# Patient Record
Sex: Male | Born: 1935 | Race: White | Hispanic: No | Marital: Married | Smoking: Never smoker
Health system: Southern US, Community
[De-identification: ages and names within clinical notes are randomized; demographics above are authoritative.]

## PROBLEM LIST (undated history)

## (undated) DIAGNOSIS — H409 Unspecified glaucoma: Secondary | ICD-10-CM

## (undated) DIAGNOSIS — I10 Essential (primary) hypertension: Secondary | ICD-10-CM

## (undated) DIAGNOSIS — K449 Diaphragmatic hernia without obstruction or gangrene: Secondary | ICD-10-CM

## (undated) DIAGNOSIS — C4441 Basal cell carcinoma of skin of scalp and neck: Secondary | ICD-10-CM

## (undated) DIAGNOSIS — I2510 Atherosclerotic heart disease of native coronary artery without angina pectoris: Secondary | ICD-10-CM

## (undated) DIAGNOSIS — C9110 Chronic lymphocytic leukemia of B-cell type not having achieved remission: Secondary | ICD-10-CM

## (undated) DIAGNOSIS — Z800 Family history of malignant neoplasm of digestive organs: Secondary | ICD-10-CM

## (undated) DIAGNOSIS — Z862 Personal history of diseases of the blood and blood-forming organs and certain disorders involving the immune mechanism: Secondary | ICD-10-CM

## (undated) DIAGNOSIS — K635 Polyp of colon: Secondary | ICD-10-CM

## (undated) DIAGNOSIS — F4310 Post-traumatic stress disorder, unspecified: Secondary | ICD-10-CM

## (undated) DIAGNOSIS — M1990 Unspecified osteoarthritis, unspecified site: Secondary | ICD-10-CM

## (undated) DIAGNOSIS — M21371 Foot drop, right foot: Secondary | ICD-10-CM

## (undated) DIAGNOSIS — Z8042 Family history of malignant neoplasm of prostate: Secondary | ICD-10-CM

## (undated) DIAGNOSIS — I219 Acute myocardial infarction, unspecified: Secondary | ICD-10-CM

## (undated) DIAGNOSIS — K828 Other specified diseases of gallbladder: Secondary | ICD-10-CM

## (undated) DIAGNOSIS — Z77098 Contact with and (suspected) exposure to other hazardous, chiefly nonmedicinal, chemicals: Secondary | ICD-10-CM

## (undated) DIAGNOSIS — E611 Iron deficiency: Secondary | ICD-10-CM

## (undated) DIAGNOSIS — C189 Malignant neoplasm of colon, unspecified: Secondary | ICD-10-CM

## (undated) DIAGNOSIS — K219 Gastro-esophageal reflux disease without esophagitis: Secondary | ICD-10-CM

## (undated) DIAGNOSIS — D649 Anemia, unspecified: Secondary | ICD-10-CM

## (undated) DIAGNOSIS — IMO0002 Reserved for concepts with insufficient information to code with codable children: Secondary | ICD-10-CM

## (undated) DIAGNOSIS — L719 Rosacea, unspecified: Secondary | ICD-10-CM

## (undated) DIAGNOSIS — K627 Radiation proctitis: Secondary | ICD-10-CM

## (undated) DIAGNOSIS — C61 Malignant neoplasm of prostate: Secondary | ICD-10-CM

## (undated) DIAGNOSIS — E538 Deficiency of other specified B group vitamins: Secondary | ICD-10-CM

## (undated) DIAGNOSIS — K3184 Gastroparesis: Secondary | ICD-10-CM

## (undated) DIAGNOSIS — R002 Palpitations: Secondary | ICD-10-CM

## (undated) DIAGNOSIS — R269 Unspecified abnormalities of gait and mobility: Secondary | ICD-10-CM

## (undated) DIAGNOSIS — Z7739 Contact with and (suspected) exposure to other war theater: Secondary | ICD-10-CM

## (undated) DIAGNOSIS — G629 Polyneuropathy, unspecified: Secondary | ICD-10-CM

## (undated) DIAGNOSIS — Z87442 Personal history of urinary calculi: Secondary | ICD-10-CM

## (undated) HISTORY — PX: TRANSURETHRAL RESECTION OF PROSTATE: SHX73

## (undated) HISTORY — DX: Personal history of diseases of the blood and blood-forming organs and certain disorders involving the immune mechanism: Z86.2

## (undated) HISTORY — DX: Contact with and (suspected) exposure to other hazardous, chiefly nonmedicinal, chemicals: Z77.098

## (undated) HISTORY — PX: PROSTATECTOMY: SHX69

## (undated) HISTORY — DX: Essential (primary) hypertension: I10

## (undated) HISTORY — DX: Post-traumatic stress disorder, unspecified: F43.10

## (undated) HISTORY — DX: Chronic lymphocytic leukemia of B-cell type not having achieved remission: C91.10

## (undated) HISTORY — PX: KNEE SURGERY: SHX244

## (undated) HISTORY — DX: Family history of malignant neoplasm of prostate: Z80.42

## (undated) HISTORY — DX: Malignant neoplasm of prostate: C61

## (undated) HISTORY — DX: Gastroparesis: K31.84

## (undated) HISTORY — DX: Gastro-esophageal reflux disease without esophagitis: K21.9

## (undated) HISTORY — DX: Other specified diseases of gallbladder: K82.8

## (undated) HISTORY — DX: Diaphragmatic hernia without obstruction or gangrene: K44.9

## (undated) HISTORY — DX: Family history of malignant neoplasm of digestive organs: Z80.0

## (undated) HISTORY — DX: Polyp of colon: K63.5

## (undated) HISTORY — DX: Basal cell carcinoma of skin of scalp and neck: C44.41

## (undated) HISTORY — DX: Deficiency of other specified B group vitamins: E53.8

## (undated) HISTORY — DX: Contact with and (suspected) exposure to other war theater: Z77.39

## (undated) HISTORY — DX: Reserved for concepts with insufficient information to code with codable children: IMO0002

## (undated) HISTORY — DX: Radiation proctitis: K62.7

## (undated) HISTORY — DX: Iron deficiency: E61.1

## (undated) HISTORY — DX: Rosacea, unspecified: L71.9

## (undated) HISTORY — PX: CHOLECYSTECTOMY: SHX55

## (undated) HISTORY — DX: Atherosclerotic heart disease of native coronary artery without angina pectoris: I25.10

## (undated) HISTORY — PX: TONSILLECTOMY: SUR1361

## (undated) HISTORY — PX: CATARACT EXTRACTION: SUR2

## (undated) HISTORY — DX: Palpitations: R00.2

## (undated) HISTORY — DX: Malignant neoplasm of colon, unspecified: C18.9

## (undated) HISTORY — DX: Unspecified osteoarthritis, unspecified site: M19.90

## (undated) HISTORY — DX: Unspecified abnormalities of gait and mobility: R26.9

## (undated) HISTORY — PX: VASECTOMY: SHX75

---
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
## 2003-01-31 ENCOUNTER — Other Ambulatory Visit: Admission: RE | Admit: 2003-01-31 | Discharge: 2003-01-31 | Payer: Self-pay | Admitting: Dermatology

## 2003-11-25 ENCOUNTER — Encounter: Admission: RE | Admit: 2003-11-25 | Discharge: 2003-11-25 | Payer: Self-pay | Admitting: Neurology

## 2004-02-12 ENCOUNTER — Ambulatory Visit (HOSPITAL_COMMUNITY): Admission: RE | Admit: 2004-02-12 | Discharge: 2004-02-12 | Payer: Self-pay | Admitting: Internal Medicine

## 2004-02-12 HISTORY — PX: COLONOSCOPY: SHX174

## 2004-02-12 HISTORY — PX: ESOPHAGOGASTRODUODENOSCOPY: SHX1529

## 2005-04-26 ENCOUNTER — Ambulatory Visit: Payer: Self-pay | Admitting: Internal Medicine

## 2005-04-27 ENCOUNTER — Ambulatory Visit (HOSPITAL_COMMUNITY): Admission: RE | Admit: 2005-04-27 | Discharge: 2005-04-27 | Payer: Self-pay | Admitting: Internal Medicine

## 2005-05-10 ENCOUNTER — Ambulatory Visit (HOSPITAL_COMMUNITY): Admission: RE | Admit: 2005-05-10 | Discharge: 2005-05-10 | Payer: Self-pay | Admitting: Internal Medicine

## 2005-05-25 ENCOUNTER — Ambulatory Visit: Payer: Self-pay | Admitting: Internal Medicine

## 2005-06-24 ENCOUNTER — Ambulatory Visit: Payer: Self-pay | Admitting: Internal Medicine

## 2005-12-20 ENCOUNTER — Ambulatory Visit: Payer: Self-pay | Admitting: Internal Medicine

## 2006-12-30 ENCOUNTER — Ambulatory Visit: Payer: Self-pay | Admitting: Internal Medicine

## 2008-01-17 ENCOUNTER — Ambulatory Visit: Payer: Self-pay | Admitting: Internal Medicine

## 2008-08-30 DIAGNOSIS — K449 Diaphragmatic hernia without obstruction or gangrene: Secondary | ICD-10-CM

## 2008-08-30 DIAGNOSIS — C61 Malignant neoplasm of prostate: Secondary | ICD-10-CM

## 2008-08-30 HISTORY — DX: Malignant neoplasm of prostate: C61

## 2008-08-30 HISTORY — DX: Diaphragmatic hernia without obstruction or gangrene: K44.9

## 2008-10-08 ENCOUNTER — Ambulatory Visit: Payer: Self-pay | Admitting: Internal Medicine

## 2008-10-10 ENCOUNTER — Ambulatory Visit (HOSPITAL_COMMUNITY): Admission: RE | Admit: 2008-10-10 | Discharge: 2008-10-10 | Payer: Self-pay | Admitting: Internal Medicine

## 2008-10-10 ENCOUNTER — Ambulatory Visit: Payer: Self-pay | Admitting: Internal Medicine

## 2008-10-10 HISTORY — PX: ESOPHAGOGASTRODUODENOSCOPY: SHX1529

## 2008-10-17 ENCOUNTER — Telehealth (INDEPENDENT_AMBULATORY_CARE_PROVIDER_SITE_OTHER): Payer: Self-pay | Admitting: *Deleted

## 2008-10-17 ENCOUNTER — Telehealth (INDEPENDENT_AMBULATORY_CARE_PROVIDER_SITE_OTHER): Payer: Self-pay

## 2008-10-22 ENCOUNTER — Ambulatory Visit: Payer: Self-pay | Admitting: Cardiology

## 2008-11-18 ENCOUNTER — Telehealth: Payer: Self-pay | Admitting: Internal Medicine

## 2008-12-18 ENCOUNTER — Ambulatory Visit: Payer: Self-pay | Admitting: Cardiology

## 2008-12-25 ENCOUNTER — Encounter: Payer: Self-pay | Admitting: Internal Medicine

## 2009-04-30 DIAGNOSIS — I4949 Other premature depolarization: Secondary | ICD-10-CM | POA: Insufficient documentation

## 2009-07-03 ENCOUNTER — Ambulatory Visit: Admission: RE | Admit: 2009-07-03 | Discharge: 2009-08-29 | Payer: Self-pay | Admitting: Radiation Oncology

## 2009-08-31 ENCOUNTER — Ambulatory Visit: Admission: RE | Admit: 2009-08-31 | Discharge: 2009-10-03 | Payer: Self-pay | Admitting: Radiation Oncology

## 2010-01-01 ENCOUNTER — Encounter (INDEPENDENT_AMBULATORY_CARE_PROVIDER_SITE_OTHER): Payer: Self-pay | Admitting: *Deleted

## 2010-03-09 DIAGNOSIS — K219 Gastro-esophageal reflux disease without esophagitis: Secondary | ICD-10-CM

## 2010-03-12 ENCOUNTER — Ambulatory Visit: Payer: Self-pay | Admitting: Internal Medicine

## 2010-03-20 ENCOUNTER — Telehealth (INDEPENDENT_AMBULATORY_CARE_PROVIDER_SITE_OTHER): Payer: Self-pay | Admitting: *Deleted

## 2010-06-25 ENCOUNTER — Ambulatory Visit (HOSPITAL_COMMUNITY): Admission: RE | Admit: 2010-06-25 | Discharge: 2010-06-25 | Payer: Self-pay | Admitting: Ophthalmology

## 2010-08-30 DIAGNOSIS — I219 Acute myocardial infarction, unspecified: Secondary | ICD-10-CM

## 2010-08-30 DIAGNOSIS — I2510 Atherosclerotic heart disease of native coronary artery without angina pectoris: Secondary | ICD-10-CM

## 2010-08-30 HISTORY — DX: Atherosclerotic heart disease of native coronary artery without angina pectoris: I25.10

## 2010-08-30 HISTORY — DX: Acute myocardial infarction, unspecified: I21.9

## 2010-09-29 NOTE — Letter (Signed)
Summary: Recall Office Visit  [facility]  [address]   [place], [place] [address]   Phone: [phone]  Fax: [phone]      Jan 01, 2010   David Pugh [address] [place], [place]  [address] 03/02/1935   Dear [name],   According to our records, it is time for you to schedule a follow-up office visit with [place].   At your convenience, please call [phone] to schedule an office visit. If you have any questions, concerns, or feel that this letter is in error, we would appreciate your call.   Sincerely,    Diana Eves  [facility] Ph: [phone]   Fax: [phone]

## 2010-09-29 NOTE — Progress Notes (Signed)
  Phone Note Call from Patient   Reason for Call: Talk to Nurse Summary of Call: Pt called and spoke with Erskine Squibb. He told her that the samples were working well for him and should he continue and have a Rx called in? Erskine Squibb told pt that we would get with RMR and for him to advise to continue or not. Pt said he uses [facility] in [place]. Initial call taken by: Diana Eves,  March 20, 2010 10:47 AM     Appended Document:  pt is taking dexilant samples  Appended Document:  ok; rx Dexilant 60 mg disp 30 or 90  ; onc capsule daily  - refill x 1 year  Appended Document:  rx called to [facility]  Appended Document:  LMOM for pt

## 2010-09-29 NOTE — Assessment & Plan Note (Signed)
Summary: 2 YR FU/GERD/SS   Visit Type:  Follow-up Visit Primary Care Provider:  tapper  Chief Complaint:  2 year follow up- has nausea prior to eating.  History of Present Illness: Patient doing well. Typical reflux symptoms well-controlled on Nexium. He has developed intermittent symptoms of hunger pangs and knawing epigastric pain relieved by eating lunch or supper. Hasn't had any nausea or vomiting. No dysphagia. No melena or rectal bleeding. EGD February 2010 small hiatal hernia. Gallbladder is out-biliary dyskinesia. Colonoscopy 2005 inflammatory polyps. Due for routine screening 2015. He has gained 16 pounds since February 2010. History of prostate cancer GERD status post TURP. Radiation therapy almost completed down at [facility].  Patient does take Meloxicam daily for osteoarthritis.  Current Medications (verified): 1)  Nexium 40 Mg Cpdr (Esomeprazole Magnesium) .... Take 1 Tablet By Mouth Once A Day 2)  Losartan Potassium-Hctz 100-25 Mg Tabs (Losartan Potassium-Hctz) .... Take 1 Tablet By Mouth Once A Day 3)  Daily Multiple Vitamins  Tabs (Multiple Vitamin) .... Take 1 Tablet By Mouth Once A Day 4)  Meloxicam 15 Mg Tabs (Meloxicam) .... Take 1 Tablet By Mouth Once A Day 5)  Xanax 0.5 Mg Tabs (Alprazolam) .... As Needed 6)  Metoprolol Tartrate 50 Mg Tabs (Metoprolol Tartrate) .... Two Times A Day 7)  Citalopram Hydrobromide 10 Mg Tabs (Citalopram Hydrobromide) .... Once Daily 8)  Xalatan 0.005 % Soln (Latanoprost) .... Once Daily 9)  B Complex With Folic Acid and Vit C .... Once Daily 10)  Aspirin 81 Mg Tbec (Aspirin) .... Once Daily  Allergies (verified): 1)  ! Sulfa  Past History:  Past Surgical History: Last updated: 04/30/2009 Back Surgery Cholecystectomy Prostatectomy Tonsillectomy  Family History: Last updated: 2010/03/21 Father: deceased- throat cancer, hx of MI Mother: deceased- old age Siblings: brother- cancer-prostate No FH of Colon Cancer:  Social  History: Last updated: 04/30/2009 Retired  Married  Tobacco Use - No.  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 Conjunctiva are pink Lungs:  clear to auscultation Heart:  regular rate rhythm without murmur gallop rub Abdomen:  flat positive bowel sounds soft nontender without appreciable mass or organomegaly.  Impression & Recommendations: Impression: 76 y/o gentleman with well-controlled GERD on Nexium now with intermittent hunger pains some vague dyspeptic symptoms relieved with eating. He does take nonsteroidals daily. I do not detect any alarm symptoms at this time.  Recommendations: Stop Nexium; course of Dexalant 60 mg orally daily for the next 2 weeks one before breakfast daily. He is to let us know how is working form in 2 weeks and we will go from there.  May need a drug holiday off meloxicam.  Appended Document: Orders Update  Clinical Lists Changes  Orders: Added new Service order of Est. Patient Level III (30160) - Signed

## 2010-11-11 LAB — HEMOGLOBIN AND HEMATOCRIT, BLOOD: HCT: 40 % (ref 39.0–52.0)

## 2010-11-11 LAB — BASIC METABOLIC PANEL
CO2: 27 mEq/L (ref 19–32)
Calcium: 9 mg/dL (ref 8.4–10.5)
Creatinine, Ser: 1.23 mg/dL (ref 0.4–1.5)
GFR calc Af Amer: >60 mL/min (ref >60)

## 2011-01-12 NOTE — Assessment & Plan Note (Signed)
NAME:  RIYAAN, HEROUX              CHART#:  [id number]   DATE:  01/17/2008                       DOB:  09/11/1934   REASON FOR VISIT:  Annual follow-up of gastroesophageal reflux disease,  history of biliary dyskinesia, last seen Dec 30, 2006.   HISTORY OF PRESENT ILLNESS:  Mr. Disano has done very well since he  was last seen.  Reflux symptoms are now well controlled on Nexium 40  grams orally daily.  He is not having any dysphagia, no melena,  hematochezia.  He is trying to watch his diet.  He has gained 10 pounds,  however, in the past one year.  He has a history of some vague post  prandial upper abdominal pain for which he was worked up with ultrasound  at [facility].  He has a gallbladder EF of 31%, but overall clinically has  done well for the better part of a year and a half, now without any  recurrent symptoms.  Colonoscopy back in 2005 demonstrated colonic  polyps which were removed.  They turned out all to be inflammatory.  He  is slated to have routine screening colonoscopy in 2015.   CURRENT MEDICATIONS:  See updated list.   ALLERGIES:  Sulfa .   PHYSICAL EXAMINATION:  GENERAL:  Mr. Kuennen appears well.  VITAL SIGNS:  Weight 230, height 5 feet 11-1/2 inches,  temperature 95,  blood pressure 120/70, pulse 64.  SKIN:  Warm and dry.  CHEST:  Lungs are clear to auscultation.  HEART:  Regular rate and rhythm without murmur, gallop, rub.  ABDOMEN:  Flat, positive bowel sounds, entirely soft, nontender without  appreciable mass or organomegaly.   ASSESSMENT:  1. Current symptoms well controlled on Nexium.  Overall, he is doing      well.  He has gained 10 pounds.  I reviewed antireflux      lifestyle/diet, and he ought to plan on just continuing Nexium      daily indefinitely as the benefits far outweigh theoretical risks.  2. Biliary dyskinesia, asymptomatic.  No further intervention needed      as long as he is asymptomatic.  3. Colorectal cancer screening.  He will be  due for a screening      examination in 2015.  Unless something comes up, plan to see this      nice gentleman back in 2 years.       Jonathon Bellows, M.D.  Electronically Signed     RMR/MEDQ  D:  01/17/2008  T:  01/17/2008  Job:  [id number]

## 2011-01-12 NOTE — Op Note (Signed)
NAME:  David, Pugh             ACCOUNT NO.:  [id number]   MEDICAL RECORD NO.:  [id number]          PATIENT TYPE:  AMB   LOCATION:  DAY                           FACILITY:  [facility]   PHYSICIAN:  R. Roetta Sessions, M.D. DATE OF BIRTH:  Oct 30, 1934   DATE OF PROCEDURE:  DATE OF DISCHARGE:                               OPERATIVE REPORT   INDICATIONS FOR PROCEDURE:  A 76 year old gentleman with a history of  biliary dyskinesia (gallbladder remains in situ) who is now having  recurrent symptoms of vague postprandial upper abdominal discomfort and  anorexia recently.  He had similar symptoms in the past, which resolved  with conservative approach and gallbladder ultrasound was negative.  However, HIDA with the gallbladder EF demonstrated diminished  gallbladder ejection fraction of 31%.  Reflux symptoms are now fairly  well controlled on Nexium 40 mg orally daily.  He had some transient  constipation recently, which he states this week has much improved  without any specific therapy.  EGD is now being done to further evaluate  his upper GI tract symptoms.  Risks, benefits, alternatives, and  limitations have been reviewed.  Questions answered.  He is agreeable.  Please see the documentation in the medical record.   PROCEDURE NOTE:  O2 saturation, blood pressure, pulse, and respirations  were monitored throughout the entire procedure.   CONSCIOUS SEDATION:  Versed 4 mg IV and Demerol 100 mg IV in divided  doses.   INSTRUMENT:  Pentax video chip system.   FINDINGS:  Examination of tubular esophagus revealed no mucosal  abnormalities.  EG junction easily traversed.  Stomach:  Gastric cavity  was empty and insufflated well with air.  Thorough examination of the  gastric mucosa including retroflexion of the proximal stomach,  esophagogastric junction demonstrated only a tiny hiatal hernia.  Pylorus was patent and easily traversed.  Examination of the bulb and  second portion revealed no  abnormalities.   THERAPEUTIC/DIAGNOSTIC MANEUVERS PERFORMED:  None.   The patient tolerated the procedure well and was reactive to Endoscopy.   IMPRESSION:  Normal esophagus, tiny hiatal hernia, otherwise normal  stomach, D1 and D2.   I suspect the patient's upper gastrointestinal tract symptoms are most  likely related to his gallbladder.  At this point, I feel no further  diagnostic studies are warranted.  Again, I have recommended Mr.  Egnor go ahead and seek out surgical consult.  Additionally, I told  him that he may will get further studies, since it has been a good 3  years since his HIDA was last done, but we will defer it to surgical  consultant.   [name] tells me he wants to be referred over to [facility] surgery  and we will take the liberty of getting the ball moved in that  direction.      Jonathon Bellows, M.D.  Electronically Signed     RMR/MEDQ  D:  10/10/2008  T:  10/11/2008  Job:  [id number]   cc:   Wyvonnia Lora  Fax: [phone]   [facility]  [place], [place]

## 2011-01-12 NOTE — H&P (Signed)
NAME:  David Pugh, David Pugh             ACCOUNT NO.:  [id number]   MEDICAL RECORD NO.:  [id number]          PATIENT TYPE:  AMB   LOCATION:  DAY                           FACILITY:  [facility]   PHYSICIAN:  R. Roetta Sessions, M.D. DATE OF BIRTH:  [birth year]   DATE OF ADMISSION:  DATE OF DISCHARGE:  LH                              HISTORY & PHYSICAL   CHIEF COMPLAINT:  Early satiety, upper abdominal pain, weight loss.   HISTORY OF PRESENT ILLNESS:  David Pugh is a pleasant 76-year-  old Caucasian male with a history of known biliary dyskinesia and  gastroesophageal reflux disease, symptoms of have been well controlled.  Over the last couple of years, he is taking Nexium with very good  management of his reflux symptoms.  He tells me over the past 3 months,  he started having some vague symptoms of anorexia getting full early,  upper abdominal discomfort after he eats particularly salads including  lettuce.  He has a notable difficult time after he eats lettuce.  He has  not had fever or chills.  He has had some nocturnal symptoms that he  describes as reflux/regurgitation.  He has also been more constipated  recently.   These symptoms have been associated with a 13-pound weight loss in the  past 8 weeks.  He tells me he leaves off his evening meal.  He does not  feel very well and also he has developed relative constipation during  this period of time.  His stool frequency has fallen off and sometimes  he has to strain, although, he denies melena or hematochezia.  Of note,  he has also been taking meloxicam for arthritis over the last few years.   He has no odynophagia or dysphagia.  He has had some vague upper  abdominal discomfort in years past which has been evaluated with  abdominal ultrasound which reveals normal gall gallbladder biliary tree,  some increased echogenicity of the liver consistent with fatty  infiltration.  HIDA with CCK challenge demonstrated diminished  gallbladder, EF of 31.2% back in 2006, but this was not associated with  any symptoms, whatsoever.   Past medical history is significant for hypertension, seasonal  allergies, fluid retention, gastroesophageal reflux disease, chronic low  back pain.   PAST SURGERIES:  Prostate surgery, BPH, back surgery, tonsillectomy.  He  underwent a screening colonoscopy back in June 2005.  He had multiple  polyps removed as well as diverticulosis all the polyps were  inflammatory and he is due for followup colonoscopy in 2015.   CURRENT MEDICATIONS:  1. Meloxicam 15 mg daily.  2. Atenolol 25 mg daily.  3. Hyzaar 100/25 daily.  4. Nexium 40 mg daily.  5. Centrum Silver daily.  6. Trazodone 50 mg daily.  7. Liquid red balm applied to his maxillary area daily to prevent      sinusitis and colds.   ALLERGIES:  SULFA.   FAMILY HISTORY:  Negative chronic GI or liver illness.  Father succumbed  to throat cancer, he was a heavy smoker.   SOCIAL HISTORY:  The patient had been married for  45 years, has 3 sons.  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  No tobacco or alcohol.   REVIEW OF SYSTEMS:  No recent chest pain, dyspnea on exertion, fever,  chills.  He has lost weight as outlined above, otherwise as in history  of present illness.   PHYSICAL EXAMINATION:  GENERAL:  A pleasant 76 year old gentleman who  appears to be in no acute distress at this time.  VITAL SIGNS:  Weight is 219 he was 232 when he last weighed in Jan 17, 2008, height 5 feet 11-1/2 inches, temp 98.6, BP 110/70, pulse 60.  SKIN:  Warm and dry.  No jaundice.  No cutaneous stigmata of chronic  liver disease.  HEENT:  No scleral icterus.  Conjunctivae are pink.  CHEST:  Lungs are clear to auscultation.  CARDIAC:  Regular rate and rhythm without murmur, gallop, or rub.  ABDOMEN:  Nondistended.  Positive bowel sounds, soft, nontender, and  nontender to deep palpation.  No appreciable mass  or hepatosplenomegaly.  EXTREMITIES:  No edema.   IMPRESSION:  David Pugh is a very pleasant 76 year old  gentleman with longstanding well-controlled gastroesophageal reflux  disease symptoms.  He has now had some vague postprandial upper  abdominal discomfort with episodes of regurgitation in some early  satiety/anorexia associated with 13-pound weight loss.   His symptoms now are not too dissimilar from symptoms back in 2005 and  2006, which I suspect are ultimately going to be found to be related to  his gallbladder.  However, given his significant early satiety with  longstanding nonsteroidal agent use and weight loss, I feel I firstly  need to reevaluate his upper GI tract prior to considering gallbladder  disease further.   His change in bowel habits are most likely related to his significantly  diminished oral intake over the last several weeks.   RECOMMENDATIONS:  We will proceed with the diagnostic EGD as soon as Pugh  be arranged unless significant pathology is found.  I think there is no  information clinically to make a surgical referral for cholecystectomy  for bowel function, and I have recommended try some MiraLax 1 capful in  8 ounces of water at bedtime p.r.n. constipation with further  recommendations in the very near future.      Jonathon Bellows, M.D.  Electronically Signed    RMR/MEDQ  D:  10/08/2008  T:  10/09/2008  Job:  [id number]   cc:   Wyvonnia Lora  Fax: [phone]

## 2011-01-12 NOTE — Assessment & Plan Note (Signed)
[facility]                          [facility] OFFICE NOTE   NAME:David Pugh, David Pugh                    MRN:          [id number]  DATE:12/18/2008                            DOB:          1934-11-10    PRIMARY CARE PHYSICIAN:  Dr. Wyvonnia Lora   REASON FOR VISIT:  Post hospital followup.   HISTORY OF PRESENT ILLNESS:  David Pugh was seen as an inpatient  consult back in February of this year.  At that time, he had undergone  an elective laparoscopic cholecystectomy by Dr. Sherin Quarry and was noted  to have premature ventricular complexes in the perioperative setting.  These were not symptom provoking and by telemetry monitoring over night,  he did not exhibit any sustained dysrhythmias.  Echocardiography was  reassuring showing a normal left ventricular ejection fraction of 55-60%  with diastolic dysfunction, mild left atrial enlargement, aortic valve  sclerosis without stenosis, trace mitral regurgitation and normal right  ventricular systolic pressure.  No pericardial effusion was noted.  [name] did not describe any clear exertional symptoms of dizziness or  syncope at that point and no further investigation was undertaken from a  cardiac perspective.  My understanding is that since that time he has  undergone a TURP in March, and was subsequently diagnosed with prostate  cancer.  He has been suffering from a significant amount of anxiety  related to this and is being considered for the possibility of surgery  versus radiation therapy over the next several months.  From a cardiac  perspective, he denies any sense of palpitations, dizziness, or syncope.  Prior to January, he was exercising regularly without any major  limitations.  He has not been regular since then, but for a brief period  of time in March had gotten back to doing his elliptical machine and sit-  ups without angina or dyspnea on exertion.   ALLERGIES:  SULFA DRUGS.   PRESENT MEDICATIONS:  1. Nexium 40 mg p.o. daily.  2. Potassium 10 mEq p.o. daily.  3. Losartan/hydrochlorothiazide 100/25 mg p.o. daily.  4. Multivitamin one p.o. daily.  5. Atenolol 25 mg p.o. daily.  6. Meloxicam 50 mg p.o. daily.  7. Xanax 3.5 mg p.o. p.r.n.   REVIEW OF SYSTEMS:  As outlined above.  Negative.   PHYSICAL EXAMINATION:  VITAL SIGNS:  Blood pressure is 150/77, heart  rate is 67, weight is 207 pounds.  GENERAL:  This is an overweight male in no acute distress.  HEENT:  Conjunctiva is normal.  Oropharynx clear.  NECK:  Supple.  No elevated jugular venous pressure, no bruits.  LUNGS:  Clear, breathing at rest.  CARDIAC:  Regular rate and rhythm.  No S3 gallop or pericardial rub.  ABDOMEN:  Soft, nontender.  Normoactive bowel sounds.  EXTREMITIES:  Exhibit no significant pitting edema.  Distal pulses are  2+.  SKIN:  Warm and dry.  MUSCULOSKELETAL:  No kyphosis noted.  NEUROPSYCHIATRIC:  The patient is alert and oriented x3.  Affect is  appropriate   IMPRESSION AND RECOMMENDATIONS:  Previous documentation of asymptomatic  premature ventricular complexes in the  setting of normal left  ventricular systolic function and no exertional chest pain or limiting  breathlessness.  David Pugh is now on a low-dose potassium supplement  while on losartan/hydrochlorothiazide.  He is not reporting any  palpitations or functional limitation at this point and mainly seems to  have had difficulty with anxiety recently in the wake of his prostate  cancer diagnosis.  At this point, I have recommended that he reinitiate  a basic aerobic exercise regiment, perhaps through walking or use of his  elliptical machine.  He will continue on low-dose beta-blocker therapy  as well.  No further ischemic testing to be pursued at this point unless  he manifests any progressive symptoms.  We will plan to see him back for  observation over the next 6 months.     Jonelle Sidle, MD   Electronically Signed    SGM/MedQ  DD: 12/18/2008  DT: 12/19/2008  Job #: [id number]   cc:   Wyvonnia Lora

## 2011-01-15 NOTE — Op Note (Signed)
NAME:  David Pugh, David Pugh                       ACCOUNT NO.:  [id number]   MEDICAL RECORD NO.:  [id number]                   PATIENT TYPE:  AMB   LOCATION:  DAY                                  FACILITY:  [facility]   PHYSICIAN:  R. Roetta Sessions, M.D.              DATE OF BIRTH:  [birth year]   DATE OF PROCEDURE:  02/12/2004  DATE OF DISCHARGE:                                 OPERATIVE REPORT   PROCEDURE:  Esophagogastroduodenoscopy and colonoscopy and snare  polypectomy.   INDICATIONS FOR PROCEDURE:  The patient is a 76 year old gentleman who has  never had a colonoscopy who has had unbreakable reflux symptoms after being  on Aciphex 20 mg a day for 10 years.  He has had a sensation of some  fullness below his Adam's apple but really has not had any true esophageal  dysphagia or odynophagia.  EGD and colonoscopy are done.  This approach has  been discussed with the patient at length.  Potential risks, benefits, and  alternatives have been reviewed, questions answered, he is agreeable.  Please see my H&P.   PROCEDURE NOTE:  O2 saturation, blood pressure, pulse and respirations were  monitored throughout the entirety of both procedures.   CONSCIOUS SEDATION:  Versed 5 mg IV, Demerol 125 mg IV.  Cetacaine spray for  topical pharyngeal anesthesia.   ESOPHAGOGASTRODUODENOSCOPY FINDINGS:  Examination of the tubular esophagus  revealed no mucosal abnormalities.  The EG junction easily traversed and the  stomach and gastric cavity was empty and insufflated well with air.  Thorough examination of the gastric mucosa including retroflexed view of the  proximal stomach, esophagogastric junction demonstrated no abnormalities.  The pylorus was patent and easily traversed.  Examination of the first and  second portion revealed no abnormalities.  No therapeutic/diagnostic  maneuvers:  None.  The patient tolerated the procedure well and was prepared  for colonoscopy.   Digital rectal exam revealed no  abnormalities.   COLONOSCOPIC FINDINGS:  The prep was good.  Rectum:  Examination of the  rectal mucosa including a retroflexed view of the anal verge revealed no  abnormalities.  Colon:  The colonic mucosa was surveyed from the  rectosigmoid junction through the left transverse and right colon to the  area of the appendiceal orifice and ileocecal valve and cecum.  These  structures were well seen and photographed for the record.  From this level,  the scope was slowly withdrawn.  All previously mentioned mucosal surfaces  were again seen.  The patient was then noted to have the following  abnormalities.  1. Left-sided diverticular.  2. An 8 mm donut on its side shaped polyp adjacent to the cecum.  This     lesion was raised away from the colonic wall nicely with saline.  The     first 0.5 cc injection turned the adjacent mucosa somewhat white.  I     learned that I was given  half-normal saline instead of normal saline.  We     switched out to normal saline for an additional 3 cc of submucosal     injection.  This was done without difficulty or apparent complication.     Subsequently, using snare cautery, this lesion was resected with one     pass.  There was a slight amount of oozing which was treated with 2 cc of     dilute epinephrine injected submucosally.  3. A single 7 mm polyp on a stalk just distal to the ileocecal valve removed     with hot snare.  There were three 5 mm polyps in the mid right colon,     either cold or hot snared.  Fragments were recovered.  The remainder of     the colonic mucosa appeared normal.  The patient tolerated both     procedures, was reactive at endoscopy.   IMPRESSION:  1. Normal esophagogastroduodenoscopy.  2. Normal rectum.  The colonoscopy findings:  Normal rectum, left-sided     diverticula.  A sessile polyp in the cecum removed as described above.  A     second polyp just distal to the ileocecal valve removed.  Multiple     smaller polyps in  the mid and right descending colon removed.  Otherwise     normal colonoscopy.   RECOMMENDATIONS:  1. Stop Aciphex, begin Prevacid 30 mg orally daily, antireflux literature     provided to Mr. Tsang.  2. He is admonished not to take any Advil or aspirin-type products for the     next 10 days.  3. Diverticulosis literature provided to Mr. Ruben.  4. Follow up on pathology.  5. Further recommendations to follow.      ___________________________________________                                            Jonathon Bellows, M.D.   RMR/MEDQ  D:  02/12/2004  T:  02/12/2004  Job:  [id number]   cc:   Wyvonnia Lora  [address]  [place]  [place] [address]  Fax: [phone]   R. Roetta Sessions, M.D.  [address]  [place]  [place] [address]  Fax: [phone]

## 2011-01-29 HISTORY — PX: CORONARY STENT PLACEMENT: SHX1402

## 2011-02-12 ENCOUNTER — Inpatient Hospital Stay (HOSPITAL_COMMUNITY)
Admission: AD | Admit: 2011-02-12 | Discharge: 2011-02-16 | DRG: 247 | Disposition: A | Discharge status: Home / Self Care | Payer: Medicare Other | Source: Other Acute Inpatient Hospital | Attending: Cardiovascular Disease | Admitting: Cardiovascular Disease

## 2011-02-12 DIAGNOSIS — K219 Gastro-esophageal reflux disease without esophagitis: Secondary | ICD-10-CM | POA: Diagnosis present

## 2011-02-12 DIAGNOSIS — Z882 Allergy status to sulfonamides status: Secondary | ICD-10-CM

## 2011-02-12 DIAGNOSIS — I200 Unstable angina: Principal | ICD-10-CM | POA: Diagnosis present

## 2011-02-12 DIAGNOSIS — E785 Hyperlipidemia, unspecified: Secondary | ICD-10-CM | POA: Diagnosis present

## 2011-02-12 DIAGNOSIS — D72829 Elevated white blood cell count, unspecified: Secondary | ICD-10-CM | POA: Diagnosis present

## 2011-02-12 DIAGNOSIS — Z8546 Personal history of malignant neoplasm of prostate: Secondary | ICD-10-CM

## 2011-02-12 DIAGNOSIS — I10 Essential (primary) hypertension: Secondary | ICD-10-CM | POA: Diagnosis present

## 2011-02-12 LAB — COMPREHENSIVE METABOLIC PANEL
Alkaline Phosphatase: 60 U/L (ref 39–117)
BUN: 17 mg/dL (ref 6–23)
CO2: 28 mEq/L (ref 19–32)
Chloride: 105 mEq/L (ref 96–112)
GFR calc Af Amer: >60 mL/min (ref >60)
GFR calc non Af Amer: 59 mL/min — ABNORMAL LOW (ref >60)
Glucose, Bld: 109 mg/dL — ABNORMAL HIGH (ref 70–99)
Potassium: 4.3 mEq/L (ref 3.5–5.1)
Total Bilirubin: 0.2 mg/dL — ABNORMAL LOW (ref 0.3–1.2)
Total Protein: 5.8 g/dL — ABNORMAL LOW (ref 6.0–8.3)

## 2011-02-12 LAB — CBC
HCT: 38.2 % — ABNORMAL LOW (ref 39.0–52.0)
Hemoglobin: 13.1 g/dL (ref 13.0–17.0)
MCH: 30.8 pg (ref 26.0–34.0)
MCHC: 34.3 g/dL (ref 30.0–36.0)
RBC: 4.25 MIL/uL (ref 4.22–5.81)

## 2011-02-12 LAB — CARDIAC PANEL(CRET KIN+CKTOT+MB+TROPI): Total CK: 83 U/L (ref 7–232)

## 2011-02-13 HISTORY — PX: CARDIAC CATHETERIZATION: SHX172

## 2011-02-13 LAB — URINE MICROSCOPIC-ADD ON

## 2011-02-13 LAB — HEPARIN LEVEL (UNFRACTIONATED)
Heparin Unfractionated: 0.28 IU/mL — ABNORMAL LOW (ref 0.30–0.70)
Heparin Unfractionated: 0.45 IU/mL (ref 0.30–0.70)

## 2011-02-13 LAB — HEMOGLOBIN A1C: Mean Plasma Glucose: 123 mg/dL — ABNORMAL HIGH (ref <117)

## 2011-02-13 LAB — DIFFERENTIAL
Basophils Relative: 1 % (ref 0–1)
Eosinophils Relative: 7 % — ABNORMAL HIGH (ref 0–5)
Lymphs Abs: 3.7 10*3/uL (ref 0.7–4.0)
Monocytes Relative: 9 % (ref 3–12)
Neutro Abs: 6.5 10*3/uL (ref 1.7–7.7)

## 2011-02-13 LAB — LIPID PANEL
HDL: 52 mg/dL (ref >39)
LDL Cholesterol: 85 mg/dL (ref 0–99)
Triglycerides: 100 mg/dL (ref <150)
VLDL: 20 mg/dL (ref 0–40)

## 2011-02-13 LAB — BASIC METABOLIC PANEL
BUN: 16 mg/dL (ref 6–23)
CO2: 28 mEq/L (ref 19–32)
GFR calc non Af Amer: >60 mL/min (ref >60)
Glucose, Bld: 101 mg/dL — ABNORMAL HIGH (ref 70–99)
Potassium: 3.7 mEq/L (ref 3.5–5.1)
Sodium: 140 mEq/L (ref 135–145)

## 2011-02-13 LAB — URINALYSIS, ROUTINE W REFLEX MICROSCOPIC
Bilirubin Urine: NEGATIVE
Hgb urine dipstick: NEGATIVE
Nitrite: NEGATIVE
Specific Gravity, Urine: 1.025 (ref 1.005–1.030)
pH: 5.5 (ref 5.0–8.0)

## 2011-02-13 LAB — CARDIAC PANEL(CRET KIN+CKTOT+MB+TROPI)
CK, MB: 2.2 ng/mL (ref 0.3–4.0)
Total CK: 71 U/L (ref 7–232)
Troponin I: <0.3 ng/mL (ref <0.30)

## 2011-02-13 LAB — TSH: TSH: 2.985 u[IU]/mL (ref 0.350–4.500)

## 2011-02-14 LAB — HEPARIN LEVEL (UNFRACTIONATED): Heparin Unfractionated: 0.39 IU/mL (ref 0.30–0.70)

## 2011-02-14 LAB — CBC
HCT: 37 % — ABNORMAL LOW (ref 39.0–52.0)
Hemoglobin: 12.4 g/dL — ABNORMAL LOW (ref 13.0–17.0)
MCH: 30.3 pg (ref 26.0–34.0)
RBC: 4.09 MIL/uL — ABNORMAL LOW (ref 4.22–5.81)

## 2011-02-15 LAB — CBC
HCT: 37.5 % — ABNORMAL LOW (ref 39.0–52.0)
Hemoglobin: 12.7 g/dL — ABNORMAL LOW (ref 13.0–17.0)
MCV: 90.1 fL (ref 78.0–100.0)
WBC: 12.8 10*3/uL — ABNORMAL HIGH (ref 4.0–10.5)

## 2011-02-15 LAB — PROTIME-INR
INR: 1.11 (ref 0.00–1.49)
Prothrombin Time: 14.5 seconds (ref 11.6–15.2)

## 2011-02-15 LAB — POCT ACTIVATED CLOTTING TIME
Activated Clotting Time: 155 seconds
Activated Clotting Time: 155 seconds
Activated Clotting Time: 237 seconds

## 2011-02-15 LAB — BASIC METABOLIC PANEL
CO2: 29 mEq/L (ref 19–32)
Chloride: 103 mEq/L (ref 96–112)
GFR calc Af Amer: >60 mL/min (ref >60)
Potassium: 3.5 mEq/L (ref 3.5–5.1)
Sodium: 138 mEq/L (ref 135–145)

## 2011-02-16 LAB — CBC
MCV: 88.7 fL (ref 78.0–100.0)
Platelets: 278 10*3/uL (ref 150–400)
RBC: 4.15 MIL/uL — ABNORMAL LOW (ref 4.22–5.81)
RDW: 12.5 % (ref 11.5–15.5)
WBC: 17.1 10*3/uL — ABNORMAL HIGH (ref 4.0–10.5)

## 2011-02-16 LAB — BASIC METABOLIC PANEL
Chloride: 105 mEq/L (ref 96–112)
Creatinine, Ser: 1.16 mg/dL (ref 0.50–1.35)
GFR calc Af Amer: >60 mL/min (ref >60)
Potassium: 3.7 mEq/L (ref 3.5–5.1)
Sodium: 138 mEq/L (ref 135–145)

## 2011-02-16 NOTE — Cardiovascular Report (Signed)
NAME:  DAMONTE, FRIESON NO.:  [id number]  MEDICAL RECORD NO.:  [id number]  LOCATION:  2926                         FACILITY:  [facility]  PHYSICIAN:  Italy Hilty, MD         DATE OF BIRTH:  [birth year]  DATE OF PROCEDURE:  02/15/2011 DATE OF DISCHARGE:                           CARDIAC CATHETERIZATION   OPERATOR:  Italy Hilty, MD  INDICATIONS:  Chest pain, possible unstable angina.  HISTORY OF PRESENT ILLNESS:  Mr. Brucato is a 76 year old gentleman with a history of hypertension, prostate cancer, reflux disease, and peripheral neuropathy; however, no diabetes.  He presented with chest pain which improved somewhat with nitroglycerin and is referred for cardiac catheterization for evaluation of his chest pain.  His EKG was also concerning for some lateral T-wave abnormalities.  PROCEDURE:  The patient was brought to cardiac catheterization lab after informed consent was obtained.  After a procedural time-out and a radiation safety time-out, the patient was sterilely prepped and draped in the usual fashion.  The area around the right radial artery was identified and anesthetized locally with 5 mL of 1% lidocaine. Subsequent to that, the radial artery was accessed with a straight needle and wire, and a 5-French radial access catheter was placed.  The patient was given up to 10 mL a radial cocktail to prevent spasm. Subsequently, the patient underwent heart catheterization with a 5- [place] TIG 4.0 catheter and a pigtail catheter.  The left ventriculogram was performed.  There were no acute complications.  Estimated blood loss was less than 5 mL.  FINDINGS: 1. Left main - calcified, mild disease less than 30%. 2. LAD - diffuse tapering disease to a small vessel distally.  There     was a 95% mid stenosis approximately 2.5 mm caliber.  There was a     90% distal stenosis, however, the vessel is probably 1.5 mm at this     segment.  The LAD was noted to wrap around the  apex. 3. Ramus intermedius - there is a 50-60% tubular proximal stenosis of     the ramus intermedius, approximately 30-40 mm in length. 4. Left circumflex - there is a moderate disease up to 50% and the     vessel tapers distally.  There is a OM1 branch that has a 95%     proximal discrete lesion.  This is a good-size vessel. 5. RCA - there is a 50% midvessel discrete stenosis and a 30% distal     RCA stenosis.  There is additionally a 90% distal PLB stenosis in a     very small branch. 6. LVEDP = 12 mmHg. 7. Left ventriculogram - LVEF >60%, no mitral regurgitation, no wall     motion abnormalities.  IMPRESSION: 1. Severe mid to distal left anterior descending stenosis (the distal     left anterior descending is likely too small for intervention).  2. Severe proximal obtuse marginal 1 disease. 3. 60-70% tubular proximal ramus disease. 4. 90% distal posterolateral branch stenosis again of a small vessel.  PLAN:  I have discussed the findings with Dr. Allyson Sabal and he believes that possible causes of Mr. Montanari chest pain could include the significant mid  LAD stenosis or his OM1 branch.  The tubular proximal disease of the ramus is felt to be a moderate to severe, however, is a long segment and may not be responsible for his symptoms.  Finally, there is distal PLB disease, however, this is terminal of a very small branch and it is unlikely to be responsible for the patient's symptoms. Dr. Allyson Sabal will plan intervention readily the mid LAD and OM1 branch.  We may wish to consider an outpatient stress test if his symptoms recur or do not improve to assess the significance of his ramus disease.     Italy Hilty, MD     CH/MEDQ  D:  02/15/2011  T:  02/16/2011  Job:  [id number]  cc:   Thurmon Fair, MD Wyvonnia Lora, MD  Electronically Signed by Kirtland Bouchard. HILTY M.D. on 02/16/2011 08:14:01 AM

## 2011-02-23 NOTE — Cardiovascular Report (Signed)
  NAME:  CLESTER, CHLEBOWSKI NO.:  [id number]  MEDICAL RECORD NO.:  [id number]  LOCATION:  2926                         FACILITY:  [facility]  PHYSICIAN:  Nanetta Batty, M.D.   DATE OF BIRTH:  1935-01-19  DATE OF PROCEDURE:  02/15/2011 DATE OF DISCHARGE:                           CARDIAC CATHETERIZATION   David Pugh is a 76 year old gentleman with history of hypertension and unstable angina.  He was admitted on February 12, 2011, ruled out for myocardial function.  He was cathed by Dr. Rennis Golden revealing three-vessel disease with distal RCA/PLA disease, circ obtuse marginal branch disease, ramus branch disease, and LAD disease with normal LV function. After reviewing the films, it was decided to proceed with percutaneous intervention of his LAD and ramus intravenous branch with medical treatment of the other lesions.  DESCRIPTION OF PROCEDURE:  The existing 5-French sheath in the right radial artery was exchanged over wire for a 6-French sheath.  The patient received Angiomax bolus with an ACT greater than 200 as well as aspirin and Plavix 600 mg p.o.  A total of 125 mL of contrast was used during the case.  5 mL of radial cocktail was administered to the side- arm sheath.  Visipaque dye was used for the entirety of the case.  200 mcg of intracoronary nitroglycerin was administered during the case.  Using a 6-French XB LAD 3.5 guide catheter along with an 0.14 x 190 Asahi soft wire and 2.0 x 20 Emerge angioplasty balloon, PTCA was performed of the ramus branch using overlapping inflations after the lesion was wired.  Stenting was then performed with a 2.5 x 33 Abbotts Xience V drug-eluting stent at 14-16 atmospheres resulting in reduction of 95% stenosis to 0% residual.  The final diameter was 2.65 mm.  Following this, the wire was withdrawn and placed down the LAD.  The LAD was the lesion, which was in the midportion, was dilated with 2.5 x 12 inversion stented with a 2.5  x 16 PROMUS Element at 14 atmospheres resulting in reduction of 95% stenosis to 0% residual.  The patient tolerated the procedure well.  The guidewire and catheter removed.  The sheath was removed and TR band was placed on the wrist to achieve hemostasis.  The patient left the lab in stable condition.  He will be gently hydrated overnight, treated with aspirin and Plavix and discharged stable in the morning.  He left the lab in a stable condition.     Nanetta Batty, M.D.     JB/MEDQ  D:  02/15/2011  T:  02/16/2011  Job:  [id number]  cc:   Second [facility] Thurmon Fair, MD Wyvonnia Lora, MD Italy Hilty, MD  Electronically Signed by Nanetta Batty M.D. on 02/23/2011 09:23:45 AM

## 2011-02-25 NOTE — H&P (Signed)
NAME:  David Pugh, David Pugh NO.:  [id number]  MEDICAL RECORD NO.:  [id number]  LOCATION:  2926                         FACILITY:  [facility]  PHYSICIAN:  Thurmon Fair, MD     DATE OF BIRTH:  10/18/1934  DATE OF ADMISSION:  02/12/2011 DATE OF DISCHARGE:                             HISTORY & PHYSICAL   CHIEF COMPLAINTS:  Chest pain.  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  Today, he developed midsternal chest pain "like a rock."  He took some Tums at home without relief and in fact his symptoms worsened.  His wife urged him to go to the emergency room.  He presented to the [facility] for further evaluation.  His enzymes are negative.  His EKG showed no acute changes.  He was treated with heparin and nitroglycerin and he was transferred here for further evaluation.  Currently on arrival, he still has a small amount of chest pain and we are titrating his nitroglycerin for this.  He denies any associated nausea, vomiting, or diaphoresis. He denies any shortness of breath.  His pain was not pleuritic.  He denies any radiation to his arm, back, or jaws.  PAST MEDICAL HISTORY:  As noted is remarkable for treated hypertension. He has a history of prostate cancer and had radiation therapy in March 2011.  He has gastroesophageal reflux.  He has a history of peripheral neuropathy, the patient attributes this to a possible agent orange exposure while in [place], but the [place] has not proved this diagnosis for him.  He has had no major operations.  HOME MEDICATIONS: 1. Doxycycline 100 mg b.i.d. 2. Dexilant 60 mg a day. 3. Metoprolol 50 mg b.i.d. 4. Mobic 15 mg a day. 5. Losartan/HCTZ 100/12.5 daily. 6. Citalopram 10 mg a day. 7. Aspirin 81 mg a day. 8. Xalatan ophthalmic drops 0.005%, both eyes at bedtime.  He is allergic to SULFA.  SOCIAL HISTORY:  He has been married for 47 years.  He  never smoked.  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 He has 3 sons.  FAMILY HISTORY:  Remarkable that his father had an MI at 13, although he lived to be 41.  REVIEW OF SYSTEMS:  The patient says that earlier this week, he was bit by a tick and then 48 hours developed a migrating rash.  He was seen by his primary care doctor and put on doxycycline, the rash is now gone. He does have a history of kidney stones but has not been bothered recently with this.  REVIEW OF SYSTEMS:  Otherwise, unremarkable except for noted above.  PHYSICAL EXAMINATION:  VITAL SIGNS:  Blood pressure is 110/78, pulse 65, respirations 12. GENERAL:  He is a well-developed, well-nourished male, in no acute distress. HEENT:  Normocephalic.  Extraocular movement intact.  Sclerae are anicteric.  Lids and conjunctivae are within normal limits. NECK:  Without JVD or bruit. CHEST:  Clear to auscultation and percussion. CARDIAC:  Regular rate and rhythm without murmur, rub, or gallop. Normal S1 and S2. ABDOMEN:  Nontender, nondistended. EXTREMITIES:  Without edema.  There  are no femoral bruits.  Distal pulses are intact. NEUROLOGIC:  Grossly intact.  He is awake, alert, oriented, and cooperative.  Moves all extremities without obvious deficit. SKIN:  Cool and dry.  There is no obvious rash noted.  EKG shows sinus rhythm with T-wave inversion in V2.  No other acute changes.  Labs from [facility] show sodium 140, potassium 4.2, BUN 17, creatinine 1.33.  CK of 107, MB of 2, troponin 0.01, INR 1.0. White count 11.1, hemoglobin 14.4, hematocrit 42.6, platelets 329.  D- dimer was elevated at 0.77, he did not get a chest CT there.  Chest x- ray was also done at [facility] and these results are pending.  IMPRESSION: 1. Unstable angina. 2. Hypertension which is treated. 3. History of prostate cancer, treated with radiation therapy in March     2011. 4. History of  gastroesophageal reflux. 5. History of peripheral neuropathy. 6. Recent tick bite followed by rash, the patient is currently on     doxycycline, this will be continued.  PLAN:  The patient will be put on heparin, nitroglycerin, aspirin, beta- blocker, and a statin will be added.  We will continue his losartan/HCTZ and his doxycycline as well as his PPI.  I should note that the patient has also had problems with bleeding in his left eye, apparently some type of retinal bleeding, he is followed by Dr. Luciana Axe for this and get shots once a month.  At this point, nothing in his history suggests pulmonary embolism, so we will defer a CT scan at this time.     Abelino Derrick, P.A.   ______________________________ Thurmon Fair, MD    LKK/MEDQ  D:  02/12/2011  T:  02/12/2011  Job:  [id number]  cc:   Wyvonnia Lora, MD  Electronically Signed by Corine Shelter P.A. on 02/18/2011 02:14:05 PM Electronically Signed by Thurmon Fair M.D. on 02/25/2011 04:26:03 PM

## 2011-02-25 NOTE — Discharge Summary (Signed)
  NAME:  WESTYN, DRIGGERS NO.:  [id number]  MEDICAL RECORD NO.:  [id number]  LOCATION:  2926                         FACILITY:  [facility]  PHYSICIAN:  Thurmon Fair, MD     DATE OF BIRTH:  01/11/1935  DATE OF ADMISSION:  02/12/2011 DATE OF DISCHARGE:  02/16/2011                              DISCHARGE SUMMARY   DISCHARGE DIAGNOSES: 1. Unstable angina, elective ramus intermedius and left anterior     descending drug-eluting stent placement this admission. 2. Good left ventricular function. 3. Treated hypertension. 4. Treated dyslipidemia. 5. History of prostate cancer, treated with radiation March 2011. 6. Gastroesophageal reflux. 7. History of peripheral neuropathy. 8. History of recent tick bite followed by a rash, the patient was put     on doxycycline by primary care doctor. 9. Leukocytosis with a white count of 17,000 at discharge with     unremarkable differential.  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  He developed a midsternal chest pain and was transferred from [place].  His EKG showed no acute changes.  He had some recurrent discomfort on heparin and nitro over the weekend but eventually this settled down and he was taken to the cath lab on February 15, 2011.  His enzymes were negative.  Catheterization revealed 95% mid LAD, 95% OM-1 and a 50-60% ramus.  Films were reviewed by Dr. Allyson Sabal.  He underwent intervention with Xience stents and a PROMUS stent one to the ramus intermedius and one to the LAD with good final results.  Plan is for outpatient followup and continued medical therapy.  LABORATORY DATA:  At discharge, his white count is 17.1, hemoglobin 12.8, hematocrit 36.8, platelets 278.  Sodium 138, potassium 3.7, BUN 18, creatinine 1.16.  Urinalysis was unremarkable.  TSH 2.98.  CK-MB and troponins are negative.  Cholesterol is 157, HDL 52, LDL 85, hemoglobin A1c was 5.9.  Liver  functions were normal.  EKG showed sinus rhythm without acute changes.  DISPOSITION:  The patient is discharged in stable condition and will follow up with Dr. Royann Shivers as an outpatient.  We have also asked him to follow up with his primary care doctor in [place], I believe he sees Dr. Wyvonnia Lora.  He will need a followup of his white count and follow up with the antibiotic therapy for recent tick bite.     Abelino Derrick, P.A.   ______________________________ Thurmon Fair, MD    LKK/MEDQ  D:  02/16/2011  T:  02/17/2011  Job:  [id number]  cc:   Wyvonnia Lora, MD  Electronically Signed by Corine Shelter P.A. on 02/18/2011 02:14:09 PM Electronically Signed by Thurmon Fair M.D. on 02/25/2011 04:26:07 PM

## 2011-03-04 ENCOUNTER — Encounter: Payer: Self-pay | Admitting: Cardiology

## 2011-04-06 ENCOUNTER — Other Ambulatory Visit: Payer: Self-pay

## 2011-04-06 MED ORDER — DEXLANSOPRAZOLE 60 MG PO CPDR: 60.0000 mg | DELAYED_RELEASE_CAPSULE | Freq: Every day | ORAL | Status: DC

## 2011-04-07 NOTE — Telephone Encounter (Signed)
[facility] pharmacy informed

## 2011-08-31 DIAGNOSIS — E611 Iron deficiency: Secondary | ICD-10-CM

## 2011-08-31 DIAGNOSIS — C189 Malignant neoplasm of colon, unspecified: Secondary | ICD-10-CM

## 2011-08-31 HISTORY — DX: Iron deficiency: E61.1

## 2011-08-31 HISTORY — DX: Malignant neoplasm of colon, unspecified: C18.9

## 2012-01-05 ENCOUNTER — Ambulatory Visit (INDEPENDENT_AMBULATORY_CARE_PROVIDER_SITE_OTHER): Payer: Medicare Other | Admitting: Internal Medicine

## 2012-01-05 VITALS — BP 129/74 | HR 71 | Temp 97.4°F | Ht 72.0 in | Wt 220.4 lb

## 2012-01-05 DIAGNOSIS — R195 Other fecal abnormalities: Secondary | ICD-10-CM | POA: Insufficient documentation

## 2012-01-05 MED ORDER — PEG-KCL-NACL-NASULF-NA ASC-C 100 G PO SOLR: 1.0000 | Freq: Once | ORAL | Status: DC

## 2012-01-05 NOTE — Progress Notes (Signed)
Primary Care Physician:  Dr. Margo Common Primary Gastroenterologist:  Dr. Jena Gauss  Pre-Procedure History & Physical: HPI:  David Pugh is a 77 y.o. male here for further evaluation of Hemoccult-positive stool. Patient is essentially devoid of any GI symptoms aside from well-controlled GERD-: Dexilant and occasional constipation. He's never had any rectal bleeding or melena. No early satiety or recent weight loss. No nausea vomiting or abdominal pain. No family history of colon polyps or colon cancer. His last colonoscopy was done by me in 2005 he was found to have some left-sided diverticulosis in a couple of inflammatory polyps. EGD for weight loss and early Satiety back in 2010 demonstrated only a small hiatal hernia. His weight has been stable. His current medications include Advil, aspirin and Plavix. He recently had coronary stents placed. He is not known to be anemic as far as he knows. He has had some diminished exercise tolerance recently. He was briefly admitted to while [facility] back in March of this year with chest pain not felt to be cardiac in etiology.  Past Medical History  Diagnosis Date  . Premature ventricular contraction   . Prostate cancer   . 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 Radiation Feb 2011    treatment  . Biliary dyskinesia   . GERD (gastroesophageal reflux disease)   . Hiatal hernia 2010    tiny  . HTN (hypertension)   . Rosacea   . PTSD (post-traumatic stress disorder)   . Angina pectoris, unstable   . Diverticula of colon 2010  . Colon polyps     infammatory    Past Surgical History  Procedure Date  . Back surgery   . Cholecystectomy   . Prostatectomy   . Tonsillectomy   . Transurethral resection of prostate   . Esophagogastroduodenoscopy 10/10/2008    Dr. Maceo Pro hiatal hernia, normal esophagus, normal stomach  . Colonoscopy 02/12/2004    Dr. Jena Gauss- L side diverticula, inflammatory  colon polyps  . Esophagogastroduodenoscopy 02/12/2004      Dr. Geni Bers- normal     Prior to Admission medications   Medication Sig Start Date End Date Taking? Authorizing Provider  ALPRAZolam Prudy Feeler) 0.5 MG tablet Take 0.5 mg by mouth at bedtime as needed.      Historical Provider, MD  aspirin 81 MG tablet Take 81 mg by mouth daily.      Historical Provider, MD  B Complex-Folic Acid CAPS Take 1 capsule by mouth daily.      Historical Provider, MD  citalopram (CELEXA) 10 MG tablet Take 10 mg by mouth daily.      Historical Provider, MD  esomeprazole (NEXIUM) 40 MG capsule Take 40 mg by mouth daily before breakfast.      Historical Provider, MD  latanoprost (XALATAN) 0.005 % ophthalmic solution 1 drop at bedtime.      Historical Provider, MD  losartan-hydrochlorothiazide (HYZAAR) 100-25 MG per tablet Take 1 tablet by mouth daily.      Historical Provider, MD  meloxicam (MOBIC) 15 MG tablet Take 15 mg by mouth daily.      Historical Provider, MD  metoprolol (LOPRESSOR) 50 MG tablet Take 50 mg by mouth 2 (two) times daily.      Historical Provider, MD  Multiple Vitamin (MULTIVITAMIN) tablet Take 1 tablet by mouth daily.      Historical Provider, MD    Allergies as of 01/05/2012 - reviewed 04/30/2009  Allergen Reaction Noted  . Sulfonamide derivatives  04/30/2009  Family History  Problem Relation Age of Onset  . Throat cancer Father   . Heart attack    . Prostate cancer Brother   . Colon cancer Neg Hx     History   Social History  . Marital Status: Married    Spouse Name: N/A    Number of Children: N/A  . Years of Education: N/A   Occupational History  . Retired    Social History Main Topics  . Smoking status: Never Smoker   . Smokeless tobacco: Not on file  . Alcohol Use: No  . Drug Use: No  . Sexually Active: Not on file   Other Topics Concern  . Not on file   Social History Narrative  . No narrative on file    Review of Systems: See HPI, otherwise negative ROS  Physical Exam: There were no vitals taken for this  visit. General:   Alert,  Well-developed, well-nourished, pleasant and cooperative in NAD. Patient is accompanied by his wife Skin:  Intact without significant lesions or rashes. Eyes:  Sclera clear, no icterus.   Conjunctiva pink. Ears:  Normal auditory acuity. Nose:  No deformity, discharge,  or lesions. Mouth:  No deformity or lesions. Neck:  Supple; no masses or thyromegaly. No significant cervical adenopathy. Lungs:  Clear throughout to auscultation.   No wheezes, crackles, or rhonchi. No acute distress. Heart:  Regular rate and rhythm; no murmurs, clicks, rubs,  or gallops. Abdomen: Non-distended, normal bowel sounds.  Soft and nontender without appreciable mass or hepatosplenomegaly.  Pulses:  Normal pulses noted. Extremities:  Without clubbing or edema.

## 2012-01-05 NOTE — Patient Instructions (Signed)
We will schedule a colonoscopy and possible EGD to evaluate your hemocult positive stool.

## 2012-01-05 NOTE — Assessment & Plan Note (Signed)
Very pleasant 77 year old gentleman with long-standing GERD well-controlled acid suppression therapy now found to have evidence of occult blood in a stool sample. He does take dual antiplatelet therapy as well as other nonsteroidals. These agents would increase the  chances of GI bleeding from a multitude of origins and could produce bleeding lesions specifically from there use alone. It's been a good 8 years since he last had his colon Imaged.  I agree with Dr. Margo Common, he needs to have another colonoscopy at this time.  Recommendations: I offered David Pugh a diagnostic colonoscopy in the near future. I explained to him that if colonoscopy is unrevealing as to the cause of the Hemoccult-positive stool, I  would then perform a diagnostic EGD in the same setting.  The risks, benefits, limitations, imponderables and alternatives regarding both EGD and colonoscopy have been reviewed with the patient. Questions have been answered. All parties agreeable.   I'd Like to thank Dr. Margo Common for allowing me to see this nice gentleman once again.

## 2012-01-06 ENCOUNTER — Other Ambulatory Visit: Payer: Self-pay | Admitting: Gastroenterology

## 2012-01-06 DIAGNOSIS — K921 Melena: Secondary | ICD-10-CM

## 2012-01-13 ENCOUNTER — Encounter (HOSPITAL_COMMUNITY): Payer: Self-pay | Admitting: Pharmacy Technician

## 2012-01-18 MED ORDER — SODIUM CHLORIDE 0.45 % IV SOLN
Freq: Once | INTRAVENOUS | Status: AC
  Administered 2012-01-19: 20 mL/h via INTRAVENOUS

## 2012-01-19 ENCOUNTER — Ambulatory Visit (HOSPITAL_COMMUNITY)
Admission: RE | Admit: 2012-01-19 | Discharge: 2012-01-19 | Disposition: A | Discharge status: Home / Self Care | Payer: Medicare Other | Source: Ambulatory Visit | Attending: Internal Medicine | Admitting: Internal Medicine

## 2012-01-19 ENCOUNTER — Encounter (HOSPITAL_COMMUNITY)
Admission: RE | Disposition: A | Discharge status: Home / Self Care | Payer: Self-pay | Source: Ambulatory Visit | Attending: Internal Medicine

## 2012-01-19 ENCOUNTER — Encounter (HOSPITAL_COMMUNITY): Payer: Self-pay | Admitting: *Deleted

## 2012-01-19 ENCOUNTER — Telehealth: Payer: Self-pay | Admitting: Gastroenterology

## 2012-01-19 DIAGNOSIS — I10 Essential (primary) hypertension: Secondary | ICD-10-CM | POA: Insufficient documentation

## 2012-01-19 DIAGNOSIS — Z8546 Personal history of malignant neoplasm of prostate: Secondary | ICD-10-CM | POA: Insufficient documentation

## 2012-01-19 DIAGNOSIS — K5730 Diverticulosis of large intestine without perforation or abscess without bleeding: Secondary | ICD-10-CM | POA: Insufficient documentation

## 2012-01-19 DIAGNOSIS — K921 Melena: Secondary | ICD-10-CM

## 2012-01-19 DIAGNOSIS — Z79899 Other long term (current) drug therapy: Secondary | ICD-10-CM | POA: Insufficient documentation

## 2012-01-19 DIAGNOSIS — D126 Benign neoplasm of colon, unspecified: Secondary | ICD-10-CM | POA: Insufficient documentation

## 2012-01-19 DIAGNOSIS — R195 Other fecal abnormalities: Secondary | ICD-10-CM

## 2012-01-19 DIAGNOSIS — C180 Malignant neoplasm of cecum: Secondary | ICD-10-CM

## 2012-01-19 HISTORY — PX: COLONOSCOPY: SHX5424

## 2012-01-19 LAB — COMPREHENSIVE METABOLIC PANEL
CO2: 24 mEq/L (ref 19–32)
Calcium: 9 mg/dL (ref 8.4–10.5)
Creatinine, Ser: 1.2 mg/dL (ref 0.50–1.35)
GFR calc Af Amer: 66 mL/min — ABNORMAL LOW (ref >90)
GFR calc non Af Amer: 57 mL/min — ABNORMAL LOW (ref >90)
Glucose, Bld: 129 mg/dL — ABNORMAL HIGH (ref 70–99)
Sodium: 138 mEq/L (ref 135–145)
Total Protein: 6.3 g/dL (ref 6.0–8.3)

## 2012-01-19 LAB — CBC
Hemoglobin: 10.6 g/dL — ABNORMAL LOW (ref 13.0–17.0)
MCH: 25.9 pg — ABNORMAL LOW (ref 26.0–34.0)
MCHC: 32 g/dL (ref 30.0–36.0)
MCV: 80.9 fL (ref 78.0–100.0)
RBC: 4.09 MIL/uL — ABNORMAL LOW (ref 4.22–5.81)

## 2012-01-19 SURGERY — COLONOSCOPY
Anesthesia: Moderate Sedation

## 2012-01-19 MED ORDER — BUTAMBEN-TETRACAINE-BENZOCAINE 2-2-14 % EX AERO
INHALATION_SPRAY | CUTANEOUS | Status: DC | PRN
  Administered 2012-01-19: 2 via TOPICAL

## 2012-01-19 MED ORDER — MEPERIDINE HCL 100 MG/ML IJ SOLN
INTRAMUSCULAR | Status: AC
  Filled 2012-01-19: qty 2

## 2012-01-19 MED ORDER — MEPERIDINE HCL 100 MG/ML IJ SOLN
INTRAMUSCULAR | Status: DC | PRN
  Administered 2012-01-19: 50 mg via INTRAVENOUS
  Administered 2012-01-19: 25 mg via INTRAVENOUS

## 2012-01-19 MED ORDER — SODIUM CHLORIDE 0.9 % IJ SOLN
INTRAMUSCULAR | Status: DC | PRN
  Administered 2012-01-19: 1.5 mL

## 2012-01-19 MED ORDER — MIDAZOLAM HCL 5 MG/5ML IJ SOLN
INTRAMUSCULAR | Status: AC
  Filled 2012-01-19: qty 10

## 2012-01-19 MED ORDER — STERILE WATER FOR IRRIGATION IR SOLN
Status: DC | PRN
  Administered 2012-01-19: 13:00:00

## 2012-01-19 MED ORDER — MIDAZOLAM HCL 5 MG/5ML IJ SOLN
INTRAMUSCULAR | Status: DC | PRN
  Administered 2012-01-19: 2 mg via INTRAVENOUS
  Administered 2012-01-19: 1 mg via INTRAVENOUS
  Administered 2012-01-19: 2 mg via INTRAVENOUS
  Administered 2012-01-19: 1 mg via INTRAVENOUS

## 2012-01-19 NOTE — Telephone Encounter (Signed)
Pt is scheduled to see Dr Derrell Lolling @ [facility] on 05/23 @ 11:30- spoke with pt's wife regarding this appt

## 2012-01-19 NOTE — Discharge Instructions (Addendum)
Colonoscopy Discharge Instructions  Read the instructions outlined below and refer to this sheet in the next few weeks. These discharge instructions provide you with general information on caring for yourself after you leave the hospital. Your doctor may also give you specific instructions. While your treatment has been planned according to the most current medical practices available, unavoidable complications occasionally occur. If you have any problems or questions after discharge, call Dr. Jena Gauss at [phone]. ACTIVITY  You may resume your regular activity, but move at a slower pace for the next 24 hours.   Take frequent rest periods for the next 24 hours.   Walking will help get rid of the air and reduce the bloated feeling in your belly (abdomen).   No driving for 24 hours (because of the medicine (anesthesia) used during the test).    Do not sign any important legal documents or operate any machinery for 24 hours (because of the anesthesia used during the test).  NUTRITION  Drink plenty of fluids.   You may resume your normal diet as instructed by your doctor.   Begin with a light meal and progress to your normal diet. Heavy or fried foods are harder to digest and may make you feel sick to your stomach (nauseated).   Avoid alcoholic beverages for 24 hours or as instructed.  MEDICATIONS  You may resume your normal medications unless your doctor tells you otherwise.  WHAT YOU CAN EXPECT TODAY  Some feelings of bloating in the abdomen.   Passage of more gas than usual.   Spotting of blood in your stool or on the toilet paper.  IF YOU HAD POLYPS REMOVED DURING THE COLONOSCOPY:  No aspirin products for 7 days or as instructed.   No alcohol for 7 days or as instructed.   Eat a soft diet for the next 24 hours.  FINDING OUT THE RESULTS OF YOUR TEST Not all test results are available during your visit. If your test results are not back during the visit, make an appointment  with your caregiver to find out the results. Do not assume everything is normal if you have not heard from your caregiver or the medical facility. It is important for you to follow up on all of your test results.  SEEK IMMEDIATE MEDICAL ATTENTION IF:  You have more than a spotting of blood in your stool.   Your belly is swollen (abdominal distention).   You are nauseated or vomiting.   You have a temperature over 101.   You have abdominal pain or discomfort that is severe or gets worse throughout the day.     Polyp and diverticulosis information provided.  No Advil or meloxicam x 10 days; may continue aspirin and Plavix  Further recommendations to follow pending review of pathology report. Co-Enzyme Q10 oral dosage forms What is this medicine? CO-ENZYME Q10 (koh EN zahym Q10) is an herbal or dietary supplement. It is promoted to help many disorders including congestive heart failure, certain mitochondrial diseases, migraine, and high blood pressure. The FDA has not approved this supplement for any medical use. This supplement may be used for other purposes; ask your health care provider or pharmacist if you have questions. This medicine may be used for other purposes; ask your health care provider or pharmacist if you have questions. What should I tell my health care provider before I take this medicine? They need to know if you have any of these conditions: -an unusual or allergic reaction to co-enzyme Q10,  other herbs, plants, medicines, foods, dyes, or preservatives -pregnant or trying to get pregnant -breast-feeding How should I use this medicine? Take this medicine by mouth with a glass of water. Follow the directions on the package labeling, or take as directed by your health care professional. You should take this medicine with a high-fat meal. Do not take this medicine more often than directed. Contact your pediatrician regarding the use of this medicine in children. Special  care may be needed. Overdosage: If you think you've taken too much of this medicine contact a poison control center or emergency room at once. Overdosage: If you think you have taken too much of this medicine contact a poison control center or emergency room at once. NOTE: This medicine is only for you. Do not share this medicine with others. What if I miss a dose? If you miss a dose, take it as soon as you can. If it is almost time for your next dose, take only that dose. Do not take double or extra doses. What may interact with this medicine? -medicines for blood pressure -warfarin This list may not describe all possible interactions. Give your health care provider a list of all the medicines, herbs, non-prescription drugs, or dietary supplements you use. Also tell them if you smoke, drink alcohol, or use illegal drugs. Some items may interact with your medicine. What should I watch for while using this medicine? See your doctor if your symptoms do not get better or if they get worse. Herbal or dietary supplements are not regulated like medicines. Rigid quality control standards are not required for dietary supplements. The purity and strength of these products can vary. The safety and effect of this dietary supplement for a certain disease or illness is not well known. This product is not intended to diagnose, treat, cure or prevent any disease. The Food and Drug Administration suggests the following to help consumers protect themselves: -Always read product labels and follow directions. -Natural does not mean a product is safe for humans to take. -Look for products that include USP after the ingredient name. This means that the manufacturer followed the standards of the [place] Pharmacopoeia. -Supplements made or sold by a nationally known food or drug company are more likely to be made under tight controls. You can write to the company for more information about how the product was made. What side  effects may I notice from receiving this medicine? Side effects that you should report to your doctor or health care professional as soon as possible: - allergic reactions like skin rash, itching or hives, swelling of the face, lips, or tongue Side effects that usually do not require medical attention (Report these to your doctor or health care professional if they continue or are bothersome.): -diarrhea -loss of appetite -nausea -trouble sleeping -upset stomach This list may not describe all possible side effects. Call your doctor for medical advice about side effects. You may report side effects to FDA at 1-800-FDA-1088. Where should I keep my medicine? Keep out of the reach of children. Store at room temperature or as directed on the package label. Throw away any unused medicine after the expiration date. NOTE: This sheet is a summary. It may not cover all possible information. If you have questions about this medicine, talk to your doctor, pharmacist, or health care provider.  2012, Elsevier/Gold Standard. (05/21/2008 1:37:16 PM) Colon Polyps A polyp is extra tissue that grows inside your body. Colon polyps grow in the large intestine. The  large intestine, also called the colon, is part of your digestive system. It is a long, hollow tube at the end of your digestive tract where your body makes and stores stool. Most polyps are not dangerous. They are benign. This means they are not cancerous. But over time, some types of polyps can turn into cancer. Polyps that are smaller than a pea are usually not harmful. But larger polyps could someday become or may already be cancerous. To be safe, doctors remove all polyps and test them.  WHO GETS POLYPS? Anyone can get polyps, but certain people are more likely than others. You may have a greater chance of getting polyps if:  You are over 50.   You have had polyps before.   Someone in your family has had polyps.   Someone in your family has had  cancer of the large intestine.   Find out if someone in your family has had polyps. You may also be more likely to get polyps if you:   Eat a lot of fatty foods.   Smoke.   Drink alcohol.   Do not exercise.   Eat too much.  SYMPTOMS  Most small polyps do not cause symptoms. People often do not know they have one until their caregiver finds it during a regular checkup or while testing them for something else. Some people do have symptoms like these:  Bleeding from the anus. You might notice blood on your underwear or on toilet paper after you have had a bowel movement.   Constipation or diarrhea that lasts more than a week.   Blood in the stool. Blood can make stool look black or it can show up as red streaks in the stool.  If you have any of these symptoms, see your caregiver. HOW DOES THE DOCTOR TEST FOR POLYPS? The doctor can use four tests to check for polyps:  Digital rectal exam. The caregiver wears gloves and checks your rectum (the last part of the large intestine) to see if it feels normal. This test would find polyps only in the rectum. Your caregiver may need to do one of the other tests listed below to find polyps higher up in the intestine.   Barium enema. The caregiver puts a liquid called barium into your rectum before taking x-rays of your large intestine. Barium makes your intestine look white in the pictures. Polyps are dark, so they are easy to see.   Sigmoidoscopy. With this test, the caregiver can see inside your large intestine. A thin flexible tube is placed into your rectum. The device is called a sigmoidoscope, which has a light and a tiny video camera in it. The caregiver uses the sigmoidoscope to look at the last third of your large intestine.   Colonoscopy. This test is like sigmoidoscopy, but the caregiver looks at all of the large intestine. It usually requires sedation. This is the most common method for finding and removing polyps.  TREATMENT   The  caregiver will remove the polyp during sigmoidoscopy or colonoscopy. The polyp is then tested for cancer.   If you have had polyps, your caregiver may want you to get tested regularly in the future.  PREVENTION  There is not one sure way to prevent polyps. You might be able to lower your risk of getting them if you:  Eat more fruits and vegetables and less fatty food.   Do not smoke.   Avoid alcohol.   Exercise every day.   Lose weight  if you are overweight.   Eating more calcium and folate can also lower your risk of getting polyps. Some foods that are rich in calcium are milk, cheese, and broccoli. Some foods that are rich in folate are chickpeas, kidney beans, and spinach.   Aspirin might help prevent polyps. Studies are under way.  Document Released: 05/12/2004 Document Revised: 08/05/2011 Document Reviewed: 10/18/2007 [facility] Patient Information 2012 [place], [place]. Diverticulosis Diverticulosis is a common condition that develops when small pouches (diverticula) form in the wall of the colon. The risk of diverticulosis increases with age. It happens more often in people who eat a low-fiber diet. Most individuals with diverticulosis have no symptoms. Those individuals with symptoms usually experience abdominal pain, constipation, or loose stools (diarrhea). HOME CARE INSTRUCTIONS   Increase the amount of fiber in your diet as directed by your caregiver or dietician. This may reduce symptoms of diverticulosis.   Your caregiver may recommend taking a dietary fiber supplement.   Drink at least 6 to 8 glasses of water each day to prevent constipation.   Try not to strain when you have a bowel movement.   Your caregiver may recommend avoiding nuts and seeds to prevent complications, although this is still an uncertain benefit.   Only take over-the-counter or prescription medicines for pain, discomfort, or fever as directed by your caregiver.  FOODS WITH HIGH FIBER CONTENT  INCLUDE:  Fruits. Apple, peach, pear, tangerine, raisins, prunes.   Vegetables. Brussels sprouts, asparagus, broccoli, cabbage, carrot, cauliflower, romaine lettuce, spinach, summer squash, tomato, winter squash, zucchini.   Starchy Vegetables. Baked beans, kidney beans, lima beans, split peas, lentils, potatoes (with skin).   Grains. Whole wheat bread, brown rice, bran flake cereal, plain oatmeal, white rice, shredded wheat, bran muffins.  SEEK IMMEDIATE MEDICAL CARE IF:   You develop increasing pain or severe bloating.   You have an oral temperature above 102 F (38.9 C), not controlled by medicine.   You develop vomiting or bowel movements that are bloody or black.  Document Released: 05/13/2004 Document Revised: 08/05/2011 Document Reviewed: 01/14/2010 [facility] Patient Information 2012 [place], [place].

## 2012-01-19 NOTE — Op Note (Signed)
[facility] [address] [place], [place]  [address]  COLONOSCOPY PROCEDURE REPORT  PATIENT:  David, Pugh  MR#:  [id number] BIRTHDATE:  06-14-1935, 77 yrs. old  GENDER:  male ENDOSCOPIST:  R. Roetta Sessions, MD FACP [facility] REF. BY:  Wyvonnia Lora, M.D. PROCEDURE DATE:  01/19/2012 PROCEDURE:  colonoscopy with biopsy and (saline-assisted) snare polypectomy  INDICATIONS:  Hemoccult-positive stool; no evidence of gross GI bleeding; last colonoscopy 2005  INFORMED CONSENT:  The risks, benefits, alternatives and imponderables including but not limited to bleeding, perforation as well as the possibility of a missed lesion have been reviewed. The potential for biopsy, lesion removal, etc. have also been discussed.  Questions have been answered.  All parties agreeable. Please see the history and physical in the medical record for more information.  MEDICATIONS:  Demerol 75 mg IV and Versed 6 mg IV in divided doses. Cetacaine spray for EGD (not performed)  DESCRIPTION OF PROCEDURE:  After a digital rectal exam was performed, the EC-[id number] ([id number]) colonoscope was advanced from the anus through the rectum and colon to the area of the cecum, ileocecal valve and appendiceal orifice.  The cecum was deeply intubated.  These structures were well-seen and photographed for the record.  From the level of the cecum and ileocecal valve, the scope was slowly and cautiously withdrawn.  The mucosal surfaces were carefully surveyed utilizing scope tip deflection to facilitate fold flattening as needed.  The scope was pulled down into the rectum where a thorough examination  was performed. <<PROCEDUREIMAGES>>  FINDINGS:  Adequate preparation. Prominent friable distal rectal mucosa with impressive neovascular changes consistent with radiation induced injury; Unable to the retroflex because of friability and small rectal vault; rectal mucosal well seen. Scattered sigmoid diverticula ; 9  mm pedunculated polyp in the mid sigmoid colon. 3 descending colon polyps the largest being 8 mm - this appeared to be a flat adenoma. See image 9. 1.5 x 2 cm sessile, ulcerated lesion in the base the cecum between the appendiceal orifice and the ileocecal valve. See image 6.  THERAPEUTIC / DIAGNOSTIC MANEUVERS PERFORMED:   All left colon polyps were either cold or hot snare removed. The flat adenomatous-appearing polyp in the descending segment was lifted away from the colon wall with 1.5 cc normal saline injected submucosally.  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 Multiple colonic polyps                       removed as described above. Sigmoid Diverticulosis 1.5 x 2 cm relatively flat ulcerated lesion in the base of the cecum as described above most likely representing adenocarcinoma of the colon-status post biopsy.  RECOMMENDATIONS:   No treatment of  radiation-induced proctitis indicated at this time since the patient has no symptoms.  Followup on pathology.  Surgery consultation for a right hemicolectomy to be orchestrated in the near future. Labs today CEA, CBC and comprehensive metabolic profile  ______________________________ R. Roetta Sessions, MD Cale[facility] Essex  CC:  Wyvonnia Lora, M.D.  n. eSIGNED:   R. Roetta Sessions at 01/19/2012 02:13 PM  Carlota Raspberry, [phone]

## 2012-01-19 NOTE — H&P (View-Only) (Signed)
Primary Care Physician:  Dr. [name] Primary Gastroenterologist:  Dr. Farid Grigorian  Pre-Procedure History & Physical: HPI:  David Pugh is a 77 y.o. male here for further evaluation of Hemoccult-positive stool. Patient is essentially devoid of any GI symptoms aside from well-controlled GERD-: Dexilant and occasional constipation. He's never had any rectal bleeding or melena. No early satiety or recent weight loss. No nausea vomiting or abdominal pain. No family history of colon polyps or colon cancer. His last colonoscopy was done by me in 2005 he was found to have some left-sided diverticulosis in a couple of inflammatory polyps. EGD for weight loss and early Satiety back in 2010 demonstrated only a small hiatal hernia. His weight has been stable. His current medications include Advil, aspirin and Plavix. He recently had coronary stents placed. He is not known to be anemic as far as he knows. He has had some diminished exercise tolerance recently. He was briefly admitted to while [facility] back in March of this year with chest pain not felt to be cardiac in etiology.  Past Medical History  Diagnosis Date  . Premature ventricular contraction   . Prostate cancer   . 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 Radiation Feb 2011    treatment  . Biliary dyskinesia   . GERD (gastroesophageal reflux disease)   . Hiatal hernia 2010    tiny  . HTN (hypertension)   . Rosacea   . PTSD (post-traumatic stress disorder)   . Angina pectoris, unstable   . Diverticula of colon 2010  . Colon polyps     infammatory    Past Surgical History  Procedure Date  . Back surgery   . Cholecystectomy   . Prostatectomy   . Tonsillectomy   . Transurethral resection of prostate   . Esophagogastroduodenoscopy 10/10/2008    Dr. Maylee Bare-tiny hiatal hernia, normal esophagus, normal stomach  . Colonoscopy 02/12/2004    Dr. Marin Milley- L side diverticula, inflammatory  colon polyps  . Esophagogastroduodenoscopy 02/12/2004      Dr. Rouk- normal     Prior to Admission medications   Medication Sig Start Date End Date Taking? Authorizing Provider  ALPRAZolam (XANAX) 0.5 MG tablet Take 0.5 mg by mouth at bedtime as needed.      Historical Provider, MD  aspirin 81 MG tablet Take 81 mg by mouth daily.      Historical Provider, MD  B Complex-Folic Acid CAPS Take 1 capsule by mouth daily.      Historical Provider, MD  citalopram (CELEXA) 10 MG tablet Take 10 mg by mouth daily.      Historical Provider, MD  esomeprazole (NEXIUM) 40 MG capsule Take 40 mg by mouth daily before breakfast.      Historical Provider, MD  latanoprost (XALATAN) 0.005 % ophthalmic solution 1 drop at bedtime.      Historical Provider, MD  losartan-hydrochlorothiazide (HYZAAR) 100-25 MG per tablet Take 1 tablet by mouth daily.      Historical Provider, MD  meloxicam (MOBIC) 15 MG tablet Take 15 mg by mouth daily.      Historical Provider, MD  metoprolol (LOPRESSOR) 50 MG tablet Take 50 mg by mouth 2 (two) times daily.      Historical Provider, MD  Multiple Vitamin (MULTIVITAMIN) tablet Take 1 tablet by mouth daily.      Historical Provider, MD    Allergies as of 01/05/2012 - reviewed 04/30/2009  Allergen Reaction Noted  . Sulfonamide derivatives  04/30/2009      Family History  Problem Relation Age of Onset  . Throat cancer Father   . Heart attack    . Prostate cancer Brother   . Colon cancer Neg Hx     History   Social History  . Marital Status: Married    Spouse Name: N/A    Number of Children: N/A  . Years of Education: N/A   Occupational History  . Retired    Social History Main Topics  . Smoking status: Never Smoker   . Smokeless tobacco: Not on file  . Alcohol Use: No  . Drug Use: No  . Sexually Active: Not on file   Other Topics Concern  . Not on file   Social History Narrative  . No narrative on file    Review of Systems: See HPI, otherwise negative ROS  Physical Exam: There were no vitals taken for this  visit. General:   Alert,  Well-developed, well-nourished, pleasant and cooperative in NAD. Patient is accompanied by his wife Skin:  Intact without significant lesions or rashes. Eyes:  Sclera clear, no icterus.   Conjunctiva pink. Ears:  Normal auditory acuity. Nose:  No deformity, discharge,  or lesions. Mouth:  No deformity or lesions. Neck:  Supple; no masses or thyromegaly. No significant cervical adenopathy. Lungs:  Clear throughout to auscultation.   No wheezes, crackles, or rhonchi. No acute distress. Heart:  Regular rate and rhythm; no murmurs, clicks, rubs,  or gallops. Abdomen: Non-distended, normal bowel sounds.  Soft and nontender without appreciable mass or hepatosplenomegaly.  Pulses:  Normal pulses noted. Extremities:  Without clubbing or edema.  

## 2012-01-19 NOTE — Interval H&P Note (Signed)
History and Physical Interval Note:  01/19/2012 1:00 PM  David Pugh  has presented today for surgery, with the diagnosis of blood in stool  The various methods of treatment have been discussed with the patient and family. After consideration of risks, benefits and other options for treatment, the patient has consented to  Procedure(s) (LRB): COLONOSCOPY (N/A) as a surgical intervention .  The patients' history has been reviewed, patient examined, no change in status, stable for surgery.  I have reviewed the patients' chart and labs.  Questions were answered to the patient's satisfaction.     Shalisa Mcquade  No change noted after interview with the patient. Per plan if colonoscopy entirely negative, will perform an EGD

## 2012-01-20 ENCOUNTER — Ambulatory Visit (INDEPENDENT_AMBULATORY_CARE_PROVIDER_SITE_OTHER): Payer: Medicare Other | Admitting: General Surgery

## 2012-01-20 VITALS — BP 122/78 | HR 64 | Temp 98.4°F | Resp 14 | Ht 72.0 in | Wt 214.6 lb

## 2012-01-20 DIAGNOSIS — K6389 Other specified diseases of intestine: Secondary | ICD-10-CM

## 2012-01-20 NOTE — Patient Instructions (Signed)
You have a malignant-appearing mass in your cecum, which is part of the right colon. You will need to have an operation to remove that segment of your colon.  There are also polyps on your left colon and we will need to check the pathology report on that before proceeding with your surgery.  You  will be scheduled for a CT scan of the abdomen and pelvis preoperatively.  We will ask your cardiologist to give clearance for [place] to proceed with surgery, and to give [place] clearance to stop your Plavix and aspirin 5 days preop.  You  will be instructed in a colon prep to be taken one day prior to the surgery.    Laparoscopic Colon Resection Laparoscopic colon resection is a relatively new procedure and is not performed in all centers. It may be done to remove a piece of the colon (large intestine) that may be sore and reddened (inflamed). It may be done to remove a portion of bowel that is blocked. The intestine may be blocked because of colon cancer. It is sometimes used to treat diseases of the bowel in which there are multiple small outgrowths from the bowel wall (polyps), which may predispose a person to cancer. LET YOUR CAREGIVER KNOW ABOUT:  Allergies.   Medications taken including herbs, eye drops, over the counter medications, and creams.   Use of steroids (by mouth or creams).   Previous problems with anesthetics or novocaine.   Possibility of pregnancy, if this applies.   History of blood clots (thrombophlebitis).   History of bleeding or blood problems.   Previous surgery.   Other health problems.  RISKS AND COMPLICATIONS Some problems, which occur following this procedure, include:  Infection: A germ starts growing in the wound. This can usually be treated with medicine that kills germs (antibiotics).   Bleeding following surgery may be a complication of almost all surgeries. Your surgeon takes every precaution to keep this from happening.   Damage to other organs may occur.  If damage to other organs or excessive bleeding should occur it may be necessary to convert the laparoscopic procedure into an open abdominal (belly) procedure. This means the surgery is performed by opening the abdomen and performing the surgery under direct vision. Scarring from previous surgeries or disease may also be a cause to change this procedure to an open abdominal operation.   Sometimes a leak can occur in the line where the bowel was sewn together after the portion of bowel was removed.   It is possible for the bowel to become obstructed in the area where it was sewn together. When this happens, it is sometimes necessary to operate again to repair this. This may be accomplished using the laparoscope or opening the abdomen and operating in the usual manner without the laparoscope.  BEFORE THE PROCEDURE You should be present 2 hours prior to your procedure or as instructed.  PROCEDURE  Laparoscopic means a laparoscope (a small pencil sized telescope) is used. You are made to sleep with medicine (anesthetized). Your surgeon inflates your belly (abdomen) with a needle like device (trocar and cannula). The inflation is done with a harmless gas (carbon dioxide). This makes your organs easier to see. The laparoscope is inserted into your abdomen through a small slit (incision) that allows your surgeon to see into the abdomen. Other small instruments, such as probes and operating instruments, are inserted into the abdomen through other small openings (ports). These ports allow the surgeon to perform the operation.  Often surgeons attach a video camera to the laparoscope to enlarge the view. During the procedure the portion of bowel to be removed is taken out through one of the ports. A port may have to be enlarged if the bowel is too large to be removed. In this case a small incision will be made and some times the bowel is reconnected (anastamosis) outside the abdomen. After the procedure, the gas is  released, and your incisions are closed with stitches (sutures). Because these incisions are small (usually less than one-half inch), there is usually minimal discomfort following the procedure. AFTER THE PROCEDURE The recovery time, if there are no problems, is shortened compared to regular surgery. You will rest in a recovery room until you are stable and doing well. Following this, barring other problems you will be allowed to return to your room. Recovery times vary depending on what is found at surgery, the age of the patient, general health, etc. SEEK IMMEDIATE MEDICAL CARE IF:   There is redness, swelling, or increasing pain in the wound area.   Pus is coming from the wound.   An unexplained oral temperature above 102 F (38.9 C) develops or as directed.   You notice a foul smell coming from the wound or dressing.   There is a breaking open of a wound (edges not staying together) after sutures have been removed.   You develop increasing abdominal pain.  Document Released: 11/06/2002 Document Revised: 08/05/2011 Document Reviewed: 09/15/2007 [facility] Patient Information 2012 [place], [place].

## 2012-01-20 NOTE — Progress Notes (Addendum)
Patient ID: David Pugh., male   DOB: [birth year], 77 y.o.   MRN: [id number]  Chief Complaint  Patient presents with  . New Evaluation    New Pt. Eval Cecal Mass    HPI David Pugh. is a 77 y.o. male.  He is referred by Dr. Eula Listen in [place] for management of a malignant mass of the cecum. His primary care physician is Dr. Wyvonnia Lora. His cardiologist is Dr. Rachelle Hora Croitoru.  The patient was recently found to have Hemoccult-positive stools and underwent colonoscopy. Dr. Jena Gauss  performed just yesterday. In the cecum there is a malignant ulcerated mass with rolled edges about 2 cm in diameter. Biopsies are pending but Dr. Jena Gauss states this is obviously a cancer. In the descending and sigmoid colon he removed 3 or 4 benign-appearing polyps. That pathology is pending. 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  ADDENDUM (02/28/2012):  Pathology of a fragment of one of the descending colon polyps shows high-grade dysplasia. I have discussed this with Dr. Roetta Sessions,  and he feels that this area was completely excised. We agreed that the appropriate management plan of the descending colon would be followup colonoscopy in 6 months. We agreed that this did not warrant resection at this time.  The patient is not very symptomatic he doesn't see any blood in his stools. He has chronic diarrhea and irritable bowel syndrome. He doesn't have any abdominal pain.  Labwork reveals CEA at 1.2, liver function tests normal, hemoglobin 10.6.  Significant comorbidities include the prostate cancer, coronary artery disease, status post cardiac cath with 2 drug-eluting stents placed June 2012, on aspirin and Plavix. Hypertension. Cataract surgery. Laparoscopic cholecystectomy 2001.  Family history is negative for GI cancers.  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 HPI  Past Medical History  Diagnosis Date  . Premature ventricular contraction   . Prostate cancer     . 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 Radiation Feb 2011    treatment  . Biliary dyskinesia   . GERD (gastroesophageal reflux disease)   . Hiatal hernia 2010    tiny  . HTN (hypertension)   . Rosacea   . PTSD (post-traumatic stress disorder)   . Angina pectoris, unstable   . Diverticula of colon 2010  . Colon polyps     infammatory  . CAD (coronary artery disease)   . Arthritis   . Exposure to Agent [facility]     Past Surgical History  Procedure Date  . Back surgery   . Cholecystectomy   . Prostatectomy   . Tonsillectomy   . Transurethral resection of prostate   . Esophagogastroduodenoscopy 10/10/2008    Dr. Maceo Pro hiatal hernia, normal esophagus, normal stomach  . Colonoscopy 02/12/2004    Dr. Jena Gauss- L side diverticula, inflammatory  colon polyps  . Esophagogastroduodenoscopy 02/12/2004    Dr. Geni Bers- normal   . Carotid stent 02/14/11  . Cardiac catheterization 02/13/2011    [facility] cardiology  . Coronary stent placement 01/2011    Family History  Problem Relation Age of Onset  . Throat cancer Father   . Cancer Father     Throat  . Heart attack    . Prostate cancer Brother   . Cancer Brother     Prostate and Skin  . Colon cancer Neg Hx     Social History History  Substance Use Topics  . Smoking status: Never Smoker   .  Smokeless tobacco: Not on file  . Alcohol Use: Yes     1 cocktail/week    Allergies  Allergen Reactions  . Shellfish Allergy Anaphylaxis  . Sulfonamide Derivatives Diarrhea    Current Outpatient Prescriptions  Medication Sig Dispense Refill  . aspirin EC 81 MG tablet Take 81 mg by mouth every morning.      . B Complex-Folic Acid CAPS Take 1 capsule by mouth every morning.       . citalopram (CELEXA) 10 MG tablet Take 10 mg by mouth every morning.       . clopidogrel (PLAVIX) 75 MG tablet Take 75 mg by mouth every morning.       Marland Kitchen dexlansoprazole (DEXILANT) 60 MG capsule Take 60 mg by mouth daily before breakfast.       .  ibuprofen (ADVIL,MOTRIN) 200 MG tablet Take 200 mg by mouth 2 (two) times daily.      Marland Kitchen losartan (COZAAR) 50 MG tablet Take 50 mg by mouth every morning.       . metoprolol (LOPRESSOR) 50 MG tablet Take 50 mg by mouth 2 (two) times daily.        . Multiple Vitamins-Minerals (CENTRUM SILVER) tablet Take 1 tablet by mouth every morning.       . nitroGLYCERIN (NITROSTAT) 0.4 MG SL tablet Place 0.4 mg under the tongue every 5 (five) minutes x 3 doses as needed. For chest pain      . olopatadine (PATANOL) 0.1 % ophthalmic solution Place 1 drop into both eyes 2 (two) times daily as needed. For itching      . peg 3350 powder (MOVIPREP) 100 G SOLR Take 1 kit (100 g total) by mouth once. As directed Please purchase 1 Fleets enema to use with the prep  1 kit  0  . pravastatin (PRAVACHOL) 40 MG tablet Take 40 mg by mouth daily.      . rosuvastatin (CRESTOR) 10 MG tablet Take 10 mg by mouth at bedtime.       Marland Kitchen latanoprost (XALATAN) 0.005 % ophthalmic solution Place 1 drop into both eyes at bedtime.        No current facility-administered medications for this visit.   Facility-Administered Medications Ordered in Other Visits  Medication Dose Route Frequency Provider Last Rate Last Dose  . DISCONTD: butamben-tetracaine-benzocaine (CETACAINE) spray    PRN Corbin Ade, MD   2 spray at 01/19/12 1304  . DISCONTD: meperidine (DEMEROL) 100 MG/ML injection           . DISCONTD: meperidine (DEMEROL) injection    PRN Corbin Ade, MD   25 mg at 01/19/12 1307  . DISCONTD: midazolam (VERSED) 5 MG/5ML injection           . DISCONTD: midazolam (VERSED) 5 MG/5ML injection    PRN Corbin Ade, MD   1 mg at 01/19/12 1332  . DISCONTD: simethicone susp in sterile water 1000 mL irrigation    PRN Corbin Ade, MD      . DISCONTD: sodium chloride 0.9 % injection    PRN Corbin Ade, MD   1.5 mL at 01/19/12 1354    Review of Systems Review of Systems  Constitutional: Negative for fever, chills and unexpected  weight change.  HENT: Negative for hearing loss, congestion, sore throat, trouble swallowing and voice change.   Eyes: Negative for visual disturbance.  Respiratory: Negative for cough and wheezing.   Cardiovascular: Negative for chest pain, palpitations and leg swelling.  Gastrointestinal:  Positive for diarrhea. Negative for nausea, vomiting, abdominal pain, constipation, blood in stool, abdominal distention, anal bleeding and rectal pain.       Occas. Reflux symptoms.  Genitourinary: Negative for hematuria and difficulty urinating.  Musculoskeletal: Negative for arthralgias.  Skin: Negative for rash and wound.  Neurological: Negative for seizures, syncope, weakness and headaches.  Hematological: Negative for adenopathy. Does not bruise/bleed easily.  Psychiatric/Behavioral: Negative for confusion.    Blood pressure 122/78, pulse 64, temperature 98.4 F (36.9 C), temperature source Temporal, resp. rate 14, height 6' (1.829 m), weight 214 lb 9.6 oz (97.342 kg).  Physical Exam Physical Exam  Constitutional: He is oriented to person, place, and time. He appears well-developed and well-nourished. No distress.  HENT:  Head: Normocephalic.  Nose: Nose normal.  Mouth/Throat: No oropharyngeal exudate.  Eyes: Conjunctivae and EOM are normal. Pupils are equal, round, and reactive to light. Right eye exhibits no discharge. Left eye exhibits no discharge. No scleral icterus.  Neck: Normal range of motion. Neck supple. No JVD present. No tracheal deviation present. No thyromegaly present.  Cardiovascular: Normal rate, regular rhythm, normal heart sounds and intact distal pulses.   No murmur heard. Pulmonary/Chest: Effort normal and breath sounds normal. No stridor. No respiratory distress. He has no wheezes. He has no rales. He exhibits no tenderness.  Abdominal: Soft. Bowel sounds are normal. He exhibits no distension and no mass. There is no tenderness. There is no rebound and no guarding.        Lap. Chole trocar scars.  Musculoskeletal: Normal range of motion. He exhibits no edema and no tenderness.  Lymphadenopathy:    He has no cervical adenopathy.  Neurological: He is alert and oriented to person, place, and time. He has normal reflexes. Coordination normal.  Skin: Skin is warm and dry. No rash noted. He is not diaphoretic. No erythema. No pallor.  Psychiatric: He has a normal mood and affect. His behavior is normal. Judgment and thought content normal.    Data Reviewed Phone conversation with Dr.Robert Rourk. Colonoscopy report reviewed. Pathology report requested. Cardiac clearance requested. CT scan requested.  Assessment    Malignant-appearing mass of cecum, 2 cm. Regardless of pathology this will be treated as an adenocarcinoma surgically.  3-4 benign-appearing polyps of descending and sigmoid colon, pathology pending  ADDENDUM(02/28/2012):  One of the fragments of one of the polyps in the descending colon showed a focus of high-grade dysplasia. I have discussed this with Dr. Jonathon Bellows, and he feels that this area was completely excised. We have agreed that the most appropriate management of this area would be followup colonoscopy in 6 months.  Coronary artery disease, status post 2 drug-eluting stents placed June 2012, on aspirin and Plavix, stable. Followed by cardiology.  GERD.  Radiation proctitis  History of prostate cancer treated with external beam radiation therapy and hormone injections  Hypertension  Status post laparoscopic cholecystectomy    Plan    Assuming that the polyps of the left colon are benign, we will proceed with planning for a laparoscopic assisted right colectomy, possible open.  CT scan of abdomen and pelvis will be performed preop.  He will be referred back to Dr. Royann Shivers for cardiac clearance, and to ask permission to discontinue aspirin and Plavix 5 days preop.  He will undergo a 24-hour bowel prep preop  I discussed  Risk possibilities of superficial or deep space infection, bleeding, wound healing problems and hernia, injury to adjacent organs, cardiac, pulmonary and thromboembolic problems. He understands these issues. . His questions are answered.  He would like to proceed as soon as possible.       Angelia Mould. Derrell Lolling, M.D., [facility] General and Minimally invasive Surgery Breast and Colorectal Surgery Office:   [phone] Pager:   [phone]  01/20/2012, 1:43 PM

## 2012-01-21 ENCOUNTER — Telehealth (INDEPENDENT_AMBULATORY_CARE_PROVIDER_SITE_OTHER): Payer: Self-pay

## 2012-01-21 ENCOUNTER — Telehealth (INDEPENDENT_AMBULATORY_CARE_PROVIDER_SITE_OTHER): Payer: Self-pay | Admitting: General Surgery

## 2012-01-21 NOTE — Telephone Encounter (Signed)
Pathology report from his colonoscopic biopsies revealed adenocarcinoma of the cecum. Fragments of colon polyps in the descending colon showed tubular adenoma and one fragment shows a focus of high-grade dysplasia. Sigmoid colon polyp showed adenoma but no dysplasia.  I discussed patient's pathology report with him. I think that what needs to be done is a laparoscopic-assisted right colectomy and follow colonoscopy in one year. CT scan is scheduled for May 28.    Angelia Mould. Derrell Lolling, M.D., [facility] General and Minimally invasive Surgery Breast and Colorectal Surgery Office:   [phone] Pager:   [phone]

## 2012-01-21 NOTE — Telephone Encounter (Signed)
error 

## 2012-01-21 NOTE — Telephone Encounter (Signed)
Patient called for pathology results from recent colonoscopy on 01/19/12 @ [facility].  Please call patient @ [phone] or [phone].

## 2012-01-22 ENCOUNTER — Encounter: Payer: Self-pay | Admitting: Internal Medicine

## 2012-01-25 ENCOUNTER — Ambulatory Visit (HOSPITAL_COMMUNITY)
Admission: RE | Admit: 2012-01-25 | Discharge: 2012-01-25 | Disposition: A | Discharge status: Home / Self Care | Payer: Medicare Other | Source: Ambulatory Visit | Attending: General Surgery | Admitting: General Surgery

## 2012-01-25 ENCOUNTER — Encounter (HOSPITAL_COMMUNITY): Payer: Self-pay | Admitting: Internal Medicine

## 2012-01-25 DIAGNOSIS — K6389 Other specified diseases of intestine: Secondary | ICD-10-CM

## 2012-01-25 DIAGNOSIS — R1903 Right lower quadrant abdominal swelling, mass and lump: Secondary | ICD-10-CM | POA: Insufficient documentation

## 2012-01-25 DIAGNOSIS — R933 Abnormal findings on diagnostic imaging of other parts of digestive tract: Secondary | ICD-10-CM | POA: Insufficient documentation

## 2012-01-25 MED ORDER — IOHEXOL 300 MG/ML  SOLN
100.0000 mL | Freq: Once | INTRAMUSCULAR | Status: AC | PRN
  Administered 2012-01-25: 100 mL via INTRAVENOUS

## 2012-01-26 ENCOUNTER — Telehealth (INDEPENDENT_AMBULATORY_CARE_PROVIDER_SITE_OTHER): Payer: Self-pay

## 2012-01-26 NOTE — Telephone Encounter (Signed)
Spoke with David Pugh and gave her CT results.  David Pugh has an appt with his cardiologist, Dr. Royann Shivers on 02/07/12 at 10:00.

## 2012-01-31 ENCOUNTER — Telehealth (INDEPENDENT_AMBULATORY_CARE_PROVIDER_SITE_OTHER): Payer: Self-pay

## 2012-01-31 NOTE — Telephone Encounter (Signed)
Clearance received from Dr Royann Shivers. Orders to surgery scheduling. LMOM to d/c blood thinners 5 days before surgery.

## 2012-02-01 ENCOUNTER — Encounter: Payer: Self-pay | Admitting: Internal Medicine

## 2012-02-16 ENCOUNTER — Encounter (HOSPITAL_COMMUNITY): Payer: Self-pay | Admitting: Pharmacy Technician

## 2012-02-22 ENCOUNTER — Encounter (HOSPITAL_COMMUNITY)
Admission: RE | Admit: 2012-02-22 | Discharge: 2012-02-22 | Disposition: A | Discharge status: Home / Self Care | Payer: Medicare Other | Source: Ambulatory Visit | Attending: General Surgery | Admitting: General Surgery

## 2012-02-22 ENCOUNTER — Encounter (HOSPITAL_COMMUNITY): Payer: Self-pay

## 2012-02-22 HISTORY — DX: Polyneuropathy, unspecified: G62.9

## 2012-02-22 HISTORY — DX: Anemia, unspecified: D64.9

## 2012-02-22 LAB — URINE MICROSCOPIC-ADD ON

## 2012-02-22 LAB — CBC
Hemoglobin: 9.7 g/dL — ABNORMAL LOW (ref 13.0–17.0)
RBC: 4.06 MIL/uL — ABNORMAL LOW (ref 4.22–5.81)
WBC: 10.6 10*3/uL — ABNORMAL HIGH (ref 4.0–10.5)

## 2012-02-22 LAB — COMPREHENSIVE METABOLIC PANEL
BUN: 19 mg/dL (ref 6–23)
Calcium: 9 mg/dL (ref 8.4–10.5)
GFR calc Af Amer: 69 mL/min — ABNORMAL LOW (ref >90)
Glucose, Bld: 98 mg/dL (ref 70–99)
Sodium: 140 mEq/L (ref 135–145)
Total Protein: 6.4 g/dL (ref 6.0–8.3)

## 2012-02-22 LAB — URINALYSIS, ROUTINE W REFLEX MICROSCOPIC
Nitrite: NEGATIVE
Specific Gravity, Urine: 1.027 (ref 1.005–1.030)
pH: 6 (ref 5.0–8.0)

## 2012-02-22 LAB — SURGICAL PCR SCREEN
MRSA, PCR: NEGATIVE
Staphylococcus aureus: NEGATIVE

## 2012-02-22 NOTE — Pre-Procedure Instructions (Signed)
6/13 EKG on chart  CT abd/pelvis on chart done 01/25/12 - on chart  01/31/12 LOV note with Dr Gwenlyn Saran( cardiologist ) on chart  Stress Test 11/24/11 on chart

## 2012-02-22 NOTE — Progress Notes (Signed)
02/22/12 1451  OBSTRUCTIVE SLEEP APNEA  Have you ever been diagnosed with sleep apnea through a sleep study? No  Do you snore loudly (loud enough to be heard through closed doors)?  1  Do you often feel tired, fatigued, or sleepy during the daytime? 0  Has anyone observed you stop breathing during your sleep? 0  Do you have, or are you being treated for high blood pressure? 1  BMI more than 35 kg/m2? 0  Age over 75 years old? 1  Neck circumference greater than 40 cm/18 inches? 0  Gender: 1  Obstructive Sleep Apnea Score 4   Score 4 or greater  Updated health history

## 2012-02-22 NOTE — Patient Instructions (Addendum)
[address] Ken[address] Going.  02/22/2012   Your procedure is scheduled on:  02/29/12 100pm-400pm  Report to [facility] at 1030 AM.  Call this number if you have problems the morning of surgery: [phone]   Remember:   Do not eat food:After Midnight.  May have clear liquidsuntil 0700am then npo   Clear liquids include soda, tea, black coffee, apple or grape juice, broth.  Take these medicines the morning of surgery with A SIP OF WATER:    Do not wear jewelry.  Do not wear lotions, powders, or perfumes..  . Men may shave face and neck.  Do not bring valuables to the hospital.  Contacts, dentures or bridgework may not be worn into surgery.  Leave suitcase in the car. After surgery it may be brought to your room.  For patients admitted to the hospital, checkout time is 11:00 AM the day of discharge.   Marland Kitchen    Special Instructions: CHG Shower Use Special Wash: 1/2 bottle night before surgery and 1/2 bottle morning of surgery. shower chin to toes with CHG. Wash face and private parts with regular soap.     Please read over the following fact sheets that you were given: MRSA Information, coughing and deep breathing exercises, leg exercises

## 2012-02-22 NOTE — Pre-Procedure Instructions (Signed)
Teach Back Method used for teaching on preop appointment.   

## 2012-02-23 MED ORDER — SCOPOLAMINE 1 MG/3DAYS TD PT72
MEDICATED_PATCH | TRANSDERMAL | Status: AC
  Filled 2012-02-23: qty 1

## 2012-02-23 MED ORDER — ONDANSETRON HCL 4 MG/2ML IJ SOLN
INTRAMUSCULAR | Status: AC
  Filled 2012-02-23: qty 2

## 2012-02-25 ENCOUNTER — Telehealth (INDEPENDENT_AMBULATORY_CARE_PROVIDER_SITE_OTHER): Payer: Self-pay

## 2012-02-25 NOTE — Telephone Encounter (Signed)
The pt called to question his bowel prep if he drinks it day before or day of.  I told him day before

## 2012-02-28 NOTE — H&P (Signed)
David Harps.    MRN: [id number]   Description: 77 year old male  Provider: Ernestene Mention, MD  Department: [facility]        Diagnoses     Mass of colon   - Primary    569.89        Vitals      BP Pulse Temp Resp Ht Wt    122/78 64 98.4 F (36.9 C) (Temporal) 14 6' (1.829 m) 214 lb 9.6 oz (97.342 kg)   BMI - 29.11 kg/m2                 History and Physical     Ernestene Mention, MD  Patient ID: David Harps., male   DOB: 12-Sep-1934, 76 y.o.   MRN: [id number]              HPI David Nicklaus. is a 77 y.o. male.  He is referred by Dr. Eula Listen in [place] for management of a malignant mass of the cecum. His primary care physician is Dr. Wyvonnia Lora. His cardiologist is Dr. Rachelle Hora Croitoru.   The patient was recently found to have Hemoccult-positive stools and underwent colonoscopy. Dr. Jena Gauss  performed just yesterday. In the cecum there is a malignant ulcerated mass with rolled edges about 2 cm in diameter. Biopsies are pending but Dr. Jena Gauss states this is obviously a cancer. In the descending and sigmoid colon he removed 3 or 4 benign-appearing polyps. That pathology is pending. 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   The patient is not very symptomatic he doesn't see any blood in his stools. He has chronic diarrhea and irritable bowel syndrome. He doesn't have any abdominal pain.   Labwork reveals CEA at 1.2, liver function tests normal, hemoglobin 10.6.   Significant comorbidities include the prostate cancer, coronary artery disease, status post cardiac cath with 2 drug-eluting stents placed June 2012, on aspirin and Plavix. Hypertension. Cataract surgery. Laparoscopic cholecystectomy 2001.   Family history is negative for GI cancers.   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      Past Medical History   Diagnosis  Date   .  Premature ventricular contraction     .  Prostate cancer     .   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  Radiation  Feb 2011       treatment   .  Biliary dyskinesia     .  GERD (gastroesophageal reflux disease)     .  Hiatal hernia  2010       tiny   .  HTN (hypertension)     .  Rosacea     .  PTSD (post-traumatic stress disorder)     .  Angina pectoris, unstable     .  Diverticula of colon  2010   .  Colon polyps         infammatory   .  CAD (coronary artery disease)     .  Arthritis     .  Exposure to Agent [facility]         Past Surgical History   Procedure  Date   .  Back surgery     .  Cholecystectomy     .  Prostatectomy     .  Tonsillectomy     .  Transurethral resection of prostate     .  Esophagogastroduodenoscopy  10/10/2008       Dr. Maceo Pro hiatal hernia, normal esophagus, normal stomach   .  Colonoscopy  02/12/2004       Dr. Jena Gauss- L side diverticula, inflammatory  colon polyps   .  Esophagogastroduodenoscopy  02/12/2004       Dr. Geni Bers- normal    .  Carotid stent  02/14/11   .  Cardiac catheterization  02/13/2011       [facility] cardiology   .  Coronary stent placement  01/2011       Family History   Problem  Relation  Age of Onset   .  Throat cancer  Father     .  Cancer  Father         Throat   .  Heart attack       .  Prostate cancer  Brother     .  Cancer  Brother         Prostate and Skin   .  Colon cancer  Neg Hx        Social History History   Substance Use Topics   .  Smoking status:  Never Smoker    .  Smokeless tobacco:  Not on file   .  Alcohol Use:  Yes         1 cocktail/week       Allergies   Allergen  Reactions   .  Shellfish Allergy  Anaphylaxis   .  Sulfonamide Derivatives  Diarrhea       Current Outpatient Prescriptions   Medication  Sig  Dispense  Refill   .  aspirin EC 81 MG tablet  Take 81 mg by mouth every morning.         .  B Complex-Folic Acid CAPS  Take 1 capsule by mouth every morning.          .  citalopram (CELEXA) 10 MG tablet  Take 10 mg by mouth every  morning.          .  clopidogrel (PLAVIX) 75 MG tablet  Take 75 mg by mouth every morning.          Marland Kitchen  dexlansoprazole (DEXILANT) 60 MG capsule  Take 60 mg by mouth daily before breakfast.          .  ibuprofen (ADVIL,MOTRIN) 200 MG tablet  Take 200 mg by mouth 2 (two) times daily.         Marland Kitchen  losartan (COZAAR) 50 MG tablet  Take 50 mg by mouth every morning.          .  metoprolol (LOPRESSOR) 50 MG tablet  Take 50 mg by mouth 2 (two) times daily.           .  Multiple Vitamins-Minerals (CENTRUM SILVER) tablet  Take 1 tablet by mouth every morning.          .  nitroGLYCERIN (NITROSTAT) 0.4 MG SL tablet  Place 0.4 mg under the tongue every 5 (five) minutes x 3 doses as needed. For chest pain         .  olopatadine (PATANOL) 0.1 % ophthalmic solution  Place 1 drop into both eyes 2 (two) times daily as needed. For itching         .  peg 3350 powder (MOVIPREP) 100 G SOLR  Take 1 kit (100 g total) by mouth once. As directed  Please purchase 1 Fleets enema to use with the prep  1 kit   0   .  pravastatin (PRAVACHOL) 40 MG tablet  Take 40 mg by mouth daily.         .  rosuvastatin (CRESTOR) 10 MG tablet  Take 10 mg by mouth at bedtime.          Marland Kitchen  latanoprost (XALATAN) 0.005 % ophthalmic solution  Place 1 drop into both eyes at bedtime.              No current facility-administered medications for this visit.       Facility-Administered Medications Ordered in Other Visits   Medication  Dose  Route  Frequency  Provider  Last Rate  Last Dose   .  DISCONTD: butamben-tetracaine-benzocaine (CETACAINE) spray       PRN  Corbin Ade, MD     2 spray at 01/19/12 1304   .  DISCONTD: meperidine (DEMEROL) 100 MG/ML injection                  .  DISCONTD: meperidine (DEMEROL) injection       PRN  Corbin Ade, MD     25 mg at 01/19/12 1307   .  DISCONTD: midazolam (VERSED) 5 MG/5ML injection                  .  DISCONTD: midazolam (VERSED) 5 MG/5ML injection       PRN  Corbin Ade, MD     1 mg at  01/19/12 1332   .  DISCONTD: simethicone susp in sterile water 1000 mL irrigation       PRN  Corbin Ade, MD         .  DISCONTD: sodium chloride 0.9 % injection       PRN  Corbin Ade, MD     1.5 mL at 01/19/12 1354      Review of Systems  Constitutional: Negative for fever, chills and unexpected weight change.  HENT: Negative for hearing loss, congestion, sore throat, trouble swallowing and voice change.   Eyes: Negative for visual disturbance.  Respiratory: Negative for cough and wheezing.   Cardiovascular: Negative for chest pain, palpitations and leg swelling.  Gastrointestinal: Positive for diarrhea. Negative for nausea, vomiting, abdominal pain, constipation, blood in stool, abdominal distention, anal bleeding and rectal pain.        Occas. Reflux symptoms.  Genitourinary: Negative for hematuria and difficulty urinating.  Musculoskeletal: Negative for arthralgias.  Skin: Negative for rash and wound.  Neurological: Negative for seizures, syncope, weakness and headaches.  Hematological: Negative for adenopathy. Does not bruise/bleed easily.  Psychiatric/Behavioral: Negative for confusion.    Blood pressure 122/78, pulse 64, temperature 98.4 F (36.9 C), temperature source Temporal, resp. rate 14, height 6' (1.829 m), weight 214 lb 9.6 oz (97.342 kg).   Physical Exam  Constitutional: He is oriented to person, place, and time. He appears well-developed and well-nourished. No distress.  HENT:   Head: Normocephalic.   Nose: Nose normal.   Mouth/Throat: No oropharyngeal exudate.  Eyes: Conjunctivae and EOM are normal. Pupils are equal, round, and reactive to light. Right eye exhibits no discharge. Left eye exhibits no discharge. No scleral icterus.  Neck: Normal range of motion. Neck supple. No JVD present. No tracheal deviation present. No thyromegaly present.  Cardiovascular: Normal rate, regular rhythm, normal heart sounds and intact distal pulses.    No murmur  heard. Pulmonary/Chest: Effort normal and breath sounds normal. No stridor. No respiratory distress. He  has no wheezes. He has no rales. He exhibits no tenderness.  Abdominal: Soft. Bowel sounds are normal. He exhibits no distension and no mass. There is no tenderness. There is no rebound and no guarding.       Lap. Chole trocar scars.  Musculoskeletal: Normal range of motion. He exhibits no edema and no tenderness.  Lymphadenopathy:    He has no cervical adenopathy.  Neurological: He is alert and oriented to person, place, and time. He has normal reflexes. Coordination normal.  Skin: Skin is warm and dry. No rash noted. He is not diaphoretic. No erythema. No pallor.  Psychiatric: He has a normal mood and affect. His behavior is normal. Judgment and thought content normal.    Data Reviewed Phone conversation with Dr.Robert Rourk. Colonoscopy report reviewed. Pathology report requested. Cardiac clearance requested. CT scan requested.   Assessment Malignant-appearing mass of cecum, 2 cm. Regardless of pathology this will be treated as an adenocarcinoma surgically.   3-4 benign-appearing polyps of descending and sigmoid colon, pathology pending.   Coronary artery disease, status post 2 drug-eluting stents placed June 2012, on aspirin and Plavix, stable. Followed by cardiology.   GERD.   Radiation proctitis   History of prostate cancer treated with external beam radiation therapy and hormone injections   Hypertension   Status post laparoscopic cholecystectomy   Plan Assuming that the polyps of the left colon are benign, we will proceed with planning for a laparoscopic assisted right colectomy, possible open.   CT scan of abdomen and pelvis will be performed preop.   He will be referred back to Dr. Royann Shivers for cardiac clearance, and to ask permission to discontinue aspirin and Plavix 5 days preop.   He will undergo a 24-hour bowel prep preop  I discussed  Risk possibilities of superficial or deep space infection, bleeding, wound healing problems and hernia, injury to adjacent organs, cardiac, pulmonary and thromboembolic problems. He understands these issues. . His questions are answered. He would like to proceed as soon as possible.       Angelia Mould. Derrell Lolling, M.D., [facility] General and Minimally invasive Surgery Breast and Colorectal Surgery Office:   [phone] Pager:   [phone]

## 2012-02-29 ENCOUNTER — Encounter (HOSPITAL_COMMUNITY): Payer: Self-pay | Admitting: *Deleted

## 2012-02-29 ENCOUNTER — Ambulatory Visit (HOSPITAL_COMMUNITY): Payer: Medicare Other | Admitting: *Deleted

## 2012-02-29 ENCOUNTER — Encounter (HOSPITAL_COMMUNITY)
Admission: RE | Disposition: A | Discharge status: Home / Self Care | Payer: Self-pay | Source: Ambulatory Visit | Attending: General Surgery

## 2012-02-29 ENCOUNTER — Inpatient Hospital Stay (HOSPITAL_COMMUNITY)
Admission: RE | Admit: 2012-02-29 | Discharge: 2012-03-06 | DRG: 330 | Disposition: A | Discharge status: Home / Self Care | Payer: Medicare Other | Source: Ambulatory Visit | Attending: General Surgery | Admitting: General Surgery

## 2012-02-29 DIAGNOSIS — K560 Paralytic ileus: Secondary | ICD-10-CM | POA: Diagnosis not present

## 2012-02-29 DIAGNOSIS — I2510 Atherosclerotic heart disease of native coronary artery without angina pectoris: Secondary | ICD-10-CM | POA: Diagnosis present

## 2012-02-29 DIAGNOSIS — R112 Nausea with vomiting, unspecified: Secondary | ICD-10-CM | POA: Diagnosis not present

## 2012-02-29 DIAGNOSIS — K589 Irritable bowel syndrome without diarrhea: Secondary | ICD-10-CM | POA: Diagnosis present

## 2012-02-29 DIAGNOSIS — Z8546 Personal history of malignant neoplasm of prostate: Secondary | ICD-10-CM

## 2012-02-29 DIAGNOSIS — C182 Malignant neoplasm of ascending colon: Secondary | ICD-10-CM

## 2012-02-29 DIAGNOSIS — K219 Gastro-esophageal reflux disease without esophagitis: Secondary | ICD-10-CM | POA: Diagnosis present

## 2012-02-29 DIAGNOSIS — Z01812 Encounter for preprocedural laboratory examination: Secondary | ICD-10-CM

## 2012-02-29 DIAGNOSIS — Y842 Radiological procedure and radiotherapy as the cause of abnormal reaction of the patient, or of later complication, without mention of misadventure at the time of the procedure: Secondary | ICD-10-CM | POA: Diagnosis present

## 2012-02-29 DIAGNOSIS — Z9861 Coronary angioplasty status: Secondary | ICD-10-CM

## 2012-02-29 DIAGNOSIS — K449 Diaphragmatic hernia without obstruction or gangrene: Secondary | ICD-10-CM | POA: Diagnosis present

## 2012-02-29 DIAGNOSIS — C772 Secondary and unspecified malignant neoplasm of intra-abdominal lymph nodes: Secondary | ICD-10-CM | POA: Diagnosis present

## 2012-02-29 DIAGNOSIS — D62 Acute posthemorrhagic anemia: Secondary | ICD-10-CM | POA: Diagnosis not present

## 2012-02-29 DIAGNOSIS — C180 Malignant neoplasm of cecum: Principal | ICD-10-CM | POA: Diagnosis present

## 2012-02-29 DIAGNOSIS — I10 Essential (primary) hypertension: Secondary | ICD-10-CM | POA: Diagnosis present

## 2012-02-29 DIAGNOSIS — D500 Iron deficiency anemia secondary to blood loss (chronic): Secondary | ICD-10-CM | POA: Diagnosis present

## 2012-02-29 DIAGNOSIS — K6289 Other specified diseases of anus and rectum: Secondary | ICD-10-CM | POA: Diagnosis present

## 2012-02-29 DIAGNOSIS — C19 Malignant neoplasm of rectosigmoid junction: Secondary | ICD-10-CM

## 2012-02-29 HISTORY — PX: PARTIAL COLECTOMY: SHX5273

## 2012-02-29 LAB — CBC
HCT: 29.2 % — ABNORMAL LOW (ref 39.0–52.0)
Hemoglobin: 9.2 g/dL — ABNORMAL LOW (ref 13.0–17.0)
MCH: 24.5 pg — ABNORMAL LOW (ref 26.0–34.0)
MCHC: 31.5 g/dL (ref 30.0–36.0)
MCV: 77.7 fL — ABNORMAL LOW (ref 78.0–100.0)
Platelets: 320 K/uL (ref 150–400)
RBC: 3.76 MIL/uL — ABNORMAL LOW (ref 4.22–5.81)
RDW: 14.7 % (ref 11.5–15.5)
WBC: 17.3 K/uL — ABNORMAL HIGH (ref 4.0–10.5)

## 2012-02-29 LAB — CREATININE, SERUM
Creatinine, Ser: 1.01 mg/dL (ref 0.50–1.35)
GFR calc Af Amer: 82 mL/min — ABNORMAL LOW
GFR calc non Af Amer: 71 mL/min — ABNORMAL LOW

## 2012-02-29 LAB — TYPE AND SCREEN: ABO/RH(D): O POS

## 2012-02-29 SURGERY — COLECTOMY, RIGHT, LAPAROSCOPIC
Anesthesia: General | Site: Abdomen | Laterality: Right | Wound class: Clean Contaminated

## 2012-02-29 MED ORDER — ONDANSETRON HCL 4 MG PO TABS
4.0000 mg | ORAL_TABLET | Freq: Four times a day (QID) | ORAL | Status: DC | PRN
  Administered 2012-03-03 – 2012-03-05 (×4): 4 mg via ORAL
  Filled 2012-02-29 (×4): qty 1

## 2012-02-29 MED ORDER — HEPARIN SODIUM (PORCINE) 5000 UNIT/ML IJ SOLN
INTRAMUSCULAR | Status: AC
  Filled 2012-02-29: qty 1

## 2012-02-29 MED ORDER — SUCCINYLCHOLINE CHLORIDE 20 MG/ML IJ SOLN
INTRAMUSCULAR | Status: DC | PRN
  Administered 2012-02-29: 100 mg via INTRAVENOUS

## 2012-02-29 MED ORDER — CEFOXITIN SODIUM-DEXTROSE 1-4 GM-% IV SOLR (PREMIX)
INTRAVENOUS | Status: AC
  Filled 2012-02-29: qty 100

## 2012-02-29 MED ORDER — DEXTROSE 5 % IV SOLN
2.0000 g | INTRAVENOUS | Status: AC
  Administered 2012-02-29: 2 g via INTRAVENOUS
  Filled 2012-02-29: qty 2

## 2012-02-29 MED ORDER — METOPROLOL TARTRATE 50 MG PO TABS
50.0000 mg | ORAL_TABLET | Freq: Two times a day (BID) | ORAL | Status: DC
  Administered 2012-02-29 – 2012-03-05 (×11): 50 mg via ORAL
  Filled 2012-02-29 (×13): qty 1

## 2012-02-29 MED ORDER — CHLORHEXIDINE GLUCONATE 4 % EX LIQD
1.0000 "application " | Freq: Once | CUTANEOUS | Status: DC
  Filled 2012-02-29: qty 15

## 2012-02-29 MED ORDER — PANTOPRAZOLE SODIUM 40 MG PO TBEC
40.0000 mg | DELAYED_RELEASE_TABLET | Freq: Every day | ORAL | Status: DC
  Administered 2012-02-29 – 2012-03-05 (×6): 40 mg via ORAL
  Filled 2012-02-29 (×8): qty 1

## 2012-02-29 MED ORDER — CHLORHEXIDINE GLUCONATE 0.12 % MT SOLN
15.0000 mL | Freq: Two times a day (BID) | OROMUCOSAL | Status: DC
  Administered 2012-02-29 – 2012-03-01 (×2): 15 mL via OROMUCOSAL
  Filled 2012-02-29 (×5): qty 15

## 2012-02-29 MED ORDER — GLYCOPYRROLATE 0.2 MG/ML IJ SOLN
INTRAMUSCULAR | Status: DC | PRN
  Administered 2012-02-29: .8 mg via INTRAVENOUS

## 2012-02-29 MED ORDER — LACTATED RINGERS IR SOLN
Status: DC | PRN
  Administered 2012-02-29: 1000 mL

## 2012-02-29 MED ORDER — BUPIVACAINE-EPINEPHRINE 0.5% -1:200000 IJ SOLN
INTRAMUSCULAR | Status: AC
  Filled 2012-02-29: qty 1

## 2012-02-29 MED ORDER — EPHEDRINE SULFATE 50 MG/ML IJ SOLN
INTRAMUSCULAR | Status: DC | PRN
  Administered 2012-02-29 (×4): 5 mg via INTRAVENOUS

## 2012-02-29 MED ORDER — POTASSIUM CHLORIDE IN NACL 20-0.9 MEQ/L-% IV SOLN
INTRAVENOUS | Status: DC
  Administered 2012-02-29 – 2012-03-02 (×4): via INTRAVENOUS
  Administered 2012-03-02: 100 mL via INTRAVENOUS
  Administered 2012-03-02 – 2012-03-04 (×4): via INTRAVENOUS
  Administered 2012-03-04: 100 mL via INTRAVENOUS
  Administered 2012-03-04 – 2012-03-05 (×2): via INTRAVENOUS
  Filled 2012-02-29 (×15): qty 1000

## 2012-02-29 MED ORDER — ACETAMINOPHEN 10 MG/ML IV SOLN
INTRAVENOUS | Status: AC
  Filled 2012-02-29: qty 100

## 2012-02-29 MED ORDER — DEXAMETHASONE SODIUM PHOSPHATE 10 MG/ML IJ SOLN
INTRAMUSCULAR | Status: DC | PRN
  Administered 2012-02-29: 10 mg via INTRAVENOUS

## 2012-02-29 MED ORDER — MORPHINE SULFATE 2 MG/ML IJ SOLN
2.0000 mg | INTRAMUSCULAR | Status: DC | PRN
  Administered 2012-02-29 – 2012-03-01 (×6): 2 mg via INTRAVENOUS
  Filled 2012-02-29 (×6): qty 1

## 2012-02-29 MED ORDER — LOSARTAN POTASSIUM 50 MG PO TABS
50.0000 mg | ORAL_TABLET | Freq: Every morning | ORAL | Status: DC
  Administered 2012-03-01 – 2012-03-05 (×5): 50 mg via ORAL
  Filled 2012-02-29 (×6): qty 1

## 2012-02-29 MED ORDER — DEXTROSE 5 % IV SOLN
2.0000 g | Freq: Three times a day (TID) | INTRAVENOUS | Status: AC
  Administered 2012-02-29 – 2012-03-01 (×3): 2 g via INTRAVENOUS
  Filled 2012-02-29 (×3): qty 2

## 2012-02-29 MED ORDER — NEOSTIGMINE METHYLSULFATE 1 MG/ML IJ SOLN
INTRAMUSCULAR | Status: DC | PRN
  Administered 2012-02-29: 5 mg via INTRAVENOUS

## 2012-02-29 MED ORDER — HEPARIN SODIUM (PORCINE) 5000 UNIT/ML IJ SOLN
5000.0000 [IU] | Freq: Once | INTRAMUSCULAR | Status: AC
  Administered 2012-02-29: 5000 [IU] via SUBCUTANEOUS

## 2012-02-29 MED ORDER — HETASTARCH-ELECTROLYTES 6 % IV SOLN
INTRAVENOUS | Status: DC | PRN
  Administered 2012-02-29: 15:00:00 via INTRAVENOUS

## 2012-02-29 MED ORDER — ROCURONIUM BROMIDE 100 MG/10ML IV SOLN
INTRAVENOUS | Status: DC | PRN
  Administered 2012-02-29 (×3): 10 mg via INTRAVENOUS
  Administered 2012-02-29: 30 mg via INTRAVENOUS
  Administered 2012-02-29 (×2): 10 mg via INTRAVENOUS

## 2012-02-29 MED ORDER — HYDROMORPHONE HCL PF 1 MG/ML IJ SOLN
INTRAMUSCULAR | Status: DC | PRN
  Administered 2012-02-29 (×2): 1 mg via INTRAVENOUS

## 2012-02-29 MED ORDER — PROMETHAZINE HCL 25 MG/ML IJ SOLN: 6.2500 mg | INTRAMUSCULAR | Status: DC | PRN

## 2012-02-29 MED ORDER — 0.9 % SODIUM CHLORIDE (POUR BTL) OPTIME
TOPICAL | Status: DC | PRN
  Administered 2012-02-29: 2000 mL

## 2012-02-29 MED ORDER — ONDANSETRON HCL 4 MG/2ML IJ SOLN
4.0000 mg | Freq: Four times a day (QID) | INTRAMUSCULAR | Status: DC | PRN
  Administered 2012-03-02 – 2012-03-03 (×3): 4 mg via INTRAVENOUS
  Filled 2012-02-29 (×3): qty 2

## 2012-02-29 MED ORDER — HYDROMORPHONE HCL PF 1 MG/ML IJ SOLN: 0.2500 mg | INTRAMUSCULAR | Status: DC | PRN

## 2012-02-29 MED ORDER — LATANOPROST 0.005 % OP SOLN
1.0000 [drp] | Freq: Every day | OPHTHALMIC | Status: DC
  Administered 2012-02-29 – 2012-03-05 (×6): 1 [drp] via OPHTHALMIC
  Filled 2012-02-29 (×2): qty 2.5

## 2012-02-29 MED ORDER — MIDAZOLAM HCL 5 MG/5ML IJ SOLN
INTRAMUSCULAR | Status: DC | PRN
  Administered 2012-02-29: 2 mg via INTRAVENOUS

## 2012-02-29 MED ORDER — BIOTENE DRY MOUTH MT LIQD
15.0000 mL | Freq: Two times a day (BID) | OROMUCOSAL | Status: DC
  Administered 2012-02-29 – 2012-03-05 (×8): 15 mL via OROMUCOSAL

## 2012-02-29 MED ORDER — NITROGLYCERIN 0.4 MG SL SUBL: 0.4000 mg | SUBLINGUAL_TABLET | SUBLINGUAL | Status: DC | PRN

## 2012-02-29 MED ORDER — CITALOPRAM HYDROBROMIDE 10 MG PO TABS
10.0000 mg | ORAL_TABLET | Freq: Every morning | ORAL | Status: DC
  Administered 2012-03-01 – 2012-03-05 (×5): 10 mg via ORAL
  Filled 2012-02-29 (×6): qty 1

## 2012-02-29 MED ORDER — ACETAMINOPHEN 10 MG/ML IV SOLN
INTRAVENOUS | Status: DC | PRN
  Administered 2012-02-29: 1000 mg via INTRAVENOUS

## 2012-02-29 MED ORDER — FENTANYL CITRATE 0.05 MG/ML IJ SOLN
INTRAMUSCULAR | Status: DC | PRN
  Administered 2012-02-29: 50 ug via INTRAVENOUS
  Administered 2012-02-29: 100 ug via INTRAVENOUS
  Administered 2012-02-29 (×5): 50 ug via INTRAVENOUS

## 2012-02-29 MED ORDER — LACTATED RINGERS IV SOLN
INTRAVENOUS | Status: DC
Start: 2012-02-29 — End: 2012-02-29
  Administered 2012-02-29: 1000 mL via INTRAVENOUS
  Administered 2012-02-29 (×2): via INTRAVENOUS

## 2012-02-29 MED ORDER — LIDOCAINE HCL (CARDIAC) 20 MG/ML IV SOLN
INTRAVENOUS | Status: DC | PRN
  Administered 2012-02-29: 100 mg via INTRAVENOUS

## 2012-02-29 MED ORDER — ONDANSETRON HCL 4 MG/2ML IJ SOLN
INTRAMUSCULAR | Status: DC | PRN
  Administered 2012-02-29: 4 mg via INTRAVENOUS

## 2012-02-29 MED ORDER — BUPIVACAINE-EPINEPHRINE PF 0.5-1:200000 % IJ SOLN
INTRAMUSCULAR | Status: DC | PRN
  Administered 2012-02-29: 20 mL

## 2012-02-29 MED ORDER — HEPARIN SODIUM (PORCINE) 5000 UNIT/ML IJ SOLN
5000.0000 [IU] | Freq: Three times a day (TID) | INTRAMUSCULAR | Status: DC
  Administered 2012-03-01 – 2012-03-03 (×6): 5000 [IU] via SUBCUTANEOUS
  Filled 2012-02-29 (×9): qty 1

## 2012-02-29 MED ORDER — PROPOFOL 10 MG/ML IV EMUL
INTRAVENOUS | Status: DC | PRN
  Administered 2012-02-29: 150 mg via INTRAVENOUS

## 2012-02-29 SURGICAL SUPPLY — 70 items
APPLICATOR COTTON TIP 6IN STRL (MISCELLANEOUS) ×4 IMPLANT
APPLIER CLIP 5 13 M/L LIGAMAX5 (MISCELLANEOUS)
BLADE EXTENDED COATED 6.5IN (ELECTRODE) ×2 IMPLANT
BLADE HEX COATED 2.75 (ELECTRODE) ×2 IMPLANT
BLADE SURG SZ10 CARB STEEL (BLADE) ×2 IMPLANT
CABLE HIGH FREQUENCY MONO STRZ (ELECTRODE) ×2 IMPLANT
CANISTER SUCTION 2500CC (MISCELLANEOUS) ×4 IMPLANT
CELLS DAT CNTRL 66122 CELL SVR (MISCELLANEOUS) ×1 IMPLANT
CLIP APPLIE 5 13 M/L LIGAMAX5 (MISCELLANEOUS) IMPLANT
CLIP TI LARGE 6 (CLIP) IMPLANT
CLOTH BEACON ORANGE TIMEOUT ST (SAFETY) ×2 IMPLANT
COVER MAYO STAND STRL (DRAPES) ×2 IMPLANT
DECANTER SPIKE VIAL GLASS SM (MISCELLANEOUS) ×2 IMPLANT
DRAPE LAPAROSCOPIC ABDOMINAL (DRAPES) ×2 IMPLANT
DRAPE LG THREE QUARTER DISP (DRAPES) IMPLANT
DRAPE UTILITY XL STRL (DRAPES) ×2 IMPLANT
DRAPE WARM FLUID 44X44 (DRAPE) ×2 IMPLANT
DRESSING TELFA 8X3 (GAUZE/BANDAGES/DRESSINGS) ×2 IMPLANT
DRSG PAD ABDOMINAL 8X10 ST (GAUZE/BANDAGES/DRESSINGS) IMPLANT
ELECT REM PT RETURN 9FT ADLT (ELECTROSURGICAL) ×2
ELECTRODE REM PT RTRN 9FT ADLT (ELECTROSURGICAL) ×1 IMPLANT
ENSEAL DEVICE STD TIP 35CM (ENDOMECHANICALS) IMPLANT
GLOVE BIOGEL PI IND STRL 7.0 (GLOVE) ×2 IMPLANT
GLOVE BIOGEL PI INDICATOR 7.0 (GLOVE) ×2
GLOVE ECLIPSE 8.0 STRL XLNG CF (GLOVE) ×4 IMPLANT
GLOVE EUDERMIC 7 POWDERFREE (GLOVE) ×6 IMPLANT
GLOVE INDICATOR 8.0 STRL GRN (GLOVE) ×2 IMPLANT
GLOVE SURG SS PI 6.5 STRL IVOR (GLOVE) ×8 IMPLANT
GLOVE SURG SS PI 7.0 STRL IVOR (GLOVE) ×2 IMPLANT
GOWN STRL NON-REIN LRG LVL3 (GOWN DISPOSABLE) ×6 IMPLANT
GOWN STRL REIN XL XLG (GOWN DISPOSABLE) ×6 IMPLANT
HAND ACTIVATED (MISCELLANEOUS) ×2 IMPLANT
KIT BASIN OR (CUSTOM PROCEDURE TRAY) IMPLANT
LEGGING LITHOTOMY PAIR STRL (DRAPES) IMPLANT
LIGASURE IMPACT 36 18CM CVD LR (INSTRUMENTS) IMPLANT
NS IRRIG 1000ML POUR BTL (IV SOLUTION) ×4 IMPLANT
PACK GENERAL/GYN (CUSTOM PROCEDURE TRAY) IMPLANT
RELOAD PROXIMATE 75MM BLUE (ENDOMECHANICALS) ×2 IMPLANT
RTRCTR WOUND ALEXIS 18CM MED (MISCELLANEOUS) ×2
SCISSORS LAP 5X35 DISP (ENDOMECHANICALS) ×2 IMPLANT
SET IRRIG TUBING LAPAROSCOPIC (IRRIGATION / IRRIGATOR) ×2 IMPLANT
SOLUTION ANTI FOG 6CC (MISCELLANEOUS) ×2 IMPLANT
SPONGE GAUZE 4X4 12PLY (GAUZE/BANDAGES/DRESSINGS) ×2 IMPLANT
SPONGE LAP 18X18 X RAY DECT (DISPOSABLE) ×6 IMPLANT
STAPLER PROXIMATE 75MM BLUE (STAPLE) ×2 IMPLANT
STAPLER VISISTAT 35W (STAPLE) ×2 IMPLANT
SUCTION POOLE TIP (SUCTIONS) ×2 IMPLANT
SUT NOV 1 T60/GS (SUTURE) IMPLANT
SUT NOVA NAB DX-16 0-1 5-0 T12 (SUTURE) IMPLANT
SUT NOVA T20/GS 25 (SUTURE) IMPLANT
SUT PDS AB 1 TP1 96 (SUTURE) ×4 IMPLANT
SUT SILK 2 0 (SUTURE) ×2
SUT SILK 2 0 SH CR/8 (SUTURE) ×4 IMPLANT
SUT SILK 2 0SH CR/8 30 (SUTURE) ×2 IMPLANT
SUT SILK 2-0 18XBRD TIE 12 (SUTURE) ×2 IMPLANT
SUT SILK 2-0 30XBRD TIE 12 (SUTURE) IMPLANT
SUT SILK 3 0 (SUTURE) ×1
SUT SILK 3 0 SH CR/8 (SUTURE) ×2 IMPLANT
SUT SILK 3-0 18XBRD TIE 12 (SUTURE) ×1 IMPLANT
SYR BULB IRRIGATION 50ML (SYRINGE) ×2 IMPLANT
TAPE CLOTH SURG 4X10 WHT LF (GAUZE/BANDAGES/DRESSINGS) ×2 IMPLANT
TOWEL OR 17X26 10 PK STRL BLUE (TOWEL DISPOSABLE) ×4 IMPLANT
TOWEL OR NON WOVEN STRL DISP B (DISPOSABLE) ×2 IMPLANT
TRAY FOLEY CATH 14FRSI W/METER (CATHETERS) ×4 IMPLANT
TROCAR BLADELESS OPT 5 75 (ENDOMECHANICALS) ×4 IMPLANT
TROCAR XCEL 12X100 BLDLESS (ENDOMECHANICALS) ×2 IMPLANT
TROCAR XCEL NON-BLD 5MMX100MML (ENDOMECHANICALS) ×10 IMPLANT
TUBING INSUFFLATION 10FT LAP (TUBING) ×2 IMPLANT
WATER STERILE IRR 1500ML POUR (IV SOLUTION) IMPLANT
YANKAUER SUCT BULB TIP NO VENT (SUCTIONS) ×2 IMPLANT

## 2012-02-29 NOTE — Anesthesia Preprocedure Evaluation (Addendum)
Anesthesia Evaluation  Patient identified by MRN, date of birth, ID band Patient awake    Reviewed: Allergy & Precautions, H&P , NPO status , Patient's Chart, lab work & pertinent test results  Airway Mallampati: II TM Distance: >3 FB Neck ROM: Full    Dental No notable dental hx.    Pulmonary neg pulmonary ROS,  breath sounds clear to auscultation  Pulmonary exam normal       Cardiovascular hypertension, Pt. on home beta blockers and Pt. on medications + angina + CAD and + Cardiac Stents + dysrhythmias Rhythm:Regular Rate:Normal     Neuro/Psych PSYCHIATRIC DISORDERS Agent Orange Exposure. PTSD  Neuromuscular disease    GI/Hepatic Neg liver ROS, hiatal hernia, GERD-  Medicated,  Endo/Other  negative endocrine ROS  Renal/GU negative Renal ROS  negative genitourinary   Musculoskeletal negative musculoskeletal ROS (+)   Abdominal   Peds negative pediatric ROS (+)  Hematology negative hematology ROS (+)   Anesthesia Other Findings   Reproductive/Obstetrics negative OB ROS                          Anesthesia Physical Anesthesia Plan  ASA: III  Anesthesia Plan: General   Post-op Pain Management:    Induction: Intravenous  Airway Management Planned: Oral ETT  Additional Equipment:   Intra-op Plan:   Post-operative Plan: Extubation in OR  Informed Consent: I have reviewed the patients History and Physical, chart, labs and discussed the procedure including the risks, benefits and alternatives for the proposed anesthesia with the patient or authorized representative who has indicated his/her understanding and acceptance.   Dental advisory given  Plan Discussed with: CRNA  Anesthesia Plan Comments:         Anesthesia Quick Evaluation

## 2012-02-29 NOTE — Interval H&P Note (Signed)
History and Physical Interval Note:  02/29/2012 12:57 PM  David Pugh.  has presented today for surgery, with the diagnosis of CECAL MASS  The goals of treatment and the various methods of treatment have been discussed with the patient and family. After consideration of risks, benefits and other options for treatment, the patient has consented to  Procedure(s) (LRB): LAPAROSCOPIC RIGHT COLECTOMY (Right) PARTIAL COLECTOMY (Right) as a surgical intervention .  The patient's history has been reviewed and the patient examined today, no change in status, stable for surgery.  I have reviewed the patients' chart and labs.  Questions were answered to the patient's satisfaction.     Ernestene Mention

## 2012-02-29 NOTE — Transfer of Care (Signed)
Immediate Anesthesia Transfer of Care Note  Patient: David Pugh.  Procedure(s) Performed: Procedure(s) (LRB): LAPAROSCOPIC RIGHT COLECTOMY (Right) PARTIAL COLECTOMY (Right)  Patient Location: PACU  Anesthesia Type: General  Level of Consciousness: awake, alert  and oriented  Airway & Oxygen Therapy: Patient Spontanous Breathing and Patient connected to face mask oxygen  Post-op Assessment: Report given to PACU RN and Post -op Vital signs reviewed and stable  Post vital signs: Reviewed and stable  Complications: No apparent anesthesia complications

## 2012-02-29 NOTE — Op Note (Signed)
Patient Name:           David Pugh.   Date of Surgery:        02/29/2012  Pre op Diagnosis:      Adenocarcinoma of the right colon  Post op Diagnosis:    same  Procedure:                 Laparoscopic-assisted right colectomy and  Surgeon:                     Angelia Mould. Derrell Lolling, M.D., FACS  Assistant:                      Harden Mo, M.D., FACS  Operative Indications:   [name]. is a 77 y.o. male. He was referred by Dr. Roetta Sessions in [place] for management of a malignant mass of the cecum. His primary care physician is Dr. Wyvonnia Lora. His cardiologist is Dr. Rachelle Hora Croitoru.  The patient was recently found to have Hemoccult-positive stools and underwent colonoscopy. In the cecum there is a malignant ulcerated mass with rolled edges about 2 cm in diameter. Biopsies show adenocarcinoma.   In the descending and sigmoid colon he removed 3 or 4 benign-appearing polyps. That pathology shows adenomas but there is one fragment showing high grade dysplasia.  Dr. Jena Gauss and I have discussed this and feel that this was completely resected and followup colonoscopy in 6-12 months is appropriate. . Rectal radiation proctitis was again noted.  The patient is not very symptomatic he doesn't see any blood in his stools. He has chronic diarrhea and irritable bowel syndrome. He doesn't have any abdominal pain.  CT shows no evidence for metastatic disease. Labwork reveals CEA at 1.2, liver function tests normal, hemoglobin 10.6.  Significant comorbidities include the prostate cancer, coronary artery disease, status post cardiac cath with 2 drug-eluting stents placed June 2012, on aspirin and Plavix. Hypertension. Cataract surgery. Laparoscopic cholecystectomy 2001.  Family history is negative for GI cancers.   Operative Findings:       There was a 2 cm ulcerated mass in the cecum adjacent to the ileocecal valve. This was consistent with the colonoscopic findings. There were no other  palpable masses in the cecum, right colon or hepatic flexure. There was no palpable adenopathy. The liver looked normal. There were extensive adhesions of the transverse colon to the undersurface of the liver, presumed secondary to his previous cholecystectomy.  Procedure in Detail:          Following the induction of general endotracheal anesthesia the Foley catheter was placed. An oral gastric tube was placed. The abdomen was prepped and draped in a sterile fashion. Intravenous antibiotics were given. Surgical time out was performed.  An 11 mm Hassan trocar was placed just above the umbilicus in the midline with an open technique. Trocar entry was uneventful. The trocar were secured with a pursestring suture of 0 Vicryl. Pneumoperitoneum was created.  Ultimately I placed four  more 5 mm trocars, suprapubic, right flank, left flank, and midepigastric. After exploring the abdomen we identified the terminal ileum and ileocecal valve, the cecum and the appendix. We divided the lateral peritoneal attachments which does mobilize the terminal ileum and right colon from lateral to medial. The hepatic flexure and transverse colon were densely adhered to the undersurface of   the liver, and  we spent  a long time dissecting this down. Eventually we were able to  mobilize the hepatic flexure and transverse colon down and medially and we identified the duodenum which was not injured. Eventually we felt we had good mobilization and could move the cecum up almost to the splenic flexure. We then released the pneumoperitoneum and made an incision from the supra-umbilical port site about 8 cm superiorly in the midline. The fascia was incised and the abdominal cavity entered. A wound protector was placed. We delivered the terminal ileum, right colon and hepatic flexure and transverse colon into the wound.  We transected the terminal ileum with the GIA stapling device. Ileocolic vessels were large and were clamped, divided, and  doubly suture-ligated with 2-0 silk suture ligatures. Further mesenteric vessels were taken down between clamps and ligated with 2-0 silk ties. We transected the transverse colon just to the right of the middle colic vessels. We took the specimen to a back table and opened it up and identified the cancer in the cecum and felt that this was concordant with the colonoscopic findings. I changed my gown and gloves.  Anastomosis was created between the terminal ileum and mid transverse colon with a GIA stapling device and the common defect was closed with a TA 60 stapling device. We changed our gloves and instruments. We placed a few sutures of 3-0 silk to reinforce the staple line at critical points. The terminal ileum and  Colon were pink with good vascularity the anastomosis was greater than 2 fingerbreadths. The mesentery was closed with interrupted sutures of 2-0 silk. We were satisfied with the anastomosis. We returned the colon and small bowel to the abdominal cavity. We irrigated the subhepatic space, abdominal cavity and pelvis with 2 L of saline. There did not appear to be any bleeding. The midline fascia was closed with a running suture of #1, double-stranded PDS and skin staples. Pneumoperitoneum was recreated. We inserted the camera and suctioned out the irrigation fluid that was left. We checked for bleeding and did not see any. We removed the trocars and released the pneumoperitoneum. The rest of the 5 mm trocar sites were closed with skin staples. Bandages were placed and the patient taken to recovery in stable condition.  Complications none. EBL 100 cc. Counts correct.     Angelia Mould. Derrell Lolling, M.D., FACS General and Minimally Invasive Surgery Breast and Colorectal Surgery  02/29/2012 4:29 PM

## 2012-02-29 NOTE — Preoperative (Signed)
Beta Blockers   Reason not to administer Beta Blockers:pt took lopressor this am 

## 2012-02-29 NOTE — Anesthesia Postprocedure Evaluation (Signed)
  Anesthesia Post-op Note  Patient: David Pugh.  Procedure(s) Performed: Procedure(s) (LRB): LAPAROSCOPIC RIGHT COLECTOMY (Right) PARTIAL COLECTOMY (Right)  Patient Location: PACU  Anesthesia Type: General  Level of Consciousness: awake and alert   Airway and Oxygen Therapy: Patient Spontanous Breathing  Post-op Pain: mild  Post-op Assessment: Post-op Vital signs reviewed, Patient's Cardiovascular Status Stable, Respiratory Function Stable, Patent Airway and No signs of Nausea or vomiting  Post-op Vital Signs: stable  Complications: No apparent anesthesia complications

## 2012-03-01 ENCOUNTER — Encounter (HOSPITAL_COMMUNITY): Payer: Self-pay | Admitting: General Surgery

## 2012-03-01 LAB — CBC
Hemoglobin: 8.9 g/dL — ABNORMAL LOW (ref 13.0–17.0)
Platelets: 365 10*3/uL (ref 150–400)
RBC: 3.7 MIL/uL — ABNORMAL LOW (ref 4.22–5.81)

## 2012-03-01 LAB — BASIC METABOLIC PANEL
GFR calc Af Amer: 75 mL/min — ABNORMAL LOW (ref >90)
GFR calc non Af Amer: 64 mL/min — ABNORMAL LOW (ref >90)
Potassium: 4.9 mEq/L (ref 3.5–5.1)
Sodium: 132 mEq/L — ABNORMAL LOW (ref 135–145)

## 2012-03-01 MED ORDER — CHLORHEXIDINE GLUCONATE 0.12 % MT SOLN
15.0000 mL | Freq: Two times a day (BID) | OROMUCOSAL | Status: DC
  Administered 2012-03-01 – 2012-03-05 (×9): 15 mL via OROMUCOSAL
  Filled 2012-03-01 (×11): qty 15

## 2012-03-01 NOTE — Care Management Note (Unsigned)
    Page 1 of 1   03/01/2012     2:28:19 PM   CARE MANAGEMENT NOTE 03/01/2012  Patient:  David Pugh, David Pugh   Account Number:  [id number]  Date Initiated:  03/01/2012  Documentation initiated by:  David Pugh  Subjective/Objective Assessment:   ADMITTED W/COLON MASS     Action/Plan:   FROM HOME   Anticipated DC Date:  03/06/2012   Anticipated DC Plan:  HOME/SELF CARE      DC Planning Services  CM consult      Choice offered to / List presented to:             Status of service:  In process, will continue to follow Medicare Important Message given?   (If response is "NO", the following Medicare IM given date fields will be blank) Date Medicare IM given:   Date Additional Medicare IM given:    Discharge Disposition:    Per UR Regulation:  Reviewed for med. necessity/level of care/duration of stay  If discussed at Long Length of Stay Meetings, dates discussed:    Comments:  03/01/12 David Lichtman RN,BSN NCM [phone] PD#1 LAP R COLECTOMY

## 2012-03-01 NOTE — Progress Notes (Signed)
1 Day Post-Op  Subjective: Stable and alert. Denies chest pain, shortness of breath, or nausea. Does note some abdominal discomfort. Did not sleep much last light.  Hemoglobin 8.9. Preop hemoglobin 9.7.  I discussed the operative findings with the patient and his wife.  Objective: Vital signs in last 24 hours: Temp:  [97.3 F (36.3 C)-98.6 F (37 C)] 98.4 F (36.9 C) (07/02 2205) Pulse Rate:  [61-92] 85  (07/02 2205) Resp:  [9-20] 14  (07/02 2205) BP: (144-164)/(75-85) 153/75 mmHg (07/02 2205) SpO2:  [93 %-100 %] 94 % (07/02 2205) Weight:  [221 lb 9.6 oz (100.517 kg)] 221 lb 9.6 oz (100.517 kg) (07/02 1735) Last BM Date: 02/29/12  Intake/Output from previous day: 07/02 0701 - 07/03 0700 In: 4085 [I.V.:3585; IV Piggyback:500] Out: 1275 [Urine:1175; Blood:100] Intake/Output this shift: Total I/O In: 585 [I.V.:585] Out: 475 [Urine:475]  General appearance: alert. In no distress. Mental status normal. Looks good. Resp: clear to auscultation bilaterally. Decreased breath sounds at bases. No wheeze or rhonchi. GI: abdomen soft. Dressings clean and dry. Not distended. Occasional bowel sounds.  Lab Results:  Results for orders placed during the hospital encounter of 02/29/12 (from the past 24 hour(s))  TYPE AND SCREEN     Status: Normal   Collection Time   02/29/12 11:30 AM      Component Value Range   ABO/RH(D) O POS     Antibody Screen NEG     Sample Expiration 03/03/2012    ABO/RH     Status: Normal   Collection Time   02/29/12 11:30 AM      Component Value Range   ABO/RH(D) O POS    CBC     Status: Abnormal   Collection Time   02/29/12  6:29 PM      Component Value Range   WBC 17.3 (*) 4.0 - 10.5 K/uL   RBC 3.76 (*) 4.22 - 5.81 MIL/uL   Hemoglobin 9.2 (*) 13.0 - 17.0 g/dL   HCT 40.9 (*) 81.1 - 91.4 %   MCV 77.7 (*) 78.0 - 100.0 fL   MCH 24.5 (*) 26.0 - 34.0 pg   MCHC 31.5  30.0 - 36.0 g/dL   RDW 78.2  95.6 - 21.3 %   Platelets 320  150 - 400 K/uL  CREATININE, SERUM      Status: Abnormal   Collection Time   02/29/12  6:29 PM      Component Value Range   Creatinine, Ser 1.01  0.50 - 1.35 mg/dL   GFR calc non Af Amer 71 (*) >90 mL/min   GFR calc Af Amer 82 (*) >90 mL/min  BASIC METABOLIC PANEL     Status: Abnormal   Collection Time   03/01/12  4:31 AM      Component Value Range   Sodium 132 (*) 135 - 145 mEq/L   Potassium 4.9  3.5 - 5.1 mEq/L   Chloride 105  96 - 112 mEq/L   CO2 22  19 - 32 mEq/L   Glucose, Bld 125 (*) 70 - 99 mg/dL   BUN 13  6 - 23 mg/dL   Creatinine, Ser 0.86  0.50 - 1.35 mg/dL   Calcium 8.3 (*) 8.4 - 10.5 mg/dL   GFR calc non Af Amer 64 (*) >90 mL/min   GFR calc Af Amer 75 (*) >90 mL/min  CBC     Status: Abnormal   Collection Time   03/01/12  4:31 AM      Component Value Range  WBC 16.6 (*) 4.0 - 10.5 K/uL   RBC 3.70 (*) 4.22 - 5.81 MIL/uL   Hemoglobin 8.9 (*) 13.0 - 17.0 g/dL   HCT 86.5 (*) 78.4 - 69.6 %   MCV 77.6 (*) 78.0 - 100.0 fL   MCH 24.1 (*) 26.0 - 34.0 pg   MCHC 31.0  30.0 - 36.0 g/dL   RDW 29.5  28.4 - 13.2 %   Platelets 365  150 - 400 K/uL     Studies/Results: @RISRSLT24 @     . antiseptic oral rinse  15 mL Mouth Rinse q12n4p  . cefOXitin  2 g Intravenous 60 min Pre-Op  . cefOXitin  2 g Intravenous Q8H  . chlorhexidine  15 mL Mouth Rinse BID  . citalopram  10 mg Oral q morning - 10a  . heparin      . heparin  5,000 Units Subcutaneous Once  . heparin  5,000 Units Subcutaneous Q8H  . latanoprost  1 drop Both Eyes QHS  . losartan  50 mg Oral q morning - 10a  . metoprolol  50 mg Oral BID  . pantoprazole  40 mg Oral Q1200  . DISCONTD: chlorhexidine  1 application Topical Once     Assessment/Plan: s/p Procedure(s): LAPAROSCOPIC RIGHT COLECTOMY PARTIAL COLECTOMY  POD #1. Stable. discontinue Foley Allow clear liquids as tolerated Out of bed. Await pathology  Coronary artery disease. Stable. Continue beta blocker.  Hypertension. Stable. Continue Cozaar  Anemia. Combination of acute blood loss  superimposed on chronic. No indication for transfusion. Consider recheck lab work Friday, July 5.    LOS: 1 day    Jedi Catalfamo M. Derrell Lolling, M.D., [facility] General and Minimally invasive Surgery Breast and Colorectal Surgery Office:   [phone] Pager:   [phone]  03/01/2012  . .prob

## 2012-03-02 NOTE — Progress Notes (Signed)
Pt showed me his 5th loose stool.  It was a large amount of dark brown/black in color.  Pt also had emesis that was thin, clear to yellow in color.  Will continue to monitor.

## 2012-03-02 NOTE — Progress Notes (Signed)
PT REPORTS THAT HE HAD 4 WATERY STOOLS LAST NIGHT.

## 2012-03-02 NOTE — Progress Notes (Signed)
Patient ID: David Harps., male   DOB: 26-Aug-1935, 77 y.o.   MRN: [id number] 2 Days Post-Op  Subjective: Was nauseated and vomited last night. This seems better this morning. He is having loose bowel movements. Still does not want to drink much. His pain is better today and minimal  Objective: Vital signs in last 24 hours: Temp:  [98.3 F (36.8 C)-99.2 F (37.3 C)] 98.3 F (36.8 C) (07/04 0609) Pulse Rate:  [70-80] 75  (07/04 0609) Resp:  [16-18] 18  (07/04 0609) BP: (129-135)/(65-79) 132/79 mmHg (07/04 0609) SpO2:  [96 %-99 %] 97 % (07/04 0609) Last BM Date: 03/01/12  Intake/Output from previous day: 07/03 0701 - 07/04 0700 In: 2982.1 [P.O.:480; I.V.:2447.1; IV Piggyback:55] Out: 1350 [Urine:1350] Intake/Output this shift:    General appearance: alert and no distress GI: normal findings: soft, non-tender and nondistended Incision/Wound: dressings intact clean and dry  Lab Results:   Basename 03/01/12 0431 02/29/12 1829  WBC 16.6* 17.3*  HGB 8.9* 9.2*  HCT 28.7* 29.2*  PLT 365 320   BMET  Basename 03/01/12 0431 02/29/12 1829  NA 132* --  K 4.9 --  CL 105 --  CO2 22 --  GLUCOSE 125* --  BUN 13 --  CREATININE 1.09 1.01  CALCIUM 8.3* --     Studies/Results: No results found.  Anti-infectives: Anti-infectives     Start     Dose/Rate Route Frequency Ordered Stop   02/29/12 2200   cefOXitin (MEFOXIN) 2 g in dextrose 5 % 50 mL IVPB        2 g 100 mL/hr over 30 Minutes Intravenous 3 times per day 02/29/12 1745 03/01/12 1439   02/29/12 1027   cefOXitin (MEFOXIN) 2 g in dextrose 5 % 50 mL IVPB        2 g 100 mL/hr over 30 Minutes Intravenous 60 min pre-op 02/29/12 1027 02/29/12 1330          Assessment/Plan: s/p Procedure(s): LAPAROSCOPIC RIGHT COLECTOMY PARTIAL COLECTOMY Stable postop. A lot of nausea yesterday but this is resolving. His abdomen seems benign. We'll continue clear liquids as tolerated today. Recheck lab work tomorrow   LOS: 2  days    Carrisa Keller T 03/02/2012  mn

## 2012-03-03 LAB — BASIC METABOLIC PANEL WITH GFR
BUN: 10 mg/dL (ref 6–23)
CO2: 24 meq/L (ref 19–32)
Calcium: 8.2 mg/dL — ABNORMAL LOW (ref 8.4–10.5)
Chloride: 106 meq/L (ref 96–112)
Creatinine, Ser: 1.06 mg/dL (ref 0.50–1.35)
GFR calc Af Amer: 77 mL/min — ABNORMAL LOW
GFR calc non Af Amer: 67 mL/min — ABNORMAL LOW
Glucose, Bld: 97 mg/dL (ref 70–99)
Potassium: 3.9 meq/L (ref 3.5–5.1)
Sodium: 136 meq/L (ref 135–145)

## 2012-03-03 LAB — CBC
HCT: 24.7 % — ABNORMAL LOW (ref 39.0–52.0)
MCHC: 30.8 g/dL (ref 30.0–36.0)
Platelets: 324 10*3/uL (ref 150–400)
RDW: 15.4 % (ref 11.5–15.5)
WBC: 11.8 10*3/uL — ABNORMAL HIGH (ref 4.0–10.5)

## 2012-03-03 MED ORDER — KETOROLAC TROMETHAMINE 10 MG PO TABS
10.0000 mg | ORAL_TABLET | Freq: Four times a day (QID) | ORAL | Status: DC | PRN
  Filled 2012-03-03: qty 1

## 2012-03-03 MED ORDER — METOCLOPRAMIDE HCL 5 MG/ML IJ SOLN
10.0000 mg | Freq: Four times a day (QID) | INTRAMUSCULAR | Status: DC
  Administered 2012-03-03 – 2012-03-04 (×4): 10 mg via INTRAVENOUS
  Filled 2012-03-03 (×8): qty 2

## 2012-03-03 MED ORDER — ENOXAPARIN SODIUM 40 MG/0.4ML ~~LOC~~ SOLN
40.0000 mg | SUBCUTANEOUS | Status: DC
  Administered 2012-03-03 – 2012-03-05 (×3): 40 mg via SUBCUTANEOUS
  Filled 2012-03-03 (×4): qty 0.4

## 2012-03-03 NOTE — Progress Notes (Addendum)
3 Days Post-Op  Subjective: Stable, alert. Voiding normally. Is ambulating in halls. Denies chest pain or shortness of breath. Having loose bowel movements.  Only problem is continued nausea., vomited small amount yesterday. He says he gets nauseated after the heparin injections. Not really hungry either. Minimal abdominal discomfort.  Objective: Vital signs in last 24 hours: Temp:  [98.5 F (36.9 C)-99.9 F (37.7 C)] 98.5 F (36.9 C) (07/05 0545) Pulse Rate:  [74-87] 87  (07/04 2100) Resp:  [18-20] 20  (07/05 0545) BP: (127-162)/(71-89) 156/82 mmHg (07/05 0545) SpO2:  [96 %-99 %] 96 % (07/05 0545) Last BM Date: 03/01/12  Intake/Output from previous day: 07/04 0701 - 07/05 0700 In: 3146.7 [P.O.:640; I.V.:2506.7] Out: 2425 [Urine:2425] Intake/Output this shift:    General appearance: alert. Mental status normal. In no distress. Skin warm and dry. Resp: clear to auscultation bilaterally GI: abdomen is soft. Not distended. Hypoactive bowel sounds. Minimal tenderness. Benign exam. Wounds looked okay.  Lab Results:  Results for orders placed during the hospital encounter of 02/29/12 (from the past 24 hour(s))  CBC     Status: Abnormal   Collection Time   03/03/12  4:30 AM      Component Value Range   WBC 11.8 (*) 4.0 - 10.5 K/uL   RBC 3.14 (*) 4.22 - 5.81 MIL/uL   Hemoglobin 7.6 (*) 13.0 - 17.0 g/dL   HCT 62.1 (*) 30.8 - 65.7 %   MCV 78.7  78.0 - 100.0 fL   MCH 24.2 (*) 26.0 - 34.0 pg   MCHC 30.8  30.0 - 36.0 g/dL   RDW 84.6  96.2 - 95.2 %   Platelets 324  150 - 400 K/uL  BASIC METABOLIC PANEL     Status: Abnormal   Collection Time   03/03/12  4:30 AM      Component Value Range   Sodium 136  135 - 145 mEq/L   Potassium 3.9  3.5 - 5.1 mEq/L   Chloride 106  96 - 112 mEq/L   CO2 24  19 - 32 mEq/L   Glucose, Bld 97  70 - 99 mg/dL   BUN 10  6 - 23 mg/dL   Creatinine, Ser 8.41  0.50 - 1.35 mg/dL   Calcium 8.2 (*) 8.4 - 10.5 mg/dL   GFR calc non Af Amer 67 (*) >90 mL/min   GFR calc Af Amer 77 (*) >90 mL/min     Studies/Results: @RISRSLT24 @     . antiseptic oral rinse  15 mL Mouth Rinse q12n4p  . chlorhexidine  15 mL Mouth Rinse BID  . citalopram  10 mg Oral q morning - 10a  . heparin  5,000 Units Subcutaneous Q8H  . latanoprost  1 drop Both Eyes QHS  . losartan  50 mg Oral q morning - 10a  . metoprolol  50 mg Oral BID  . pantoprazole  40 mg Oral Q1200     Assessment/Plan: s/p Procedure(s): LAPAROSCOPIC RIGHT COLECTOMY PARTIAL COLECTOMY   POD #3. Stable. Nausea, abdomen seems benign. Suspect mild foregut ileus. Electrolyte balance good. Will try the Reglan, Toradol for pain in hopes of reducing narcotics. No other meds obviously implicated Switch from heparin to Lovenox to reduce number of injections per day. Await pathology. Med- onc consult as outpatient.  Coronary artery disease. Stable. Asymptomatic. Continue beta blocker  Anemia. Combination of chronic and acute blood loss anemia. I do not think that he is actively bleeding. I think this is reequilibrated. No indication for transfusion unless hemoglobin  less than 7.  Hypertension. Stable. Continue Cozaar   Addendum:   (2:45 p.m.)     Pathology report shows adenocarcinoma of the cecum, 2.5 cm diameter, positive lympho-vascular invasion, 2/20 lymph nodes showed metastatic adenocarcinoma, margins negative. I called Mr. Baby and discussed this on the phone with him. He is aware of his pathologic findings.    LOS: 3 days    [facility] M. Derrell Lolling, M.D., [facility] General and Minimally invasive Surgery Breast and Colorectal Surgery Office:   [phone] Pager:   [phone]  03/03/2012  . .prob

## 2012-03-04 NOTE — Progress Notes (Signed)
Patient ID: David Pugh., male   DOB: 1935-02-19, 77 y.o.   MRN: [id number] [facility] Progress Note:   4 Days Post-Op  Subjective: Mental status is clear.  Complaining of diarrhea as he returns from the toilet.   Objective: Vital signs in last 24 hours: Temp:  [98.1 F (36.7 C)-99.5 F (37.5 C)] 98.1 F (36.7 C) (07/06 0603) Pulse Rate:  [70-83] 70  (07/06 0603) Resp:  [20] 20  (07/06 0603) BP: (146-157)/(72-82) 157/82 mmHg (07/06 0603) SpO2:  [95 %-99 %] 95 % (07/06 0603)  Intake/Output from previous day: 07/05 0701 - 07/06 0700 In: 1393.3 [P.O.:240; I.V.:1153.3] Out: 1475 [Urine:1475] Intake/Output this shift:    Physical Exam: Work of breathing is  Normal.  Incisions covered.  Minimal pain.    Lab Results:  Results for orders placed during the hospital encounter of 02/29/12 (from the past 48 hour(s))  CBC     Status: Abnormal   Collection Time   03/03/12  4:30 AM      Component Value Range Comment   WBC 11.8 (*) 4.0 - 10.5 K/uL    RBC 3.14 (*) 4.22 - 5.81 MIL/uL    Hemoglobin 7.6 (*) 13.0 - 17.0 g/dL    HCT 78.2 (*) 95.6 - 52.0 %    MCV 78.7  78.0 - 100.0 fL    MCH 24.2 (*) 26.0 - 34.0 pg    MCHC 30.8  30.0 - 36.0 g/dL    RDW 21.3  08.6 - 57.8 %    Platelets 324  150 - 400 K/uL   BASIC METABOLIC PANEL     Status: Abnormal   Collection Time   03/03/12  4:30 AM      Component Value Range Comment   Sodium 136  135 - 145 mEq/L    Potassium 3.9  3.5 - 5.1 mEq/L    Chloride 106  96 - 112 mEq/L    CO2 24  19 - 32 mEq/L    Glucose, Bld 97  70 - 99 mg/dL    BUN 10  6 - 23 mg/dL    Creatinine, Ser 4.69  0.50 - 1.35 mg/dL    Calcium 8.2 (*) 8.4 - 10.5 mg/dL    GFR calc non Af Amer 67 (*) >90 mL/min    GFR calc Af Amer 77 (*) >90 mL/min     Radiology/Results: No results found.  Anti-infectives: Anti-infectives     Start     Dose/Rate Route Frequency Ordered Stop   02/29/12 2200   cefOXitin (MEFOXIN) 2 g in dextrose 5 % 50 mL IVPB        2 g 100  mL/hr over 30 Minutes Intravenous 3 times per day 02/29/12 1745 03/01/12 1439   02/29/12 1027   cefOXitin (MEFOXIN) 2 g in dextrose 5 % 50 mL IVPB        2 g 100 mL/hr over 30 Minutes Intravenous 60 min pre-op 02/29/12 1027 02/29/12 1330          Assessment/Plan: Problem List: Patient Active Problem List  Diagnosis  . PREMATURE VENTRICULAR CONTRACTIONS  . GERD  . Positive occult stool blood test    No nausea with Zofran.  Will hold Reglan and advance to full liquid diet and assess stool consistency.  Otherwise doing well.  4 Days Post-Op    LOS: 4 days   Matt B. Daphine Deutscher, MD, [facility] [phone] beeper [phone]  03/04/2012 8:09 AM

## 2012-03-05 NOTE — Progress Notes (Signed)
Patient ID: David Harps., male   DOB: [birth year], 77 y.o.   MRN: [id number] [facility] Surgery Progress Note:   5 Days Post-Op  Subjective: Mental status is clear.  Up walking around.  No complaints and is feeling better Objective: Vital signs in last 24 hours: Temp:  [98.1 F (36.7 C)-98.9 F (37.2 C)] 98.1 F (36.7 C) (07/07 0635) Pulse Rate:  [64-83] 64  (07/07 0635) Resp:  [16-20] 18  (07/07 0635) BP: (144-160)/(72-80) 159/76 mmHg (07/07 0635) SpO2:  [97 %-99 %] 98 % (07/07 0635)  Intake/Output from previous day: 07/06 0701 - 07/07 0700 In: 4086.7 [P.O.:600; I.V.:3486.7] Out: 1225 [Urine:1225] Intake/Output this shift:    Physical Exam: Work of breathing is  Normal.  Loose stools are better after stopping reglan.    Lab Results:  No results found for this or any previous visit (from the past 48 hour(s)).  Radiology/Results: No results found.  Anti-infectives: Anti-infectives     Start     Dose/Rate Route Frequency Ordered Stop   02/29/12 2200   cefOXitin (MEFOXIN) 2 g in dextrose 5 % 50 mL IVPB        2 g 100 mL/hr over 30 Minutes Intravenous 3 times per day 02/29/12 1745 03/01/12 1439   02/29/12 1027   cefOXitin (MEFOXIN) 2 g in dextrose 5 % 50 mL IVPB        2 g 100 mL/hr over 30 Minutes Intravenous 60 min pre-op 02/29/12 1027 02/29/12 1330          Assessment/Plan: Problem List: Patient Active Problem List  Diagnosis  . PREMATURE VENTRICULAR CONTRACTIONS  . GERD  . Positive occult stool blood test    Ready to advance to regular diet.  Will do that today in anticipation of discharge tomorrow.  IV rate decreased.  5 Days Post-Op    LOS: 5 days   Matt B. Daphine Deutscher, MD, [facility] [phone] beeper [phone]  03/05/2012 8:22 AM

## 2012-03-06 ENCOUNTER — Other Ambulatory Visit (INDEPENDENT_AMBULATORY_CARE_PROVIDER_SITE_OTHER): Payer: Self-pay | Admitting: General Surgery

## 2012-03-06 DIAGNOSIS — C182 Malignant neoplasm of ascending colon: Secondary | ICD-10-CM

## 2012-03-06 MED ORDER — HYDROCODONE-ACETAMINOPHEN 5-325 MG PO TABS: 1.0000 | ORAL_TABLET | ORAL | Status: DC | PRN

## 2012-03-06 MED ORDER — ONDANSETRON HCL 4 MG PO TABS: 4.0000 mg | ORAL_TABLET | Freq: Three times a day (TID) | ORAL | Status: AC | PRN

## 2012-03-06 NOTE — Progress Notes (Signed)
6 Days Post-Op  Subjective: Doing well. Tolerating regular diet. Having stools which are now very very soft. No pain. No nausea. Wants to go home.  Objective: Vital signs in last 24 hours: Temp:  [98.1 F (36.7 C)-99.2 F (37.3 C)] 98.4 F (36.9 C) (07/07 2124) Pulse Rate:  [64-75] 75  (07/07 2114) Resp:  [18] 18  (07/07 2124) BP: (130-159)/(74-79) 158/79 mmHg (07/07 2114) SpO2:  [97 %-98 %] 97 % (07/07 2124) Last BM Date: 03/05/12  Intake/Output from previous day: 07/07 0701 - 07/08 0700 In: 747 [P.O.:240; I.V.:507] Out: 975 [Urine:975] Intake/Output this shift: Total I/O In: -  Out: 300 [Urine:300]  General appearance: comfortable. All work. Good spirits. No distress. Mental status normal. GI: abdomen soft. Scaphoid. Nontender. Wounds looked fine. Telfa wicks removed.  Lab Results:  No results found for this or any previous visit (from the past 24 hour(s)).   Studies/Results: @RISRSLT24 @     . antiseptic oral rinse  15 mL Mouth Rinse q12n4p  . chlorhexidine  15 mL Mouth Rinse BID  . citalopram  10 mg Oral q morning - 10a  . enoxaparin (LOVENOX) injection  40 mg Subcutaneous Q24H  . latanoprost  1 drop Both Eyes QHS  . losartan  50 mg Oral q morning - 10a  . metoprolol  50 mg Oral BID  . pantoprazole  40 mg Oral Q1200     Assessment/Plan: s/p Procedure(s): LAPAROSCOPIC RIGHT COLECTOMY PARTIAL COLECTOMY  POD #6. Doing very well. Discharge home today. Diet and activities discussed. Prescription for Vicodin and Zofran given the patient's request.  Followup with me in office in one week for staple removal.  We'll make referral to medical oncology as outpatient.  Patient Active Hospital Problem List: No active hospital problems.   LOS: 6 days    Sarae Nicholes M. Derrell Lolling, M.D., [facility] General and Minimally invasive Surgery Breast and Colorectal Surgery Office:   [phone] Pager:   [phone]  03/06/2012  . .prob

## 2012-03-06 NOTE — Discharge Summary (Signed)
Patient ID: David Pugh [id number] 77 y.o. 1935/06/27  02/29/2012  Discharge date and time: March 06, 2012  Admitting Physician: Ernestene Mention  Discharge Physician: Ernestene Mention  Admission Diagnoses: CECAL MASS  Discharge Diagnoses: adenocarcinoma of cecum, stage T3, N1c, 2/20 nodes positive.  Operations: Procedure(s): LAPAROSCOPIC RIGHT COLECTOMY PARTIAL COLECTOMY  Admission Condition: good  Discharged Condition: good  Indication for Admission: David Pugh. is a 77 y.o. male. He is referred by Dr. Jonathon Bellows in [place] for management of a malignant mass of the cecum. His primary care physician is Dr. Wyvonnia Lora. His cardiologist is Dr. Rachelle Hora Croitoru.  The patient was recently found to have Hemoccult-positive stools and underwent colonoscopy. In the cecum there is a malignant ulcerated mass with rolled edges about 2 cm in diameter. Biopsies show adenocarcinoma.  In the descending and sigmoid colon he removed 3 or 4 benign-appearing polyps. That pathology showed one fragment of high grade dysplasia, thought to be completely resected. 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 The patient is not very symptomatic he doesn't see any blood in his stools. He has chronic diarrhea and irritable bowel syndrome. He doesn't have any abdominal pain.  Labwork reveals CEA at 1.2, liver function tests normal, hemoglobin 10.6. CT shows no metastatic disease.  Significant comorbidities include the prostate cancer, coronary artery disease, status post cardiac cath with 2 drug-eluting stents placed June 2012, on aspirin and Plavix. Hypertension. Cataract surgery. Laparoscopic cholecystectomy 2001. He has undergone a bowel prep at home and is brought to the hospital and operating room electively.   Hospital Course: on the day of admission the patient was taken to the operating room and underwent a laparoscopic-assisted right  colectomy. The only gross finding was a 2.5 cm thin flap ulcerated mass in the cecum.  Final pathology report showed invasive adenocarcinoma with lymphovascular invasion, 2/20 lymph nodes showed metastatic adenocarcinoma, final pathologic stage T3, N1c.  Postoperative the patient did relatively well. He had nausea and a little bit of any emesis for a couple of days. Once his ileus resolved he progressed in his diet and activities and with return of bowel movements and bowel function without difficulties.  His pathology report was discussed with him. We will refer him to a medical oncologist as an outpatient. He is aware that he will need a followup colonoscopy in 6 months.  Date of discharge his wound looked good. The staples were left in place with the intent of removing them in the office next week. Diet and activities were discussed. Referral to medical oncology as an outpatient will be arranged. He was given a prescription for Vicodin and, at his request, a prescription for Zofran..  Consults: None  Significant Diagnostic Studies: pathology  Treatments: surgery: laparoscopic-assisted right colectomy.  Disposition: Home  Patient Instructions:   David Pugh, David Pugh.  Home Medication Instructions [id number]   Printed on:03/06/12 0634  Medication Information                    B Complex-Folic Acid CAPS Take 1 capsule by mouth every morning.            citalopram (CELEXA) 10 MG tablet Take 10 mg by mouth every morning.            metoprolol (LOPRESSOR) 50 MG tablet Take 50 mg by mouth 2 (two) times daily.            latanoprost (XALATAN) 0.005 %  ophthalmic solution Place 1 drop into both eyes at bedtime.            dexlansoprazole (DEXILANT) 60 MG capsule Take 60 mg by mouth daily before breakfast.            nitroGLYCERIN (NITROSTAT) 0.4 MG SL tablet Place 0.4 mg under the tongue every 5 (five) minutes x 3 doses as needed. For chest pain           Multiple  Vitamins-Minerals (CENTRUM SILVER) tablet Take 1 tablet by mouth every morning.            losartan (COZAAR) 50 MG tablet Take 50 mg by mouth every morning.            aspirin EC 81 MG tablet Take 81 mg by mouth at bedtime.            pravastatin (PRAVACHOL) 40 MG tablet Take 40 mg by mouth daily. Pt takes at bedtime           naproxen sodium (ANAPROX) 220 MG tablet Take 220 mg by mouth 2 (two) times daily with a meal.             Activity: activity as discussed. No sports or lifting for 4 weeks. He may drive a car within 7 days. Diet: low fat, low cholesterol diet Wound Care: keep wound clean and dry  Follow-up:  With Dr. Derrell Lolling  in 1 week.  Signed: Angelia Mould. Derrell Lolling, M.D., FACS General and minimally invasive surgery Breast and Colorectal Surgery  03/06/2012, 6:34 AM

## 2012-03-06 NOTE — Progress Notes (Signed)
Patient provided with discharge instructions and prescriptions. Patient verbalized understanding. Patient discharged to home. 

## 2012-03-07 ENCOUNTER — Telehealth (INDEPENDENT_AMBULATORY_CARE_PROVIDER_SITE_OTHER): Payer: Self-pay | Admitting: General Surgery

## 2012-03-07 NOTE — Progress Notes (Signed)
Request given to Debra 03/06/12 after clinic. Copy given to [facility] 03/07/12 for pre-cert.

## 2012-03-07 NOTE — Telephone Encounter (Signed)
Called patient to advise of appointment (nurse only) on 03/13/12 at 10:00 to wound check and staple removal. Advised we are working on this referral and he will be contacted by either our office or Dr. Truett Perna with an appointment date to be seen. Patient referred to medical oncology at the request of Dr. Derrell Lolling.

## 2012-03-08 ENCOUNTER — Telehealth: Payer: Self-pay | Admitting: Oncology

## 2012-03-08 NOTE — Telephone Encounter (Signed)
S/w the pt and he is aware of his new pt appt with dr Truett Perna on 03/17/2012

## 2012-03-11 ENCOUNTER — Emergency Department (HOSPITAL_COMMUNITY): Payer: Medicare Other

## 2012-03-11 ENCOUNTER — Emergency Department (HOSPITAL_COMMUNITY)
Admission: EM | Admit: 2012-03-11 | Discharge: 2012-03-11 | Disposition: A | Discharge status: Home / Self Care | Payer: Medicare Other | Attending: Emergency Medicine | Admitting: Emergency Medicine

## 2012-03-11 ENCOUNTER — Encounter (HOSPITAL_COMMUNITY): Payer: Self-pay

## 2012-03-11 DIAGNOSIS — IMO0002 Reserved for concepts with insufficient information to code with codable children: Secondary | ICD-10-CM | POA: Insufficient documentation

## 2012-03-11 DIAGNOSIS — S0990XA Unspecified injury of head, initial encounter: Secondary | ICD-10-CM | POA: Insufficient documentation

## 2012-03-11 DIAGNOSIS — Z8546 Personal history of malignant neoplasm of prostate: Secondary | ICD-10-CM | POA: Insufficient documentation

## 2012-03-11 DIAGNOSIS — R55 Syncope and collapse: Secondary | ICD-10-CM | POA: Insufficient documentation

## 2012-03-11 DIAGNOSIS — Z7982 Long term (current) use of aspirin: Secondary | ICD-10-CM | POA: Insufficient documentation

## 2012-03-11 DIAGNOSIS — R110 Nausea: Secondary | ICD-10-CM | POA: Insufficient documentation

## 2012-03-11 DIAGNOSIS — K219 Gastro-esophageal reflux disease without esophagitis: Secondary | ICD-10-CM | POA: Insufficient documentation

## 2012-03-11 DIAGNOSIS — I10 Essential (primary) hypertension: Secondary | ICD-10-CM | POA: Insufficient documentation

## 2012-03-11 DIAGNOSIS — R42 Dizziness and giddiness: Secondary | ICD-10-CM | POA: Insufficient documentation

## 2012-03-11 DIAGNOSIS — R5381 Other malaise: Secondary | ICD-10-CM | POA: Insufficient documentation

## 2012-03-11 DIAGNOSIS — R296 Repeated falls: Secondary | ICD-10-CM | POA: Insufficient documentation

## 2012-03-11 DIAGNOSIS — Z79899 Other long term (current) drug therapy: Secondary | ICD-10-CM | POA: Insufficient documentation

## 2012-03-11 DIAGNOSIS — M129 Arthropathy, unspecified: Secondary | ICD-10-CM | POA: Insufficient documentation

## 2012-03-11 DIAGNOSIS — T148XXA Other injury of unspecified body region, initial encounter: Secondary | ICD-10-CM

## 2012-03-11 DIAGNOSIS — W19XXXA Unspecified fall, initial encounter: Secondary | ICD-10-CM

## 2012-03-11 LAB — URINALYSIS, ROUTINE W REFLEX MICROSCOPIC
Bilirubin Urine: NEGATIVE
Glucose, UA: NEGATIVE mg/dL
Hgb urine dipstick: NEGATIVE
Protein, ur: NEGATIVE mg/dL
Urobilinogen, UA: 0.2 mg/dL (ref 0.0–1.0)

## 2012-03-11 LAB — CBC WITH DIFFERENTIAL/PLATELET
Basophils Relative: 0 % (ref 0–1)
Eosinophils Absolute: 1 10*3/uL — ABNORMAL HIGH (ref 0.0–0.7)
HCT: 30 % — ABNORMAL LOW (ref 39.0–52.0)
Hemoglobin: 9.2 g/dL — ABNORMAL LOW (ref 13.0–17.0)
Lymphs Abs: 2.1 10*3/uL (ref 0.7–4.0)
MCH: 24.1 pg — ABNORMAL LOW (ref 26.0–34.0)
MCHC: 30.7 g/dL (ref 30.0–36.0)
MCV: 78.7 fL (ref 78.0–100.0)
Monocytes Absolute: 0.8 10*3/uL (ref 0.1–1.0)
Monocytes Relative: 7 % (ref 3–12)

## 2012-03-11 LAB — COMPREHENSIVE METABOLIC PANEL
ALT: 12 U/L (ref 0–53)
AST: 15 U/L (ref 0–37)
Albumin: 3.4 g/dL — ABNORMAL LOW (ref 3.5–5.2)
CO2: 26 mEq/L (ref 19–32)
Calcium: 9.2 mg/dL (ref 8.4–10.5)
Sodium: 138 mEq/L (ref 135–145)
Total Protein: 6.5 g/dL (ref 6.0–8.3)

## 2012-03-11 MED ORDER — IOHEXOL 300 MG/ML  SOLN
100.0000 mL | Freq: Once | INTRAMUSCULAR | Status: AC | PRN
  Administered 2012-03-11: 100 mL via INTRAVENOUS

## 2012-03-11 MED ORDER — ONDANSETRON HCL 4 MG/2ML IJ SOLN
4.0000 mg | Freq: Once | INTRAMUSCULAR | Status: AC
  Administered 2012-03-11: 4 mg via INTRAVENOUS
  Filled 2012-03-11: qty 2

## 2012-03-11 MED ORDER — ONDANSETRON 4 MG PO TBDP: 4.0000 mg | ORAL_TABLET | Freq: Three times a day (TID) | ORAL | Status: AC | PRN

## 2012-03-11 MED ORDER — SODIUM CHLORIDE 0.9 % IV SOLN
INTRAVENOUS | Status: DC
  Administered 2012-03-11: 11:00:00 via INTRAVENOUS

## 2012-03-11 MED ORDER — SODIUM CHLORIDE 0.9 % IV BOLUS (SEPSIS)
250.0000 mL | Freq: Once | INTRAVENOUS | Status: AC
  Administered 2012-03-11: 250 mL via INTRAVENOUS

## 2012-03-11 NOTE — ED Provider Notes (Signed)
History  This chart was scribed for Shelda Jakes, MD by Ladona Ridgel Day. This patient was seen in room APA02/APA02 and the patient's care was started at 1015.  CSN: [id number]  Arrival date & time 03/11/12  1015   First MD Initiated Contact with Patient 03/11/12 1016      Chief Complaint  Patient presents with  . Near Syncope    The history is provided by the patient. No language interpreter was used.  David Pugh. is a 77 y.o. male who presents to the Emergency Department complaining of feeling dizzy/nauseated/weak and fell while stepping out of his shower this AM. He states that he bumped his head whle falling and also complains of several skin tears on his left arm. He was recently discharged from [facility] 6 days ago following intestinal resection surgery for CA. He states that he feels fine now and is not currently dizzy nor nauseated. He denies any LOC, chest pain, HA, visual disturbances, abdominal pain, cough/congestion, emesis, and diarrhea as associated symptoms. His surgery was performed on July 2nd by Dr. Oneida Arenas.    He is followed by Dr. Gala Lewandowsky at [facility] Past Medical History  Diagnosis Date  . Premature ventricular contraction   . Prostate cancer   . 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 Radiation Feb 2011    treatment  . Biliary dyskinesia   . GERD (gastroesophageal reflux disease)   . Hiatal hernia 2010    tiny  . HTN (hypertension)   . Rosacea   . PTSD (post-traumatic stress disorder)   . Angina pectoris, unstable   . Diverticula of colon 2010  . Colon polyps     infammatory  . Arthritis   . Exposure to [employer]   . CAD (coronary artery disease)     hx of 2 stents   . Peripheral neuropathy   . Anemia     hx of anemia as a child     Past Surgical History  Procedure Date  . Cholecystectomy   . Prostatectomy   . Tonsillectomy   . Transurethral resection of prostate   . Esophagogastroduodenoscopy 10/10/2008    Dr. Maceo Pro hiatal hernia,  normal esophagus, normal stomach  . Colonoscopy 02/12/2004    Dr. Jena Gauss- L side diverticula, inflammatory  colon polyps  . Esophagogastroduodenoscopy 02/12/2004    Dr. Geni Bers- normal   . Carotid stent 02/14/11  . Cardiac catheterization 02/13/2011    [facility], [facility] cardiology  . Coronary stent placement 01/2011  . Colonoscopy 01/19/2012    Procedure: COLONOSCOPY;  Surgeon: Corbin Ade, MD;  Location: [facility] ENDO SUITE;  Service: Endoscopy;  Laterality: N/A;  12:00  . Knee surgery     left knee   . Partial colectomy 02/29/2012    Procedure: PARTIAL COLECTOMY;  Surgeon: Ernestene Mention, MD;  Location: [facility] ORS;  Service: General;  Laterality: Right;    Family History  Problem Relation Age of Onset  . Throat cancer Father   . Cancer Father     Throat  . Heart attack    . Prostate cancer Brother   . Cancer Brother     Prostate and Skin  . Colon cancer Neg Hx     History  Substance Use Topics  . Smoking status: Never Smoker   . Smokeless tobacco: Never Used  . Alcohol Use: Yes     1 cocktail/week      Review of Systems 10 Systems reviewed and are  negative for acute change except as noted in the HPI. Allergies  Shellfish allergy and Sulfonamide derivatives  Home Medications   Current Outpatient Rx  Name Route Sig Dispense Refill  . ASPIRIN EC 81 MG PO TBEC Oral Take 81 mg by mouth at bedtime.     . B COMPLEX-FOLIC ACID PO CAPS Oral Take 1 capsule by mouth every morning.     Marland Kitchen CITALOPRAM HYDROBROMIDE 10 MG PO TABS Oral Take 10 mg by mouth every morning.     . DEXLANSOPRAZOLE 60 MG PO CPDR Oral Take 60 mg by mouth daily before breakfast.     . HYDROCODONE-ACETAMINOPHEN 5-325 MG PO TABS Oral Take 1-2 tablets by mouth every 4 (four) hours as needed for pain. 50 tablet 1  . LATANOPROST 0.005 % OP SOLN Both Eyes Place 1 drop into both eyes at bedtime.     Marland Kitchen LOSARTAN POTASSIUM 50 MG PO TABS Oral Take 50 mg by mouth every morning.     Marland Kitchen METOPROLOL TARTRATE 50 MG PO TABS Oral Take 50  mg by mouth 2 (two) times daily.     . CENTRUM SILVER PO TABS Oral Take 1 tablet by mouth every morning.     Marland Kitchen NAPROXEN SODIUM 220 MG PO TABS Oral Take 220 mg by mouth 2 (two) times daily with a meal.    . NITROGLYCERIN 0.4 MG SL SUBL Sublingual Place 0.4 mg under the tongue every 5 (five) minutes x 3 doses as needed. For chest pain    . ONDANSETRON 4 MG PO TBDP Oral Take 1 tablet (4 mg total) by mouth every 8 (eight) hours as needed for nausea. 12 tablet 0  . ONDANSETRON HCL 4 MG PO TABS Oral Take 1 tablet (4 mg total) by mouth every 8 (eight) hours as needed for nausea. 20 tablet 0  . PRAVASTATIN SODIUM 40 MG PO TABS Oral Take 40 mg by mouth daily. Pt takes at bedtime      BP 118/66  Pulse 59  Temp 98.7 F (37.1 C) (Oral)  Resp 15  Ht 5\' 11"  (1.803 m)  Wt 200 lb (90.719 kg)  BMI 27.89 kg/m2  SpO2 100%  Physical Exam  Constitutional: He is oriented to person, place, and time. He appears well-developed.  HENT:  Head: Normocephalic and atraumatic.  Mouth/Throat: Oropharynx is clear and moist.  Eyes: Conjunctivae and EOM are normal.  Neck: Normal range of motion.  Cardiovascular: Normal rate, regular rhythm and normal heart sounds.   No murmur heard. Pulmonary/Chest: Effort normal and breath sounds normal. No respiratory distress. He has no wheezes. He has no rales.  Abdominal: Soft. Bowel sounds are normal. He exhibits no distension.  Neurological: He is alert and oriented to person, place, and time. Coordination normal.  Skin: Skin is warm and dry.       Surgical incisions mid abdomen healing well.  4 Small skin tears on his left arm.  Psychiatric: He has a normal mood and affect.    ED Course  Procedures (including critical care time) DIAGNOSTIC STUDIES: Oxygen Saturation is 98% on room air, normal by my interpretation.    COORDINATION OF CARE: At 1105 AM Discussed treatment plan with patient which includes IV fluids, CXR, head CT, urine analysis, and blood work. Patient  agrees.  Results for orders placed during the hospital encounter of 03/11/12  COMPREHENSIVE METABOLIC PANEL      Component Value Range   Sodium 138  135 - 145 mEq/L   Potassium 4.0  3.5 - 5.1 mEq/L   Chloride 105  96 - 112 mEq/L   CO2 26  19 - 32 mEq/L   Glucose, Bld 133 (*) 70 - 99 mg/dL   BUN 15  6 - 23 mg/dL   Creatinine, Ser 1.61  0.50 - 1.35 mg/dL   Calcium 9.2  8.4 - 09.6 mg/dL   Total Protein 6.5  6.0 - 8.3 g/dL   Albumin 3.4 (*) 3.5 - 5.2 g/dL   AST 15  0 - 37 U/L   ALT 12  0 - 53 U/L   Alkaline Phosphatase 58  39 - 117 U/L   Total Bilirubin 0.3  0.3 - 1.2 mg/dL   GFR calc non Af Amer 53 (*) >90 mL/min   GFR calc Af Amer 61 (*) >90 mL/min  CBC WITH DIFFERENTIAL      Component Value Range   WBC 11.8 (*) 4.0 - 10.5 K/uL   RBC 3.81 (*) 4.22 - 5.81 MIL/uL   Hemoglobin 9.2 (*) 13.0 - 17.0 g/dL   HCT 04.5 (*) 40.9 - 81.1 %   MCV 78.7  78.0 - 100.0 fL   MCH 24.1 (*) 26.0 - 34.0 pg   MCHC 30.7  30.0 - 36.0 g/dL   RDW 91.4 (*) 78.2 - 95.6 %   Platelets 487 (*) 150 - 400 K/uL   Neutrophils Relative 66  43 - 77 %   Neutro Abs 7.7  1.7 - 7.7 K/uL   Lymphocytes Relative 18  12 - 46 %   Lymphs Abs 2.1  0.7 - 4.0 K/uL   Monocytes Relative 7  3 - 12 %   Monocytes Absolute 0.8  0.1 - 1.0 K/uL   Eosinophils Relative 9 (*) 0 - 5 %   Eosinophils Absolute 1.0 (*) 0.0 - 0.7 K/uL   Basophils Relative 0  0 - 1 %   Basophils Absolute 0.1  0.0 - 0.1 K/uL  URINALYSIS, ROUTINE W REFLEX MICROSCOPIC      Component Value Range   Color, Urine YELLOW  YELLOW   APPearance CLEAR  CLEAR   Specific Gravity, Urine 1.010  1.005 - 1.030   pH 7.5  5.0 - 8.0   Glucose, UA NEGATIVE  NEGATIVE mg/dL   Hgb urine dipstick NEGATIVE  NEGATIVE   Bilirubin Urine NEGATIVE  NEGATIVE   Ketones, ur NEGATIVE  NEGATIVE mg/dL   Protein, ur NEGATIVE  NEGATIVE mg/dL   Urobilinogen, UA 0.2  0.0 - 1.0 mg/dL   Nitrite NEGATIVE  NEGATIVE   Leukocytes, UA NEGATIVE  NEGATIVE  LIPASE, BLOOD      Component Value Range     Lipase 203 (*) 11 - 59 U/L   Labs Reviewed  COMPREHENSIVE METABOLIC PANEL - Abnormal; Notable for the following:    Glucose, Bld 133 (*)     Albumin 3.4 (*)     GFR calc non Af Amer 53 (*)     GFR calc Af Amer 61 (*)     All other components within normal limits  CBC WITH DIFFERENTIAL - Abnormal; Notable for the following:    WBC 11.8 (*)     RBC 3.81 (*)     Hemoglobin 9.2 (*)     HCT 30.0 (*)     MCH 24.1 (*)     RDW 15.9 (*)     Platelets 487 (*)     Eosinophils Relative 9 (*)     Eosinophils Absolute 1.0 (*)     All other components  within normal limits  LIPASE, BLOOD - Abnormal; Notable for the following:    Lipase 203 (*)     All other components within normal limits  URINALYSIS, ROUTINE W REFLEX MICROSCOPIC   Dg Chest 2 View  03/11/2012  *RADIOLOGY REPORT*  Clinical Data: Near-syncope.  Recently postop from resection of colon cancer.  CHEST - 2 VIEW  Comparison: 11/20/2011  Findings: Mild bibasilar scarring again noted.  Both lungs otherwise clear.  The no evidence of pleural effusion.  Heart size is within normal limits.  No mass or lymphadenopathy identified.  IMPRESSION: Stable mild bibasilar scarring.  No active disease.  Original Report Authenticated By: Danae Orleans, M.D.   Ct Head Wo Contrast  03/11/2012  *RADIOLOGY REPORT*  Clinical Data: Near syncope.  CT HEAD WITHOUT CONTRAST  Technique:  Contiguous axial images were obtained from the base of the skull through the vertex without contrast.  Comparison: None.  Findings: The patient is positioned obliquely within the CT scanner.  There is no evidence for acute hemorrhage, mass lesion, midline shift, hydrocephalus or large infarct.  The visualized paranasal sinuses are clear. No acute bony abnormality.  IMPRESSION: No acute intracranial abnormality.  Original Report Authenticated By: Richarda Overlie, M.D.   Ct Abdomen Pelvis W Contrast  03/11/2012  *RADIOLOGY REPORT*  Clinical Data: Nausea.  Elevated lipase.  Near-syncope.   Prostate cancer and colon cancer.  CT ABDOMEN AND PELVIS WITH CONTRAST  Technique:  Multidetector CT imaging of the abdomen and pelvis was performed following the standard protocol during bolus administration of intravenous contrast.  Contrast: OMNIPAQUE IOHEXOL 300 MG/ML  SOLN  Comparison: 01/25/2012  Findings: Surgical clips again seen from prior cholecystectomy.  A tiny 1 cm cyst in the posterior right hepatic lobe is stable.  No liver masses are identified.  The pancreas is normal in appearance, with no evidence of pancreatic ductal dilatation or peri pancreatic inflammatory changes.  The spleen and adrenal glands are normal in appearance.  A tiny cyst is again seen in the lower pole left kidney.  There is no evidence of renal masses or hydronephrosis.  A few brachytherapy seeds are seen in the prostate gland which is normal in size.  No soft tissue masses or lymphadenopathy identified within the abdomen or pelvis.  Postoperative changes are seen from previous right hemicolectomy.  Mild diverticulitis is seen involving the sigmoid colon.  There is no evidence of diverticulitis or other inflammatory process.  No abnormal fluid collections are identified.  No suspicious bone lesions identified.  IMPRESSION:  1.  No acute findings.  No evidence of pancreatitis or recurrent or metastatic carcinoma. 2.  Diverticulosis.  No radiographic evidence of diverticulitis.  Original Report Authenticated By: Danae Orleans, M.D.    Date: 03/11/2012  Rate: 61  Rhythm: normal sinus rhythm  QRS Axis: normal  Intervals: normal  ST/T Wave abnormalities: normal  Conduction Disutrbances:none  Narrative Interpretation:   Old EKG Reviewed: none available    1. Fall   2. Abrasion   3. Head injury   4. Near syncope       MDM   Patient with near-syncopal episode getting out of shower and then a fall minor injury to the head a skin tear to the left forearm is proceeded by dizziness and nausea labs were normal  except for an elevated lipase this led to CT abdomen which shows no abnormalities no inflammation of the pancreas. Patient feels better now EKG without acute changes chest x-ray without pneumothorax  or pneumonia no significant anemia urinalysis was normal. Patient discharged home with followup with their  primary care Dr.    I personally performed the services described in this documentation, which was scribed in my presence. The recorded information has been reviewed and considered.         Shelda Jakes, MD 03/11/12 [phone]

## 2012-03-11 NOTE — ED Notes (Signed)
Pt was getting out of shower today and became dizzy  And weak, fell, injured left arm, denies any loc, no sob or cp.  Just released from [place] on Monday following surgery.

## 2012-03-13 ENCOUNTER — Encounter (INDEPENDENT_AMBULATORY_CARE_PROVIDER_SITE_OTHER): Payer: Self-pay | Admitting: General Surgery

## 2012-03-13 ENCOUNTER — Ambulatory Visit (INDEPENDENT_AMBULATORY_CARE_PROVIDER_SITE_OTHER): Payer: Medicare Other | Admitting: General Surgery

## 2012-03-13 VITALS — BP 128/80 | Temp 99.2°F | Ht 71.0 in | Wt 208.8 lb

## 2012-03-13 DIAGNOSIS — Z4802 Encounter for removal of sutures: Secondary | ICD-10-CM

## 2012-03-14 ENCOUNTER — Telehealth (INDEPENDENT_AMBULATORY_CARE_PROVIDER_SITE_OTHER): Payer: Self-pay | Admitting: General Surgery

## 2012-03-14 NOTE — Telephone Encounter (Signed)
Patient called back based on message left and advised of appointment with Derrell Lolling on 03/24/12 at 3:45. Patient stated the incision site does not show any signs of infection, redness of discharge. Advised patient that if any of those things occur, or fever, to contact out office. Patient agreed.

## 2012-03-17 ENCOUNTER — Ambulatory Visit (HOSPITAL_BASED_OUTPATIENT_CLINIC_OR_DEPARTMENT_OTHER): Payer: Medicare Other | Admitting: Oncology

## 2012-03-17 ENCOUNTER — Ambulatory Visit: Payer: Medicare Other

## 2012-03-17 ENCOUNTER — Encounter: Payer: Self-pay | Admitting: Oncology

## 2012-03-17 VITALS — BP 115/66 | HR 67 | Temp 98.2°F | Ht 71.0 in | Wt 208.8 lb

## 2012-03-17 DIAGNOSIS — C180 Malignant neoplasm of cecum: Secondary | ICD-10-CM

## 2012-03-17 DIAGNOSIS — G609 Hereditary and idiopathic neuropathy, unspecified: Secondary | ICD-10-CM

## 2012-03-17 DIAGNOSIS — D509 Iron deficiency anemia, unspecified: Secondary | ICD-10-CM

## 2012-03-17 DIAGNOSIS — C189 Malignant neoplasm of colon, unspecified: Secondary | ICD-10-CM

## 2012-03-17 DIAGNOSIS — D126 Benign neoplasm of colon, unspecified: Secondary | ICD-10-CM

## 2012-03-17 NOTE — Progress Notes (Signed)
[facility] New Patient Consult   Referring MD: Cassius Cullinane. 77 y.o.  March 22, 1935    Reason for Referral: Colon cancer     HPI: He was found to have a Hemoccult positive stool on a routine physical. He was referred to Dr. Carolan Clines and underwent a colonoscopy on 01/19/2012. A friable distal rectal mucosa with neovascular changes consistent with radiation induced injury was noted. A 9 mm polyp was noted in the mid sigmoid colon, 3 descending colon polyps, and a 1.5 x 2 cm sessile ulcerated lesion was noted at the base of the cecum between the appendiceal orifice and the ileocecal valve. The polyps were removed and the cecal lesion was biopsied. The pathology from the cecal biopsy confirmed adenocarcinoma. The descending colon polyps revealed fragments of tubular adenoma with one fragment showing focal high-grade dysplasia. He polyps from the sigmoid colon or a sessile serrated adenoma without high-grade dysplasia or malignancy. CTs of the abdomen and pelvis on 01/25/2012 found the lung bases to be clear. 8 low-density structure in the right hepatic lobe was stable compared to 2007 and felt to represent a cyst. The pancreas and spleen appeared normal. The adrenal glands appeared normal. Seed implants were then 5 within the prostate gland. No upper abdominal or pelvic adenopathy. No inguinal adenopathy. Along the posterior wall of the cecum a mild soft tissue thickening was noted. A single subcentimeter ileocolic lymph node adjacent to the cecum measured 0.8 cm. No obstructing mass. Multiple sigmoid diverticula. No ascites. No peritoneal nodule or mass.  He was referred to Dr. Derrell Lolling and was taken to the operating room on 02/29/2012 and underwent a laparoscopic-assisted right colectomy. A 2 cm ulcerated mass was noted in the cecum adjacent to the ileocecal valve. No palpable adenopathy. The liver appeared normal. The tumor was resected and anastomosis was  created between the terminal ileum and mid transverse colon.  The pathology confirmed an invasive moderately differentiated adenocarcinoma. Tumor invaded through the muscular propria into the pericolonic fat. Angiolymphatic invasion was noted to. 2 of 20 lymph nodes were positive for metastatic carcinoma and a tumor deposit was present. The resection margins were negative. The tumor was located in the cecum immediately adjacent to the appendiceal orifice.  He is referred to consider adjuvant treatment options. He had a "dizzy "spell when getting out of the shower on 03/11/2012 and fell in the bathroom. He was evaluated in the emergency room. CT scans showed no acute changes.  Past Medical History  Diagnosis Date  . Premature ventricular contraction   . Prostate cancer-treated with radiation   2010   . 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 Luciana Axe      Left  . Radiation Feb 2011    treatment  . Biliary dyskinesia   . GERD (gastroesophageal reflux disease)   . Hiatal hernia 2010    tiny  . HTN (hypertension)   . Rosacea   . PTSD (post-traumatic stress disorder)   .  history of unstable angina    . Diverticula of colon 2010  .  irritable bowel syndrome    .   admission with an "FUO "  1969   . Arthritis   . Exposure to [employer]   . CAD (coronary artery disease)  2012     hx of 2 stents   . Peripheral neuropathy   . Anemia     hx of anemia as a child     Past Surgical History  Procedure Date  . Cholecystectomy  2010       . Tonsillectomy  88   . Transurethral resection of prostate   . Esophagogastroduodenoscopy 10/10/2008    Dr. Maceo Pro hiatal hernia, normal esophagus, normal stomach  . Colonoscopy 02/12/2004    Dr. Jena Gauss- L side diverticula, inflammatory  colon polyps  . Esophagogastroduodenoscopy 02/12/2004    Dr. Geni Bers- normal   . Carotid stent 02/14/11  . Cardiac catheterization 02/13/2011     [facility], [facility] cardiology  . Coronary stent placement 01/2011  . Colonoscopy 01/19/2012    Procedure: COLONOSCOPY;  Surgeon: Corbin Ade, MD;  Location: [facility] END[facility] SU[facility];  Service: Endoscopy;  Laterality: N/A;  12:00  . Knee surgery     left knee   . Partial colectomy 02/29/2012    Procedure: PARTIAL COLECTOMY;  Surgeon: Ernestene Mention, MD;  Location: [facility] ORS;  Service: General;  Laterality: Right;    Family History  Problem Relation Age of Onset  . Throat cancer Father   . Cancer Father     Throat  . Heart attack    . Prostate cancer Brother   . Cancer Brother     Prostate and Skin  . Colon cancer Neg Hx   No other family history of cancer  Current outpatient prescriptions:aspirin EC 81 MG tablet, Take 81 mg by mouth at bedtime. , Disp: , Rfl: ;  B Complex-Folic Acid CAPS, Take 1 capsule by mouth every morning. , Disp: , Rfl: ;  citalopram (CELEXA) 10 MG tablet, Take 10 mg by mouth every morning. , Disp: , Rfl: ;  dexlansoprazole (DEXILANT) 60 MG capsule, Take 60 mg by mouth daily before breakfast. , Disp: , Rfl:  latanoprost (XALATAN) 0.005 % ophthalmic solution, Place 1 drop into both eyes at bedtime. , Disp: , Rfl: ;  losartan (COZAAR) 50 MG tablet, Take 50 mg by mouth every morning. , Disp: , Rfl: ;  metoprolol (LOPRESSOR) 50 MG tablet, Take 50 mg by mouth 2 (two) times daily. , Disp: , Rfl: ;  Multiple Vitamins-Minerals (CENTRUM SILVER) tablet, Take 1 tablet by mouth every morning. , Disp: , Rfl:  naproxen sodium (ANAPROX) 220 MG tablet, Take 220 mg by mouth 2 (two) times daily with a meal., Disp: , Rfl: ;  nitroGLYCERIN (NITROSTAT) 0.4 MG SL tablet, Place 0.4 mg under the tongue every 5 (five) minutes x 3 doses as needed. For chest pain, Disp: , Rfl: ;  ondansetron (ZOFRAN ODT) 4 MG disintegrating tablet, Take 1 tablet (4 mg total) by mouth every 8 (eight) hours as needed for nausea., Disp: 12 tablet, Rfl: 0 pravastatin (PRAVACHOL) 40 MG tablet, Take 40 mg by mouth daily. Pt takes at  bedtime, Disp: , Rfl: ;  Probiotic Product (PROBIOTIC DAILY PO), Take 1 capsule by mouth daily. Florajen-3, Disp: , Rfl:   Allergies:  Allergies  Allergen Reactions  . Shellfish Allergy Anaphylaxis  . Sulfonamide Derivatives Diarrhea    Social History: He lives in [place]. He is retired from [employer]. 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 He does not use tobacco . No transfusion history. No risk factor for  HIV or hepatitis  History  Alcohol Use  . Yes    1 cocktail/week    History  Smoking status  . Never Smoker   Smokeless tobacco  . 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  Physical Exam:  Blood pressure 115/66, pulse 67, temperature 98.2 F (36.8 C), temperature source Oral, height 5\' 11"  (1.803 m), weight 208 lb 12.8 oz (94.711 kg).  HEENT: Oropharynx without visible mass, neck without mass  Lungs: Clear bilaterally  Cardiac: Regular rate and rhythm  Abdomen: No hepatosplenomegaly, no mass, healed incisions  GU: Testes without mass, uncircumcised  Vascular: No leg edema  Lymph nodes: No cervical, supraclavicular, axillary, or inguinal nodes  Neurologic: The sensation to light touch and pinprick is intact at the toes and feet. Market decrease in vibratory sense at the toes. Mild decrease in vibratory sense at the fingertips.  Skin: Multiple benign appearing moles over the trunk    LAB:  CBC  Lab Results  Component Value Date   WBC 11.8* 03/11/2012   HGB 9.2* 03/11/2012   HCT 30.0* 03/11/2012   MCV 78.7 03/11/2012   PLT 487* 03/11/2012   Hemoglobin 9.7, MCV 79.1 on 02/22/2012   CMP      Component Value Date/Time   NA 138 03/11/2012 1100   K 4.0 03/11/2012 1100     CL 105 03/11/2012 1100   CO2 26 03/11/2012 1100   GLUCOSE 133* 03/11/2012 1100   BUN 15 03/11/2012 1100   CREATININE 1.28 03/11/2012 1100   CALCIUM 9.2 03/11/2012 1100   PROT 6.5 03/11/2012 1100   ALBUMIN 3.4* 03/11/2012 1100   AST 15 03/11/2012 1100   ALT 12 03/11/2012 1100   ALKPHOS 58 03/11/2012 1100   BILITOT 0.3 03/11/2012 1100   GFRNONAA 53* 03/11/2012 1100   GFRAA 61* 03/11/2012 1100   CEA 1.2 on 01/19/2012  Radiology:Chest x-ray 03/11/2012-mild bibasilar scarring, lungs otherwise clear, no effusion, no mass or lymphadenopathy.    Assessment/Plan:   1.Stage III (T3 N1c), moderately differentiated adenocarcinoma of the cecum, status post a right colectomy on 02/29/2012  2. Multiple colonic polyps  3. History of prostate cancer  4. Microcytic anemia-likely iron deficiency  5. History of coronary artery disease  6. History of transurethral resection of the prostate  7. Peripheral neuropathy   Disposition:   He has been diagnosed with stage III colon cancer. I discussed the diagnosis, prognosis, and adjuvant treatment options with Mr. Borchardt and his wife. We reviewed the details of the surgical pathology report. He understands there is a significant chance of developing a recurrence of colon cancer in the absence of adjuvant therapy. I discussed the benefit associated with adjuvant 5-fluorouracil-based chemotherapy in patients with resected stage III colon cancer. We also discussed the expected benefit with the addition of oxaliplatin.   We discussed single agent capecitabine, FOLFOX, and CAPOX chemotherapy. He is not a candidate for the CALGB study (the randomizing patients to 6 versus 12 cycles of FOLFOX) secondary to his history of prostate cancer. He is also not a candidate for the NSABP P5 prevention trial due to the current use of a lipid-lowering agent.  We reviewed the potential toxicities associated with 5-fluorouracil/capecitabine including a chance for mucositis,  diarrhea, skin  hyperpigmentation, and the hand/foot syndrome. We discussed the various types of neuropathy associated with oxaliplatin.  Mr. Milich is concerned about the potential for neuropathy. He has significant pre-existing neuropathy that interfered with function at present.  He would like to attend chemotherapy teaching class for further discussion. His initial indication is that he would like to be treated with single agent capecitabine.  He lives close to the [facility] clinic and would like to be treated there. We will make your throat to Dr. Mariel Sleet. We will be glad to see him in the future as needed.  He appears to have iron deficiency and should begin a trial of iron replacement therapy.    Tallyn Holroyd 03/17/2012, 8:07 PM

## 2012-03-17 NOTE — Progress Notes (Signed)
Patient came in today as a new patient and he has three insurance,the patient said he should be [place] as far as financial assistance.

## 2012-03-20 ENCOUNTER — Telehealth (INDEPENDENT_AMBULATORY_CARE_PROVIDER_SITE_OTHER): Payer: Self-pay | Admitting: General Surgery

## 2012-03-20 ENCOUNTER — Telehealth: Payer: Self-pay | Admitting: *Deleted

## 2012-03-20 NOTE — Telephone Encounter (Signed)
Called patient to advise of change in appointment due to a surgery having been scheduled at that time. It has been moved to 03/29/12 at 5:00. Patient agreed.

## 2012-03-20 NOTE — Telephone Encounter (Signed)
made patient appointment for dr.[email or web address] on 03-22-2012 at 2:30pm

## 2012-03-20 NOTE — Telephone Encounter (Signed)
made patient appointment with dr, neighjstion on 03-24-2012 at 2:30pm

## 2012-03-23 ENCOUNTER — Other Ambulatory Visit: Payer: Medicare Other

## 2012-03-23 ENCOUNTER — Encounter: Payer: Self-pay | Admitting: *Deleted

## 2012-03-24 ENCOUNTER — Encounter (HOSPITAL_COMMUNITY): Payer: Self-pay | Admitting: Oncology

## 2012-03-24 ENCOUNTER — Encounter (INDEPENDENT_AMBULATORY_CARE_PROVIDER_SITE_OTHER): Payer: Medicare Other | Admitting: General Surgery

## 2012-03-24 ENCOUNTER — Encounter (HOSPITAL_COMMUNITY): Payer: Medicare Other | Attending: Oncology | Admitting: Oncology

## 2012-03-24 VITALS — BP 101/62 | HR 69 | Temp 98.8°F | Ht 70.0 in | Wt 214.0 lb

## 2012-03-24 DIAGNOSIS — G609 Hereditary and idiopathic neuropathy, unspecified: Secondary | ICD-10-CM

## 2012-03-24 DIAGNOSIS — I519 Heart disease, unspecified: Secondary | ICD-10-CM | POA: Insufficient documentation

## 2012-03-24 DIAGNOSIS — Z79899 Other long term (current) drug therapy: Secondary | ICD-10-CM | POA: Insufficient documentation

## 2012-03-24 DIAGNOSIS — C189 Malignant neoplasm of colon, unspecified: Secondary | ICD-10-CM | POA: Insufficient documentation

## 2012-03-24 DIAGNOSIS — Z7982 Long term (current) use of aspirin: Secondary | ICD-10-CM | POA: Insufficient documentation

## 2012-03-24 DIAGNOSIS — K589 Irritable bowel syndrome without diarrhea: Secondary | ICD-10-CM | POA: Insufficient documentation

## 2012-03-24 DIAGNOSIS — I10 Essential (primary) hypertension: Secondary | ICD-10-CM | POA: Insufficient documentation

## 2012-03-24 DIAGNOSIS — F4310 Post-traumatic stress disorder, unspecified: Secondary | ICD-10-CM | POA: Insufficient documentation

## 2012-03-24 DIAGNOSIS — C779 Secondary and unspecified malignant neoplasm of lymph node, unspecified: Secondary | ICD-10-CM

## 2012-03-24 DIAGNOSIS — C180 Malignant neoplasm of cecum: Secondary | ICD-10-CM

## 2012-03-24 DIAGNOSIS — D649 Anemia, unspecified: Secondary | ICD-10-CM

## 2012-03-24 DIAGNOSIS — L719 Rosacea, unspecified: Secondary | ICD-10-CM | POA: Insufficient documentation

## 2012-03-24 DIAGNOSIS — Z8546 Personal history of malignant neoplasm of prostate: Secondary | ICD-10-CM | POA: Insufficient documentation

## 2012-03-24 MED ORDER — ALPRAZOLAM 0.25 MG PO TABS: 0.2500 mg | ORAL_TABLET | Freq: Every evening | ORAL | Status: DC | PRN

## 2012-03-24 MED ORDER — CAPECITABINE 500 MG PO TABS: ORAL_TABLET | ORAL | Status: DC

## 2012-03-24 MED ORDER — ONDANSETRON HCL 8 MG PO TABS: 8.0000 mg | ORAL_TABLET | Freq: Three times a day (TID) | ORAL | Status: AC | PRN

## 2012-03-24 NOTE — Progress Notes (Signed)
David Pugh. presented for labwork. Labs per MD order drawn via Peripheral Line 23 gauge needle inserted in left AC  Good blood return present. Procedure without incident.  Needle removed intact. Patient tolerated procedure well.

## 2012-03-24 NOTE — Progress Notes (Signed)
Problem #1 stage III adenocarcinoma of colon presenting with a grade 22.5 cm cancer with LV I and 2 positive lymph nodes out of 20 examined.  Problem #2 extensive peripheral neuropathy at least grade 2. He has seen Dr. Lesia Sago for this diagnosis for approximately 10 years. He does have difficulty with balance and with walking and with clumsiness of his hands he states. Problem #3 PTSD on Celexa with some improvement after his experiences in [place] many years ago. Problem #4 hypertension problem #5 heart disease for which she is on aspirin 81 mg a day along with Pravachol for hypercholesterolemia. He also has when necessary nitroglycerin at home. He also uses naproxen for his back pain. He also is followed by Dr. 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 He has peripheral vision in his left eye only. Next is is history of rosacea. Next is history of irritable bowel syndrome with 2 bowel movements a day for many years. Finally he has a history of prostate cancer treated with radiation therapy by Dr. Dorothy Puffer in 2010. 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 This very pleasant gentleman is accompanied by his wife of 48 years. He has never smoked uses alcohol infrequently. He was found to have a guaiac positive stool which may well have been from mild radiation proctitis but was found on colonoscopy by Dr. Jena Gauss to have a small colon cancer in the cecal area. He was referred to Dr. Derrell Lolling and definitive surgery took place on 02/29/2012. His story is described by Dr. Truett Perna and Derrell Lolling. It has become clear that he should not be treated with oxaliplatinum due to his severe neuropathy which is at least grade 2 and possibly grade 3.  He we'll therefore start capecitabine 14 days on and 7 days off for 6 cycles in the hopes that we can reduce his risk of recurrent colon cancer. I think his risk of recurrence is in the range of 45-65%  without further therapy or perhaps slightly higher. With capecitabine alone I suspect we can reduce it only by about 10-20%.  His physical exam shows is vital signs are recorded. He is a very pleasant gentlema with mild rosacea of his nose. He has no lymphadenopathy. He has numerous seborrheic keratoses anteriorly and posteriorly on his trunk. He does not have gynecomastia. His heart shows a regular rhythm and rate without murmur rub or gallop. His lungs are clear. His abdomen is soft midline incision is well-healed and bowel sounds are normal. He does not have hepatosplenomegaly that I can appreciate. He has trace pretibial edema bilaterally. Facial symmetry appears intact. His left pupil does not react to light as well as the right. Throat is clear but numerous teeth are missing. Tongue is normal in the midline.  His dose of capecitabine will be 2000 mg twice a day for 14 days followed by 7 day break. I would like to check his blood counts once a week for the first cycle. I think he needs a CEA every 3 months and I think he should have a CAT scan once a year for 5 years.  He and his wife had a number of questions and we spent approximately 45 minutes to an hour discussing all of her questions and the diagnosis and the prognosis. I will see him after his first cycle of therapy. He will start the chemotherapy on August 1

## 2012-03-24 NOTE — Patient Instructions (Addendum)
David Pugh  [id number] 02/08/1935 Dr. Glenford Peers   [facility]  Discharge Instructions  RECOMMENDATIONS MADE BY THE CONSULTANT AND ANY TEST RESULTS WILL BE SENT TO YOUR REFERRING DOCTOR.   EXAM FINDINGS BY MD TODAY AND SIGNS AND SYMPTOMS TO REPORT TO CLINIC OR PRIMARY MD: will start you on Xeloda on 8/1.  Need to check CBC/diff each week during your first cycle.     MEDICATIONS PRESCRIBED: Xeloda 500 mg beginning 8/1 taking 4 pills twice daily for 14 days then off 7 days then repeat if your blood counts are stable. Follow label directions. Refill for Aprazolam   INSTRUCTIONS GIVEN AND DISCUSSED: Other :  Report uncontrolled nausea,vomiting or diarrhea, fevers, pain or peeling of your hands or feet.  SPECIAL INSTRUCTIONS/FOLLOW-UP: Lab work Needed: Weekly while on Xeloda and Return to Clinic to see MD before starting the 2nd cycle.   I acknowledge that I have been informed and understand all the instructions given to me and received a copy. I do not have any more questions at this time, but understand that I may call the Specialty Clinic at [facility] at [phone] during business hours should I have any further questions or need assistance in obtaining follow-up care.    __________________________________________  _____________  __________ Signature of Patient or Authorized Representative            Date                   Time    __________________________________________ Nurse's Signature

## 2012-03-25 LAB — IRON AND TIBC: Iron: <10 ug/dL — ABNORMAL LOW (ref 42–135)

## 2012-03-25 LAB — FERRITIN: Ferritin: 8 ng/mL — ABNORMAL LOW (ref 22–322)

## 2012-03-25 LAB — VITAMIN B12: Vitamin B-12: 305 pg/mL (ref 211–911)

## 2012-03-27 ENCOUNTER — Other Ambulatory Visit (HOSPITAL_COMMUNITY): Payer: Self-pay | Admitting: Oncology

## 2012-03-27 ENCOUNTER — Telehealth: Payer: Self-pay | Admitting: *Deleted

## 2012-03-27 DIAGNOSIS — E611 Iron deficiency: Secondary | ICD-10-CM

## 2012-03-27 DIAGNOSIS — D529 Folate deficiency anemia, unspecified: Secondary | ICD-10-CM

## 2012-03-27 MED ORDER — FOLIC ACID 1 MG PO TABS: ORAL_TABLET | ORAL | Status: DC

## 2012-03-27 MED ORDER — POLYSACCHARIDE IRON COMPLEX 100 MG/5ML PO ELIX: 100.0000 mg | ORAL_SOLUTION | Freq: Two times a day (BID) | ORAL | Status: DC

## 2012-03-27 NOTE — Telephone Encounter (Signed)
David Pugh called today with some questions regarding his folic acid and his current medications. Please call him back.

## 2012-03-27 NOTE — Progress Notes (Signed)
Problem #1 this gentleman who is anemic with a history of stage III colon cancer at laboratory work which is revealed a ferritin of 8 and a folic acid of less then 0.4. I talked to him today and will call in folic acid 1 mg twice a day and Niferex 100 mg elixir 5 cc a day and in 4 weeks we will prove whether or not he is absorbing this. Interestingly he is already on 800 mcg of folic acid per day through his B complex vitamin for his eye and Centrum Silver as a regular vitamin.  He started to state on the phone that he is already having some difficulty with his memory but he has a severe peripheral neuropathy.

## 2012-03-27 NOTE — Telephone Encounter (Signed)
Spoke with pt- his folic acid level is low per Dr. Mariel Sleet. Pt wants to know if Dexilant can cause problems absorbing folic acid. Advised pt that I would research it and call him back.

## 2012-03-28 ENCOUNTER — Telehealth (HOSPITAL_COMMUNITY): Payer: Self-pay

## 2012-03-28 NOTE — Telephone Encounter (Signed)
Call from patient.  Wanted David Pugh to know that he found out that 07/17/2008 he had a folic acid level that was > 24.   Also that his pharmacist did some research for him about Folic Acid and was told that new research showed that low folic acid levels was a marker for Colon Cancer.

## 2012-03-28 NOTE — Telephone Encounter (Signed)
Spoke with AS, KJ and Mardelle Matte the drug rep from [employer], they have not heard of any reports like this and Mardelle Matte stated he would call the company and see if there have been any concerns about it. Called pt and informed him

## 2012-03-29 ENCOUNTER — Encounter (HOSPITAL_COMMUNITY): Payer: Medicare Other | Attending: Oncology

## 2012-03-29 ENCOUNTER — Ambulatory Visit (INDEPENDENT_AMBULATORY_CARE_PROVIDER_SITE_OTHER): Payer: Medicare Other | Admitting: General Surgery

## 2012-03-29 ENCOUNTER — Encounter (INDEPENDENT_AMBULATORY_CARE_PROVIDER_SITE_OTHER): Payer: Self-pay | Admitting: General Surgery

## 2012-03-29 VITALS — BP 140/70 | HR 76 | Temp 98.4°F | Resp 16 | Ht 70.0 in | Wt 211.8 lb

## 2012-03-29 DIAGNOSIS — C189 Malignant neoplasm of colon, unspecified: Secondary | ICD-10-CM | POA: Insufficient documentation

## 2012-03-29 DIAGNOSIS — G609 Hereditary and idiopathic neuropathy, unspecified: Secondary | ICD-10-CM | POA: Insufficient documentation

## 2012-03-29 DIAGNOSIS — I10 Essential (primary) hypertension: Secondary | ICD-10-CM | POA: Insufficient documentation

## 2012-03-29 DIAGNOSIS — Z7982 Long term (current) use of aspirin: Secondary | ICD-10-CM | POA: Insufficient documentation

## 2012-03-29 DIAGNOSIS — K589 Irritable bowel syndrome without diarrhea: Secondary | ICD-10-CM | POA: Insufficient documentation

## 2012-03-29 DIAGNOSIS — C182 Malignant neoplasm of ascending colon: Secondary | ICD-10-CM

## 2012-03-29 DIAGNOSIS — F4310 Post-traumatic stress disorder, unspecified: Secondary | ICD-10-CM | POA: Insufficient documentation

## 2012-03-29 DIAGNOSIS — I519 Heart disease, unspecified: Secondary | ICD-10-CM | POA: Insufficient documentation

## 2012-03-29 DIAGNOSIS — Z79899 Other long term (current) drug therapy: Secondary | ICD-10-CM | POA: Insufficient documentation

## 2012-03-29 DIAGNOSIS — L719 Rosacea, unspecified: Secondary | ICD-10-CM | POA: Insufficient documentation

## 2012-03-29 DIAGNOSIS — Z8546 Personal history of malignant neoplasm of prostate: Secondary | ICD-10-CM | POA: Insufficient documentation

## 2012-03-29 NOTE — Patient Instructions (Signed)
You are healing without any obvious complication following your right colon resection.  I agree with Dr. Thornton Papas recommendations for the Xeloda chemotherapy pill.  Continue to take a 1-2 mile walk daily and you will slowly get your strength back. It may take 2 months for you to get all of you strength back.  Be sure to stay well hydrated and eat a healthy diet.  No sports or lifting more than 20 pounds for 6 weeks from the date of surgery.  Return to see Dr. Derrell Lolling in 6 weeks.

## 2012-03-29 NOTE — Progress Notes (Signed)
Subjective:     Patient ID: David Harps., male   DOB: May 26, 1935, 77 y.o.   MRN: [id number]  HPI This 78 year old gentleman underwent laparoscopic-assisted right colectomy on 02/29/2012 for a cancer in the cecum. He has recovered without any surgical complication.  Final pathology shows a moderately differentiated adenocarcinoma, 2.5 cm, positive lymphovascular invasion, 2/20 lymph nodes positive, all margins negative. Pathologic stage T3, N1c.  His strength and appetite are slowly improving but are not back to baseline yet. He has a chronic neuropathy. He has seen Dr. Mancel Bale who advised adjuvant chemotherapy. He was referred to Dr. Glenford Peers in [place] which will be much more convenient for the patient. He has seen Dr. Mariel Sleet who has advised adjuvant Xeloda. I told the patient that I strongly supported Dr. Thornton Papas advise.  Review of Systems     Objective:   Physical Exam Patient looks well. No distress. Wife is with him.  Abdomen soft. Nontender. Incision is well healed. No infection. No hernia.    Assessment:     Moderately differentiated adenocarcinoma of the cecum, 2.5 cm, 2 of 20 lymph nodes positive, negative margins, pathologic stage T3, N1c.  Recovering uneventfully following recent laparoscopic right colectomy    Plan:     Diet and activities discussed. Daily 1-2 mile walk advised to slowly increase strength and stamina.  Agree with adjuvant Xeloda  Return to see me in 6 weeks.   David Pugh, M.D., [facility] General and Minimally invasive Surgery Breast and Colorectal Surgery Office:   [phone] Pager:   [phone]

## 2012-03-30 ENCOUNTER — Other Ambulatory Visit (HOSPITAL_COMMUNITY): Payer: Medicare Other

## 2012-04-05 ENCOUNTER — Encounter (HOSPITAL_COMMUNITY): Payer: Medicare Other | Attending: Oncology

## 2012-04-05 DIAGNOSIS — I959 Hypotension, unspecified: Secondary | ICD-10-CM | POA: Insufficient documentation

## 2012-04-05 DIAGNOSIS — E860 Dehydration: Secondary | ICD-10-CM | POA: Insufficient documentation

## 2012-04-05 DIAGNOSIS — R197 Diarrhea, unspecified: Secondary | ICD-10-CM | POA: Insufficient documentation

## 2012-04-05 DIAGNOSIS — R110 Nausea: Secondary | ICD-10-CM | POA: Insufficient documentation

## 2012-04-05 DIAGNOSIS — D649 Anemia, unspecified: Secondary | ICD-10-CM

## 2012-04-05 DIAGNOSIS — C182 Malignant neoplasm of ascending colon: Secondary | ICD-10-CM | POA: Insufficient documentation

## 2012-04-05 DIAGNOSIS — E876 Hypokalemia: Secondary | ICD-10-CM | POA: Insufficient documentation

## 2012-04-05 DIAGNOSIS — C189 Malignant neoplasm of colon, unspecified: Secondary | ICD-10-CM

## 2012-04-05 DIAGNOSIS — D529 Folate deficiency anemia, unspecified: Secondary | ICD-10-CM | POA: Insufficient documentation

## 2012-04-05 DIAGNOSIS — D509 Iron deficiency anemia, unspecified: Secondary | ICD-10-CM | POA: Insufficient documentation

## 2012-04-05 DIAGNOSIS — R112 Nausea with vomiting, unspecified: Secondary | ICD-10-CM | POA: Insufficient documentation

## 2012-04-05 LAB — CBC WITH DIFFERENTIAL/PLATELET
Basophils Relative: 0 % (ref 0–1)
Hemoglobin: 9 g/dL — ABNORMAL LOW (ref 13.0–17.0)
Lymphocytes Relative: 22 % (ref 12–46)
Lymphs Abs: 2.2 10*3/uL (ref 0.7–4.0)
MCHC: 31.5 g/dL (ref 30.0–36.0)
Monocytes Relative: 7 % (ref 3–12)
Neutro Abs: 6.4 10*3/uL (ref 1.7–7.7)
Neutrophils Relative %: 65 % (ref 43–77)
RBC: 3.73 MIL/uL — ABNORMAL LOW (ref 4.22–5.81)
WBC: 9.8 10*3/uL (ref 4.0–10.5)

## 2012-04-05 NOTE — Progress Notes (Signed)
Labs drawn today for cbc/diff 

## 2012-04-06 ENCOUNTER — Other Ambulatory Visit (HOSPITAL_COMMUNITY): Payer: Medicare Other

## 2012-04-07 ENCOUNTER — Telehealth (HOSPITAL_COMMUNITY): Payer: Self-pay | Admitting: *Deleted

## 2012-04-07 NOTE — Telephone Encounter (Signed)
Pt called with c/o nausea and constipation. He is taking iron pills and xeloda. I instructed him on use of MOM and stool softners and he will start that. I also instructed him to take zofran 30 min before taking xeloda. I advised him to stop the iron for 3 days and call us on Monday to let us know how he is doing.

## 2012-04-07 NOTE — Telephone Encounter (Signed)
Great plan. Thanks

## 2012-04-10 ENCOUNTER — Telehealth (HOSPITAL_COMMUNITY): Payer: Self-pay | Admitting: *Deleted

## 2012-04-10 NOTE — Telephone Encounter (Signed)
Pt instructed to stay off of iron until 8/21 (per [name]) when he sees you because of the pain in his abdomen and nausea associated with taking it. Patient called in on Friday 8/9 and Tammy told him to stop his iron over the weekend and take Zofran before the Xeloda. This worked for patient. Symptoms improved and patient wanted to stay off of the iron. Pt will probably discuss iv iron with you during his appt 8/21

## 2012-04-12 ENCOUNTER — Encounter (HOSPITAL_BASED_OUTPATIENT_CLINIC_OR_DEPARTMENT_OTHER): Payer: Medicare Other

## 2012-04-12 DIAGNOSIS — C189 Malignant neoplasm of colon, unspecified: Secondary | ICD-10-CM

## 2012-04-12 DIAGNOSIS — D649 Anemia, unspecified: Secondary | ICD-10-CM

## 2012-04-12 LAB — CBC WITH DIFFERENTIAL/PLATELET
Eosinophils Absolute: 0.9 10*3/uL — ABNORMAL HIGH (ref 0.0–0.7)
Hemoglobin: 9 g/dL — ABNORMAL LOW (ref 13.0–17.0)
Lymphs Abs: 1.9 10*3/uL (ref 0.7–4.0)
MCH: 23.6 pg — ABNORMAL LOW (ref 26.0–34.0)
Monocytes Relative: 5 % (ref 3–12)
Neutrophils Relative %: 61 % (ref 43–77)
RBC: 3.82 MIL/uL — ABNORMAL LOW (ref 4.22–5.81)

## 2012-04-12 NOTE — Progress Notes (Signed)
Labs drawn today for cbc/diff 

## 2012-04-13 ENCOUNTER — Other Ambulatory Visit (HOSPITAL_COMMUNITY): Payer: Medicare Other

## 2012-04-17 ENCOUNTER — Other Ambulatory Visit (HOSPITAL_COMMUNITY): Payer: Self-pay

## 2012-04-17 ENCOUNTER — Telehealth (HOSPITAL_COMMUNITY): Payer: Self-pay

## 2012-04-17 ENCOUNTER — Encounter (HOSPITAL_COMMUNITY): Payer: Medicare Other

## 2012-04-17 VITALS — BP 132/80 | HR 82 | Temp 98.3°F | Resp 16 | Wt 201.0 lb

## 2012-04-17 DIAGNOSIS — C182 Malignant neoplasm of ascending colon: Secondary | ICD-10-CM

## 2012-04-17 DIAGNOSIS — R197 Diarrhea, unspecified: Secondary | ICD-10-CM

## 2012-04-17 DIAGNOSIS — R110 Nausea: Secondary | ICD-10-CM

## 2012-04-17 LAB — CBC WITH DIFFERENTIAL/PLATELET
Hemoglobin: 9.1 g/dL — ABNORMAL LOW (ref 13.0–17.0)
Lymphocytes Relative: 26 % (ref 12–46)
Lymphs Abs: 2.7 10*3/uL (ref 0.7–4.0)
MCH: 24.3 pg — ABNORMAL LOW (ref 26.0–34.0)
Monocytes Relative: 8 % (ref 3–12)
Neutro Abs: 6.5 10*3/uL (ref 1.7–7.7)
Neutrophils Relative %: 62 % (ref 43–77)
RBC: 3.75 MIL/uL — ABNORMAL LOW (ref 4.22–5.81)
WBC: 10.5 10*3/uL (ref 4.0–10.5)

## 2012-04-17 LAB — COMPREHENSIVE METABOLIC PANEL
ALT: 6 U/L (ref 0–53)
Alkaline Phosphatase: 65 U/L (ref 39–117)
BUN: 14 mg/dL (ref 6–23)
CO2: 24 mEq/L (ref 19–32)
Chloride: 104 mEq/L (ref 96–112)
GFR calc Af Amer: 63 mL/min — ABNORMAL LOW (ref >90)
Glucose, Bld: 100 mg/dL — ABNORMAL HIGH (ref 70–99)
Potassium: 3.8 mEq/L (ref 3.5–5.1)
Sodium: 138 mEq/L (ref 135–145)
Total Bilirubin: 0.7 mg/dL (ref 0.3–1.2)

## 2012-04-17 MED ORDER — LORAZEPAM 1 MG PO TABS: ORAL_TABLET | ORAL | Status: DC

## 2012-04-17 NOTE — Telephone Encounter (Signed)
Call from patient with complaints of diarrhea since Saturday.  States I take the Imodium and the diarrhea will stop for about 6 hours then it restarts and has had 6 BMs in last 24 hours.  Also complains of nausea (no vomiting) that is unrelieved by the Ondansetron also complains of feeling very weak but denies any fevers. Completed Xeloda last Wednesday.

## 2012-04-17 NOTE — Patient Instructions (Addendum)
David Pugh.  DOB 04-Aug-1935 CSN [id number]  MRN [id number] Dr. Glenford Peers   [facility]  Discharge Instructions  RECOMMENDATIONS MADE BY THE CONSULTANT AND ANY TEST RESULTS WILL BE SENT TO YOUR REFERRING DOCTOR.   EXAM FINDINGS BY MD TODAY AND SIGNS AND SYMPTOMS TO REPORT TO CLINIC OR PRIMARY MD: we will decrease your xeloda to 1500 mg twice daily for 14 days.  Do not restart until you are down to at least 2 bowel movements per 24 hours.  Lab work does not show any evidence of dehydration.  MEDICATIONS PRESCRIBED: Xeloda 1500 mg (3 tablets) twice daily for 14 days once diarrhea has stopped. Immodium - take 2 caplets then during the daytime take 1 caplet every 2 hours, at bedtime take 2 caplets at bedtime and every 4 hours until morning.  In the morning if you are still having diarrhea, resume 1 caplet every 2 hours. Stop the immodium when there are no bowel movements for at least 12 hours.  Ativan 1 mg - take 1 tablet every 3 - 4 hours as needed for nausea , the pill can be swallowed or dissolved under your tongue.  You can also take the zofran as you have been taking it.  INSTRUCTIONS GIVEN AND DISCUSSED: Let us know if the nausea and diarrhea does not improve  SPECIAL INSTRUCTIONS/FOLLOW-UP: Return to Clinic: As scheduled.   I acknowledge that I have been informed and understand all the instructions given to me and received a copy. I do not have any more questions at this time, but understand that I may call the Specialty Clinic at [facility] at [phone] during business hours should I have any further questions or need assistance in obtaining follow-up care.    __________________________________________  _____________  __________ Signature of Patient or Authorized Representative            Date                   Time    __________________________________________ Nurse's Signature

## 2012-04-17 NOTE — Progress Notes (Unsigned)
1:20 pm David Pugh. presented for labwork. Labs per MD order drawn via Peripheral Line 20 gauge needle inserted in right AC  Good blood return present. Procedure without incident.  Needle removed intact. Patient tolerated procedure well.

## 2012-04-19 ENCOUNTER — Encounter (HOSPITAL_BASED_OUTPATIENT_CLINIC_OR_DEPARTMENT_OTHER): Payer: Medicare Other | Admitting: Oncology

## 2012-04-19 ENCOUNTER — Encounter (HOSPITAL_COMMUNITY): Payer: Medicare Other

## 2012-04-19 VITALS — BP 128/73 | HR 73 | Temp 98.2°F | Resp 20 | Ht 70.0 in | Wt 202.0 lb

## 2012-04-19 DIAGNOSIS — I959 Hypotension, unspecified: Secondary | ICD-10-CM

## 2012-04-19 DIAGNOSIS — R5381 Other malaise: Secondary | ICD-10-CM

## 2012-04-19 DIAGNOSIS — D649 Anemia, unspecified: Secondary | ICD-10-CM

## 2012-04-19 DIAGNOSIS — R197 Diarrhea, unspecified: Secondary | ICD-10-CM

## 2012-04-19 DIAGNOSIS — C189 Malignant neoplasm of colon, unspecified: Secondary | ICD-10-CM

## 2012-04-19 DIAGNOSIS — D509 Iron deficiency anemia, unspecified: Secondary | ICD-10-CM

## 2012-04-19 DIAGNOSIS — D539 Nutritional anemia, unspecified: Secondary | ICD-10-CM

## 2012-04-19 DIAGNOSIS — E611 Iron deficiency: Secondary | ICD-10-CM

## 2012-04-19 DIAGNOSIS — D529 Folate deficiency anemia, unspecified: Secondary | ICD-10-CM

## 2012-04-19 LAB — BASIC METABOLIC PANEL
BUN: 12 mg/dL (ref 6–23)
Calcium: 8.7 mg/dL (ref 8.4–10.5)
Creatinine, Ser: 1.17 mg/dL (ref 0.50–1.35)
GFR calc Af Amer: 69 mL/min — ABNORMAL LOW (ref >90)

## 2012-04-19 MED ORDER — HEPARIN SOD (PORK) LOCK FLUSH 100 UNIT/ML IV SOLN
INTRAVENOUS | Status: AC
  Filled 2012-04-19: qty 5

## 2012-04-19 MED ORDER — SODIUM CHLORIDE 0.9 % IV SOLN
INTRAVENOUS | Status: DC
  Administered 2012-04-19: 15:00:00 via INTRAVENOUS

## 2012-04-19 MED ORDER — SODIUM CHLORIDE 0.9 % IJ SOLN
INTRAMUSCULAR | Status: AC
  Filled 2012-04-19: qty 10

## 2012-04-19 MED ORDER — SODIUM CHLORIDE 0.9 % IV SOLN
1020.0000 mg | Freq: Once | INTRAVENOUS | Status: AC
  Administered 2012-04-19: 1020 mg via INTRAVENOUS
  Filled 2012-04-19: qty 34

## 2012-04-19 MED ORDER — HEPARIN SOD (PORK) LOCK FLUSH 100 UNIT/ML IV SOLN
500.0000 [IU] | Freq: Once | INTRAVENOUS | Status: DC
  Administered 2012-04-19: 250 [IU] via INTRAVENOUS

## 2012-04-19 NOTE — Progress Notes (Signed)
Patient is here today for followup as a work in. He has had uncontrollable diarrhea now since the second week of capecitabine. I've given him 2000 mg twice a day for 14 days for cycle 1 but he states that starting with day 8 of the first cycle he started having abdominal cramps and some loose stools and since stopping the therapy they had not really improved. He has lost a few pounds he is not febrile no chills but is hypotensive today is also very weak tired unable to eat no appetite etc. his blood pressure is 96/55 pulse is around 70-80. It appears regular. He looks very weak and tired however. Abdomen shows hyperactivity to his bowel sounds no distention no obvious ascites no obvious organomegaly. He has no leg edema but he looks somewhat pale. He was started on oral iron one he was seen but was not able tolerated quit several weeks ago. Since he still has a microcytic anemia I will give him IV iron today hydrate him with saline today and possibly tomorrow stop his hypertensive medications until his systolic blood pressure is at least 120 and a diastolic blood pressure is at least over 80. He is to completely stop any future use of oral iron. I suspect he does not metabolize capecitabine well. He is having 6-12 stools a day some of which he cannot control. We will try to get stool for C. difficile and routine pathogens. He is to eat only cooked bland foods.. I want to see him before start cycle 2 I suspect that we'll have to reduce his dose from 2000 mg to 1000 mg twice a day.

## 2012-04-19 NOTE — Progress Notes (Signed)
Discharged home via wheelchair.  States " I feel better, don't feel as weak".  Plans to return in the am for additional IV fluids.

## 2012-04-19 NOTE — Patient Instructions (Addendum)
[facility]  Discharge Instructions David Pugh.  DOB 1934/10/27 CSN [id number]  MRN [id number] Dr. Glenford Peers   RECOMMENDATIONS MADE BY THE CONSULTANT AND ANY TEST RESULTS WILL BE SENT TO YOUR REFERRING DOCTOR.  Fluids and IV iron today along with labs for ferritin, ITIBC, and folate.  EXAM FINDINGS BY MD TODAY AND SIGNS AND SYMPTOMS TO REPORT TO CLINIC OR PRIMARY MD: We need to hold the xeloda for now and we will reduce the dose with next cycle.  MEDICATIONS PRESCRIBED:  No xeloda this week Use the ativan for nausea but take only 1/2 tablet every 6 hrs. You don't have to take the zofran. NO BLOOD PRESSURE MEDS....METOPROLOL(LOPRESSOR) OR COZAAR(LOSARTAN) UNTIL TOP NUMBER IS ABOVE 120 AND BOTTOM NUMBER ABOVE 80 AND YOU ARE FEELING MUCH STRONGER.  INSTRUCTIONS GIVEN AND DISCUSSED: Try applesauce, white rice.  No fresh fruits or vegetables  SPECIAL INSTRUCTIONS/FOLLOW-UP: Come tomorrow for fluids if you are not stronger. You have an appt at 1015 am.    I acknowledge that I have been informed and understand all the instructions given to me and received a copy. I do not have any more questions at this time, but understand that I may call the Specialty Clinic at [facility] at [phone] during business hours should I have any further questions or need assistance in obtaining follow-up care.    __________________________________________  _____________  __________ Signature of Patient or Authorized Representative            Date                   Time    __________________________________________ Nurse's Signature

## 2012-04-19 NOTE — Addendum Note (Signed)
Addended by: Evelena Leyden on: 04/19/2012 06:27 PM   Modules accepted: Orders

## 2012-04-20 ENCOUNTER — Encounter (HOSPITAL_BASED_OUTPATIENT_CLINIC_OR_DEPARTMENT_OTHER): Payer: Medicare Other

## 2012-04-20 ENCOUNTER — Other Ambulatory Visit (HOSPITAL_COMMUNITY): Payer: Self-pay | Admitting: Oncology

## 2012-04-20 VITALS — BP 117/73 | HR 77 | Temp 97.7°F | Resp 20

## 2012-04-20 DIAGNOSIS — E876 Hypokalemia: Secondary | ICD-10-CM

## 2012-04-20 DIAGNOSIS — C182 Malignant neoplasm of ascending colon: Secondary | ICD-10-CM

## 2012-04-20 DIAGNOSIS — C180 Malignant neoplasm of cecum: Secondary | ICD-10-CM

## 2012-04-20 DIAGNOSIS — R197 Diarrhea, unspecified: Secondary | ICD-10-CM

## 2012-04-20 LAB — FOLATE: Folate: >20 ng/mL

## 2012-04-20 MED ORDER — POTASSIUM CHLORIDE ER 10 MEQ PO TBCR: 10.0000 meq | EXTENDED_RELEASE_TABLET | Freq: Every day | ORAL | Status: DC

## 2012-04-20 MED ORDER — SODIUM CHLORIDE 0.9 % IV SOLN
Freq: Once | INTRAVENOUS | Status: AC
  Administered 2012-04-20: 11:00:00 via INTRAVENOUS
  Filled 2012-04-20: qty 1000

## 2012-04-20 MED ORDER — SODIUM CHLORIDE 0.9 % IJ SOLN
10.0000 mL | Freq: Once | INTRAMUSCULAR | Status: DC
  Filled 2012-04-20: qty 10

## 2012-04-20 NOTE — Progress Notes (Signed)
David Pugh. tolerated infusion well and without incident; verbalizes understanding for follow-up.  No distress noted at time of discharge and patient was discharged home with his wife.

## 2012-04-21 ENCOUNTER — Telehealth (HOSPITAL_COMMUNITY): Payer: Self-pay

## 2012-04-21 NOTE — Telephone Encounter (Signed)
Spoke with patient and he is feeling better.  Was able to eat some solid food and diarrhea has decreased.

## 2012-04-23 LAB — STOOL CULTURE

## 2012-04-24 ENCOUNTER — Other Ambulatory Visit (HOSPITAL_COMMUNITY): Payer: Self-pay

## 2012-04-24 DIAGNOSIS — R197 Diarrhea, unspecified: Secondary | ICD-10-CM

## 2012-04-25 ENCOUNTER — Encounter (HOSPITAL_BASED_OUTPATIENT_CLINIC_OR_DEPARTMENT_OTHER): Payer: Medicare Other

## 2012-04-25 ENCOUNTER — Encounter (HOSPITAL_COMMUNITY): Payer: Self-pay | Admitting: Oncology

## 2012-04-25 ENCOUNTER — Encounter (HOSPITAL_BASED_OUTPATIENT_CLINIC_OR_DEPARTMENT_OTHER): Payer: Medicare Other | Admitting: Oncology

## 2012-04-25 VITALS — BP 115/75 | HR 80 | Temp 98.6°F | Resp 18 | Wt 195.0 lb

## 2012-04-25 DIAGNOSIS — D649 Anemia, unspecified: Secondary | ICD-10-CM

## 2012-04-25 DIAGNOSIS — E860 Dehydration: Secondary | ICD-10-CM

## 2012-04-25 DIAGNOSIS — R197 Diarrhea, unspecified: Secondary | ICD-10-CM

## 2012-04-25 DIAGNOSIS — R5381 Other malaise: Secondary | ICD-10-CM

## 2012-04-25 DIAGNOSIS — C189 Malignant neoplasm of colon, unspecified: Secondary | ICD-10-CM

## 2012-04-25 DIAGNOSIS — R112 Nausea with vomiting, unspecified: Secondary | ICD-10-CM

## 2012-04-25 DIAGNOSIS — E876 Hypokalemia: Secondary | ICD-10-CM

## 2012-04-25 LAB — CBC WITH DIFFERENTIAL/PLATELET
Basophils Absolute: 0.1 10*3/uL (ref 0.0–0.1)
Eosinophils Absolute: 0.5 10*3/uL (ref 0.0–0.7)
Eosinophils Relative: 6 % — ABNORMAL HIGH (ref 0–5)
Lymphocytes Relative: 28 % (ref 12–46)
MCV: 79.5 fL (ref 78.0–100.0)
Platelets: 382 10*3/uL (ref 150–400)
RDW: 25 % — ABNORMAL HIGH (ref 11.5–15.5)
WBC: 8.2 10*3/uL (ref 4.0–10.5)

## 2012-04-25 LAB — BASIC METABOLIC PANEL
CO2: 23 mEq/L (ref 19–32)
Calcium: 9.1 mg/dL (ref 8.4–10.5)
GFR calc non Af Amer: 64 mL/min — ABNORMAL LOW (ref >90)
Sodium: 141 mEq/L (ref 135–145)

## 2012-04-25 LAB — CEA: CEA: 1.1 ng/mL (ref 0.0–5.0)

## 2012-04-25 MED ORDER — CHOLESTYRAMINE 4 G PO PACK: PACK | ORAL | Status: DC

## 2012-04-25 MED ORDER — SODIUM CHLORIDE 0.9 % IJ SOLN
INTRAMUSCULAR | Status: AC
  Filled 2012-04-25: qty 10

## 2012-04-25 MED ORDER — LORAZEPAM 2 MG/ML IJ SOLN
0.5000 mg | Freq: Once | INTRAMUSCULAR | Status: AC
  Administered 2012-04-25: 0.5 mg via INTRAVENOUS

## 2012-04-25 MED ORDER — LORAZEPAM 2 MG/ML IJ SOLN
INTRAMUSCULAR | Status: AC
  Filled 2012-04-25: qty 1

## 2012-04-25 MED ORDER — SODIUM CHLORIDE 0.9 % IV SOLN
INTRAVENOUS | Status: DC
  Administered 2012-04-25: 1500 mL via INTRAVENOUS

## 2012-04-25 MED ORDER — SODIUM CHLORIDE 0.9 % IV SOLN
16.0000 mg | Freq: Once | INTRAVENOUS | Status: AC
  Administered 2012-04-25: 16 mg via INTRAVENOUS
  Filled 2012-04-25: qty 8

## 2012-04-25 MED ORDER — POTASSIUM CHLORIDE 2 MEQ/ML IV SOLN
Freq: Once | INTRAVENOUS | Status: AC
  Administered 2012-04-25: 12:00:00 via INTRAVENOUS
  Filled 2012-04-25: qty 1000

## 2012-04-25 NOTE — Progress Notes (Signed)
Tolerated fluids and antiemetics well. Stated that he feels much better this afternoon.

## 2012-04-25 NOTE — Progress Notes (Signed)
The patient returns today still having 4-6 stools per day though more closely to the number 4 than 6. His weight is down 7 more pounds in 6 days. He still has no appetite. He drank only 3 cups of liquid 4 ounces each yesterday. He has nausea and has vomited twice in the last 6 days. He of course stop the iron pill due to upset stomach. We gave him IV iron when we discovered his iron studies were low and she could not tolerate the oral iron-we gave him that last week. His color is better but his blood pressure is still low. He is still off his blood pressure medications which he needs to stay off until his blood pressure is 130/80 or higher. He also needs to be taking in fluids and food better. He is also on a cholesterol medication which is on hold as well and that needs to be on hold still. His vital signs show a clear drop in his blood pressure from sitting to standing and a rise in his pulse from 80 to 100 from sitting to standing. His wife is very concerned about him. He does look somewhat better though today and his color is good better. His blood work is pending. I want to give him fluids today since he is dehydrated clinically and I have discussed his case with his gastroenterologist, Dr. Kendell Bane. He recommends giving him Questran one half pack a day to begin what to see if this will help with his loose stools. The patient will continue sublingual lorazepam and we will try to continue with fluids. When he was extremely weak 1 mg of lorazepam made him confused and nearly passed out. So we have reduced his dose to 0.5 mg. His stool cultures last week for C. difficile and routine pathogens was also negative. Dr. Kendell Bane will be in touch with him this evening.  The patient is considering cessation of all therapy. If we cannot get him feeling better I do not think we can blame him for discontinuing therapy. He is concerned that he might be " a quitter" but I have tried to reassure him that this is most likely   toxicity from capecitabine. I will see him in a week.

## 2012-04-25 NOTE — Patient Instructions (Addendum)
David Pugh.  DOB 11/09/1934 CSN [id number]  MRN [id number] Dr. Glenford Peers   [facility]  Discharge Instructions  RECOMMENDATIONS MADE BY THE CONSULTANT AND ANY TEST RESULTS WILL BE SENT TO YOUR REFERRING DOCTOR.   EXAM FINDINGS BY MD TODAY AND SIGNS AND SYMPTOMS TO REPORT TO CLINIC OR PRIMARY MD: need to give you some antiemetics and IV fluids today.  We will also check some blood work today.  It's ok for you to stop chemotherapy.  You have not been able to tolerate it.  Dr. Mariel Sleet will talk with Dr. Jena Gauss.  We will keep a check on your CEA every 12 weeks.  MEDICATIONS PRESCRIBED: none   INSTRUCTIONS GIVEN AND DISCUSSED: Other : Report continued diarrhea, nausea and vomiting.  SPECIAL INSTRUCTIONS/FOLLOW-UP: Return to Clinic in 1 week to see PA.   I acknowledge that I have been informed and understand all the instructions given to me and received a copy. I do not have any more questions at this time, but understand that I may call the Specialty Clinic at [facility] at [phone] during business hours should I have any further questions or need assistance in obtaining follow-up care.    __________________________________________  _____________  __________ Signature of Patient or Authorized Representative            Date                   Time    __________________________________________ Nurse's Signature

## 2012-04-26 ENCOUNTER — Other Ambulatory Visit (HOSPITAL_COMMUNITY): Payer: Self-pay

## 2012-04-26 DIAGNOSIS — E876 Hypokalemia: Secondary | ICD-10-CM

## 2012-05-02 ENCOUNTER — Encounter (HOSPITAL_COMMUNITY): Payer: Medicare Other | Attending: Oncology | Admitting: Oncology

## 2012-05-02 ENCOUNTER — Encounter (HOSPITAL_COMMUNITY): Payer: Self-pay | Admitting: Oncology

## 2012-05-02 VITALS — BP 119/74 | HR 71 | Temp 98.1°F | Resp 18 | Wt 201.9 lb

## 2012-05-02 DIAGNOSIS — C182 Malignant neoplasm of ascending colon: Secondary | ICD-10-CM

## 2012-05-02 DIAGNOSIS — E611 Iron deficiency: Secondary | ICD-10-CM | POA: Insufficient documentation

## 2012-05-02 DIAGNOSIS — C779 Secondary and unspecified malignant neoplasm of lymph node, unspecified: Secondary | ICD-10-CM

## 2012-05-02 DIAGNOSIS — D509 Iron deficiency anemia, unspecified: Secondary | ICD-10-CM

## 2012-05-02 DIAGNOSIS — E876 Hypokalemia: Secondary | ICD-10-CM

## 2012-05-02 DIAGNOSIS — C180 Malignant neoplasm of cecum: Secondary | ICD-10-CM

## 2012-05-02 LAB — BASIC METABOLIC PANEL
BUN: 11 mg/dL (ref 6–23)
CO2: 26 mEq/L (ref 19–32)
Calcium: 8.9 mg/dL (ref 8.4–10.5)
Creatinine, Ser: 1.08 mg/dL (ref 0.50–1.35)
Glucose, Bld: 82 mg/dL (ref 70–99)

## 2012-05-02 NOTE — Progress Notes (Signed)
David Boston, MD [address], [place] [address]  1. Low blood potassium  Basic metabolic panel  2. Cancer of ascending colon      CURRENT THERAPY: Observation  INTERVAL HISTORY: David Pugh. 77 y.o. male returns for  regular  visit for followup of Stage III colon cancer, S/P resection on 02/29/2012 by Dr. Claud Kelp.  AND Iron deficiency anemia, S/P Feraheme 1020 mg on 04/19/2012.  David Pugh is accompanied by his wife.  He is doing much better.  His Bowels are back to baseline.  He denies any blood in his stool, black tarry stool, change in color or caliber, and pain with BMs.   Lab work from today is pending.  I personally reviewed and went over laboratory results with the patient.  We discussed his lab work from last week.    He wondered how long it will take for the chemotherapy to leave his body.  I reported that Capecitabine has a 1/2 life of 45 minutes and therefore by this time all of his chemotherapy is gone.  We are now seeing the residual symptoms of treatment including affects of his taste buds.  This should improve over the next few months.   He also wanted to know when his immune system would recover and I explained that his WBC count was normal last week with a sufficient ANC.  Therefore his immune system is back to baseline.   He is not interested in trying Capecitabine at a reduced dose.  He is not interested in giving it another shot and that is reasonable.   He denies any complaints and ROS questioning is negative.   Past Medical History  Diagnosis Date  . Premature ventricular contraction   . Prostate cancer 2010  . 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 Radiation Feb 2011    treatment  . Biliary dyskinesia   . GERD (gastroesophageal reflux disease)   . Hiatal hernia 2010    tiny  . HTN (hypertension)   . Rosacea   . PTSD (post-traumatic stress disorder)   . Angina pectoris, unstable   . Diverticula of colon 2010  . Colon polyps    infammatory  . Arthritis   . Exposure to [employer]   . CAD (coronary artery disease)     hx of 2 stents   . Peripheral neuropathy   . Anemia     hx of anemia as a child   . Colon cancer     has PREMATURE VENTRICULAR CONTRACTIONS; GERD; Positive occult stool blood test; and Cancer of ascending colon on his problem list.     is allergic to shellfish allergy and sulfonamide derivatives.  David Pugh does not currently have medications on file.  Past Surgical History  Procedure Date  . Cholecystectomy   . Prostatectomy   . Tonsillectomy   . Transurethral resection of prostate   . Esophagogastroduodenoscopy 10/10/2008    Dr. Maceo Pro hiatal hernia, normal esophagus, normal stomach  . Colonoscopy 02/12/2004    Dr. Jena Gauss- L side diverticula, inflammatory  colon polyps  . Esophagogastroduodenoscopy 02/12/2004    Dr. Geni Bers- normal   . Carotid stent 02/14/11  . Cardiac catheterization 02/13/2011    [facility], [facility] cardiology  . Coronary stent placement 01/2011  . Colonoscopy 01/19/2012    Procedure: COLONOSCOPY;  Surgeon: Corbin Ade, MD;  Location: [facility];  Service: Endoscopy;  Laterality: N/A;  12:00  . Knee surgery  left knee   . Partial colectomy 02/29/2012    Procedure: PARTIAL COLECTOMY;  Surgeon: Ernestene Mention, MD;  Location: [facility] ORS;  Service: General;  Laterality: Right;  . Vasectomy     Denies any headaches, dizziness, double vision, fevers, chills, night sweats, nausea, vomiting, diarrhea, constipation, chest pain, heart palpitations, shortness of breath, blood in stool, black tarry stool, urinary pain, urinary burning, urinary frequency, hematuria.   PHYSICAL EXAMINATION  ECOG PERFORMANCE STATUS: 1 - Symptomatic but completely ambulatory  Filed Vitals:   05/02/12 1052  BP: 119/74  Pulse: 71  Temp: 98.1 F (36.7 C)  Resp: 18    GENERAL:alert, no distress, well nourished, well developed, comfortable, cooperative and smiling SKIN: skin color,  texture, turgor are normal, no rashes or significant lesions HEAD: Normocephalic, No masses, lesions, tenderness or abnormalities EYES: normal, Conjunctiva are pink and non-injected EARS: External ears normal OROPHARYNX:lips, buccal mucosa, and tongue normal and mucous membranes are moist  NECK: supple, no adenopathy, thyroid normal size, non-tender, without nodularity, no stridor, non-tender, trachea midline LYMPH:  no palpable lymphadenopathy, no hepatosplenomegaly BREAST:not examined LUNGS: clear to auscultation and percussion HEART: regular rate & rhythm, no murmurs, no gallops, S1 normal and S2 normal ABDOMEN:abdomen soft, non-tender, normal bowel sounds, no masses or organomegaly and no hepatosplenomegaly BACK: Back symmetric, no curvature., No CVA tenderness EXTREMITIES:less then 2 second capillary refill, no joint deformities, effusion, or inflammation, no edema, no skin discoloration, no clubbing, no cyanosis  NEURO: alert & oriented x 3 with fluent speech, no focal motor/sensory deficits, gait normal    LABORATORY DATA: CBC    Component Value Date/Time   WBC 8.2 04/25/2012 0905   RBC 3.95* 04/25/2012 0905   HGB 10.1* 04/25/2012 0905   HCT 31.4* 04/25/2012 0905   PLT 382 04/25/2012 0905   MCV 79.5 04/25/2012 0905   MCH 25.6* 04/25/2012 0905   MCHC 32.2 04/25/2012 0905   RDW 25.0* 04/25/2012 0905   LYMPHSABS 2.3 04/25/2012 0905   MONOABS 1.0 04/25/2012 0905   EOSABS 0.5 04/25/2012 0905   BASOSABS 0.1 04/25/2012 0905      Chemistry      Component Value Date/Time   NA 141 04/25/2012 0905   K 3.0* 04/25/2012 0905   CL 107 04/25/2012 0905   CO2 23 04/25/2012 0905   BUN 8 04/25/2012 0905   CREATININE 1.09 04/25/2012 0905      Component Value Date/Time   CALCIUM 9.1 04/25/2012 0905   ALKPHOS 65 04/17/2012 1330   AST 15 04/17/2012 1330   ALT 6 04/17/2012 1330   BILITOT 0.7 04/17/2012 1330        PENDING LABS: BMET   PATHOLOGY: 02/29/2012  Diagnosis Colon, segmental resection  for tumor, right and omentum - INVASIVE MODERATELY DIFFERENTIATED ADENOCARCINOMA, INVADING THROUGH THE MUSCULARIS PROPRIA INTO PERICOLONIC FATTY TISSUE. - ANGIOLYMPHATIC INVASION PRESENT. - TUMOR DEPOSIT PRESENT. - TWO OF TWENTY LYMPH NODES, POSITIVE FOR METASTATIC CARCINOMA (2/20) - RESECTION MARGINS, NEGATIVE FOR ATYPIA OR MALIGNANCY. - PLEASE SEE ONCOLOGY TEMPLATE FOR DETAIL. - APPENDIX: FIBROUS OBLITERANS, NO EVIDENCE OF NEOPLASM. Microscopic Comment COLON AND RECTUM Specimen: Right colon and omentum. Procedure: Segmental resection. Tumor site: Cecum. Specimen integrity: Intact. Macroscopic intactness of mesorectum: N/A Macroscopic tumor perforation: No. Invasive tumor: Maximum size: 2.5 cm, gross measurement. Histologic type(s): Invasive adenocarcinoma. Histologic grade and differentiation: G2: moderately differentiated/low grade Type of polyp in which invasive carcinoma arose: N/A Microscopic extension of invasive tumor: Invading through the muscularis propria into pericolonic fatty tissue. Lymph-Vascular invasion: Present.  Peri-neural invasion: Not identified. Tumor deposit(s) (discontinuous extramural extension): Present. Resection margins: Proximal margin: 6 cm Distal margin: 17 cm 1 of 3 Duplicate copy FINAL for BIRNEY, BELSHE. [phone]) Microscopic Comment(continued) Mesenteric margin (sigmoid and transverse): 10 cm Treatment effect (neoadjuvant therapy): N/A Number of lymph nodes examined - 20 ; Number positive - 2 Additional polyp(s): N/A Non-neoplastic findings: N/A Pathologic Staging: pT3, pN1c, pMx Ancillary studies: will be performed upon request from cancer center. Abigail Miyamoto MD Pathologist, Electronic Signature (Case signed 03/03/2012)     ASSESSMENT:  1. Stage III B Colon Cancer, S/P right colectomy by Dr. Derrell Lolling on 02/29/2012.  He was tried on oral Capecitabine and this was discontinued due to severe diarrhea and dehydration.  2. Iron  Deficiency, S/P Feraheme 1020 mg IV on 04/19/2012.  PLAN:  1. I personally reviewed and went over laboratory results with the patient. 2. Will await lab work from today.  Will call with directions if needed. 3. Will D/C Capecitabine at the patient's request.  He has declined a dose reduction.  4. Patient education provided regarding his immune system recovery and the excretion of the chemotherapy drug.  5. Lab work in 3 months: CBC diff, Iron/TIBC, Ferritin, CEA.  6. Will perform CEAs every 3 months.  7. Patient will be due for surveillance CT of Abd/pelivis in July 2014. 8. Return in 3 months for follow-up.  If doing well, will see every 6 months for follow-up.  Patient education rpovided regarding NCCN guideline.    All questions were answered. The patient knows to call the clinic with any problems, questions or concerns. We can certainly see the patient much sooner if necessary.  The patient and plan discussed with Glenford Peers, MD and he is in agreement with the aforementioned.  Tashika Goodin

## 2012-05-02 NOTE — Patient Instructions (Addendum)
David Pugh.  DOB 1934/09/28 CSN [id number]  MRN [id number] Dr. Glenford Pugh   [facility]  Discharge Instructions  RECOMMENDATIONS MADE BY THE CONSULTANT AND ANY TEST RESULTS WILL BE SENT TO YOUR REFERRING DOCTOR.   EXAM FINDINGS BY MD TODAY AND SIGNS AND SYMPTOMS TO REPORT TO CLINIC OR PRIMARY MD: Exam and discussion by PA.  You are doing well.  If there are any problems we will call you.  We will check your iron stores in November and see you after your blood work in November.  MEDICATIONS PRESCRIBED: none   INSTRUCTIONS GIVEN AND DISCUSSED: Other :  Report changes in bowel habits, blood in stool, shortness of breath,etc.  SPECIAL INSTRUCTIONS/FOLLOW-UP: Lab work Needed today and in November and Return to Clinic after labs in November.   I acknowledge that I have been informed and understand all the instructions given to me and received a copy. I do not have any more questions at this time, but understand that I may call the Specialty Clinic at [facility] at [phone] during business hours should I have any further questions or need assistance in obtaining follow-up care.    __________________________________________  _____________  __________ Signature of Patient or Authorized Representative            Date                   Time    __________________________________________ Nurse's Signature

## 2012-05-09 ENCOUNTER — Encounter (INDEPENDENT_AMBULATORY_CARE_PROVIDER_SITE_OTHER): Payer: Self-pay | Admitting: General Surgery

## 2012-05-09 ENCOUNTER — Other Ambulatory Visit: Payer: Self-pay

## 2012-05-09 ENCOUNTER — Ambulatory Visit (INDEPENDENT_AMBULATORY_CARE_PROVIDER_SITE_OTHER): Payer: Medicare Other | Admitting: General Surgery

## 2012-05-09 VITALS — BP 140/86 | HR 80 | Temp 97.6°F | Resp 18 | Ht 70.0 in | Wt 205.0 lb

## 2012-05-09 DIAGNOSIS — C182 Malignant neoplasm of ascending colon: Secondary | ICD-10-CM

## 2012-05-09 MED ORDER — DEXLANSOPRAZOLE 60 MG PO CPDR: 60.0000 mg | DELAYED_RELEASE_CAPSULE | Freq: Every day | ORAL | Status: DC

## 2012-05-09 NOTE — Patient Instructions (Addendum)
Your examination today is good. All of your wounds have healed well and you have gained your weight back.  He may resume normal physical activities without restriction.  Dr. Mariel Sleet is going to check lab work every 3 months.  You should have a repeat CT scan in one year.  You should have a repeat colonoscopy in July 2014.  Return to see Dr. Derrell Lolling in August 2014.

## 2012-05-09 NOTE — Progress Notes (Signed)
Patient ID: David Harps., male   DOB: 1935-04-21, 77 y.o.   MRN: [id number]  History: This gentleman underwent laparoscopic-assisted right colectomy on 02/29/2012. He recovered uneventfully. Final pathology report showed adenocarcinoma, pathologic stage T3, N1c,    2.5 cm tumor,    2/20 lymph nodes positive for metastatic disease. He was started on Xeloda and  followed closely by Dr. Mariel Sleet but had to stop the medication because of diarrhea, anorexia, and 20 pound weight loss. Since stopping the medication he has gained back 20 pounds to 205 and he feels much better. His appetite is picked up. He is having 2 stools per day there are reasonably well formed. He hasn't had any pain. Dr. Glenford Peers plans lab work every 3 months and CT scan in one year for surveillance.  Exam: Patient looks well. No distress. His wife is with him. Weight 205 pounds. Blood pressure 140/86. Pulse 80. Temp 97.6. Abdomen soft. Nontender. All wounds well healed. No tenderness. No hernia.  Assessment: Adenocarcinoma of the right colon, pathologic stage T3, N1c.    Relatively uneventful recovery 2 months following laparoscopic right colectomy. Intolerance to Xeloda chemotherapy  Plan: Dr. Mariel Sleet will obtain lab work including CEA every 3 months. Advised to get CT scan abdomen and pelvis with contrast in July, 2014 Advised to get colonoscopy with Dr. Jena Gauss  in July, 2014 Return to see me in August 2014.    Angelia Mould. Derrell Lolling, M.D., [facility] General and Minimally invasive Surgery Breast and Colorectal Surgery Office:   [phone] Pager:   [phone]

## 2012-07-10 ENCOUNTER — Encounter: Payer: Self-pay | Admitting: *Deleted

## 2012-07-18 ENCOUNTER — Encounter (HOSPITAL_COMMUNITY): Payer: Medicare Other | Attending: Oncology

## 2012-07-18 DIAGNOSIS — E876 Hypokalemia: Secondary | ICD-10-CM | POA: Insufficient documentation

## 2012-07-18 DIAGNOSIS — E538 Deficiency of other specified B group vitamins: Secondary | ICD-10-CM | POA: Insufficient documentation

## 2012-07-18 DIAGNOSIS — E611 Iron deficiency: Secondary | ICD-10-CM

## 2012-07-18 DIAGNOSIS — C182 Malignant neoplasm of ascending colon: Secondary | ICD-10-CM

## 2012-07-18 DIAGNOSIS — D649 Anemia, unspecified: Secondary | ICD-10-CM

## 2012-07-18 DIAGNOSIS — C189 Malignant neoplasm of colon, unspecified: Secondary | ICD-10-CM

## 2012-07-18 DIAGNOSIS — D509 Iron deficiency anemia, unspecified: Secondary | ICD-10-CM | POA: Insufficient documentation

## 2012-07-18 LAB — CBC WITH DIFFERENTIAL/PLATELET
Eosinophils Relative: 12 % — ABNORMAL HIGH (ref 0–5)
HCT: 38.3 % — ABNORMAL LOW (ref 39.0–52.0)
Lymphocytes Relative: 26 % (ref 12–46)
Lymphs Abs: 2.8 10*3/uL (ref 0.7–4.0)
MCV: 87.4 fL (ref 78.0–100.0)
Monocytes Absolute: 1 10*3/uL (ref 0.1–1.0)
RBC: 4.38 MIL/uL (ref 4.22–5.81)
WBC: 10.9 10*3/uL — ABNORMAL HIGH (ref 4.0–10.5)

## 2012-07-18 LAB — FERRITIN: Ferritin: 8 ng/mL — ABNORMAL LOW (ref 22–322)

## 2012-07-18 LAB — IRON AND TIBC
Saturation Ratios: 12 % — ABNORMAL LOW (ref 20–55)
UIBC: 330 ug/dL (ref 125–400)

## 2012-07-18 NOTE — Progress Notes (Signed)
Labs drawn today for cea,cbc/diff,Iron and IBC,ferr

## 2012-07-19 ENCOUNTER — Encounter (HOSPITAL_BASED_OUTPATIENT_CLINIC_OR_DEPARTMENT_OTHER): Payer: Medicare Other | Admitting: Oncology

## 2012-07-19 ENCOUNTER — Encounter: Payer: Self-pay | Admitting: Internal Medicine

## 2012-07-19 ENCOUNTER — Encounter (HOSPITAL_COMMUNITY): Payer: Self-pay | Admitting: Oncology

## 2012-07-19 VITALS — BP 146/71 | HR 66 | Temp 98.0°F | Resp 18 | Wt 223.6 lb

## 2012-07-19 DIAGNOSIS — C182 Malignant neoplasm of ascending colon: Secondary | ICD-10-CM

## 2012-07-19 DIAGNOSIS — E876 Hypokalemia: Secondary | ICD-10-CM

## 2012-07-19 DIAGNOSIS — E611 Iron deficiency: Secondary | ICD-10-CM

## 2012-07-19 DIAGNOSIS — D509 Iron deficiency anemia, unspecified: Secondary | ICD-10-CM

## 2012-07-19 DIAGNOSIS — E538 Deficiency of other specified B group vitamins: Secondary | ICD-10-CM

## 2012-07-19 NOTE — Progress Notes (Signed)
Problem #1 stage III cancer the colon status post surgical resection with him tolerance to capecitabine which had to be discontinued. Problem #2 extensive peripheral neuropathy, grade 2, at least. Problem #3 iron deficiency anemia at presentation already status post 1 feraheme infusion and his ferritin is still low at this time. I suspect he consumed all of his iron utilizing it for his red cell production which is excellent presently. Problem #4 severe folic acid deficiency at presentation here with normal levels documented after oral folic acid replacement. He will change that is 1 pill or 2 pills a week and we will check his levels in may. He eats some greens but not and extensive amount. Problem #5 hypokalemia He looks great today. He is unfortunately in 20 pounds since September. He states his appetite is excellent. His wife is concerned because his red meat daily so we discussed a lifestyle change for him. It may not change his risk of recurrence for this present cancer but it may reduce his risk of new cancers going forward in the years ahead. His cancer marker the other day was excellent but his iron as mentioned above is still very low and we will replenish and once again tomorrow. He cannot tolerate oral iron due to severe upset stomach with nausea.  Bowels are working 2-3 times a day basically after each meal. He has no abdominal pain. He has no nausea. No vomiting. Fevers chills night sweats etc. Vital signs are stable other than his weight which is rising. Lymph node exam is negative throughout. His lungs are clear. His heart shows a regular rhythm and rate without murmur rub or gallop. Abdomen is soft nontender without hepatosplenomegaly. He has no leg edema. Skin exam is unremarkable except for benign changes. He is on an antibiotic for an oral tooth issue which hopefully will resolve that. We will continue CEAs every 3 months, I will check his potassium in 3 months since it was low as well in  the past. In May I will check his iron stores and folic acid and at that time set him up for CT scans of his abdomen and pelvis as part of his surveillance since he is at higher risk than other people for recurrent disease. He would like to do the scans as well. He will let me know in the interim if there are any other issues.

## 2012-07-19 NOTE — Patient Instructions (Addendum)
[facility]  Discharge Instructions  RECOMMENDATIONS MADE BY THE CONSULTANT AND ANY TEST RESULTS WILL BE SENT TO YOUR REFERRING DOCTOR.   EXAM FINDINGS BY MD TODAY AND SIGNS AND SYMPTOMS TO REPORT TO CLINIC OR PRIMARY MD: exam and discussion by MD.  Bonita Quin are doing well.  Take your folic acid once per week.  You can stop your potassium for now and we will check your potassium level in February.  Try to limit your red meat intake.  MEDICATIONS PRESCRIBED: none   INSTRUCTIONS GIVEN AND DISCUSSED: Other :  Report changes in bowel habits or patterns, blood in you stool, increased shortness of breath, increased fatigue, unexplained weight loss, etc.  SPECIAL INSTRUCTIONS/FOLLOW-UP: Lab work Needed in February and in May and Return to Clinic in May after lab work. Results for ROWYN, AMSTUTZ (MRN [id number]) as of 07/19/2012 10:17  Ref. Range 07/18/2012 10:08  Iron Latest Range: 42-135 ug/dL 46  UIBC Latest Range: 125-400 ug/dL 272  TIBC Latest Range: 215-435 ug/dL 536  Saturation Ratios Latest Range: 20-55 % 12 (L)  Ferritin Latest Range: 22-322 ng/mL 8 (L)  WBC Latest Range: 4.0-10.5 K/uL 10.9 (H)  RBC Latest Range: 4.22-5.81 MIL/uL 4.38  Hemoglobin Latest Range: 13.0-17.0 g/dL 64.4 (L)  HCT Latest Range: 39.0-52.0 % 38.3 (L)  MCV Latest Range: 78.0-100.0 fL 87.4  MCH Latest Range: 26.0-34.0 pg 28.5  MCHC Latest Range: 30.0-36.0 g/dL 03.4  RDW Latest Range: 11.5-15.5 % 14.3  Platelets Latest Range: 150-400 K/uL 340  Neutrophils Relative Latest Range: 43-77 % 53  Lymphocytes Relative Latest Range: 12-46 % 26  Monocytes Relative Latest Range: 3-12 % 9  Eosinophils Relative Latest Range: 0-5 % 12 (H)  Basophils Relative Latest Range: 0-1 % 0  NEUT# Latest Range: 1.7-7.7 K/uL 5.8  Lymphocytes Absolute Latest Range: 0.7-4.0 K/uL 2.8  Monocytes Absolute Latest Range: 0.1-1.0 K/uL 1.0  Eosinophils Absolute Latest Range: 0.0-0.7 K/uL 1.3 (H)  Basophils  Absolute Latest Range: 0.0-0.1 K/uL 0.0  WBC Morphology No range found ATYPICAL LYMPHOCYTES  CEA Latest Range: 0.0-5.0 ng/mL 1.0     I acknowledge that I have been informed and understand all the instructions given to me and received a copy. I do not have any more questions at this time, but understand that I may call the Specialty Clinic at [facility] at [phone] during business hours should I have any further questions or need assistance in obtaining follow-up care.    __________________________________________  _____________  __________ Signature of Patient or Authorized Representative            Date                   Time    __________________________________________ Nurse's Signature

## 2012-07-20 ENCOUNTER — Encounter (HOSPITAL_BASED_OUTPATIENT_CLINIC_OR_DEPARTMENT_OTHER): Payer: Medicare Other

## 2012-07-20 VITALS — BP 132/78 | HR 64 | Temp 98.2°F | Resp 18

## 2012-07-20 DIAGNOSIS — E611 Iron deficiency: Secondary | ICD-10-CM

## 2012-07-20 DIAGNOSIS — D509 Iron deficiency anemia, unspecified: Secondary | ICD-10-CM

## 2012-07-20 MED ORDER — SODIUM CHLORIDE 0.9 % IJ SOLN
10.0000 mL | INTRAMUSCULAR | Status: DC | PRN
  Administered 2012-07-20: 10 mL
  Filled 2012-07-20: qty 10

## 2012-07-20 MED ORDER — SODIUM CHLORIDE 0.9 % IJ SOLN
INTRAMUSCULAR | Status: AC
  Filled 2012-07-20: qty 10

## 2012-07-20 MED ORDER — SODIUM CHLORIDE 0.9 % IV SOLN
1020.0000 mg | Freq: Once | INTRAVENOUS | Status: AC
  Administered 2012-07-20: 1020 mg via INTRAVENOUS
  Filled 2012-07-20: qty 34

## 2012-07-20 MED ORDER — SODIUM CHLORIDE 0.9 % IV SOLN
Freq: Once | INTRAVENOUS | Status: AC
  Administered 2012-07-20: 14:00:00 via INTRAVENOUS

## 2012-07-20 NOTE — Progress Notes (Signed)
Tolerated well

## 2012-07-25 ENCOUNTER — Encounter: Payer: Self-pay | Admitting: Internal Medicine

## 2012-07-26 ENCOUNTER — Telehealth: Payer: Self-pay | Admitting: General Practice

## 2012-07-26 ENCOUNTER — Ambulatory Visit (INDEPENDENT_AMBULATORY_CARE_PROVIDER_SITE_OTHER): Payer: Medicare Other | Admitting: Gastroenterology

## 2012-07-26 ENCOUNTER — Encounter: Payer: Self-pay | Admitting: Gastroenterology

## 2012-07-26 VITALS — BP 137/81 | HR 69 | Temp 97.4°F | Ht 70.0 in | Wt 223.8 lb

## 2012-07-26 DIAGNOSIS — Z86010 Personal history of colonic polyps: Secondary | ICD-10-CM

## 2012-07-26 DIAGNOSIS — Z860101 Personal history of adenomatous and serrated colon polyps: Secondary | ICD-10-CM | POA: Insufficient documentation

## 2012-07-26 DIAGNOSIS — D509 Iron deficiency anemia, unspecified: Secondary | ICD-10-CM

## 2012-07-26 DIAGNOSIS — E538 Deficiency of other specified B group vitamins: Secondary | ICD-10-CM

## 2012-07-26 DIAGNOSIS — E611 Iron deficiency: Secondary | ICD-10-CM

## 2012-07-26 DIAGNOSIS — C182 Malignant neoplasm of ascending colon: Secondary | ICD-10-CM

## 2012-07-26 NOTE — Telephone Encounter (Signed)
Message copied by Jennings Books on Wed Jul 26, 2012  2:56 PM ------      Message from: Tiffany Kocher      Created: Wed Jul 26, 2012  2:00 PM       FYI. RMR approved to triage this one as it will be out of date from OV.            ----- Message -----         From: Corbin Ade, MD         Sent: 07/26/2012   1:55 PM           To: Tiffany Kocher, PA            Update triage will be satisfactory.      ----- Message -----         From: Tiffany Kocher, PA         Sent: 07/26/2012   1:27 PM           To: Corbin Ade, MD, Tiffany Kocher, PA            Patient scheduled his six month f/u TCS today. Scheduled for 08/28/12 but will be past 30 day ov by 3 days. He requested this due to family in town around holidays, etc.            Will you please go along with this and not make it come back in for OV? I got the impression that he is personal friend of yours.

## 2012-07-26 NOTE — Assessment & Plan Note (Signed)
Patient is s/p right colectomy for Stage III adenocarcinoma of the colon. Did not tolerate chemo. He had descending colon polyp with focal high grade dysplasia removed as well. He is due for his six month f/u colonoscopy in 07/2012.  I have discussed the risks, alternatives, benefits with regards to but not limited to the risk of reaction to medication, bleeding, infection, perforation and the patient is agreeable to proceed. Written consent to be obtained.

## 2012-07-26 NOTE — Progress Notes (Signed)
Primary Care Physician:  Louie Boston, MD  Primary Gastroenterologist:  Roetta Sessions, MD   Chief Complaint  Patient presents with  . Colonoscopy    HPI:  David Pugh. is a 77 y.o. male here to schedule his six month f/u surveillance colonoscopy. He was diagnosed with colon cancer at time of colonoscopy in 12/2011. Lesion was in base of cecum. He also had fragment of tubular adenoma with focal high grade dysplasia in the descending colon. Prep was adequate. He also had changes of radiation-induced proctitis but given he was asymptomatic no treatment was offered.   Patient underwent laparoscopic assisted partial colectomy (right colectomy) 02/29/12 by Dr. Claud Kelp. He was found to be Stage III. He was unable to tolerate capecitabine and this was discontinued. He reports he is feeling well. Gain 20 pounds since surgery. Has been receiving iron infusions. Folic acid was <0.4 but has corrected with oral supplements. Patient gives h/o chronic IDA since childhood. Chronic diarrhea, was told he had IBS when in the service and has just learned to live with it.  Denies melena, brbpr, vomiting, dysphagia. GERD controlled. Worried he cannot take the Moviprep again. Terrible gag reflex with liquids.    Current Outpatient Prescriptions  Medication Sig Dispense Refill  . ALPRAZolam (XANAX) 0.25 MG tablet Take 1 tablet (0.25 mg total) by mouth at bedtime as needed.  30 tablet  5  . amoxicillin (AMOXIL) 500 MG capsule Take 500 mg by mouth 2 (two) times daily.       Marland Kitchen aspirin EC 81 MG tablet Take 81 mg by mouth at bedtime.       . B Complex-Folic Acid CAPS Take 1 capsule by mouth every morning.       . citalopram (CELEXA) 10 MG tablet Take 10 mg by mouth every morning.       Marland Kitchen dexlansoprazole (DEXILANT) 60 MG capsule Take 1 capsule (60 mg total) by mouth daily before breakfast.  30 capsule  11  . folic acid (FOLVITE) 1 MG tablet Take 1 Bid for 30 days then 1 per day  100 tablet  prn  .  latanoprost (XALATAN) 0.005 % ophthalmic solution Place 1 drop into both eyes at bedtime.       Marland Kitchen losartan (COZAAR) 50 MG tablet Take 50 mg by mouth every morning.       . metoprolol (LOPRESSOR) 50 MG tablet Take 50 mg by mouth 2 (two) times daily.       . nitroGLYCERIN (NITROSTAT) 0.4 MG SL tablet Place 0.4 mg under the tongue every 5 (five) minutes x 3 doses as needed. For chest pain      . pravastatin (PRAVACHOL) 40 MG tablet Take 40 mg by mouth daily. Pt takes at bedtime        Allergies as of 07/26/2012 - Review Complete 07/26/2012  Allergen Reaction Noted  . Shellfish allergy Anaphylaxis 01/13/2012  . Sulfonamide derivatives Diarrhea 04/30/2009    Past Medical History  Diagnosis Date  . Premature ventricular contraction   . Prostate cancer 2010  . 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 Radiation Feb 2011    treatment  . Biliary dyskinesia   . GERD (gastroesophageal reflux disease)   . Hiatal hernia 2010    tiny  . HTN (hypertension)   . Rosacea   . PTSD (post-traumatic stress disorder)   . Angina pectoris, unstable   . Diverticula of colon 2010  . Colon polyps  tubular adenomas  . Arthritis   . Exposure to [employer]   . CAD (coronary artery disease)     hx of 2 stents   . Peripheral neuropathy   . Anemia     hx of anemia as a child   . Colon cancer 12/2011    s/p right hemicolectomy, did not tolerate chemo  . Iron deficiency 05/02/2012    followed by Dr. Mariel Sleet, iron infusions.   . Folic acid deficiency     Past Surgical History  Procedure Date  . Cholecystectomy   . Prostatectomy   . Tonsillectomy   . Transurethral resection of prostate   . Esophagogastroduodenoscopy 10/10/2008    Dr. Maceo Pro hiatal hernia, normal esophagus, normal stomach  . Colonoscopy 02/12/2004    Dr. Jena Gauss- L side diverticula, inflammatory  colon polyps  . Esophagogastroduodenoscopy 02/12/2004    Dr. Geni Bers- normal   . Carotid stent 02/14/11  . Cardiac catheterization 02/13/2011     [facility], [facility] cardiology  . Coronary stent placement 01/2011  . Colonoscopy 01/19/2012    RMR: Prominent changes involving the rectal mucosa consistent with radiation-induced proctitis. Multiple colonic  polyps removed as described above. Sigmoid Diverticulosis/ 1.5 x 2 cm relatively flat ulcerated lesion in cecum/path showed adenocarcinoma of the colon. descending colon polyp with tubular adenoma and one fragment of focal high grade dysplasia.   . Knee surgery     left knee   . Partial colectomy 02/29/2012    Procedure: PARTIAL COLECTOMY;  Surgeon: Ernestene Mention, MD;  Location: [facility];  Service: General;  Laterality: Right;  . Vasectomy     Family History  Problem Relation Age of Onset  . Throat cancer Father   . Cancer Father     Throat  . Heart attack    . Prostate cancer Brother   . Cancer Brother     Prostate and Skin  . Colon cancer Neg Hx     History   Social History  . Marital Status: Married    Spouse Name: N/A    Number of Children: N/A  . Years of Education: N/A   Occupational History  . Retired    Social History Main Topics  . Smoking status: Never Smoker   . Smokeless tobacco: Never Used  . Alcohol Use: Yes     Comment: 1 cocktail/week  . Drug Use: No  . Sexually Active: Yes    Birth Control/ Protection: None   Other Topics Concern  . Not on file   Social History Narrative  . No narrative on file      ROS:  General: Negative for anorexia, weight loss, fever, chills, fatigue, weakness. Eyes: Negative for vision changes.  ENT: Negative for hoarseness, difficulty swallowing , nasal congestion. CV: Negative for chest pain, angina, palpitations, dyspnea on exertion, peripheral edema.  Respiratory: Negative for dyspnea at rest, dyspnea on exertion, cough, sputum, wheezing.  GI: See history of present illness. GU:  Negative for dysuria, hematuria, urinary incontinence, urinary frequency, nocturnal urination.  MS: Negative for joint pain, low  back pain. Chronic peripheral neuropathy. Derm: Negative for rash or itching.  Neuro: Negative for weakness, abnormal sensation, seizure, frequent headaches, memory loss, confusion.  Psych: Negative for anxiety, depression, suicidal ideation, hallucinations.  Endo: Negative for unusual weight change.  Heme: Negative for bruising or bleeding. Allergy: Negative for rash or hives.    Physical Examination:  BP 137/81  Pulse 69  Temp 97.4 F (36.3 C) (Temporal)  Ht 5\' 10"  (  1.778 m)  Wt 223 lb 12.8 oz (101.515 kg)  BMI 32.11 kg/m2   General: Well-nourished, well-developed in no acute distress. Accompanied by wife.  Head: Normocephalic, atraumatic.   Eyes: Conjunctiva pink, no icterus. Mouth: Oropharyngeal mucosa moist and pink , no lesions erythema or exudate. Neck: Supple without thyromegaly, masses, or lymphadenopathy.  Lungs: Clear to auscultation bilaterally.  Heart: Regular rate and rhythm, no murmurs rubs or gallops.  Abdomen: Bowel sounds are normal, nontender, nondistended, no hepatosplenomegaly or masses, no abdominal bruits or    hernia , no rebound or guarding.  Well-healed midline incision. Rectal: not performed Extremities: No lower extremity edema. No clubbing or deformities.  Neuro: Alert and oriented x 4 , grossly normal neurologically.  Skin: Warm and dry, no rash or jaundice.   Psych: Alert and cooperative, normal mood and affect.  Labs: Lab Results  Component Value Date   CEA 1.0 07/18/2012   Lab Results  Component Value Date   CREATININE 1.08 05/02/2012   BUN 11 05/02/2012   NA 139 05/02/2012   K 4.6 05/02/2012   CL 108 05/02/2012   CO2 26 05/02/2012   Lab Results  Component Value Date   ALT 6 04/17/2012   AST 15 04/17/2012   ALKPHOS 65 04/17/2012   BILITOT 0.7 04/17/2012   Lab Results  Component Value Date   WBC 10.9* 07/18/2012   HGB 12.5* 07/18/2012   HCT 38.3* 07/18/2012   MCV 87.4 07/18/2012   PLT 340 07/18/2012   Lab Results  Component Value Date    IRON 46 07/18/2012   TIBC 376 07/18/2012   FERRITIN 8* 07/18/2012    Imaging Studies: No results found.

## 2012-07-26 NOTE — Assessment & Plan Note (Signed)
Chronic anemia, chronic diarrhea with IDA and folic acid deficiency. Requiring iron infusions. For completeness, will screen for celiac disease with TTG, IgA level.

## 2012-07-26 NOTE — Progress Notes (Signed)
Faxed to PCP

## 2012-07-26 NOTE — Patient Instructions (Addendum)
Please have your blood work done at your convenience but at least several days prior to your colonoscopy. We have scheduled you for a colonoscopy with Dr. Jena Gauss. Please see separate instructions.

## 2012-08-03 NOTE — Progress Notes (Signed)
Quick Note:  Celiac labs negative. Colonoscopy as planned. ______

## 2012-08-07 ENCOUNTER — Encounter (HOSPITAL_COMMUNITY): Payer: Self-pay | Admitting: Pharmacy Technician

## 2012-08-07 ENCOUNTER — Telehealth: Payer: Self-pay

## 2012-08-07 NOTE — Telephone Encounter (Signed)
David Pugh called back, returning your call.

## 2012-08-07 NOTE — Telephone Encounter (Signed)
Gastroenterology Pre-Procedure Form   RMR approved for triage per Tana Coast, PA  Request Date: 08/28/2012      Requesting Physician: Jena Gauss      PATIENT INFORMATION:  David Pugh. is a 76 y.o., male (DOB=08/07/1935).  PROCEDURE: Procedure(s) requested: colonoscopy Procedure Reason: previous colon cancer  PATIENT REVIEW QUESTIONS: The patient reports the following:   1. Diabetes Melitis: no 2. Joint replacements in the past 12 months: no 3. Major health problems in the past 3 months: no 4. Has an artificial valve or MVP:no 5. Has been advised in past to take antibiotics in advance of a procedure like teeth cleaning: no}    MEDICATIONS & ALLERGIES:    Patient reports the following regarding taking any blood thinners:   Plavix? no Aspirin?yes  Coumadin?  no  Patient confirms/reports the following medications:  Current Outpatient Prescriptions  Medication Sig Dispense Refill  . acetaminophen (TYLENOL) 650 MG CR tablet Take 1,300 mg by mouth 2 (two) times daily.      Marland Kitchen ALPRAZolam (XANAX) 0.25 MG tablet Take 0.25 mg by mouth at bedtime as needed. Anxiety.      Marland Kitchen aspirin EC 81 MG tablet Take 81 mg by mouth at bedtime.       . B Complex-Folic Acid CAPS Take 1 capsule by mouth every morning.       . citalopram (CELEXA) 10 MG tablet Take 10 mg by mouth every morning.       . folic acid (FOLVITE) 1 MG tablet Take 1 mg by mouth once a week.      . latanoprost (XALATAN) 0.005 % ophthalmic solution Place 1 drop into both eyes at bedtime.       Marland Kitchen losartan (COZAAR) 50 MG tablet Take 50 mg by mouth every morning.       . metoprolol (LOPRESSOR) 50 MG tablet Take 50 mg by mouth 2 (two) times daily.       . Multiple Vitamin (MULTIVITAMIN WITH MINERALS) TABS Take 1 tablet by mouth daily.      . nitroGLYCERIN (NITROSTAT) 0.4 MG SL tablet Place 0.4 mg under the tongue every 5 (five) minutes x 3 doses as needed. For chest pain      . omeprazole (PRILOSEC) 20 MG capsule Take 20 mg by  mouth daily.      . pravastatin (PRAVACHOL) 40 MG tablet Take 40 mg by mouth at bedtime. Pt takes at bedtime        Patient confirms/reports the following allergies:  Allergies  Allergen Reactions  . Shellfish Allergy Anaphylaxis  . Sulfonamide Derivatives Diarrhea    Patient is appropriate to schedule for requested procedure(s): yes  AUTHORIZATION INFORMATION Primary Insurance:  ID #:  Group #:  Pre-Cert / Auth required:  Pre-Cert / Auth #:   Secondary Insurance:   ID #:   Group #:  Pre-Cert / Auth required:   No orders of the defined types were placed in this encounter.    SCHEDULE INFORMATION: Procedure has been scheduled as follows:  Date: 08/28/2012    Time: 10:30 AM  Location: [facility] Short Stay  This Gastroenterology Pre-Precedure Form is being routed to the following provider(s) for review: Tana Coast, PA   Pt has his prescription and instructions

## 2012-08-07 NOTE — Telephone Encounter (Signed)
LMOM to call.

## 2012-08-08 NOTE — Telephone Encounter (Signed)
OK to schedule as is.

## 2012-08-28 ENCOUNTER — Encounter (HOSPITAL_COMMUNITY)
Admission: RE | Disposition: A | Discharge status: Home / Self Care | Payer: Self-pay | Source: Ambulatory Visit | Attending: Internal Medicine

## 2012-08-28 ENCOUNTER — Encounter (HOSPITAL_COMMUNITY): Payer: Self-pay | Admitting: *Deleted

## 2012-08-28 ENCOUNTER — Ambulatory Visit (HOSPITAL_COMMUNITY)
Admission: RE | Admit: 2012-08-28 | Discharge: 2012-08-28 | Disposition: A | Discharge status: Home / Self Care | Payer: Medicare Other | Source: Ambulatory Visit | Attending: Internal Medicine | Admitting: Internal Medicine

## 2012-08-28 DIAGNOSIS — C182 Malignant neoplasm of ascending colon: Secondary | ICD-10-CM

## 2012-08-28 DIAGNOSIS — D126 Benign neoplasm of colon, unspecified: Secondary | ICD-10-CM | POA: Insufficient documentation

## 2012-08-28 DIAGNOSIS — K633 Ulcer of intestine: Secondary | ICD-10-CM

## 2012-08-28 DIAGNOSIS — K5730 Diverticulosis of large intestine without perforation or abscess without bleeding: Secondary | ICD-10-CM | POA: Insufficient documentation

## 2012-08-28 DIAGNOSIS — Z860100 Personal history of colon polyps, unspecified: Secondary | ICD-10-CM | POA: Insufficient documentation

## 2012-08-28 DIAGNOSIS — D509 Iron deficiency anemia, unspecified: Secondary | ICD-10-CM | POA: Insufficient documentation

## 2012-08-28 DIAGNOSIS — E611 Iron deficiency: Secondary | ICD-10-CM

## 2012-08-28 DIAGNOSIS — Z85038 Personal history of other malignant neoplasm of large intestine: Secondary | ICD-10-CM | POA: Insufficient documentation

## 2012-08-28 DIAGNOSIS — I10 Essential (primary) hypertension: Secondary | ICD-10-CM | POA: Insufficient documentation

## 2012-08-28 DIAGNOSIS — Z9049 Acquired absence of other specified parts of digestive tract: Secondary | ICD-10-CM | POA: Insufficient documentation

## 2012-08-28 DIAGNOSIS — Z09 Encounter for follow-up examination after completed treatment for conditions other than malignant neoplasm: Secondary | ICD-10-CM | POA: Insufficient documentation

## 2012-08-28 DIAGNOSIS — E538 Deficiency of other specified B group vitamins: Secondary | ICD-10-CM

## 2012-08-28 DIAGNOSIS — K6289 Other specified diseases of anus and rectum: Secondary | ICD-10-CM

## 2012-08-28 HISTORY — PX: COLONOSCOPY: SHX5424

## 2012-08-28 SURGERY — COLONOSCOPY
Anesthesia: Moderate Sedation

## 2012-08-28 MED ORDER — MIDAZOLAM HCL 5 MG/5ML IJ SOLN
INTRAMUSCULAR | Status: DC | PRN
  Administered 2012-08-28 (×2): 2 mg via INTRAVENOUS

## 2012-08-28 MED ORDER — MIDAZOLAM HCL 5 MG/5ML IJ SOLN
INTRAMUSCULAR | Status: AC
  Filled 2012-08-28: qty 10

## 2012-08-28 MED ORDER — MEPERIDINE HCL 100 MG/ML IJ SOLN
INTRAMUSCULAR | Status: AC
  Filled 2012-08-28: qty 1

## 2012-08-28 MED ORDER — STERILE WATER FOR IRRIGATION IR SOLN
Status: DC | PRN
  Administered 2012-08-28: 10:00:00

## 2012-08-28 MED ORDER — SODIUM CHLORIDE 0.45 % IV SOLN
INTRAVENOUS | Status: DC
  Administered 2012-08-28: 10:00:00 via INTRAVENOUS

## 2012-08-28 MED ORDER — MEPERIDINE HCL 100 MG/ML IJ SOLN
INTRAMUSCULAR | Status: DC | PRN
  Administered 2012-08-28: 50 mg via INTRAVENOUS
  Administered 2012-08-28: 25 mg via INTRAVENOUS

## 2012-08-28 NOTE — H&P (Signed)
HPI: David Pugh. is a 77 y.o. male here to schedule his six month f/u surveillance colonoscopy. He was diagnosed with colon cancer at time of colonoscopy in 12/2011. Lesion was in base of cecum. He also had fragment of tubular adenoma with focal high grade dysplasia in the descending colon. Prep was adequate. He also had changes of radiation-induced proctitis but given he was asymptomatic no treatment was offered.  Patient underwent laparoscopic assisted partial colectomy (right colectomy) 02/29/12 by Dr. Claud Kelp. He was found to be Stage III. He was unable to tolerate capecitabine and this was discontinued. He reports he is feeling well. Gain 20 pounds since surgery. Has been receiving iron infusions. Folic acid was <0.4 but has corrected with oral supplements. Patient gives h/o chronic IDA since childhood. Chronic diarrhea, was told he had IBS when in the service and has just learned to live with it. Denies melena, brbpr, vomiting, dysphagia. GERD controlled. Worried he cannot take the Moviprep again. Terrible gag reflex with liquids.    Current Outpatient Prescriptions   Medication  Sig  Dispense  Refill   .  ALPRAZolam (XANAX) 0.25 MG tablet  Take 1 tablet (0.25 mg total) by mouth at bedtime as needed.  30 tablet  5   .  amoxicillin (AMOXIL) 500 MG capsule  Take 500 mg by mouth 2 (two) times daily.     Marland Kitchen  aspirin EC 81 MG tablet  Take 81 mg by mouth at bedtime.     .  B Complex-Folic Acid CAPS  Take 1 capsule by mouth every morning.     .  citalopram (CELEXA) 10 MG tablet  Take 10 mg by mouth every morning.     Marland Kitchen  dexlansoprazole (DEXILANT) 60 MG capsule  Take 1 capsule (60 mg total) by mouth daily before breakfast.  30 capsule  11   .  folic acid (FOLVITE) 1 MG tablet  Take 1 Bid for 30 days then 1 per day  100 tablet  prn   .  latanoprost (XALATAN) 0.005 % ophthalmic solution  Place 1 drop into both eyes at bedtime.     Marland Kitchen  losartan (COZAAR) 50 MG tablet  Take 50 mg by mouth every  morning.     .  metoprolol (LOPRESSOR) 50 MG tablet  Take 50 mg by mouth 2 (two) times daily.     .  nitroGLYCERIN (NITROSTAT) 0.4 MG SL tablet  Place 0.4 mg under the tongue every 5 (five) minutes x 3 doses as needed. For chest pain     .  pravastatin (PRAVACHOL) 40 MG tablet  Take 40 mg by mouth daily. Pt takes at bedtime      Allergies as of 07/26/2012 - Review Complete 07/26/2012   Allergen  Reaction  Noted   .  Shellfish allergy  Anaphylaxis  01/13/2012   .  Sulfonamide derivatives  Diarrhea  04/30/2009    Past Medical History   Diagnosis  Date   .  Premature ventricular contraction    .  Prostate cancer  2010   .  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  Radiation  Feb 2011     treatment   .  Biliary dyskinesia    .  GERD (gastroesophageal reflux disease)    .  Hiatal hernia  2010     tiny   .  HTN (hypertension)    .  Rosacea    .  PTSD (  post-traumatic stress disorder)    .  Angina pectoris, unstable    .  Diverticula of colon  2010   .  Colon polyps      tubular adenomas   .  Arthritis    .  Exposure to [employer]    .  CAD (coronary artery disease)      hx of 2 stents   .  Peripheral neuropathy    .  Anemia      hx of anemia as a child   .  Colon cancer  12/2011     s/p right hemicolectomy, did not tolerate chemo   .  Iron deficiency  05/02/2012     followed by Dr. Mariel Sleet, iron infusions.   .  Folic acid deficiency     Past Surgical History   Procedure  Date   .  Cholecystectomy    .  Prostatectomy    .  Tonsillectomy    .  Transurethral resection of prostate    .  Esophagogastroduodenoscopy  10/10/2008     Dr. Maceo Pro hiatal hernia, normal esophagus, normal stomach   .  Colonoscopy  02/12/2004     Dr. Jena Gauss- L side diverticula, inflammatory colon polyps   .  Esophagogastroduodenoscopy  02/12/2004     Dr. Geni Bers- normal   .  Carotid stent  02/14/11   .  Cardiac catheterization  02/13/2011     [facility] cardiology   .  Coronary stent placement   01/2011   .  Colonoscopy  01/19/2012     RMR: Prominent changes involving the rectal mucosa consistent with radiation-induced proctitis. Multiple colonic polyps removed as described above. Sigmoid Diverticulosis/ 1.5 x 2 cm relatively flat ulcerated lesion in cecum/path showed adenocarcinoma of the colon. descending colon polyp with tubular adenoma and one fragment of focal high grade dysplasia.   .  Knee surgery      left knee   .  Partial colectomy  02/29/2012     Procedure: PARTIAL COLECTOMY; Surgeon: Ernestene Mention, MD; Location: [facility]; Service: General; Laterality: Right;   .  Vasectomy     Family History   Problem  Relation  Age of Onset   .  Throat cancer  Father    .  Cancer  Father       Throat    .  Heart attack     .  Prostate cancer  Brother    .  Cancer  Brother       Prostate and Skin    .  Colon cancer  Neg Hx     History    Social History   .  Marital Status:  Married     Spouse Name:  N/A     Number of Children:  N/A   .  Years of Education:  N/A    Occupational History   .  Retired     Social History Main Topics   .  Smoking status:  Never Smoker   .  Smokeless tobacco:  Never Used   .  Alcohol Use:  Yes      Comment: 1 cocktail/week   .  Drug Use:  No   .  Sexually Active:  Yes     Birth Control/ Protection:  None    Other Topics  Concern   .  Not on file    Social History Narrative   .  No narrative on file    ROS:  General: Negative  for anorexia, weight loss, fever, chills, fatigue, weakness.  Eyes: Negative for vision changes.  ENT: Negative for hoarseness, difficulty swallowing , nasal congestion.  CV: Negative for chest pain, angina, palpitations, dyspnea on exertion, peripheral edema.  Respiratory: Negative for dyspnea at rest, dyspnea on exertion, cough, sputum, wheezing.  GI: See history of present illness.  GU: Negative for dysuria, hematuria, urinary incontinence, urinary frequency, nocturnal urination.  MS: Negative for joint  pain, low back pain. Chronic peripheral neuropathy.  Derm: Negative for rash or itching.  Neuro: Negative for weakness, abnormal sensation, seizure, frequent headaches, memory loss, confusion.  Psych: Negative for anxiety, depression, suicidal ideation, hallucinations.  Endo: Negative for unusual weight change.  Heme: Negative for bruising or bleeding.  Allergy: Negative for rash or hives.  Physical Examination:   General: Well-nourished, well-developed in no acute distress. Accompanied by wife.  Head: Normocephalic, atraumatic.  Eyes: Conjunctiva pink, no icterus.  Mouth: Oropharyngeal mucosa moist and pink , no lesions erythema or exudate.  Neck: Supple without thyromegaly, masses, or lymphadenopathy.  Lungs: Clear to auscultation bilaterally.  Heart: Regular rate and rhythm, no murmurs rubs or gallops.  Abdomen: Bowel sounds are normal, nontender, nondistended, no hepatosplenomegaly or masses, no abdominal bruits or hernia , no rebound or guarding. Well-healed midline incision.  Rectal: not performed  Extremities: No lower extremity edema. No clubbing or deformities.  Neuro: Alert and oriented x 4 , grossly normal neurologically.  Skin: Warm and dry, no rash or jaundice.  Psych: Alert and cooperative, normal mood and affect.  Labs:  Lab Results   Component  Value  Date    CEA  1.0  07/18/2012    Lab Results   Component  Value  Date    CREATININE  1.08  05/02/2012    BUN  11  05/02/2012    NA  139  05/02/2012    K  4.6  05/02/2012    CL  108  05/02/2012    CO2  26  05/02/2012    Lab Results   Component  Value  Date    ALT  6  04/17/2012    AST  15  04/17/2012    ALKPHOS  65  04/17/2012    BILITOT  0.7  04/17/2012    Lab Results   Component  Value  Date    WBC  10.9*  07/18/2012    HGB  12.5*  07/18/2012    HCT  38.3*  07/18/2012    MCV  87.4  07/18/2012    PLT  340  07/18/2012    Lab Results   Component  Value  Date    IRON  46  07/18/2012    TIBC  376  07/18/2012    FERRITIN   8*  07/18/2012   ______  Impression/Recommendations:   Patient is s/p right colectomy for Stage III adenocarcinoma of the colon. Did not tolerate chemo. He had descending colon polyp with focal high grade dysplasia removed as well. He is due for his six month f/u colonoscopy now. I have discussed the risks, alternatives, benefits with regards to but not limited to the risk of reaction to medication, bleeding, infection, perforation and the patient is agreeable to proceed. Written consent to be obtained.   Celiac screen negative

## 2012-08-28 NOTE — Op Note (Signed)
[facility] [address] [place], [address]   COLONOSCOPY PROCEDURE REPORT  PATIENT: David Pugh, David Pugh  MR#:         [id number] BIRTHDATE: 03/24/1935 , 77  yrs. old GENDER: Male ENDOSCOPIST: R.  Roetta Sessions, MD FACP FACG REFERRED BY:  Wyvonnia Lora, M.D.  Claud Kelp, M.D.  Glenford Peers, M.D. PROCEDURE DATE:  08/28/2012 PROCEDURE:     ileo-colonoscopy with biopsy  INDICATIONS: History of right hemicolectomy for colorectal cancer; high grade dysplasia in a polyp removed from his descending colon previously  INFORMED CONSENT:  The risks, benefits, alternatives and imponderables including but not limited to bleeding, perforation as well as the possibility of a missed lesion have been reviewed.  The potential for biopsy, lesion removal, etc. have also been discussed.  Questions have been answered.  All parties agreeable. Please see the history and physical in the medical record for more information.  MEDICATIONS: Versed 5 mg IV and Demerol 75 mg IV in divided basis.  DESCRIPTION OF PROCEDURE:  After a digital rectal exam was performed, the EC-[id number] ([id number])  colonoscope was advanced from the anus through the rectum and colon to the area of the cecum, ileocecal valve and appendiceal orifice.  The cecum was deeply intubated.  These structures were well-seen and photographed for the record.  From the level of the cecum and ileocecal valve, the scope was slowly and cautiously withdrawn.  The mucosal surfaces were carefully surveyed utilizing scope tip deflection to facilitate fold flattening as needed.  The scope was pulled down into the rectum where a thorough examination including retroflexion was performed.    FINDINGS:  Adequate preparation. Neovascular changes in the distal rectum consistent with prior history of radiation therapy to the prostate. Otherwise, the remainder of the rectal mucosa appeared normal.   Left-sided diverticula; Small area  of mucosal irregularity in the mid descending segment adjacent to the mouth of a diverticulum (it is not known whether or not this area corresponds to the area where the polyp containing high-grade dysplasia was resected previously or not).  At the surgical anastomosis there were multiple areas of linear ulceration on the rim of the anastomosis.  I photographed the largest ulcer (see image 11 above). This ulcer was good 1.5 cm in length and about 3 mm wide. It was a definite crater. Otherwise, the distal 5 cm of neoterminal ileum appeared normal  THERAPEUTIC / DIAGNOSTIC MANEUVERS PERFORMED:  The largest area of anastomotic ulceration was biopsied for histologic study. Subsequently, the  subtle area of abnormality in the descending segment (see image 12 above) was also biopsied. The rectum was not biopsied.  COMPLICATIONS: None  CECAL WITHDRAWAL TIME:  not applicable  IMPRESSION:  Radiation proctitis. Colonic diverticulosis. Ulcerations at the surgical anastomosis  - status post biopsy. Subtle abnormality in the descending segment - status post biopsy. I did not see any evidence of overt recurrent neoplasia. Collectively, the above-mentioned lesions could be a major contributing cause of iron deficiency anemia in a setting of daily aspirin use. The patient denies taking any other form of nonsteroidal agent.  RECOMMENDATIONS:   Followup on pathology. Further recommendations to follow.   _______________________________ eSigned:  R. Roetta Sessions, MD FACP [facility] 08/28/2012 11:15 AM   CC:    PATIENT NAME:  David Pugh, David Pugh MR#: [id number]

## 2012-08-30 DIAGNOSIS — K3184 Gastroparesis: Secondary | ICD-10-CM

## 2012-08-30 DIAGNOSIS — R002 Palpitations: Secondary | ICD-10-CM

## 2012-08-30 HISTORY — DX: Gastroparesis: K31.84

## 2012-08-30 HISTORY — DX: Palpitations: R00.2

## 2012-09-01 ENCOUNTER — Encounter (HOSPITAL_COMMUNITY): Payer: Self-pay | Admitting: Internal Medicine

## 2012-09-01 ENCOUNTER — Encounter: Payer: Self-pay | Admitting: Internal Medicine

## 2012-09-04 ENCOUNTER — Encounter: Payer: Self-pay | Admitting: *Deleted

## 2012-09-13 ENCOUNTER — Encounter: Payer: Self-pay | Admitting: Cardiovascular Disease

## 2012-10-10 ENCOUNTER — Encounter (HOSPITAL_COMMUNITY): Payer: Medicare Other | Attending: Oncology

## 2012-10-10 DIAGNOSIS — D649 Anemia, unspecified: Secondary | ICD-10-CM | POA: Insufficient documentation

## 2012-10-10 DIAGNOSIS — C182 Malignant neoplasm of ascending colon: Secondary | ICD-10-CM | POA: Insufficient documentation

## 2012-10-10 DIAGNOSIS — E876 Hypokalemia: Secondary | ICD-10-CM

## 2012-10-10 DIAGNOSIS — C189 Malignant neoplasm of colon, unspecified: Secondary | ICD-10-CM

## 2012-10-10 LAB — COMPREHENSIVE METABOLIC PANEL
ALT: 19 U/L (ref 0–53)
Alkaline Phosphatase: 62 U/L (ref 39–117)
BUN: 18 mg/dL (ref 6–23)
CO2: 28 mEq/L (ref 19–32)
Calcium: 9.7 mg/dL (ref 8.4–10.5)
GFR calc Af Amer: 63 mL/min — ABNORMAL LOW (ref >90)
GFR calc non Af Amer: 54 mL/min — ABNORMAL LOW (ref >90)
Glucose, Bld: 98 mg/dL (ref 70–99)
Sodium: 143 mEq/L (ref 135–145)

## 2012-10-10 NOTE — Progress Notes (Signed)
Labs drawn today for cea,cmp 

## 2012-10-14 ENCOUNTER — Encounter (HOSPITAL_COMMUNITY): Payer: Self-pay | Admitting: Emergency Medicine

## 2012-10-14 ENCOUNTER — Emergency Department (HOSPITAL_COMMUNITY): Payer: Medicare Other

## 2012-10-14 ENCOUNTER — Observation Stay (HOSPITAL_COMMUNITY)
Admission: EM | Admit: 2012-10-14 | Discharge: 2012-10-16 | Disposition: A | Discharge status: Home / Self Care | Payer: Medicare Other | Attending: Cardiovascular Disease | Admitting: Cardiovascular Disease

## 2012-10-14 DIAGNOSIS — C182 Malignant neoplasm of ascending colon: Secondary | ICD-10-CM | POA: Diagnosis present

## 2012-10-14 DIAGNOSIS — Z9861 Coronary angioplasty status: Secondary | ICD-10-CM | POA: Insufficient documentation

## 2012-10-14 DIAGNOSIS — I10 Essential (primary) hypertension: Secondary | ICD-10-CM

## 2012-10-14 DIAGNOSIS — E611 Iron deficiency: Secondary | ICD-10-CM | POA: Diagnosis present

## 2012-10-14 DIAGNOSIS — Z85038 Personal history of other malignant neoplasm of large intestine: Secondary | ICD-10-CM | POA: Insufficient documentation

## 2012-10-14 DIAGNOSIS — R079 Chest pain, unspecified: Principal | ICD-10-CM | POA: Insufficient documentation

## 2012-10-14 DIAGNOSIS — Z86010 Personal history of colonic polyps: Secondary | ICD-10-CM

## 2012-10-14 DIAGNOSIS — I4949 Other premature depolarization: Secondary | ICD-10-CM | POA: Diagnosis present

## 2012-10-14 DIAGNOSIS — R195 Other fecal abnormalities: Secondary | ICD-10-CM | POA: Diagnosis present

## 2012-10-14 DIAGNOSIS — E538 Deficiency of other specified B group vitamins: Secondary | ICD-10-CM | POA: Diagnosis present

## 2012-10-14 DIAGNOSIS — K219 Gastro-esophageal reflux disease without esophagitis: Secondary | ICD-10-CM | POA: Diagnosis present

## 2012-10-14 DIAGNOSIS — I2510 Atherosclerotic heart disease of native coronary artery without angina pectoris: Secondary | ICD-10-CM | POA: Diagnosis present

## 2012-10-14 DIAGNOSIS — R0602 Shortness of breath: Secondary | ICD-10-CM | POA: Insufficient documentation

## 2012-10-14 DIAGNOSIS — D509 Iron deficiency anemia, unspecified: Secondary | ICD-10-CM | POA: Insufficient documentation

## 2012-10-14 DIAGNOSIS — F411 Generalized anxiety disorder: Secondary | ICD-10-CM | POA: Insufficient documentation

## 2012-10-14 DIAGNOSIS — Z860101 Personal history of adenomatous and serrated colon polyps: Secondary | ICD-10-CM

## 2012-10-14 DIAGNOSIS — G5790 Unspecified mononeuropathy of unspecified lower limb: Secondary | ICD-10-CM | POA: Insufficient documentation

## 2012-10-14 DIAGNOSIS — G629 Polyneuropathy, unspecified: Secondary | ICD-10-CM | POA: Diagnosis present

## 2012-10-14 LAB — CBC
Hemoglobin: 14.1 g/dL (ref 13.0–17.0)
MCH: 30.7 pg (ref 26.0–34.0)
MCHC: 33.8 g/dL (ref 30.0–36.0)
Platelets: 287 10*3/uL (ref 150–400)
RDW: 14.4 % (ref 11.5–15.5)

## 2012-10-14 LAB — CREATININE, SERUM
Creatinine, Ser: 1.12 mg/dL (ref 0.50–1.35)
GFR calc non Af Amer: 62 mL/min — ABNORMAL LOW (ref >90)

## 2012-10-14 LAB — CBC WITH DIFFERENTIAL/PLATELET
Basophils Absolute: 0 10*3/uL (ref 0.0–0.1)
Basophils Relative: 0 % (ref 0–1)
Hemoglobin: 14.6 g/dL (ref 13.0–17.0)
MCHC: 33.9 g/dL (ref 30.0–36.0)
Neutro Abs: 4.5 10*3/uL (ref 1.7–7.7)
Neutrophils Relative %: 46 % (ref 43–77)
RDW: 14.3 % (ref 11.5–15.5)
WBC: 9.7 10*3/uL (ref 4.0–10.5)

## 2012-10-14 LAB — PROTIME-INR
INR: 0.95 (ref 0.00–1.49)
Prothrombin Time: 12.6 seconds (ref 11.6–15.2)

## 2012-10-14 LAB — TROPONIN I: Troponin I: <0.3 ng/mL (ref <0.30)

## 2012-10-14 LAB — BASIC METABOLIC PANEL
Chloride: 107 mEq/L (ref 96–112)
GFR calc Af Amer: 65 mL/min — ABNORMAL LOW (ref >90)
Potassium: 3.8 mEq/L (ref 3.5–5.1)

## 2012-10-14 LAB — APTT: aPTT: 30 seconds (ref 24–37)

## 2012-10-14 MED ORDER — METOPROLOL TARTRATE 50 MG PO TABS
50.0000 mg | ORAL_TABLET | Freq: Two times a day (BID) | ORAL | Status: DC
  Administered 2012-10-14 – 2012-10-16 (×4): 50 mg via ORAL
  Filled 2012-10-14 (×5): qty 1

## 2012-10-14 MED ORDER — ALPRAZOLAM 0.5 MG PO TABS
0.2500 mg | ORAL_TABLET | Freq: Once | ORAL | Status: AC
  Administered 2012-10-14: 0.25 mg via ORAL
  Filled 2012-10-14: qty 1

## 2012-10-14 MED ORDER — FOLIC ACID 1 MG PO TABS
1.0000 mg | ORAL_TABLET | ORAL | Status: DC
  Administered 2012-10-15: 1 mg via ORAL
  Filled 2012-10-14 (×2): qty 1

## 2012-10-14 MED ORDER — PANTOPRAZOLE SODIUM 40 MG PO TBEC
40.0000 mg | DELAYED_RELEASE_TABLET | Freq: Every day | ORAL | Status: DC
  Administered 2012-10-15 – 2012-10-16 (×2): 40 mg via ORAL
  Filled 2012-10-14 (×2): qty 1

## 2012-10-14 MED ORDER — ALPRAZOLAM 0.25 MG PO TABS: 0.2500 mg | ORAL_TABLET | Freq: Three times a day (TID) | ORAL | Status: DC | PRN

## 2012-10-14 MED ORDER — LATANOPROST 0.005 % OP SOLN
1.0000 [drp] | Freq: Every day | OPHTHALMIC | Status: DC
  Administered 2012-10-14 – 2012-10-15 (×2): 1 [drp] via OPHTHALMIC
  Filled 2012-10-14: qty 2.5

## 2012-10-14 MED ORDER — ACETAMINOPHEN 325 MG PO TABS: 650.0000 mg | ORAL_TABLET | ORAL | Status: DC | PRN

## 2012-10-14 MED ORDER — ENOXAPARIN SODIUM 100 MG/ML ~~LOC~~ SOLN
1.0000 mg/kg | Freq: Two times a day (BID) | SUBCUTANEOUS | Status: DC
  Administered 2012-10-15 – 2012-10-16 (×2): 95 mg via SUBCUTANEOUS
  Filled 2012-10-14 (×5): qty 1

## 2012-10-14 MED ORDER — ASPIRIN 81 MG PO CHEW
162.0000 mg | CHEWABLE_TABLET | Freq: Once | ORAL | Status: AC
  Administered 2012-10-14: 162 mg via ORAL
  Filled 2012-10-14: qty 2

## 2012-10-14 MED ORDER — ONDANSETRON HCL 4 MG/2ML IJ SOLN: 4.0000 mg | Freq: Four times a day (QID) | INTRAMUSCULAR | Status: DC | PRN

## 2012-10-14 MED ORDER — CITALOPRAM HYDROBROMIDE 10 MG PO TABS
10.0000 mg | ORAL_TABLET | Freq: Every morning | ORAL | Status: DC
  Administered 2012-10-15 – 2012-10-16 (×2): 10 mg via ORAL
  Filled 2012-10-14 (×2): qty 1

## 2012-10-14 MED ORDER — ADULT MULTIVITAMIN W/MINERALS CH
1.0000 | ORAL_TABLET | Freq: Every day | ORAL | Status: DC
  Administered 2012-10-15 – 2012-10-16 (×2): 1 via ORAL
  Filled 2012-10-14 (×2): qty 1

## 2012-10-14 MED ORDER — LOSARTAN POTASSIUM 50 MG PO TABS
50.0000 mg | ORAL_TABLET | Freq: Every morning | ORAL | Status: DC
  Administered 2012-10-15 – 2012-10-16 (×2): 50 mg via ORAL
  Filled 2012-10-14 (×2): qty 1

## 2012-10-14 MED ORDER — ASPIRIN EC 81 MG PO TBEC
81.0000 mg | DELAYED_RELEASE_TABLET | Freq: Every day | ORAL | Status: DC
  Administered 2012-10-15: 81 mg via ORAL
  Filled 2012-10-14: qty 1

## 2012-10-14 MED ORDER — ENOXAPARIN SODIUM 100 MG/ML ~~LOC~~ SOLN
1.0000 mg/kg | Freq: Once | SUBCUTANEOUS | Status: AC
  Administered 2012-10-14: 95 mg via SUBCUTANEOUS
  Filled 2012-10-14: qty 1

## 2012-10-14 MED ORDER — SIMVASTATIN 20 MG PO TABS
20.0000 mg | ORAL_TABLET | Freq: Every day | ORAL | Status: DC
  Administered 2012-10-15 – 2012-10-16 (×2): 20 mg via ORAL
  Filled 2012-10-14 (×2): qty 1

## 2012-10-14 MED ORDER — NITROGLYCERIN 0.4 MG SL SUBL
0.4000 mg | SUBLINGUAL_TABLET | Freq: Once | SUBLINGUAL | Status: AC
  Administered 2012-10-14: 0.4 mg via SUBLINGUAL
  Filled 2012-10-14: qty 25

## 2012-10-14 MED ORDER — NITROGLYCERIN 0.4 MG SL SUBL: 0.4000 mg | SUBLINGUAL_TABLET | SUBLINGUAL | Status: DC | PRN

## 2012-10-14 NOTE — H&P (Signed)
[facility] History and Physical  [name]. AVW:[id number] DOB: 23-Nov-1934 DOA: 10/14/2012  Referring physician: Etheleen Mayhew PCP: Louie Boston, MD  Specialists:  Cardiologist: Dr Royann Shivers  Oncologist: Dr Laurie Panda   Chief Complaint: Chest Pain  HPI: David Pugh. 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 The chest pain started around 3PM today after he shoveled snow and then carried some heavy suitcases up a flight of steps. He described the pain as "pressure" and "throbbing" central chest substernal without radiation. It was associated with nausea but no SOB or diaphoresis, no near syncope or dizziness. He was frightened and panicked  by the event because it reminded him of his previous unstable angina so he took 2 baby ASA and an NTG from home X1 and immediately sat down and rested. This seemed to help but did not completely remove the pressure sensation in his chest so he came to the ED for evaluation.  On EKG he has TWI in V4 and V5, flattening in V2, V3- unchanged from priors. His troponin was negative. He received an addituional dose of sublingual NTG which completely resolved his chest pain in the ED. EDP discussed with Dr. Allyson Sabal who recommended transfer to [facility] to hospitalists for cardiology consultation.  Review of Systems: The patient denies anorexia, fever, weight loss,vision loss, decreased hearing, hoarseness,  syncope, dyspnea on exertion, peripheral edema, hemoptysis, abdominal pain, melena, hematochezia, severe indigestion/heartburn, hematuria, incontinence, genital sores, muscle weakness, suspicious skin lesions, transient blindness, depression, unusual weight change, abnormal bleeding, enlarged lymph nodes, angioedema, and breast masses.   Past Medical History  Diagnosis Date   . Premature ventricular contraction   . Prostate cancer 2010  . 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 Radiation Feb 2011    treatment  . Biliary dyskinesia   . GERD (gastroesophageal reflux disease)   . Hiatal hernia 2010    tiny  . HTN (hypertension)   . Rosacea   . PTSD (post-traumatic stress disorder)   . Angina pectoris, unstable   . Diverticula of colon 2010  . Colon polyps     tubular adenomas  . Arthritis   . Exposure to [employer]   . CAD (coronary artery disease)     hx of 2 stents   . Peripheral neuropathy   . Anemia     hx of anemia as a child   . Colon cancer 12/2011    s/p right hemicolectomy, did not tolerate chemo  . Iron deficiency 05/02/2012    followed by Dr. Mariel Sleet, iron infusions.   . Folic acid deficiency    Past Surgical History  Procedure Laterality Date  . Cholecystectomy    . Prostatectomy    . Tonsillectomy    . Transurethral resection of prostate    . Esophagogastroduodenoscopy  10/10/2008    Dr. Maceo Pro hiatal hernia, normal esophagus, normal stomach  . Colonoscopy  02/12/2004    Dr. Jena Gauss- L side diverticula, inflammatory  colon polyps  . Esophagogastroduodenoscopy  02/12/2004    Dr. Geni Bers- normal   . Carotid stent  02/14/11  . Coronary stent placement  01/2011  . 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 Multiple colonic  polyps removed as described above. Sigmoid Diverticulosis/  1.5 x 2 cm relatively flat ulcerated lesion in cecum/path showed adenocarcinoma of the colon. descending colon polyp with tubular adenoma and one fragment of focal high grade dysplasia.   . Knee surgery      left knee   . Partial colectomy  02/29/2012    Procedure: PARTIAL COLECTOMY;  Surgeon: Ernestene Mention, MD;  Location: [facility] ORS;  Service: General;  Laterality: Right;  . Vasectomy    . Cardiac catheterization  02/13/2011    [facility], [facility] cardiology  . Colonoscopy   08/28/2012    Procedure: COLONOSCOPY;  Surgeon: Corbin Ade, MD;  Location: [facility] ENDO SUITE;  Service: Endoscopy;  Laterality: N/A;  10:30   Social History:  reports that he has never smoked. He has never used smokeless tobacco. He reports that  drinks alcohol. He reports that he does not use illicit drugs. 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 Lives with his wife, has young grandchildren.  Allergies  Allergen Reactions  . Shellfish Allergy Anaphylaxis  . Sulfonamide Derivatives Diarrhea    Family History  Problem Relation Age of Onset  . Throat cancer Father   . Cancer Father     Throat  . Heart attack    . Prostate cancer Brother   . Cancer Brother     Prostate and Skin  . Colon cancer Neg Hx     Prior to Admission medications   Medication Sig Start Date End Date Taking? Authorizing Provider  acetaminophen (TYLENOL) 650 MG CR tablet Take 1,300 mg by mouth 2 (two) times daily.   Yes Historical Provider, MD  ALPRAZolam Prudy Feeler) 0.25 MG tablet Take 0.25 mg by mouth at bedtime as needed. Anxiety. 03/24/12  Yes Randall An, MD  aspirin EC 81 MG tablet Take 81 mg by mouth at bedtime.    Yes Historical Provider, MD  B Complex-Folic Acid CAPS Take 1 capsule by mouth every morning.    Yes Historical Provider, MD  citalopram (CELEXA) 10 MG tablet Take 10 mg by mouth every morning.    Yes Historical Provider, MD  dexlansoprazole (DEXILANT) 60 MG capsule Take 60 mg by mouth daily.   Yes Historical Provider, MD  folic acid (FOLVITE) 1 MG tablet Take 1 mg by mouth once a week. 03/27/12  Yes Randall An, MD  latanoprost (XALATAN) 0.005 % ophthalmic solution Place 1 drop into both eyes at bedtime.    Yes Historical Provider, MD  losartan (COZAAR) 50 MG tablet Take 50 mg by mouth every morning.    Yes Historical Provider, MD  metoprolol (LOPRESSOR) 50 MG tablet Take 50 mg by mouth 2 (two) times daily.    Yes Historical Provider, MD  Multiple Vitamin  (MULTIVITAMIN WITH MINERALS) TABS Take 1 tablet by mouth daily.   Yes Historical Provider, MD  nitroGLYCERIN (NITROSTAT) 0.4 MG SL tablet Place 0.4 mg under the tongue every 5 (five) minutes x 3 doses as needed. For chest pain   Yes Historical Provider, MD  pravastatin (PRAVACHOL) 40 MG tablet Take 40 mg by mouth at bedtime. Pt takes at bedtime   Yes Historical Provider, MD   Physical Exam: Filed Vitals:   10/14/12 1707 10/14/12 1800 10/14/12 1915 10/14/12 1956  BP:  146/75  137/65  Pulse: 65 66 62   Temp: 98.5 F (36.9 C)     Resp: 20 14 17 19   Height:      Weight:      SpO2: 97% 95% 98% 97%  General:  AOX3, pleasant cooperative, well nourished  Eyes: normal  ENT: normal  Neck: no JVD, supple, no adenopathy  Cardiovascular: RRR no MRG - no chest wall tenderness  Respiratory: CTAB  Abdomen: soft, NT, BS active  Skin: no rashes or lesions  Musculoskeletal: normal muscle bulk and tone  Psychiatric: appropriate  Neurologic: non-focal  Labs on Admission:  Basic Metabolic Panel:  Recent Labs Lab 10/10/12 1002 10/14/12 1742  NA 143 141  K 4.6 3.8  CL 106 107  CO2 28 24  GLUCOSE 98 110*  BUN 18 15  CREATININE 1.26 1.22  CALCIUM 9.7 9.2   Liver Function Tests:  Recent Labs Lab 10/10/12 1002  AST 19  ALT 19  ALKPHOS 62  BILITOT 0.5  PROT 7.1  ALBUMIN 3.9  CBC:  Recent Labs Lab 10/14/12 1742  WBC 9.7  NEUTROABS 4.5  HGB 14.6  HCT 43.1  MCV 92.9  PLT 319   Cardiac Enzymes:  Recent Labs Lab 10/14/12 1742  TROPONINI <0.30    BNP (last 3 results) No results found for this basename: PROBNP,  in the last 8760 hours CBG: No results found for this basename: GLUCAP,  in the last 168 hours  Radiological Exams on Admission: Dg Chest Portable 1 View  10/14/2012  *RADIOLOGY REPORT*  Clinical Data: Chest pain  PORTABLE CHEST - 1 VIEW  Comparison: 03/11/2012 and prior chest radiographs  Findings: Upper limits normal heart size again noted. There  is no evidence of focal airspace disease, pulmonary edema, suspicious pulmonary nodule/mass, pleural effusion, or pneumothorax. No acute bony abnormalities are identified.  IMPRESSION: No evidence of acute cardiopulmonary disease.   Original Report Authenticated By: Harmon Pier, M.D.     EKG: Independently reviewed. TWI V3,V4 and flat T in V2-3 unchaged from prior. Occasional PVC.  Assessment/Plan Principal Problem:   Chest pain Active Problems:   PREMATURE VENTRICULAR CONTRACTIONS   GERD   Cancer of ascending colon   Iron deficiency   Hx of adenomatous colonic polyps   Folic acid deficiency   Peripheral neuropathy   ASCVD (arteriosclerotic cardiovascular disease)   HTN (hypertension)   1. Chest Pain, exertional in setting of known ASCVD, HTN and prior stent placement. History concerning for unstable angina along with his risk factors. EKG with PVCs and minor abnormlities in T waves in lateral leads. Troponin negative. His chest wall is not tender and I cannot reproduce pain with UE movement to suggest MSK cause.   Transfer to [facility] for [facility] evaluation and consultation  Telemetry and cycle cardiac enzymes  ASA, NTG, Metoprolol, O2 given along with a full ACS treatment dose of Lovenox per Dr. Allyson Sabal recommendations.  2. GERD, no overt reflux symptoms.  On Dexilant, will continue PPI  3. HTN, continued home ARB and Beta Blocker  4. Colon Cancer, he is s/p resection, 2 nodes positive, normal CEA.  5. Anemia, resolved, related to B12/Folate deficiency and GI blood loss prior to colon resection.  6. Peripheral neuropathy, loss of sensation in feet mild gait problems. Prn pain medication.  7. Anxiety, prn alprazolam low, dose -per wife he is sensitive to benzos and they are quite sedation for him.     Code Status: Full Code Family Communication: Discussed plan of care including transport to [facility]. Disposition Plan: Admitted as OBS for CP/ACS evaluation. Time spent: 70  minutes  [facility] Pager [phone]  If 7PM-7AM, please contact night-coverage [email or web address] Password [facility] 10/14/2012, 8:38 PM

## 2012-10-14 NOTE — ED Notes (Signed)
Pt and family informed that pt will be [facility] and [facility]

## 2012-10-14 NOTE — ED Notes (Signed)
Gave report to sean with [facility]

## 2012-10-14 NOTE — ED Notes (Signed)
[facility] here now for pt

## 2012-10-14 NOTE — ED Notes (Addendum)
Pt c/o central cp today after shoveling snow and walking up stairs. Pt took 162 mg asa and one 0.4mg  sl nitro pta. Pt also reports acid reflux x 3 weeks.

## 2012-10-14 NOTE — ED Provider Notes (Signed)
History    This chart was scribed for David Booze, MD by Melba Coon, ED Scribe. The patient was seen in room APA14/[facility]14 and the patient's care was started at 5:29PM.    CSN: [id number]  Arrival date & time 10/14/12  1655   First MD Initiated Contact with Patient 10/14/12 1715      Chief Complaint  Patient presents with  . Chest Pain    (Consider location/radiation/quality/duration/timing/severity/associated sxs/prior treatment) The history is provided by the patient. No language interpreter was used.   Mann Skaggs. is a 78 y.o. male who presents to the Emergency Department complaining of constant, moderate chest pain/"heaviness" with associated SOB, generalized weakness and nausea with an onset 3:00PM today. He reports a history of heart stents in 2012. He reports he was walking up some steep stairs carrying suitcases after shoveling some snow when the chest heaviness started. He reports that the severity of the pain was 8/10 initially but is now 2-3/10. He repots at 4:00 PM he took low dose ASA x2 and at 4:30PM he took NTG x1, all of which alleviated the pain but did not make the pain go completely away. Sitting down and resting alleviates the pain. He thinks he may have had a panic attack; he reports he is mildly paranoid at baseline. He works out on his elliptical for 35 min with 35 sit ups daily; he did this exercise routine today as well.  He reports GERD symptoms 3 weeks ago in his chest that was worsened when he woke up in the morning; he takes medications for it and stopped taking Tylenol which alleviates the symptoms but has never work him up in the middle of the night.   Denies HA, fever, sweats, neck pain, sore throat, rash, back pain, abdominal pain, emesis, diarrhea, dysuria, or extremity pain, edema, numbness, or tingling. History of colon cancer, prostate cancer, and arthritis. On 10/10/12, he saw his oncologist; lab results showed that his CEA has decreased from  1.0 to 0.7 in 2 months. No other pertinent medical symptoms.  PCP; Dr. Wyvonnia Lora Cardiologist: Dr Royann Shivers Oncologist: Dr Laurie Panda  Past Medical History  Diagnosis Date  . Premature ventricular contraction   . Prostate cancer 2010  . 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 Radiation Feb 2011    treatment  . Biliary dyskinesia   . GERD (gastroesophageal reflux disease)   . Hiatal hernia 2010    tiny  . HTN (hypertension)   . Rosacea   . PTSD (post-traumatic stress disorder)   . Angina pectoris, unstable   . Diverticula of colon 2010  . Colon polyps     tubular adenomas  . Arthritis   . Exposure to [employer]   . CAD (coronary artery disease)     hx of 2 stents   . Peripheral neuropathy   . Anemia     hx of anemia as a child   . Colon cancer 12/2011    s/p right hemicolectomy, did not tolerate chemo  . Iron deficiency 05/02/2012    followed by Dr. Mariel Sleet, iron infusions.   . Folic acid deficiency     Past Surgical History  Procedure Laterality Date  . Cholecystectomy    . Prostatectomy    . Tonsillectomy    . Transurethral resection of prostate    . Esophagogastroduodenoscopy  10/10/2008    Dr. Maceo Pro hiatal hernia, normal esophagus, normal stomach  . Colonoscopy  02/12/2004  Dr. Jena Gauss- L side diverticula, inflammatory  colon polyps  . Esophagogastroduodenoscopy  02/12/2004    Dr. Geni Bers- normal   . Carotid stent  02/14/11  . Coronary stent placement  01/2011  . 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 Multiple colonic  polyps removed as described above. Sigmoid Diverticulosis/ 1.5 x 2 cm relatively flat ulcerated lesion in cecum/path showed adenocarcinoma of the colon. descending colon polyp with tubular adenoma and one fragment of focal high grade dysplasia.   . Knee surgery      left knee   . Partial colectomy  02/29/2012    Procedure: PARTIAL COLECTOMY;  Surgeon: Ernestene Mention, MD;  Location: [facility] ORS;  Service: General;  Laterality: Right;  . Vasectomy    . Cardiac catheterization  02/13/2011    [facility], [facility] cardiology  . Colonoscopy  08/28/2012    Procedure: COLONOSCOPY;  Surgeon: Corbin Ade, MD;  Location: [facility] ENDO SUITE;  Service: Endoscopy;  Laterality: N/A;  10:30    Family History  Problem Relation Age of Onset  . Throat cancer Father   . Cancer Father     Throat  . Heart attack    . Prostate cancer Brother   . Cancer Brother     Prostate and Skin  . Colon cancer Neg Hx     History  Substance Use Topics  . Smoking status: Never Smoker   . Smokeless tobacco: Never Used  . Alcohol Use: Yes     Comment: 1 cocktail/week      Review of Systems  Constitutional: Negative for chills.  Respiratory: Positive for shortness of breath.   Cardiovascular: Positive for chest pain.  Gastrointestinal: Positive for nausea. Negative for vomiting.  Neurological: Positive for weakness.  All other systems reviewed and are negative.    Allergies  Shellfish allergy and Sulfonamide derivatives  Home Medications   Current Outpatient Rx  Name  Route  Sig  Dispense  Refill  . acetaminophen (TYLENOL) 650 MG CR tablet   Oral   Take 1,300 mg by mouth 2 (two) times daily.         Marland Kitchen ALPRAZolam (XANAX) 0.25 MG tablet   Oral   Take 0.25 mg by mouth at bedtime as needed. Anxiety.         Marland Kitchen aspirin EC 81 MG tablet   Oral   Take 81 mg by mouth at bedtime.          . B Complex-Folic Acid CAPS   Oral   Take 1 capsule by mouth every morning.          . citalopram (CELEXA) 10 MG tablet   Oral   Take 10 mg by mouth every morning.          Marland Kitchen dexlansoprazole (DEXILANT) 60 MG capsule   Oral   Take 60 mg by mouth daily.         . folic acid (FOLVITE) 1 MG tablet   Oral   Take 1 mg by mouth once a week.         . latanoprost (XALATAN) 0.005 % ophthalmic solution   Both Eyes   Place 1 drop into both eyes at bedtime.           Marland Kitchen losartan (COZAAR) 50 MG tablet   Oral   Take 50 mg by mouth every morning.          . metoprolol (LOPRESSOR) 50 MG  tablet   Oral   Take 50 mg by mouth 2 (two) times daily.          . Multiple Vitamin (MULTIVITAMIN WITH MINERALS) TABS   Oral   Take 1 tablet by mouth daily.         . nitroGLYCERIN (NITROSTAT) 0.4 MG SL tablet   Sublingual   Place 0.4 mg under the tongue every 5 (five) minutes x 3 doses as needed. For chest pain         . pravastatin (PRAVACHOL) 40 MG tablet   Oral   Take 40 mg by mouth at bedtime. Pt takes at bedtime           Pulse 65  Temp(Src) 98.5 F (36.9 C)  Resp 20  Ht 5\' 10"  (1.778 m)  Wt 210 lb (95.255 kg)  BMI 30.13 kg/m2  SpO2 97%  Physical Exam  Nursing note and vitals reviewed. Constitutional: He is oriented to person, place, and time. He appears well-developed and well-nourished.  Non-toxic appearance. He does not appear ill. No distress.  HENT:  Head: Normocephalic and atraumatic.  Right Ear: External ear normal.  Left Ear: External ear normal.  Nose: Nose normal. No mucosal edema or rhinorrhea.  Mouth/Throat: Oropharynx is clear and moist and mucous membranes are normal. No dental abscesses or edematous.  Eyes: Conjunctivae and EOM are normal. Pupils are equal, round, and reactive to light.  Neck: Normal range of motion and full passive range of motion without pain. Neck supple.  Cardiovascular: Normal rate, regular rhythm and normal heart sounds.  Exam reveals no gallop and no friction rub.   No murmur heard. Pulmonary/Chest: Effort normal and breath sounds normal. No respiratory distress. He has no wheezes. He has no rhonchi. He has no rales. He exhibits no tenderness and no crepitus.  Abdominal: Soft. Normal appearance and bowel sounds are normal. He exhibits no distension. There is no tenderness. There is no rebound and no guarding.  Musculoskeletal: Normal range of motion. He exhibits no edema and no tenderness.  Moves  all extremities well.   Neurological: He is alert and oriented to person, place, and time. He has normal strength. No cranial nerve deficit.  Skin: Skin is warm, dry and intact. No rash noted. No erythema. No pallor.  Psychiatric: He has a normal mood and affect. His speech is normal and behavior is normal. His mood appears not anxious.    ED Course  Procedures (including critical care time)  DIAGNOSTIC STUDIES: Oxygen Saturation is 97% on room air, normal by my interpretation.    COORDINATION OF CARE:  5:45PM - EKG reviewed and is unremarkable. NTG, ASA, CXR, APTT, BMP, INR, troponin, and CBC with differential will be ordered for [name].  6:05PM - imaging results reviewed and is unremarkable   Labs Reviewed  BASIC METABOLIC PANEL - Abnormal; Notable for the following:    Glucose, Bld 110 (*)    GFR calc non Af Amer 56 (*)    GFR calc Af Amer 65 (*)    All other components within normal limits  CBC WITH DIFFERENTIAL  PROTIME-INR  TROPONIN I  APTT   Dg Chest Portable 1 View  10/14/2012  *RADIOLOGY REPORT*  Clinical Data: Chest pain  PORTABLE CHEST - 1 VIEW  Comparison: 03/11/2012 and prior chest radiographs  Findings: Upper limits normal heart size again noted. There is no evidence of focal airspace disease, pulmonary edema, suspicious pulmonary nodule/mass, pleural effusion, or pneumothorax. No acute bony  abnormalities are identified.  IMPRESSION: No evidence of acute cardiopulmonary disease.   Original Report Authenticated By: Harmon Pier, M.D.     Date: 10/14/2012  Rate: 64  Rhythm: normal sinus rhythm and premature ventricular contractions (PVC)  QRS Axis: normal  Intervals: normal  ST/T Wave abnormalities: nonspecific T wave changes  Conduction Disutrbances:none  Narrative Interpretation: Occasional PVC, nonspecific T wave changes. When compared with ECG of 03/11/2012, no significant changes are seen.  Old EKG Reviewed: unchanged    1. Chest pain        MDM  Chest pain worrisome for angina and/or acute coronary syndrome. He got partial relief from nitroglycerin at home but only received one nitroglycerin. He will be given additional nitroglycerin. He only took 162 mg of aspirin will be given an additional 162 mg. Old records are reviewed and he had stents placed in 2 vessels but there was a third blockage which was 95% and a small vessel that was not amenable to PCI. So he does have myocardium at risk.  6:34 PM He got complete relief with an additional nitroglycerin. However, because of the length of time he was having chest discomfort, until he could be prudent to admit him for serial cardiac markers and consideration for provocative testing and consideration for repeat catheterization.  Case is discussed with Dr. Allyson Sabal of [facility] heart and vascular who requests the patient be admitted to the hospitalist service. Case is discussed with Dr. Phillips Odor who will come and see the patient and arranged to transfer him to [facility] for serial cardiac markers. He was given a dose of enoxaparin in the ED.  I personally performed the services described in this documentation, which was scribed in my presence. The recorded information has been reviewed and is accurate.          David Booze, MD 10/14/12 [phone]

## 2012-10-14 NOTE — ED Notes (Signed)
At this time pt denies chest pain, states 0/10. Resting comfortably, NAD, NSR on monitor. Informed that we are waiting on a bed and to see if he will be transferred to [facility] or kept at [facility].

## 2012-10-15 LAB — CBC
Hemoglobin: 13.9 g/dL (ref 13.0–17.0)
MCH: 30.7 pg (ref 26.0–34.0)
MCHC: 33.6 g/dL (ref 30.0–36.0)
Platelets: 260 10*3/uL (ref 150–400)
RDW: 14.2 % (ref 11.5–15.5)

## 2012-10-15 LAB — BASIC METABOLIC PANEL
BUN: 14 mg/dL (ref 6–23)
Calcium: 8.7 mg/dL (ref 8.4–10.5)
Creatinine, Ser: 1.11 mg/dL (ref 0.50–1.35)
GFR calc Af Amer: 73 mL/min — ABNORMAL LOW (ref >90)
GFR calc non Af Amer: 63 mL/min — ABNORMAL LOW (ref >90)
Glucose, Bld: 93 mg/dL (ref 70–99)
Potassium: 3.7 mEq/L (ref 3.5–5.1)

## 2012-10-15 LAB — LIPID PANEL
Cholesterol: 120 mg/dL (ref 0–200)
LDL Cholesterol: 48 mg/dL (ref 0–99)
Total CHOL/HDL Ratio: 2.4 RATIO
Triglycerides: 104 mg/dL (ref <150)
VLDL: 21 mg/dL (ref 0–40)

## 2012-10-15 LAB — TROPONIN I: Troponin I: <0.3 ng/mL (ref <0.30)

## 2012-10-15 MED ORDER — ZOLPIDEM TARTRATE 5 MG PO TABS: 5.0000 mg | ORAL_TABLET | Freq: Every evening | ORAL | Status: DC | PRN

## 2012-10-15 MED ORDER — ASPIRIN 325 MG PO TABS
325.0000 mg | ORAL_TABLET | Freq: Every day | ORAL | Status: DC
  Administered 2012-10-16: 325 mg via ORAL
  Filled 2012-10-15 (×2): qty 1

## 2012-10-15 NOTE — Progress Notes (Cosign Needed)
[facility] progress note. Chief complaint. Transfer note. History of present illness. This 78 year old male presented to [facility] with complaints of chest pain. Patient has a known history of coronary artery disease status post stent placement. His EKG appeared unchanged from priors and initial troponin was negative. The case was discussed with cardiology and they requested transfer to [facility]. The patient has now arrived and I am seeing him at bedside to ensure he remained stable and that his orders transferred appropriately. Patient currently has no complaints of chest pain. Vital signs. Temperature 98.6, pulse 1:15, respiration 18, blood pressure 144/84. O2 sats 95%. General appearance. Well-developed elderly male who is alert, cooperative, and in no distress. Cardiac. Rate and rhythm regular. No jugular venous distention or edema. Lungs. Breath sounds clear and equal. Abdomen. Soft with positive bowel sounds. No pain. Impression/plan. Problem #1. Chest pain. Patient appears stable post transfer and is chest pain-free. Patient is on telemetry with aspirin, nitroglycerin, Metroprolol, and O2. ACE treatment dose of Lovenox per Dr. Hazle Coca recommendation. Cardiology will be consultation on the patient per [facility] heart and vascular later this a.m. All orders appear to of transferred appropriately.

## 2012-10-15 NOTE — Consult Note (Signed)
Reason for Consult: Chest Pain  Referring Physician: Edsel Petrin, DO Primary Cardiologist: Dr Ernest Pine   HPI: The patient is a 78 y/o male with known CAD. He has been followed by Dr. Ernest Pine. In 2012, Dr. Rennis Golden performed a diagnostic cath, for unstable angina, which revealed three-vessel disease with distal RCA/PLA disease, and LAD disease with normal LV function. It was decided to proceed with percutaneous intervention of his LAD and ramus branch and to treat the other lesions medically. The intervention was performed by Dr. Allyson Sabal. Both the LAD and ramus branch were treated with DES. The patient last saw Dr. Royann Shivers, in clinic, in Dec. 2012. At that time, he had no additional cardiac problems postoperatively. His other history is significant for HTN, prostate cancer, colon cancer and persistent iron deficiency anemia. He is S/P right hemicolectomy (02/2011).  He initially presented to [facility] yesterday evening with a complaint of substernal chest pressure/heaviness. He reports that he his fairly active and has not had any chest pain since yesterday. Yesterday morning he did 35 minutes on the elliptical, as well as interval training and felt no discomfort. His CP started yesterday afternoon. He reports shoveling snow around 1:30. He had no discomfort at that time, but later felt substernal chest pressure, roughly 1 hour later, after carrying two heavy suitcases up the stairs. At that time, he also felt very weak and fatigue. He denies associated SOB, diaphoresis, palpitations, lightheadedness/dizziness, presyncope/syncope. He had nausea, but no vomiting. He denies radiation to the arms, back, neck or jaw. The discomfort was 7/10. The pain did not go way with rest. After ~ 1 hr he decided to take two baby aspirin and SL NTG x 1. His discomfort improved to ~4/10.  He then decided to go to the ED. There, he was given a second dose of SL NTG and the discomfort resolved completely. An EKG at  [facility] showed TWI in V4  and V5. He was transferred here to [facility] for further evaluation. He denies any further pain.   He reports compliance with all his heart medications including a BB, ARB and statin. He states that his ASA was stopped ~ 1 month ago due to bleeding issues. He reports that his oncologist contacted Dr. Royann Shivers prior for approval.   Past Medical History  Diagnosis Date  . Premature ventricular contraction   . Prostate cancer 2010  . 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 Radiation Feb 2011    treatment  . Biliary dyskinesia   . GERD (gastroesophageal reflux disease)   . Hiatal hernia 2010    tiny  . HTN (hypertension)   . Rosacea   . PTSD (post-traumatic stress disorder)   . Angina pectoris, unstable   . Diverticula of colon 2010  . Colon polyps     tubular adenomas  . Arthritis   . Exposure to [employer]   . CAD (coronary artery disease)     hx of 2 stents   . Peripheral neuropathy   . Anemia     hx of anemia as a child   . Colon cancer 12/2011    s/p right hemicolectomy, did not tolerate chemo  . Iron deficiency 05/02/2012    followed by Dr. Mariel Sleet, iron infusions.   . Folic acid deficiency     Past Surgical History  Procedure Laterality Date  . Cholecystectomy    . Prostatectomy    . Tonsillectomy    . Transurethral resection of prostate    .  Esophagogastroduodenoscopy  10/10/2008    Dr. Maceo Pro hiatal hernia, normal esophagus, normal stomach  . Colonoscopy  02/12/2004    Dr. Jena Gauss- L side diverticula, inflammatory  colon polyps  . Esophagogastroduodenoscopy  02/12/2004    Dr. Geni Bers- normal   . Carotid stent  02/14/11  . Coronary stent placement  01/2011  . 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 Multiple colonic  polyps removed as described above. Sigmoid Diverticulosis/ 1.5 x 2 cm relatively flat ulcerated lesion in cecum/path showed adenocarcinoma of the colon.  descending colon polyp with tubular adenoma and one fragment of focal high grade dysplasia.   . Knee surgery      left knee   . Partial colectomy  02/29/2012    Procedure: PARTIAL COLECTOMY;  Surgeon: Ernestene Mention, MD;  Location: [facility] ORS;  Service: General;  Laterality: Right;  . Vasectomy    . Cardiac catheterization  02/13/2011    [facility] cardiology  . Colonoscopy  08/28/2012    Procedure: COLONOSCOPY;  Surgeon: Corbin Ade, MD;  Location: [facility] ENDO SUITE;  Service: Endoscopy;  Laterality: N/A;  10:30    Family History  Problem Relation Age of Onset  . Throat cancer Father   . Cancer Father     Throat  . Heart attack    . Prostate cancer Brother   . Cancer Brother     Prostate and Skin  . Colon cancer Neg Hx     Social History:  reports that he has never smoked. He has never used smokeless tobacco. He reports that  drinks alcohol. He reports that he does not use illicit drugs.  Allergies:  Allergies  Allergen Reactions  . Shellfish Allergy Anaphylaxis  . Sulfonamide Derivatives Diarrhea    Medications:  Prior to Admission:  Prescriptions prior to admission  Medication Sig Dispense Refill  . acetaminophen (TYLENOL) 650 MG CR tablet Take 1,300 mg by mouth 2 (two) times daily.      Marland Kitchen ALPRAZolam (XANAX) 0.25 MG tablet Take 0.25 mg by mouth at bedtime as needed. Anxiety.      Marland Kitchen aspirin EC 81 MG tablet Take 81 mg by mouth at bedtime.       . B Complex-Folic Acid CAPS Take 1 capsule by mouth every morning.       . citalopram (CELEXA) 10 MG tablet Take 10 mg by mouth every morning.       Marland Kitchen dexlansoprazole (DEXILANT) 60 MG capsule Take 60 mg by mouth daily.      . folic acid (FOLVITE) 1 MG tablet Take 1 mg by mouth once a week.      . latanoprost (XALATAN) 0.005 % ophthalmic solution Place 1 drop into both eyes at bedtime.       Marland Kitchen losartan (COZAAR) 50 MG tablet Take 50 mg by mouth every morning.       . metoprolol (LOPRESSOR) 50 MG tablet Take 50 mg by mouth 2 (two)  times daily.       . Multiple Vitamin (MULTIVITAMIN WITH MINERALS) TABS Take 1 tablet by mouth daily.      . nitroGLYCERIN (NITROSTAT) 0.4 MG SL tablet Place 0.4 mg under the tongue every 5 (five) minutes x 3 doses as needed. For chest pain      . pravastatin (PRAVACHOL) 40 MG tablet Take 40 mg by mouth at bedtime. Pt takes at bedtime        Results for orders placed  during the hospital encounter of 10/14/12 (from the past 48 hour(s))  CBC WITH DIFFERENTIAL     Status: None   Collection Time    10/14/12  5:42 PM      Result Value Range   WBC 9.7  4.0 - 10.5 K/uL   RBC 4.64  4.22 - 5.81 MIL/uL   Hemoglobin 14.6  13.0 - 17.0 g/dL   HCT 16.1  09.6 - 04.5 %   MCV 92.9  78.0 - 100.0 fL   MCH 31.5  26.0 - 34.0 pg   MCHC 33.9  30.0 - 36.0 g/dL   RDW 40.9  81.1 - 91.4 %   Platelets 319  150 - 400 K/uL   Neutrophils Relative 46  43 - 77 %   Neutro Abs 4.5  1.7 - 7.7 K/uL   Lymphocytes Relative 39  12 - 46 %   Lymphs Abs 3.7  0.7 - 4.0 K/uL   Monocytes Relative 10  3 - 12 %   Monocytes Absolute 1.0  0.1 - 1.0 K/uL   Eosinophils Relative 5  0 - 5 %   Eosinophils Absolute 0.4  0.0 - 0.7 K/uL   Basophils Relative 0  0 - 1 %   Basophils Absolute 0.0  0.0 - 0.1 K/uL  BASIC METABOLIC PANEL     Status: Abnormal   Collection Time    10/14/12  5:42 PM      Result Value Range   Sodium 141  135 - 145 mEq/L   Potassium 3.8  3.5 - 5.1 mEq/L   Chloride 107  96 - 112 mEq/L   CO2 24  19 - 32 mEq/L   Glucose, Bld 110 (*) 70 - 99 mg/dL   BUN 15  6 - 23 mg/dL   Creatinine, Ser 7.82  0.50 - 1.35 mg/dL   Calcium 9.2  8.4 - 95.6 mg/dL   GFR calc non Af Amer 56 (*) >90 mL/min   GFR calc Af Amer 65 (*) >90 mL/min   Comment:            The eGFR has been calculated     using the CKD EPI equation.     This calculation has not been     validated in all clinical     situations.     eGFR's persistently     <90 mL/min signify     possible Chronic Kidney Disease.  PROTIME-INR     Status: None    Collection Time    10/14/12  5:42 PM      Result Value Range   Prothrombin Time 12.6  11.6 - 15.2 seconds   INR 0.95  0.00 - 1.49  TROPONIN I     Status: None   Collection Time    10/14/12  5:42 PM      Result Value Range   Troponin I <0.30  <0.30 ng/mL   Comment:            Due to the release kinetics of cTnI,     a negative result within the first hours     of the onset of symptoms does not rule out     myocardial infarction with certainty.     If myocardial infarction is still suspected,     repeat the test at appropriate intervals.  APTT     Status: None   Collection Time    10/14/12  5:42 PM      Result Value Range  aPTT 30  24 - 37 seconds  TROPONIN I     Status: None   Collection Time    10/14/12 10:10 PM      Result Value Range   Troponin I <0.30  <0.30 ng/mL   Comment:            Due to the release kinetics of cTnI,     a negative result within the first hours     of the onset of symptoms does not rule out     myocardial infarction with certainty.     If myocardial infarction is still suspected,     repeat the test at appropriate intervals.  CBC     Status: None   Collection Time    10/14/12 10:10 PM      Result Value Range   WBC 10.1  4.0 - 10.5 K/uL   RBC 4.59  4.22 - 5.81 MIL/uL   Hemoglobin 14.1  13.0 - 17.0 g/dL   HCT 09.8  11.9 - 14.7 %   MCV 90.8  78.0 - 100.0 fL   MCH 30.7  26.0 - 34.0 pg   MCHC 33.8  30.0 - 36.0 g/dL   RDW 82.9  56.2 - 13.0 %   Platelets 287  150 - 400 K/uL  CREATININE, SERUM     Status: Abnormal   Collection Time    10/14/12 10:10 PM      Result Value Range   Creatinine, Ser 1.12  0.50 - 1.35 mg/dL   GFR calc non Af Amer 62 (*) >90 mL/min   GFR calc Af Amer 72 (*) >90 mL/min   Comment:            The eGFR has been calculated     using the CKD EPI equation.     This calculation has not been     validated in all clinical     situations.     eGFR's persistently     <90 mL/min signify     possible Chronic Kidney Disease.   TROPONIN I     Status: None   Collection Time    10/15/12  6:30 AM      Result Value Range   Troponin I <0.30  <0.30 ng/mL   Comment:            Due to the release kinetics of cTnI,     a negative result within the first hours     of the onset of symptoms does not rule out     myocardial infarction with certainty.     If myocardial infarction is still suspected,     repeat the test at appropriate intervals.  LIPID PANEL     Status: None   Collection Time    10/15/12  6:30 AM      Result Value Range   Cholesterol 120  0 - 200 mg/dL   Triglycerides 865  <784 mg/dL   HDL 51  >69 mg/dL   Total CHOL/HDL Ratio 2.4     VLDL 21  0 - 40 mg/dL   LDL Cholesterol 48  0 - 99 mg/dL   Comment:            Total Cholesterol/HDL:CHD Risk     Coronary Heart Disease Risk Table                         Men   Women      1/2 Average  Risk   3.4   3.3      Average Risk       5.0   4.4      2 X Average Risk   9.6   7.1      3 X Average Risk  23.4   11.0                Use the calculated Patient Ratio     above and the CHD Risk Table     to determine the patient's CHD Risk.                ATP III CLASSIFICATION (LDL):      <100     mg/dL   Optimal      161-096  mg/dL   Near or Above                        Optimal      130-159  mg/dL   Borderline      045-409  mg/dL   High      >811     mg/dL   Very High  BASIC METABOLIC PANEL     Status: Abnormal   Collection Time    10/15/12  6:30 AM      Result Value Range   Sodium 141  135 - 145 mEq/L   Potassium 3.7  3.5 - 5.1 mEq/L   Chloride 109  96 - 112 mEq/L   CO2 22  19 - 32 mEq/L   Glucose, Bld 93  70 - 99 mg/dL   BUN 14  6 - 23 mg/dL   Creatinine, Ser 9.14  0.50 - 1.35 mg/dL   Calcium 8.7  8.4 - 78.2 mg/dL   GFR calc non Af Amer 63 (*) >90 mL/min   GFR calc Af Amer 73 (*) >90 mL/min   Comment:            The eGFR has been calculated     using the CKD EPI equation.     This calculation has not been     validated in all clinical      situations.     eGFR's persistently     <90 mL/min signify     possible Chronic Kidney Disease.  CBC     Status: None   Collection Time    10/15/12  6:30 AM      Result Value Range   WBC 9.8  4.0 - 10.5 K/uL   RBC 4.53  4.22 - 5.81 MIL/uL   Hemoglobin 13.9  13.0 - 17.0 g/dL   HCT 95.6  21.3 - 08.6 %   MCV 91.4  78.0 - 100.0 fL   MCH 30.7  26.0 - 34.0 pg   MCHC 33.6  30.0 - 36.0 g/dL   RDW 57.8  46.9 - 62.9 %   Platelets 260  150 - 400 K/uL    Dg Chest Portable 1 View  10/14/2012  *RADIOLOGY REPORT*  Clinical Data: Chest pain  PORTABLE CHEST - 1 VIEW  Comparison: 03/11/2012 and prior chest radiographs  Findings: Upper limits normal heart size again noted. There is no evidence of focal airspace disease, pulmonary edema, suspicious pulmonary nodule/mass, pleural effusion, or pneumothorax. No acute bony abnormalities are identified.  IMPRESSION: No evidence of acute cardiopulmonary disease.   Original Report Authenticated By: Harmon Pier, M.D.     Review of Systems  Constitutional: Positive for malaise/fatigue. Negative for diaphoresis.  HENT: Negative for neck pain.   Respiratory: Negative for shortness of breath and wheezing.   Cardiovascular: Positive for chest pain. Negative for palpitations, orthopnea, claudication, leg swelling and PND.  Gastrointestinal: Positive for nausea. Negative for vomiting, abdominal pain, blood in stool and melena.  Genitourinary: Negative for flank pain.  Musculoskeletal: Negative for back pain.  Neurological: Positive for weakness. Negative for dizziness and loss of consciousness.   Blood pressure 116/70, pulse 57, temperature 97.9 F (36.6 C), resp. rate 16, height 5\' 10"  (1.778 m), weight 218 lb (98.884 kg), SpO2 96.00%. Physical Exam  Constitutional: He is oriented to person, place, and time. He appears well-developed and well-nourished. No distress.  HENT:  Head: Normocephalic and atraumatic.  Eyes: Conjunctivae and EOM are normal. Pupils are  equal, round, and reactive to light.  Neck: Normal range of motion. Neck supple. No JVD present. Carotid bruit is not present. No thyromegaly present.  Cardiovascular: Normal rate and regular rhythm.  Exam reveals no gallop and no friction rub.   No murmur heard. Pulses:      Radial pulses are 2+ on the right side, and 2+ on the left side.       Dorsalis pedis pulses are 2+ on the right side, and 2+ on the left side.  Respiratory: No respiratory distress. He has no wheezes. He has no rales.  GI: Soft. Bowel sounds are normal. He exhibits no distension and no mass. There is no tenderness.  Musculoskeletal: He exhibits no edema.  Lymphadenopathy:    He has no cervical adenopathy.  Neurological: He is alert and oriented to person, place, and time.  Skin: Skin is warm and dry. He is not diaphoretic.  Psychiatric: He has a normal mood and affect. His behavior is normal.    Assessment/Plan: Patient Active Problem List  Diagnosis  . PREMATURE VENTRICULAR CONTRACTIONS  . GERD  . Cancer of ascending colon  . Iron deficiency  . Hx of adenomatous colonic polyps  . Folic acid deficiency  . Peripheral neuropathy  . Chest pain  . ASCVD (arteriosclerotic cardiovascular disease)  . HTN (hypertension)   Plan: Currently CP free. Initial EKG showed TWI in V4 and V5. Troponin negative x 3. CXR normal. BP and HR both stable. Considering  pt's history of CAD, will likely need further work-up. NST vs diagnostic cath. MD to see and provide further recommendation.   Robbie Lis 10/15/2012, 9:40 AM     Agree with note written by Boyce Medici  PAC  Pt well known to me with CAD s/p LAD and RI PCI and Stent using DES 2 years ago. He is very active and exercises daily w/o symptoms. After shoveling snow yesterday he developed SSCP ultimately relieved with SL NTG X 2. No recurrent CP since 6PM yesterday. He went to [facility] yesterday and was transferred here and seen by [facility] (Appreciate the admission).  Exam benign. EKG with new ant TWI. Enz neg. Of note, he has been off of ASA for a month for ? GIB. Will take on our service and I have discussed with Dr. Lavera Guise. Marland Kitchen Restart ASA. Stress myoview tomorrow am. Hold BB.  Runell Gess 10/15/2012 10:50 AM

## 2012-10-15 NOTE — Progress Notes (Signed)
Utilization review completed.  

## 2012-10-16 ENCOUNTER — Observation Stay (HOSPITAL_COMMUNITY): Payer: Medicare Other

## 2012-10-16 MED ORDER — TECHNETIUM TC 99M SESTAMIBI GENERIC - CARDIOLITE
10.0000 | Freq: Once | INTRAVENOUS | Status: AC | PRN
  Administered 2012-10-16: 10 via INTRAVENOUS

## 2012-10-16 MED ORDER — ASPIRIN 325 MG PO TABS: 325.0000 mg | ORAL_TABLET | Freq: Every day | ORAL | Status: DC

## 2012-10-16 MED ORDER — TECHNETIUM TC 99M SESTAMIBI GENERIC - CARDIOLITE
30.0000 | Freq: Once | INTRAVENOUS | Status: AC | PRN
  Administered 2012-10-16: 30 via INTRAVENOUS

## 2012-10-16 NOTE — Progress Notes (Signed)
Pt. Seen and examined. Agree with the NP/PA-C note as written.  Chest pain free. Await final read on the NST - will review images. If negative, can be discharged home. Follow-up with midlevel in our office in 7-10 days.  Chrystie Nose, MD, [facility] Attending Cardiologist [facility]

## 2012-10-16 NOTE — Progress Notes (Signed)
[facility]  Subjective: CP resolved.  Objective: Vital signs in last 24 hours: Temp:  [98.4 F (36.9 C)-98.5 F (36.9 C)] 98.4 F (36.9 C) (02/17 0500) Pulse Rate:  [59-68] 68 (02/17 0852) Resp:  [16-20] 16 (02/17 0500) BP: (124-147)/(73-86) 129/86 mmHg (02/17 0852) SpO2:  [94 %-97 %] 94 % (02/17 0500) Weight:  [98.748 kg (217 lb 11.2 oz)] 98.748 kg (217 lb 11.2 oz) (02/17 0500) Last BM Date: 10/15/12  Intake/Output from previous day: 02/16 0701 - 02/17 0700 In: 702 [P.O.:702] Out: -  Intake/Output this shift:    Medications Current Facility-Administered Medications  Medication Dose Route Frequency Provider Last Rate Last Dose  . acetaminophen (TYLENOL) tablet 650 mg  650 mg Oral Q4H PRN Edsel Petrin, DO      . ALPRAZolam Prudy Feeler) tablet 0.25 mg  0.25 mg Oral TID PRN Edsel Petrin, DO      . aspirin tablet 325 mg  325 mg Oral Daily Brittainy Simmons, PA-C      . citalopram (CELEXA) tablet 10 mg  10 mg Oral q morning - 10a Edsel Petrin, DO   10 mg at 10/15/12 0915  . enoxaparin (LOVENOX) injection 95 mg  1 mg/kg Subcutaneous Q12H Edsel Petrin, DO   95 mg at 10/15/12 2038  . folic acid (FOLVITE) tablet 1 mg  1 mg Oral Weekly Edsel Petrin, DO   1 mg at 10/15/12 0915  . latanoprost (XALATAN) 0.005 % ophthalmic solution 1 drop  1 drop Both Eyes QHS Edsel Petrin, DO   1 drop at 10/15/12 2108  . losartan (COZAAR) tablet 50 mg  50 mg Oral q morning - 10a Edsel Petrin, DO   50 mg at 10/15/12 0915  . metoprolol (LOPRESSOR) tablet 50 mg  50 mg Oral BID Edsel Petrin, DO   50 mg at 10/15/12 2108  . multivitamin with minerals tablet 1 tablet  1 tablet Oral Daily Edsel Petrin, DO   1 tablet at 10/15/12 0915  . nitroGLYCERIN (NITROSTAT) SL tablet 0.4 mg  0.4 mg Sublingual Q5 Min x 3 PRN Edsel Petrin, DO      . ondansetron [facility]) injection 4 mg  4 mg Intravenous Q6H PRN Edsel Petrin, DO       . pantoprazole (PROTONIX) EC tablet 40 mg  40 mg Oral Daily Edsel Petrin, DO   40 mg at 10/15/12 0915  . simvastatin (ZOCOR) tablet 20 mg  20 mg Oral q1800 Edsel Petrin, DO   20 mg at 10/15/12 2108  . zolpidem (AMBIEN) tablet 5 mg  5 mg Oral QHS PRN Robbie Lis, PA-C        PE: General appearance: alert, cooperative and no distress Lungs: clear to auscultation bilaterally Heart: regular rate and rhythm, S1, S2 normal, no murmur, click, rub or gallop Extremities: Trace LEE Pulses: 2+ and symmetric Skin: Warm and dry Neurologic: Grossly normal  Lab Results:   Recent Labs  10/14/12 1742 10/14/12 2210 10/15/12 0630  WBC 9.7 10.1 9.8  HGB 14.6 14.1 13.9  HCT 43.1 41.7 41.4  PLT 319 287 260   BMET  Recent Labs  10/14/12 1742 10/14/12 2210 10/15/12 0630  NA 141  --  141  K 3.8  --  3.7  CL 107  --  109  CO2 24  --  22  GLUCOSE 110*  --  93  BUN 15  --  14  CREATININE 1.22  1.12 1.11  CALCIUM 9.2  --  8.7   PT/INR  Recent Labs  10/14/12 1742  LABPROT 12.6  INR 0.95   Cholesterol  Recent Labs  10/15/12 0630  CHOL 120   Cardiac Enzymes No components found with this basename: TROPONIN,  [facility],   Studies/Results: @RISRSLT2 @   Assessment/Plan    Principal Problem:   Chest pain Active Problems:   PREMATURE VENTRICULAR CONTRACTIONS   GERD   Cancer of ascending colon   Iron deficiency   Hx of adenomatous colonic polyps   Folic acid deficiency   Peripheral neuropathy   ASCVD (arteriosclerotic cardiovascular disease)   HTN (hypertension)  Plan:  Ruled out for MI.  Labs and BP stable.  The patient tolerated the exercise myoview well with no CP.  He did fatigue during stage three and the test was stopped at approx 50sec after injection.   LOS: 2 days    Mukesh Kornegay 10/16/2012 9:07 AM

## 2012-10-16 NOTE — Progress Notes (Signed)
Reviewed discharge instructions with patient and he states his understanding.  Patient stated PA instructed him to followup with Cardiology office in [facility] in the next 1-2 weeks.  Patient discharged via wheelchair home with family.  David Pugh

## 2012-10-18 NOTE — Discharge Summary (Signed)
Physician Discharge Summary  Patient ID: David Pugh. MRN: [id number] DOB/AGE: [birth year] 78 y.o.  Admit date: 10/14/2012 Discharge date: 10/16/2012  Admission Diagnoses: Chest Pain  Discharge Diagnoses:  Principal Problem:   Chest pain - ruled out for MI; negative Nuclear Stress Test 10/17/12 Active Problems:   ASCVD - S/P PCI & stenting to LAD and ramus branch w/ DES in 2012   PREMATURE VENTRICULAR CONTRACTIONS   GERD   Cancer of ascending colon   Iron deficiency   Hx of adenomatous colonic polyps   Folic acid deficiency   Peripheral neuropathy   HTN (hypertension)   Discharged Condition: stable  Hospital Course: The patient is a 78 y/o male with know CAD, S/P PCI and stenting to LAD and ramus branch with DES, by Dr. Allyson Sabal in 2012. He has been followed at [facility] by Dr. Royann Shivers.  His other medical history is significant for HTN, prostate cancer, colon cancer and persistent iron deficiency anemia. He had a right hemicolectomy in July of 2012.  He initially presented to [facility] on 2/15 with a complaint of exertional chest pain that was nitrate responsive. An EKG showed TWIs in V4 and V5, which were new from prior EKGs. He was then transferred to [facility] for further cardiac evaluation/ work-up. He ultimately ruled out for MI, with negative troponins x 3. However, due to the patient's history and new changes noted on EKG, it was decided to have him undergo nuclear stress testing. He was also placed back on ASA (325 mg), which had been temporarily discontinued by his oncologist, roughly one month ago for bleeding. The stress test was an exercise test, using the Bruce Protocol. He tolerated the test ok and had no reproducible chest pain. The final radiology report determined the test to be negative.  LVEF was estimated to be 52%. Mild septal hypokinesis was also noted, but was nonspecific. Both Dr. Allyson Sabal and Dr. Rennis Golden were notified of the test results. The patient had denied  further chest pain. He was last seen and examined by Dr. Rennis Golden, who felt that he was stable for discharge home. He was instructed to continue with high dose ASA until seen in follow-up. The patient lives in [place] and asked to have follow-up there. He will follow-up with Dr. Royann Shivers.   Consults: None  Significant Diagnostic Studies:   Nuclear Stress Test w/ LV Wall Motion Analysis and LV EF Calculation 10/17/12 Technique: Standard single day myocardial SPECT imaging was  performed after resting intravenous injection of Tc-4m sestamibi.  After performance of protocol treadmill stress under supervision of  cardiology staff, sestamibi was injected intravenously and standard  myocardial SPECT imaging was performed. Quantitative gated imaging  was also performed to evaluate left ventricular wall motion and  estimate left ventricular ejection fraction.   Radiopharmaceutical: 10+30 mCi Tc3m sestamibiIV.   Comparison: None available   Findings: The patient did achieve target heart rate. The stress  SPECT images demonstrate physiologic distribution of  radiopharmaceutical. Rest images demonstrate no perfusion defects.  The gated stress SPECT images demonstrate normal left ventricular  myocardial thickening with mild septal hypokinesis. Calculated  left ventricular end-diastolic volume , end-systolic volume  49ml, ejection fraction of 52%.   IMPRESSION:  1. Negative for exercise-stress induced ischemia.  2. Left ventricular ejection fraction 52%.  3. Mild septal hypokinesis, nonspecific.   Original Report Authenticated By: D. Andria Rhein, MD   Treatments: See Hospital Course  Discharge Exam: Blood pressure 130/80, pulse 63, temperature 98.1 F (  36.7 C), temperature source Oral, resp. rate 16, height 5\' 10"  (1.778 m), weight 217 lb 11.2 oz (98.748 kg), SpO2 96.00%.   Disposition: 01-Home or Self Care      Discharge Orders   Future Appointments Provider Department Dept  Phone   01/02/2013 10:00 AM [facility] [phone]   01/05/2013 10:00 AM Randall An, MD [facility] [phone]   Future Orders Complete By Expires     Diet - low sodium heart healthy  As directed     Increase activity slowly  As directed         Medication List    STOP taking these medications       aspirin EC 81 MG tablet      TAKE these medications       acetaminophen 650 MG CR tablet  Commonly known as:  TYLENOL  Take 1,300 mg by mouth 2 (two) times daily.     ALPRAZolam 0.25 MG tablet  Commonly known as:  XANAX  Take 0.25 mg by mouth at bedtime as needed. Anxiety.     aspirin 325 MG tablet  Take 1 tablet (325 mg total) by mouth daily.     B Complex-Folic Acid Caps  Take 1 capsule by mouth every morning.     citalopram 10 MG tablet  Commonly known as:  CELEXA  Take 10 mg by mouth every morning.     dexlansoprazole 60 MG capsule  Commonly known as:  DEXILANT  Take 60 mg by mouth daily.     folic acid 1 MG tablet  Commonly known as:  FOLVITE  Take 1 mg by mouth once a week.     losartan 50 MG tablet  Commonly known as:  COZAAR  Take 50 mg by mouth every morning.     metoprolol 50 MG tablet  Commonly known as:  LOPRESSOR  Take 50 mg by mouth 2 (two) times daily.     multivitamin with minerals Tabs  Take 1 tablet by mouth daily.     nitroGLYCERIN 0.4 MG SL tablet  Commonly known as:  NITROSTAT  Place 0.4 mg under the tongue every 5 (five) minutes x 3 doses as needed. For chest pain     pravastatin 40 MG tablet  Commonly known as:  PRAVACHOL  Take 40 mg by mouth at bedtime. Pt takes at bedtime     XALATAN 0.005 % ophthalmic solution  Generic drug:  latanoprost  Place 1 drop into both eyes at bedtime.       TIME SPENT ON DISCHARGE: 35 MINUTES  Signed: Allayne Butcher, PA-C  10/18/2012, 10:26 AM

## 2012-10-24 ENCOUNTER — Telehealth: Payer: Self-pay | Admitting: *Deleted

## 2012-10-24 NOTE — Telephone Encounter (Signed)
David Pugh called today. He is experiencing a lot of acid reflux. He made an appt with an extender, however he would like to see if Dr Jena Gauss can help him before this appt. Please advise. Thank you.

## 2012-10-24 NOTE — Telephone Encounter (Signed)
We can try a course of generic Zegerid 40 mg orally daily. Stop Dexilant. Dispense 30  Zegerid 40 mg capsules. 1 daily. No refills. Send him literature on GERD, please. Followup appointment with [place] as scheduled.

## 2012-10-24 NOTE — Telephone Encounter (Signed)
[facility]  Per [facility] chart he is currently on dexilant daily. He has taken, nexium, prilosec and protonix in the past.

## 2012-10-25 MED ORDER — OMEPRAZOLE-SODIUM BICARBONATE 40-1100 MG PO CAPS: 1.0000 | ORAL_CAPSULE | Freq: Every day | ORAL | Status: DC

## 2012-10-25 NOTE — Telephone Encounter (Signed)
Pt aware. rx sent to pharmacy. Pt is already scheduled for a follow up appt.

## 2012-10-25 NOTE — Addendum Note (Signed)
Addended by: Myra Rude on: 10/25/2012 08:51 AM   Modules accepted: Orders

## 2012-11-13 ENCOUNTER — Encounter: Payer: Self-pay | Admitting: Internal Medicine

## 2012-11-14 ENCOUNTER — Ambulatory Visit: Payer: Medicare Other | Admitting: Urgent Care

## 2012-11-14 ENCOUNTER — Encounter: Payer: Self-pay | Admitting: Gastroenterology

## 2012-11-14 ENCOUNTER — Ambulatory Visit (INDEPENDENT_AMBULATORY_CARE_PROVIDER_SITE_OTHER): Payer: Medicare Other | Admitting: Gastroenterology

## 2012-11-14 VITALS — BP 135/80 | HR 62 | Temp 97.9°F | Ht 71.0 in | Wt 225.6 lb

## 2012-11-14 DIAGNOSIS — K219 Gastro-esophageal reflux disease without esophagitis: Secondary | ICD-10-CM

## 2012-11-14 DIAGNOSIS — C182 Malignant neoplasm of ascending colon: Secondary | ICD-10-CM

## 2012-11-14 MED ORDER — OMEPRAZOLE-SODIUM BICARBONATE 40-1100 MG PO CAPS: 1.0000 | ORAL_CAPSULE | Freq: Every day | ORAL | Status: DC

## 2012-11-14 NOTE — Progress Notes (Signed)
Referring Provider: Louie Boston., MD Primary Care Physician:  Louie Boston, MD Primary Gastroenterologist: Dr. Jena Gauss   Chief Complaint  Patient presents with  . Gastrophageal Reflux    HPI:   David Pugh is a pleasant 78 year old male with a history of colon cancer diagnosed at time of colonoscopy in May 2013. Lesion was in base of cecum. He also had fragment of tubular adenoma with focal high grade dysplasia in the descending colon. Prep was adequate. He also had changes of radiation-induced proctitis but given he was asymptomatic no treatment was offered. Patient underwent laparoscopic assisted partial colectomy (right colectomy) 02/29/12 by Dr. Claud Kelp. He was found to be Stage III.  Surveillance colonoscopy completed by Dr. Jena Gauss Dec 2013. This showed radiation proctitis, colonic diverticulosis, ulcerations at surgical anastomosis, s/p biopsy. Path with ulcerated colonic mucosa with prolapse changes, tubular adenoma fragment. He is due for surveillance colonoscopy in December 2014.  He also has a history of GERD, and he has tried and failed Nexium, Prilosec, and Protonix in the past. He was recently switched from Dexilant to Zegerid 40 mg daily. Up about 10 lbs from Dec 2013.  States he felt like acid was just stuck mid-esophagus when waking up in the morning. Now he notes improvement. Eats about 6-7 and goes to bed at 10. No dysphagia. No abdominal pain. No N/V. Noticed improvement with [employer]. No overt GI bleeding.   Past Medical History  Diagnosis Date  . Premature ventricular contraction   . Prostate cancer 2010  . 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 Radiation Feb 2011    treatment  . Biliary dyskinesia   . GERD (gastroesophageal reflux disease)   . Hiatal hernia 2010    tiny  . HTN (hypertension)   . Rosacea   . PTSD (post-traumatic stress disorder)   . Angina pectoris, unstable   . Diverticula of colon 2010  . Colon polyps     tubular adenomas   . Arthritis   . Exposure to [employer]   . CAD (coronary artery disease)     hx of 2 stents   . Peripheral neuropathy   . Anemia     hx of anemia as a child   . Colon cancer 12/2011    s/p right hemicolectomy, did not tolerate chemo  . Iron deficiency 05/02/2012    followed by Dr. Mariel Sleet, iron infusions.   . Folic acid deficiency     Past Surgical History  Procedure Laterality Date  . Cholecystectomy    . Prostatectomy    . Tonsillectomy    . Transurethral resection of prostate    . Esophagogastroduodenoscopy  10/10/2008    Dr. Maceo Pro hiatal hernia, normal esophagus, normal stomach  . Colonoscopy  02/12/2004    Dr. Jena Gauss- L side diverticula, inflammatory  colon polyps  . Esophagogastroduodenoscopy  02/12/2004    Dr. Geni Bers- normal   . Carotid stent  02/14/11  . Coronary stent placement  01/2011  . 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 Multiple colonic  polyps removed as described above. Sigmoid Diverticulosis/ 1.5 x 2 cm relatively flat ulcerated lesion in cecum/path showed adenocarcinoma of the colon. descending colon polyp with tubular adenoma and one fragment of focal high grade dysplasia.   . Knee surgery      left knee   . Partial colectomy  02/29/2012    Procedure: PARTIAL COLECTOMY;  Surgeon: Mikey Bussing  Grayce Sessions, MD;  Location: [facility] ORS;  Service: General;  Laterality: Right;  . Vasectomy    . Cardiac catheterization  02/13/2011    [facility] cardiology  . Colonoscopy  08/28/2012    [id number] proctitis. Colonic diverticulosis/Ulcerations at the surgical anastomosis  path: ulcerated colonic mucosa with prolapse changes, tubular adenoma.     Current Outpatient Prescriptions  Medication Sig Dispense Refill  . acetaminophen (TYLENOL) 650 MG CR tablet Take 1,300 mg by mouth 2 (two) times daily.      Marland Kitchen ALPRAZolam (XANAX) 0.25 MG tablet Take 0.25 mg by mouth at bedtime as needed.  Anxiety.      Marland Kitchen aspirin 325 MG tablet Take 1 tablet (325 mg total) by mouth daily.      . citalopram (CELEXA) 10 MG tablet Take 10 mg by mouth every morning.       . folic acid (FOLVITE) 1 MG tablet Take 1 mg by mouth once a week.      . latanoprost (XALATAN) 0.005 % ophthalmic solution Place 1 drop into both eyes at bedtime.       Marland Kitchen losartan (COZAAR) 50 MG tablet Take 50 mg by mouth every morning.       . metoprolol (LOPRESSOR) 50 MG tablet Take 50 mg by mouth 2 (two) times daily.       . Multiple Vitamin (MULTIVITAMIN WITH MINERALS) TABS Take 1 tablet by mouth daily.      . nitroGLYCERIN (NITROSTAT) 0.4 MG SL tablet Place 0.4 mg under the tongue every 5 (five) minutes x 3 doses as needed. For chest pain      . omeprazole-sodium bicarbonate (ZEGERID) 40-1100 MG per capsule Take 1 capsule by mouth daily before breakfast.  30 capsule  0  . pravastatin (PRAVACHOL) 40 MG tablet Take 40 mg by mouth at bedtime. Pt takes at bedtime       No current facility-administered medications for this visit.    Allergies as of 11/14/2012 - Review Complete 11/14/2012  Allergen Reaction Noted  . Shellfish allergy Anaphylaxis 01/13/2012  . Sulfonamide derivatives Diarrhea 04/30/2009    Family History  Problem Relation Age of Onset  . Throat cancer Father   . Cancer Father     Throat  . Heart attack    . Prostate cancer Brother   . Cancer Brother     Prostate and Skin  . Colon cancer Neg Hx     History   Social History  . Marital Status: Married    Spouse Name: N/A    Number of Children: N/A  . Years of Education: N/A   Occupational History  . Retired    Social History Main Topics  . Smoking status: Never Smoker   . Smokeless tobacco: Never Used  . Alcohol Use: Yes     Comment: 1 cocktail/week  . Drug Use: No  . Sexually Active: Yes    Birth Control/ Protection: None   Other Topics Concern  . None   Social History Narrative  . None    Review of Systems: Negative unless  mentioned in HPI  Physical Exam: BP 135/80  Pulse 62  Temp(Src) 97.9 F (36.6 C) (Oral)  Ht 5\' 11"  (1.803 m)  Wt 225 lb 9.6 oz (102.331 kg)  BMI 31.48 kg/m2 General:   Alert and oriented. No distress noted. Pleasant and cooperative.  Head:  Normocephalic and atraumatic. Eyes:  Conjuctiva clear without scleral icterus. Mouth:  Oral mucosa pink and moist. Good dentition. No lesions. Heart:  S1, S2 present without murmurs, rubs, or gallops. Regular rate and rhythm. Abdomen:  +BS, soft, non-tender and non-distended. No rebound or guarding. No HSM or masses noted. Msk:  Symmetrical without gross deformities. Normal posture. Extremities:  Without edema. Neurologic:  Alert and  oriented x4;  grossly normal neurologically. Skin:  Intact without significant lesions or rashes. Psych:  Alert and cooperative. Normal mood and affect.

## 2012-11-14 NOTE — Assessment & Plan Note (Signed)
Tried and failed Protonix, Prilosec, and Nexium. Doing well with Zegerid now. Refills provided. Informed patient he may take Zantac or Pepcid as needed in the evenings; however, he seems to be doing well with Zegerid once a day. GERD literature provided. Return in nov 2014.

## 2012-11-14 NOTE — Assessment & Plan Note (Signed)
Diagnosed in May 2013, s/p right colectomy in July 2013. Stage 3. Followed by Dr. Mariel Sleet. Currently doing well. Due for surveillance colonoscopy in December 2014. Return in Nov 2014 to schedule. Discussed signs and symptoms that would warrant further evaluation. Patient well-versed in this. Continue probiotic daily.

## 2012-11-14 NOTE — Patient Instructions (Addendum)
Continue taking Zegerid each morning.  We will see you back in Nov 2014 to set up your colonoscopy. Please call us if you have any changes in your bowel habits, abdominal pain, rectal bleeding, unintentional weight loss, or anything abnormal.  Diet for Gastroesophageal Reflux Disease, Adult Reflux (acid reflux) is when acid from your stomach flows up into the esophagus. When acid comes in contact with the esophagus, the acid causes irritation and soreness (inflammation) in the esophagus. When reflux happens often or so severely that it causes damage to the esophagus, it is called gastroesophageal reflux disease (GERD). Nutrition therapy can help ease the discomfort of GERD. FOODS OR DRINKS TO AVOID OR LIMIT  Smoking or chewing tobacco. Nicotine is one of the most potent stimulants to acid production in the gastrointestinal tract.  Caffeinated and decaffeinated coffee and black tea.  Regular or low-calorie carbonated beverages or energy drinks (caffeine-free carbonated beverages are allowed).   Strong spices, such as black pepper, white pepper, red pepper, cayenne, curry powder, and chili powder.  Peppermint or spearmint.  Chocolate.  High-fat foods, including meats and fried foods. Extra added fats including oils, butter, salad dressings, and nuts. Limit these to less than 8 tsp per day.  Fruits and vegetables if they are not tolerated, such as citrus fruits or tomatoes.  Alcohol.  Any food that seems to aggravate your condition. If you have questions regarding your diet, call your caregiver or a registered dietitian. OTHER THINGS THAT MAY HELP GERD INCLUDE:   Eating your meals slowly, in a relaxed setting.  Eating 5 to 6 small meals per day instead of 3 large meals.  Eliminating food for a period of time if it causes distress.  Not lying down until 3 hours after eating a meal.  Keeping the head of your bed raised 6 to 9 inches (15 to 23 cm) by using a foam wedge or blocks  under the legs of the bed. Lying flat may make symptoms worse.  Being physically active. Weight loss may be helpful in reducing reflux in overweight or obese adults.  Wear loose fitting clothing EXAMPLE MEAL PLAN This meal plan is approximately 2,000 calories based on https://www.bernard.org/ meal planning guidelines. Breakfast   cup cooked oatmeal.  1 cup strawberries.  1 cup low-fat milk.  1 oz almonds. Snack  1 cup cucumber slices.  6 oz yogurt (made from low-fat or fat-free milk). Lunch  2 slice whole-wheat bread.  2 oz sliced [place].  2 tsp mayonnaise.  1 cup blueberries.  1 cup snap peas. Snack  6 whole-wheat crackers.  1 oz string cheese. Dinner   cup brown rice.  1 cup mixed veggies.  1 tsp olive oil.  3 oz grilled fish. Document Released: 08/16/2005 Document Revised: 11/08/2011 Document Reviewed: 07/02/2011 [facility] Patient Information 2013 [place], [place].

## 2012-11-15 NOTE — Progress Notes (Signed)
Faxed to PCP

## 2012-11-22 ENCOUNTER — Telehealth (HOSPITAL_COMMUNITY): Payer: Self-pay

## 2012-11-22 ENCOUNTER — Other Ambulatory Visit (HOSPITAL_COMMUNITY): Payer: Self-pay | Admitting: *Deleted

## 2012-11-22 DIAGNOSIS — E611 Iron deficiency: Secondary | ICD-10-CM

## 2012-11-22 NOTE — Telephone Encounter (Signed)
Call from David Pugh with complaints of increasing fatigue.  Had colonoscopy in December and was taken off Aspirin due to some changes in bowel due to radiation.  In February had episode of angina and had to be placed back on aspirin.  Is concerned that he is losing blood through his bowels and wonders if his iron level is getting low again?  Not scheduled for blood work and follow-up here until 01/05/13.

## 2012-11-23 ENCOUNTER — Other Ambulatory Visit: Payer: Self-pay | Admitting: Internal Medicine

## 2012-11-27 ENCOUNTER — Encounter (HOSPITAL_COMMUNITY): Payer: Medicare Other | Attending: Oncology

## 2012-11-27 DIAGNOSIS — D509 Iron deficiency anemia, unspecified: Secondary | ICD-10-CM | POA: Insufficient documentation

## 2012-11-27 DIAGNOSIS — E611 Iron deficiency: Secondary | ICD-10-CM

## 2012-11-27 LAB — CBC
MCH: 31.4 pg (ref 26.0–34.0)
Platelets: 275 10*3/uL (ref 150–400)
RBC: 4.87 MIL/uL (ref 4.22–5.81)

## 2012-11-27 LAB — FERRITIN: Ferritin: 53 ng/mL (ref 22–322)

## 2012-11-27 NOTE — Progress Notes (Signed)
Labs drawn today for cbc,ferr 

## 2013-01-02 ENCOUNTER — Encounter (HOSPITAL_COMMUNITY): Payer: Medicare Other | Attending: Oncology

## 2013-01-02 DIAGNOSIS — E611 Iron deficiency: Secondary | ICD-10-CM

## 2013-01-02 DIAGNOSIS — D649 Anemia, unspecified: Secondary | ICD-10-CM

## 2013-01-02 DIAGNOSIS — C182 Malignant neoplasm of ascending colon: Secondary | ICD-10-CM

## 2013-01-02 DIAGNOSIS — C189 Malignant neoplasm of colon, unspecified: Secondary | ICD-10-CM

## 2013-01-02 DIAGNOSIS — E538 Deficiency of other specified B group vitamins: Secondary | ICD-10-CM

## 2013-01-02 DIAGNOSIS — D509 Iron deficiency anemia, unspecified: Secondary | ICD-10-CM

## 2013-01-02 LAB — CBC WITH DIFFERENTIAL/PLATELET
Basophils Absolute: 0.1 10*3/uL (ref 0.0–0.1)
Basophils Relative: 1 % (ref 0–1)
Eosinophils Absolute: 0.9 10*3/uL — ABNORMAL HIGH (ref 0.0–0.7)
Lymphs Abs: 3.6 10*3/uL (ref 0.7–4.0)
MCH: 31.3 pg (ref 26.0–34.0)
MCHC: 33.9 g/dL (ref 30.0–36.0)
Neutrophils Relative %: 51 % (ref 43–77)
Platelets: 321 10*3/uL (ref 150–400)
RBC: 4.69 MIL/uL (ref 4.22–5.81)

## 2013-01-02 NOTE — Progress Notes (Signed)
Labs drawn today for cea,cbc/diff,Iron and IBC,ferr,folate

## 2013-01-03 ENCOUNTER — Encounter: Payer: Self-pay | Admitting: Cardiovascular Disease

## 2013-01-03 LAB — IRON AND TIBC: Iron: 101 ug/dL (ref 42–135)

## 2013-01-05 ENCOUNTER — Encounter (HOSPITAL_COMMUNITY): Payer: Self-pay | Admitting: Oncology

## 2013-01-05 ENCOUNTER — Encounter (HOSPITAL_BASED_OUTPATIENT_CLINIC_OR_DEPARTMENT_OTHER): Payer: Medicare Other | Admitting: Oncology

## 2013-01-05 VITALS — BP 143/76 | HR 63 | Temp 98.1°F | Resp 18 | Wt 222.8 lb

## 2013-01-05 DIAGNOSIS — D509 Iron deficiency anemia, unspecified: Secondary | ICD-10-CM

## 2013-01-05 DIAGNOSIS — C182 Malignant neoplasm of ascending colon: Secondary | ICD-10-CM

## 2013-01-05 DIAGNOSIS — G609 Hereditary and idiopathic neuropathy, unspecified: Secondary | ICD-10-CM

## 2013-01-05 DIAGNOSIS — E611 Iron deficiency: Secondary | ICD-10-CM

## 2013-01-05 NOTE — Patient Instructions (Addendum)
[facility] Discharge Instructions  RECOMMENDATIONS MADE BY THE CONSULTANT AND ANY TEST RESULTS WILL BE SENT TO YOUR REFERRING PHYSICIAN.  EXAM FINDINGS BY THE PHYSICIAN TODAY AND SIGNS OR SYMPTOMS TO REPORT TO CLINIC OR PRIMARY PHYSICIAN: Exam and discussion by PA.  You are doing well.  Blood work is stable.  MEDICATIONS PRESCRIBED:  none  INSTRUCTIONS GIVEN AND DISCUSSED: Report changes in bowel habits, blood in your bowel movement or other problems.  SPECIAL INSTRUCTIONS/FOLLOW-UP: Blood work and CT scans of abdomen and pelvis in July.  Blood work every 3 months and to see Dr. Mariel Sleet after blood work in October.  Thank you for choosing [facility] to provide your oncology and hematology care.  To afford each patient quality time with our providers, please arrive at least 15 minutes before your scheduled appointment time.  With your help, our goal is to use those 15 minutes to complete the necessary work-up to ensure our physicians have the information they need to help with your evaluation and healthcare recommendations.    Effective January 1st, 2014, we ask that you re-schedule your appointment with our physicians should you arrive 10 or more minutes late for your appointment.  We strive to give you quality time with our providers, and arriving late affects you and other patients whose appointments are after yours.    Again, thank you for choosing [facility].  Our hope is that these requests will decrease the amount of time that you wait before being seen by our physicians.       _____________________________________________________________  Should you have questions after your visit to [facility], please contact our office at [phone] between the hours of 8:30 a.m. and 5:00 p.m.  Voicemails left after 4:30 p.m. will not be returned until the following business day.  For prescription refill requests, have your  pharmacy contact our office with your prescription refill request.

## 2013-01-05 NOTE — Progress Notes (Signed)
David Boston, MD [address] David Pugh [place] [address]  Cancer of ascending colon - Plan: CBC with Differential, Basic metabolic panel, CBC with Differential, Comprehensive metabolic panel, Iron and TIBC, Ferritin, Folate, Ferritin, CT Abdomen Pelvis W Contrast  Iron deficiency - Plan: CBC with Differential, Basic metabolic panel, CBC with Differential, Comprehensive metabolic panel, Iron and TIBC, Ferritin, Folate, Ferritin  CURRENT THERAPY: Surveillance per NCCN guidelines with CEA every 3 months and annual CT abd/pelvis with contrast.  INTERVAL HISTORY: David Pugh 78 y.o. male returns for  regular  visit for followup of Stage III cancer of the colon S/P surgical resection with him tolerance to capecitabine which had to be discontinued AND iron deficiency anemia.   David Pugh is doing well.  He denies any complaints.  Oncologically, he denies any complaints and ROS questioning is negative.   I personally reviewed and went over laboratory results with the patient.  We spent time discussing his lab work.  His iron studies are solid and WNL.  His CBC is unremarkable.  His Folate is > 20.0.  He does have a mild leukocytosis with predominance of eosinophils.   We spent time discussing future surveillance per NCCN guidelines.  We will perform CEA every 3 months with annual CT abd/pelvis.    Past Medical History  Diagnosis Date  . Premature ventricular contraction   . Prostate cancer 2010  . 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 Radiation Feb 2011    treatment  . Biliary dyskinesia   . GERD (gastroesophageal reflux disease)   . Hiatal hernia 2010    tiny  . HTN (hypertension)   . Rosacea   . PTSD (post-traumatic stress disorder)   . Angina pectoris, unstable   . Diverticula of colon 2010  . Colon polyps     tubular adenomas  . Arthritis   . Exposure to [employer]   . CAD (coronary artery disease)     hx of 2 stents   . Peripheral neuropathy   . Anemia     hx of  anemia as a child   . Colon cancer 12/2011    s/p right hemicolectomy, did not tolerate chemo  . Iron deficiency 05/02/2012    followed by Dr. Mariel Sleet, iron infusions.   . Folic acid deficiency   . Heart palpitations 10/2012    has PREMATURE VENTRICULAR CONTRACTIONS; GERD; Cancer of ascending colon; Iron deficiency; Hx of adenomatous colonic polyps; Folic acid deficiency; Peripheral neuropathy; Chest pain - ruled out for MI; negative Nuclear Stress Test 10/17/12; ASCVD - S/P PCI & stenting to LAD and ramus branch w/ DES in 2012; and HTN (hypertension) on his problem list.     is allergic to shellfish allergy; sulfa antibiotics; and sulfonamide derivatives.  David Pugh does not currently have medications on file.  Past Surgical History  Procedure Laterality Date  . Cholecystectomy    . Prostatectomy    . Tonsillectomy    . Transurethral resection of prostate    . Esophagogastroduodenoscopy  10/10/2008    Dr. Maceo Pro hiatal hernia, normal esophagus, normal stomach  . Colonoscopy  02/12/2004    Dr. Jena Gauss- L side diverticula, inflammatory  colon polyps  . Esophagogastroduodenoscopy  02/12/2004    Dr. Geni Bers- normal   . Carotid stent  02/14/11  . Coronary stent placement  01/2011    2.5x90mm Xience drug-eluting stent in ramus intermedius/OMI vessel, 2.5x98mm Promus Element stent in mid LAD artery  . 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 Multiple colonic  polyps removed as described above. Sigmoid Diverticulosis/ 1.5 x 2 cm relatively flat ulcerated lesion in cecum/path showed adenocarcinoma of the colon. descending colon polyp with tubular adenoma and one fragment of focal high grade dysplasia.   . Knee surgery      left knee   . Partial colectomy  02/29/2012    Procedure: PARTIAL COLECTOMY;  Surgeon: Ernestene Mention, MD;  Location: [facility];  Service: General;  Laterality: Right;  . Vasectomy    . Cardiac  catheterization  02/13/2011    [facility], [facility] cardiology  . Colonoscopy  08/28/2012    WUJ:[id number] proctitis. Colonic diverticulosis/Ulcerations at the surgical anastomosis  path: ulcerated colonic mucosa with prolapse changes, tubular adenoma.     Denies any headaches, dizziness, double vision, fevers, chills, night sweats, nausea, vomiting, diarrhea, constipation, chest pain, heart palpitations, shortness of breath, blood in stool, black tarry stool, urinary pain, urinary burning, urinary frequency, hematuria.   PHYSICAL EXAMINATION  ECOG PERFORMANCE STATUS: 0 - Asymptomatic  Filed Vitals:   01/05/13 1000  BP: 143/76  Pulse: 63  Temp: 98.1 F (36.7 C)  Resp: 18    GENERAL:alert, no distress, well nourished, well developed, comfortable, cooperative and smiling SKIN: skin color, texture, turgor are normal, no rashes or significant lesions HEAD: Normocephalic, No masses, lesions, tenderness or abnormalities EYES: normal, Conjunctiva are pink and non-injected EARS: External ears normal OROPHARYNX:mucous membranes are moist  NECK: supple, no adenopathy, thyroid normal size, non-tender, without nodularity, no stridor, non-tender, trachea midline LYMPH:  no palpable lymphadenopathy, no hepatosplenomegaly BREAST:breasts appear normal, no suspicious masses, no skin or nipple changes or axillary nodes LUNGS: clear to auscultation and percussion HEART: regular rate & rhythm, no murmurs, no gallops, S1 normal and S2 normal ABDOMEN:abdomen soft, non-tender, normal bowel sounds, no masses or organomegaly and no hepatosplenomegaly. Midline abdominal incision scar noted. BACK: Back symmetric, no curvature., No CVA tenderness EXTREMITIES:less then 2 second capillary refill, no joint deformities, effusion, or inflammation, no edema, no skin discoloration, no clubbing, no cyanosis  NEURO: alert & oriented x 3 with fluent speech, no focal motor/sensory deficits, gait normal   LABORATORY  DATA: CBC    Component Value Date/Time   WBC 10.9* 01/02/2013 0948   RBC 4.69 01/02/2013 0948   HGB 14.7 01/02/2013 0948   HCT 43.3 01/02/2013 0948   PLT 321 01/02/2013 0948   MCV 92.3 01/02/2013 0948   MCH 31.3 01/02/2013 0948   MCHC 33.9 01/02/2013 0948   RDW 13.1 01/02/2013 0948   LYMPHSABS 3.6 01/02/2013 0948   MONOABS 0.9 01/02/2013 0948   EOSABS 0.9* 01/02/2013 0948   BASOSABS 0.1 01/02/2013 0948    Lab Results  Component Value Date   IRON 101 01/02/2013   TIBC 273 01/02/2013   FERRITIN 60 01/02/2013   Lab Results  Component Value Date   FOLATE >20.0 01/02/2013     ASSESSMENT:  1. Stage III cancer the colon status post surgical resection with intolerance to capecitabine which had to be discontinued.  2. Extensive peripheral neuropathy, grade 2, at least.  3. Iron deficiency anemia at presentation. S/P feraheme infusion on 04/19/2012 and 07/20/2012 . I suspect he consumed all of his iron utilizing it for his red cell production which is excellent presently.  4. Severe folic acid deficiency at presentation now with normal levels.  Patient Active Problem List   Diagnosis Date Noted  . Peripheral neuropathy  10/14/2012  . Chest pain - ruled out for MI; negative Nuclear Stress Test 10/17/12 10/14/2012  . ASCVD - S/P PCI & stenting to LAD and ramus branch w/ DES in 2012 10/14/2012  . HTN (hypertension) 10/14/2012  . Hx of adenomatous colonic polyps 07/26/2012  . Folic acid deficiency 07/26/2012  . Iron deficiency 05/02/2012  . Cancer of ascending colon 03/29/2012  . GERD 03/09/2010  . PREMATURE VENTRICULAR CONTRACTIONS 04/30/2009      PLAN:  1. I personally reviewed and went over laboratory results with the patient. 2. Discussion regarding future surveillance per NCCN guidelines.  3. CEA every 3 months 4. CT abd/pelvis with contrast in July and then annually.  5. Surveillance colonoscopy due in December 2014 6. Labs in July prior to CT scan: CBC diff, BMET, Ferritin, CEA 7. Labs in 5 months:  CBC diff, CMET, Iron/TIBC, Ferritin, CEA 8. Return in 5 months for follow-up.  All questions were answered. The patient knows to call the clinic with any problems, questions or concerns. We can certainly see the patient much sooner if necessary.  Patient and plan will be discussed with Dr. Mariel Sleet within the next 24 hours.    KEFALAS,THOMAS

## 2013-02-07 ENCOUNTER — Ambulatory Visit (INDEPENDENT_AMBULATORY_CARE_PROVIDER_SITE_OTHER): Payer: Medicare Other | Admitting: Cardiovascular Disease

## 2013-02-07 ENCOUNTER — Encounter: Payer: Self-pay | Admitting: Cardiovascular Disease

## 2013-02-07 ENCOUNTER — Ambulatory Visit: Payer: Medicare Other | Admitting: Cardiovascular Disease

## 2013-02-07 VITALS — BP 136/82 | HR 60 | Resp 14 | Ht 71.0 in | Wt 227.8 lb

## 2013-02-07 DIAGNOSIS — I499 Cardiac arrhythmia, unspecified: Secondary | ICD-10-CM

## 2013-02-07 DIAGNOSIS — E785 Hyperlipidemia, unspecified: Secondary | ICD-10-CM

## 2013-02-07 DIAGNOSIS — I10 Essential (primary) hypertension: Secondary | ICD-10-CM

## 2013-02-07 DIAGNOSIS — I7090 Unspecified atherosclerosis: Secondary | ICD-10-CM

## 2013-02-07 DIAGNOSIS — I2510 Atherosclerotic heart disease of native coronary artery without angina pectoris: Secondary | ICD-10-CM

## 2013-02-07 NOTE — Patient Instructions (Signed)
Your physician has recommended you make the following change in your medication: take losartan 25 mg (half a tablet) twice a day Reduce intake of sweets, try to lose at least 12 lb by next year's visit.

## 2013-02-07 NOTE — Progress Notes (Signed)
Patient ID: David Pugh, male   DOB: 08-02-1935, 78 y.o.   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 He did remarkably well in cardiac rehabilitation but not long afterwards was diagnosed with colon cancer. He underwent curative resection. Was found to have 2 positive lymph nodes. He could not tolerate chemotherapy. He still considered to be in remission.  Now feels quite well. He had lost weight during his lites with a colon cancer but has more than recovered. His waistline has extended back up to 38 inches. His wife states that he has weakness for desserts and baked goods. He has gained this weight despite the fact that he exercises everyday for 1 hour. Denies any problems with chest pain or shortness of breath with activity.  He has noticed that his blood pressure is high at per morning when he wakes up but shortly after he takes his medications he comes back to the desirable range. Blood pressure is consistently normal after he exercises    Allergies  Allergen Reactions  . Shellfish Allergy Anaphylaxis  . Sulfa Antibiotics   . Sulfonamide Derivatives Diarrhea    Current Outpatient Prescriptions  Medication Sig Dispense Refill  . acetaminophen (TYLENOL) 650 MG CR tablet Take 1,300 mg by mouth 2 (two) times daily as needed.       Marland Kitchen aspirin 325 MG tablet Take 1 tablet (325 mg total) by mouth daily.      . citalopram (CELEXA) 10 MG tablet Take 10 mg by mouth every morning.       . folic acid (FOLVITE) 1 MG tablet Take 1 mg by mouth once a week.      . latanoprost (XALATAN) 0.005 % ophthalmic solution Place 1 drop into both eyes at bedtime.       Marland Kitchen losartan (COZAAR) 50 MG tablet Take 50 mg by mouth every morning.       . metoprolol (LOPRESSOR) 50 MG tablet Take 50 mg by  mouth 2 (two) times daily.       . Multiple Vitamin (MULTIVITAMIN WITH MINERALS) TABS Take 1 tablet by mouth daily.      . nitroGLYCERIN (NITROSTAT) 0.4 MG SL tablet Place 0.4 mg under the tongue every 5 (five) minutes x 3 doses as needed. For chest pain      . omeprazole-sodium bicarbonate (ZEGERID) 40-1100 MG per capsule Take 1 capsule by mouth daily before breakfast.  30 capsule  11  . pravastatin (PRAVACHOL) 40 MG tablet Take 40 mg by mouth at bedtime. Pt takes at bedtime      . ALPRAZolam (XANAX) 0.25 MG tablet Take 0.25 mg by mouth at bedtime as needed. Anxiety.       No current facility-administered medications for this visit.    Past Medical History  Diagnosis Date  . Premature ventricular contraction   . Prostate cancer 2010  . 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 Radiation Feb 2011    treatment  . Biliary dyskinesia   . GERD (gastroesophageal reflux disease)   . Hiatal hernia 2010    tiny  . HTN (hypertension)   . Rosacea   . PTSD (post-traumatic stress disorder)   . Angina pectoris, unstable   . Diverticula of colon 2010  . Colon polyps  tubular adenomas  . Arthritis   . Exposure to [employer]   . CAD (coronary artery disease)     hx of 2 stents   . Peripheral neuropathy   . Anemia     hx of anemia as a child   . Colon cancer 12/2011    s/p right hemicolectomy, did not tolerate chemo  . Iron deficiency 05/02/2012    followed by Dr. Mariel Sleet, iron infusions.   . Folic acid deficiency   . Heart palpitations 10/2012    Past Surgical History  Procedure Laterality Date  . Cholecystectomy    . Prostatectomy    . Tonsillectomy    . Transurethral resection of prostate    . Esophagogastroduodenoscopy  10/10/2008    Dr. Maceo Pro hiatal hernia, normal esophagus, normal stomach  . Colonoscopy  02/12/2004    Dr. Jena Gauss- L side diverticula, inflammatory  colon polyps  . Esophagogastroduodenoscopy  02/12/2004    Dr. Geni Bers- normal   . Carotid stent  02/14/11  .  Coronary stent placement  01/2011    2.5x30mm Xience drug-eluting stent in ramus intermedius/OMI vessel, 2.5x46mm Promus Element stent in mid LAD artery  . 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 Multiple colonic  polyps removed as described above. Sigmoid Diverticulosis/ 1.5 x 2 cm relatively flat ulcerated lesion in cecum/path showed adenocarcinoma of the colon. descending colon polyp with tubular adenoma and one fragment of focal high grade dysplasia.   . Knee surgery      left knee   . Partial colectomy  02/29/2012    Procedure: PARTIAL COLECTOMY;  Surgeon: Ernestene Mention, MD;  Location: [facility];  Service: General;  Laterality: Right;  . Vasectomy    . Cardiac catheterization  02/13/2011    [facility], [facility] cardiology  . Colonoscopy  08/28/2012    [id number] proctitis. Colonic diverticulosis/Ulcerations at the surgical anastomosis  path: ulcerated colonic mucosa with prolapse changes, tubular adenoma.     Family History  Problem Relation Age of Onset  . Throat cancer Father   . Cancer Father     Throat  . Prostate cancer Brother   . Cancer Brother     Prostate and Skin  . Colon cancer Neg Hx     History   Social History  . Marital Status: Married    Spouse Name: N/A    Number of Children: 3  . Years of Education: N/A   Occupational History  . Retired   .     Social History Main Topics  . Smoking status: Never Smoker   . Smokeless tobacco: Never Used  . Alcohol Use: Yes     Comment: 1 cocktail/month  . Drug Use: No  . Sexually Active: Yes    Birth Control/ Protection: None   Other Topics Concern  . Not on file   Social History Narrative  . No narrative on file    Review of systems: The patient specifically denies any chest pain at rest or with exertion, dyspnea at rest or with exertion, orthopnea, paroxysmal nocturnal dyspnea, syncope, palpitations, focal neurological  deficits, intermittent claudication, lower extremity edema, unexplained weight gain, cough, hemoptysis or wheezing.  The patient also denies abdominal pain, nausea, vomiting, dysphagia, diarrhea, constipation, polyuria, polydipsia, dysuria, hematuria, frequency, urgency, abnormal bleeding or bruising, fever, chills, unexpected weight changes, mood swings, change in skin or hair texture, change in voice quality, auditory or visual problems, allergic reactions or rashes, new musculoskeletal  complaints other than usual "aches and pains".   PHYSICAL EXAM BP 136/82  Pulse 60  Resp 14  Ht 5\' 11"  (1.803 m)  Wt 227 lb 12.8 oz (103.329 kg)  BMI 31.79 kg/m2  General: Alert, oriented x3, no distress Head: no evidence of trauma, PERRL, EOMI, no exophtalmos or lid lag, no myxedema, no xanthelasma; normal ears, nose and oropharynx Neck: normal jugular venous pulsations and no hepatojugular reflux; brisk carotid pulses without delay and no carotid bruits Chest: clear to auscultation, no signs of consolidation by percussion or palpation, normal fremitus, symmetrical and full respiratory excursions Cardiovascular: normal position and quality of the apical impulse, regular rhythm, normal first and second heart sounds, no murmurs, rubs or gallops Abdomen: no tenderness or distention, no masses by palpation, no abnormal pulsatility or arterial bruits, normal bowel sounds, no hepatosplenomegaly Extremities: no clubbing, cyanosis or edema; 2+ radial, ulnar and brachial pulses bilaterally; 2+ right femoral, posterior tibial and dorsalis pedis pulses; 2+ left femoral, posterior tibial and dorsalis pedis pulses; no subclavian or femoral bruits Neurological: grossly nonfocal   EKG: Sinus rhythm nonspecific T wave inversion in leads V2 and V3  Lipid Panel     Component Value Date/Time   CHOL 120 10/15/2012 0630   TRIG 104 10/15/2012 0630   HDL 51 10/15/2012 0630   CHOLHDL 2.4 10/15/2012 0630   VLDL 21 10/15/2012  0630   LDLCALC 48 10/15/2012 0630    BMET    Component Value Date/Time   NA 141 10/15/2012 0630   K 3.7 10/15/2012 0630   CL 109 10/15/2012 0630   CO2 22 10/15/2012 0630   GLUCOSE 93 10/15/2012 0630   BUN 14 10/15/2012 0630   CREATININE 1.11 10/15/2012 0630   CALCIUM 8.7 10/15/2012 0630   GFRNONAA 63* 10/15/2012 0630   GFRAA 73* 10/15/2012 0630     ASSESSMENT AND PLAN CAD (coronary artery disease) June 2012,  Unstable angina, catheterization revealed 95% mid LAD, 95% OM-1 and a 50-60% ramus intermedius artery stenoses. He had elective ramus intermedius and left anterior descending drug-eluting stent placement.   Asymptomatic despite active lifestyle - daily exercise; nuclear stress test in February 2014 without reversible ischemia or convincing evidence of scar . Ejection fraction 58% with normal wall motion.  The patient was encouraged to maintain his active lifestyle. This may help him lose the weight that he has gained.   HTN (hypertension) Satisfactory control on angiotensin receptor blocker and beta blocker therapy  Dyslipidemia Satisfactory lipid parameters on treatment with pravastatin  Orders Placed This Encounter  Procedures  . EKG 12-Lead   No orders of the defined types were placed in this encounter.    Junious Silk, MD, [facility] [phone] office [phone] pager

## 2013-02-12 ENCOUNTER — Encounter: Payer: Self-pay | Admitting: Cardiovascular Disease

## 2013-02-12 DIAGNOSIS — E785 Hyperlipidemia, unspecified: Secondary | ICD-10-CM | POA: Insufficient documentation

## 2013-02-12 NOTE — Assessment & Plan Note (Signed)
Satisfactory lipid parameters on treatment with pravastatin

## 2013-02-12 NOTE — Assessment & Plan Note (Addendum)
June 2012,  Unstable angina, catheterization revealed 95% mid LAD, 95% OM-1 and a 50-60% ramus intermedius artery stenoses. He had elective ramus intermedius and left anterior descending drug-eluting stent placement.   Asymptomatic despite active lifestyle - daily exercise; nuclear stress test in February 2014 without reversible ischemia or convincing evidence of scar . Ejection fraction 58% with normal wall motion.  The patient was encouraged to maintain his active lifestyle. This may help him lose the weight that he has gained.

## 2013-02-12 NOTE — Assessment & Plan Note (Addendum)
Satisfactory control on angiotensin receptor blocker and beta blocker therapy, but it sounds like the losartan effect is wearing off in less than 24 hours. Would change it to half tablet twice daily.

## 2013-03-07 ENCOUNTER — Encounter (HOSPITAL_COMMUNITY): Payer: Medicare Other | Attending: Oncology

## 2013-03-07 DIAGNOSIS — D649 Anemia, unspecified: Secondary | ICD-10-CM | POA: Insufficient documentation

## 2013-03-07 DIAGNOSIS — C189 Malignant neoplasm of colon, unspecified: Secondary | ICD-10-CM | POA: Insufficient documentation

## 2013-03-07 DIAGNOSIS — E611 Iron deficiency: Secondary | ICD-10-CM

## 2013-03-07 DIAGNOSIS — C182 Malignant neoplasm of ascending colon: Secondary | ICD-10-CM

## 2013-03-07 DIAGNOSIS — D509 Iron deficiency anemia, unspecified: Secondary | ICD-10-CM | POA: Insufficient documentation

## 2013-03-07 LAB — BASIC METABOLIC PANEL
CO2: 28 mEq/L (ref 19–32)
Calcium: 9.2 mg/dL (ref 8.4–10.5)
Creatinine, Ser: 1.23 mg/dL (ref 0.50–1.35)
Glucose, Bld: 158 mg/dL — ABNORMAL HIGH (ref 70–99)

## 2013-03-07 LAB — CBC WITH DIFFERENTIAL/PLATELET
Eosinophils Relative: 6 % — ABNORMAL HIGH (ref 0–5)
HCT: 46.1 % (ref 39.0–52.0)
Hemoglobin: 15.7 g/dL (ref 13.0–17.0)
Lymphocytes Relative: 28 % (ref 12–46)
Lymphs Abs: 3.2 10*3/uL (ref 0.7–4.0)
MCV: 92.4 fL (ref 78.0–100.0)
Monocytes Absolute: 0.8 10*3/uL (ref 0.1–1.0)
RBC: 4.99 MIL/uL (ref 4.22–5.81)
WBC: 11.5 10*3/uL — ABNORMAL HIGH (ref 4.0–10.5)

## 2013-03-07 NOTE — Progress Notes (Signed)
Labs drawn today for cea,cbc/diff,bmp

## 2013-03-12 ENCOUNTER — Ambulatory Visit (HOSPITAL_COMMUNITY)
Admission: RE | Admit: 2013-03-12 | Discharge: 2013-03-12 | Disposition: A | Discharge status: Home / Self Care | Payer: Medicare Other | Source: Ambulatory Visit | Attending: Oncology | Admitting: Oncology

## 2013-03-12 DIAGNOSIS — C189 Malignant neoplasm of colon, unspecified: Secondary | ICD-10-CM | POA: Insufficient documentation

## 2013-03-12 DIAGNOSIS — C182 Malignant neoplasm of ascending colon: Secondary | ICD-10-CM

## 2013-03-12 MED ORDER — IOHEXOL 300 MG/ML  SOLN
100.0000 mL | Freq: Once | INTRAMUSCULAR | Status: AC | PRN
  Administered 2013-03-12: 100 mL via INTRAVENOUS

## 2013-03-28 ENCOUNTER — Telehealth: Payer: Self-pay | Admitting: Internal Medicine

## 2013-03-28 NOTE — Telephone Encounter (Signed)
Noted agree with plan

## 2013-03-28 NOTE — Telephone Encounter (Signed)
Pt called to speak with RMR about staying nauseated a lot. He had several feet of his intestines removed and is taking Zofran. I made him OV to see RMR on 8/12 at 10 and he would like to see what RMR would advise. [phone]

## 2013-03-28 NOTE — Telephone Encounter (Signed)
Spoke with David Pugh- he is having nausea 4 hours after he eats, he will eat again and the nausea will go away but will come back in 4 hours. He stated this cycle goes on all day. It started about 2-3 weeks ago. No vomiting, no diarrhea. He wakes up with nausea in the morning, he likes to exercise before breakfast so he started taking zofran 4 days ago and it helps some, he is able to still exercise. He feels like it is getting worse. He is still taking a probiotic and zegerid. He had a CT done on 7/15 and stated they told him there was no cancer seen. He has an appt with RMR on 04/10/13 but would like to know what RMR recommends until his ov.

## 2013-03-29 NOTE — Telephone Encounter (Signed)
Pt wants to know if there is anything else he can do until his ov on 04/10/13 to help with his symptoms?

## 2013-03-29 NOTE — Telephone Encounter (Signed)
I called him at home last night. Having nausea for about 3 weeks worse in the morning;  does not necessarily occur postprandially. May occur 4 hours after eating a meal. Intermittent in nature. Is not keeping him from daily activities.  May occur as late as 4 hours after eating a meal. He does not vomit. He states he perceives quite a bit of anxiety recently in anticipation of recent CT a followup on his colon cancer. Fortunately, I have reviewed the CT and his recent labs.   CT looks good -  no evidence of metastatic disease. CEA was within normal limits. Reflux symptoms well controlled on Zegerid. No new medications. Absolutely denies any abdominal pain.  I suggested he utilize Zofran 4 mg the morning and then a second dose around midday as needed to combat nausea. Plan to see him in the office next month. He does have Zofran on hand left over from prescriptions related to prior chemotherapy.

## 2013-04-10 ENCOUNTER — Encounter: Payer: Self-pay | Admitting: Internal Medicine

## 2013-04-10 ENCOUNTER — Ambulatory Visit (INDEPENDENT_AMBULATORY_CARE_PROVIDER_SITE_OTHER): Payer: Medicare Other | Admitting: Internal Medicine

## 2013-04-10 ENCOUNTER — Telehealth: Payer: Self-pay | Admitting: *Deleted

## 2013-04-10 ENCOUNTER — Other Ambulatory Visit: Payer: Self-pay | Admitting: Internal Medicine

## 2013-04-10 VITALS — BP 114/69 | HR 69 | Temp 97.9°F | Ht 71.0 in | Wt 219.4 lb

## 2013-04-10 DIAGNOSIS — K219 Gastro-esophageal reflux disease without esophagitis: Secondary | ICD-10-CM

## 2013-04-10 DIAGNOSIS — R110 Nausea: Secondary | ICD-10-CM

## 2013-04-10 NOTE — Progress Notes (Signed)
Primary Care Physician:  Louie Boston, MD Primary Gastroenterologist:  Dr. Jena Gauss  Pre-Procedure History & Physical: HPI:  David Pugh is a 78 y.o. male here for   nausea for several weeks. GERD well-controlled on omeprazole 40 mg daily. Taking sodium bicarbonate tablets separately. He has nausea 3-4 hours after he eats a meal routinely. He does not have any vomiting. No abdominal pain. These his bowels 1-3 times daily. Denies melena, hematochezia .  No new meds. Gallbladder out. CT earlier this year indicates no evidence of recurrent colon cancer. Will be due for surveillance colonoscopy December of this year. He is realized recently to taking one 4 mg Zofran tablet each morning pretty much well she is his nausea and he is doing very well with that regimen. History of peripheral neuropathy. Small hiatal hernia on 2010 EGD otherwise normal.   Past Medical History  Diagnosis Date  . Premature ventricular contraction   . Prostate cancer 2010  . 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 Radiation Feb 2011    treatment  . Biliary dyskinesia   . GERD (gastroesophageal reflux disease)   . Hiatal hernia 2010    tiny  . HTN (hypertension)   . Rosacea   . PTSD (post-traumatic stress disorder)   . Angina pectoris, unstable   . Diverticula of colon 2010  . Colon polyps     tubular adenomas  . Arthritis   . Exposure to [employer]   . CAD (coronary artery disease)     hx of 2 stents   . Peripheral neuropathy   . Anemia     hx of anemia as a child   . Colon cancer 12/2011    s/p right hemicolectomy, did not tolerate chemo  . Iron deficiency 05/02/2012    followed by Dr. Mariel Sleet, iron infusions.   . Folic acid deficiency   . Heart palpitations 10/2012    Past Surgical History  Procedure Laterality Date  . Cholecystectomy    . Prostatectomy    . Tonsillectomy    . Transurethral resection of prostate    . Esophagogastroduodenoscopy  10/10/2008    Dr. Maceo Pro hiatal hernia, normal  esophagus, normal stomach  . Colonoscopy  02/12/2004    Dr. Jena Gauss- L side diverticula, inflammatory  colon polyps  . Esophagogastroduodenoscopy  02/12/2004    Dr. Geni Bers- normal   . Carotid stent  02/14/11  . Coronary stent placement  01/2011    2.5x41mm Xience drug-eluting stent in ramus intermedius/OMI vessel, 2.5x28mm Promus Element stent in mid LAD artery  . 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 Multiple colonic  polyps removed as described above. Sigmoid Diverticulosis/ 1.5 x 2 cm relatively flat ulcerated lesion in cecum/path showed adenocarcinoma of the colon. descending colon polyp with tubular adenoma and one fragment of focal high grade dysplasia.   . Knee surgery      left knee   . Partial colectomy  02/29/2012    Procedure: PARTIAL COLECTOMY;  Surgeon: Ernestene Mention, MD;  Location: [facility];  Service: General;  Laterality: Right;  . Vasectomy    . Cardiac catheterization  02/13/2011    [facility], [facility] cardiology  . Colonoscopy  08/28/2012    DGL:OVFIEPPIR proctitis. Colonic diverticulosis/Ulcerations at the surgical anastomosis  path: ulcerated colonic mucosa with prolapse changes, tubular adenoma.     Prior to Admission medications   Medication Sig Start Date End Date Taking?  Authorizing Provider  acetaminophen (TYLENOL) 650 MG CR tablet Take 1,300 mg by mouth 2 (two) times daily as needed.    Yes Historical Provider, MD  ALPRAZolam Prudy Feeler) 0.25 MG tablet Take 0.25 mg by mouth at bedtime as needed. Anxiety. 03/24/12  Yes Randall An, MD  aspirin 81 MG tablet Take 81 mg by mouth daily.   Yes Historical Provider, MD  citalopram (CELEXA) 10 MG tablet Take 10 mg by mouth every morning.    Yes Historical Provider, MD  folic acid (FOLVITE) 1 MG tablet Take 1 mg by mouth once a week. 03/27/12  Yes Randall An, MD  latanoprost (XALATAN) 0.005 % ophthalmic solution Place 1 drop into both eyes at  bedtime.    Yes Historical Provider, MD  losartan (COZAAR) 50 MG tablet Take 50 mg by mouth every morning.    Yes Historical Provider, MD  metoprolol (LOPRESSOR) 50 MG tablet Take 50 mg by mouth 2 (two) times daily.    Yes Historical Provider, MD  Multiple Vitamin (MULTIVITAMIN WITH MINERALS) TABS Take 1 tablet by mouth daily.   Yes Historical Provider, MD  nitroGLYCERIN (NITROSTAT) 0.4 MG SL tablet Place 0.4 mg under the tongue every 5 (five) minutes x 3 doses as needed. For chest pain   Yes Historical Provider, MD  omeprazole-sodium bicarbonate (ZEGERID) 40-1100 MG per capsule Take 1 capsule by mouth daily before breakfast. 11/14/12  Yes Nira Retort, NP  ondansetron (ZOFRAN) 4 MG tablet Take 4 mg by mouth every 8 (eight) hours as needed for nausea.   Yes Historical Provider, MD  pravastatin (PRAVACHOL) 40 MG tablet Take 40 mg by mouth at bedtime. Pt takes at bedtime   Yes Historical Provider, MD  Probiotic Product (PROBIOTIC DAILY PO) Take by mouth daily.   Yes Historical Provider, MD    Allergies as of 04/10/2013 - Review Complete 04/10/2013  Allergen Reaction Noted  . Shellfish allergy Anaphylaxis 01/13/2012  . Sulfa antibiotics  12/14/2012  . Sulfonamide derivatives Diarrhea 04/30/2009    Family History  Problem Relation Age of Onset  . Throat cancer Father   . Cancer Father     Throat  . Prostate cancer Brother   . Cancer Brother     Prostate and Skin  . Colon cancer Neg Hx     History   Social History  . Marital Status: Married    Spouse Name: N/A    Number of Children: 3  . Years of Education: N/A   Occupational History  . Retired   .     Social History Main Topics  . Smoking status: Never Smoker   . Smokeless tobacco: Never Used  . Alcohol Use: Yes     Comment: 1 cocktail/month  . Drug Use: No  . Sexually Active: Yes    Birth Control/ Protection: None   Other Topics Concern  . Not on file   Social History Narrative  . No narrative on file    Review of  Systems: See HPI, otherwise negative ROS  Physical Exam: BP 114/69  Pulse 69  Temp(Src) 97.9 F (36.6 C) (Oral)  Ht 5\' 11"  (1.803 m)  Wt 219 lb 6.4 oz (99.519 kg)  BMI 30.61 kg/m2 General:   Alert,  Well-developed, well-nourished, pleasant and cooperative in NAD Skin:  Intact without significant lesions or rashes. Eyes:  Sclera clear, no icterus.   Conjunctiva pink. Ears:  Normal auditory acuity. Nose:  No deformity, discharge,  or lesions. Mouth:  No deformity or  lesions. Neck:  Supple; no masses or thyromegaly. No significant cervical adenopathy. Lungs:  Clear throughout to auscultation.   No wheezes, crackles, or rhonchi. No acute distress. Heart:  Regular rate and rhythm; no murmurs, clicks, rubs,  or gallops. Abdomen: Non-distended, normal bowel sounds.  Soft and nontender without appreciable mass or hepatosplenomegaly. No succussion splash. Pulses:  Normal pulses noted. Extremities:  Without clubbing or edema.  Impression/Plan:  78 year old gentleman with nausea almost daily without other associated symptoms. GERD symptoms well controlled. Sodium bicarbonate tablets not likely adding much to his regimen.  Gallbladder out. No new medications to implicate as a cause of nausea. 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 At any rate, no alarm symptoms. Doing very well with 1 Zofran tablets as well as one 40 mg omeprazole each morning.  Recommendations:   Continue omeprazole 40 mg daily  Continue Zofran 4 mg daily as needed  Solid phase GES to evaluate nausea  Further recommendations to follow

## 2013-04-10 NOTE — Telephone Encounter (Signed)
Routing to [employer]- pt has questions about his procedure instructions.

## 2013-04-10 NOTE — Telephone Encounter (Signed)
Pt called because he has a question about his medication, on his paper work it told him nothing to eat or drink after midnight before his procedure. Please advise [phone]

## 2013-04-10 NOTE — Patient Instructions (Addendum)
Continue omeprazole 40 mg daily  Continue Zofran 4 mg daily as needed  Solid phase GES to evaluate nausea  Further recommendations to follow

## 2013-04-10 NOTE — Telephone Encounter (Signed)
I spoke to David Pugh and explained

## 2013-04-12 ENCOUNTER — Encounter (HOSPITAL_COMMUNITY)
Admission: RE | Admit: 2013-04-12 | Discharge: 2013-04-12 | Disposition: A | Discharge status: Home / Self Care | Payer: Medicare Other | Source: Ambulatory Visit | Attending: Internal Medicine | Admitting: Internal Medicine

## 2013-04-12 ENCOUNTER — Encounter (HOSPITAL_COMMUNITY): Payer: Self-pay

## 2013-04-12 DIAGNOSIS — R110 Nausea: Secondary | ICD-10-CM | POA: Insufficient documentation

## 2013-04-12 MED ORDER — TECHNETIUM TC 99M SULFUR COLLOID
2.0000 | Freq: Once | INTRAVENOUS | Status: AC | PRN
  Administered 2013-04-12: 2 via INTRAVENOUS

## 2013-04-17 NOTE — Progress Notes (Signed)
Quick Note:  Reminder appt made ______ 

## 2013-04-17 NOTE — Progress Notes (Signed)
Reminder appt made 

## 2013-05-30 ENCOUNTER — Encounter (HOSPITAL_COMMUNITY): Payer: Medicare Other | Attending: Oncology

## 2013-05-30 DIAGNOSIS — D509 Iron deficiency anemia, unspecified: Secondary | ICD-10-CM | POA: Insufficient documentation

## 2013-05-30 DIAGNOSIS — E611 Iron deficiency: Secondary | ICD-10-CM

## 2013-05-30 DIAGNOSIS — C182 Malignant neoplasm of ascending colon: Secondary | ICD-10-CM | POA: Insufficient documentation

## 2013-05-30 LAB — CBC WITH DIFFERENTIAL/PLATELET
Basophils Absolute: 0 10*3/uL (ref 0.0–0.1)
Eosinophils Absolute: 0.5 10*3/uL (ref 0.0–0.7)
Eosinophils Relative: 5 % (ref 0–5)
MCH: 31.3 pg (ref 26.0–34.0)
MCV: 93.3 fL (ref 78.0–100.0)
Neutrophils Relative %: 57 % (ref 43–77)
Platelets: 245 10*3/uL (ref 150–400)
RDW: 12.7 % (ref 11.5–15.5)
WBC: 10 10*3/uL (ref 4.0–10.5)

## 2013-05-30 LAB — IRON AND TIBC
Saturation Ratios: 74 % — ABNORMAL HIGH (ref 20–55)
UIBC: 71 ug/dL — ABNORMAL LOW (ref 125–400)

## 2013-05-30 LAB — COMPREHENSIVE METABOLIC PANEL
ALT: 16 U/L (ref 0–53)
AST: 16 U/L (ref 0–37)
Albumin: 3.3 g/dL — ABNORMAL LOW (ref 3.5–5.2)
Calcium: 9.1 mg/dL (ref 8.4–10.5)
Potassium: 4 mEq/L (ref 3.5–5.1)
Sodium: 140 mEq/L (ref 135–145)
Total Protein: 6.4 g/dL (ref 6.0–8.3)

## 2013-05-30 LAB — FERRITIN: Ferritin: 81 ng/mL (ref 22–322)

## 2013-05-30 LAB — FOLATE: Folate: >20 ng/mL

## 2013-05-30 NOTE — Progress Notes (Signed)
Labs drawn today for cbc/diff,cmp,Iron and IBC,ferr,folate,cea

## 2013-05-31 LAB — CEA: CEA: 0.9 ng/mL (ref 0.0–5.0)

## 2013-06-01 ENCOUNTER — Encounter (HOSPITAL_COMMUNITY): Payer: Self-pay

## 2013-06-01 ENCOUNTER — Encounter (HOSPITAL_BASED_OUTPATIENT_CLINIC_OR_DEPARTMENT_OTHER): Payer: Medicare Other

## 2013-06-01 VITALS — BP 112/65 | HR 60 | Temp 98.1°F | Resp 18 | Wt 225.4 lb

## 2013-06-01 DIAGNOSIS — K219 Gastro-esophageal reflux disease without esophagitis: Secondary | ICD-10-CM

## 2013-06-01 DIAGNOSIS — E538 Deficiency of other specified B group vitamins: Secondary | ICD-10-CM

## 2013-06-01 DIAGNOSIS — C182 Malignant neoplasm of ascending colon: Secondary | ICD-10-CM

## 2013-06-01 DIAGNOSIS — E611 Iron deficiency: Secondary | ICD-10-CM

## 2013-06-01 DIAGNOSIS — G629 Polyneuropathy, unspecified: Secondary | ICD-10-CM

## 2013-06-01 DIAGNOSIS — G609 Hereditary and idiopathic neuropathy, unspecified: Secondary | ICD-10-CM

## 2013-06-01 NOTE — Progress Notes (Signed)
[facility] OFFICE PROGRESS NOTE  David Boston, MD [address] [place] [address]  DIAGNOSIS: Cancer of ascending colon - Plan: CEA, CANCELED: CBC with Differential, CANCELED: Comprehensive metabolic panel, CANCELED: CEA  Iron deficiency - Plan: CANCELED: CBC with Differential, CANCELED: Ferritin  Folic acid deficiency - Plan: CANCELED: CBC with Differential  Peripheral neuropathy  GERD  Chief Complaint  Patient presents with  . Colon Cancer    CURRENT THERAPY: Watchful expectation.  INTERVAL HISTORY: DIARRA Pugh 78 y.o. male returns for followup of stage III colon cancer, status post colectomy with inability to tolerate Xeloda adjuvant therapy. He continues to do well excellent appetite. He denies any nausea, vomiting, diarrhea, constipation, melena, hematochezia, hematuria, fever, night sweats, lower extremity swelling or redness, urinary hesitancy, incontinence, dysuria, skin rash, joint pain, cough, shortness of breath, wheezing, headache, or seizures.   MEDICAL HISTORY: Past Medical History  Diagnosis Date  . Premature ventricular contraction   . Prostate cancer 2010  . 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 Radiation Feb 2011    treatment  . Biliary dyskinesia   . GERD (gastroesophageal reflux disease)   . Hiatal hernia 2010    tiny  . HTN (hypertension)   . Rosacea   . PTSD (post-traumatic stress disorder)   . Angina pectoris, unstable   . Diverticula of colon 2010  . Colon polyps     tubular adenomas  . Arthritis   . Exposure to [employer]   . CAD (coronary artery disease)     hx of 2 stents   . Peripheral neuropathy   . Anemia     hx of anemia as a child   . Colon cancer 12/2011    s/p right hemicolectomy, did not tolerate chemo  . Iron deficiency 05/02/2012    followed by Dr. Mariel Sleet, iron infusions.   . Folic acid deficiency   . Heart palpitations 10/2012    INTERIM HISTORY: has PREMATURE VENTRICULAR CONTRACTIONS;  GERD; Cancer of ascending colon; Iron deficiency; Hx of adenomatous colonic polyps; Folic acid deficiency; Peripheral neuropathy; Chest pain - ruled out for MI; negative Nuclear Stress Test 10/17/12; CAD (coronary artery disease); HTN (hypertension); and Dyslipidemia on his problem list.  78 y.o. male returns for regular visit for followup of Stage III cancer of the colon S/P surgical resection with him tolerance to capecitabine which had to be discontinued AND iron deficiency anemia.   ALLERGIES:  is allergic to shellfish allergy; sulfa antibiotics; and sulfonamide derivatives.  MEDICATIONS: has a current medication list which includes the following prescription(s): acetaminophen, aspirin, b complex vitamins, citalopram, folic acid, latanoprost, losartan, metoprolol, multivitamin with minerals, nitroglycerin, omeprazole-sodium bicarbonate, pravastatin, probiotic product, and alprazolam.  SURGICAL HISTORY:  Past Surgical History  Procedure Laterality Date  . Cholecystectomy    . Prostatectomy    . Tonsillectomy    . Transurethral resection of prostate    . Esophagogastroduodenoscopy  10/10/2008    Dr. Maceo Pro hiatal hernia, normal esophagus, normal stomach  . Colonoscopy  02/12/2004    Dr. Jena Gauss- L side diverticula, inflammatory  colon polyps  . Esophagogastroduodenoscopy  02/12/2004    Dr. Geni Bers- normal   . Carotid stent  02/14/11  . Coronary stent placement  01/2011    2.5x67mm Xience drug-eluting stent in ramus intermedius/OMI vessel, 2.5x59mm Promus Element stent in mid LAD artery  . 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  Multiple colonic  polyps removed as described above. Sigmoid Diverticulosis/ 1.5 x 2 cm relatively flat ulcerated lesion in cecum/path showed adenocarcinoma of the colon. descending colon polyp with tubular adenoma and one fragment of focal high grade dysplasia.   . Knee surgery      left  knee   . Partial colectomy  02/29/2012    Procedure: PARTIAL COLECTOMY;  Surgeon: Ernestene Mention, MD;  Location: [facility];  Service: General;  Laterality: Right;  . Vasectomy    . Cardiac catheterization  02/13/2011    [facility], [facility] cardiology  . Colonoscopy  08/28/2012    ZOX:[id number] proctitis. Colonic diverticulosis/Ulcerations at the surgical anastomosis  path: ulcerated colonic mucosa with prolapse changes, tubular adenoma.     FAMILY HISTORY: family history includes Cancer in his brother and father; Prostate cancer in his brother; Throat cancer in his father. There is no history of Colon cancer.  SOCIAL HISTORY:  reports that he has never smoked. He has never used smokeless tobacco. He reports that  drinks alcohol. He reports that he does not use illicit drugs.  REVIEW OF SYSTEMS:  Other than that discussed above is noncontributory.  PHYSICAL EXAMINATION: ECOG PERFORMANCE STATUS: 0 - Asymptomatic  Blood pressure 112/65, pulse 60, temperature 98.1 F (36.7 C), temperature source Oral, resp. rate 18, weight 225 lb 6.4 oz (102.241 kg).  GENERAL:alert, no distress and comfortable SKIN: skin color, texture, turgor are normal, no rashes or significant lesions EYES: PERLA; Conjunctiva are pink and non-injected, sclera clear OROPHARYNX:no exudate, no erythema on lips, buccal mucosa, or tongue. NECK: supple, thyroid normal size, non-tender, without nodularity. No masses CHEST: Normal AP diameter with no gynecomastia. LYMPH:  no palpable lymphadenopathy in the cervical, axillary or inguinal LUNGS: clear to auscultation and percussion with normal breathing effort HEART: regular rate & rhythm and no murmurs and no lower extremity edema ABDOMEN:abdomen soft, non-tender and normal bowel sounds. Her to the well healed. MUSCULOSKELETAL:no cyanosis of digits and no clubbing. Range of motion normal.  NEURO: alert & oriented x 3 with fluent speech, no focal motor/sensory  deficits   LABORATORY DATA: Infusion on 05/30/2013  Component Date Value Range Status  . WBC 05/30/2013 10.0  4.0 - 10.5 K/uL Final  . RBC 05/30/2013 4.79  4.22 - 5.81 MIL/uL Final  . Hemoglobin 05/30/2013 15.0  13.0 - 17.0 g/dL Final  . HCT 81/19/1478 44.7  39.0 - 52.0 % Final  . MCV 05/30/2013 93.3  78.0 - 100.0 fL Final  . MCH 05/30/2013 31.3  26.0 - 34.0 pg Final  . MCHC 05/30/2013 33.6  30.0 - 36.0 g/dL Final  . RDW 29/56/2130 12.7  11.5 - 15.5 % Final  . Platelets 05/30/2013 245  150 - 400 K/uL Final  . Neutrophils Relative % 05/30/2013 57  43 - 77 % Final  . Neutro Abs 05/30/2013 5.7  1.7 - 7.7 K/uL Final  . Lymphocytes Relative 05/30/2013 30  12 - 46 % Final  . Lymphs Abs 05/30/2013 3.0  0.7 - 4.0 K/uL Final  . Monocytes Relative 05/30/2013 9  3 - 12 % Final  . Monocytes Absolute 05/30/2013 0.9  0.1 - 1.0 K/uL Final  . Eosinophils Relative 05/30/2013 5  0 - 5 % Final  . Eosinophils Absolute 05/30/2013 0.5  0.0 - 0.7 K/uL Final  . Basophils Relative 05/30/2013 0  0 - 1 % Final  . Basophils Absolute 05/30/2013 0.0  0.0 - 0.1 K/uL Final  . Sodium 05/30/2013 140  135 - 145  mEq/L Final  . Potassium 05/30/2013 4.0  3.5 - 5.1 mEq/L Final  . Chloride 05/30/2013 106  96 - 112 mEq/L Final  . CO2 05/30/2013 28  19 - 32 mEq/L Final  . Glucose, Bld 05/30/2013 122* 70 - 99 mg/dL Final  . BUN 16/05/9603 14  6 - 23 mg/dL Final  . Creatinine, Ser 05/30/2013 1.26  0.50 - 1.35 mg/dL Final  . Calcium 54/04/8118 9.1  8.4 - 10.5 mg/dL Final  . Total Protein 05/30/2013 6.4  6.0 - 8.3 g/dL Final  . Albumin 14/78/2956 3.3* 3.5 - 5.2 g/dL Final  . AST 21/30/8657 16  0 - 37 U/L Final  . ALT 05/30/2013 16  0 - 53 U/L Final  . Alkaline Phosphatase 05/30/2013 59  39 - 117 U/L Final  . Total Bilirubin 05/30/2013 0.8  0.3 - 1.2 mg/dL Final  . GFR calc non Af Amer 05/30/2013 54* >90 mL/min Final  . GFR calc Af Amer 05/30/2013 62* >90 mL/min Final   Comment: (NOTE)                          The eGFR  has been calculated using the CKD EPI equation.                          This calculation has not been validated in all clinical situations.                          eGFR's persistently <90 mL/min signify possible Chronic Kidney                          Disease.  . Iron 05/30/2013 202* 42 - 135 ug/dL Final  . TIBC 84/69/6295 273  215 - 435 ug/dL Final  . Saturation Ratios 05/30/2013 74* 20 - 55 % Final  . UIBC 05/30/2013 71* 125 - 400 ug/dL Final   Performed at [facility]  . Ferritin 05/30/2013 81  22 - 322 ng/mL Final   Performed at [facility]  . Folate 05/30/2013 >20.0   Final   Comment: (NOTE)                          Reference Ranges                                 Deficient:       0.4 - 3.3 ng/mL                                 Indeterminate:   3.4 - 5.4 ng/mL                                 Normal:              > 5.4 ng/mL                          Performed at [facility]  . CEA 05/30/2013 0.9  0.0 - 5.0 ng/mL Final   Performed at [facility]    PATHOLOGY:  Urinalysis    Component  Value Date/Time   COLORURINE YELLOW 03/11/2012 1200   APPEARANCEUR CLEAR 03/11/2012 1200   LABSPEC 1.010 03/11/2012 1200   PHURINE 7.5 03/11/2012 1200   GLUCOSEU NEGATIVE 03/11/2012 1200   HGBUR NEGATIVE 03/11/2012 1200   BILIRUBINUR NEGATIVE 03/11/2012 1200   KETONESUR NEGATIVE 03/11/2012 1200   PROTEINUR NEGATIVE 03/11/2012 1200   UROBILINOGEN 0.2 03/11/2012 1200   NITRITE NEGATIVE 03/11/2012 1200   LEUKOCYTESUR NEGATIVE 03/11/2012 1200    RADIOGRAPHIC STUDIES: No results found.  ASSESSMENT: #1. Stage III carcinoma:, No evidence of disease, no postop therapy due to intolerance of Xeloda. #2. Neuropathy, stable. #3. Iron deficiency anemia, status post perineum in August and November of 2013, stable. #4. Folic acid deficiency, now normal.   PLAN: #1. Patient has received influenza virus vaccine prior to today's visit. #2. CEA in 3 months. #3. CT scan,  office visit, and additional laboratory evaluation in 6 months. #4. Proceed with colonoscopy in December of 2014.   All questions were answered. The patient knows to call the clinic with any problems, questions or concerns. We can certainly see the patient much sooner if necessary.   I spent 30 minutes counseling the patient face to face. The total time spent in the appointment was 25 minutes.    Maurilio Lovely, MD 06/01/2013 10:11 AM

## 2013-06-01 NOTE — Patient Instructions (Addendum)
[facility] Discharge Instructions  RECOMMENDATIONS MADE BY THE CONSULTANT AND ANY TEST RESULTS WILL BE SENT TO YOUR REFERRING PHYSICIAN.  EXAM FINDINGS BY THE PHYSICIAN TODAY AND SIGNS OR SYMPTOMS TO REPORT TO CLINIC OR PRIMARY PHYSICIAN: Exam and findings as discussed by Dr.Formanek.  Will monitor you closely.  CEA in 3 months and labs and scans in 6 months and follow-up visit in 6 months.  Report changes in bowel habits, blood in your bowel movement, unexplained weight loss or other problems.  MEDICATIONS PRESCRIBED:  none  INSTRUCTIONS/FOLLOW-UP: Blood work in 3 and 6 months, scans in 6 months and follow-up after scans in 6 months.  Thank you for choosing [facility] to provide your oncology and hematology care.  To afford each patient quality time with our providers, please arrive at least 15 minutes before your scheduled appointment time.  With your help, our goal is to use those 15 minutes to complete the necessary work-up to ensure our physicians have the information they need to help with your evaluation and healthcare recommendations.    Effective January 1st, 2014, we ask that you re-schedule your appointment with our physicians should you arrive 10 or more minutes late for your appointment.  We strive to give you quality time with our providers, and arriving late affects you and other patients whose appointments are after yours.    Again, thank you for choosing [facility].  Our hope is that these requests will decrease the amount of time that you wait before being seen by our physicians.       _____________________________________________________________  Should you have questions after your visit to [facility], please contact our office at [phone] between the hours of 8:30 a.m. and 5:00 p.m.  Voicemails left after 4:30 p.m. will not be returned until the following business day.  For prescription refill requests,  have your pharmacy contact our office with your prescription refill request.

## 2013-06-25 ENCOUNTER — Other Ambulatory Visit: Payer: Self-pay | Admitting: Cardiovascular Disease

## 2013-06-25 NOTE — Telephone Encounter (Signed)
Rx was sent to pharmacy electronically. 

## 2013-07-09 ENCOUNTER — Other Ambulatory Visit: Payer: Self-pay | Admitting: Gastroenterology

## 2013-07-10 ENCOUNTER — Encounter (INDEPENDENT_AMBULATORY_CARE_PROVIDER_SITE_OTHER): Payer: Self-pay

## 2013-07-10 ENCOUNTER — Ambulatory Visit (INDEPENDENT_AMBULATORY_CARE_PROVIDER_SITE_OTHER): Payer: 59 | Admitting: Internal Medicine

## 2013-07-10 ENCOUNTER — Encounter: Payer: Self-pay | Admitting: Internal Medicine

## 2013-07-10 VITALS — BP 123/72 | HR 62 | Temp 97.5°F | Wt 228.2 lb

## 2013-07-10 DIAGNOSIS — D126 Benign neoplasm of colon, unspecified: Secondary | ICD-10-CM

## 2013-07-10 NOTE — Patient Instructions (Signed)
Schedule surveillance colonoscopy (preference for early December)

## 2013-07-10 NOTE — Progress Notes (Signed)
Primary Care Physician:  [name],[name], MD Primary Gastroenterologist:  Dr. Raj Landress  Pre-Procedure History & Physical: HPI:  David Pugh is a 78 y.o. male here for a schedule a followup surveillance colonoscopy. 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 Followup colonoscopy in December of last year demonstrated ulcerated anastomosis (biopsies benign) and descending colon adenoma. History of polyp with high-grade dysplasia in the same area where the descending adenoma was removed previously. He's done well. Not having any bowel symptoms. History of radiation proctitis. GERD symptoms well controlled;  borderline delayed gastric emptying treated with diet only. Intermittent nausea likely associated with borderline point gastric emptying also not a problem currently. He is really doing very well overall from a GI standpoint.  Past Medical History  Diagnosis Date  . Premature ventricular contraction   . Prostate cancer 2010  . 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 Radiation Feb 2011    treatment  . Biliary dyskinesia   . GERD (gastroesophageal reflux disease)   . Hiatal hernia 2010    tiny  . HTN (hypertension)   . Rosacea   . PTSD (post-traumatic stress disorder)   . Angina pectoris, unstable   . Diverticula of colon 2010  . Colon polyps     tubular adenomas  . Arthritis   . Exposure to Agent Orange   . CAD (coronary artery disease)     hx of 2 stents   . Peripheral neuropathy   . Anemia     hx of anemia as a child   . Colon cancer 12/2011    s/p right hemicolectomy, did not tolerate chemo  . Iron deficiency 05/02/2012    followed by Dr. Neijstrom, iron infusions.   . Folic acid deficiency   . Heart palpitations 10/2012  . Gastroparesis 04/12/2013    borderline    Past Surgical History  Procedure Laterality Date  . Cholecystectomy    . Prostatectomy    . Tonsillectomy    . Transurethral resection of prostate    .  Esophagogastroduodenoscopy  10/10/2008    Dr. Kincaid Tiger-tiny hiatal hernia, normal esophagus, normal stomach  . Colonoscopy  02/12/2004    Dr. Ashon Rosenberg- L side diverticula, inflammatory  colon polyps  . Esophagogastroduodenoscopy  02/12/2004    Dr. Rouk- normal   . Carotid stent  02/14/11  . Coronary stent placement  01/2011    2.5x33mm Xience drug-eluting stent in ramus intermedius/OMI vessel, 2.5x60mm Promus Element stent in mid LAD artery  . 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 Multiple colonic  polyps removed as described above. Sigmoid Diverticulosis/ 1.5 x 2 cm relatively flat ulcerated lesion in cecum/path showed adenocarcinoma of the colon. descending colon polyp with tubular adenoma and one fragment of focal high grade dysplasia.   . Knee surgery      left knee   . Partial colectomy  02/29/2012    Procedure: PARTIAL COLECTOMY;  Surgeon: Haywood M Ingram, MD;  Location: [facility];  Service: General;  Laterality: Right;  . Vasectomy    . Cardiac catheterization  02/13/2011    [facility]  . Colonoscopy  08/28/2012    RMR:Radiation proctitis. Colonic diverticulosis/Ulcerations at the surgical anastomosis  path: ulcerated colonic mucosa with prolapse changes, tubular adenoma.     Prior to Admission medications   Medication Sig Start Date End Date Taking? Authorizing Provider  acetaminophen (TYLENOL)   650 MG CR tablet Take 1,300 mg by mouth 2 (two) times daily as needed.    Yes Historical Provider, MD  ALPRAZolam (XANAX) 0.25 MG tablet Take 0.25 mg by mouth at bedtime as needed. Anxiety. 03/24/12  Yes Eric S Neijstrom, MD  aspirin 81 MG tablet Take 81 mg by mouth daily.   Yes Historical Provider, MD  B Complex Vitamins (VITAMIN B COMPLEX PO) Take 1 capsule by mouth daily.   Yes Historical Provider, MD  cetirizine (ZYRTEC) 5 MG tablet Take 10 mg by mouth daily.   Yes Historical Provider, MD  citalopram  (CELEXA) 10 MG tablet Take 10 mg by mouth every morning.    Yes Historical Provider, MD  folic acid (FOLVITE) 1 MG tablet Take 1 mg by mouth once a week. 03/27/12  Yes Eric S Neijstrom, MD  latanoprost (XALATAN) 0.005 % ophthalmic solution Place 1 drop into both eyes at bedtime.    Yes Historical Provider, MD  losartan (COZAAR) 50 MG tablet Take 50 mg by mouth every morning.    Yes Historical Provider, MD  metoprolol (LOPRESSOR) 50 MG tablet Take 50 mg by mouth 2 (two) times daily.    Yes Historical Provider, MD  Multiple Vitamin (MULTIVITAMIN WITH MINERALS) TABS Take 1 tablet by mouth daily.   Yes Historical Provider, MD  nitroGLYCERIN (NITROSTAT) 0.4 MG SL tablet Place 0.4 mg under the tongue every 5 (five) minutes x 3 doses as needed. For chest pain   Yes Historical Provider, MD  omeprazole (PRILOSEC) 20 MG capsule Take 2 capsules (40 mg total) by mouth daily. Before breakfast. 07/09/13  Yes Anna W Sams, NP  Omeprazole-Sodium Bicarbonate (ZEGERID PO) Take 1 capsule by mouth daily. Does not contain the sodium bicarbonate.   Yes Historical Provider, MD  pravastatin (PRAVACHOL) 40 MG tablet TAKE ONE TABLET BY MOUTH AT BEDTIME. 06/25/13  Yes Mihai Croitoru, MD  Probiotic Product (PROBIOTIC DAILY PO) Take by mouth daily.   Yes Historical Provider, MD    Allergies as of 07/10/2013 - Review Complete 07/10/2013  Allergen Reaction Noted  . Shellfish allergy Anaphylaxis 01/13/2012  . Sulfa antibiotics  12/14/2012  . Sulfonamide derivatives Diarrhea 04/30/2009    Family History  Problem Relation Age of Onset  . Throat cancer Father   . Cancer Father     Throat  . Prostate cancer Brother   . Cancer Brother     Prostate and Skin  . Colon cancer Neg Hx     History   Social History  . Marital Status: Married    Spouse Name: N/A    Number of Children: 3  . Years of Education: N/A   Occupational History  . Retired   .     Social History Main Topics  . Smoking status: Never Smoker   .  Smokeless tobacco: Never Used  . Alcohol Use: Yes     Comment: 1 cocktail/month  . Drug Use: No  . Sexual Activity: Yes    Birth Control/ Protection: None   Other Topics Concern  . Not on file   Social History Narrative  . No narrative on file    Review of Systems: See HPI, otherwise negative ROS  Physical Exam: BP 123/72  Pulse 62  Temp(Src) 97.5 F (36.4 C) (Oral)  Wt 228 lb 3.2 oz (103.511 kg) General:   Alert,  Well-developed, well-nourished, pleasant and cooperative in NAD. Accompanied by spouse. Skin:  Intact without significant lesions or rashes. Eyes:  Sclera clear, no icterus.     Conjunctiva pink. Ears:  Normal auditory acuity. Nose:  No deformity, discharge,  or lesions. Mouth:  No deformity or lesions. Neck:  Supple; no masses or thyromegaly. No significant cervical adenopathy. Lungs:  Clear throughout to auscultation.   No wheezes, crackles, or rhonchi. No acute distress. Heart:  Regular rate and rhythm; no murmurs, clicks, rubs,  or gallops. Abdomen: Non-distended, normal bowel sounds.  Soft and nontender without appreciable mass or hepatosplenomegaly.  Pulses:  Normal pulses noted. Extremities:  Without clubbing or edema.  Impression: 78-year-old gentleman with a history of cecal colon carcinoma and recurrent adenomatous polyps. History of anastomotic ulcers of uncertain significance query related to aspirin. He is doing well now;  iron deficiency state  seems to have resolved. I feel he needs somewhat of an early followup interval colonoscopy given the recurrent nature of descending colon polyps.  To this end, I have offered the patient a surveillance colonoscopy at this time.  The risks, benefits, limitations, alternatives and imponderables have been reviewed with the patient. Questions have been answered. All parties are agreeable.  GERD symptoms well controlled on omeprazole. Borderline delayed gastric emptying-asymptomatic; symptoms controlled with diet. 

## 2013-07-11 ENCOUNTER — Encounter: Payer: Self-pay | Admitting: Internal Medicine

## 2013-07-20 ENCOUNTER — Encounter (HOSPITAL_COMMUNITY): Payer: Self-pay | Admitting: Pharmacy Technician

## 2013-07-24 ENCOUNTER — Encounter (HOSPITAL_COMMUNITY): Payer: Self-pay | Admitting: Pharmacy Technician

## 2013-07-25 NOTE — Patient Instructions (Signed)
MANFORD SPRONG  07/25/2013   Your procedure is scheduled on:  08/06/13  Report to [facility] at 1100 AM.  Call this number if you have problems the morning of surgery: [phone]   Remember:   Do not eat food or drink liquids after midnight.   Take these medicines the morning of surgery with A SIP OF WATER: xanax, zyrtec, celexa, cozaar, lopressor, prilosec   Do not wear jewelry, make-up or nail polish.  Do not wear lotions, powders, or perfumes. You may wear deodorant.  Do not shave 48 hours prior to surgery. Men may shave face and neck.  Do not bring valuables to the hospital.  [facility] is not responsible                  for any belongings or valuables.               Contacts, dentures or bridgework may not be worn into surgery.  Leave suitcase in the car. After surgery it may be brought to your room.  For patients admitted to the hospital, discharge time is determined by your                treatment team.               Patients discharged the day of surgery will not be allowed to drive  home.  Name and phone number of your driver: family  Special Instructions: N/A   Please read over the following fact sheets that you were given: Anesthesia Post-op Instructions and Care and Recovery After Surgery   PATIENT INSTRUCTIONS POST-ANESTHESIA  IMMEDIATELY FOLLOWING SURGERY:  Do not drive or operate machinery for the first twenty four hours after surgery.  Do not make any important decisions for twenty four hours after surgery or while taking narcotic pain medications or sedatives.  If you develop intractable nausea and vomiting or a severe headache please notify your doctor immediately.  FOLLOW-UP:  Please make an appointment with your surgeon as instructed. You do not need to follow up with anesthesia unless specifically instructed to do so.  WOUND CARE INSTRUCTIONS (if applicable):  Keep a dry clean dressing on the anesthesia/puncture wound site if there is drainage.  Once the  wound has quit draining you may leave it open to air.  Generally you should leave the bandage intact for twenty four hours unless there is drainage.  If the epidural site drains for more than 36-48 hours please call the anesthesia department.  QUESTIONS?:  Please feel free to call your physician or the hospital operator if you have any questions, and they will be happy to assist you.      Cataract Surgery  A cataract is a clouding of the lens of the eye. When a lens becomes cloudy, vision is reduced based on the degree and nature of the clouding. Surgery may be needed to improve vision. Surgery removes the cloudy lens and usually replaces it with a substitute lens (intraocular lens, IOL). LET YOUR EYE DOCTOR KNOW ABOUT:  Allergies to food or medicine.  Medicines taken including herbs, eyedrops, over-the-counter medicines, and creams.  Use of steroids (by mouth or creams).  Previous problems with anesthetics or numbing medicine.  History of bleeding problems or blood clots.  Previous surgery.  Other health problems, including diabetes and kidney problems.  Possibility of pregnancy, if this applies. RISKS AND COMPLICATIONS  Infection.  Inflammation of the eyeball (endophthalmitis) that can spread to both eyes (sympathetic  ophthalmia).  Poor wound healing.  If an IOL is inserted, it can later fall out of proper position. This is very uncommon.  Clouding of the part of your eye that holds an IOL in place. This is called an "after-cataract." These are uncommon, but easily treated. BEFORE THE PROCEDURE  Do not eat or drink anything except small amounts of water for 8 to 12 before your surgery, or as directed by your caregiver.  Unless you are told otherwise, continue any eyedrops you have been prescribed.  Talk to your primary caregiver about all other medicines that you take (both prescription and non-prescription). In some cases, you may need to stop or change medicines near the  time of your surgery. This is most important if you are taking blood-thinning medicine.Do not stop medicines unless you are told to do so.  Arrange for someone to drive you to and from the procedure.  Do not put contact lenses in either eye on the day of your surgery. PROCEDURE There is more than one method for safely removing a cataract. Your doctor can explain the differences and help determine which is best for you. Phacoemulsification surgery is the most common form of cataract surgery.  An injection is given behind the eye or eyedrops are given to make this a painless procedure.  A small cut (incision) is made on the edge of the clear, dome-shaped surface that covers the front of the eye (cornea).  A tiny probe is painlessly inserted into the eye. This device gives off ultrasound waves that soften and break up the cloudy center of the lens. This makes it easier for the cloudy lens to be removed by suction.  An IOL may be implanted.  The normal lens of the eye is covered by a clear capsule. Part of that capsule is intentionally left in the eye to support the IOL.  Your surgeon may or may not use stitches to close the incision. There are other forms of cataract surgery that require a larger incision and stiches to close the eye. This approach is taken in cases where the doctor feels that the cataract cannot be easily removed using phacoemulsification. AFTER THE PROCEDURE  When an IOL is implanted, it does not need care. It becomes a permanent part of your eye and cannot be seen or felt.  Your doctor will schedule follow-up exams to check on your progress.  Review your other medicines with your doctor to see which can be resumed after surgery.  Use eyedrops or take medicine as prescribed by your doctor. Document Released: 08/05/2011 Document Revised: 11/08/2011 Document Reviewed: 08/05/2011 [facility] Patient Information 2014 [place], [place].

## 2013-07-30 ENCOUNTER — Encounter (HOSPITAL_COMMUNITY): Payer: Self-pay | Admitting: Pharmacy Technician

## 2013-07-30 ENCOUNTER — Encounter (HOSPITAL_COMMUNITY): Payer: Self-pay

## 2013-07-30 ENCOUNTER — Encounter (HOSPITAL_COMMUNITY)
Admission: RE | Admit: 2013-07-30 | Discharge: 2013-07-30 | Disposition: A | Discharge status: Home / Self Care | Payer: Medicare Other | Source: Ambulatory Visit | Attending: Ophthalmology | Admitting: Ophthalmology

## 2013-07-30 HISTORY — DX: Acute myocardial infarction, unspecified: I21.9

## 2013-07-30 HISTORY — DX: Unspecified glaucoma: H40.9

## 2013-07-30 LAB — HEMOGLOBIN AND HEMATOCRIT, BLOOD
HCT: 43.2 % (ref 39.0–52.0)
Hemoglobin: 14.6 g/dL (ref 13.0–17.0)

## 2013-07-30 LAB — BASIC METABOLIC PANEL
CO2: 23 mEq/L (ref 19–32)
Calcium: 9.4 mg/dL (ref 8.4–10.5)
Chloride: 107 mEq/L (ref 96–112)
Creatinine, Ser: 1.37 mg/dL — ABNORMAL HIGH (ref 0.50–1.35)
GFR calc Af Amer: 56 mL/min — ABNORMAL LOW (ref >90)
Sodium: 142 mEq/L (ref 135–145)

## 2013-07-30 NOTE — Progress Notes (Signed)
07/30/13 1443  OBSTRUCTIVE SLEEP APNEA  Have you ever been diagnosed with sleep apnea through a sleep study? No  Do you snore loudly (loud enough to be heard through closed doors)?  1  Do you often feel tired, fatigued, or sleepy during the daytime? 0  Has anyone observed you stop breathing during your sleep? 0  Do you have, or are you being treated for high blood pressure? 1  BMI more than 35 kg/m2? 0  Age over 76 years old? 1  Neck circumference greater than 40 cm/18 inches? 0  Gender: 1  Obstructive Sleep Apnea Score 4  Score 4 or greater  Results sent to PCP

## 2013-08-02 ENCOUNTER — Other Ambulatory Visit (HOSPITAL_COMMUNITY): Payer: Self-pay | Admitting: Oncology

## 2013-08-02 ENCOUNTER — Encounter (HOSPITAL_COMMUNITY)
Admission: RE | Disposition: A | Discharge status: Home / Self Care | Payer: Self-pay | Source: Ambulatory Visit | Attending: Internal Medicine

## 2013-08-02 ENCOUNTER — Encounter (HOSPITAL_COMMUNITY): Payer: Self-pay | Admitting: *Deleted

## 2013-08-02 ENCOUNTER — Ambulatory Visit (HOSPITAL_COMMUNITY)
Admission: RE | Admit: 2013-08-02 | Discharge: 2013-08-02 | Disposition: A | Discharge status: Home / Self Care | Payer: Medicare Other | Source: Ambulatory Visit | Attending: Internal Medicine | Admitting: Internal Medicine

## 2013-08-02 DIAGNOSIS — D126 Benign neoplasm of colon, unspecified: Secondary | ICD-10-CM

## 2013-08-02 DIAGNOSIS — Z85038 Personal history of other malignant neoplasm of large intestine: Secondary | ICD-10-CM

## 2013-08-02 DIAGNOSIS — K6289 Other specified diseases of anus and rectum: Secondary | ICD-10-CM

## 2013-08-02 DIAGNOSIS — Z1211 Encounter for screening for malignant neoplasm of colon: Secondary | ICD-10-CM

## 2013-08-02 DIAGNOSIS — K5730 Diverticulosis of large intestine without perforation or abscess without bleeding: Secondary | ICD-10-CM

## 2013-08-02 DIAGNOSIS — I10 Essential (primary) hypertension: Secondary | ICD-10-CM | POA: Insufficient documentation

## 2013-08-02 DIAGNOSIS — Z01812 Encounter for preprocedural laboratory examination: Secondary | ICD-10-CM | POA: Insufficient documentation

## 2013-08-02 DIAGNOSIS — D509 Iron deficiency anemia, unspecified: Secondary | ICD-10-CM | POA: Insufficient documentation

## 2013-08-02 DIAGNOSIS — Z01810 Encounter for preprocedural cardiovascular examination: Secondary | ICD-10-CM | POA: Insufficient documentation

## 2013-08-02 HISTORY — PX: COLONOSCOPY: SHX5424

## 2013-08-02 SURGERY — COLONOSCOPY
Anesthesia: Moderate Sedation

## 2013-08-02 MED ORDER — MEPERIDINE HCL 100 MG/ML IJ SOLN
INTRAMUSCULAR | Status: DC | PRN
  Administered 2013-08-02 (×2): 50 mg via INTRAVENOUS

## 2013-08-02 MED ORDER — MIDAZOLAM HCL 5 MG/5ML IJ SOLN
INTRAMUSCULAR | Status: AC
  Filled 2013-08-02: qty 10

## 2013-08-02 MED ORDER — STERILE WATER FOR IRRIGATION IR SOLN
Status: DC | PRN
  Administered 2013-08-02: 13:00:00

## 2013-08-02 MED ORDER — ONDANSETRON HCL 4 MG/2ML IJ SOLN
INTRAMUSCULAR | Status: AC
  Filled 2013-08-02: qty 2

## 2013-08-02 MED ORDER — ONDANSETRON HCL 4 MG/2ML IJ SOLN
INTRAMUSCULAR | Status: DC | PRN
  Administered 2013-08-02: 4 mg via INTRAVENOUS

## 2013-08-02 MED ORDER — SODIUM CHLORIDE 0.9 % IV SOLN
INTRAVENOUS | Status: DC
  Administered 2013-08-02: 13:00:00 via INTRAVENOUS

## 2013-08-02 MED ORDER — MEPERIDINE HCL 100 MG/ML IJ SOLN
INTRAMUSCULAR | Status: AC
  Filled 2013-08-02: qty 2

## 2013-08-02 MED ORDER — MIDAZOLAM HCL 5 MG/5ML IJ SOLN
INTRAMUSCULAR | Status: DC | PRN
  Administered 2013-08-02 (×2): 2 mg via INTRAVENOUS

## 2013-08-02 NOTE — H&P (View-Only) (Signed)
Primary Care Physician:  Louie Boston, MD Primary Gastroenterologist:  Dr. Jena Gauss  Pre-Procedure History & Physical: HPI:  David Pugh is a 78 y.o. male here for a schedule a followup surveillance colonoscopy. 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 Followup colonoscopy in December of last year demonstrated ulcerated anastomosis (biopsies benign) and descending colon adenoma. History of polyp with high-grade dysplasia in the same area where the descending adenoma was removed previously. He's done well. Not having any bowel symptoms. History of radiation proctitis. GERD symptoms well controlled;  borderline delayed gastric emptying treated with diet only. Intermittent nausea likely associated with borderline point gastric emptying also not a problem currently. He is really doing very well overall from a GI standpoint.  Past Medical History  Diagnosis Date  . Premature ventricular contraction   . Prostate cancer 2010  . 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 Radiation Feb 2011    treatment  . Biliary dyskinesia   . GERD (gastroesophageal reflux disease)   . Hiatal hernia 2010    tiny  . HTN (hypertension)   . Rosacea   . PTSD (post-traumatic stress disorder)   . Angina pectoris, unstable   . Diverticula of colon 2010  . Colon polyps     tubular adenomas  . Arthritis   . Exposure to [employer]   . CAD (coronary artery disease)     hx of 2 stents   . Peripheral neuropathy   . Anemia     hx of anemia as a child   . Colon cancer 12/2011    s/p right hemicolectomy, did not tolerate chemo  . Iron deficiency 05/02/2012    followed by Dr. Mariel Sleet, iron infusions.   . Folic acid deficiency   . Heart palpitations 10/2012  . Gastroparesis 04/12/2013    borderline    Past Surgical History  Procedure Laterality Date  . Cholecystectomy    . Prostatectomy    . Tonsillectomy    . Transurethral resection of prostate    .  Esophagogastroduodenoscopy  10/10/2008    Dr. Maceo Pro hiatal hernia, normal esophagus, normal stomach  . Colonoscopy  02/12/2004    Dr. Jena Gauss- L side diverticula, inflammatory  colon polyps  . Esophagogastroduodenoscopy  02/12/2004    Dr. Geni Bers- normal   . Carotid stent  02/14/11  . Coronary stent placement  01/2011    2.5x35mm Xience drug-eluting stent in ramus intermedius/OMI vessel, 2.5x34mm Promus Element stent in mid LAD artery  . 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 Multiple colonic  polyps removed as described above. Sigmoid Diverticulosis/ 1.5 x 2 cm relatively flat ulcerated lesion in cecum/path showed adenocarcinoma of the colon. descending colon polyp with tubular adenoma and one fragment of focal high grade dysplasia.   . Knee surgery      left knee   . Partial colectomy  02/29/2012    Procedure: PARTIAL COLECTOMY;  Surgeon: Ernestene Mention, MD;  Location: [facility];  Service: General;  Laterality: Right;  . Vasectomy    . Cardiac catheterization  02/13/2011    [facility]  . Colonoscopy  08/28/2012    [id number] proctitis. Colonic diverticulosis/Ulcerations at the surgical anastomosis  path: ulcerated colonic mucosa with prolapse changes, tubular adenoma.     Prior to Admission medications   Medication Sig Start Date End Date Taking? Authorizing Provider  acetaminophen (TYLENOL)  650 MG CR tablet Take 1,300 mg by mouth 2 (two) times daily as needed.    Yes Historical Provider, MD  ALPRAZolam Prudy Feeler) 0.25 MG tablet Take 0.25 mg by mouth at bedtime as needed. Anxiety. 03/24/12  Yes Randall An, MD  aspirin 81 MG tablet Take 81 mg by mouth daily.   Yes Historical Provider, MD  B Complex Vitamins (VITAMIN B COMPLEX PO) Take 1 capsule by mouth daily.   Yes Historical Provider, MD  cetirizine (ZYRTEC) 5 MG tablet Take 10 mg by mouth daily.   Yes Historical Provider, MD  citalopram  (CELEXA) 10 MG tablet Take 10 mg by mouth every morning.    Yes Historical Provider, MD  folic acid (FOLVITE) 1 MG tablet Take 1 mg by mouth once a week. 03/27/12  Yes Randall An, MD  latanoprost (XALATAN) 0.005 % ophthalmic solution Place 1 drop into both eyes at bedtime.    Yes Historical Provider, MD  losartan (COZAAR) 50 MG tablet Take 50 mg by mouth every morning.    Yes Historical Provider, MD  metoprolol (LOPRESSOR) 50 MG tablet Take 50 mg by mouth 2 (two) times daily.    Yes Historical Provider, MD  Multiple Vitamin (MULTIVITAMIN WITH MINERALS) TABS Take 1 tablet by mouth daily.   Yes Historical Provider, MD  nitroGLYCERIN (NITROSTAT) 0.4 MG SL tablet Place 0.4 mg under the tongue every 5 (five) minutes x 3 doses as needed. For chest pain   Yes Historical Provider, MD  omeprazole (PRILOSEC) 20 MG capsule Take 2 capsules (40 mg total) by mouth daily. Before breakfast. 07/09/13  Yes Nira Retort, NP  Omeprazole-Sodium Bicarbonate (ZEGERID PO) Take 1 capsule by mouth daily. Does not contain the sodium bicarbonate.   Yes Historical Provider, MD  pravastatin (PRAVACHOL) 40 MG tablet TAKE ONE TABLET BY MOUTH AT BEDTIME. 06/25/13  Yes Mihai Croitoru, MD  Probiotic Product (PROBIOTIC DAILY PO) Take by mouth daily.   Yes Historical Provider, MD    Allergies as of 07/10/2013 - Review Complete 07/10/2013  Allergen Reaction Noted  . Shellfish allergy Anaphylaxis 01/13/2012  . Sulfa antibiotics  12/14/2012  . Sulfonamide derivatives Diarrhea 04/30/2009    Family History  Problem Relation Age of Onset  . Throat cancer Father   . Cancer Father     Throat  . Prostate cancer Brother   . Cancer Brother     Prostate and Skin  . Colon cancer Neg Hx     History   Social History  . Marital Status: Married    Spouse Name: N/A    Number of Children: 3  . Years of Education: N/A   Occupational History  . Retired   .     Social History Main Topics  . Smoking status: Never Smoker   .  Smokeless tobacco: Never Used  . Alcohol Use: Yes     Comment: 1 cocktail/month  . Drug Use: No  . Sexual Activity: Yes    Birth Control/ Protection: None   Other Topics Concern  . Not on file   Social History Narrative  . No narrative on file    Review of Systems: See HPI, otherwise negative ROS  Physical Exam: BP 123/72  Pulse 62  Temp(Src) 97.5 F (36.4 C) (Oral)  Wt 228 lb 3.2 oz (103.511 kg) General:   Alert,  Well-developed, well-nourished, pleasant and cooperative in NAD. Accompanied by spouse. Skin:  Intact without significant lesions or rashes. Eyes:  Sclera clear, no icterus.  Conjunctiva pink. Ears:  Normal auditory acuity. Nose:  No deformity, discharge,  or lesions. Mouth:  No deformity or lesions. Neck:  Supple; no masses or thyromegaly. No significant cervical adenopathy. Lungs:  Clear throughout to auscultation.   No wheezes, crackles, or rhonchi. No acute distress. Heart:  Regular rate and rhythm; no murmurs, clicks, rubs,  or gallops. Abdomen: Non-distended, normal bowel sounds.  Soft and nontender without appreciable mass or hepatosplenomegaly.  Pulses:  Normal pulses noted. Extremities:  Without clubbing or edema.  Impression: 78 year old gentleman with a history of cecal colon carcinoma and recurrent adenomatous polyps. History of anastomotic ulcers of uncertain significance query related to aspirin. He is doing well now;  iron deficiency state  seems to have resolved. I feel he needs somewhat of an early followup interval colonoscopy given the recurrent nature of descending colon polyps.  To this end, I have offered the patient a surveillance colonoscopy at this time.  The risks, benefits, limitations, alternatives and imponderables have been reviewed with the patient. Questions have been answered. All parties are agreeable.  GERD symptoms well controlled on omeprazole. Borderline delayed gastric emptying-asymptomatic; symptoms controlled with diet.

## 2013-08-02 NOTE — Op Note (Signed)
[facility] [address] [place], [address]    COLONOSCOPY PROCEDURE REPORT PATIENT: David Pugh, David Pugh  MR#:         [id number] BIRTHDATE: 1935-08-16 , 78  yrs. old GENDER: Male ENDOSCOPIST: R.  Roetta Sessions, MD FACP FACG REFERRED BY:  Wyvonnia Lora, M.D. PROCEDURE DATE:  08/02/2013 PROCEDURE:     Ileocolonoscopy with snare polypectomy  INDICATIONS: History of carcinoma; surveillance examination  INFORMED CONSENT:  The risks, benefits, alternatives and imponderables including but not limited to bleeding, perforation as well as the possibility of a missed lesion have been reviewed.  The potential for biopsy, lesion removal, etc. have also been discussed.  Questions have been answered.  All parties agreeable. Please see the history and physical in the medical record for more information.  MEDICATIONS: Versed 4 mg IV and Demerol 100 mg IV in divided doses. Cetacaine spray.  Zofran 4 mg IV  DESCRIPTION OF PROCEDURE:  After a digital rectal exam was performed, the EC-[id number] ([id number])  colonoscope was advanced from the anus through the rectum and colon to the area of the cecum, ileocecal valve and appendiceal orifice.  The cecum was deeply intubated.  These structures were well-seen and photographed for the record.  From the level of the cecum and ileocecal valve, the scope was slowly and cautiously withdrawn.  The mucosal surfaces were carefully surveyed utilizing scope tip deflection to facilitate fold flattening as needed.  The scope was pulled down into the rectum where a thorough examination including retroflexion was performed.    FINDINGS:  Adequate preparation. Neovascular changes of the distal rectal mucosa consistent with a prior diagnosis of radiation proctitis; otherwise the remaining rectal mucosal abnormalities. The patient had scattered left-sided transverse diverticula. There were (2) 3 mm polyps-one in the mid sigmoid segment and one in  the mid descending segment; otherwise the residual colonic mucosa appeared normal. The surgical anastomosis with small bowel appeared normal. The distal 10-15 cm of neoterminal ileum also appeared normal.  THERAPEUTIC / DIAGNOSTIC MANEUVERS PERFORMED:  The above-mentioned polyps were hot snare removed  COMPLICATIONS: None  CECAL WITHDRAWAL TIME:  not applicable  IMPRESSION:  Colonic polyps-removed as described above. Colonic diverticulosis. Status post right hemicolectomy. Radiation proctitis-asymptomatic  RECOMMENDATIONS: Followup on pathology.   _______________________________ eSigned:  R. Roetta Sessions, MD FACP [facility] 08/02/2013 2:01 PM   CC:    PATIENT NAME:  David Pugh, David Pugh MR#: [id number]

## 2013-08-02 NOTE — Interval H&P Note (Signed)
History and Physical Interval Note:  08/02/2013 1:05 PM  David Pugh  has presented today for surgery, with the diagnosis of HISTORY OF CECAL CARCINOMA  AND HX OF COLON ADENOMAS  The various methods of treatment have been discussed with the patient and family. After consideration of risks, benefits and other options for treatment, the patient has consented to  Procedure(s) with comments: COLONOSCOPY (N/A) - 1200 as a surgical intervention .  The patient's history has been reviewed, patient examined, no change in status, stable for surgery.  I have reviewed the patient's chart and labs.  Questions were answered to the patient's satisfaction.     No change. Colonoscopy per plan.The risks, benefits, limitations, alternatives and imponderables have been reviewed with the patient. Questions have been answered. All parties are agreeable.   Eula Listen

## 2013-08-05 MED ORDER — LIDOCAINE HCL 3.5 % OP GEL
OPHTHALMIC | Status: AC
  Filled 2013-08-05: qty 1

## 2013-08-05 MED ORDER — CYCLOPENTOLATE-PHENYLEPHRINE OP SOLN OPTIME - NO CHARGE
OPHTHALMIC | Status: AC
  Filled 2013-08-05: qty 2

## 2013-08-05 MED ORDER — LIDOCAINE HCL (PF) 1 % IJ SOLN
INTRAMUSCULAR | Status: AC
  Filled 2013-08-05: qty 2

## 2013-08-05 MED ORDER — NEOMYCIN-POLYMYXIN-DEXAMETH 3.5-10000-0.1 OP SUSP
OPHTHALMIC | Status: AC
  Filled 2013-08-05: qty 5

## 2013-08-05 MED ORDER — PHENYLEPHRINE HCL 2.5 % OP SOLN
OPHTHALMIC | Status: AC
  Filled 2013-08-05: qty 15

## 2013-08-05 MED ORDER — TETRACAINE HCL 0.5 % OP SOLN
OPHTHALMIC | Status: AC
  Filled 2013-08-05: qty 2

## 2013-08-06 ENCOUNTER — Encounter (HOSPITAL_COMMUNITY)
Admission: RE | Disposition: A | Discharge status: Home / Self Care | Payer: Self-pay | Source: Ambulatory Visit | Attending: Ophthalmology

## 2013-08-06 ENCOUNTER — Encounter: Payer: Self-pay | Admitting: Internal Medicine

## 2013-08-06 ENCOUNTER — Encounter (HOSPITAL_COMMUNITY): Payer: Self-pay | Admitting: *Deleted

## 2013-08-06 ENCOUNTER — Ambulatory Visit (HOSPITAL_COMMUNITY): Payer: Medicare Other | Admitting: Anesthesiology

## 2013-08-06 ENCOUNTER — Encounter (HOSPITAL_COMMUNITY): Payer: Medicare Other | Admitting: Anesthesiology

## 2013-08-06 ENCOUNTER — Ambulatory Visit (HOSPITAL_COMMUNITY)
Admission: RE | Admit: 2013-08-06 | Discharge: 2013-08-06 | Disposition: A | Discharge status: Home / Self Care | Payer: Medicare Other | Source: Ambulatory Visit | Attending: Ophthalmology | Admitting: Ophthalmology

## 2013-08-06 DIAGNOSIS — H2510 Age-related nuclear cataract, unspecified eye: Secondary | ICD-10-CM | POA: Insufficient documentation

## 2013-08-06 DIAGNOSIS — I10 Essential (primary) hypertension: Secondary | ICD-10-CM | POA: Insufficient documentation

## 2013-08-06 HISTORY — PX: CATARACT EXTRACTION W/PHACO: SHX586

## 2013-08-06 SURGERY — PHACOEMULSIFICATION, CATARACT, WITH IOL INSERTION
Anesthesia: Monitor Anesthesia Care | Site: Eye | Laterality: Right

## 2013-08-06 MED ORDER — MIDAZOLAM HCL 2 MG/2ML IJ SOLN
INTRAMUSCULAR | Status: AC
  Filled 2013-08-06: qty 2

## 2013-08-06 MED ORDER — NEOMYCIN-POLYMYXIN-DEXAMETH 3.5-10000-0.1 OP SUSP
OPHTHALMIC | Status: DC | PRN
  Administered 2013-08-06: 2 [drp] via OPHTHALMIC

## 2013-08-06 MED ORDER — LACTATED RINGERS IV SOLN
INTRAVENOUS | Status: DC
  Administered 2013-08-06: 1000 mL via INTRAVENOUS

## 2013-08-06 MED ORDER — TETRACAINE HCL 0.5 % OP SOLN
1.0000 [drp] | OPHTHALMIC | Status: AC
  Administered 2013-08-06 (×3): 1 [drp] via OPHTHALMIC

## 2013-08-06 MED ORDER — LIDOCAINE HCL 3.5 % OP GEL
1.0000 "application " | Freq: Once | OPHTHALMIC | Status: AC
  Administered 2013-08-06: 1 via OPHTHALMIC

## 2013-08-06 MED ORDER — BSS IO SOLN
INTRAOCULAR | Status: DC | PRN
  Administered 2013-08-06: 15 mL via INTRAOCULAR

## 2013-08-06 MED ORDER — EPINEPHRINE HCL 1 MG/ML IJ SOLN
INTRAMUSCULAR | Status: AC
  Filled 2013-08-06: qty 1

## 2013-08-06 MED ORDER — CYCLOPENTOLATE-PHENYLEPHRINE 0.2-1 % OP SOLN
1.0000 [drp] | OPHTHALMIC | Status: AC
  Administered 2013-08-06 (×3): 1 [drp] via OPHTHALMIC

## 2013-08-06 MED ORDER — LIDOCAINE HCL (PF) 1 % IJ SOLN
INTRAMUSCULAR | Status: DC | PRN
  Administered 2013-08-06: .5 mL

## 2013-08-06 MED ORDER — PHENYLEPHRINE HCL 2.5 % OP SOLN
1.0000 [drp] | OPHTHALMIC | Status: AC
  Administered 2013-08-06 (×3): 1 [drp] via OPHTHALMIC

## 2013-08-06 MED ORDER — EPINEPHRINE HCL 1 MG/ML IJ SOLN
INTRAOCULAR | Status: DC | PRN
  Administered 2013-08-06: 12:00:00

## 2013-08-06 MED ORDER — FENTANYL CITRATE 0.05 MG/ML IJ SOLN
25.0000 ug | INTRAMUSCULAR | Status: AC
  Administered 2013-08-06 (×2): 25 ug via INTRAVENOUS

## 2013-08-06 MED ORDER — MIDAZOLAM HCL 2 MG/2ML IJ SOLN
1.0000 mg | INTRAMUSCULAR | Status: DC | PRN
  Administered 2013-08-06 (×2): 2 mg via INTRAVENOUS

## 2013-08-06 MED ORDER — FENTANYL CITRATE 0.05 MG/ML IJ SOLN
INTRAMUSCULAR | Status: AC
  Filled 2013-08-06: qty 2

## 2013-08-06 MED ORDER — PROVISC 10 MG/ML IO SOLN
INTRAOCULAR | Status: DC | PRN
  Administered 2013-08-06: 0.85 mL via INTRAOCULAR

## 2013-08-06 SURGICAL SUPPLY — 32 items
CAPSULAR TENSION RING-AMO (OPHTHALMIC RELATED) IMPLANT
CLOTH BEACON ORANGE TIMEOUT ST (SAFETY) ×2 IMPLANT
EYE SHIELD UNIVERSAL CLEAR (GAUZE/BANDAGES/DRESSINGS) ×2 IMPLANT
GLOVE BIO SURGEON STRL SZ 6.5 (GLOVE) IMPLANT
GLOVE BIOGEL PI IND STRL 6.5 (GLOVE) IMPLANT
GLOVE BIOGEL PI IND STRL 7.0 (GLOVE) ×1 IMPLANT
GLOVE BIOGEL PI IND STRL 7.5 (GLOVE) IMPLANT
GLOVE BIOGEL PI INDICATOR 6.5 (GLOVE)
GLOVE BIOGEL PI INDICATOR 7.0 (GLOVE) ×1
GLOVE BIOGEL PI INDICATOR 7.5 (GLOVE)
GLOVE ECLIPSE 6.5 STRL STRAW (GLOVE) IMPLANT
GLOVE ECLIPSE 7.0 STRL STRAW (GLOVE) IMPLANT
GLOVE ECLIPSE 7.5 STRL STRAW (GLOVE) IMPLANT
GLOVE EXAM NITRILE LRG STRL (GLOVE) IMPLANT
GLOVE EXAM NITRILE MD LF STRL (GLOVE) ×2 IMPLANT
GLOVE SKINSENSE NS SZ6.5 (GLOVE)
GLOVE SKINSENSE NS SZ7.0 (GLOVE)
GLOVE SKINSENSE STRL SZ6.5 (GLOVE) IMPLANT
GLOVE SKINSENSE STRL SZ7.0 (GLOVE) IMPLANT
KIT VITRECTOMY (OPHTHALMIC RELATED) IMPLANT
PAD ARMBOARD 7.5X6 YLW CONV (MISCELLANEOUS) ×2 IMPLANT
PROC W NO LENS (INTRAOCULAR LENS)
PROC W SPEC LENS (INTRAOCULAR LENS)
PROCESS W NO LENS (INTRAOCULAR LENS) IMPLANT
PROCESS W SPEC LENS (INTRAOCULAR LENS) IMPLANT
RING MALYGIN (MISCELLANEOUS) IMPLANT
SIGHTPATH CAT PROC W REG LENS (Ophthalmic Related) ×2 IMPLANT
SYR TB 1ML LL NO SAFETY (SYRINGE) ×2 IMPLANT
TAPE SURG TRANSPORE 1 IN (GAUZE/BANDAGES/DRESSINGS) ×1 IMPLANT
TAPE SURGICAL TRANSPORE 1 IN (GAUZE/BANDAGES/DRESSINGS) ×1
VISCOELASTIC ADDITIONAL (OPHTHALMIC RELATED) IMPLANT
WATER STERILE IRR 250ML POUR (IV SOLUTION) ×2 IMPLANT

## 2013-08-06 NOTE — Anesthesia Procedure Notes (Signed)
Procedure Name: MAC Date/Time: 08/06/2013 12:13 PM Performed by: Franco Nones Pre-anesthesia Checklist: Patient identified, Emergency Drugs available, Suction available, Timeout performed and Patient being monitored Patient Re-evaluated:Patient Re-evaluated prior to inductionOxygen Delivery Method: Nasal Cannula

## 2013-08-06 NOTE — Anesthesia Postprocedure Evaluation (Signed)
  Anesthesia Post-op Note  Patient: David Pugh  Procedure(s) Performed: Procedure(s) (LRB): CATARACT EXTRACTION PHACO AND INTRAOCULAR LENS PLACEMENT (IOC) (Right)  Patient Location:  Short Stay  Anesthesia Type: MAC  Level of Consciousness: awake  Airway and Oxygen Therapy: Patient Spontanous Breathing  Post-op Pain: none  Post-op Assessment: Post-op Vital signs reviewed, Patient's Cardiovascular Status Stable, Respiratory Function Stable, Patent Airway, No signs of Nausea or vomiting and Pain level controlled  Post-op Vital Signs: Reviewed and stable  Complications: No apparent anesthesia complications

## 2013-08-06 NOTE — H&P (Signed)
I have reviewed the H&P, the patient was re-examined, and I have identified no interval changes in medical condition and plan of care since the history and physical of record  

## 2013-08-06 NOTE — Anesthesia Preprocedure Evaluation (Signed)
Anesthesia Evaluation  Patient identified by MRN, date of birth, ID band Patient awake    Reviewed: Allergy & Precautions, H&P , NPO status , Patient's Chart, lab work & pertinent test results  Airway Mallampati: II TM Distance: >3 FB Neck ROM: Full    Dental no notable dental hx.    Pulmonary neg pulmonary ROS,  breath sounds clear to auscultation  Pulmonary exam normal       Cardiovascular hypertension, Pt. on home beta blockers and Pt. on medications + angina + CAD and + Cardiac Stents + dysrhythmias Rhythm:Regular Rate:Normal     Neuro/Psych PSYCHIATRIC DISORDERS Agent Orange Exposure. PTSD  Neuromuscular disease    GI/Hepatic Neg liver ROS, hiatal hernia, GERD-  Medicated,  Endo/Other  negative endocrine ROS  Renal/GU negative Renal ROS  negative genitourinary   Musculoskeletal negative musculoskeletal ROS (+)   Abdominal   Peds negative pediatric ROS (+)  Hematology negative hematology ROS (+)   Anesthesia Other Findings   Reproductive/Obstetrics negative OB ROS                           Anesthesia Physical Anesthesia Plan  ASA: III  Anesthesia Plan: MAC   Post-op Pain Management:    Induction: Intravenous  Airway Management Planned: Nasal Cannula  Additional Equipment:   Intra-op Plan:   Post-operative Plan:   Informed Consent: I have reviewed the patients History and Physical, chart, labs and discussed the procedure including the risks, benefits and alternatives for the proposed anesthesia with the patient or authorized representative who has indicated his/her understanding and acceptance.     Plan Discussed with:   Anesthesia Plan Comments:         Anesthesia Quick Evaluation

## 2013-08-06 NOTE — Transfer of Care (Signed)
Immediate Anesthesia Transfer of Care Note  Patient: David Pugh  Procedure(s) Performed: Procedure(s) (LRB): CATARACT EXTRACTION PHACO AND INTRAOCULAR LENS PLACEMENT (IOC) (Right)  Patient Location: Shortstay  Anesthesia Type: MAC  Level of Consciousness: awake  Airway & Oxygen Therapy: Patient Spontanous Breathing   Post-op Assessment: Report given to PACU RN, Post -op Vital signs reviewed and stable and Patient moving all extremities  Post vital signs: Reviewed and stable  Complications: No apparent anesthesia complications

## 2013-08-06 NOTE — Op Note (Signed)
Date of Admission: 08/06/2013  Date of Surgery: 08/06/2013  Pre-Op Dx: Cataract  Right  Eye  Post-Op Dx: Cataract  Right  Eye,  Dx Code 366.16  Surgeon: Gemma Payor, M.D.  Assistants: None  Anesthesia: Topical with MAC  Indications: Painless, progressive loss of vision with compromise of daily activities.  Surgery: Cataract Extraction with Intraocular lens Implant Right Eye  Discription: The patient had dilating drops and viscous lidocaine placed into the right eye in the pre-op holding area. After transfer to the operating room, a time out was performed. The patient was then prepped and draped. Beginning with a 75 degree blade a paracentesis port was made at the surgeon's 2 o'clock position. The anterior chamber was then filled with 1% non-preserved lidocaine. This was followed by filling the anterior chamber with Provisc.  A 2.77mm keratome blade was used to make a clear corneal incision at the temporal limbus.  A bent cystatome needle was used to create a continuous tear capsulotomy. Hydrodissection was performed with balanced salt solution on a Fine canula. The lens nucleus was then removed using the phacoemulsification handpiece. Residual cortex was removed with the I&A handpiece. The anterior chamber and capsular bag were refilled with Provisc. A posterior chamber intraocular lens was placed into the capsular bag with it's injector. The implant was positioned with the Kuglan hook. The Provisc was then removed from the anterior chamber and capsular bag with the I&A handpiece. Stromal hydration of the main incision and paracentesis port was performed with BSS on a Fine canula. The wounds were tested for leak which was negative. The patient tolerated the procedure well. There were no operative complications. The patient was then transferred to the recovery room in stable condition.  Complications: None  Specimen: None  EBL: None  Prosthetic device: B&L enVista, MX60, power 24.5D, SN  [id number].

## 2013-08-07 ENCOUNTER — Encounter (HOSPITAL_COMMUNITY): Payer: Self-pay | Admitting: Internal Medicine

## 2013-08-08 ENCOUNTER — Encounter (HOSPITAL_COMMUNITY): Payer: Self-pay | Admitting: Ophthalmology

## 2013-08-28 ENCOUNTER — Other Ambulatory Visit (HOSPITAL_COMMUNITY): Payer: Self-pay

## 2013-08-31 ENCOUNTER — Encounter (HOSPITAL_COMMUNITY): Payer: Medicare Other | Attending: Oncology

## 2013-08-31 DIAGNOSIS — C182 Malignant neoplasm of ascending colon: Secondary | ICD-10-CM | POA: Insufficient documentation

## 2013-08-31 LAB — CEA: CEA: 1.2 ng/mL (ref 0.0–5.0)

## 2013-08-31 NOTE — Progress Notes (Signed)
Labs drawn today for cea

## 2013-10-26 ENCOUNTER — Encounter: Payer: Self-pay | Admitting: Neurology

## 2013-10-26 ENCOUNTER — Encounter (HOSPITAL_COMMUNITY): Payer: Self-pay | Admitting: Emergency Medicine

## 2013-10-26 ENCOUNTER — Emergency Department (HOSPITAL_COMMUNITY)
Admission: EM | Admit: 2013-10-26 | Discharge: 2013-10-26 | Disposition: A | Discharge status: Home / Self Care | Payer: Medicare Other | Attending: Emergency Medicine | Admitting: Emergency Medicine

## 2013-10-26 DIAGNOSIS — Z860100 Personal history of colon polyps, unspecified: Secondary | ICD-10-CM | POA: Insufficient documentation

## 2013-10-26 DIAGNOSIS — R05 Cough: Secondary | ICD-10-CM | POA: Insufficient documentation

## 2013-10-26 DIAGNOSIS — Z79899 Other long term (current) drug therapy: Secondary | ICD-10-CM | POA: Insufficient documentation

## 2013-10-26 DIAGNOSIS — Z872 Personal history of diseases of the skin and subcutaneous tissue: Secondary | ICD-10-CM | POA: Insufficient documentation

## 2013-10-26 DIAGNOSIS — Z923 Personal history of irradiation: Secondary | ICD-10-CM | POA: Insufficient documentation

## 2013-10-26 DIAGNOSIS — R112 Nausea with vomiting, unspecified: Secondary | ICD-10-CM | POA: Insufficient documentation

## 2013-10-26 DIAGNOSIS — D509 Iron deficiency anemia, unspecified: Secondary | ICD-10-CM | POA: Insufficient documentation

## 2013-10-26 DIAGNOSIS — Z8546 Personal history of malignant neoplasm of prostate: Secondary | ICD-10-CM | POA: Insufficient documentation

## 2013-10-26 DIAGNOSIS — R059 Cough, unspecified: Secondary | ICD-10-CM | POA: Insufficient documentation

## 2013-10-26 DIAGNOSIS — F4310 Post-traumatic stress disorder, unspecified: Secondary | ICD-10-CM | POA: Insufficient documentation

## 2013-10-26 DIAGNOSIS — R197 Diarrhea, unspecified: Secondary | ICD-10-CM | POA: Insufficient documentation

## 2013-10-26 DIAGNOSIS — I2510 Atherosclerotic heart disease of native coronary artery without angina pectoris: Secondary | ICD-10-CM | POA: Insufficient documentation

## 2013-10-26 DIAGNOSIS — I252 Old myocardial infarction: Secondary | ICD-10-CM | POA: Insufficient documentation

## 2013-10-26 DIAGNOSIS — I200 Unstable angina: Secondary | ICD-10-CM | POA: Insufficient documentation

## 2013-10-26 DIAGNOSIS — Z7982 Long term (current) use of aspirin: Secondary | ICD-10-CM | POA: Insufficient documentation

## 2013-10-26 DIAGNOSIS — I10 Essential (primary) hypertension: Secondary | ICD-10-CM | POA: Insufficient documentation

## 2013-10-26 DIAGNOSIS — Z85038 Personal history of other malignant neoplasm of large intestine: Secondary | ICD-10-CM | POA: Insufficient documentation

## 2013-10-26 DIAGNOSIS — Z8669 Personal history of other diseases of the nervous system and sense organs: Secondary | ICD-10-CM | POA: Insufficient documentation

## 2013-10-26 DIAGNOSIS — K219 Gastro-esophageal reflux disease without esophagitis: Secondary | ICD-10-CM | POA: Insufficient documentation

## 2013-10-26 DIAGNOSIS — Z9889 Other specified postprocedural states: Secondary | ICD-10-CM | POA: Insufficient documentation

## 2013-10-26 DIAGNOSIS — Z9861 Coronary angioplasty status: Secondary | ICD-10-CM | POA: Insufficient documentation

## 2013-10-26 DIAGNOSIS — J3489 Other specified disorders of nose and nasal sinuses: Secondary | ICD-10-CM | POA: Insufficient documentation

## 2013-10-26 DIAGNOSIS — M129 Arthropathy, unspecified: Secondary | ICD-10-CM | POA: Insufficient documentation

## 2013-10-26 DIAGNOSIS — Z9089 Acquired absence of other organs: Secondary | ICD-10-CM | POA: Insufficient documentation

## 2013-10-26 LAB — BASIC METABOLIC PANEL
BUN: 14 mg/dL (ref 6–23)
CALCIUM: 8.7 mg/dL (ref 8.4–10.5)
CHLORIDE: 101 meq/L (ref 96–112)
CO2: 27 meq/L (ref 19–32)
Creatinine, Ser: 1.32 mg/dL (ref 0.50–1.35)
GFR calc Af Amer: 59 mL/min — ABNORMAL LOW (ref >90)
GFR calc non Af Amer: 51 mL/min — ABNORMAL LOW (ref >90)
GLUCOSE: 98 mg/dL (ref 70–99)
Potassium: 4 mEq/L (ref 3.7–5.3)
Sodium: 139 mEq/L (ref 137–147)

## 2013-10-26 LAB — CBC WITH DIFFERENTIAL/PLATELET
Basophils Absolute: 0 10*3/uL (ref 0.0–0.1)
Basophils Relative: 0 % (ref 0–1)
EOS ABS: 0.1 10*3/uL (ref 0.0–0.7)
EOS PCT: 1 % (ref 0–5)
HCT: 45.8 % (ref 39.0–52.0)
HEMOGLOBIN: 15.8 g/dL (ref 13.0–17.0)
LYMPHS ABS: 1.8 10*3/uL (ref 0.7–4.0)
LYMPHS PCT: 24 % (ref 12–46)
MCH: 31.7 pg (ref 26.0–34.0)
MCHC: 34.5 g/dL (ref 30.0–36.0)
MCV: 92 fL (ref 78.0–100.0)
MONOS PCT: 17 % — AB (ref 3–12)
Monocytes Absolute: 1.2 10*3/uL — ABNORMAL HIGH (ref 0.1–1.0)
Neutro Abs: 4.2 10*3/uL (ref 1.7–7.7)
Neutrophils Relative %: 58 % (ref 43–77)
PLATELETS: 254 10*3/uL (ref 150–400)
RBC: 4.98 MIL/uL (ref 4.22–5.81)
RDW: 12.5 % (ref 11.5–15.5)
WBC: 7.2 10*3/uL (ref 4.0–10.5)

## 2013-10-26 MED ORDER — SODIUM CHLORIDE 0.9 % IV SOLN
1000.0000 mL | Freq: Once | INTRAVENOUS | Status: AC
  Administered 2013-10-26: 1000 mL via INTRAVENOUS

## 2013-10-26 MED ORDER — METOCLOPRAMIDE HCL 5 MG/ML IJ SOLN
10.0000 mg | Freq: Once | INTRAMUSCULAR | Status: AC
Start: 2013-10-26 — End: 2013-10-26
  Administered 2013-10-26: 10 mg via INTRAVENOUS
  Filled 2013-10-26: qty 2

## 2013-10-26 MED ORDER — METOCLOPRAMIDE HCL 10 MG PO TABS: 10.0000 mg | ORAL_TABLET | Freq: Four times a day (QID) | ORAL | Status: DC | PRN

## 2013-10-26 MED ORDER — SODIUM CHLORIDE 0.9 % IV SOLN
1000.0000 mL | INTRAVENOUS | Status: DC
  Administered 2013-10-26: 1000 mL via INTRAVENOUS

## 2013-10-26 MED ORDER — ONDANSETRON HCL 4 MG/2ML IJ SOLN
4.0000 mg | Freq: Once | INTRAMUSCULAR | Status: DC
  Filled 2013-10-26: qty 2

## 2013-10-26 NOTE — Discharge Instructions (Signed)

## 2013-10-26 NOTE — ED Notes (Signed)
Complain of n/v/d

## 2013-10-26 NOTE — ED Provider Notes (Signed)
CSN: [id number]     Arrival date & time 10/26/13  1022 History  This chart was scribed for David Morn, MD by [name], ED Scribe. This patient was seen in room APA08/APA08 and the patient's care was started 11:29 AM.     Chief Complaint  Patient presents with  . Emesis      The history is provided by the patient. No language interpreter was used.    HPI Comments: David Pugh is a 79 y.o. male with past medical history of GERD, Gastroparesis, who presents to the Emergency Department complaining of 4 days of unchanged episodes of nausea, vomiting, diarrhea. He has an associated fever, TMAX 101 F. He reports having several episodes of vomiting which is yellow in color. Pt spoke with his PCP 2 days ago and was prescribed Zofran without relief. Pt contacted his PCP this morning who advised him to come to the ED. Pt reports decreased fluid intake for the past several days. He denies any episodes of vomiting today due to no food or fluid intake. He reports sick contacts at home with similar symptoms. Pt also reports a productive cough for the past 3 days. He reports associated nasal drainage.no SOB. Hx of chronic bronchitis. Pt denies hematochezia, hematemesis, melena, chest pain, SOB, and abdominal pain.    Past Medical History  Diagnosis Date  . Premature ventricular contraction   . Prostate cancer 2010  . 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 Radiation Feb 2011    treatment  . Biliary dyskinesia   . GERD (gastroesophageal reflux disease)   . Hiatal hernia 2010    tiny  . HTN (hypertension)   . Rosacea   . PTSD (post-traumatic stress disorder)   . Angina pectoris, unstable   . Diverticula of colon 2010  . Colon polyps     tubular adenomas  . Arthritis   . Exposure to [employer]   . CAD (coronary artery disease)     hx of 2 stents   . Peripheral neuropathy   . Anemia     hx of anemia as a child   . Colon cancer 12/2011    s/p right hemicolectomy, did not tolerate  chemo  . Iron deficiency 05/02/2012    followed by Dr. Tressie [name], iron infusions.   . Folic acid deficiency   . Heart palpitations 10/2012  . Gastroparesis 04/12/2013    borderline  . Myocardial infarction 3 yrs ago  . Glaucoma    Past Surgical History  Procedure Laterality Date  . Cholecystectomy    . Prostatectomy    . Tonsillectomy    . Transurethral resection of prostate    . Esophagogastroduodenoscopy  10/10/2008    Dr. Tilda Burrow hiatal hernia, normal esophagus, normal stomach  . Colonoscopy  02/12/2004    Dr. Gala Romney- L side diverticula, inflammatory  colon polyps  . Esophagogastroduodenoscopy  02/12/2004    Dr. Dudley Major- normal   . Coronary stent placement  01/2011    2.5x46mm Xience drug-eluting stent in ramus intermedius/OMI vessel, 2.5x29mm Promus Element stent in mid LAD artery  . 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 Multiple colonic  polyps removed as described above. Sigmoid Diverticulosis/ 1.5 x 2 cm relatively flat ulcerated lesion in cecum/path showed adenocarcinoma of the colon. descending colon polyp with tubular adenoma and one fragment of focal high grade dysplasia.   . Knee surgery  left knee   . Partial colectomy  02/29/2012    Procedure: PARTIAL COLECTOMY;  Surgeon: Adin Hector, MD;  Location: [facility] ORS;  Service: General;  Laterality: Right;  . Vasectomy    . Colonoscopy  08/28/2012    [id number] proctitis. Colonic diverticulosis/Ulcerations at the surgical anastomosis  path: ulcerated colonic mucosa with prolapse changes, tubular adenoma.   . Cardiac catheterization  02/13/2011    [facility], [place] cardiology  . Cataract extraction      2011  . Colonoscopy N/A 08/02/2013    Procedure: COLONOSCOPY;  Surgeon: Daneil Dolin, MD;  Location: [facility] ENDO SUITE;  Service: Endoscopy;  Laterality: N/A;  1200  . Cataract extraction w/phaco Right 08/06/2013    Procedure: CATARACT EXTRACTION  PHACO AND INTRAOCULAR LENS PLACEMENT (IOC);  Surgeon: Tonny Branch, MD;  Location: [facility] ORS;  Service: Ophthalmology;  Laterality: Right;  CDE:20.96   Family History  Problem Relation Age of Onset  . Throat cancer Father   . Cancer Father     Throat  . Prostate cancer Brother   . Cancer Brother     Prostate and Skin  . Colon cancer Neg Hx    History  Substance Use Topics  . Smoking status: Never Smoker   . Smokeless tobacco: Never Used  . Alcohol Use: Yes     Comment: 1 cocktail/month    Review of Systems  A complete 10 system review of systems was obtained and all systems are negative except as noted in the HPI and PMH.     Allergies  Shellfish allergy; Sulfa antibiotics; and Sulfonamide derivatives  Home Medications   Current Outpatient Rx  Name  Route  Sig  Dispense  Refill  . acetaminophen (TYLENOL) 650 MG CR tablet   Oral   Take 650 mg by mouth 2 (two) times daily as needed for pain.          Marland Kitchen ALPRAZolam (XANAX) 0.25 MG tablet   Oral   Take 0.25 mg by mouth at bedtime as needed. Anxiety.         Marland Kitchen aspirin 81 MG tablet   Oral   Take 81 mg by mouth daily.         . B Complex Vitamins (VITAMIN B COMPLEX PO)   Oral   Take 1 tablet by mouth daily.          . cetirizine (ZYRTEC) 10 MG tablet   Oral   Take 10 mg by mouth daily.         . citalopram (CELEXA) 10 MG tablet   Oral   Take 10 mg by mouth every morning.          . folic acid (FOLVITE) 1 MG tablet   Oral   Take 1 mg by mouth once a week. Sunday.         . latanoprost (XALATAN) 0.005 % ophthalmic solution   Both Eyes   Place 1 drop into both eyes at bedtime.          Marland Kitchen losartan (COZAAR) 50 MG tablet   Oral   Take 25 mg by mouth 2 (two) times daily.          . metoprolol (LOPRESSOR) 50 MG tablet   Oral   Take 50 mg by mouth 2 (two) times daily.          . Multiple Vitamin (MULTIVITAMIN WITH MINERALS) TABS tablet   Oral   Take 1 tablet by mouth daily.         Marland Kitchen  nitroGLYCERIN (NITROSTAT) 0.4 MG SL tablet   Sublingual   Place 0.4 mg under the tongue every 5 (five) minutes x 3 doses as needed. For chest pain         . omeprazole (PRILOSEC) 20 MG capsule   Oral   Take 2 capsules (40 mg total) by mouth daily. Before breakfast.   60 capsule   11   . ondansetron (ZOFRAN) 8 MG tablet   Oral   Take 8 mg by mouth every 8 (eight) hours as needed for nausea or vomiting.         . pravastatin (PRAVACHOL) 40 MG tablet   Oral   Take 40 mg by mouth at bedtime.          . Probiotic Product (PROBIOTIC DAILY PO)   Oral   Take 1 tablet by mouth daily.           BP 120/84  Pulse 88  Temp(Src) 98.4 F (36.9 C) (Oral)  Resp 20  Ht 5\' 11"  (1.803 m)  Wt 222 lb (100.699 kg)  BMI 30.98 kg/m2  SpO2 96% Physical Exam  Nursing note and vitals reviewed. Constitutional: He is oriented to person, place, and time. He appears well-developed and well-nourished.  HENT:  Head: Normocephalic and atraumatic.  Eyes: EOM are normal.  Neck: Normal range of motion.  Cardiovascular: Normal rate, regular rhythm, normal heart sounds and intact distal pulses.   Pulmonary/Chest: Effort normal. No respiratory distress. He has no wheezes. He has rhonchi. He has no rales. He exhibits no tenderness.  Abdominal: Soft. He exhibits no distension. There is no tenderness.  Musculoskeletal: Normal range of motion.  Neurological: He is alert and oriented to person, place, and time.  Skin: Skin is warm and dry.  Psychiatric: He has a normal mood and affect. Judgment normal.    ED Course  Procedures (including critical care time)  DIAGNOSTIC STUDIES: Oxygen Saturation is 96% on RA, Adequate by my interpretation.    COORDINATION OF CARE: 11:22 AM Discussed treatment plan with pt at bedside and pt agreed to plan.   Labs Review Labs Reviewed  CBC WITH DIFFERENTIAL - Abnormal; Notable for the following:    Monocytes Relative 17 (*)    Monocytes Absolute 1.2 (*)    All  other components within normal limits  BASIC METABOLIC PANEL - Abnormal; Notable for the following:    GFR calc non Af Amer 51 (*)    GFR calc Af Amer 59 (*)    All other components within normal limits   Imaging Review No results found.  MDM   Final diagnoses:  None   2:06 PM Pt feels much better at this time. Dc home. Home with reglan. abd benign  I personally performed the services described in this documentation, which was scribed in my presence. The recorded information has been reviewed and is accurate.          David Morn, MD 10/26/13 [phone]

## 2013-10-26 NOTE — ED Notes (Signed)
Pt states has had V/D since Tues, last emesis 4am, approx 3 stools per day, loose.

## 2013-10-29 ENCOUNTER — Telehealth: Payer: Self-pay | Admitting: Neurology

## 2013-10-29 ENCOUNTER — Ambulatory Visit: Payer: TRICARE For Life (TFL) | Admitting: Neurology

## 2013-10-29 NOTE — Telephone Encounter (Signed)
This patient did not show for a new patient appointment today. 

## 2013-11-24 ENCOUNTER — Encounter (HOSPITAL_COMMUNITY): Payer: Self-pay | Admitting: Emergency Medicine

## 2013-11-24 ENCOUNTER — Observation Stay (HOSPITAL_COMMUNITY)
Admission: EM | Admit: 2013-11-24 | Discharge: 2013-11-26 | DRG: 392 | Disposition: A | Discharge status: Home / Self Care | Payer: Medicare Other | Attending: Internal Medicine | Admitting: Internal Medicine

## 2013-11-24 DIAGNOSIS — Z9861 Coronary angioplasty status: Secondary | ICD-10-CM

## 2013-11-24 DIAGNOSIS — Z923 Personal history of irradiation: Secondary | ICD-10-CM

## 2013-11-24 DIAGNOSIS — L719 Rosacea, unspecified: Secondary | ICD-10-CM | POA: Diagnosis present

## 2013-11-24 DIAGNOSIS — Z9049 Acquired absence of other specified parts of digestive tract: Secondary | ICD-10-CM

## 2013-11-24 DIAGNOSIS — I2510 Atherosclerotic heart disease of native coronary artery without angina pectoris: Secondary | ICD-10-CM | POA: Diagnosis present

## 2013-11-24 DIAGNOSIS — H409 Unspecified glaucoma: Secondary | ICD-10-CM | POA: Diagnosis present

## 2013-11-24 DIAGNOSIS — D72829 Elevated white blood cell count, unspecified: Secondary | ICD-10-CM | POA: Diagnosis present

## 2013-11-24 DIAGNOSIS — E860 Dehydration: Secondary | ICD-10-CM | POA: Diagnosis present

## 2013-11-24 DIAGNOSIS — A088 Other specified intestinal infections: Principal | ICD-10-CM | POA: Diagnosis present

## 2013-11-24 DIAGNOSIS — M129 Arthropathy, unspecified: Secondary | ICD-10-CM | POA: Diagnosis present

## 2013-11-24 DIAGNOSIS — Z860100 Personal history of colon polyps, unspecified: Secondary | ICD-10-CM

## 2013-11-24 DIAGNOSIS — R197 Diarrhea, unspecified: Secondary | ICD-10-CM

## 2013-11-24 DIAGNOSIS — I10 Essential (primary) hypertension: Secondary | ICD-10-CM | POA: Diagnosis present

## 2013-11-24 DIAGNOSIS — Z9189 Other specified personal risk factors, not elsewhere classified: Secondary | ICD-10-CM

## 2013-11-24 DIAGNOSIS — I252 Old myocardial infarction: Secondary | ICD-10-CM

## 2013-11-24 DIAGNOSIS — K219 Gastro-esophageal reflux disease without esophagitis: Secondary | ICD-10-CM | POA: Diagnosis present

## 2013-11-24 DIAGNOSIS — C182 Malignant neoplasm of ascending colon: Secondary | ICD-10-CM | POA: Diagnosis present

## 2013-11-24 DIAGNOSIS — K3184 Gastroparesis: Secondary | ICD-10-CM | POA: Diagnosis present

## 2013-11-24 DIAGNOSIS — E538 Deficiency of other specified B group vitamins: Secondary | ICD-10-CM

## 2013-11-24 DIAGNOSIS — E611 Iron deficiency: Secondary | ICD-10-CM

## 2013-11-24 DIAGNOSIS — I4949 Other premature depolarization: Secondary | ICD-10-CM

## 2013-11-24 DIAGNOSIS — R112 Nausea with vomiting, unspecified: Secondary | ICD-10-CM

## 2013-11-24 DIAGNOSIS — G609 Hereditary and idiopathic neuropathy, unspecified: Secondary | ICD-10-CM | POA: Diagnosis present

## 2013-11-24 DIAGNOSIS — A084 Viral intestinal infection, unspecified: Secondary | ICD-10-CM | POA: Diagnosis present

## 2013-11-24 DIAGNOSIS — G629 Polyneuropathy, unspecified: Secondary | ICD-10-CM | POA: Diagnosis present

## 2013-11-24 DIAGNOSIS — F4310 Post-traumatic stress disorder, unspecified: Secondary | ICD-10-CM | POA: Diagnosis present

## 2013-11-24 DIAGNOSIS — R079 Chest pain, unspecified: Secondary | ICD-10-CM

## 2013-11-24 DIAGNOSIS — Z8546 Personal history of malignant neoplasm of prostate: Secondary | ICD-10-CM

## 2013-11-24 DIAGNOSIS — E785 Hyperlipidemia, unspecified: Secondary | ICD-10-CM

## 2013-11-24 DIAGNOSIS — R7309 Other abnormal glucose: Secondary | ICD-10-CM | POA: Diagnosis present

## 2013-11-24 DIAGNOSIS — Z8042 Family history of malignant neoplasm of prostate: Secondary | ICD-10-CM

## 2013-11-24 LAB — URINALYSIS, ROUTINE W REFLEX MICROSCOPIC
Bilirubin Urine: NEGATIVE
Glucose, UA: NEGATIVE mg/dL
Hgb urine dipstick: NEGATIVE
LEUKOCYTES UA: NEGATIVE
NITRITE: NEGATIVE
PH: 5.5 (ref 5.0–8.0)
Protein, ur: NEGATIVE mg/dL
Specific Gravity, Urine: >1.03 — ABNORMAL HIGH (ref 1.005–1.030)
UROBILINOGEN UA: 0.2 mg/dL (ref 0.0–1.0)

## 2013-11-24 LAB — COMPREHENSIVE METABOLIC PANEL
ALT: 22 U/L (ref 0–53)
AST: 26 U/L (ref 0–37)
Albumin: 4.2 g/dL (ref 3.5–5.2)
Alkaline Phosphatase: 68 U/L (ref 39–117)
BUN: 17 mg/dL (ref 6–23)
CALCIUM: 9.5 mg/dL (ref 8.4–10.5)
CO2: 23 meq/L (ref 19–32)
CREATININE: 1.24 mg/dL (ref 0.50–1.35)
Chloride: 103 mEq/L (ref 96–112)
GFR calc Af Amer: 63 mL/min — ABNORMAL LOW (ref >90)
GFR, EST NON AFRICAN AMERICAN: 55 mL/min — AB (ref >90)
GLUCOSE: 156 mg/dL — AB (ref 70–99)
Potassium: 4.1 mEq/L (ref 3.7–5.3)
Sodium: 143 mEq/L (ref 137–147)
TOTAL PROTEIN: 7.8 g/dL (ref 6.0–8.3)
Total Bilirubin: 1.2 mg/dL (ref 0.3–1.2)

## 2013-11-24 LAB — CBC WITH DIFFERENTIAL/PLATELET
Basophils Absolute: 0 10*3/uL (ref 0.0–0.1)
Basophils Relative: 0 % (ref 0–1)
EOS ABS: 0.1 10*3/uL (ref 0.0–0.7)
Eosinophils Relative: 0 % (ref 0–5)
HEMATOCRIT: 49.2 % (ref 39.0–52.0)
Hemoglobin: 16.7 g/dL (ref 13.0–17.0)
LYMPHS ABS: 0.6 10*3/uL — AB (ref 0.7–4.0)
LYMPHS PCT: 3 % — AB (ref 12–46)
MCH: 31.3 pg (ref 26.0–34.0)
MCHC: 33.9 g/dL (ref 30.0–36.0)
MCV: 92.3 fL (ref 78.0–100.0)
MONO ABS: 1.1 10*3/uL — AB (ref 0.1–1.0)
Monocytes Relative: 5 % (ref 3–12)
Neutro Abs: 18.7 10*3/uL — ABNORMAL HIGH (ref 1.7–7.7)
Neutrophils Relative %: 91 % — ABNORMAL HIGH (ref 43–77)
PLATELETS: 313 10*3/uL (ref 150–400)
RBC: 5.33 MIL/uL (ref 4.22–5.81)
RDW: 13.3 % (ref 11.5–15.5)
WBC: 20.5 10*3/uL — AB (ref 4.0–10.5)

## 2013-11-24 LAB — CLOSTRIDIUM DIFFICILE BY PCR: Toxigenic C. Difficile by PCR: NEGATIVE

## 2013-11-24 MED ORDER — DIPHENHYDRAMINE HCL 50 MG/ML IJ SOLN
25.0000 mg | Freq: Once | INTRAMUSCULAR | Status: AC
  Administered 2013-11-24: 25 mg via INTRAVENOUS
  Filled 2013-11-24: qty 1

## 2013-11-24 MED ORDER — SODIUM CHLORIDE 0.9 % IV SOLN
1000.0000 mL | INTRAVENOUS | Status: DC
  Administered 2013-11-24: 1000 mL via INTRAVENOUS

## 2013-11-24 MED ORDER — ALBUTEROL SULFATE (2.5 MG/3ML) 0.083% IN NEBU: 2.5000 mg | INHALATION_SOLUTION | RESPIRATORY_TRACT | Status: DC | PRN

## 2013-11-24 MED ORDER — LATANOPROST 0.005 % OP SOLN
1.0000 [drp] | Freq: Every day | OPHTHALMIC | Status: DC
  Administered 2013-11-25: 1 [drp] via OPHTHALMIC
  Filled 2013-11-24: qty 2.5

## 2013-11-24 MED ORDER — LOPERAMIDE HCL 2 MG PO CAPS
4.0000 mg | ORAL_CAPSULE | Freq: Once | ORAL | Status: AC
  Administered 2013-11-24: 4 mg via ORAL
  Filled 2013-11-24: qty 2

## 2013-11-24 MED ORDER — ONDANSETRON HCL 4 MG/2ML IJ SOLN
4.0000 mg | Freq: Once | INTRAMUSCULAR | Status: AC
  Administered 2013-11-24: 4 mg via INTRAVENOUS
  Filled 2013-11-24: qty 2

## 2013-11-24 MED ORDER — ONDANSETRON HCL 4 MG/2ML IJ SOLN
4.0000 mg | Freq: Four times a day (QID) | INTRAMUSCULAR | Status: DC | PRN
  Administered 2013-11-25 (×2): 4 mg via INTRAVENOUS
  Filled 2013-11-24 (×2): qty 2

## 2013-11-24 MED ORDER — METOPROLOL TARTRATE 50 MG PO TABS
50.0000 mg | ORAL_TABLET | Freq: Two times a day (BID) | ORAL | Status: DC
  Administered 2013-11-24 – 2013-11-26 (×4): 50 mg via ORAL
  Filled 2013-11-24 (×4): qty 1

## 2013-11-24 MED ORDER — METOCLOPRAMIDE HCL 5 MG/ML IJ SOLN
10.0000 mg | Freq: Once | INTRAMUSCULAR | Status: AC
  Administered 2013-11-24: 10 mg via INTRAVENOUS
  Filled 2013-11-24: qty 2

## 2013-11-24 MED ORDER — ALPRAZOLAM 0.25 MG PO TABS: 0.2500 mg | ORAL_TABLET | Freq: Every evening | ORAL | Status: DC | PRN

## 2013-11-24 MED ORDER — SODIUM CHLORIDE 0.9 % IV BOLUS (SEPSIS)
1000.0000 mL | Freq: Once | INTRAVENOUS | Status: AC
  Administered 2013-11-24: 1000 mL via INTRAVENOUS

## 2013-11-24 MED ORDER — ACETAMINOPHEN 325 MG PO TABS: 650.0000 mg | ORAL_TABLET | Freq: Four times a day (QID) | ORAL | Status: DC | PRN

## 2013-11-24 MED ORDER — ASPIRIN EC 81 MG PO TBEC
81.0000 mg | DELAYED_RELEASE_TABLET | Freq: Every day | ORAL | Status: DC
  Administered 2013-11-25 – 2013-11-26 (×2): 81 mg via ORAL
  Filled 2013-11-24 (×2): qty 1

## 2013-11-24 MED ORDER — SODIUM CHLORIDE 0.9 % IV SOLN: INTRAVENOUS | Status: DC

## 2013-11-24 MED ORDER — ACETAMINOPHEN 650 MG RE SUPP: 650.0000 mg | Freq: Four times a day (QID) | RECTAL | Status: DC | PRN

## 2013-11-24 MED ORDER — PANTOPRAZOLE SODIUM 40 MG PO TBEC
40.0000 mg | DELAYED_RELEASE_TABLET | Freq: Every day | ORAL | Status: DC
  Administered 2013-11-25 – 2013-11-26 (×2): 40 mg via ORAL
  Filled 2013-11-24 (×2): qty 1

## 2013-11-24 MED ORDER — SODIUM CHLORIDE 0.9 % IV SOLN
1000.0000 mL | Freq: Once | INTRAVENOUS | Status: AC
Start: 2013-11-24 — End: 2013-11-24
  Administered 2013-11-24: 1000 mL via INTRAVENOUS

## 2013-11-24 MED ORDER — LATANOPROST 0.005 % OP SOLN
OPHTHALMIC | Status: AC
  Filled 2013-11-24: qty 2.5

## 2013-11-24 MED ORDER — ONDANSETRON 4 MG PO TBDP: 4.0000 mg | ORAL_TABLET | Freq: Three times a day (TID) | ORAL | Status: DC | PRN

## 2013-11-24 MED ORDER — LOPERAMIDE HCL 2 MG PO CAPS
2.0000 mg | ORAL_CAPSULE | Freq: Three times a day (TID) | ORAL | Status: DC | PRN
  Administered 2013-11-25: 2 mg via ORAL
  Filled 2013-11-24: qty 1

## 2013-11-24 MED ORDER — LOSARTAN POTASSIUM 50 MG PO TABS
25.0000 mg | ORAL_TABLET | Freq: Two times a day (BID) | ORAL | Status: DC
  Administered 2013-11-25 – 2013-11-26 (×3): 25 mg via ORAL
  Filled 2013-11-24 (×3): qty 1

## 2013-11-24 MED ORDER — CITALOPRAM HYDROBROMIDE 20 MG PO TABS
10.0000 mg | ORAL_TABLET | Freq: Every morning | ORAL | Status: DC
  Administered 2013-11-25 – 2013-11-26 (×2): 10 mg via ORAL
  Filled 2013-11-24 (×2): qty 1

## 2013-11-24 MED ORDER — SIMVASTATIN 20 MG PO TABS
20.0000 mg | ORAL_TABLET | Freq: Every day | ORAL | Status: DC
  Administered 2013-11-25: 20 mg via ORAL
  Filled 2013-11-24: qty 1

## 2013-11-24 MED ORDER — ONDANSETRON HCL 4 MG PO TABS: 4.0000 mg | ORAL_TABLET | Freq: Four times a day (QID) | ORAL | Status: DC | PRN

## 2013-11-24 NOTE — ED Notes (Signed)
Pt assisted to bathroom. Pt reports no more emesis or diarrhea.

## 2013-11-24 NOTE — ED Notes (Signed)
Pt states that he woke up this am with n/v/d, abd soreness from vomiting,

## 2013-11-24 NOTE — ED Notes (Signed)
Patient states he does not feel nauseated and was able to drink Ginger ale without any problems. Patient states he feels better. RN made aware.

## 2013-11-24 NOTE — ED Notes (Signed)
Patient given ginger ale at this time to see how he does.

## 2013-11-24 NOTE — H&P (Signed)
[facility] History and Physical  [name]:[id number] DOB: 08-22-1935 DOA: 11/24/2013   PCP: [name], MD  Specialists: He is followed by oncology at the [facility] for history of colon cancer. His cardiologist is Dr. Sallyanne Kuster.  Chief Complaint: Nausea, vomiting, and diarrhea since 4 AM  HPI: David Pugh is a 79 y.o. male with a past medical history of coronary artery disease with stent placement to Ramus intermedius, and left anterior descending in June of 2012, history of colon cancer diagnosed in 2013 status post right hemicolectomy, hypertension, peripheral neuropathy, prostate cancer status post radiation treatment, who was in his usual state of health till about 4 AM this morning when he woke up feeling nauseous. He subsequently started vomiting. And, then also started having diarrhea. He has had innumerable episodes of watery stool. He has noticed small amount of blood in the stool as well, but predominantly it has been yellow in color. He does have a history of hemorrhoids. The emesis has also been yellowish and bilious in nature. Had about 10-11 episodes of same. Denies any blood in the emesis. He had chills, but his temperature was normal at home this morning. His family members, including his wife and mother-in-law were affected with the flu about 2 weeks ago. On Friday he went to a [employer] for lunch. Last week he had had a dental procedure for which he was given antibiotics and he finished taking that medication on Monday. He does not know the name of this antibiotic. Patient has been weak, but denies any dizziness or lightheadedness. Denies any abdominal pain per say. He does have a history of chronic back pain and as a result of numerous episodes of vomiting he has noted worsening pain in the back. Since the symptoms were not improving, he decided to come in to the hospital. Denies taking any medications at home for same.  Home  Medications: Prior to Admission medications   Medication Sig Start Date End Date Taking? Authorizing Provider  acetaminophen (TYLENOL) 650 MG CR tablet Take 650 mg by mouth 2 (two) times daily as needed for pain.    Yes Historical Provider, MD  ALPRAZolam Duanne Moron) 0.25 MG tablet Take 0.25 mg by mouth at bedtime as needed. Anxiety. 03/24/12  Yes Pieter Partridge, MD  aspirin EC 81 MG tablet Take 81 mg by mouth daily.   Yes Historical Provider, MD  B Complex Vitamins (VITAMIN B COMPLEX PO) Take 1 tablet by mouth daily.    Yes Historical Provider, MD  cetirizine (ZYRTEC) 10 MG tablet Take 10 mg by mouth daily.   Yes Historical Provider, MD  citalopram (CELEXA) 10 MG tablet Take 10 mg by mouth every morning.    Yes Historical Provider, MD  latanoprost (XALATAN) 0.005 % ophthalmic solution Place 1 drop into both eyes at bedtime.    Yes Historical Provider, MD  losartan (COZAAR) 50 MG tablet Take 25 mg by mouth 2 (two) times daily.    Yes Historical Provider, MD  metoCLOPramide (REGLAN) 10 MG tablet Take 1 tablet (10 mg total) by mouth every 6 (six) hours as needed for nausea or vomiting. 10/26/13  Yes [name], MD  metoprolol (LOPRESSOR) 50 MG tablet Take 50 mg by mouth 2 (two) times daily.    Yes Historical Provider, MD  Multiple Vitamin (MULTIVITAMIN WITH MINERALS) TABS tablet Take 1 tablet by mouth daily.   Yes Historical Provider, MD  omeprazole (PRILOSEC) 40 MG capsule Take 40 mg by mouth  daily.   Yes Historical Provider, MD  pravastatin (PRAVACHOL) 40 MG tablet Take 40 mg by mouth at bedtime.    Yes Historical Provider, MD  Probiotic Product (PROBIOTIC DAILY PO) Take 1 tablet by mouth daily.    Yes Historical Provider, MD  folic acid (FOLVITE) 1 MG tablet Take 1 mg by mouth once a week. Sunday. 03/27/12   Pieter Partridge, MD  nitroGLYCERIN (NITROSTAT) 0.4 MG SL tablet Place 0.4 mg under the tongue every 5 (five) minutes x 3 doses as needed. For chest pain    Historical Provider, MD   ondansetron (ZOFRAN) 8 MG tablet Take 8 mg by mouth every 8 (eight) hours as needed for nausea or vomiting.    Historical Provider, MD    Allergies:  Allergies  Allergen Reactions  . Shellfish Allergy Anaphylaxis  . Sulfa Antibiotics Diarrhea  . Sulfonamide Derivatives Diarrhea    Past Medical History: Past Medical History  Diagnosis Date  . Premature ventricular contraction   . Prostate cancer 2010  . 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 Radiation Feb 2011    treatment  . Biliary dyskinesia   . GERD (gastroesophageal reflux disease)   . Hiatal hernia 2010    tiny  . HTN (hypertension)   . Rosacea   . PTSD (post-traumatic stress disorder)   . Angina pectoris, unstable   . Diverticula of colon 2010  . Colon polyps     tubular adenomas  . Arthritis   . Exposure to [employer]   . CAD (coronary artery disease)     hx of 2 stents   . Peripheral neuropathy   . Anemia     hx of anemia as a child   . Colon cancer 12/2011    s/p right hemicolectomy, did not tolerate chemo  . Iron deficiency 05/02/2012    followed by Dr. Tressie [name], iron infusions.   . Folic acid deficiency   . Heart palpitations 10/2012  . Gastroparesis 04/12/2013    borderline  . Myocardial infarction 3 yrs ago  . Glaucoma     Past Surgical History  Procedure Laterality Date  . Cholecystectomy    . Prostatectomy    . Tonsillectomy    . Transurethral resection of prostate    . Esophagogastroduodenoscopy  10/10/2008    Dr. Tilda Burrow hiatal hernia, normal esophagus, normal stomach  . Colonoscopy  02/12/2004    Dr. Gala Romney- L side diverticula, inflammatory  colon polyps  . Esophagogastroduodenoscopy  02/12/2004    Dr. Dudley Major- normal   . Coronary stent placement  01/2011    2.5x5m Xience drug-eluting stent in ramus intermedius/OMI vessel, 2.5x668mPromus Element stent in mid LAD artery  . Colonoscopy  01/19/2012    RMR: Prominent changes involving the rectal mucosa consistent with radiation-induced  proctitis. Multiple colonic  polyps removed as described above. Sigmoid Diverticulosis/ 1.5 x 2 cm relatively flat ulcerated lesion in cecum/path showed adenocarcinoma of the colon. descending colon polyp with tubular adenoma and one fragment of focal high grade dysplasia.   . Knee surgery      left knee   . Partial colectomy  02/29/2012    Procedure: PARTIAL COLECTOMY;  Surgeon: HaAdin HectorMD;  Location: [facility]S;  Service: General;  Laterality: Right;  . Vasectomy    . Colonoscopy  08/28/2012    RMOYD:XAJOINOMVroctitis. Colonic diverticulosis/Ulcerations at the surgical anastomosis  path: ulcerated colonic mucosa with prolapse changes, tubular adenoma.   . Cardiac catheterization  02/13/2011    [facility] cardiology  . Cataract extraction      2011  . Colonoscopy N/A 08/02/2013    Procedure: COLONOSCOPY;  Surgeon: Daneil Dolin, MD;  Location: [facility] SUITE;  Service: Endoscopy;  Laterality: N/A;  1200  . Cataract extraction w/phaco Right 08/06/2013    Procedure: CATARACT EXTRACTION PHACO AND INTRAOCULAR LENS PLACEMENT (IOC);  Surgeon: Tonny Branch, MD;  Location: [facility] ORS;  Service: Ophthalmology;  Laterality: Right;  CDE:20.96    Social History: Patient lives in [place] with his wife and mother-in-law. No smoking. Occasional alcohol use. No illicit drug use. Independent with daily activities.  Family History:  Family History  Problem Relation Age of Onset  . Throat cancer Father   . Cancer Father     Throat  . Prostate cancer Brother   . Cancer Brother     Prostate and Skin  . Colon cancer Neg Hx      Review of Systems - History obtained from the patient General ROS: positive for  - fatigue Psychological ROS: negative Ophthalmic ROS: negative ENT ROS: negative Allergy and Immunology ROS: negative Hematological and Lymphatic ROS: negative Endocrine ROS: negative Respiratory ROS: no cough, shortness of breath, or wheezing Cardiovascular ROS: no chest pain or dyspnea  on exertion Gastrointestinal ROS: as in hpi Genito-Urinary ROS: no dysuria, trouble voiding, or hematuria Musculoskeletal ROS: negative Neurological ROS: no TIA or stroke symptoms Dermatological ROS: negative  Physical Examination  Filed Vitals:   11/24/13 1437 11/24/13 1733  BP: 136/76 109/57  Pulse: 110 92  Temp: 98.6 F (37 C)   TempSrc: Oral   Resp: 16 12  SpO2: 100% 100%    BP 109/57  Pulse 92  Temp(Src) 98.6 F (37 C) (Oral)  Resp 12  SpO2 100%  General appearance: alert, cooperative, appears stated age and no distress Head: Normocephalic, without obvious abnormality, atraumatic Eyes: conjunctivae/corneas clear. PERRL, EOM's intact. Throat: dry mm Neck: no adenopathy, no carotid bruit, no JVD, supple, symmetrical, trachea midline and thyroid not enlarged, symmetric, no tenderness/mass/nodules Resp: clear to auscultation bilaterally Cardio: regular rate and rhythm, S1, S2 normal, no murmur, click, rub or gallop GI: soft, non-tender; bowel sounds normal; no masses,  no organomegaly Extremities: extremities normal, atraumatic, no cyanosis or edema Pulses: 2+ and symmetric Skin: Skin color, texture, turgor normal. No rashes or lesions Lymph nodes: Cervical, supraclavicular, and axillary nodes normal. Neurologic: Alert and oriented x3. No focal neurological deficits are present.  Laboratory Data: Results for orders placed during the hospital encounter of 11/24/13 (from the past 48 hour(s))  CBC WITH DIFFERENTIAL     Status: Abnormal   Collection Time    11/24/13  3:20 PM      Result Value Ref Range   WBC 20.5 (*) 4.0 - 10.5 K/uL   RBC 5.33  4.22 - 5.81 MIL/uL   Hemoglobin 16.7  13.0 - 17.0 g/dL   HCT 49.2  39.0 - 52.0 %   MCV 92.3  78.0 - 100.0 fL   MCH 31.3  26.0 - 34.0 pg   MCHC 33.9  30.0 - 36.0 g/dL   RDW 13.3  11.5 - 15.5 %   Platelets 313  150 - 400 K/uL   Neutrophils Relative % 91 (*) 43 - 77 %   Neutro Abs 18.7 (*) 1.7 - 7.7 K/uL   Lymphocytes  Relative 3 (*) 12 - 46 %   Lymphs Abs 0.6 (*) 0.7 - 4.0 K/uL   Monocytes Relative 5  3 - 12 %   Monocytes Absolute 1.1 (*) 0.1 - 1.0 K/uL   Eosinophils Relative 0  0 - 5 %   Eosinophils Absolute 0.1  0.0 - 0.7 K/uL   Basophils Relative 0  0 - 1 %   Basophils Absolute 0.0  0.0 - 0.1 K/uL  COMPREHENSIVE METABOLIC PANEL     Status: Abnormal   Collection Time    11/24/13  3:20 PM      Result Value Ref Range   Sodium 143  137 - 147 mEq/L   Potassium 4.1  3.7 - 5.3 mEq/L   Chloride 103  96 - 112 mEq/L   CO2 23  19 - 32 mEq/L   Glucose, Bld 156 (*) 70 - 99 mg/dL   BUN 17  6 - 23 mg/dL   Creatinine, Ser 1.24  0.50 - 1.35 mg/dL   Calcium 9.5  8.4 - 10.5 mg/dL   Total Protein 7.8  6.0 - 8.3 g/dL   Albumin 4.2  3.5 - 5.2 g/dL   AST 26  0 - 37 U/L   ALT 22  0 - 53 U/L   Alkaline Phosphatase 68  39 - 117 U/L   Total Bilirubin 1.2  0.3 - 1.2 mg/dL   GFR calc non Af Amer 55 (*) >90 mL/min   GFR calc Af Amer 63 (*) >90 mL/min   Comment: (NOTE)     The eGFR has been calculated using the CKD EPI equation.     This calculation has not been validated in all clinical situations.     eGFR's persistently <90 mL/min signify possible Chronic Kidney     Disease.  URINALYSIS, ROUTINE W REFLEX MICROSCOPIC     Status: Abnormal   Collection Time    11/24/13  5:30 PM      Result Value Ref Range   Color, Urine ORANGE (*) YELLOW   Comment: BIOCHEMICALS MAY BE AFFECTED BY COLOR   APPearance CLOUDY (*) CLEAR   Specific Gravity, Urine >1.030 (*) 1.005 - 1.030   pH 5.5  5.0 - 8.0   Glucose, UA NEGATIVE  NEGATIVE mg/dL   Hgb urine dipstick NEGATIVE  NEGATIVE   Bilirubin Urine NEGATIVE  NEGATIVE   Ketones, ur TRACE (*) NEGATIVE mg/dL   Protein, ur NEGATIVE  NEGATIVE mg/dL   Urobilinogen, UA 0.2  0.0 - 1.0 mg/dL   Nitrite NEGATIVE  NEGATIVE   Leukocytes, UA NEGATIVE  NEGATIVE   Comment: MICROSCOPIC NOT DONE ON URINES WITH NEGATIVE PROTEIN, BLOOD, LEUKOCYTES, NITRITE, OR GLUCOSE <1000 mg/dL.  CLOSTRIDIUM  DIFFICILE BY PCR     Status: None   Collection Time    11/24/13  8:24 PM      Result Value Ref Range   C difficile by pcr NEGATIVE  NEGATIVE    Radiology Reports: No results found.   Problem List  Principal Problem:   Nausea vomiting and diarrhea Active Problems:   GERD   Cancer of ascending colon   Peripheral neuropathy   CAD (coronary artery disease)   HTN (hypertension)   Dehydration   Assessment: This is a 79 year old, Caucasian male, who presents with nausea, vomiting, and diarrhea since early morning today. Concern was the regarding C. difficile considering recent antibiotic use. However, C diff PCR has just come back negative. So it appears that his symptoms are primarily due to gastroenteritis probably related to his food intake at a [employer].  Plan: #1 nausea, vomiting, and diarrhea, probably gastroenteritis related to food bone  illness: C. Difficile is negative. He'll be treated symptomatically. He'll be hydrated. He'll be given ice chips to begin with and diet will be advanced as tolerated. His abdomen is completely benign. There is no need for any imaging studies.  #2 dehydration: Secondary to the above. He'll be given IV fluids. His ejection fraction was normal last year.  #3 leukocytosis: Most likely due to dehydrated state. No other obvious infection is evident. Continue to monitor.  #4 history of coronary artery disease: Stable. Continue with his home medications.  #5 history of colon cancer: He will follow up with his oncologist in near future. He had a colonoscopy in December of 2014 and polyps were noted. Pathology did not show any malignant changes.   DVT Prophylaxis: SCDs due to mention of blood in stool Code Status: Full code Family Communication: Discussed with the patient and his wife  Disposition Plan: Admit to Med Surg.   Further management decisions will depend on results of further testing and patient's response to  treatment.   [facility]  [facility] Pager [phone]  If 7PM-7AM, please contact night-coverage [email or web address] Password [facility]  11/24/2013, 9:28 PM

## 2013-11-24 NOTE — ED Notes (Signed)
Pt up to bathroom, unable to urinate at present. Given urinal to use at bedside.

## 2013-11-24 NOTE — ED Provider Notes (Signed)
CSN: [id number]     Arrival date & time 11/24/13  1428 History  This chart was scribed for David Norrie, MD by Roxan Diesel, ED scribe.  This patient was seen in room APA09/APA09 and the patient's care was started at 3:28 PM.   Chief Complaint  Patient presents with  . Diarrhea  . Emesis    The history is provided by the patient. No language interpreter was used.    HPI Comments: David Pugh is a 79 y.o. male who presents to the Emergency Department complaining of nausea, vomiting and diarrhea that began 12 hours ago.  Pt states he has had 10 episodes each of watery vomiting and diarrhea.  He reports that he began noticing "about a teaspoon" of blood in his diarrhea after the 8th or 9th episode.  He denies blood in vomit.  He denies abdominal pain, fever, dizziness, or lightheadedness.  Pt denies suspect food intake.  He denies h/o GI issues.  He does not smoke.  PCP is [name],[name], MD    Past Medical History  Diagnosis Date  . Premature ventricular contraction   . Prostate cancer 2010  . 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 Radiation Feb 2011    treatment  . Biliary dyskinesia   . GERD (gastroesophageal reflux disease)   . Hiatal hernia 2010    tiny  . HTN (hypertension)   . Rosacea   . PTSD (post-traumatic stress disorder)   . Angina pectoris, unstable   . Diverticula of colon 2010  . Colon polyps     tubular adenomas  . Arthritis   . Exposure to [employer]   . CAD (coronary artery disease)     hx of 2 stents   . Peripheral neuropathy   . Anemia     hx of anemia as a child   . Colon cancer 12/2011    s/p right hemicolectomy, did not tolerate chemo  . Iron deficiency 05/02/2012    followed by Dr. Tressie [name], iron infusions.   . Folic acid deficiency   . Heart palpitations 10/2012  . Gastroparesis 04/12/2013    borderline  . Myocardial infarction 3 yrs ago  . Glaucoma     Past Surgical History  Procedure Laterality Date  . Cholecystectomy    .  Prostatectomy    . Tonsillectomy    . Transurethral resection of prostate    . Esophagogastroduodenoscopy  10/10/2008    Dr. Tilda Burrow hiatal hernia, normal esophagus, normal stomach  . Colonoscopy  02/12/2004    Dr. Gala Romney- L side diverticula, inflammatory  colon polyps  . Esophagogastroduodenoscopy  02/12/2004    Dr. Dudley Major- normal   . Coronary stent placement  01/2011    2.5x74mm Xience drug-eluting stent in ramus intermedius/OMI vessel, 2.5x60mm Promus Element stent in mid LAD artery  . 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 Multiple colonic  polyps removed as described above. Sigmoid Diverticulosis/ 1.5 x 2 cm relatively flat ulcerated lesion in cecum/path showed adenocarcinoma of the colon. descending colon polyp with tubular adenoma and one fragment of focal high grade dysplasia.   . Knee surgery      left knee   . Partial colectomy  02/29/2012    Procedure: PARTIAL COLECTOMY;  Surgeon: Adin Hector, MD;  Location: [facility];  Service: General;  Laterality: Right;  . Vasectomy    . Colonoscopy  08/28/2012  RCV:[id number] proctitis. Colonic diverticulosis/Ulcerations at the surgical anastomosis  path: ulcerated colonic mucosa with prolapse changes, tubular adenoma.   . Cardiac catheterization  02/13/2011    [facility] cardiology  . Cataract extraction      2011  . Colonoscopy N/A 08/02/2013    Procedure: COLONOSCOPY;  Surgeon: Daneil Dolin, MD;  Location: [facility] ENDO SUITE;  Service: Endoscopy;  Laterality: N/A;  1200  . Cataract extraction w/phaco Right 08/06/2013    Procedure: CATARACT EXTRACTION PHACO AND INTRAOCULAR LENS PLACEMENT (IOC);  Surgeon: Tonny Branch, MD;  Location: [facility] ORS;  Service: Ophthalmology;  Laterality: Right;  CDE:20.96    Family History  Problem Relation Age of Onset  . Throat cancer Father   . Cancer Father     Throat  . Prostate cancer Brother   . Cancer Brother      Prostate and Skin  . Colon cancer Neg Hx     History  Substance Use Topics  . Smoking status: Never Smoker   . Smokeless tobacco: Never Used  . Alcohol Use: Yes     Comment: 1 cocktail/month     Review of Systems  Constitutional: Negative for fever.  Gastrointestinal: Positive for nausea, vomiting and diarrhea. Negative for abdominal pain.  Neurological: Negative for dizziness and light-headedness.  All other systems reviewed and are negative.      Allergies  Shellfish allergy; Sulfa antibiotics; and Sulfonamide derivatives  Home Medications   Current Outpatient Rx  Name  Route  Sig  Dispense  Refill  . acetaminophen (TYLENOL) 650 MG CR tablet   Oral   Take 650 mg by mouth 2 (two) times daily as needed for pain.          Marland Kitchen ALPRAZolam (XANAX) 0.25 MG tablet   Oral   Take 0.25 mg by mouth at bedtime as needed. Anxiety.         Marland Kitchen aspirin EC 81 MG tablet   Oral   Take 81 mg by mouth daily.         . B Complex Vitamins (VITAMIN B COMPLEX PO)   Oral   Take 1 tablet by mouth daily.          . cetirizine (ZYRTEC) 10 MG tablet   Oral   Take 10 mg by mouth daily.         . citalopram (CELEXA) 10 MG tablet   Oral   Take 10 mg by mouth every morning.          . latanoprost (XALATAN) 0.005 % ophthalmic solution   Both Eyes   Place 1 drop into both eyes at bedtime.          Marland Kitchen losartan (COZAAR) 50 MG tablet   Oral   Take 25 mg by mouth 2 (two) times daily.          . metoCLOPramide (REGLAN) 10 MG tablet   Oral   Take 1 tablet (10 mg total) by mouth every 6 (six) hours as needed for nausea or vomiting.   15 tablet   0   . metoprolol (LOPRESSOR) 50 MG tablet   Oral   Take 50 mg by mouth 2 (two) times daily.          . Multiple Vitamin (MULTIVITAMIN WITH MINERALS) TABS tablet   Oral   Take 1 tablet by mouth daily.         Marland Kitchen omeprazole (PRILOSEC) 40 MG capsule   Oral   Take 40 mg by mouth daily.         Marland Kitchen  pravastatin (PRAVACHOL) 40 MG  tablet   Oral   Take 40 mg by mouth at bedtime.          . Probiotic Product (PROBIOTIC DAILY PO)   Oral   Take 1 tablet by mouth daily.          . folic acid (FOLVITE) 1 MG tablet   Oral   Take 1 mg by mouth once a week. Sunday.         . nitroGLYCERIN (NITROSTAT) 0.4 MG SL tablet   Sublingual   Place 0.4 mg under the tongue every 5 (five) minutes x 3 doses as needed. For chest pain         . ondansetron (ZOFRAN ODT) 4 MG disintegrating tablet   Oral   Take 1 tablet (4 mg total) by mouth every 8 (eight) hours as needed for nausea or vomiting.   6 tablet   0   . ondansetron (ZOFRAN) 8 MG tablet   Oral   Take 8 mg by mouth every 8 (eight) hours as needed for nausea or vomiting.          BP 136/76  Pulse 110  Temp(Src) 98.6 F (37 C) (Oral)  Resp 16  SpO2 100%  Physical Exam  Nursing note and vitals reviewed. Constitutional: He is oriented to person, place, and time. He appears well-developed and well-nourished.  Non-toxic appearance. He does not appear ill. No distress.  HENT:  Head: Normocephalic and atraumatic.  Right Ear: External ear normal.  Left Ear: External ear normal.  Nose: Nose normal. No mucosal edema or rhinorrhea.  Mouth/Throat: Oropharynx is clear and moist and mucous membranes are normal. No dental abscesses or uvula swelling.  Eyes: Conjunctivae and EOM are normal. Pupils are equal, round, and reactive to light.  Neck: Normal range of motion and full passive range of motion without pain. Neck supple.  Cardiovascular: Normal rate, regular rhythm and normal heart sounds.  Exam reveals no gallop and no friction rub.   No murmur heard. Pulmonary/Chest: Effort normal and breath sounds normal. No respiratory distress. He has no wheezes. He has no rhonchi. He has no rales. He exhibits no tenderness and no crepitus.  Abdominal: Soft. Normal appearance and bowel sounds are normal. He exhibits no distension. There is no tenderness. There is no rebound  and no guarding.  Actively retching  Musculoskeletal: Normal range of motion. He exhibits no edema and no tenderness.  Moves all extremities well.   Neurological: He is alert and oriented to person, place, and time. He has normal strength. No cranial nerve deficit.  Skin: Skin is warm, dry and intact. No rash noted. No erythema. There is pallor.  Psychiatric: He has a normal mood and affect. His speech is normal and behavior is normal. His mood appears not anxious.    ED Course  Procedures (including critical care time)  Medications  0.9 %  sodium chloride infusion (0 mLs Intravenous Stopped 11/24/13 1700)    Followed by  0.9 %  sodium chloride infusion (1,000 mLs Intravenous New Bag/Given 11/24/13 2050)  metoCLOPramide (REGLAN) injection 10 mg (10 mg Intravenous Given 11/24/13 1541)  diphenhydrAMINE (BENADRYL) injection 25 mg (25 mg Intravenous Given 11/24/13 1539)  ondansetron (ZOFRAN) injection 4 mg (4 mg Intravenous Given 11/24/13 1613)  sodium chloride 0.9 % bolus 1,000 mL (1,000 mLs Intravenous New Bag/Given 11/24/13 1815)  loperamide (IMODIUM) capsule 4 mg (4 mg Oral Given 11/24/13 1904)  ondansetron (ZOFRAN) injection 4 mg (4 mg Intravenous Given  11/24/13 2052)     DIAGNOSTIC STUDIES: Oxygen Saturation is 100% on room air, normal by my interpretation.    COORDINATION OF CARE: 3:35 PM-Discussed treatment plan which includes IV fluids, anti-emetics, and labs with pt at bedside and pt agreed to plan.   Patient was given IV fluids. He was wanting to drink fluids as soon as the IV was started. He was asked to wait until his retching and vomiting had resolved.  1745 patient has been able to tolerate ice chips, he was given ginger ale which he drank without difficulty.We were just discussing that he would probably be ready to go home soon, however he had minimal urine output so he was given another bag of IV fluid.  19:00 PT had an episode of diarrhea.   20:30 Nurse reports patient  vomiting again and had another episode of diarrhea. She states it smells like c diff. Pt states he just finished a course of "powerful antibiotics" 5 days ago for a dental procedure. Patient does not recognize the word clindamycin. Pt is agreeable for admission.   21:02 Dr Maryland Pink, admit to med-surg, team 2   Labs Review Results for orders placed during the hospital encounter of 11/24/13  CBC WITH DIFFERENTIAL      Result Value Ref Range   WBC 20.5 (*) 4.0 - 10.5 K/uL   RBC 5.33  4.22 - 5.81 MIL/uL   Hemoglobin 16.7  13.0 - 17.0 g/dL   HCT 49.2  39.0 - 52.0 %   MCV 92.3  78.0 - 100.0 fL   MCH 31.3  26.0 - 34.0 pg   MCHC 33.9  30.0 - 36.0 g/dL   RDW 13.3  11.5 - 15.5 %   Platelets 313  150 - 400 K/uL   Neutrophils Relative % 91 (*) 43 - 77 %   Neutro Abs 18.7 (*) 1.7 - 7.7 K/uL   Lymphocytes Relative 3 (*) 12 - 46 %   Lymphs Abs 0.6 (*) 0.7 - 4.0 K/uL   Monocytes Relative 5  3 - 12 %   Monocytes Absolute 1.1 (*) 0.1 - 1.0 K/uL   Eosinophils Relative 0  0 - 5 %   Eosinophils Absolute 0.1  0.0 - 0.7 K/uL   Basophils Relative 0  0 - 1 %   Basophils Absolute 0.0  0.0 - 0.1 K/uL  COMPREHENSIVE METABOLIC PANEL      Result Value Ref Range   Sodium 143  137 - 147 mEq/L   Potassium 4.1  3.7 - 5.3 mEq/L   Chloride 103  96 - 112 mEq/L   CO2 23  19 - 32 mEq/L   Glucose, Bld 156 (*) 70 - 99 mg/dL   BUN 17  6 - 23 mg/dL   Creatinine, Ser 1.24  0.50 - 1.35 mg/dL   Calcium 9.5  8.4 - 10.5 mg/dL   Total Protein 7.8  6.0 - 8.3 g/dL   Albumin 4.2  3.5 - 5.2 g/dL   AST 26  0 - 37 U/L   ALT 22  0 - 53 U/L   Alkaline Phosphatase 68  39 - 117 U/L   Total Bilirubin 1.2  0.3 - 1.2 mg/dL   GFR calc non Af Amer 55 (*) >90 mL/min   GFR calc Af Amer 63 (*) >90 mL/min  URINALYSIS, ROUTINE W REFLEX MICROSCOPIC      Result Value Ref Range   Color, Urine ORANGE (*) YELLOW   APPearance CLOUDY (*) CLEAR   Specific Gravity,  Urine >1.030 (*) 1.005 - 1.030   pH 5.5  5.0 - 8.0   Glucose, UA NEGATIVE   NEGATIVE mg/dL   Hgb urine dipstick NEGATIVE  NEGATIVE   Bilirubin Urine NEGATIVE  NEGATIVE   Ketones, ur TRACE (*) NEGATIVE mg/dL   Protein, ur NEGATIVE  NEGATIVE mg/dL   Urobilinogen, UA 0.2  0.0 - 1.0 mg/dL   Nitrite NEGATIVE  NEGATIVE   Leukocytes, UA NEGATIVE  NEGATIVE     Laboratory interpretation all normal except concentrated urine consistent with dehydration, leukocytosis consistent with vomiting, although his hemoglobin is normal it is higher than his baseline consistent with dehydration   Imaging Review No results found.   EKG Interpretation None      MDM  patient presents with vomiting and diarrhea after finishing course of antibiotics for a dental procedure. He was doing well in the ED however he then started having more vomiting and diarrhea. His labs are consistent with dehydration. He is being admitted for further treatment.    Final diagnoses:  Nausea vomiting and diarrhea  Dehydration   Plan admission  Rolland Porter, MD, FACEP   I personally performed the services described in this documentation, which was scribed in my presence. The recorded information has been reviewed and considered.  Rolland Porter, MD, Abram Sander   David Norrie, MD 11/24/13 2108

## 2013-11-24 NOTE — ED Notes (Signed)
Patient given ice chips per RN approval. 

## 2013-11-24 NOTE — ED Notes (Signed)
Pt ready for transport to floor

## 2013-11-25 LAB — CBC
HCT: 41.1 % (ref 39.0–52.0)
HEMOGLOBIN: 14 g/dL (ref 13.0–17.0)
MCH: 31.5 pg (ref 26.0–34.0)
MCHC: 34.1 g/dL (ref 30.0–36.0)
MCV: 92.4 fL (ref 78.0–100.0)
Platelets: 260 10*3/uL (ref 150–400)
RBC: 4.45 MIL/uL (ref 4.22–5.81)
RDW: 13.6 % (ref 11.5–15.5)
WBC: 12.5 10*3/uL — AB (ref 4.0–10.5)

## 2013-11-25 LAB — COMPREHENSIVE METABOLIC PANEL
ALT: 18 U/L (ref 0–53)
AST: 21 U/L (ref 0–37)
Albumin: 3 g/dL — ABNORMAL LOW (ref 3.5–5.2)
Alkaline Phosphatase: 44 U/L (ref 39–117)
BUN: 19 mg/dL (ref 6–23)
CALCIUM: 8 mg/dL — AB (ref 8.4–10.5)
CO2: 20 meq/L (ref 19–32)
Chloride: 106 mEq/L (ref 96–112)
Creatinine, Ser: 1.24 mg/dL (ref 0.50–1.35)
GFR, EST AFRICAN AMERICAN: 63 mL/min — AB (ref >90)
GFR, EST NON AFRICAN AMERICAN: 55 mL/min — AB (ref >90)
GLUCOSE: 129 mg/dL — AB (ref 70–99)
Potassium: 3.8 mEq/L (ref 3.7–5.3)
SODIUM: 140 meq/L (ref 137–147)
TOTAL PROTEIN: 6.1 g/dL (ref 6.0–8.3)
Total Bilirubin: 0.6 mg/dL (ref 0.3–1.2)

## 2013-11-25 LAB — HEMOGLOBIN A1C
Hgb A1c MFr Bld: 5.9 % — ABNORMAL HIGH (ref <5.7)
MEAN PLASMA GLUCOSE: 123 mg/dL — AB (ref <117)

## 2013-11-25 MED ORDER — SODIUM CHLORIDE 0.9 % IV SOLN
INTRAVENOUS | Status: DC
  Administered 2013-11-25 – 2013-11-26 (×2): via INTRAVENOUS

## 2013-11-25 MED ORDER — LOPERAMIDE HCL 2 MG PO CAPS
4.0000 mg | ORAL_CAPSULE | Freq: Two times a day (BID) | ORAL | Status: DC
Start: 2013-11-25 — End: 2013-11-26
  Administered 2013-11-25 – 2013-11-26 (×3): 4 mg via ORAL
  Filled 2013-11-25 (×3): qty 2

## 2013-11-25 NOTE — Progress Notes (Addendum)
[facility] PROGRESS NOTE  David Pugh SWN:[id number] DOB: 1935-04-13 DOA: 11/24/2013 PCP: David Lair, MD  Assessment/Plan: 1. Nausea/vomiting/diarrhea,  -suspect gastroenteritis related to food bone illness: C. Diff PCR is negative.  -supportive care, IVF, antiemetics -no indication for Abx or imaging at this point -Imodium PRN  2. Dehydration: Secondary to the above -continue IVF  3.  leukocytosis:  -Most likely due to dehydrated state and above -Continue to monitor clinically   4.  history of coronary artery disease: - Stable. Continue with his home medications.   5. history of colon cancer -follow up with his oncologist, He had a colonoscopy in December of 2014 and polyps were noted. Pathology did not show any malignant changes.  6. Hyperglycemia -check hbaic   DVT Prophylaxis: SCDs  Code Status: Full code  Family Communication: Discussed with the patient  Disposition Plan: home tomorrow   HPI/Subjective: Still having diarrhea, but less frequently  Objective: Filed Vitals:   11/25/13 1300  BP: 123/67  Pulse: 82  Temp: 98.3 F (36.8 C)  Resp: 18    Intake/Output Summary (Last 24 hours) at 11/25/13 1532 Last data filed at 11/25/13 0644  Gross per 24 hour  Intake 796.67 ml  Output      0 ml  Net 796.67 ml   Filed Weights   11/24/13 2205 11/25/13 0527  Weight: 99.9 kg (220 lb 3.8 oz) 103.329 kg (227 lb 12.8 oz)    Exam:   General:  AAOx3, no distress  Cardiovascular: S1S2/RRR  Respiratory: CTAB  Abdomen: soft, NT, ND, BS present  Musculoskeletal: no edema c/c   Data Reviewed: Basic Metabolic Panel:  Recent Labs Lab 11/24/13 1520 11/25/13 0541  NA 143 140  K 4.1 3.8  CL 103 106  CO2 23 20  GLUCOSE 156* 129*  BUN 17 19  CREATININE 1.24 1.24  CALCIUM 9.5 8.0*   Liver Function Tests:  Recent Labs Lab 11/24/13 1520 11/25/13 0541  AST 26 21  ALT 22 18  ALKPHOS 68 44  BILITOT 1.2 0.6  PROT 7.8 6.1  ALBUMIN 4.2  3.0*   No results found for this basename: LIPASE, AMYLASE,  in the last 168 hours No results found for this basename: AMMONIA,  in the last 168 hours CBC:  Recent Labs Lab 11/24/13 1520 11/25/13 0541  WBC 20.5* 12.5*  NEUTROABS 18.7*  --   HGB 16.7 14.0  HCT 49.2 41.1  MCV 92.3 92.4  PLT 313 260   Cardiac Enzymes: No results found for this basename: CKTOTAL, CKMB, CKMBINDEX, TROPONINI,  in the last 168 hours BNP (last 3 results) No results found for this basename: PROBNP,  in the last 8760 hours CBG: No results found for this basename: GLUCAP,  in the last 168 hours  Recent Results (from the past 240 hour(s))  CLOSTRIDIUM DIFFICILE BY PCR     Status: None   Collection Time    11/24/13  8:24 PM      Result Value Ref Range Status   C difficile by pcr NEGATIVE  NEGATIVE Final     Studies: No results found.  Scheduled Meds: . aspirin EC  81 mg Oral Daily  . citalopram  10 mg Oral q morning - 10a  . latanoprost  1 drop Both Eyes QHS  . loperamide  4 mg Oral BID BM  . losartan  25 mg Oral BID  . metoprolol  50 mg Oral BID  . pantoprazole  40 mg Oral Daily  . simvastatin  20 mg Oral q1800   Continuous Infusions: . sodium chloride 75 mL/hr at 11/25/13 0800   Antibiotics Given (last 72 hours)   None      Principal Problem:   Nausea vomiting and diarrhea Active Problems:   GERD   Cancer of ascending colon   Peripheral neuropathy   CAD (coronary artery disease)   HTN (hypertension)   Dehydration    Time spent: 71min    David Pugh  [facility] Pager [phone]. If 7PM-7AM, please contact night-coverage at [email or web address], password [facility] 11/25/2013, 3:32 PM  LOS: 1 day

## 2013-11-26 ENCOUNTER — Ambulatory Visit: Payer: TRICARE For Life (TFL) | Admitting: Neurology

## 2013-11-26 DIAGNOSIS — A084 Viral intestinal infection, unspecified: Secondary | ICD-10-CM | POA: Diagnosis present

## 2013-11-26 LAB — BASIC METABOLIC PANEL
BUN: 15 mg/dL (ref 6–23)
CO2: 23 mEq/L (ref 19–32)
Calcium: 7.8 mg/dL — ABNORMAL LOW (ref 8.4–10.5)
Chloride: 108 mEq/L (ref 96–112)
Creatinine, Ser: 1.2 mg/dL (ref 0.50–1.35)
GFR, EST AFRICAN AMERICAN: 66 mL/min — AB (ref >90)
GFR, EST NON AFRICAN AMERICAN: 57 mL/min — AB (ref >90)
Glucose, Bld: 95 mg/dL (ref 70–99)
POTASSIUM: 3.6 meq/L — AB (ref 3.7–5.3)
Sodium: 141 mEq/L (ref 137–147)

## 2013-11-26 LAB — CBC
HCT: 38 % — ABNORMAL LOW (ref 39.0–52.0)
HEMOGLOBIN: 12.6 g/dL — AB (ref 13.0–17.0)
MCH: 30.6 pg (ref 26.0–34.0)
MCHC: 33.2 g/dL (ref 30.0–36.0)
MCV: 92.2 fL (ref 78.0–100.0)
Platelets: 194 10*3/uL (ref 150–400)
RBC: 4.12 MIL/uL — ABNORMAL LOW (ref 4.22–5.81)
RDW: 13.5 % (ref 11.5–15.5)
WBC: 8.7 10*3/uL (ref 4.0–10.5)

## 2013-11-26 NOTE — Progress Notes (Signed)
UR chart review completed.  

## 2013-11-26 NOTE — Progress Notes (Signed)
Patient discharged with instructions given on medications,and follow up visits,patient verbalized understanding. No c/o pain or discomfort noted. Accompanied by staff to an awaiting vehicle.. 

## 2013-11-29 ENCOUNTER — Encounter (HOSPITAL_COMMUNITY): Payer: Medicare Other | Attending: Oncology

## 2013-11-29 DIAGNOSIS — C182 Malignant neoplasm of ascending colon: Secondary | ICD-10-CM | POA: Insufficient documentation

## 2013-11-29 LAB — CBC WITH DIFFERENTIAL/PLATELET
BASOS ABS: 0.1 10*3/uL (ref 0.0–0.1)
Basophils Relative: 1 % (ref 0–1)
EOS ABS: 0.5 10*3/uL (ref 0.0–0.7)
EOS PCT: 5 % (ref 0–5)
HCT: 44.9 % (ref 39.0–52.0)
Hemoglobin: 15.5 g/dL (ref 13.0–17.0)
Lymphocytes Relative: 36 % (ref 12–46)
Lymphs Abs: 3.6 10*3/uL (ref 0.7–4.0)
MCH: 30.5 pg (ref 26.0–34.0)
MCHC: 34.5 g/dL (ref 30.0–36.0)
MCV: 88.2 fL (ref 78.0–100.0)
MONO ABS: 1.4 10*3/uL — AB (ref 0.1–1.0)
Monocytes Relative: 14 % — ABNORMAL HIGH (ref 3–12)
Neutro Abs: 4.6 10*3/uL (ref 1.7–7.7)
Neutrophils Relative %: 45 % (ref 43–77)
PLATELETS: 296 10*3/uL (ref 150–400)
RBC: 5.09 MIL/uL (ref 4.22–5.81)
RDW: 12.8 % (ref 11.5–15.5)
WBC: 10.2 10*3/uL (ref 4.0–10.5)

## 2013-11-29 LAB — COMPREHENSIVE METABOLIC PANEL
ALBUMIN: 3.5 g/dL (ref 3.5–5.2)
ALT: 29 U/L (ref 0–53)
AST: 35 U/L (ref 0–37)
Alkaline Phosphatase: 59 U/L (ref 39–117)
BUN: 11 mg/dL (ref 6–23)
CALCIUM: 9.1 mg/dL (ref 8.4–10.5)
CO2: 28 mEq/L (ref 19–32)
CREATININE: 1.1 mg/dL (ref 0.50–1.35)
Chloride: 105 mEq/L (ref 96–112)
GFR calc Af Amer: 73 mL/min — ABNORMAL LOW (ref >90)
GFR calc non Af Amer: 63 mL/min — ABNORMAL LOW (ref >90)
Glucose, Bld: 108 mg/dL — ABNORMAL HIGH (ref 70–99)
Potassium: 4 mEq/L (ref 3.7–5.3)
Sodium: 143 mEq/L (ref 137–147)
Total Bilirubin: 1.1 mg/dL (ref 0.3–1.2)
Total Protein: 7.2 g/dL (ref 6.0–8.3)

## 2013-11-30 ENCOUNTER — Other Ambulatory Visit (HOSPITAL_COMMUNITY): Payer: 59

## 2013-11-30 LAB — CEA: CEA: 0.7 ng/mL (ref 0.0–5.0)

## 2013-11-30 NOTE — Progress Notes (Signed)
Labs drawn via PIV stick per Pam, Phlebotomist.   

## 2013-12-03 ENCOUNTER — Ambulatory Visit (HOSPITAL_COMMUNITY)
Admission: RE | Admit: 2013-12-03 | Discharge: 2013-12-03 | Disposition: A | Discharge status: Home / Self Care | Payer: Medicare Other | Source: Ambulatory Visit | Attending: Hematology and Oncology | Admitting: Hematology and Oncology

## 2013-12-03 ENCOUNTER — Encounter (HOSPITAL_COMMUNITY): Payer: Self-pay

## 2013-12-03 ENCOUNTER — Ambulatory Visit (HOSPITAL_COMMUNITY): Payer: Medicare Other

## 2013-12-03 DIAGNOSIS — C182 Malignant neoplasm of ascending colon: Secondary | ICD-10-CM

## 2013-12-03 DIAGNOSIS — C189 Malignant neoplasm of colon, unspecified: Secondary | ICD-10-CM | POA: Insufficient documentation

## 2013-12-03 MED ORDER — IOHEXOL 300 MG/ML  SOLN
100.0000 mL | Freq: Once | INTRAMUSCULAR | Status: AC | PRN
  Administered 2013-12-03: 100 mL via INTRAVENOUS

## 2013-12-04 ENCOUNTER — Encounter (HOSPITAL_COMMUNITY): Payer: Self-pay

## 2013-12-04 ENCOUNTER — Encounter (HOSPITAL_BASED_OUTPATIENT_CLINIC_OR_DEPARTMENT_OTHER): Payer: Medicare Other

## 2013-12-04 VITALS — BP 157/85 | HR 73 | Temp 97.5°F | Resp 18 | Wt 219.4 lb

## 2013-12-04 DIAGNOSIS — D529 Folate deficiency anemia, unspecified: Secondary | ICD-10-CM

## 2013-12-04 DIAGNOSIS — E611 Iron deficiency: Secondary | ICD-10-CM

## 2013-12-04 DIAGNOSIS — C182 Malignant neoplasm of ascending colon: Secondary | ICD-10-CM

## 2013-12-04 DIAGNOSIS — D509 Iron deficiency anemia, unspecified: Secondary | ICD-10-CM

## 2013-12-04 NOTE — Patient Instructions (Signed)
[facility] Discharge Instructions  RECOMMENDATIONS MADE BY THE CONSULTANT AND ANY TEST RESULTS WILL BE SENT TO YOUR REFERRING PHYSICIAN.  EXAM FINDINGS BY THE PHYSICIAN TODAY AND SIGNS OR SYMPTOMS TO REPORT TO CLINIC OR PRIMARY PHYSICIAN: Exam and findings as discussed by Dr.Formanek.  MEDICATIONS PRESCRIBED:  Continue medications as prescribed.  INSTRUCTIONS/FOLLOW-UP: Return in 12 weeks for CEA level. Return in 6 months for MD appointment and lab work. Report any issues/concerns to clinic as needed prior to appointment.  Thank you for choosing [facility] to provide your oncology and hematology care.  To afford each patient quality time with our providers, please arrive at least 15 minutes before your scheduled appointment time.  With your help, our goal is to use those 15 minutes to complete the necessary work-up to ensure our physicians have the information they need to help with your evaluation and healthcare recommendations.    Effective January 1st, 2014, we ask that you re-schedule your appointment with our physicians should you arrive 10 or more minutes late for your appointment.  We strive to give you quality time with our providers, and arriving late affects you and other patients whose appointments are after yours.    Again, thank you for choosing [facility].  Our hope is that these requests will decrease the amount of time that you wait before being seen by our physicians.       _____________________________________________________________  Should you have questions after your visit to [facility], please contact our office at [phone] between the hours of 8:30 a.m. and 5:00 p.m.  Voicemails left after 4:30 p.m. will not be returned until the following business day.  For prescription refill requests, have your pharmacy contact our office with your prescription refill request.

## 2013-12-04 NOTE — Progress Notes (Signed)
[facility]  OFFICE PROGRESS NOTE  [name], MD [address] [place] [address]  DIAGNOSIS: No diagnosis found.  No chief complaint on file.   CURRENT THERAPY: [place].  INTERVAL HISTORY: David Pugh 79 y.o. male returns for followup of stage III,, status post right hemicolectomy with inability to tolerate Xeloda adjuvant treatment. Is also had a history of prostate cancer, coronary artery disease, status post stent placement in June 2012 as well as we sent to me in remote past with radiation proctitis as well. He did undergo colonoscopy in December 2014 was told he need not repeat the study for 3 years. He was in the hospital 2 weeks ago with diarrhea and vomiting. The rest  of the family was sick as well. He was treated with IV fluids and support but final diagnoses presumably due to a virus. He continues to do well with normal bowel movements and no rectal pain, dysuria, or incontinence. He denies any fever, night sweats, abdominal pain, nausea, vomiting, but does suffer with peripheral neuropathy at that time his is ambulating with a staggered gait. He denies any cough, wheezing, nasal drip, sore throat, earache, skin rash, headache, or seizures.  MEDICAL HISTORY: Past Medical History  Diagnosis Date  . Premature ventricular contraction   . Prostate cancer 2010  . 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 Radiation Feb 2011    treatment  . Biliary dyskinesia   . GERD (gastroesophageal reflux disease)   . Hiatal hernia 2010    tiny  . HTN (hypertension)   . Rosacea   . PTSD (post-traumatic stress disorder)   . Angina pectoris, unstable   . Diverticula of colon 2010  . Colon polyps     tubular adenomas  . Arthritis   . Exposure to [employer]   . CAD (coronary artery disease)     hx of 2 stents   . Peripheral neuropathy   . Anemia     hx of anemia as a child   . Colon cancer 12/2011   s/p right hemicolectomy, did not tolerate chemo  . Iron deficiency 05/02/2012    followed by Dr. Tressie [name], iron infusions.   . Folic acid deficiency   . Heart palpitations 10/2012  . Gastroparesis 04/12/2013    borderline  . Myocardial infarction 3 yrs ago  . Glaucoma     INTERIM HISTORY: has PREMATURE VENTRICULAR CONTRACTIONS; GERD; Cancer of ascending colon; Iron deficiency; Hx of adenomatous colonic polyps; Folic acid deficiency; Peripheral neuropathy; Chest pain - ruled out for MI; negative Nuclear Stress Test 10/17/12; CAD (coronary artery disease); HTN (hypertension); Dyslipidemia; Nausea vomiting and diarrhea; Dehydration; and Viral gastroenteritis on his problem list.    ALLERGIES:  is allergic to shellfish allergy; sulfa antibiotics; and sulfonamide derivatives.  MEDICATIONS: has a current medication list which includes the following prescription(s): acetaminophen, alprazolam, aspirin ec, b complex vitamins, cetirizine, citalopram, folic acid, latanoprost, losartan, metoclopramide, metoprolol, multivitamin with minerals, nitroglycerin, omeprazole, ondansetron, pravastatin, and probiotic product.  SURGICAL HISTORY:  Past Surgical History  Procedure Laterality Date  . Cholecystectomy    . Prostatectomy    . Tonsillectomy    . Transurethral resection of prostate    . Esophagogastroduodenoscopy  10/10/2008    Dr. Tilda Burrow hiatal hernia, normal esophagus, normal stomach  . Colonoscopy  02/12/2004    Dr. Gala Romney- L side diverticula, inflammatory  colon polyps  .  Esophagogastroduodenoscopy  02/12/2004    Dr. Dudley Major- normal   . Coronary stent placement  01/2011    2.5x68m Xience drug-eluting stent in ramus intermedius/OMI vessel, 2.5x672mPromus Element stent in mid LAD artery  . 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 Multiple colonic  polyps removed as described above. Sigmoid Diverticulosis/ 1.5 x 2 cm  relatively flat ulcerated lesion in cecum/path showed adenocarcinoma of the colon. descending colon polyp with tubular adenoma and one fragment of focal high grade dysplasia.   . Knee surgery      left knee   . Partial colectomy  02/29/2012    Procedure: PARTIAL COLECTOMY;  Surgeon: HaAdin HectorMD;  Location: [facility];  Service: General;  Laterality: Right;  . Vasectomy    . Colonoscopy  08/28/2012    RMWUG:QBVQXIHWTroctitis. Colonic diverticulosis/Ulcerations at the surgical anastomosis  path: ulcerated colonic mucosa with prolapse changes, tubular adenoma.   . [facility]  [facility]  . Cataract extraction      2011  . Colonoscopy N/A 08/02/2013    Procedure: COLONOSCOPY;  Surgeon: RoDaneil DolinMD;  Location: [facility] SU[facility];  Service: Endoscopy;  Laterality: N/A;  1200  . Cataract extraction w/phaco Right 08/06/2013    Procedure: CATARACT EXTRACTION PHACO AND INTRAOCULAR LENS PLACEMENT (IOC);  Surgeon: KeTonny BranchMD;  Location: [facility];  Service: Ophthalmology;  Laterality: Right;  CDE:20.96    FAMILY HISTORY: family history includes Cancer in his brother and father; Prostate cancer in his brother; Throat cancer in his father. There is no history of Colon cancer.  SOCIAL HISTORY:  reports that he has never smoked. He has never used smokeless tobacco. He reports that he drinks alcohol. He reports that he does not use illicit drugs.  REVIEW OF SYSTEMS:  Other than that discussed above is noncontributory.  PHYSICAL EXAMINATION: ECOG PERFORMANCE STATUS: 1 - Symptomatic but completely ambulatory  Blood pressure 157/85, pulse 73, temperature 97.5 F (36.4 C), temperature source Oral, resp. rate 18, weight 219 lb 6.4 oz (99.519 kg).  GENERAL:alert, no distress and comfortable SKIN: skin color, texture, turgor are normal, no rashes or significant lesions EYES: PERLA; Conjunctiva are pink and non-injected, sclera clear SINUSES: No redness or  tenderness over maxillary or ethmoid sinuses OROPHARYNX:no exudate, no erythema on lips, buccal mucosa, or tongue. NECK: supple, thyroid normal size, non-tender, without nodularity. No masses CHEST: Normal AP diameter with no breast masses. LYMPH:  no palpable lymphadenopathy in the cervical, axillary or inguinal LUNGS: clear to auscultation and percussion with normal breathing effort HEART: regular rate & rhythm and no murmurs. ABDOMEN:abdomen soft, non-tender and normal bowel sounds. No hepatosplenomegaly, ascites, or CVA tenderness. MUSCULOSKELETAL:no cyanosis of digits and no clubbing. Range of motion normal.  NEURO: alert & oriented x 3 with fluent speech, no focal motor/sensory deficits   LABORATORY DATA: Infusion on 11/29/2013  Component Date Value Ref Range Status  . WBC 11/29/2013 10.2  4.0 - 10.5 K/uL Final  . RBC 11/29/2013 5.09  4.22 - 5.81 MIL/uL Final  . Hemoglobin 11/29/2013 15.5  13.0 - 17.0 g/dL Final  . HCT 11/29/2013 44.9  39.0 - 52.0 % Final  . MCV 11/29/2013 88.2  78.0 - 100.0 fL Final  . MCH 11/29/2013 30.5  26.0 - 34.0 pg Final  . MCHC 11/29/2013 34.5  30.0 - 36.0 g/dL Final  . RDW 11/29/2013 12.8  11.5 - 15.5 % Final  .  Platelets 11/29/2013 296  150 - 400 K/uL Final  . Neutrophils Relative % 11/29/2013 45  43 - 77 % Final  . Neutro Abs 11/29/2013 4.6  1.7 - 7.7 K/uL Final  . Lymphocytes Relative 11/29/2013 36  12 - 46 % Final  . Lymphs Abs 11/29/2013 3.6  0.7 - 4.0 K/uL Final  . Monocytes Relative 11/29/2013 14* 3 - 12 % Final  . Monocytes Absolute 11/29/2013 1.4* 0.1 - 1.0 K/uL Final  . Eosinophils Relative 11/29/2013 5  0 - 5 % Final  . Eosinophils Absolute 11/29/2013 0.5  0.0 - 0.7 K/uL Final  . Basophils Relative 11/29/2013 1  0 - 1 % Final  . Basophils Absolute 11/29/2013 0.1  0.0 - 0.1 K/uL Final  . WBC Morphology 11/29/2013 ATYPICAL LYMPHOCYTES   Final  . Sodium 11/29/2013 143  137 - 147 mEq/L Final  . Potassium 11/29/2013 4.0  3.7 - 5.3 mEq/L Final    . Chloride 11/29/2013 105  96 - 112 mEq/L Final  . CO2 11/29/2013 28  19 - 32 mEq/L Final  . Glucose, Bld 11/29/2013 108* 70 - 99 mg/dL Final  . BUN 11/29/2013 11  6 - 23 mg/dL Final  . Creatinine, Ser 11/29/2013 1.10  0.50 - 1.35 mg/dL Final  . Calcium 11/29/2013 9.1  8.4 - 10.5 mg/dL Final  . Total Protein 11/29/2013 7.2  6.0 - 8.3 g/dL Final  . Albumin 11/29/2013 3.5  3.5 - 5.2 g/dL Final  . AST 11/29/2013 35  0 - 37 U/L Final  . ALT 11/29/2013 29  0 - 53 U/L Final  . Alkaline Phosphatase 11/29/2013 59  39 - 117 U/L Final  . Total Bilirubin 11/29/2013 1.1  0.3 - 1.2 mg/dL Final  . GFR calc non Af Amer 11/29/2013 63* >90 mL/min Final  . GFR calc Af Amer 11/29/2013 73* >90 mL/min Final   Comment: (NOTE)                          The eGFR has been calculated using the CKD EPI equation.                          This calculation has not been validated in all clinical situations.                          eGFR's persistently <90 mL/min signify possible Chronic Kidney                          Disease.  . CEA 11/29/2013 0.7  0.0 - 5.0 ng/mL Final   Performed at [facility]  Admission on 11/24/2013, Discharged on 11/26/2013  Component Date Value Ref Range Status  . WBC 11/24/2013 20.5* 4.0 - 10.5 K/uL Final  . RBC 11/24/2013 5.33  4.22 - 5.81 MIL/uL Final  . Hemoglobin 11/24/2013 16.7  13.0 - 17.0 g/dL Final  . HCT 11/24/2013 49.2  39.0 - 52.0 % Final  . MCV 11/24/2013 92.3  78.0 - 100.0 fL Final  . MCH 11/24/2013 31.3  26.0 - 34.0 pg Final  . MCHC 11/24/2013 33.9  30.0 - 36.0 g/dL Final  . RDW 11/24/2013 13.3  11.5 - 15.5 % Final  . Platelets 11/24/2013 313  150 - 400 K/uL Final  . Neutrophils Relative % 11/24/2013 91* 43 - 77 % Final  . Neutro Abs  11/24/2013 18.7* 1.7 - 7.7 K/uL Final  . Lymphocytes Relative 11/24/2013 3* 12 - 46 % Final  . Lymphs Abs 11/24/2013 0.6* 0.7 - 4.0 K/uL Final  . Monocytes Relative 11/24/2013 5  3 - 12 % Final  . Monocytes Absolute 11/24/2013 1.1*  0.1 - 1.0 K/uL Final  . Eosinophils Relative 11/24/2013 0  0 - 5 % Final  . Eosinophils Absolute 11/24/2013 0.1  0.0 - 0.7 K/uL Final  . Basophils Relative 11/24/2013 0  0 - 1 % Final  . Basophils Absolute 11/24/2013 0.0  0.0 - 0.1 K/uL Final  . Sodium 11/24/2013 143  137 - 147 mEq/L Final  . Potassium 11/24/2013 4.1  3.7 - 5.3 mEq/L Final  . Chloride 11/24/2013 103  96 - 112 mEq/L Final  . CO2 11/24/2013 23  19 - 32 mEq/L Final  . Glucose, Bld 11/24/2013 156* 70 - 99 mg/dL Final  . BUN 11/24/2013 17  6 - 23 mg/dL Final  . Creatinine, Ser 11/24/2013 1.24  0.50 - 1.35 mg/dL Final  . Calcium 11/24/2013 9.5  8.4 - 10.5 mg/dL Final  . Total Protein 11/24/2013 7.8  6.0 - 8.3 g/dL Final  . Albumin 11/24/2013 4.2  3.5 - 5.2 g/dL Final  . AST 11/24/2013 26  0 - 37 U/L Final  . ALT 11/24/2013 22  0 - 53 U/L Final  . Alkaline Phosphatase 11/24/2013 68  39 - 117 U/L Final  . Total Bilirubin 11/24/2013 1.2  0.3 - 1.2 mg/dL Final  . GFR calc non Af Amer 11/24/2013 55* >90 mL/min Final  . GFR calc Af Amer 11/24/2013 63* >90 mL/min Final   Comment: (NOTE)                          The eGFR has been calculated using the CKD EPI equation.                          This calculation has not been validated in all clinical situations.                          eGFR's persistently <90 mL/min signify possible Chronic Kidney                          Disease.  . Color, Urine 11/24/2013 ORANGE* YELLOW Final   BIOCHEMICALS MAY BE AFFECTED BY COLOR  . APPearance 11/24/2013 CLOUDY* CLEAR Final  . Specific Gravity, Urine 11/24/2013 >1.030* 1.005 - 1.030 Final  . pH 11/24/2013 5.5  5.0 - 8.0 Final  . Glucose, UA 11/24/2013 NEGATIVE  NEGATIVE mg/dL Final  . Hgb urine dipstick 11/24/2013 NEGATIVE  NEGATIVE Final  . Bilirubin Urine 11/24/2013 NEGATIVE  NEGATIVE Final  . Ketones, ur 11/24/2013 TRACE* NEGATIVE mg/dL Final  . Protein, ur 11/24/2013 NEGATIVE  NEGATIVE mg/dL Final  . Urobilinogen, UA 11/24/2013 0.2  0.0 -  1.0 mg/dL Final  . Nitrite 11/24/2013 NEGATIVE  NEGATIVE Final  . Leukocytes, UA 11/24/2013 NEGATIVE  NEGATIVE Final   MICROSCOPIC NOT DONE ON URINES WITH NEGATIVE PROTEIN, BLOOD, LEUKOCYTES, NITRITE, OR GLUCOSE <1000 mg/dL.  . C difficile by pcr 11/24/2013 NEGATIVE  NEGATIVE Final  . Sodium 11/25/2013 140  137 - 147 mEq/L Final  . Potassium 11/25/2013 3.8  3.7 - 5.3 mEq/L Final  . Chloride 11/25/2013 106  96 - 112 mEq/L Final  .  CO2 11/25/2013 20  19 - 32 mEq/L Final  . Glucose, Bld 11/25/2013 129* 70 - 99 mg/dL Final  . BUN 11/25/2013 19  6 - 23 mg/dL Final  . Creatinine, Ser 11/25/2013 1.24  0.50 - 1.35 mg/dL Final  . Calcium 11/25/2013 8.0* 8.4 - 10.5 mg/dL Final  . Total Protein 11/25/2013 6.1  6.0 - 8.3 g/dL Final  . Albumin 11/25/2013 3.0* 3.5 - 5.2 g/dL Final  . AST 11/25/2013 21  0 - 37 U/L Final  . ALT 11/25/2013 18  0 - 53 U/L Final  . Alkaline Phosphatase 11/25/2013 44  39 - 117 U/L Final  . Total Bilirubin 11/25/2013 0.6  0.3 - 1.2 mg/dL Final  . GFR calc non Af Amer 11/25/2013 55* >90 mL/min Final  . GFR calc Af Amer 11/25/2013 63* >90 mL/min Final   Comment: (NOTE)                          The eGFR has been calculated using the CKD EPI equation.                          This calculation has not been validated in all clinical situations.                          eGFR's persistently <90 mL/min signify possible Chronic Kidney                          Disease.  . WBC 11/25/2013 12.5* 4.0 - 10.5 K/uL Final  . RBC 11/25/2013 4.45  4.22 - 5.81 MIL/uL Final  . Hemoglobin 11/25/2013 14.0  13.0 - 17.0 g/dL Final  . HCT 11/25/2013 41.1  39.0 - 52.0 % Final  . MCV 11/25/2013 92.4  78.0 - 100.0 fL Final  . MCH 11/25/2013 31.5  26.0 - 34.0 pg Final  . MCHC 11/25/2013 34.1  30.0 - 36.0 g/dL Final  . RDW 11/25/2013 13.6  11.5 - 15.5 % Final  . Platelets 11/25/2013 260  150 - 400 K/uL Final  . Hemoglobin A1C 11/25/2013 5.9* <5.7 % Final   Comment: (NOTE)                                                                                                                          According to the ADA Clinical Practice Recommendations for 2011, when                          HbA1c is used as a screening test:                           >=6.5%   Diagnostic of Diabetes Mellitus                                    (  if abnormal result is confirmed)                          5.7-6.4%   Increased risk of developing Diabetes Mellitus                          References:Diagnosis and Classification of Diabetes Mellitus,Diabetes                          HBZJ,6967,89(FYBOF 1):S62-S69 and Standards of Medical Care in                                  Diabetes - 2011,Diabetes BPZW,2585,27 (Suppl 1):S11-S61.  . Mean Plasma Glucose 11/25/2013 123* <117 mg/dL Final   Performed at [facility]  . WBC 11/26/2013 8.7  4.0 - 10.5 K/uL Final  . RBC 11/26/2013 4.12* 4.22 - 5.81 MIL/uL Final  . Hemoglobin 11/26/2013 12.6* 13.0 - 17.0 g/dL Final  . HCT 11/26/2013 38.0* 39.0 - 52.0 % Final  . MCV 11/26/2013 92.2  78.0 - 100.0 fL Final  . MCH 11/26/2013 30.6  26.0 - 34.0 pg Final  . MCHC 11/26/2013 33.2  30.0 - 36.0 g/dL Final  . RDW 11/26/2013 13.5  11.5 - 15.5 % Final  . Platelets 11/26/2013 194  150 - 400 K/uL Final   DELTA CHECK NOTED  . Sodium 11/26/2013 141  137 - 147 mEq/L Final  . Potassium 11/26/2013 3.6* 3.7 - 5.3 mEq/L Final  . Chloride 11/26/2013 108  96 - 112 mEq/L Final  . CO2 11/26/2013 23  19 - 32 mEq/L Final  . Glucose, Bld 11/26/2013 95  70 - 99 mg/dL Final  . BUN 11/26/2013 15  6 - 23 mg/dL Final  . Creatinine, Ser 11/26/2013 1.20  0.50 - 1.35 mg/dL Final  . Calcium 11/26/2013 7.8* 8.4 - 10.5 mg/dL Final  . GFR calc non Af Amer 11/26/2013 57* >90 mL/min Final  . GFR calc Af Amer 11/26/2013 66* >90 mL/min Final   Comment: (NOTE)                          The eGFR has been calculated using the CKD EPI equation.                          This calculation has not been validated  in all clinical situations.                          eGFR's persistently <90 mL/min signify possible Chronic Kidney                          Disease.    PATHOLOGY: No new pathology.  Urinalysis    Component Value Date/Time   COLORURINE ORANGE* 11/24/2013 1730   APPEARANCEUR CLOUDY* 11/24/2013 1730   LABSPEC >1.030* 11/24/2013 1730   PHURINE 5.5 11/24/2013 1730   GLUCOSEU NEGATIVE 11/24/2013 1730   HGBUR NEGATIVE 11/24/2013 1730   BILIRUBINUR NEGATIVE 11/24/2013 1730   KETONESUR TRACE* 11/24/2013 1730   PROTEINUR NEGATIVE 11/24/2013 1730   UROBILINOGEN 0.2 11/24/2013 1730   NITRITE NEGATIVE 11/24/2013 1730   LEUKOCYTESUR NEGATIVE 11/24/2013 1730    RADIOGRAPHIC STUDIES: Ct Chest W  Contrast  12/03/2013   CLINICAL DATA:  Restaging colon cancer  EXAM: CT CHEST, ABDOMEN, AND PELVIS WITH CONTRAST  TECHNIQUE: Multidetector CT imaging of the chest, abdomen and pelvis was performed following the standard protocol during bolus administration of intravenous contrast.  CONTRAST:  159m OMNIPAQUE IOHEXOL 300 MG/ML  SOLN  COMPARISON:  03/12/2013  FINDINGS: CT CHEST FINDINGS  No pleural effusion identified. There is no airspace consolidation or atelectasis noted. Small granulomas are noted within the lung bases. No worrisome pulmonary nodule or mass.  Normal heart size. No pericardial effusion. Calcified atherosclerotic disease involves the thoracic aorta as well as the LAD and left circumflex coronary arteries. There are no enlarged mediastinal or hilar lymph nodes. No axillary or supraclavicular adenopathy.  Review of the visualized osseous structures is significant for mild thoracic spondylosis. No aggressive lytic or sclerotic bone lesions.  CT ABDOMEN AND PELVIS FINDINGS  No suspicious liver abnormality identified. The patient is status post cholecystectomy. No biliary dilatation. Normal appearance of the pancreas. The spleen is unremarkable. The adrenal glands both appear normal. Normal appearance of the right  kidney. There is a small cyst within the inferior pole of the left kidney measuring 1.3 cm, image 86/series 2.  The urinary bladder appears normal. Seed implants are identified within the prostate gland. Symmetric appearance of the seminal vesicles.  Normal caliber of the abdominal aorta. No aneurysm. There is no upper abdominal adenopathy. No pelvic or inguinal adenopathy identified. There is no free fluid or abnormal fluid collections within the abdomen or pelvis.  The stomach is normal. There is a small ventral abdominal wall hernia which contains a loop of nonobstructed small bowel, image 89/series 2. No obstruction. Status post right hemicolectomy. Scattered distal colonic diverticula noted.  Review of the visualized osseous structures is negative for aggressive lytic or sclerotic bone lesions.  IMPRESSION: 1. No specific features identified to suggest metastatic disease within the chest, abdomen or pelvis. 2. Atherosclerotic disease including multi vessel coronary artery calcifications. 3. Prior granulomatous disease.   Electronically Signed   By: TKerby MoorsM.D.   On: 12/03/2013 13:46   Ct Abdomen Pelvis W Contrast  12/03/2013   CLINICAL DATA:  Restaging colon cancer  EXAM: CT CHEST, ABDOMEN, AND PELVIS WITH CONTRAST  TECHNIQUE: Multidetector CT imaging of the chest, abdomen and pelvis was performed following the standard protocol during bolus administration of intravenous contrast.  CONTRAST:  1017mOMNIPAQUE IOHEXOL 300 MG/ML  SOLN  COMPARISON:  03/12/2013  FINDINGS: CT CHEST FINDINGS  No pleural effusion identified. There is no airspace consolidation or atelectasis noted. Small granulomas are noted within the lung bases. No worrisome pulmonary nodule or mass.  Normal heart size. No pericardial effusion. Calcified atherosclerotic disease involves the thoracic aorta as well as the LAD and left circumflex coronary arteries. There are no enlarged mediastinal or hilar lymph nodes. No axillary or  supraclavicular adenopathy.  Review of the visualized osseous structures is significant for mild thoracic spondylosis. No aggressive lytic or sclerotic bone lesions.  CT ABDOMEN AND PELVIS FINDINGS  No suspicious liver abnormality identified. The patient is status post cholecystectomy. No biliary dilatation. Normal appearance of the pancreas. The spleen is unremarkable. The adrenal glands both appear normal. Normal appearance of the right kidney. There is a small cyst within the inferior pole of the left kidney measuring 1.3 cm, image 86/series 2.  The urinary bladder appears normal. Seed implants are identified within the prostate gland. Symmetric appearance of the seminal vesicles.  Normal caliber of  the abdominal aorta. No aneurysm. There is no upper abdominal adenopathy. No pelvic or inguinal adenopathy identified. There is no free fluid or abnormal fluid collections within the abdomen or pelvis.  The stomach is normal. There is a small ventral abdominal wall hernia which contains a loop of nonobstructed small bowel, image 89/series 2. No obstruction. Status post right hemicolectomy. Scattered distal colonic diverticula noted.  Review of the visualized osseous structures is negative for aggressive lytic or sclerotic bone lesions.  IMPRESSION: 1. No specific features identified to suggest metastatic disease within the chest, abdomen or pelvis. 2. Atherosclerotic disease including multi vessel coronary artery calcifications. 3. Prior granulomatous disease.   Electronically Signed   By: Kerby Moors M.D.   On: 12/03/2013 13:46    ASSESSMENT:  #1. Stage III carcinoma of the ascending colon, status post resection on 03/02/2012, poor tolerance of adjuvant Xeloda. Postoperative treatment therefore was not given. #2. Peripheral neuropathy, stable. #3. Iron deficiency anemia, status post intravenous Feraheme in August and November 2013, stable #4. Folic acid deficiency, on supplements.   PLAN:  #1. Continue  folic acid 1 mg daily. #2. CEA in 3 months with office visit, CEA, CBC, and chem profile in 6 months. Repeat CT scan in one year.   All questions were answered. The patient knows to call the clinic with any problems, questions or concerns. We can certainly see the patient much sooner if necessary.   I spent 25 minutes counseling the patient face to face. The total time spent in the appointment was 30 minutes.    Doroteo Bradford, MD 12/04/2013 10:14 AM

## 2013-12-11 NOTE — Discharge Summary (Signed)
Physician Discharge Summary  David Pugh BHA:[id number] DOB: [birth year] DOA: 11/24/2013  PCP: [name], MD  Admit date: 11/24/2013 Discharge date: 11/26/2013  Time spent: 45 minutes  Recommendations for Outpatient Follow-up:  1. PCP in 1 week  Discharge Diagnoses:  Principal Problem:   Viral gastroenteritis Active Problems:   GERD   Cancer of ascending colon   Peripheral neuropathy   CAD (coronary artery disease)   HTN (hypertension)   Nausea vomiting and diarrhea   Dehydration   Discharge Condition: stable  Diet recommendation: heart healthy  Filed Weights   11/24/13 2205 11/25/13 0527 11/26/13 9735  Weight: 99.9 kg (220 lb 3.8 oz) 103.329 kg (227 lb 12.8 oz) 103.919 kg (229 lb 1.6 oz)    History of present illness:  David Pugh is a 79 y.o. male with a past medical history of coronary artery disease with stent placement to Ramus intermedius, and left anterior descending in June of 2012, history of colon cancer diagnosed in 2013 status post right hemicolectomy, hypertension, peripheral neuropathy, prostate cancer status post radiation treatment, who was in his usual state of health till about 4 AM this morning when he woke up feeling nauseous. He subsequently started vomiting. And, then also started having diarrhea. He has had innumerable episodes of watery stool. He has noticed small amount of blood in the stool as well, but predominantly it has been yellow in color. He does have a history of hemorrhoids. The emesis has also been yellowish and bilious in nature. Had about 10-11 episodes of same. Denies any blood in the emesis. He had chills, but his temperature was normal at home this morning. His family members, including his wife and mother-in-law were affected with the flu about 2 weeks ago. On Friday he went to a [employer] for lunch. Last week he had had a dental procedure for which he was given antibiotics and he finished taking that medication on  Monday. He does not know the name of this antibiotic. Patient has been weak, but denies any dizziness or lightheadedness. Denies any abdominal pain per say. He does have a history of chronic back pain and as a result of numerous episodes of vomiting he has noted worsening pain in the back. Since the symptoms were not improving, he decided to come in to the hospital. Denies taking any medications at home for same.   Hospital Course:  1. Nausea/vomiting/diarrhea,  -suspect gastroenteritis related to food bone illness: C. Diff PCR is negative.  -Improved with supportive care, IVF, antiemetics  -no indication for Abx or imaging at this point  -improved and tolerating diet at time of discharge  2. Dehydration: Secondary to the above  -improved with rehydration  3. leukocytosis:  -Most likely due to dehydrated state and above  -improved   4. history of coronary artery disease:  - Stable. Continue with his home medications.   5. history of colon cancer  -follow up with his oncologist, He had a colonoscopy in December of 2014 and polyps were noted. Pathology did not show any malignant changes.   6. Hyperglycemia  - likely stress induced - hbaic 5.9  Discharge Exam: Filed Vitals:   11/26/13 0849  BP: 156/80  Pulse: 99  Temp:   Resp:     General: AAOx3 Cardiovascular: S1S2/RRR Respiratory: CTAB  Discharge Instructions You were cared for by a hospitalist during your hospital stay. If you have any questions about your discharge medications or the care you received while you were in the  hospital after you are discharged, you can call the unit and asked to speak with the hospitalist on call if the hospitalist that took care of you is not available. Once you are discharged, your primary care physician will handle any further medical issues. Please note that NO REFILLS for any discharge medications will be authorized once you are discharged, as it is imperative that you return to your  primary care physician (or establish a relationship with a primary care physician if you do not have one) for your aftercare needs so that they can reassess your need for medications and monitor your lab values.  Discharge Orders   Future Appointments Provider Department Dept Phone   12/18/2013 9:30 AM Kathrynn Ducking, MD [facility] [phone]   01/28/2014 4:15 PM Sanda Klein, MD [facility] [phone]   02/26/2014 9:10 AM [place] [phone]   05/21/2014 8:50 AM [place] [phone]   05/21/2014 9:00 AM [facility] [address] [phone]   Future Orders Complete By Expires   Diet - low sodium heart healthy  As directed    Increase activity slowly  As directed        Medication List         acetaminophen 650 MG CR tablet  Commonly known as:  TYLENOL  Take 650 mg by mouth 2 (two) times daily as needed for pain.     ALPRAZolam 0.25 MG tablet  Commonly known as:  XANAX  Take 0.25 mg by mouth at bedtime as needed. Anxiety.     aspirin EC 81 MG tablet  Take 81 mg by mouth daily.     cetirizine 10 MG tablet  Commonly known as:  ZYRTEC  Take 10 mg by mouth daily.     citalopram 10 MG tablet  Commonly known as:  CELEXA  Take 10 mg by mouth every morning.     folic acid 1 MG tablet  Commonly known as:  FOLVITE  Take 1 mg by mouth once a week. Sunday.     losartan 50 MG tablet  Commonly known as:  COZAAR  Take 25 mg by mouth 2 (two) times daily.     metoCLOPramide 10 MG tablet  Commonly known as:  REGLAN  Take 1 tablet (10 mg total) by mouth every 6 (six) hours as needed for nausea or vomiting.     metoprolol 50 MG tablet  Commonly known as:  LOPRESSOR  Take 50 mg by mouth 2 (two) times daily.     multivitamin with minerals Tabs tablet  Take 1 tablet by mouth daily.     nitroGLYCERIN 0.4 MG SL tablet  Commonly known as:  NITROSTAT  Place 0.4 mg  under the tongue every 5 (five) minutes x 3 doses as needed. For chest pain     omeprazole 40 MG capsule  Commonly known as:  PRILOSEC  Take 40 mg by mouth daily.     ondansetron 8 MG tablet  Commonly known as:  ZOFRAN  Take 8 mg by mouth every 8 (eight) hours as needed for nausea or vomiting.     pravastatin 40 MG tablet  Commonly known as:  PRAVACHOL  Take 40 mg by mouth at bedtime.     PROBIOTIC DAILY PO  Take 1 tablet by mouth daily.     VITAMIN B COMPLEX PO  Take 1 tablet by mouth daily.     XALATAN 0.005 % ophthalmic solution  Generic drug:  latanoprost  Place 1 drop into both eyes at bedtime.       Allergies  Allergen Reactions  . Shellfish Allergy Anaphylaxis  . Sulfa Antibiotics Diarrhea  . Sulfonamide Derivatives Diarrhea       Follow-up Information   Follow up with [name],[name], MD. Schedule an appointment as soon as possible for a visit in 1 week.   Specialty:  Family Medicine   Contact information:   [place] [address] [phone]        The results of significant diagnostics from this hospitalization (including imaging, microbiology, ancillary and laboratory) are listed below for reference.    Significant Diagnostic Studies: Ct Chest W Contrast  12/03/2013   CLINICAL DATA:  Restaging colon cancer  EXAM: CT CHEST, ABDOMEN, AND PELVIS WITH CONTRAST  TECHNIQUE: Multidetector CT imaging of the chest, abdomen and pelvis was performed following the standard protocol during bolus administration of intravenous contrast.  CONTRAST:  155mL OMNIPAQUE IOHEXOL 300 MG/ML  SOLN  COMPARISON:  03/12/2013  FINDINGS: CT CHEST FINDINGS  No pleural effusion identified. There is no airspace consolidation or atelectasis noted. Small granulomas are noted within the lung bases. No worrisome pulmonary nodule or mass.  Normal heart size. No pericardial effusion. Calcified atherosclerotic disease involves the thoracic aorta as well as the LAD and left  circumflex coronary arteries. There are no enlarged mediastinal or hilar lymph nodes. No axillary or supraclavicular adenopathy.  Review of the visualized osseous structures is significant for mild thoracic spondylosis. No aggressive lytic or sclerotic bone lesions.  CT ABDOMEN AND PELVIS FINDINGS  No suspicious liver abnormality identified. The patient is status post cholecystectomy. No biliary dilatation. Normal appearance of the pancreas. The spleen is unremarkable. The adrenal glands both appear normal. Normal appearance of the right kidney. There is a small cyst within the inferior pole of the left kidney measuring 1.3 cm, image 86/series 2.  The urinary bladder appears normal. Seed implants are identified within the prostate gland. Symmetric appearance of the seminal vesicles.  Normal caliber of the abdominal aorta. No aneurysm. There is no upper abdominal adenopathy. No pelvic or inguinal adenopathy identified. There is no free fluid or abnormal fluid collections within the abdomen or pelvis.  The stomach is normal. There is a small ventral abdominal wall hernia which contains a loop of nonobstructed small bowel, image 89/series 2. No obstruction. Status post right hemicolectomy. Scattered distal colonic diverticula noted.  Review of the visualized osseous structures is negative for aggressive lytic or sclerotic bone lesions.  IMPRESSION: 1. No specific features identified to suggest metastatic disease within the chest, abdomen or pelvis. 2. Atherosclerotic disease including multi vessel coronary artery calcifications. 3. Prior granulomatous disease.   Electronically Signed   By: Kerby Moors M.D.   On: 12/03/2013 13:46   Ct Abdomen Pelvis W Contrast  12/03/2013   CLINICAL DATA:  Restaging colon cancer  EXAM: CT CHEST, ABDOMEN, AND PELVIS WITH CONTRAST  TECHNIQUE: Multidetector CT imaging of the chest, abdomen and pelvis was performed following the standard protocol during bolus administration of  intravenous contrast.  CONTRAST:  1110mL OMNIPAQUE IOHEXOL 300 MG/ML  SOLN  COMPARISON:  03/12/2013  FINDINGS: CT CHEST FINDINGS  No pleural effusion identified. There is no airspace consolidation or atelectasis noted. Small granulomas are noted within the lung bases. No worrisome pulmonary nodule or mass.  Normal heart size. No pericardial effusion. Calcified atherosclerotic disease involves the thoracic aorta as well as the LAD  and left circumflex coronary arteries. There are no enlarged mediastinal or hilar lymph nodes. No axillary or supraclavicular adenopathy.  Review of the visualized osseous structures is significant for mild thoracic spondylosis. No aggressive lytic or sclerotic bone lesions.  CT ABDOMEN AND PELVIS FINDINGS  No suspicious liver abnormality identified. The patient is status post cholecystectomy. No biliary dilatation. Normal appearance of the pancreas. The spleen is unremarkable. The adrenal glands both appear normal. Normal appearance of the right kidney. There is a small cyst within the inferior pole of the left kidney measuring 1.3 cm, image 86/series 2.  The urinary bladder appears normal. Seed implants are identified within the prostate gland. Symmetric appearance of the seminal vesicles.  Normal caliber of the abdominal aorta. No aneurysm. There is no upper abdominal adenopathy. No pelvic or inguinal adenopathy identified. There is no free fluid or abnormal fluid collections within the abdomen or pelvis.  The stomach is normal. There is a small ventral abdominal wall hernia which contains a loop of nonobstructed small bowel, image 89/series 2. No obstruction. Status post right hemicolectomy. Scattered distal colonic diverticula noted.  Review of the visualized osseous structures is negative for aggressive lytic or sclerotic bone lesions.  IMPRESSION: 1. No specific features identified to suggest metastatic disease within the chest, abdomen or pelvis. 2. Atherosclerotic disease including  multi vessel coronary artery calcifications. 3. Prior granulomatous disease.   Electronically Signed   By: Kerby Moors M.D.   On: 12/03/2013 13:46    Microbiology: No results found for this or any previous visit (from the past 240 hour(s)).   Labs: Basic Metabolic Panel: No results found for this basename: NA, K, CL, CO2, GLUCOSE, BUN, CREATININE, CALCIUM, MG, PHOS,  in the last 168 hours Liver Function Tests: No results found for this basename: AST, ALT, ALKPHOS, BILITOT, PROT, ALBUMIN,  in the last 168 hours No results found for this basename: LIPASE, AMYLASE,  in the last 168 hours No results found for this basename: AMMONIA,  in the last 168 hours CBC: No results found for this basename: WBC, NEUTROABS, HGB, HCT, MCV, PLT,  in the last 168 hours Cardiac Enzymes: No results found for this basename: CKTOTAL, CKMB, CKMBINDEX, TROPONINI,  in the last 168 hours BNP: BNP (last 3 results) No results found for this basename: PROBNP,  in the last 8760 hours CBG: No results found for this basename: GLUCAP,  in the last 168 hours     Signed:  Domenic Polite  [facility] 12/11/2013, 4:22 PM

## 2013-12-18 ENCOUNTER — Ambulatory Visit (INDEPENDENT_AMBULATORY_CARE_PROVIDER_SITE_OTHER): Payer: Medicare Other | Admitting: Neurology

## 2013-12-18 ENCOUNTER — Encounter: Payer: Self-pay | Admitting: Neurology

## 2013-12-18 VITALS — BP 130/70 | HR 62 | Ht 71.0 in | Wt 222.0 lb

## 2013-12-18 DIAGNOSIS — R269 Unspecified abnormalities of gait and mobility: Secondary | ICD-10-CM

## 2013-12-18 DIAGNOSIS — G609 Hereditary and idiopathic neuropathy, unspecified: Secondary | ICD-10-CM

## 2013-12-18 DIAGNOSIS — G629 Polyneuropathy, unspecified: Secondary | ICD-10-CM

## 2013-12-18 DIAGNOSIS — D518 Other vitamin B12 deficiency anemias: Secondary | ICD-10-CM

## 2013-12-18 HISTORY — DX: Unspecified abnormalities of gait and mobility: R26.9

## 2013-12-18 NOTE — Patient Instructions (Signed)

## 2013-12-18 NOTE — Progress Notes (Signed)
Reason for visit: Peripheral neuropathy  David Pugh is a 79 y.o. male  History of present illness:  Mr. Aspinall is a 79 year old right-handed white male with a history of a peripheral neuropathy with symptom onset around 2000. The patient was last seen through this office in 2009. Nerve conduction studies at that time showed a moderate to severe primarily axonal peripheral neuropathy. The patient has reported a gait disturbance, and he will use a walking stick when he is on uneven ground. The last fall was about 2 weeks ago. The patient reports that he has no discomfort with the neuropathy with exception that on the right foot, with the third, fourth, and fifth toes will have a burning sensation when he is up on his feet, and he denies any discomfort when he is off of his feet. The patient has mild hammertoes on both feet, right greater than left. The patient denies back pain or pain down the legs and he denies neck pain or pain down the arms. The patient denies problems controlling the bowels or the bladder. The patient comes to this office for further evaluation. The patient reports that his brother also has a peripheral neuropathy.  Past Medical History  Diagnosis Date  . Premature ventricular contraction   . Prostate cancer 2010  . 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 Radiation Feb 2011    treatment  . Biliary dyskinesia   . GERD (gastroesophageal reflux disease)   . Hiatal hernia 2010    tiny  . HTN (hypertension)   . Rosacea   . PTSD (post-traumatic stress disorder)   . Angina pectoris, unstable   . Diverticula of colon 2010  . Colon polyps     tubular adenomas  . Arthritis   . Exposure to [employer]   . CAD (coronary artery disease)     hx of 2 stents   . Peripheral neuropathy   . Anemia     hx of anemia as a child   . Colon cancer 12/2011    s/p right hemicolectomy, did not tolerate chemo  . Iron deficiency 05/02/2012    followed by Dr. Tressie [name], iron  infusions.   . Folic acid deficiency   . Heart palpitations 10/2012  . Gastroparesis 04/12/2013    borderline  . Myocardial infarction 3 yrs ago  . Glaucoma   . Abnormality of gait 12/18/2013    Past Surgical History  Procedure Laterality Date  . Cholecystectomy    . Prostatectomy    . Tonsillectomy    . Transurethral resection of prostate    . Esophagogastroduodenoscopy  10/10/2008    Dr. Tilda Burrow hiatal hernia, normal esophagus, normal stomach  . Colonoscopy  02/12/2004    Dr. Gala Romney- L side diverticula, inflammatory  colon polyps  . Esophagogastroduodenoscopy  02/12/2004    Dr. Dudley Major- normal   . Coronary stent placement  01/2011    2.5x45mm Xience drug-eluting stent in ramus intermedius/OMI vessel, 2.5x13mm Promus Element stent in mid LAD artery  . 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 Multiple colonic  polyps removed as described above. Sigmoid Diverticulosis/ 1.5 x 2 cm relatively flat ulcerated lesion in cecum/path showed adenocarcinoma of the colon. descending colon polyp with tubular adenoma and one fragment of focal high grade dysplasia.   . Knee surgery      left knee   . Partial colectomy  02/29/2012  Procedure: PARTIAL COLECTOMY;  Surgeon: Adin Hector, MD;  Location: [facility];  Service: General;  Laterality: Right;  . Vasectomy    . Colonoscopy  08/28/2012    JSE:[id number] proctitis. Colonic diverticulosis/Ulcerations at the surgical anastomosis  path: ulcerated colonic mucosa with prolapse changes, tubular adenoma.   . Cardiac catheterization  02/13/2011    [facility] cardiology  . Cataract extraction      2011  . Colonoscopy N/A 08/02/2013    Procedure: COLONOSCOPY;  Surgeon: Daneil Dolin, MD;  Location: [facility] SUITE;  Service: Endoscopy;  Laterality: N/A;  1200  . Cataract extraction w/phaco Right 08/06/2013    Procedure: CATARACT EXTRACTION PHACO AND INTRAOCULAR LENS  PLACEMENT (IOC);  Surgeon: Tonny Branch, MD;  Location: [facility] ORS;  Service: Ophthalmology;  Laterality: Right;  CDE:20.96    Family History  Problem Relation Age of Onset  . Throat cancer Father   . Cancer Father     Throat  . Prostate cancer Brother   . Cancer Brother     Prostate and Skin  . Neuropathy Brother   . Colon cancer Neg Hx     Social history:  reports that he has never smoked. He has never used smokeless tobacco. He reports that he drinks alcohol. He reports that he does not use illicit drugs.  Medications:  Current Outpatient Prescriptions on File Prior to Visit  Medication Sig Dispense Refill  . acetaminophen (TYLENOL) 650 MG CR tablet Take 650 mg by mouth 2 (two) times daily as needed for pain.       Marland Kitchen ALPRAZolam (XANAX) 0.25 MG tablet Take 0.25 mg by mouth at bedtime as needed. Anxiety.      Marland Kitchen aspirin EC 81 MG tablet Take 81 mg by mouth daily.      . B Complex Vitamins (VITAMIN B COMPLEX PO) Take 1 tablet by mouth daily.       . cetirizine (ZYRTEC) 10 MG tablet Take 10 mg by mouth daily.      . citalopram (CELEXA) 10 MG tablet Take 10 mg by mouth every morning.       . folic acid (FOLVITE) 1 MG tablet Take 1 mg by mouth once a week. Sunday.      . latanoprost (XALATAN) 0.005 % ophthalmic solution Place 1 drop into both eyes at bedtime.       Marland Kitchen losartan (COZAAR) 50 MG tablet Take 25 mg by mouth 2 (two) times daily.       . metoCLOPramide (REGLAN) 10 MG tablet Take 1 tablet (10 mg total) by mouth every 6 (six) hours as needed for nausea or vomiting.  15 tablet  0  . metoprolol (LOPRESSOR) 50 MG tablet Take 50 mg by mouth 2 (two) times daily.       . Multiple Vitamin (MULTIVITAMIN WITH MINERALS) TABS tablet Take 1 tablet by mouth daily.      . nitroGLYCERIN (NITROSTAT) 0.4 MG SL tablet Place 0.4 mg under the tongue every 5 (five) minutes x 3 doses as needed. For chest pain      . omeprazole (PRILOSEC) 40 MG capsule Take 40 mg by mouth daily.      . ondansetron (ZOFRAN) 8  MG tablet Take 8 mg by mouth every 8 (eight) hours as needed for nausea or vomiting.      . pravastatin (PRAVACHOL) 40 MG tablet Take 40 mg by mouth at bedtime.       . Probiotic Product (PROBIOTIC DAILY PO) Take 1 tablet by  mouth daily.        No current facility-administered medications on file prior to visit.      Allergies  Allergen Reactions  . Shellfish Allergy Anaphylaxis  . Sulfa Antibiotics Diarrhea  . Sulfonamide Derivatives Diarrhea    ROS:  Out of a complete 14 system review of symptoms, the patient complains only of the following symptoms, and all other reviewed systems are negative.  Hearing loss Loss of vision, left eye Impotence Allergies Numbness Gait disturbance  Blood pressure 130/70, pulse 62, height 5\' 11"  (1.803 m), weight 222 lb (100.699 kg).  Physical Exam  General: The patient is alert and cooperative at the time of the examination.  Eyes: Pupils are equal, round, and reactive to light. Discs are flat bilaterally.  Neck: The neck is supple, no carotid bruits are noted.  Respiratory: The respiratory examination is clear.  Cardiovascular: The cardiovascular examination reveals a regular rate and rhythm, no obvious murmurs or rubs are noted.  Skin: Extremities are without significant edema.  Neurologic Exam  Mental status: The patient is alert and oriented x 3 at the time of the examination. The patient has apparent normal recent and remote memory, with an apparently normal attention span and concentration ability.  Cranial nerves: Facial symmetry is present. There is good sensation of the face to pinprick and soft touch bilaterally. The strength of the facial muscles and the muscles to head turning and shoulder shrug are normal bilaterally. Speech is well enunciated, no aphasia or dysarthria is noted. Extraocular movements are full. Visual fields are full. The tongue is midline, and the patient has symmetric elevation of the soft palate. No obvious  hearing deficits are noted.  Motor: The motor testing reveals 5 over 5 strength of all 4 extremities. Good symmetric motor tone is noted throughout.  Sensory: Sensory testing is intact to pinprick, soft touch, vibration sensation, and position sense on the upper extremities. With the lower extremities, no stocking pattern pinprick sensory deficit is noted. The patient has significant impairment of vibration and position sense in both feet. No evidence of extinction is noted.  Coordination: Cerebellar testing reveals good finger-nose-finger and heel-to-shin bilaterally.  Gait and station: Gait is wide-based, unsteady. Tandem gait is unsteady. Romberg is negative. No drift is seen.  Reflexes: Deep tendon reflexes are symmetric, but are depressed bilaterally. Toes are downgoing bilaterally.   Assessment/Plan  1. Peripheral neuropathy  2. Gait disturbance  The patient has a documented history of peripheral neuropathy. The patient reports pain with weightbearing, and no pain when he is off of his feet. This is unusual for a true peripheral neuropathy, and a mechanical source pain should be suspected. The patient will be given a trial on a topical agent for the right foot to be taken in the afternoon when the pain becomes a problem. The patient will followup through this office in about 6 months. The patient will undergo blood work evaluation today.  Jill Alexanders MD 12/18/2013 8:32 PM  [facility] [address] [place], [place] [address]  Phone [phone] Fax [phone]

## 2013-12-20 LAB — IFE AND PE, SERUM
Albumin SerPl Elph-Mcnc: 3.8 g/dL (ref 3.2–5.6)
Albumin/Glob SerPl: 1.5 (ref 0.7–2.0)
Alpha 1: 0.2 g/dL (ref 0.1–0.4)
Alpha2 Glob SerPl Elph-Mcnc: 0.7 g/dL (ref 0.4–1.2)
B-Globulin SerPl Elph-Mcnc: 0.8 g/dL (ref 0.6–1.3)
Gamma Glob SerPl Elph-Mcnc: 0.9 g/dL (ref 0.5–1.6)
Globulin, Total: 2.6 g/dL (ref 2.0–4.5)
IGM (IMMUNOGLOBULIN M), SRM: 59 mg/dL (ref 40–230)
IgA/Immunoglobulin A, Serum: 197 mg/dL (ref 91–414)
IgG (Immunoglobin G), Serum: 1000 mg/dL (ref 700–1600)
TOTAL PROTEIN: 6.4 g/dL (ref 6.0–8.5)

## 2013-12-20 LAB — ANA W/REFLEX: ANA: NEGATIVE

## 2013-12-20 LAB — RPR: RPR: NONREACTIVE

## 2013-12-20 LAB — VITAMIN B12: Vitamin B-12: 449 pg/mL (ref 211–946)

## 2013-12-20 LAB — ANGIOTENSIN CONVERTING ENZYME: ANGIO CONVERT ENZYME: 49 U/L (ref 14–82)

## 2013-12-20 LAB — RHEUMATOID FACTOR: Rhuematoid fact SerPl-aCnc: <7 IU/mL (ref 0.0–13.9)

## 2013-12-20 LAB — COPPER, SERUM: COPPER: 124 ug/dL (ref 72–166)

## 2014-01-08 ENCOUNTER — Other Ambulatory Visit: Payer: Self-pay

## 2014-01-08 ENCOUNTER — Emergency Department (HOSPITAL_COMMUNITY): Payer: Medicare Other

## 2014-01-08 ENCOUNTER — Emergency Department (HOSPITAL_COMMUNITY)
Admission: EM | Admit: 2014-01-08 | Discharge: 2014-01-09 | Disposition: A | Discharge status: Home / Self Care | Payer: Medicare Other | Attending: Emergency Medicine | Admitting: Emergency Medicine

## 2014-01-08 ENCOUNTER — Encounter (HOSPITAL_COMMUNITY): Payer: Self-pay | Admitting: Emergency Medicine

## 2014-01-08 DIAGNOSIS — R071 Chest pain on breathing: Secondary | ICD-10-CM | POA: Insufficient documentation

## 2014-01-08 DIAGNOSIS — D509 Iron deficiency anemia, unspecified: Secondary | ICD-10-CM | POA: Insufficient documentation

## 2014-01-08 DIAGNOSIS — Z9861 Coronary angioplasty status: Secondary | ICD-10-CM | POA: Insufficient documentation

## 2014-01-08 DIAGNOSIS — R0789 Other chest pain: Secondary | ICD-10-CM

## 2014-01-08 DIAGNOSIS — Z7982 Long term (current) use of aspirin: Secondary | ICD-10-CM | POA: Insufficient documentation

## 2014-01-08 DIAGNOSIS — I209 Angina pectoris, unspecified: Secondary | ICD-10-CM | POA: Insufficient documentation

## 2014-01-08 DIAGNOSIS — Z872 Personal history of diseases of the skin and subcutaneous tissue: Secondary | ICD-10-CM | POA: Insufficient documentation

## 2014-01-08 DIAGNOSIS — E538 Deficiency of other specified B group vitamins: Secondary | ICD-10-CM | POA: Insufficient documentation

## 2014-01-08 DIAGNOSIS — I2510 Atherosclerotic heart disease of native coronary artery without angina pectoris: Secondary | ICD-10-CM | POA: Insufficient documentation

## 2014-01-08 DIAGNOSIS — Z8669 Personal history of other diseases of the nervous system and sense organs: Secondary | ICD-10-CM | POA: Insufficient documentation

## 2014-01-08 DIAGNOSIS — M129 Arthropathy, unspecified: Secondary | ICD-10-CM | POA: Insufficient documentation

## 2014-01-08 DIAGNOSIS — Z79899 Other long term (current) drug therapy: Secondary | ICD-10-CM | POA: Insufficient documentation

## 2014-01-08 DIAGNOSIS — Z860100 Personal history of colon polyps, unspecified: Secondary | ICD-10-CM | POA: Insufficient documentation

## 2014-01-08 DIAGNOSIS — K219 Gastro-esophageal reflux disease without esophagitis: Secondary | ICD-10-CM | POA: Insufficient documentation

## 2014-01-08 DIAGNOSIS — K3184 Gastroparesis: Secondary | ICD-10-CM | POA: Insufficient documentation

## 2014-01-08 DIAGNOSIS — Z9889 Other specified postprocedural states: Secondary | ICD-10-CM | POA: Insufficient documentation

## 2014-01-08 DIAGNOSIS — Z85038 Personal history of other malignant neoplasm of large intestine: Secondary | ICD-10-CM | POA: Insufficient documentation

## 2014-01-08 DIAGNOSIS — Z8659 Personal history of other mental and behavioral disorders: Secondary | ICD-10-CM | POA: Insufficient documentation

## 2014-01-08 DIAGNOSIS — I252 Old myocardial infarction: Secondary | ICD-10-CM | POA: Insufficient documentation

## 2014-01-08 DIAGNOSIS — I10 Essential (primary) hypertension: Secondary | ICD-10-CM | POA: Insufficient documentation

## 2014-01-08 LAB — CBC WITH DIFFERENTIAL/PLATELET
BASOS ABS: 0 10*3/uL (ref 0.0–0.1)
Basophils Relative: 0 % (ref 0–1)
EOS ABS: 0.7 10*3/uL (ref 0.0–0.7)
Eosinophils Relative: 5 % (ref 0–5)
HEMATOCRIT: 41.2 % (ref 39.0–52.0)
HEMOGLOBIN: 14 g/dL (ref 13.0–17.0)
LYMPHS PCT: 37 % (ref 12–46)
Lymphs Abs: 4.8 10*3/uL — ABNORMAL HIGH (ref 0.7–4.0)
MCH: 30.9 pg (ref 26.0–34.0)
MCHC: 34 g/dL (ref 30.0–36.0)
MCV: 90.9 fL (ref 78.0–100.0)
MONO ABS: 1.6 10*3/uL — AB (ref 0.1–1.0)
Monocytes Relative: 12 % (ref 3–12)
NEUTROS ABS: 5.9 10*3/uL (ref 1.7–7.7)
NEUTROS PCT: 46 % (ref 43–77)
Platelets: 322 10*3/uL (ref 150–400)
RBC: 4.53 MIL/uL (ref 4.22–5.81)
RDW: 13.5 % (ref 11.5–15.5)
WBC: 13 10*3/uL — ABNORMAL HIGH (ref 4.0–10.5)

## 2014-01-08 LAB — TROPONIN I

## 2014-01-08 LAB — BASIC METABOLIC PANEL
BUN: 16 mg/dL (ref 6–23)
CHLORIDE: 104 meq/L (ref 96–112)
CO2: 22 mEq/L (ref 19–32)
Calcium: 9.1 mg/dL (ref 8.4–10.5)
Creatinine, Ser: 1.16 mg/dL (ref 0.50–1.35)
GFR calc non Af Amer: 59 mL/min — ABNORMAL LOW (ref >90)
GFR, EST AFRICAN AMERICAN: 68 mL/min — AB (ref >90)
Glucose, Bld: 139 mg/dL — ABNORMAL HIGH (ref 70–99)
Potassium: 4 mEq/L (ref 3.7–5.3)
Sodium: 141 mEq/L (ref 137–147)

## 2014-01-08 MED ORDER — TRAMADOL HCL 50 MG PO TABS
50.0000 mg | ORAL_TABLET | Freq: Once | ORAL | Status: AC
  Administered 2014-01-08: 50 mg via ORAL
  Filled 2014-01-08: qty 1

## 2014-01-08 NOTE — ED Provider Notes (Signed)
CSN: [id number]     Arrival date & time 01/08/14  2223 History   First MD Initiated Contact with Patient 01/08/14 2314  This chart was scribed for David Diego, MD by Anastasia Pall, ED Scribe. This patient was seen in room APA02/APA02 and the patient's care was started at 11:21 PM.    Chief Complaint  Patient presents with  . Chest Pain   (Consider location/radiation/quality/duration/timing/severity/associated sxs/prior Treatment) Patient is a 79 y.o. male presenting with chest pain. The history is provided by the patient. No language interpreter was used.  Chest Pain Pain location:  R chest Pain quality: dull and sharp   Pain radiates to:  R arm Onset quality:  Gradual Duration:  1 day (last night) Timing:  Constant Progression:  Worsening Associated symptoms: no abdominal pain, no back pain, no cough, no fatigue, no headache and no shortness of breath    HPI Comments: David Pugh is a 79 y.o. male who presents to the Emergency Department complaining of waxing and waning, dull, right sided chest pain, onset last night, with worsened chest pain today reporting intermittent sharp pains. He states his chest pain radiates into his right arm. He reports he has been working on a machine recently where he has been doing a lot of right arm movement repetition. He reports having stent placed 3 years ago. He states it has been a while since he was seen by his cardiologist. He has appointment end of this month. He reports having stress test 1 year ago, but denies having catheterization. He denies SOB, and any other associated symptoms. He reports h/o colon cancer, diagnosed 2 years ago.   PCP - [name], MD  Past Medical History  Diagnosis Date  . Premature ventricular contraction   . Prostate cancer 2010  . 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 Radiation Feb 2011    treatment  . Biliary dyskinesia   . GERD (gastroesophageal reflux disease)   . Hiatal hernia 2010    tiny  .  HTN (hypertension)   . Rosacea   . PTSD (post-traumatic stress disorder)   . Angina pectoris, unstable   . Diverticula of colon 2010  . Colon polyps     tubular adenomas  . Arthritis   . Exposure to [employer]   . CAD (coronary artery disease)     hx of 2 stents   . Peripheral neuropathy   . Anemia     hx of anemia as a child   . Colon cancer 12/2011    s/p right hemicolectomy, did not tolerate chemo  . Iron deficiency 05/02/2012    followed by Dr. Tressie [name], iron infusions.   . Folic acid deficiency   . Heart palpitations 10/2012  . Gastroparesis 04/12/2013    borderline  . Myocardial infarction 3 yrs ago  . Glaucoma   . Abnormality of gait 12/18/2013   Past Surgical History  Procedure Laterality Date  . Cholecystectomy    . Prostatectomy    . Tonsillectomy    . Transurethral resection of prostate    . Esophagogastroduodenoscopy  10/10/2008    Dr. Tilda Burrow hiatal hernia, normal esophagus, normal stomach  . Colonoscopy  02/12/2004    Dr. Gala Romney- L side diverticula, inflammatory  colon polyps  . Esophagogastroduodenoscopy  02/12/2004    Dr. Dudley Major- normal   . Coronary stent placement  01/2011    2.5x21mm Xience drug-eluting stent in ramus intermedius/OMI vessel, 2.5x6mm Promus Element stent in  mid LAD artery  . 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 Multiple colonic  polyps removed as described above. Sigmoid Diverticulosis/ 1.5 x 2 cm relatively flat ulcerated lesion in cecum/path showed adenocarcinoma of the colon. descending colon polyp with tubular adenoma and one fragment of focal high grade dysplasia.   . Knee surgery      left knee   . Partial colectomy  02/29/2012    Procedure: PARTIAL COLECTOMY;  Surgeon: Adin Hector, MD;  Location: [facility];  Service: General;  Laterality: Right;  . Vasectomy    . Colonoscopy  08/28/2012    [id number] proctitis. Colonic diverticulosis/Ulcerations at  the surgical anastomosis  path: ulcerated colonic mucosa with prolapse changes, tubular adenoma.   . Cardiac catheterization  02/13/2011    [facility] cardiology  . Cataract extraction      2011  . Colonoscopy N/A 08/02/2013    Procedure: COLONOSCOPY;  Surgeon: Daneil Dolin, MD;  Location: [facility];  Service: Endoscopy;  Laterality: N/A;  1200  . Cataract extraction w/phaco Right 08/06/2013    Procedure: CATARACT EXTRACTION PHACO AND INTRAOCULAR LENS PLACEMENT (IOC);  Surgeon: Tonny Branch, MD;  Location: [facility];  Service: Ophthalmology;  Laterality: Right;  CDE:20.96   Family History  Problem Relation Age of Onset  . Throat cancer Father   . Cancer Father     Throat  . Prostate cancer Brother   . Cancer Brother     Prostate and Skin  . Neuropathy Brother   . Colon cancer Neg Hx    History  Substance Use Topics  . Smoking status: Never Smoker   . Smokeless tobacco: Never Used  . Alcohol Use: Yes     Comment: 1 cocktail/month    Review of Systems  Constitutional: Negative for appetite change and fatigue.  HENT: Negative for congestion, ear discharge and sinus pressure.   Eyes: Negative for discharge.  Respiratory: Negative for cough and shortness of breath.   Cardiovascular: Positive for chest pain.  Gastrointestinal: Negative for abdominal pain and diarrhea.  Genitourinary: Negative for frequency and hematuria.  Musculoskeletal: Negative for back pain.  Skin: Negative for rash.  Neurological: Negative for seizures and headaches.  Psychiatric/Behavioral: Negative for hallucinations.   Allergies  Shellfish allergy; Sulfa antibiotics; and Sulfonamide derivatives  Home Medications   Prior to Admission medications   Medication Sig Start Date End Date Taking? Authorizing Provider  acetaminophen (TYLENOL) 650 MG CR tablet Take 650 mg by mouth 2 (two) times daily as needed for pain.     Historical Provider, MD  ALPRAZolam Duanne Moron) 0.25 MG tablet Take 0.25 mg by  mouth at bedtime as needed. Anxiety. 03/24/12   Pieter Partridge, MD  aspirin EC 81 MG tablet Take 81 mg by mouth daily.    Historical Provider, MD  B Complex Vitamins (VITAMIN B COMPLEX PO) Take 1 tablet by mouth daily.     Historical Provider, MD  cetirizine (ZYRTEC) 10 MG tablet Take 10 mg by mouth daily.    Historical Provider, MD  citalopram (CELEXA) 10 MG tablet Take 10 mg by mouth every morning.     Historical Provider, MD  folic acid (FOLVITE) 1 MG tablet Take 1 mg by mouth once a week. Sunday. 03/27/12   Pieter Partridge, MD  latanoprost (XALATAN) 0.005 % ophthalmic solution Place 1 drop into both eyes at bedtime.     Historical Provider, MD  losartan (COZAAR) 50  MG tablet Take 25 mg by mouth 2 (two) times daily.     Historical Provider, MD  metoCLOPramide (REGLAN) 10 MG tablet Take 1 tablet (10 mg total) by mouth every 6 (six) hours as needed for nausea or vomiting. 10/26/13   [name], MD  metoprolol (LOPRESSOR) 50 MG tablet Take 50 mg by mouth 2 (two) times daily.     Historical Provider, MD  Multiple Vitamin (MULTIVITAMIN WITH MINERALS) TABS tablet Take 1 tablet by mouth daily.    Historical Provider, MD  nitroGLYCERIN (NITROSTAT) 0.4 MG SL tablet Place 0.4 mg under the tongue every 5 (five) minutes x 3 doses as needed. For chest pain    Historical Provider, MD  omeprazole (PRILOSEC) 40 MG capsule Take 40 mg by mouth daily.    Historical Provider, MD  ondansetron (ZOFRAN) 8 MG tablet Take 8 mg by mouth every 8 (eight) hours as needed for nausea or vomiting.    Historical Provider, MD  pravastatin (PRAVACHOL) 40 MG tablet Take 40 mg by mouth at bedtime.     Historical Provider, MD  Probiotic Product (PROBIOTIC DAILY PO) Take 1 tablet by mouth daily.     Historical Provider, MD   BP 154/83  Pulse 66  Temp(Src) 97.9 F (36.6 C) (Oral)  Resp 20  Ht 5\' 11"  (1.803 m)  Wt 222 lb (100.699 kg)  BMI 30.98 kg/m2  SpO2 99%  Physical Exam  Constitutional: He is oriented to person,  place, and time. He appears well-developed and well-nourished. No distress.  HENT:  Head: Normocephalic and atraumatic.  Eyes: Conjunctivae and EOM are normal. No scleral icterus.  Neck: Neck supple.  Cardiovascular: Normal rate, regular rhythm and normal heart sounds.  Exam reveals no gallop and no friction rub.   No murmur heard. Pulmonary/Chest: Effort normal.  Musculoskeletal: Normal range of motion. He exhibits no edema.  Neurological: He is oriented to person, place, and time. He exhibits normal muscle tone. Coordination normal.  Skin: No rash noted. No erythema.  Psychiatric: He has a normal mood and affect. His behavior is normal.   ED Course  Procedures (including critical care time)  DIAGNOSTIC STUDIES: Oxygen Saturation is 99% on room air, normal by my interpretation.    COORDINATION OF CARE: 11:26 PM-Discussed treatment plan with pt at bedside and pt agreed to plan.   Results for orders placed during the hospital encounter of 01/08/14  CBC WITH DIFFERENTIAL      Result Value Ref Range   WBC 13.0 (*) 4.0 - 10.5 K/uL   RBC 4.53  4.22 - 5.81 MIL/uL   Hemoglobin 14.0  13.0 - 17.0 g/dL   HCT 41.2  39.0 - 52.0 %   MCV 90.9  78.0 - 100.0 fL   MCH 30.9  26.0 - 34.0 pg   MCHC 34.0  30.0 - 36.0 g/dL   RDW 13.5  11.5 - 15.5 %   Platelets 322  150 - 400 K/uL   Neutrophils Relative % 46  43 - 77 %   Lymphocytes Relative 37  12 - 46 %   Monocytes Relative 12  3 - 12 %   Eosinophils Relative 5  0 - 5 %   Basophils Relative 0  0 - 1 %   Neutro Abs 5.9  1.7 - 7.7 K/uL   Lymphs Abs 4.8 (*) 0.7 - 4.0 K/uL   Monocytes Absolute 1.6 (*) 0.1 - 1.0 K/uL   Eosinophils Absolute 0.7  0.0 - 0.7 K/uL  Basophils Absolute 0.0  0.0 - 0.1 K/uL  BASIC METABOLIC PANEL      Result Value Ref Range   Sodium 141  137 - 147 mEq/L   Potassium 4.0  3.7 - 5.3 mEq/L   Chloride 104  96 - 112 mEq/L   CO2 22  19 - 32 mEq/L   Glucose, Bld 139 (*) 70 - 99 mg/dL   BUN 16  6 - 23 mg/dL   Creatinine,  Ser 1.16  0.50 - 1.35 mg/dL   Calcium 9.1  8.4 - 10.5 mg/dL   GFR calc non Af Amer 59 (*) >90 mL/min   GFR calc Af Amer 68 (*) >90 mL/min  TROPONIN I      Result Value Ref Range   Troponin I <0.30  <0.30 ng/mL   Dg Chest 2 View  01/08/2014   CLINICAL DATA:  Chest pain  EXAM: CHEST  2 VIEW  COMPARISON:  10/14/2012  FINDINGS: Cardiac shadow is within normal limits. The lungs are mildly hyperinflated. No focal infiltrate or sizable effusion is seen. No acute bony abnormality is noted.  IMPRESSION: No active cardiopulmonary disease.   Electronically Signed   By: Inez Catalina M.D.   On: 01/08/2014 23:06    EKG Interpretation None     Medications  traMADol (ULTRAM) tablet 50 mg (50 mg Oral Given 01/08/14 2341)   MDM   Final diagnoses:  None  The chart was scribed for me under my direct supervision.  I personally performed the history, physical, and medical decision making and all procedures in the evaluation of this patient.David Diego, MD 01/09/14 [phone]

## 2014-01-08 NOTE — ED Notes (Signed)
Patient reports took 1 SL nitroglycerin PTA with no relief.

## 2014-01-08 NOTE — ED Notes (Signed)
Patient reports right-sided sharp chest pain that started last night and has worsened today. States radiation under and into right arm.

## 2014-01-09 LAB — TROPONIN I: Troponin I: <0.3 ng/mL (ref <0.30)

## 2014-01-09 MED ORDER — TRAMADOL HCL 50 MG PO TABS
ORAL_TABLET | ORAL | Status: AC
  Administered 2014-01-09: 03:00:00
  Filled 2014-01-09: qty 1

## 2014-01-09 MED ORDER — TRAMADOL HCL 50 MG PO TABS
50.0000 mg | ORAL_TABLET | Freq: Four times a day (QID) | ORAL | Status: DC | PRN
Start: 2014-01-09 — End: 2014-06-16

## 2014-01-09 NOTE — Discharge Instructions (Signed)
Follow up with your md in 2-3 days for recheck.  Return as needed

## 2014-01-28 ENCOUNTER — Ambulatory Visit (INDEPENDENT_AMBULATORY_CARE_PROVIDER_SITE_OTHER): Payer: Medicare Other | Admitting: Cardiovascular Disease

## 2014-01-28 ENCOUNTER — Encounter: Payer: Self-pay | Admitting: Cardiovascular Disease

## 2014-01-28 VITALS — BP 120/60 | HR 70 | Resp 16 | Ht 70.0 in | Wt 212.0 lb

## 2014-01-28 DIAGNOSIS — E782 Mixed hyperlipidemia: Secondary | ICD-10-CM

## 2014-01-28 DIAGNOSIS — I2510 Atherosclerotic heart disease of native coronary artery without angina pectoris: Secondary | ICD-10-CM

## 2014-01-28 DIAGNOSIS — E785 Hyperlipidemia, unspecified: Secondary | ICD-10-CM

## 2014-01-28 DIAGNOSIS — B029 Zoster without complications: Secondary | ICD-10-CM

## 2014-01-28 DIAGNOSIS — I25810 Atherosclerosis of coronary artery bypass graft(s) without angina pectoris: Secondary | ICD-10-CM

## 2014-01-28 DIAGNOSIS — Z79899 Other long term (current) drug therapy: Secondary | ICD-10-CM

## 2014-01-28 NOTE — Progress Notes (Signed)
Patient ID: David Pugh, male   DOB: December 08, 1934, 79 y.o.   MRN: [id number]      Reason for office visit CAD, hyperlipidemia  David Pugh has no cardiac complaints but has severe lancinating right-sided chest pain related to an attack of shingles in the fifth right intercostal nerve distribution. The pain is so severe that he needs narcotic analgesics 4 times daily. It wakes him up from sleep at night when the medicine wears off. The rash has started to wane. It has been 3 weeks since the onset of his neuralgia. He did take a full course of Valtrex. The entire family had viral gastroenteritis just before the onset of his shingles. He did receive a shingles vaccine about 5 years ago.   He has lost 10 pounds since his last appointment but remains borderline obese with a BMI of 30.  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 He did remarkably well in cardiac rehabilitation but not long afterwards was diagnosed with colon cancer. He underwent curative resection. Was found to have 2 positive lymph nodes. He could not tolerate chemotherapy. He is still considered to be in remission.   Allergies  Allergen Reactions  . Shellfish Allergy Anaphylaxis  . Sulfa Antibiotics Diarrhea  . Sulfonamide Derivatives Diarrhea    Current Outpatient Prescriptions  Medication Sig Dispense Refill  . acetaminophen (TYLENOL) 650 MG CR tablet Take 650 mg by mouth 2 (two) times daily as needed for pain.       Marland Kitchen ALPRAZolam (XANAX) 0.25 MG tablet Take 0.25 mg by mouth at bedtime as needed. Anxiety.      Marland Kitchen aspirin EC 81 MG tablet Take 81 mg by mouth daily.      . B Complex Vitamins (VITAMIN B COMPLEX PO) Take 1 tablet by mouth daily.       . cetirizine (ZYRTEC) 10 MG tablet Take 10 mg by mouth daily.      . citalopram (CELEXA) 10 MG tablet Take 10 mg by  mouth every morning.       . folic acid (FOLVITE) 1 MG tablet Take 1 mg by mouth once a week. Sunday.      . latanoprost (XALATAN) 0.005 % ophthalmic solution Place 1 drop into both eyes at bedtime.       Marland Kitchen losartan (COZAAR) 50 MG tablet Take 25 mg by mouth 2 (two) times daily.       . metoCLOPramide (REGLAN) 10 MG tablet Take 1 tablet (10 mg total) by mouth every 6 (six) hours as needed for nausea or vomiting.  15 tablet  0  . metoprolol (LOPRESSOR) 50 MG tablet Take 50 mg by mouth 2 (two) times daily.       . Multiple Vitamin (MULTIVITAMIN WITH MINERALS) TABS tablet Take 1 tablet by mouth daily.      . nitroGLYCERIN (NITROSTAT) 0.4 MG SL tablet Place 0.4 mg under the tongue every 5 (five) minutes x 3 doses as needed. For chest pain      . omeprazole (PRILOSEC) 40 MG capsule Take 40 mg by mouth daily.      . ondansetron (ZOFRAN) 8 MG tablet Take 8 mg by mouth every 8 (eight) hours as needed for nausea or vomiting.      . pravastatin (PRAVACHOL) 40 MG tablet Take 40 mg by mouth at bedtime.       Marland Kitchen  Probiotic Product (PROBIOTIC DAILY PO) Take 1 tablet by mouth daily.       . traMADol (ULTRAM) 50 MG tablet Take 1 tablet (50 mg total) by mouth every 6 (six) hours as needed.  15 tablet  0   No current facility-administered medications for this visit.    Past Medical History  Diagnosis Date  . Premature ventricular contraction   . Prostate cancer 2010  . 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 Radiation Feb 2011    treatment  . Biliary dyskinesia   . GERD (gastroesophageal reflux disease)   . Hiatal hernia 2010    tiny  . HTN (hypertension)   . Rosacea   . PTSD (post-traumatic stress disorder)   . Angina pectoris, unstable   . Diverticula of colon 2010  . Colon polyps     tubular adenomas  . Arthritis   . Exposure to [employer]   . CAD (coronary artery disease)     hx of 2 stents   . Peripheral neuropathy   . Anemia     hx of anemia as a child   . Colon cancer 12/2011    s/p  right hemicolectomy, did not tolerate chemo  . Iron deficiency 05/02/2012    followed by Dr. Tressie Pugh, iron infusions.   . Folic acid deficiency   . Heart palpitations 10/2012  . Gastroparesis 04/12/2013    borderline  . Myocardial infarction 3 yrs ago  . Glaucoma   . Abnormality of gait 12/18/2013    Past Surgical History  Procedure Laterality Date  . Cholecystectomy    . Prostatectomy    . Tonsillectomy    . Transurethral resection of prostate    . Esophagogastroduodenoscopy  10/10/2008    Dr. Tilda Pugh hiatal hernia, normal esophagus, normal stomach  . Colonoscopy  02/12/2004    Dr. Gala Pugh- L side diverticula, inflammatory  colon polyps  . Esophagogastroduodenoscopy  02/12/2004    Dr. Dudley Pugh- normal   . Coronary stent placement  01/2011    2.5x34m Xience drug-eluting stent in ramus intermedius/OMI vessel, 2.5x647mPromus Element stent in mid LAD artery  . 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 Multiple colonic  polyps removed as described above. Sigmoid Diverticulosis/ 1.5 x 2 cm relatively flat ulcerated lesion in cecum/path showed adenocarcinoma of the colon. descending colon polyp with tubular adenoma and one fragment of focal high grade dysplasia.   . Knee surgery      left knee   . Partial colectomy  02/29/2012    Procedure: PARTIAL COLECTOMY;  Surgeon: David HectorMD;  Location: [facility];  Service: General;  Laterality: Right;  . Vasectomy    . Colonoscopy  08/28/2012    RMVOZ:DGUYQIHKVroctitis. Colonic diverticulosis/Ulcerations at the surgical anastomosis  path: ulcerated colonic mucosa with prolapse changes, tubular adenoma.   . [facility]  [facility]  . Cataract extraction      2011  . Colonoscopy N/A 08/02/2013    Procedure: COLONOSCOPY;  Surgeon: RoDaneil DolinMD;  Location: [facility];  Service: Endoscopy;  Laterality: N/A;  1200  .  Cataract extraction w/phaco Right 08/06/2013    Procedure: CATARACT EXTRACTION PHACO AND INTRAOCULAR LENS PLACEMENT (IOC);  Surgeon: KeTonny BranchMD;  Location: [facility];  Service: Ophthalmology;  Laterality: Right;  CDE:20.96    Family History  Problem Relation Age of Onset  . Throat  cancer Father   . Cancer Father     Throat  . Prostate cancer Brother   . Cancer Brother     Prostate and Skin  . Neuropathy Brother   . Colon cancer Neg Hx     History   Social History  . Marital Status: Married    Spouse Name: N/A    Number of Children: 3  . 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     Social History Main Topics  . Smoking status: Never Smoker   . Smokeless tobacco: Never Used  . Alcohol Use: Yes     Comment: 1 cocktail/month  . Drug Use: No  . Sexual Activity: Yes    Birth Control/ Protection: None   Other Topics Concern  . Not on file   Social History Narrative  . No narrative on file    Review of systems: The patient specifically denies any chest pain at rest or with exertion, dyspnea at rest or with exertion, orthopnea, paroxysmal nocturnal dyspnea, syncope, palpitations, focal neurological deficits, intermittent claudication, lower extremity edema, unexplained weight gain, cough, hemoptysis or wheezing.  The patient also denies abdominal pain, nausea, vomiting, dysphagia, diarrhea, constipation, polyuria, polydipsia, dysuria, hematuria, frequency, urgency, abnormal bleeding or bruising, fever, chills, unexpected weight changes, mood swings, change in skin or hair texture, change in voice quality, auditory or visual problems, allergic reactions or rashes, new musculoskeletal complaints other than usual "aches and pains".   PHYSICAL EXAM BP 120/60  Pulse 70  Resp 16  Ht _0  (1.778 m)  Wt 212 lb (96.163 kg)  BMI 30.42 kg/m2  General: Alert, oriented x3, no distress Head: no evidence of trauma, PERRL, EOMI, no exophtalmos or lid lag,  no myxedema, no xanthelasma; normal ears, nose and oropharynx Neck: normal jugular venous pulsations and no hepatojugular reflux; brisk carotid pulses without delay and no carotid bruits Chest: clear to auscultation, no signs of consolidation by percussion or palpation, normal fremitus, symmetrical and full respiratory excursions; residual dusky red rash over the anterior fifth intercostal space consistent with herpes zoster Cardiovascular: normal position and quality of the apical impulse, regular rhythm, normal first and second heart sounds, no murmurs, rubs or gallops Abdomen: no tenderness or distention, no masses by palpation, no abnormal pulsatility or arterial bruits, normal bowel sounds, no hepatosplenomegaly Extremities: no clubbing, cyanosis or edema; 2+ radial, ulnar and brachial pulses bilaterally; 2+ right femoral, posterior tibial and dorsalis pedis pulses; 2+ left femoral, posterior tibial and dorsalis pedis pulses; no subclavian or femoral bruits Neurological: grossly nonfocal   EKG: Normal sinus rhythm, normal tracing  Lipid Panel     Component Value Date/Time   CHOL 120 10/15/2012 0630   TRIG 104 10/15/2012 0630   HDL 51 10/15/2012 0630   CHOLHDL 2.4 10/15/2012 0630   VLDL 21 10/15/2012 0630   LDLCALC 48 10/15/2012 0630    BMET    Component Value Date/Time   NA 141 01/08/2014 2230   K 4.0 01/08/2014 2230   CL 104 01/08/2014 2230   CO2 22 01/08/2014 2230   GLUCOSE 139* 01/08/2014 2230   BUN 16 01/08/2014 2230   CREATININE 1.16 01/08/2014 2230   CALCIUM 9.1 01/08/2014 2230   GFRNONAA 59* 01/08/2014 2230   GFRAA 68* 01/08/2014 2230     ASSESSMENT AND PLAN CAD (coronary artery disease) Asymptomatic roughly 3 years since placement of drug-eluting stents to the LAD and OM/ramus intermedius. Risk factors are generally well controlled  with the exception of his weight. He had a normal nuclear perfusion study in February 2014.  Dyslipidemia Roughly one year has passed since we last  checked his lipid profile and will reassess.  Shingles outbreak, right anterior thoracic     Orders Placed This Encounter  Procedures  . Comp Met (CMET)  . Lipid Profile  . EKG 12-Lead    Veyda Kaufman  Sanda Klein, MD, [facility] [phone] office [phone] pager

## 2014-01-28 NOTE — Patient Instructions (Signed)
1.Your physician recommends that you schedule a follow-up appointment in:   One year with Dr. Sallyanne Kuster  2. Have your labs done Fasting

## 2014-01-28 NOTE — Assessment & Plan Note (Signed)
Roughly one year has passed since we last checked his lipid profile and will reassess.

## 2014-01-28 NOTE — Assessment & Plan Note (Addendum)
Asymptomatic roughly 3 years since placement of drug-eluting stents to the LAD and OM/ramus intermedius. Risk factors are generally well controlled with the exception of his weight. He had a normal nuclear perfusion study in February 2014.

## 2014-02-06 ENCOUNTER — Other Ambulatory Visit: Payer: Self-pay | Admitting: Cardiovascular Disease

## 2014-02-06 NOTE — Telephone Encounter (Signed)
Rx was sent to pharmacy electronically. 

## 2014-02-26 ENCOUNTER — Encounter (HOSPITAL_COMMUNITY): Payer: Medicare Other | Attending: Oncology

## 2014-02-26 DIAGNOSIS — C182 Malignant neoplasm of ascending colon: Secondary | ICD-10-CM | POA: Insufficient documentation

## 2014-02-26 DIAGNOSIS — E611 Iron deficiency: Secondary | ICD-10-CM

## 2014-02-26 DIAGNOSIS — D509 Iron deficiency anemia, unspecified: Secondary | ICD-10-CM | POA: Insufficient documentation

## 2014-02-26 LAB — FERRITIN: Ferritin: 72 ng/mL (ref 22–322)

## 2014-02-26 NOTE — Progress Notes (Signed)
LABS DRAWN FOR FERR, CEA

## 2014-02-27 LAB — CEA: CEA: 1.3 ng/mL (ref 0.0–5.0)

## 2014-05-21 ENCOUNTER — Encounter (HOSPITAL_COMMUNITY): Payer: Medicare Other | Attending: Hematology and Oncology

## 2014-05-21 ENCOUNTER — Encounter (HOSPITAL_COMMUNITY): Payer: Self-pay

## 2014-05-21 ENCOUNTER — Encounter (HOSPITAL_BASED_OUTPATIENT_CLINIC_OR_DEPARTMENT_OTHER): Payer: Medicare Other

## 2014-05-21 VITALS — BP 156/84 | HR 71 | Temp 98.2°F | Resp 16 | Wt 222.6 lb

## 2014-05-21 DIAGNOSIS — Z7982 Long term (current) use of aspirin: Secondary | ICD-10-CM | POA: Insufficient documentation

## 2014-05-21 DIAGNOSIS — K219 Gastro-esophageal reflux disease without esophagitis: Secondary | ICD-10-CM | POA: Diagnosis not present

## 2014-05-21 DIAGNOSIS — D509 Iron deficiency anemia, unspecified: Secondary | ICD-10-CM | POA: Diagnosis not present

## 2014-05-21 DIAGNOSIS — C61 Malignant neoplasm of prostate: Secondary | ICD-10-CM

## 2014-05-21 DIAGNOSIS — F430 Acute stress reaction: Secondary | ICD-10-CM | POA: Insufficient documentation

## 2014-05-21 DIAGNOSIS — H409 Unspecified glaucoma: Secondary | ICD-10-CM | POA: Insufficient documentation

## 2014-05-21 DIAGNOSIS — E538 Deficiency of other specified B group vitamins: Secondary | ICD-10-CM | POA: Diagnosis not present

## 2014-05-21 DIAGNOSIS — Z85038 Personal history of other malignant neoplasm of large intestine: Secondary | ICD-10-CM | POA: Diagnosis present

## 2014-05-21 DIAGNOSIS — Z9089 Acquired absence of other organs: Secondary | ICD-10-CM | POA: Diagnosis not present

## 2014-05-21 DIAGNOSIS — I2510 Atherosclerotic heart disease of native coronary artery without angina pectoris: Secondary | ICD-10-CM | POA: Diagnosis not present

## 2014-05-21 DIAGNOSIS — Z9079 Acquired absence of other genital organ(s): Secondary | ICD-10-CM | POA: Diagnosis not present

## 2014-05-21 DIAGNOSIS — I10 Essential (primary) hypertension: Secondary | ICD-10-CM | POA: Insufficient documentation

## 2014-05-21 DIAGNOSIS — Z79899 Other long term (current) drug therapy: Secondary | ICD-10-CM | POA: Insufficient documentation

## 2014-05-21 DIAGNOSIS — Z923 Personal history of irradiation: Secondary | ICD-10-CM | POA: Diagnosis not present

## 2014-05-21 DIAGNOSIS — Z8546 Personal history of malignant neoplasm of prostate: Secondary | ICD-10-CM | POA: Diagnosis not present

## 2014-05-21 DIAGNOSIS — Z8042 Family history of malignant neoplasm of prostate: Secondary | ICD-10-CM | POA: Diagnosis not present

## 2014-05-21 DIAGNOSIS — Z9861 Coronary angioplasty status: Secondary | ICD-10-CM | POA: Diagnosis not present

## 2014-05-21 DIAGNOSIS — D529 Folate deficiency anemia, unspecified: Secondary | ICD-10-CM

## 2014-05-21 DIAGNOSIS — Z9049 Acquired absence of other specified parts of digestive tract: Secondary | ICD-10-CM | POA: Diagnosis not present

## 2014-05-21 DIAGNOSIS — E785 Hyperlipidemia, unspecified: Secondary | ICD-10-CM | POA: Insufficient documentation

## 2014-05-21 DIAGNOSIS — G609 Hereditary and idiopathic neuropathy, unspecified: Secondary | ICD-10-CM | POA: Diagnosis not present

## 2014-05-21 DIAGNOSIS — Z853 Personal history of malignant neoplasm of breast: Secondary | ICD-10-CM

## 2014-05-21 DIAGNOSIS — C182 Malignant neoplasm of ascending colon: Secondary | ICD-10-CM

## 2014-05-21 DIAGNOSIS — Z23 Encounter for immunization: Secondary | ICD-10-CM | POA: Insufficient documentation

## 2014-05-21 DIAGNOSIS — E611 Iron deficiency: Secondary | ICD-10-CM

## 2014-05-21 LAB — CBC WITH DIFFERENTIAL/PLATELET
BASOS PCT: 1 % (ref 0–1)
Basophils Absolute: 0.1 10*3/uL (ref 0.0–0.1)
EOS ABS: 0.8 10*3/uL — AB (ref 0.0–0.7)
Eosinophils Relative: 7 % — ABNORMAL HIGH (ref 0–5)
HCT: 45 % (ref 39.0–52.0)
HEMOGLOBIN: 15.3 g/dL (ref 13.0–17.0)
LYMPHS ABS: 4.4 10*3/uL — AB (ref 0.7–4.0)
Lymphocytes Relative: 37 % (ref 12–46)
MCH: 31.6 pg (ref 26.0–34.0)
MCHC: 34 g/dL (ref 30.0–36.0)
MCV: 93 fL (ref 78.0–100.0)
Monocytes Absolute: 0.8 10*3/uL (ref 0.1–1.0)
Monocytes Relative: 7 % (ref 3–12)
Neutro Abs: 5.7 10*3/uL (ref 1.7–7.7)
Neutrophils Relative %: 48 % (ref 43–77)
Platelets: 332 10*3/uL (ref 150–400)
RBC: 4.84 MIL/uL (ref 4.22–5.81)
RDW: 12.6 % (ref 11.5–15.5)
WBC: 11.8 10*3/uL — ABNORMAL HIGH (ref 4.0–10.5)

## 2014-05-21 LAB — COMPREHENSIVE METABOLIC PANEL
ALT: 16 U/L (ref 0–53)
ANION GAP: 10 (ref 5–15)
AST: 23 U/L (ref 0–37)
Albumin: 3.7 g/dL (ref 3.5–5.2)
Alkaline Phosphatase: 64 U/L (ref 39–117)
BUN: 15 mg/dL (ref 6–23)
CO2: 27 meq/L (ref 19–32)
Calcium: 9.3 mg/dL (ref 8.4–10.5)
Chloride: 106 mEq/L (ref 96–112)
Creatinine, Ser: 1.19 mg/dL (ref 0.50–1.35)
GFR, EST AFRICAN AMERICAN: 66 mL/min — AB (ref >90)
GFR, EST NON AFRICAN AMERICAN: 57 mL/min — AB (ref >90)
Glucose, Bld: 105 mg/dL — ABNORMAL HIGH (ref 70–99)
Potassium: 4.7 mEq/L (ref 3.7–5.3)
SODIUM: 143 meq/L (ref 137–147)
Total Bilirubin: 0.7 mg/dL (ref 0.3–1.2)
Total Protein: 7.1 g/dL (ref 6.0–8.3)

## 2014-05-21 LAB — CEA: CEA: 1.1 ng/mL (ref 0.0–5.0)

## 2014-05-21 MED ORDER — PNEUMOCOCCAL VAC POLYVALENT 25 MCG/0.5ML IJ INJ
0.5000 mL | INJECTION | INTRAMUSCULAR | Status: DC
  Filled 2014-05-21: qty 0.5

## 2014-05-21 MED ORDER — INFLUENZA VAC SPLIT QUAD 0.5 ML IM SUSY
0.5000 mL | PREFILLED_SYRINGE | Freq: Once | INTRAMUSCULAR | Status: AC
  Administered 2014-05-21: 0.5 mL via INTRAMUSCULAR

## 2014-05-21 MED ORDER — INFLUENZA VAC SPLIT QUAD 0.5 ML IM SUSY
PREFILLED_SYRINGE | INTRAMUSCULAR | Status: AC
  Filled 2014-05-21: qty 0.5

## 2014-05-21 MED ORDER — PNEUMOCOCCAL VAC POLYVALENT 25 MCG/0.5ML IJ INJ
0.5000 mL | INJECTION | Freq: Once | INTRAMUSCULAR | Status: AC
  Administered 2014-05-21: 0.5 mL via INTRAMUSCULAR
  Filled 2014-05-21: qty 0.5

## 2014-05-21 NOTE — Progress Notes (Signed)
[facility]  OFFICE PROGRESS NOTE  [name],[name], MD [address] [place] [address]  DIAGNOSIS: Cancer of ascending colon - Plan: Ferritin, Influenza vac split quadrivalent PF (FLUARIX) injection 0.5 mL, pneumococcal 23 valent vaccine (PNU-IMMUNE) injection 0.5 mL, CT Abdomen Pelvis W Contrast, CBC with Differential, Comprehensive metabolic panel, CEA, Ferritin  Folic acid deficiency anemia - Plan: Ferritin, Influenza vac split quadrivalent PF (FLUARIX) injection 0.5 mL, pneumococcal 23 valent vaccine (PNU-IMMUNE) injection 0.5 mL, CT Abdomen Pelvis W Contrast, CBC with Differential, Comprehensive metabolic panel, CEA, Ferritin  Iron deficiency - Plan: Ferritin, Influenza vac split quadrivalent PF (FLUARIX) injection 0.5 mL, pneumococcal 23 valent vaccine (PNU-IMMUNE) injection 0.5 mL, CT Abdomen Pelvis W Contrast, CBC with Differential, Comprehensive metabolic panel, CEA, Ferritin  Prostate cancer - Plan: PSA  Chief Complaint  Patient presents with  . Colon Cancer    CURRENT THERAPY: Watchful expectation.  INTERVAL HISTORY: David Pugh 79 y.o. male returns for followup of stage III,, status post right hemicolectomy on 02/29/2012 with inability to tolerate Xeloda adjuvant treatment. 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  He did undergo colonoscopy in December 2014 was told he need not repeat the study for 3 years. Last CT scan of chest abdomen and pelvis done on 12/03/2013 showed no evidence of disease. He continues to do well with excellent appetite. Bowel movements are regular with no hematochezia, melena, hematemesis, abdominal distention, or abdominal pain. He denies any difficulty in initiating urination, incontinence, has one episode of nocturia nightly. He denies any fever, night sweats, cough, wheezing, sore throat, lower  extremity swelling or redness, chest pain, PND, orthopnea, or palpitations.    MEDICAL HISTORY: Past Medical History  Diagnosis Date  . Premature ventricular contraction   . Prostate cancer 2010  . 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 Radiation Feb 2011    treatment  . Biliary dyskinesia   . GERD (gastroesophageal reflux disease)   . Hiatal hernia 2010    tiny  . HTN (hypertension)   . Rosacea   . PTSD (post-traumatic stress disorder)   . Angina pectoris, unstable   . Diverticula of colon 2010  . Colon polyps     tubular adenomas  . Arthritis   . Exposure to [employer]   . CAD (coronary artery disease)     hx of 2 stents   . Peripheral neuropathy   . Anemia     hx of anemia as a child   . Colon cancer 12/2011    s/p right hemicolectomy, did not tolerate chemo  . Iron deficiency 05/02/2012    followed by Dr. Tressie [name], iron infusions.   . Folic acid deficiency   . Heart palpitations 10/2012  . Gastroparesis 04/12/2013    borderline  . Myocardial infarction 3 yrs ago  . Glaucoma   . Abnormality of gait 12/18/2013    INTERIM HISTORY: has PREMATURE VENTRICULAR CONTRACTIONS; GERD; Cancer of ascending colon; Iron deficiency; Hx of adenomatous colonic polyps; Folic acid deficiency; Peripheral neuropathy; Chest pain - ruled out for MI; negative Nuclear Stress Test 10/17/12; CAD (coronary artery disease); HTN (hypertension); Dyslipidemia; Nausea vomiting and diarrhea; Dehydration; Viral gastroenteritis; Abnormality of gait; and Shingles outbreak, right anterior thoracic on his problem list.    ALLERGIES:  is allergic to shellfish allergy; sulfa antibiotics; and  sulfonamide derivatives.  MEDICATIONS: has a current medication list which includes the following prescription(s): acetaminophen, alprazolam, aspirin ec, b complex vitamins, cetirizine, citalopram, folic acid, latanoprost, losartan, metoclopramide, metoprolol, multivitamin with minerals, nitroglycerin, omeprazole,  ondansetron, pravastatin, pravastatin, probiotic product, and tramadol.  SURGICAL HISTORY:  Past Surgical History  Procedure Laterality Date  . Cholecystectomy    . Prostatectomy    . Tonsillectomy    . Transurethral resection of prostate    . Esophagogastroduodenoscopy  10/10/2008    Dr. Tilda Burrow hiatal hernia, normal esophagus, normal stomach  . Colonoscopy  02/12/2004    Dr. Gala Romney- L side diverticula, inflammatory  colon polyps  . Esophagogastroduodenoscopy  02/12/2004    Dr. Dudley Major- normal   . Coronary stent placement  01/2011    2.5x2m Xience drug-eluting stent in ramus intermedius/OMI vessel, 2.5x658mPromus Element stent in mid LAD artery  . 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 Multiple colonic  polyps removed as described above. Sigmoid Diverticulosis/ 1.5 x 2 cm relatively flat ulcerated lesion in cecum/path showed adenocarcinoma of the colon. descending colon polyp with tubular adenoma and one fragment of focal high grade dysplasia.   . Knee surgery      left knee   . Partial colectomy  02/29/2012    Procedure: PARTIAL COLECTOMY;  Surgeon: HaAdin HectorMD;  Location: [facility];  Service: General;  Laterality: Right;  . Vasectomy    . Colonoscopy  08/28/2012    RMZOX:WRUEAVWUJroctitis. Colonic diverticulosis/Ulcerations at the surgical anastomosis  path: ulcerated colonic mucosa with prolapse changes, tubular adenoma.   . [facility]  . Cataract extraction      2011  . Colonoscopy N/A 08/02/2013    Procedure: COLONOSCOPY;  Surgeon: RoDaneil DolinMD;  Location: [facility];  Service: Endoscopy;  Laterality: N/A;  1200  . Cataract extraction w/phaco Right 08/06/2013    Procedure: CATARACT EXTRACTION PHACO AND INTRAOCULAR LENS PLACEMENT (IOC);  Surgeon: KeTonny BranchMD;  Location: [facility];  Service: Ophthalmology;  Laterality: Right;   CDE:20.96    FAMILY HISTORY: family history includes Cancer in his brother and father; Neuropathy in his brother; Prostate cancer in his brother; Throat cancer in his father. There is no history of Colon cancer.  SOCIAL HISTORY:  reports that he has never smoked. He has never used smokeless tobacco. He reports that he drinks alcohol. He reports that he does not use illicit drugs.  REVIEW OF SYSTEMS:  Other than that discussed above is noncontributory.  PHYSICAL EXAMINATION: ECOG PERFORMANCE STATUS: 1 - Symptomatic but completely ambulatory  Blood pressure 156/84, pulse 71, temperature 98.2 F (36.8 C), temperature source Oral, resp. rate 16, weight 222 lb 9.6 oz (100.971 kg), SpO2 97.00%.  GENERAL:alert, no distress and comfortable SKIN: skin color, texture, turgor are normal, no rashes or significant lesions EYES: PERLA; Conjunctiva are pink and non-injected, sclera clear SINUSES: No redness or tenderness over maxillary or ethmoid sinuses OROPHARYNX:no exudate, no erythema on lips, buccal mucosa, or tongue. NECK: supple, thyroid normal size, non-tender, without nodularity. No masses CHEST: Normal AP diameter with no breast masses. LYMPH:  no palpable lymphadenopathy in the cervical, axillary or inguinal LUNGS: clear to auscultation and percussion with normal breathing effort HEART: regular rate & rhythm and no murmurs. ABDOMEN:abdomen soft, non-tender and normal bowel sounds. Liver spleen on large. No free fluid wave or shifting dullness. MUSCULOSKELETAL:no cyanosis of digits and no  clubbing. Range of motion normal.  NEURO: alert & oriented x 3 with fluent speech, no focal motor/sensory deficits   LABORATORY DATA: Lab on 05/21/2014  Component Date Value Ref Range Status  . WBC 05/21/2014 11.8* 4.0 - 10.5 K/uL Final  . RBC 05/21/2014 4.84  4.22 - 5.81 MIL/uL Final  . Hemoglobin 05/21/2014 15.3  13.0 - 17.0 g/dL Final  . HCT 05/21/2014 45.0  39.0 - 52.0 % Final  . MCV 05/21/2014  93.0  78.0 - 100.0 fL Final  . MCH 05/21/2014 31.6  26.0 - 34.0 pg Final  . MCHC 05/21/2014 34.0  30.0 - 36.0 g/dL Final  . RDW 05/21/2014 12.6  11.5 - 15.5 % Final  . Platelets 05/21/2014 332  150 - 400 K/uL Final  . Neutrophils Relative % 05/21/2014 48  43 - 77 % Final  . Lymphocytes Relative 05/21/2014 37  12 - 46 % Final  . Monocytes Relative 05/21/2014 7  3 - 12 % Final  . Eosinophils Relative 05/21/2014 7* 0 - 5 % Final  . Basophils Relative 05/21/2014 1  0 - 1 % Final  . Neutro Abs 05/21/2014 5.7  1.7 - 7.7 K/uL Final  . Lymphs Abs 05/21/2014 4.4* 0.7 - 4.0 K/uL Final  . Monocytes Absolute 05/21/2014 0.8  0.1 - 1.0 K/uL Final  . Eosinophils Absolute 05/21/2014 0.8* 0.0 - 0.7 K/uL Final  . Basophils Absolute 05/21/2014 0.1  0.0 - 0.1 K/uL Final  . WBC Morphology 05/21/2014 ATYPICAL LYMPHOCYTES   Final  . Smear Review 05/21/2014 LARGE PLATELETS PRESENT   Final  . Sodium 05/21/2014 143  137 - 147 mEq/L Final  . Potassium 05/21/2014 4.7  3.7 - 5.3 mEq/L Final  . Chloride 05/21/2014 106  96 - 112 mEq/L Final  . CO2 05/21/2014 27  19 - 32 mEq/L Final  . Glucose, Bld 05/21/2014 105* 70 - 99 mg/dL Final  . BUN 05/21/2014 15  6 - 23 mg/dL Final  . Creatinine, Ser 05/21/2014 1.19  0.50 - 1.35 mg/dL Final  . Calcium 05/21/2014 9.3  8.4 - 10.5 mg/dL Final  . Total Protein 05/21/2014 7.1  6.0 - 8.3 g/dL Final  . Albumin 05/21/2014 3.7  3.5 - 5.2 g/dL Final  . AST 05/21/2014 23  0 - 37 U/L Final  . ALT 05/21/2014 16  0 - 53 U/L Final  . Alkaline Phosphatase 05/21/2014 64  39 - 117 U/L Final  . Total Bilirubin 05/21/2014 0.7  0.3 - 1.2 mg/dL Final  . GFR calc non Af Amer 05/21/2014 57* >90 mL/min Final  . GFR calc Af Amer 05/21/2014 66* >90 mL/min Final   Comment: (NOTE)                          The eGFR has been calculated using the CKD EPI equation.                          This calculation has not been validated in all clinical situations.                          eGFR's persistently  <90 mL/min signify possible Chronic Kidney                          Disease.  . Anion gap 05/21/2014 10  5 - 15 Final  PATHOLOGY: No new pathology.  Urinalysis    Component Value Date/Time   COLORURINE ORANGE* 11/24/2013 1730   APPEARANCEUR CLOUDY* 11/24/2013 1730   LABSPEC >1.030* 11/24/2013 1730   PHURINE 5.5 11/24/2013 1730   GLUCOSEU NEGATIVE 11/24/2013 1730   HGBUR NEGATIVE 11/24/2013 1730   BILIRUBINUR NEGATIVE 11/24/2013 1730   KETONESUR TRACE* 11/24/2013 1730   PROTEINUR NEGATIVE 11/24/2013 1730   UROBILINOGEN 0.2 11/24/2013 1730   NITRITE NEGATIVE 11/24/2013 1730   LEUKOCYTESUR NEGATIVE 11/24/2013 1730    RADIOGRAPHIC STUDIES: No results found.  ASSESSMENT:  #1. Stage III carcinoma of the ascending colon, status post resection on 03/02/2012, poor tolerance of adjuvant Xeloda. Postoperative treatment therefore was not given.  #2. Peripheral neuropathy, stable.  #3. Iron deficiency anemia, status post intravenous Feraheme in August and November 2013, stable  #4. Folic acid deficiency, on supplements #5. History of prostate cancer, no evidence of disease.    PLAN:  #1. Continue folic acid 1 mg daily. #2. Influenza virus vaccine plus Pneumovax. #3. CT chest abdomen and pelvis in October 2015 to compare to April 2015. #4. Followup in 3 months with CBC, chem profile, CEA, PSA, and ferritin.   All questions were answered. The patient knows to call the clinic with any problems, questions or concerns. We can certainly see the patient much sooner if necessary.   I spent 25 minutes counseling the patient face to face. The total time spent in the appointment was 30 minutes.    Doroteo Bradford, MD 05/21/2014 9:45 AM  DISCLAIMER:  This note was dictated with voice recognition software.  Similar sounding words can inadvertently be transcribed inaccurately and may not be corrected upon review.

## 2014-05-21 NOTE — Patient Instructions (Signed)
[facility] Discharge Instructions  RECOMMENDATIONS MADE BY THE CONSULTANT AND ANY TEST RESULTS WILL BE SENT TO YOUR REFERRING PHYSICIAN.  EXAM FINDINGS BY THE PHYSICIAN TODAY AND SIGNS OR SYMPTOMS TO REPORT TO CLINIC OR PRIMARY PHYSICIAN: You saw Dr Barnet Glasgow today    INSTRUCTIONS GIVEN AND DISCUSSED: CT scan in October, Follow up in 3 months with the doctor and lab work.  SPECIAL INSTRUCTIONS/FOLLOW-UP: You received flu and pnemonia vaccine today   Thank you for choosing [facility] to provide your oncology and hematology care.  To afford each patient quality time with our providers, please arrive at least 15 minutes before your scheduled appointment time.  With your help, our goal is to use those 15 minutes to complete the necessary work-up to ensure our physicians have the information they need to help with your evaluation and healthcare recommendations.    Effective January 1st, 2014, we ask that you re-schedule your appointment with our physicians should you arrive 10 or more minutes late for your appointment.  We strive to give you quality time with our providers, and arriving late affects you and other patients whose appointments are after yours.    Again, thank you for choosing [facility].  Our hope is that these requests will decrease the amount of time that you wait before being seen by our physicians.       _____________________________________________________________  Should you have questions after your visit to [facility], please contact our office at [phone] between the hours of 8:30 a.m. and 5:00 p.m.  Voicemails left after 4:30 p.m. will not be returned until the following business day.  For prescription refill requests, have your pharmacy contact our office with your prescription refill request.

## 2014-05-21 NOTE — Progress Notes (Signed)
Saksham A Musso's reason for visit today is for an injectionGeorge A Baack also received flurarix and pnemonia vaccines per MD orders; see MAR for administration details.  Ardis Hughs tolerated all procedures well and without incident; questions were answered and patient was discharged.

## 2014-05-21 NOTE — Progress Notes (Signed)
LABS FOR CEA,CBCD,CMP

## 2014-05-23 ENCOUNTER — Encounter: Payer: Self-pay | Admitting: Orthopedic Surgery

## 2014-05-23 ENCOUNTER — Ambulatory Visit (INDEPENDENT_AMBULATORY_CARE_PROVIDER_SITE_OTHER): Payer: Medicare Other

## 2014-05-23 ENCOUNTER — Ambulatory Visit (INDEPENDENT_AMBULATORY_CARE_PROVIDER_SITE_OTHER): Payer: Medicare Other | Admitting: Orthopedic Surgery

## 2014-05-23 VITALS — BP 124/82 | Ht 70.0 in | Wt 222.6 lb

## 2014-05-23 DIAGNOSIS — M25562 Pain in left knee: Secondary | ICD-10-CM

## 2014-05-23 DIAGNOSIS — M25569 Pain in unspecified knee: Secondary | ICD-10-CM

## 2014-05-23 DIAGNOSIS — M629 Disorder of muscle, unspecified: Secondary | ICD-10-CM

## 2014-05-23 DIAGNOSIS — M2420 Disorder of ligament, unspecified site: Secondary | ICD-10-CM

## 2014-05-23 DIAGNOSIS — M238X2 Other internal derangements of left knee: Secondary | ICD-10-CM

## 2014-05-23 DIAGNOSIS — M48062 Spinal stenosis, lumbar region with neurogenic claudication: Secondary | ICD-10-CM

## 2014-05-23 NOTE — Patient Instructions (Signed)
Will advise Dr Scotty Court of findings

## 2014-05-23 NOTE — Progress Notes (Signed)
Chief Complaint  Patient presents with  . Knee Pain    Left knee pain and giving out, no known injury, ref. D [name]    79 year old male referred to [place] by Dr. Scotty Court regular doctor same. [facility] is a pharmacy.  Patient says my left knee fails when I am walking and pain in both knees x1 year. 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 His main problem is instability of the left knee but he says he has trouble walking or standing for any long periods of time. He sees a Restaurant manager, fast food for back pain and that has worked out well. He does not have any locking he says the leg give out at any point. His pain is rated 5/10. His previous treatment includes topical creams Tylenol and he says that activities of life make his leg and knees worse. He has not had any physical therapy he has had diclofenac anti-inflammatory medication he uses a cane he has not worn any braces he is not had any specific weight loss intervention but is not overweight his analgesic includes tramadol he has not had injection. He also notes a history of peripheral neuropathy and lack of feeling in his feet and legs from the knee down  Review of Systems  HENT: Positive for congestion and hearing loss.   Respiratory: Positive for cough.   Musculoskeletal: Positive for back pain, falls, joint pain and myalgias.  Neurological:       Numbness from peripheral neuropathy  Endo/Heme/Allergies: Positive for environmental allergies.  All other systems reviewed and are negative.   Past Medical History  Diagnosis Date  . Premature ventricular contraction   . Prostate cancer 2010  . 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 Radiation Feb 2011    treatment  . Biliary dyskinesia   . GERD (gastroesophageal reflux disease)   . Hiatal hernia 2010    tiny  . HTN (hypertension)   . Rosacea   . PTSD (post-traumatic stress  disorder)   . Angina pectoris, unstable   . Diverticula of colon 2010  . Colon polyps     tubular adenomas  . Arthritis   . Exposure to [employer]   . CAD (coronary artery disease)     hx of 2 stents   . Peripheral neuropathy   . Anemia     hx of anemia as a child   . Colon cancer 12/2011    s/p right hemicolectomy, did not tolerate chemo  . Iron deficiency 05/02/2012    followed by Dr. Tressie [name], iron infusions.   . Folic acid deficiency   . Heart palpitations 10/2012  . Gastroparesis 04/12/2013    borderline  . Myocardial infarction 3 yrs ago  . Glaucoma   . Abnormality of gait 12/18/2013   Medical history of coronary artery disease and unstable angina x2 stents he has an ejection fraction of 58% as of 2013. Sulci colon cancer right hemicolectomy and radiation therapy. Outside reports reviewed: historical medical records.  The following portions of the patient's history were reviewed and updated as appropriate: allergies, current medications, past family history, past medical history, past social history, past surgical history and problem list.    Objective:      General :   alert, cooperative and no distress  Gait:  his gait shows a flexed posture of  spinal stenosis he has decreased stride length bilaterally.  . The patient can bear weight on the right and left leg    BP 124/82  Ht 5\' 10"  (1.778 m)  Wt 222 lb 9.6 oz (100.971 kg)  BMI 31.94 kg/m2 Mood and affect are normal.  Right knee evaluation the patient has no tenderness or swelling in the right knee and the range of motion is essentially full. All ligaments are stable there may be some trace positive posterior laxity in the PCL but it is minor and mild strength shows weakness in his proximal hip flexors skin is normal pulses are good temperature is normal regarding his right leg  Left leg shows a 2+ posterior posterior laxity PCL. No tenderness or effusion remaining ligament stable skin normal pulse and temperature  normal, proximal weakness in the hip flexors Imaging X-rays: 3 views of the knee demonstrate joint space narrowing.    Assessment:  It is apparent to me that the patient has spinal stenosis and weakness and giving way symptoms related to peripheral neuropathy and spinal stenosis. He does have some mild PCL laxity which is chronic and is associated with some mild degenerative arthritis in the left knee.   Plan:     I recommendation would be that he be sent for neurosurgical evaluation. Follow up: No followup x.

## 2014-06-11 ENCOUNTER — Ambulatory Visit (HOSPITAL_COMMUNITY)
Admission: RE | Admit: 2014-06-11 | Discharge: 2014-06-11 | Disposition: A | Discharge status: Home / Self Care | Payer: Medicare Other | Source: Ambulatory Visit | Attending: Hematology and Oncology | Admitting: Hematology and Oncology

## 2014-06-11 DIAGNOSIS — D529 Folate deficiency anemia, unspecified: Secondary | ICD-10-CM | POA: Insufficient documentation

## 2014-06-11 DIAGNOSIS — Z85038 Personal history of other malignant neoplasm of large intestine: Secondary | ICD-10-CM | POA: Diagnosis not present

## 2014-06-11 DIAGNOSIS — E611 Iron deficiency: Secondary | ICD-10-CM

## 2014-06-11 DIAGNOSIS — K429 Umbilical hernia without obstruction or gangrene: Secondary | ICD-10-CM | POA: Insufficient documentation

## 2014-06-11 DIAGNOSIS — Z9049 Acquired absence of other specified parts of digestive tract: Secondary | ICD-10-CM | POA: Insufficient documentation

## 2014-06-11 DIAGNOSIS — C182 Malignant neoplasm of ascending colon: Secondary | ICD-10-CM

## 2014-06-11 DIAGNOSIS — I2510 Atherosclerotic heart disease of native coronary artery without angina pectoris: Secondary | ICD-10-CM | POA: Diagnosis not present

## 2014-06-11 MED ORDER — IOHEXOL 300 MG/ML  SOLN
100.0000 mL | Freq: Once | INTRAMUSCULAR | Status: AC | PRN
  Administered 2014-06-11: 100 mL via INTRAVENOUS

## 2014-06-16 ENCOUNTER — Inpatient Hospital Stay (HOSPITAL_COMMUNITY)
Admission: EM | Admit: 2014-06-16 | Discharge: 2014-06-18 | DRG: 251 | Disposition: A | Discharge status: Home / Self Care | Payer: Medicare Other | Attending: Cardiology | Admitting: Cardiology

## 2014-06-16 ENCOUNTER — Encounter (HOSPITAL_COMMUNITY): Payer: Self-pay | Admitting: Emergency Medicine

## 2014-06-16 ENCOUNTER — Emergency Department (HOSPITAL_COMMUNITY): Payer: Medicare Other

## 2014-06-16 DIAGNOSIS — I493 Ventricular premature depolarization: Secondary | ICD-10-CM | POA: Diagnosis present

## 2014-06-16 DIAGNOSIS — E785 Hyperlipidemia, unspecified: Secondary | ICD-10-CM

## 2014-06-16 DIAGNOSIS — K219 Gastro-esophageal reflux disease without esophagitis: Secondary | ICD-10-CM | POA: Diagnosis present

## 2014-06-16 DIAGNOSIS — G629 Polyneuropathy, unspecified: Secondary | ICD-10-CM | POA: Diagnosis present

## 2014-06-16 DIAGNOSIS — Z808 Family history of malignant neoplasm of other organs or systems: Secondary | ICD-10-CM | POA: Diagnosis not present

## 2014-06-16 DIAGNOSIS — I252 Old myocardial infarction: Secondary | ICD-10-CM

## 2014-06-16 DIAGNOSIS — Z923 Personal history of irradiation: Secondary | ICD-10-CM

## 2014-06-16 DIAGNOSIS — Z8042 Family history of malignant neoplasm of prostate: Secondary | ICD-10-CM

## 2014-06-16 DIAGNOSIS — Z882 Allergy status to sulfonamides status: Secondary | ICD-10-CM | POA: Diagnosis not present

## 2014-06-16 DIAGNOSIS — I10 Essential (primary) hypertension: Secondary | ICD-10-CM | POA: Diagnosis present

## 2014-06-16 DIAGNOSIS — H409 Unspecified glaucoma: Secondary | ICD-10-CM | POA: Diagnosis present

## 2014-06-16 DIAGNOSIS — Z7982 Long term (current) use of aspirin: Secondary | ICD-10-CM

## 2014-06-16 DIAGNOSIS — I2510 Atherosclerotic heart disease of native coronary artery without angina pectoris: Secondary | ICD-10-CM | POA: Diagnosis present

## 2014-06-16 DIAGNOSIS — Z955 Presence of coronary angioplasty implant and graft: Secondary | ICD-10-CM | POA: Diagnosis not present

## 2014-06-16 DIAGNOSIS — Z91013 Allergy to seafood: Secondary | ICD-10-CM

## 2014-06-16 DIAGNOSIS — Z8546 Personal history of malignant neoplasm of prostate: Secondary | ICD-10-CM

## 2014-06-16 DIAGNOSIS — I25110 Atherosclerotic heart disease of native coronary artery with unstable angina pectoris: Secondary | ICD-10-CM | POA: Diagnosis present

## 2014-06-16 DIAGNOSIS — Z9861 Coronary angioplasty status: Secondary | ICD-10-CM

## 2014-06-16 DIAGNOSIS — I200 Unstable angina: Secondary | ICD-10-CM | POA: Diagnosis present

## 2014-06-16 DIAGNOSIS — R079 Chest pain, unspecified: Secondary | ICD-10-CM | POA: Diagnosis present

## 2014-06-16 DIAGNOSIS — F4310 Post-traumatic stress disorder, unspecified: Secondary | ICD-10-CM | POA: Diagnosis present

## 2014-06-16 LAB — COMPREHENSIVE METABOLIC PANEL
ALBUMIN: 3.7 g/dL (ref 3.5–5.2)
ALT: 16 U/L (ref 0–53)
ALT: 17 U/L (ref 0–53)
ANION GAP: 13 (ref 5–15)
AST: 19 U/L (ref 0–37)
AST: 20 U/L (ref 0–37)
Albumin: 3.4 g/dL — ABNORMAL LOW (ref 3.5–5.2)
Alkaline Phosphatase: 69 U/L (ref 39–117)
Alkaline Phosphatase: 60 U/L (ref 39–117)
Anion gap: 11 (ref 5–15)
BILIRUBIN TOTAL: 0.9 mg/dL (ref 0.3–1.2)
BUN: 16 mg/dL (ref 6–23)
BUN: 15 mg/dL (ref 6–23)
CHLORIDE: 105 meq/L (ref 96–112)
CO2: 24 mEq/L (ref 19–32)
CO2: 24 mEq/L (ref 19–32)
Calcium: 8.9 mg/dL (ref 8.4–10.5)
Calcium: 8.8 mg/dL (ref 8.4–10.5)
Chloride: 104 mEq/L (ref 96–112)
Creatinine, Ser: 1.16 mg/dL (ref 0.50–1.35)
Creatinine, Ser: 1.13 mg/dL (ref 0.50–1.35)
GFR calc Af Amer: 68 mL/min — ABNORMAL LOW (ref >90)
GFR calc Af Amer: 70 mL/min — ABNORMAL LOW (ref >90)
GFR calc non Af Amer: 59 mL/min — ABNORMAL LOW (ref >90)
GFR calc non Af Amer: 61 mL/min — ABNORMAL LOW (ref >90)
Glucose, Bld: 110 mg/dL — ABNORMAL HIGH (ref 70–99)
Glucose, Bld: 130 mg/dL — ABNORMAL HIGH (ref 70–99)
Potassium: 3.9 mEq/L (ref 3.7–5.3)
Potassium: 4.1 mEq/L (ref 3.7–5.3)
Sodium: 142 mEq/L (ref 137–147)
Sodium: 139 mEq/L (ref 137–147)
TOTAL PROTEIN: 7.1 g/dL (ref 6.0–8.3)
Total Bilirubin: 0.6 mg/dL (ref 0.3–1.2)
Total Protein: 6.5 g/dL (ref 6.0–8.3)

## 2014-06-16 LAB — CBC
HCT: 40.8 % (ref 39.0–52.0)
Hemoglobin: 13.7 g/dL (ref 13.0–17.0)
MCH: 30.5 pg (ref 26.0–34.0)
MCHC: 33.6 g/dL (ref 30.0–36.0)
MCV: 90.9 fL (ref 78.0–100.0)
Platelets: 306 10*3/uL (ref 150–400)
RBC: 4.49 MIL/uL (ref 4.22–5.81)
RDW: 12.4 % (ref 11.5–15.5)
WBC: 11.8 10*3/uL — ABNORMAL HIGH (ref 4.0–10.5)

## 2014-06-16 LAB — CBC WITH DIFFERENTIAL/PLATELET
BASOS PCT: 0 % (ref 0–1)
Basophils Absolute: 0.1 10*3/uL (ref 0.0–0.1)
EOS ABS: 0.9 10*3/uL — AB (ref 0.0–0.7)
Eosinophils Relative: 7 % — ABNORMAL HIGH (ref 0–5)
HEMATOCRIT: 44.8 % (ref 39.0–52.0)
HEMOGLOBIN: 15.3 g/dL (ref 13.0–17.0)
Lymphocytes Relative: 38 % (ref 12–46)
Lymphs Abs: 4.8 10*3/uL — ABNORMAL HIGH (ref 0.7–4.0)
MCH: 31.2 pg (ref 26.0–34.0)
MCHC: 34.2 g/dL (ref 30.0–36.0)
MCV: 91.4 fL (ref 78.0–100.0)
MONO ABS: 1.2 10*3/uL — AB (ref 0.1–1.0)
MONOS PCT: 9 % (ref 3–12)
Neutro Abs: 5.8 10*3/uL (ref 1.7–7.7)
Neutrophils Relative %: 46 % (ref 43–77)
Platelets: 349 10*3/uL (ref 150–400)
RBC: 4.9 MIL/uL (ref 4.22–5.81)
RDW: 12.5 % (ref 11.5–15.5)
WBC: 12.7 10*3/uL — ABNORMAL HIGH (ref 4.0–10.5)

## 2014-06-16 LAB — LIPID PANEL
Cholesterol: 122 mg/dL (ref 0–200)
HDL: 55 mg/dL (ref >39)
LDL Cholesterol: 44 mg/dL (ref 0–99)
Total CHOL/HDL Ratio: 2.2 RATIO
Triglycerides: 113 mg/dL (ref <150)
VLDL: 23 mg/dL (ref 0–40)

## 2014-06-16 LAB — PROTIME-INR
INR: 0.97 (ref 0.00–1.49)
INR: 1.14 (ref 0.00–1.49)
Prothrombin Time: 13 seconds (ref 11.6–15.2)
Prothrombin Time: 14.8 seconds (ref 11.6–15.2)

## 2014-06-16 LAB — TROPONIN I: Troponin I: <0.3 ng/mL (ref <0.30)

## 2014-06-16 LAB — HEPARIN LEVEL (UNFRACTIONATED): Heparin Unfractionated: 0.47 IU/mL (ref 0.30–0.70)

## 2014-06-16 MED ORDER — ASPIRIN 81 MG PO CHEW
81.0000 mg | CHEWABLE_TABLET | ORAL | Status: AC
  Administered 2014-06-17: 81 mg via ORAL
  Filled 2014-06-16: qty 1

## 2014-06-16 MED ORDER — ACETAMINOPHEN ER 650 MG PO TBCR: 650.0000 mg | EXTENDED_RELEASE_TABLET | Freq: Two times a day (BID) | ORAL | Status: DC | PRN

## 2014-06-16 MED ORDER — HEPARIN BOLUS VIA INFUSION
4000.0000 [IU] | Freq: Once | INTRAVENOUS | Status: AC
  Administered 2014-06-16: 4000 [IU] via INTRAVENOUS

## 2014-06-16 MED ORDER — SODIUM CHLORIDE 0.9 % IV SOLN: 250.0000 mL | INTRAVENOUS | Status: DC | PRN

## 2014-06-16 MED ORDER — ONDANSETRON HCL 4 MG PO TABS: 8.0000 mg | ORAL_TABLET | Freq: Three times a day (TID) | ORAL | Status: DC | PRN

## 2014-06-16 MED ORDER — ASPIRIN 81 MG PO CHEW: 81.0000 mg | CHEWABLE_TABLET | ORAL | Status: DC

## 2014-06-16 MED ORDER — ONDANSETRON HCL 4 MG/2ML IJ SOLN
4.0000 mg | Freq: Three times a day (TID) | INTRAMUSCULAR | Status: DC | PRN
  Administered 2014-06-17: 4 mg via INTRAVENOUS
  Filled 2014-06-16: qty 2

## 2014-06-16 MED ORDER — ALPRAZOLAM 0.25 MG PO TABS: 0.2500 mg | ORAL_TABLET | Freq: Every evening | ORAL | Status: DC | PRN

## 2014-06-16 MED ORDER — LOSARTAN POTASSIUM 25 MG PO TABS
25.0000 mg | ORAL_TABLET | Freq: Two times a day (BID) | ORAL | Status: DC
  Administered 2014-06-16 – 2014-06-18 (×4): 25 mg via ORAL
  Filled 2014-06-16 (×6): qty 1

## 2014-06-16 MED ORDER — METOPROLOL TARTRATE 50 MG PO TABS
50.0000 mg | ORAL_TABLET | Freq: Two times a day (BID) | ORAL | Status: DC
  Administered 2014-06-16 – 2014-06-18 (×4): 50 mg via ORAL
  Filled 2014-06-16 (×6): qty 1

## 2014-06-16 MED ORDER — SODIUM CHLORIDE 0.9 % IJ SOLN
3.0000 mL | Freq: Two times a day (BID) | INTRAMUSCULAR | Status: DC
  Administered 2014-06-17: 3 mL via INTRAVENOUS

## 2014-06-16 MED ORDER — ASPIRIN EC 81 MG PO TBEC
81.0000 mg | DELAYED_RELEASE_TABLET | Freq: Every day | ORAL | Status: DC
  Administered 2014-06-16 – 2014-06-18 (×2): 81 mg via ORAL
  Filled 2014-06-16 (×3): qty 1

## 2014-06-16 MED ORDER — ASPIRIN 81 MG PO CHEW
324.0000 mg | CHEWABLE_TABLET | Freq: Once | ORAL | Status: AC
  Administered 2014-06-16: 324 mg via ORAL
  Filled 2014-06-16: qty 4

## 2014-06-16 MED ORDER — NITROGLYCERIN 0.4 MG SL SUBL
0.4000 mg | SUBLINGUAL_TABLET | SUBLINGUAL | Status: DC | PRN
  Administered 2014-06-16 (×2): 0.4 mg via SUBLINGUAL
  Filled 2014-06-16 (×2): qty 1

## 2014-06-16 MED ORDER — SODIUM CHLORIDE 0.9 % IV SOLN
Freq: Once | INTRAVENOUS | Status: AC
  Administered 2014-06-16: 09:00:00 via INTRAVENOUS

## 2014-06-16 MED ORDER — HEPARIN (PORCINE) IN NACL 100-0.45 UNIT/ML-% IJ SOLN
1250.0000 [IU]/h | INTRAMUSCULAR | Status: DC
  Administered 2014-06-16 – 2014-06-17 (×2): 1250 [IU]/h via INTRAVENOUS
  Filled 2014-06-16 (×3): qty 250

## 2014-06-16 MED ORDER — ACETAMINOPHEN 325 MG PO TABS: 325.0000 mg | ORAL_TABLET | Freq: Four times a day (QID) | ORAL | Status: DC | PRN

## 2014-06-16 MED ORDER — PRAVASTATIN SODIUM 40 MG PO TABS
40.0000 mg | ORAL_TABLET | Freq: Every day | ORAL | Status: DC
  Administered 2014-06-16: 40 mg via ORAL
  Filled 2014-06-16 (×3): qty 1

## 2014-06-16 MED ORDER — PANTOPRAZOLE SODIUM 40 MG PO TBEC
80.0000 mg | DELAYED_RELEASE_TABLET | Freq: Every day | ORAL | Status: DC
  Administered 2014-06-16 – 2014-06-17 (×2): 80 mg via ORAL
  Administered 2014-06-18 (×2): 40 mg via ORAL
  Filled 2014-06-16 (×5): qty 2

## 2014-06-16 MED ORDER — ISOSORBIDE MONONITRATE ER 30 MG PO TB24
30.0000 mg | ORAL_TABLET | Freq: Every day | ORAL | Status: DC
  Administered 2014-06-17 – 2014-06-18 (×2): 30 mg via ORAL
  Filled 2014-06-16 (×3): qty 1

## 2014-06-16 MED ORDER — NITROGLYCERIN 0.4 MG SL SUBL: 0.4000 mg | SUBLINGUAL_TABLET | SUBLINGUAL | Status: DC | PRN

## 2014-06-16 MED ORDER — SODIUM CHLORIDE 0.9 % IJ SOLN: 3.0000 mL | INTRAMUSCULAR | Status: DC | PRN

## 2014-06-16 MED ORDER — CITALOPRAM HYDROBROMIDE 10 MG PO TABS
10.0000 mg | ORAL_TABLET | Freq: Every morning | ORAL | Status: DC
  Administered 2014-06-16 – 2014-06-17 (×2): 10 mg via ORAL
  Filled 2014-06-16 (×4): qty 1

## 2014-06-16 MED ORDER — ISOSORBIDE MONONITRATE ER 30 MG PO TB24: 30.0000 mg | ORAL_TABLET | Freq: Every day | ORAL | Status: DC

## 2014-06-16 MED ORDER — NITROGLYCERIN 2 % TD OINT
1.0000 [in_us] | TOPICAL_OINTMENT | Freq: Four times a day (QID) | TRANSDERMAL | Status: DC
  Administered 2014-06-16 – 2014-06-18 (×7): 1 [in_us] via TOPICAL
  Filled 2014-06-16: qty 1
  Filled 2014-06-16 (×2): qty 30

## 2014-06-16 MED ORDER — SODIUM CHLORIDE 0.9 % IV SOLN
INTRAVENOUS | Status: DC
  Administered 2014-06-17: 05:00:00 via INTRAVENOUS

## 2014-06-16 MED ORDER — SODIUM CHLORIDE 0.9 % IJ SOLN: 3.0000 mL | Freq: Two times a day (BID) | INTRAMUSCULAR | Status: DC

## 2014-06-16 NOTE — ED Notes (Signed)
PT also c/o cough with sputum production x2 weeks. PT denies any medications this am.

## 2014-06-16 NOTE — ED Notes (Signed)
Called [facility] west to given report, nurse not available.  Will return call in 15  Minutes.

## 2014-06-16 NOTE — Progress Notes (Signed)
ANTICOAGULATION CONSULT NOTE - Follow Up Consult  Pharmacy Consult for Heparin Indication: chest pain/ACS  Allergies  Allergen Reactions  . Shellfish Allergy Anaphylaxis  . Sulfa Antibiotics Diarrhea and Nausea Only  . Sulfonamide Derivatives Diarrhea and Nausea Only    Patient Measurements: Height: 5\' 11"  (180.3 cm) Weight: 218 lb (98.884 kg) IBW/kg (Calculated) : 75.3 Heparin Dosing Weight:   Vital Signs: Temp: 98.9 F (37.2 C) (10/18 1934) Temp Source: Oral (10/18 1934) BP: 137/83 mmHg (10/18 1934) Pulse Rate: 81 (10/18 1934)  Labs:  Recent Labs  06/16/14 0715 06/16/14 1406 06/16/14 1800  HGB 15.3 13.7  --   HCT 44.8 40.8  --   PLT 349 306  --   LABPROT 13.0 14.8  --   INR 0.97 1.14  --   HEPARINUNFRC  --   --  0.47  CREATININE 1.16 1.13  --   TROPONINI <0.30  --   --     Estimated Creatinine Clearance: 65.6 ml/min (by C-G formula based on Cr of 1.13).   Medications:  Scheduled:  . [START ON 06/17/2014] aspirin  81 mg Oral Pre-Cath  . aspirin EC  81 mg Oral Daily  . citalopram  10 mg Oral q morning - 10a  . isosorbide mononitrate  30 mg Oral Daily  . losartan  25 mg Oral BID  . metoprolol  50 mg Oral BID  . nitroGLYCERIN  1 inch Topical 4 times per day  . pantoprazole  80 mg Oral Daily  . pravastatin  40 mg Oral q1800  . sodium chloride  3 mL Intravenous Q12H  . sodium chloride  3 mL Intravenous Q12H    Assessment: 79yo male with ACS, for cath in AM.  Heparin initiated at [facility], currently on 1250 units/hr.  First Heparin level resulted as 0.47.  No bleeding problems noted per d/w RN.  Pt with anaphylaxis to iodine/shellfish, for cath in AM per MD note.  RN to notify cards.  Goal of Therapy:  Heparin level 0.3-0.7 units/ml Monitor platelets by anticoagulation protocol: Yes   Plan:  1-  Continue Heparin 1250 units/hr 2-  Heparin level in AM  Gracy Bruins, PharmD [facility]

## 2014-06-16 NOTE — ED Notes (Signed)
PT experiencing central chest pain x2 days with SOB and nausea. PT states he was walking when it started.

## 2014-06-16 NOTE — ED Notes (Signed)
MD at bedside. 

## 2014-06-16 NOTE — Progress Notes (Signed)
ANTICOAGULATION CONSULT NOTE - Initial Consult  Pharmacy Consult for Heparin Indication: chest pain/ACS , Unstable Angina  Allergies  Allergen Reactions  . Shellfish Allergy Anaphylaxis  . Sulfa Antibiotics Diarrhea  . Sulfonamide Derivatives Diarrhea    Patient Measurements: Height: 5\' 11"  (180.3 cm) Weight: 218 lb (98.884 kg) IBW/kg (Calculated) : 75.3 Heparin Dosing Weight: 94 kg  Vital Signs: Temp: 97.9 F (36.6 C) (10/18 0702) Temp Source: Oral (10/18 0702) BP: 146/81 mmHg (10/18 0815) Pulse Rate: 65 (10/18 0815)  Labs:  Recent Labs  06/16/14 0715  HGB 15.3  HCT 44.8  PLT 349  CREATININE 1.16  TROPONINI <0.30    Estimated Creatinine Clearance: 63.9 ml/min (by C-G formula based on Cr of 1.16).   Medical History: Past Medical History  Diagnosis Date  . Premature ventricular contraction   . Prostate cancer 2010  . 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 Radiation Feb 2011    treatment  . Biliary dyskinesia   . GERD (gastroesophageal reflux disease)   . Hiatal hernia 2010    tiny  . HTN (hypertension)   . Rosacea   . PTSD (post-traumatic stress disorder)   . Angina pectoris, unstable   . Diverticula of colon 2010  . Colon polyps     tubular adenomas  . Arthritis   . Exposure to [employer]   . CAD (coronary artery disease)     hx of 2 stents   . Peripheral neuropathy   . Anemia     hx of anemia as a child   . Colon cancer 12/2011    s/p right hemicolectomy, did not tolerate chemo  . Iron deficiency 05/02/2012    followed by Dr. Tressie [name], iron infusions.   . Folic acid deficiency   . Heart palpitations 10/2012  . Gastroparesis 04/12/2013    borderline  . Myocardial infarction 3 yrs ago  . Glaucoma   . Abnormality of gait 12/18/2013   Medications:  Scheduled:  . nitroGLYCERIN  1 inch Topical 4 times per day   Home meds reviewed, finalized list pending.  Assessment: Okay for Protocol, 79 yo male experiencing central chest pain x2 days with  SOB and nausea.  Baseline PT/INR pending.  Goal of Therapy:  Heparin level 0.3-0.7 units/ml Monitor platelets by anticoagulation protocol: Yes   Plan:  Give 4000 units bolus x 1 Start heparin infusion at 1250 units/hr Check anti-Xa level in 6-8 hours and daily while on heparin Continue to monitor H&H and platelets  Biagio Quint R 06/16/2014,8:55 AM

## 2014-06-16 NOTE — ED Notes (Signed)
Pt's wife says, pt vacuumed entire house on Friday, which he never does and has been coughing up thick white sputum.  Wife also states that pt was treated sinus infection about 6 weeks ago and was treated with antibiotics.  Pt currently says he is not having chest pain, more of a pressure when coughing.

## 2014-06-16 NOTE — ED Notes (Signed)
Report given to Estanislado Pandy, RN.  Pt transferring to [id number] via [facility] at this time.

## 2014-06-16 NOTE — Progress Notes (Signed)
This nurse attempted x1 to start second peripheral IV site, attempt was unsuccessful.

## 2014-06-16 NOTE — ED Provider Notes (Signed)
CSN: [id number]     Arrival date & time 06/16/14  9735 History  This chart was scribed for Tanna Furry, MD by Tula Nakayama, ED Scribe. This patient was seen in room APA19/AP[facility]19 and the patient's care was started at 7:12 AM.     Chief Complaint  Patient presents with  . Chest Pain   The history is provided by the patient. No language interpreter was used.   HPI Comments: HY SWIATEK is a 79 y.o. male with a history of CAD and stents who presents to the Emergency Department complaining of intermittent, central, non-radiating chest tightness that started 2 days ago. His current episode started 1 hour PTA. Pt states cough, rhinorrhea, nausea, and mild SOB as associated symptoms. Pt first noticed symptoms when he was driving to the airport 2 nights ago and had a recurrence of symptoms when he was exercising on his elliptical yesterday morning. Pt states that chest tightness continued throughout the day and became worse at night. He states he took NTG on both days with relief to symptoms. Pt denies fever, dizziness, diaphoresis, pain with breathing, and swelling in his feet as associated symptoms. Pt does not remember whether his current symptoms are similar to his last MI that occurred 3 years ago. Pt was in the ED in 5 months ago and was diagnosed with Shingles. He states that current symptoms are not similar. Pt is taking allergy medicine for seasonal allergies.   Past Medical History  Diagnosis Date  . Premature ventricular contraction   . Prostate cancer 2010  . 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 Radiation Feb 2011    treatment  . Biliary dyskinesia   . GERD (gastroesophageal reflux disease)   . Hiatal hernia 2010    tiny  . HTN (hypertension)   . Rosacea   . PTSD (post-traumatic stress disorder)   . Angina pectoris, unstable   . Diverticula of colon 2010  . Colon polyps     tubular adenomas  . Arthritis   . Exposure to [employer]   . CAD (coronary artery disease)      hx of 2 stents   . Peripheral neuropathy   . Anemia     hx of anemia as a child   . Colon cancer 12/2011    s/p right hemicolectomy, did not tolerate chemo  . Iron deficiency 05/02/2012    followed by Dr. Tressie [name], iron infusions.   . Folic acid deficiency   . Heart palpitations 10/2012  . Gastroparesis 04/12/2013    borderline  . Myocardial infarction 3 yrs ago  . Glaucoma   . Abnormality of gait 12/18/2013   Past Surgical History  Procedure Laterality Date  . Cholecystectomy    . Prostatectomy    . Tonsillectomy    . Transurethral resection of prostate    . Esophagogastroduodenoscopy  10/10/2008    Dr. Tilda Burrow hiatal hernia, normal esophagus, normal stomach  . Colonoscopy  02/12/2004    Dr. Gala Romney- L side diverticula, inflammatory  colon polyps  . Esophagogastroduodenoscopy  02/12/2004    Dr. Dudley Major- normal   . Coronary stent placement  01/2011    2.5x6mm Xience drug-eluting stent in ramus intermedius/OMI vessel, 2.5x45mm Promus Element stent in mid LAD artery  . 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 Multiple colonic  polyps removed as described above. Sigmoid Diverticulosis/ 1.5 x 2 cm relatively flat ulcerated lesion in cecum/path  showed adenocarcinoma of the colon. descending colon polyp with tubular adenoma and one fragment of focal high grade dysplasia.   . Knee surgery      left knee   . Partial colectomy  02/29/2012    Procedure: PARTIAL COLECTOMY;  Surgeon: Adin Hector, MD;  Location: [facility];  Service: General;  Laterality: Right;  . Vasectomy    . Colonoscopy  08/28/2012    YQI:[id number] proctitis. Colonic diverticulosis/Ulcerations at the surgical anastomosis  path: ulcerated colonic mucosa with prolapse changes, tubular adenoma.   . Cardiac catheterization  02/13/2011    [facility] cardiology  . Cataract extraction      2011  . Colonoscopy N/A 08/02/2013    Procedure:  COLONOSCOPY;  Surgeon: Daneil Dolin, MD;  Location: [facility] SUITE;  Service: Endoscopy;  Laterality: N/A;  1200  . Cataract extraction w/phaco Right 08/06/2013    Procedure: CATARACT EXTRACTION PHACO AND INTRAOCULAR LENS PLACEMENT (IOC);  Surgeon: Tonny Branch, MD;  Location: [facility];  Service: Ophthalmology;  Laterality: Right;  CDE:20.96   Family History  Problem Relation Age of Onset  . Throat cancer Father   . Cancer Father     Throat  . Prostate cancer Brother   . Cancer Brother     Prostate and Skin  . Neuropathy Brother   . Colon cancer Neg Hx    History  Substance Use Topics  . Smoking status: Never Smoker   . Smokeless tobacco: Never Used  . Alcohol Use: Yes     Comment: 1 cocktail/month    Review of Systems  Constitutional: Negative for fever, chills, diaphoresis, appetite change and fatigue.  HENT: Positive for rhinorrhea. Negative for mouth sores, sore throat and trouble swallowing.   Eyes: Negative for visual disturbance.  Respiratory: Positive for cough, chest tightness and shortness of breath. Negative for wheezing.   Cardiovascular: Positive for chest pain. Negative for leg swelling.  Gastrointestinal: Negative for nausea, vomiting, abdominal pain, diarrhea and abdominal distention.  Endocrine: Negative for polydipsia, polyphagia and polyuria.  Genitourinary: Negative for dysuria, frequency and hematuria.  Musculoskeletal: Negative for gait problem.  Skin: Negative for color change, pallor and rash.  Allergic/Immunologic: Positive for environmental allergies.  Neurological: Negative for dizziness, syncope, light-headedness and headaches.  Hematological: Does not bruise/bleed easily.  Psychiatric/Behavioral: Negative for behavioral problems and confusion.      Allergies  Shellfish allergy; Sulfa antibiotics; and Sulfonamide derivatives  Home Medications   Prior to Admission medications   Medication Sig Start Date End Date Taking? Authorizing Provider   acetaminophen (TYLENOL) 650 MG CR tablet Take 650 mg by mouth 2 (two) times daily as needed for pain.     Historical Provider, MD  ALPRAZolam Duanne Moron) 0.25 MG tablet Take 0.25 mg by mouth at bedtime as needed. Anxiety. 03/24/12   Pieter Partridge, MD  aspirin EC 81 MG tablet Take 81 mg by mouth daily.    Historical Provider, MD  B Complex Vitamins (VITAMIN B COMPLEX PO) Take 1 tablet by mouth daily.     Historical Provider, MD  cetirizine (ZYRTEC) 10 MG tablet Take 10 mg by mouth daily.    Historical Provider, MD  citalopram (CELEXA) 10 MG tablet Take 10 mg by mouth every morning.     Historical Provider, MD  folic acid (FOLVITE) 1 MG tablet Take 1 mg by mouth once a week. Sunday. 03/27/12   Pieter Partridge, MD  latanoprost (XALATAN) 0.005 % ophthalmic solution Place 1 drop into both eyes at  bedtime.     Historical Provider, MD  losartan (COZAAR) 50 MG tablet Take 25 mg by mouth 2 (two) times daily.     Historical Provider, MD  metoCLOPramide (REGLAN) 10 MG tablet Take 1 tablet (10 mg total) by mouth every 6 (six) hours as needed for nausea or vomiting. 10/26/13   [name], MD  metoprolol (LOPRESSOR) 50 MG tablet Take 50 mg by mouth 2 (two) times daily.     Historical Provider, MD  Multiple Vitamin (MULTIVITAMIN WITH MINERALS) TABS tablet Take 1 tablet by mouth daily.    Historical Provider, MD  nitroGLYCERIN (NITROSTAT) 0.4 MG SL tablet Place 0.4 mg under the tongue every 5 (five) minutes x 3 doses as needed. For chest pain    Historical Provider, MD  omeprazole (PRILOSEC) 40 MG capsule Take 40 mg by mouth daily.    Historical Provider, MD  ondansetron (ZOFRAN) 8 MG tablet Take 8 mg by mouth every 8 (eight) hours as needed for nausea or vomiting.    Historical Provider, MD  pravastatin (PRAVACHOL) 40 MG tablet Take 40 mg by mouth at bedtime.     Historical Provider, MD  pravastatin (PRAVACHOL) 40 MG tablet TAKE ONE TABLET DAILY AT BEDTIME. 02/06/14   Mihai Croitoru, MD  Probiotic Product  (PROBIOTIC DAILY PO) Take 1 tablet by mouth daily.     Historical Provider, MD  traMADol (ULTRAM) 50 MG tablet Take 1 tablet (50 mg total) by mouth every 6 (six) hours as needed. 01/09/14   Maudry Diego, MD   BP 146/81  Pulse 65  Temp(Src) 97.9 F (36.6 C) (Oral)  Resp 18  Ht 5\' 11"  (1.803 m)  Wt 218 lb (98.884 kg)  BMI 30.42 kg/m2  SpO2 100% Physical Exam  Constitutional: He is oriented to person, place, and time. He appears well-developed and well-nourished. No distress.  HENT:  Head: Normocephalic.  Eyes: Conjunctivae are normal. Pupils are equal, round, and reactive to light. No scleral icterus.  Neck: Normal range of motion. Neck supple. No thyromegaly present.  Cardiovascular: Normal rate and regular rhythm.  Exam reveals no gallop and no friction rub.   No murmur heard. Pulmonary/Chest: Effort normal and breath sounds normal. No respiratory distress. He has no wheezes. He has no rales.  Abdominal: Soft. Bowel sounds are normal. He exhibits no distension. There is no tenderness. There is no rebound.  Musculoskeletal: Normal range of motion.  Neurological: He is alert and oriented to person, place, and time. Coordination abnormal.  Skin: Skin is warm and dry. No rash noted.  Psychiatric: He has a normal mood and affect. His behavior is normal.    ED Course  CRITICAL CARE Performed by: Kaliyah Gladman, [place] by: Tanna Furry Total critical care time: 30 minutes Critical care start time: 06/16/2014 8:30 AM Critical care end time: 06/16/2014 9:00 AM Critical care time was exclusive of separately billable procedures and treating other patients and teaching time. Critical care was necessary to treat or prevent imminent or life-threatening deterioration of the following conditions: Unstable angina. Critical care was time spent personally by me on the following activities: blood draw for specimens, development of treatment plan with patient or surrogate, discussions with  consultants, interpretation of cardiac output measurements, evaluation of patient's response to treatment, examination of patient, obtaining history from patient or surrogate, ordering and performing treatments and interventions, ordering and review of laboratory studies, ordering and review of radiographic studies, pulse oximetry, re-evaluation of patient's condition and review of old charts.   (  including critical care time)  DIAGNOSTIC STUDIES: Oxygen Saturation is 100% on RA, normal by my interpretation.    COORDINATION OF CARE: 7:19 AM Will order NTG and aspirin. Will order lab work and angiogram. Discussed treatment plan with pt at bedside and pt agreed to plan.    Labs Review Labs Reviewed  CBC WITH DIFFERENTIAL - Abnormal; Notable for the following:    WBC 12.7 (*)    Lymphs Abs 4.8 (*)    Monocytes Absolute 1.2 (*)    Eosinophils Relative 7 (*)    Eosinophils Absolute 0.9 (*)    All other components within normal limits  COMPREHENSIVE METABOLIC PANEL - Abnormal; Notable for the following:    Glucose, Bld 110 (*)    GFR calc non Af Amer 59 (*)    GFR calc Af Amer 68 (*)    All other components within normal limits  TROPONIN I  PROTIME-INR    Imaging Review Dg Chest Portable 1 View  06/16/2014   CLINICAL DATA:  Chest pain and cough.  EXAM: PORTABLE CHEST - 1 VIEW  COMPARISON:  01/08/2014  FINDINGS: Lordotic technique is demonstrated. Lungs are adequately inflated without consolidation or effusion. There is borderline stable cardiomegaly. Remainder of the exam is unchanged.  IMPRESSION: No acute cardiopulmonary disease.  Stable borderline cardiomegaly.   Electronically Signed   By: Marin Olp M.D.   On: 06/16/2014 07:32     EKG Interpretation   Date/Time:  Sunday June 16 2014 07:02:31 EDT Ventricular Rate:  68 PR Interval:  176 QRS Duration: 117 QT Interval:  428 QTC Calculation: 455 R Axis:   37 Text Interpretation:  Sinus rhythm Nonspecific intraventricular  conduction  delay Borderline T abnormalities, anterior leads Confirmed by Jeneen Rinks  MD,  [place] ([id number]) on 06/16/2014 7:21:58 AM     Cath 2012  FINDINGS:  1. Left main - calcified, mild disease less than 30%.  2. LAD - diffuse tapering disease to a small vessel distally. There  was a 95% mid stenosis approximately 2.5 mm caliber. There was a  90% distal stenosis, however, the vessel is probably 1.5 mm at this  segment. The LAD was noted to wrap around the apex.  3. Ramus intermedius - there is a 50-60% tubular proximal stenosis of  the ramus intermedius, approximately 30-40 mm in length.  4. Left circumflex - there is a moderate disease up to 50% and the  vessel tapers distally. There is a OM1 branch that has a 95%  proximal discrete lesion. This is a good-size vessel.  5. RCA - there is a 50% midvessel discrete stenosis and a 30% distal  RCA stenosis. There is additionally a 90% distal PLB stenosis in a  very small branch.  6. LVEDP = 12 mmHg.  7. Left ventriculogram - LVEF >60%, no mitral regurgitation, no wall  motion abnormalities.  IMPRESSION:  1. Severe mid to distal left anterior descending stenosis (the distal  left anterior descending is likely too small for intervention).  2. Severe proximal obtuse marginal 1 disease.  3. 60-70% tubular proximal ramus disease.  4. 90% distal posterolateral branch stenosis again of a small vessel.  After reviewing the films, it was decided to proceed with percutaneous  intervention of his LAD and ramus intravenous branch with medical  treatment of the other lesions.       MDM   Final diagnoses:  Unstable angina   Patient presents with symptoms consistent with classic unstable angina. Symptoms starting 2 days ago rather  suddenly. During the week he was still able to continue his one half hour on his eliptical machine each morning without symptoms. Begin with symptoms Friday, 2 days ago. Exertional chest tightness at the airport with  "fast walking" to get to his son,2 days ago which resolved with nitroglycerin, and rest over 5-10 minutes.  Only able to go a few minutes on the treadmill yesterday morning, vs his normal 30 minutes. . Again, took the nitroglycerin last night. He awakened this morning with pain at rest.  In review of his cath from 2012 he had PCI to LAD, proximal, as well as ramus intermedius. Had significant disease in some other smaller vessels as well including the OM1 off the circumflex.   EKG today without acute changes, and initial troponin normal. Patient symptom free after single SL NTG. 1" paste applied. Given 324mg  ASA.   This with Dr. Meda Coffee from [place] will be transfer to [facility]  for further evaluation and testing, probable angiogram.    I personally performed the services described in this documentation, which was scribed in my presence. The recorded information has been reviewed and is accurate.       Tanna Furry, MD 06/16/14 [phone]

## 2014-06-16 NOTE — ED Notes (Signed)
David Pugh, to return call for report.  [facility] in for transport.

## 2014-06-16 NOTE — H&P (Signed)
Patient ID: David Pugh MRN: [id number], DOB/AGE: 1934-12-26   Admit date: 06/16/2014   Primary Physician: [name], MD Primary Cardiologist: Dr Sallyanne Kuster  Pt. Profile:  Unstable angina, transfer from [facility]  Problem List  Past Medical History  Diagnosis Date  . Premature ventricular contraction   . Prostate cancer 2010  . 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 Radiation Feb 2011    treatment  . Biliary dyskinesia   . GERD (gastroesophageal reflux disease)   . Hiatal hernia 2010    tiny  . HTN (hypertension)   . Rosacea   . PTSD (post-traumatic stress disorder)   . Angina pectoris, unstable   . Diverticula of colon 2010  . Colon polyps     tubular adenomas  . Arthritis   . Exposure to [employer]   . CAD (coronary artery disease)     hx of 2 stents   . Peripheral neuropathy   . Anemia     hx of anemia as a child   . Colon cancer 12/2011    s/p right hemicolectomy, did not tolerate chemo  . Iron deficiency 05/02/2012    followed by Dr. Tressie [name], iron infusions.   . Folic acid deficiency   . Heart palpitations 10/2012  . Gastroparesis 04/12/2013    borderline  . Myocardial infarction 3 yrs ago  . Glaucoma   . Abnormality of gait 12/18/2013    Past Surgical History  Procedure Laterality Date  . Cholecystectomy    . Prostatectomy    . Tonsillectomy    . Transurethral resection of prostate    . Esophagogastroduodenoscopy  10/10/2008    Dr. Tilda Burrow hiatal hernia, normal esophagus, normal stomach  . Colonoscopy  02/12/2004    Dr. Gala Romney- L side diverticula, inflammatory  colon polyps  . Esophagogastroduodenoscopy  02/12/2004    Dr. Dudley Major- normal   . Coronary stent placement  01/2011    2.5x6mm Xience drug-eluting stent in ramus intermedius/OMI vessel, 2.5x11mm Promus Element stent in mid LAD artery  . 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 Multiple colonic   polyps removed as described above. Sigmoid Diverticulosis/ 1.5 x 2 cm relatively flat ulcerated lesion in cecum/path showed adenocarcinoma of the colon. descending colon polyp with tubular adenoma and one fragment of focal high grade dysplasia.   . Knee surgery      left knee   . Partial colectomy  02/29/2012    Procedure: PARTIAL COLECTOMY;  Surgeon: Adin Hector, MD;  Location: [facility] ORS;  Service: General;  Laterality: Right;  . Vasectomy    . Colonoscopy  08/28/2012    MBT:DHRCBULAG proctitis. Colonic diverticulosis/Ulcerations at the surgical anastomosis  path: ulcerated colonic mucosa with prolapse changes, tubular adenoma.   . Cardiac catheterization  02/13/2011    [facility] cardiology  . Cataract extraction      2011  . Colonoscopy N/A 08/02/2013    Procedure: COLONOSCOPY;  Surgeon: Daneil Dolin, MD;  Location: [facility] ENDO SUITE;  Service: Endoscopy;  Laterality: N/A;  1200  . Cataract extraction w/phaco Right 08/06/2013    Procedure: CATARACT EXTRACTION PHACO AND INTRAOCULAR LENS PLACEMENT (IOC);  Surgeon: Tonny Branch, MD;  Location: [facility] ORS;  Service: Ophthalmology;  Laterality: Right;  CDE:20.96     Allergies  Allergies  Allergen Reactions  . Shellfish Allergy Anaphylaxis  . Sulfa Antibiotics Diarrhea  . Sulfonamide Derivatives Diarrhea  HPI  Patient is a 79 y.o. male with a PMHx of CAD, who was transferred from [facility] on 06/16/2014 for evaluation of unstable angina.  David Pugh is a 79 y.o. male with a history of CAD, s/p 2.5x62mm Xience drug-eluting stent in ramus intermedius/OMI vessel, 2.5x40mm Promus Element stent in mid LAD artery in 2012, followed by Dr Sallyanne Kuster in the clinic and stable until  few days ago when  developed intermittent, central, non-radiating chest tightness related to exertion and relieved by rest and sl NTG. It has progressed over the last two day and he is now having episodes of resting chest pain.  Pt states cough,  rhinorrhea, nausea, and mild SOB as associated symptoms. Pt first noticed symptoms when he was driving to the airport 2 nights ago and had a recurrence of symptoms when he was exercising on his elliptical yesterday morning. Pt states that chest tightness continued throughout the day and became worse at night. He states he took NTG on both days with relief to symptoms. Pt denies fever, dizziness, diaphoresis, pain with breathing, and swelling in his feet as associated symptoms. Pt does not remember whether his current symptoms are similar to his last MI that occurred 3 years ago. Pt was in the ED in 5 months ago and was diagnosed with Shingles. He states that current symptoms are not similar. Pt is taking allergy medicine for seasonal allergies.  Chest pain free right now.  Home Medications  Prior to Admission medications   Medication Sig Start Date End Date Taking? Authorizing Provider  acetaminophen (TYLENOL) 650 MG CR tablet Take 650 mg by mouth 2 (two) times daily as needed for pain.     Historical Provider, MD  ALPRAZolam Duanne Moron) 0.25 MG tablet Take 0.25 mg by mouth at bedtime as needed. Anxiety. 03/24/12   Pieter Partridge, MD  aspirin EC 81 MG tablet Take 81 mg by mouth daily.    Historical Provider, MD  B Complex Vitamins (VITAMIN B COMPLEX PO) Take 1 tablet by mouth daily.     Historical Provider, MD  cetirizine (ZYRTEC) 10 MG tablet Take 10 mg by mouth daily.    Historical Provider, MD  citalopram (CELEXA) 10 MG tablet Take 10 mg by mouth every morning.     Historical Provider, MD  folic acid (FOLVITE) 1 MG tablet Take 1 mg by mouth once a week. Sunday. 03/27/12   Pieter Partridge, MD  latanoprost (XALATAN) 0.005 % ophthalmic solution Place 1 drop into both eyes at bedtime.     Historical Provider, MD  losartan (COZAAR) 50 MG tablet Take 25 mg by mouth 2 (two) times daily.     Historical Provider, MD  metoCLOPramide (REGLAN) 10 MG tablet Take 1 tablet (10 mg total) by mouth every 6 (six) hours  as needed for nausea or vomiting. 10/26/13   [name], MD  metoprolol (LOPRESSOR) 50 MG tablet Take 50 mg by mouth 2 (two) times daily.     Historical Provider, MD  Multiple Vitamin (MULTIVITAMIN WITH MINERALS) TABS tablet Take 1 tablet by mouth daily.    Historical Provider, MD  nitroGLYCERIN (NITROSTAT) 0.4 MG SL tablet Place 0.4 mg under the tongue every 5 (five) minutes x 3 doses as needed. For chest pain    Historical Provider, MD  omeprazole (PRILOSEC) 40 MG capsule Take 40 mg by mouth daily.    Historical Provider, MD  ondansetron (ZOFRAN) 8 MG tablet Take 8 mg by mouth every 8 (eight)  hours as needed for nausea or vomiting.    Historical Provider, MD  pravastatin (PRAVACHOL) 40 MG tablet Take 40 mg by mouth at bedtime.     Historical Provider, MD  pravastatin (PRAVACHOL) 40 MG tablet TAKE ONE TABLET DAILY AT BEDTIME. 02/06/14   Mihai Croitoru, MD  Probiotic Product (PROBIOTIC DAILY PO) Take 1 tablet by mouth daily.     Historical Provider, MD  traMADol (ULTRAM) 50 MG tablet Take 1 tablet (50 mg total) by mouth every 6 (six) hours as needed. 01/09/14   Maudry Diego, MD    Family History  Family History  Problem Relation Age of Onset  . Throat cancer Father   . Cancer Father     Throat  . Prostate cancer Brother   . Cancer Brother     Prostate and Skin  . Neuropathy Brother   . Colon cancer Neg Hx     Social History  History   Social History  . Marital Status: Married    Spouse Name: N/A    Number of Children: 3  . 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     Social History Main Topics  . Smoking status: Never Smoker   . Smokeless tobacco: Never Used  . Alcohol Use: Yes     Comment: 1 cocktail/month  . Drug Use: No  . Sexual Activity: Yes    Birth Control/ Protection: None   Other Topics Concern  . Not on file   Social History Narrative  . No narrative on file     Review of Systems General:  No chills, fever, night sweats  or weight changes.  Cardiovascular:  No chest pain, dyspnea on exertion, edema, orthopnea, palpitations, paroxysmal nocturnal dyspnea. Dermatological: No rash, lesions/masses Respiratory: No cough, dyspnea Urologic: No hematuria, dysuria Abdominal:   No nausea, vomiting, diarrhea, bright red blood per rectum, melena, or hematemesis Neurologic:  No visual changes, wkns, changes in mental status. All other systems reviewed and are otherwise negative except as noted above.  Physical Exam  Blood pressure 143/75, pulse 65, temperature 98.3 F (36.8 C), temperature source Oral, resp. rate 19, height 5\' 11"  (1.803 m), weight 218 lb (98.884 kg), SpO2 97.00%.  General: Pleasant, NAD Psych: Normal affect. Neuro: Alert and oriented X 3. Moves all extremities spontaneously. HEENT: Normal  Neck: Supple without bruits or JVD. Lungs:  Resp regular and unlabored, CTA. Heart: RRR no s3, s4, or murmurs. Abdomen: Soft, non-tender, non-distended, BS + x 4.  Extremities: No clubbing, cyanosis or edema. DP/PT/Radials 2+ and equal bilaterally.  Labs   Recent Labs  06/16/14 0715  TROPONINI <0.30   Lab Results  Component Value Date   WBC 12.7* 06/16/2014   HGB 15.3 06/16/2014   HCT 44.8 06/16/2014   MCV 91.4 06/16/2014   PLT 349 06/16/2014    Recent Labs Lab 06/16/14 0715  NA 142  K 3.9  CL 105  CO2 24  BUN 16  CREATININE 1.16  CALCIUM 8.9  PROT 7.1  BILITOT 0.9  ALKPHOS 69  ALT 16  AST 19  GLUCOSE 110*   Lab Results  Component Value Date   CHOL 120 10/15/2012   HDL 51 10/15/2012   LDLCALC 48 10/15/2012   TRIG 104 10/15/2012   Radiology/Studies  Dg Chest Portable 1 View  06/16/2014   CLINICAL DATA:  Chest pain and cough.  EXAM: PORTABLE CHEST - 1 VIEW  COMPARISON:  01/08/2014  FINDINGS: Lordotic technique  is demonstrated. Lungs are adequately inflated without consolidation or effusion. There is borderline stable cardiomegaly. Remainder of the exam is unchanged.  IMPRESSION: No  acute cardiopulmonary disease.  Stable borderline cardiomegaly.   Electronically Signed   By: Marin Olp M.D.   On: 06/16/2014 07:32   Echocardiogram - none  ECG: SR, negative T waves in V 1-3, previously only in [place]  Patient is a 79 y.o. male with a PMHx of CAD, who was transferred from [facility] on 06/16/2014 for evaluation of unstable angina.  1. Unstable angina - previous PCI/DES to LAD, RI in 2012 - continue iv Heparin, ASA, pravastatin, losartan, metoprolol - left cardiac cath in the am  2. Hypertension - borderline, add imdur 30 mg po daily  3. Hyperlipidemia - based on cath results he might require moderate to high dose of high potency statin  DVT PPX - iv Heparin drip   Signed, Dorothy Spark, MD, [facility] 06/16/2014, 11:35 AM

## 2014-06-17 ENCOUNTER — Encounter (HOSPITAL_COMMUNITY)
Admission: EM | Disposition: A | Discharge status: Home / Self Care | Payer: Self-pay | Source: Home / Self Care | Attending: Cardiology

## 2014-06-17 DIAGNOSIS — I25110 Atherosclerotic heart disease of native coronary artery with unstable angina pectoris: Secondary | ICD-10-CM

## 2014-06-17 HISTORY — PX: CORONARY ANGIOPLASTY: SHX604

## 2014-06-17 HISTORY — PX: LEFT HEART CATHETERIZATION WITH CORONARY ANGIOGRAM: SHX5451

## 2014-06-17 LAB — LIPID PANEL
Cholesterol: 122 mg/dL (ref 0–200)
HDL: 54 mg/dL (ref >39)
LDL Cholesterol: 49 mg/dL (ref 0–99)
Total CHOL/HDL Ratio: 2.3 RATIO
Triglycerides: 96 mg/dL (ref <150)
VLDL: 19 mg/dL (ref 0–40)

## 2014-06-17 LAB — BASIC METABOLIC PANEL
Anion gap: 12 (ref 5–15)
BUN: 14 mg/dL (ref 6–23)
CO2: 24 meq/L (ref 19–32)
Calcium: 8.5 mg/dL (ref 8.4–10.5)
Chloride: 106 mEq/L (ref 96–112)
Creatinine, Ser: 1.21 mg/dL (ref 0.50–1.35)
GFR calc Af Amer: 65 mL/min — ABNORMAL LOW (ref >90)
GFR, EST NON AFRICAN AMERICAN: 56 mL/min — AB (ref >90)
GLUCOSE: 109 mg/dL — AB (ref 70–99)
POTASSIUM: 3.9 meq/L (ref 3.7–5.3)
Sodium: 142 mEq/L (ref 137–147)

## 2014-06-17 LAB — HEPARIN LEVEL (UNFRACTIONATED): Heparin Unfractionated: 0.47 IU/mL (ref 0.30–0.70)

## 2014-06-17 LAB — CBC
HEMATOCRIT: 42.5 % (ref 39.0–52.0)
HEMOGLOBIN: 14.1 g/dL (ref 13.0–17.0)
MCH: 30.3 pg (ref 26.0–34.0)
MCHC: 33.2 g/dL (ref 30.0–36.0)
MCV: 91.4 fL (ref 78.0–100.0)
Platelets: 315 10*3/uL (ref 150–400)
RBC: 4.65 MIL/uL (ref 4.22–5.81)
RDW: 12.6 % (ref 11.5–15.5)
WBC: 15.1 10*3/uL — AB (ref 4.0–10.5)

## 2014-06-17 LAB — POCT ACTIVATED CLOTTING TIME
Activated Clotting Time: 236 seconds
Activated Clotting Time: 287 seconds

## 2014-06-17 LAB — PROTIME-INR
INR: 1.06 (ref 0.00–1.49)
PROTHROMBIN TIME: 13.9 s (ref 11.6–15.2)

## 2014-06-17 SURGERY — LEFT HEART CATHETERIZATION WITH CORONARY ANGIOGRAM
Anesthesia: LOCAL

## 2014-06-17 MED ORDER — FENTANYL CITRATE 0.05 MG/ML IJ SOLN
INTRAMUSCULAR | Status: AC
  Filled 2014-06-17: qty 2

## 2014-06-17 MED ORDER — LIDOCAINE HCL (PF) 1 % IJ SOLN
INTRAMUSCULAR | Status: AC
  Filled 2014-06-17: qty 30

## 2014-06-17 MED ORDER — HEPARIN (PORCINE) IN NACL 2-0.9 UNIT/ML-% IJ SOLN
INTRAMUSCULAR | Status: AC
  Filled 2014-06-17: qty 1500

## 2014-06-17 MED ORDER — NITROGLYCERIN 1 MG/10 ML FOR IR/CATH LAB
INTRA_ARTERIAL | Status: AC
Start: 2014-06-17 — End: 2014-06-17
  Filled 2014-06-17: qty 10

## 2014-06-17 MED ORDER — CLOPIDOGREL BISULFATE 75 MG PO TABS
75.0000 mg | ORAL_TABLET | Freq: Every day | ORAL | Status: DC
  Administered 2014-06-18: 75 mg via ORAL
  Filled 2014-06-17: qty 1

## 2014-06-17 MED ORDER — MIDAZOLAM HCL 2 MG/2ML IJ SOLN
INTRAMUSCULAR | Status: AC
  Filled 2014-06-17: qty 2

## 2014-06-17 MED ORDER — VERAPAMIL HCL 2.5 MG/ML IV SOLN
INTRAVENOUS | Status: AC
  Filled 2014-06-17: qty 2

## 2014-06-17 MED ORDER — HEPARIN SODIUM (PORCINE) 1000 UNIT/ML IJ SOLN
INTRAMUSCULAR | Status: AC
  Filled 2014-06-17: qty 1

## 2014-06-17 MED ORDER — ACTIVE PARTNERSHIP FOR HEALTH OF YOUR HEART BOOK
Freq: Once | Status: AC
  Administered 2014-06-17: 23:00:00
  Filled 2014-06-17: qty 1

## 2014-06-17 MED ORDER — SODIUM CHLORIDE 0.9 % IV SOLN: INTRAVENOUS | Status: AC

## 2014-06-17 MED ORDER — FAMOTIDINE IN NACL 20-0.9 MG/50ML-% IV SOLN
INTRAVENOUS | Status: AC
  Filled 2014-06-17: qty 50

## 2014-06-17 NOTE — H&P (View-Only) (Signed)
SUBJECTIVE:  He did have chest pain last night.  None since then.  No SOB   PHYSICAL EXAM Filed Vitals:   06/16/14 1934 06/16/14 2333 06/17/14 0516 06/17/14 0553  BP: 137/83 125/73 171/87   Pulse: 81 62 73   Temp: 98.9 F (37.2 C)  98 F (36.7 C)   TempSrc: Oral  Oral   Resp:   20   Height:      Weight:    220 lb 1.6 oz (99.837 kg)  SpO2: 99% 99% 99%    General:  No distress Lungs:  Clear Heart:  RRR Abdomen:  Positive bowel sounds, no rebound no guarding Extremities:  No edema   LABS: Lab Results  Component Value Date   TROPONINI <0.30 06/16/2014   Results for orders placed during the hospital encounter of 06/16/14 (from the past 24 hour(s))  CBC     Status: Abnormal   Collection Time    06/16/14  2:06 PM      Result Value Ref Range   WBC 11.8 (*) 4.0 - 10.5 K/uL   RBC 4.49  4.22 - 5.81 MIL/uL   Hemoglobin 13.7  13.0 - 17.0 g/dL   HCT 40.8  39.0 - 52.0 %   MCV 90.9  78.0 - 100.0 fL   MCH 30.5  26.0 - 34.0 pg   MCHC 33.6  30.0 - 36.0 g/dL   RDW 12.4  11.5 - 15.5 %   Platelets 306  150 - 400 K/uL  PROTIME-INR     Status: None   Collection Time    06/16/14  2:06 PM      Result Value Ref Range   Prothrombin Time 14.8  11.6 - 15.2 seconds   INR 1.14  0.00 - 1.49  COMPREHENSIVE METABOLIC PANEL     Status: Abnormal   Collection Time    06/16/14  2:06 PM      Result Value Ref Range   Sodium 139  137 - 147 mEq/L   Potassium 4.1  3.7 - 5.3 mEq/L   Chloride 104  96 - 112 mEq/L   CO2 24  19 - 32 mEq/L   Glucose, Bld 130 (*) 70 - 99 mg/dL   BUN 15  6 - 23 mg/dL   Creatinine, Ser 1.13  0.50 - 1.35 mg/dL   Calcium 8.8  8.4 - 10.5 mg/dL   Total Protein 6.5  6.0 - 8.3 g/dL   Albumin 3.4 (*) 3.5 - 5.2 g/dL   AST 20  0 - 37 U/L   ALT 17  0 - 53 U/L   Alkaline Phosphatase 60  39 - 117 U/L   Total Bilirubin 0.6  0.3 - 1.2 mg/dL   GFR calc non Af Amer 61 (*) >90 mL/min   GFR calc Af Amer 70 (*) >90 mL/min   Anion gap 11  5 - 15  LIPID PANEL     Status: None   Collection Time    06/16/14  2:06 PM      Result Value Ref Range   Cholesterol 122  0 - 200 mg/dL   Triglycerides 113  <150 mg/dL   HDL 55  >39 mg/dL   Total CHOL/HDL Ratio 2.2     VLDL 23  0 - 40 mg/dL   LDL Cholesterol 44  0 - 99 mg/dL  HEPARIN LEVEL (UNFRACTIONATED)     Status: None   Collection Time    06/16/14  6:00 PM  Result Value Ref Range   Heparin Unfractionated 0.47  0.30 - 0.70 IU/mL  HEPARIN LEVEL (UNFRACTIONATED)     Status: None   Collection Time    06/17/14  5:03 AM      Result Value Ref Range   Heparin Unfractionated 0.47  0.30 - 0.70 IU/mL  CBC     Status: Abnormal   Collection Time    06/17/14  5:03 AM      Result Value Ref Range   WBC 15.1 (*) 4.0 - 10.5 K/uL   RBC 4.65  4.22 - 5.81 MIL/uL   Hemoglobin 14.1  13.0 - 17.0 g/dL   HCT 42.5  39.0 - 52.0 %   MCV 91.4  78.0 - 100.0 fL   MCH 30.3  26.0 - 34.0 pg   MCHC 33.2  30.0 - 36.0 g/dL   RDW 12.6  11.5 - 15.5 %   Platelets 315  150 - 400 K/uL  BASIC METABOLIC PANEL     Status: Abnormal   Collection Time    06/17/14  5:03 AM      Result Value Ref Range   Sodium 142  137 - 147 mEq/L   Potassium 3.9  3.7 - 5.3 mEq/L   Chloride 106  96 - 112 mEq/L   CO2 24  19 - 32 mEq/L   Glucose, Bld 109 (*) 70 - 99 mg/dL   BUN 14  6 - 23 mg/dL   Creatinine, Ser 1.21  0.50 - 1.35 mg/dL   Calcium 8.5  8.4 - 10.5 mg/dL   GFR calc non Af Amer 56 (*) >90 mL/min   GFR calc Af Amer 65 (*) >90 mL/min   Anion gap 12  5 - 15  PROTIME-INR     Status: None   Collection Time    06/17/14  5:03 AM      Result Value Ref Range   Prothrombin Time 13.9  11.6 - 15.2 seconds   INR 1.06  0.00 - 1.49  LIPID PANEL     Status: None   Collection Time    06/17/14  5:03 AM      Result Value Ref Range   Cholesterol 122  0 - 200 mg/dL   Triglycerides 96  <150 mg/dL   HDL 54  >39 mg/dL   Total CHOL/HDL Ratio 2.3     VLDL 19  0 - 40 mg/dL   LDL Cholesterol 49  0 - 99 mg/dL    Intake/Output Summary (Last 24 hours) at 06/17/14  0834 Last data filed at 06/17/14 0700  Gross per 24 hour  Intake  712.5 ml  Output      0 ml  Net  712.5 ml    ASSESSMENT AND PLAN:  UNSTABLE ANGINA:  Cath today.  Continue IV heparin for now.  Enzymes negative.  No EKG this AM.   I reviewed the EKG that was done last night at the time of the chest pain and there were no acute ST T wave changes.   Telemetry reviewed today.  Rare PVCs  DYSLIPIDEMIA:  LDL is 49.   Continue current therapy.    Jeneen Rinks [facility] 06/17/2014 8:34 AM

## 2014-06-17 NOTE — Progress Notes (Signed)
SUBJECTIVE:  He did have chest pain last night.  None since then.  No SOB   PHYSICAL EXAM Filed Vitals:   06/16/14 1934 06/16/14 2333 06/17/14 0516 06/17/14 0553  BP: 137/83 125/73 171/87   Pulse: 81 62 73   Temp: 98.9 F (37.2 C)  98 F (36.7 C)   TempSrc: Oral  Oral   Resp:   20   Height:      Weight:    220 lb 1.6 oz (99.837 kg)  SpO2: 99% 99% 99%    General:  No distress Lungs:  Clear Heart:  RRR Abdomen:  Positive bowel sounds, no rebound no guarding Extremities:  No edema   LABS: Lab Results  Component Value Date   TROPONINI <0.30 06/16/2014   Results for orders placed during the hospital encounter of 06/16/14 (from the past 24 hour(s))  CBC     Status: Abnormal   Collection Time    06/16/14  2:06 PM      Result Value Ref Range   WBC 11.8 (*) 4.0 - 10.5 K/uL   RBC 4.49  4.22 - 5.81 MIL/uL   Hemoglobin 13.7  13.0 - 17.0 g/dL   HCT 40.8  39.0 - 52.0 %   MCV 90.9  78.0 - 100.0 fL   MCH 30.5  26.0 - 34.0 pg   MCHC 33.6  30.0 - 36.0 g/dL   RDW 12.4  11.5 - 15.5 %   Platelets 306  150 - 400 K/uL  PROTIME-INR     Status: None   Collection Time    06/16/14  2:06 PM      Result Value Ref Range   Prothrombin Time 14.8  11.6 - 15.2 seconds   INR 1.14  0.00 - 1.49  COMPREHENSIVE METABOLIC PANEL     Status: Abnormal   Collection Time    06/16/14  2:06 PM      Result Value Ref Range   Sodium 139  137 - 147 mEq/L   Potassium 4.1  3.7 - 5.3 mEq/L   Chloride 104  96 - 112 mEq/L   CO2 24  19 - 32 mEq/L   Glucose, Bld 130 (*) 70 - 99 mg/dL   BUN 15  6 - 23 mg/dL   Creatinine, Ser 1.13  0.50 - 1.35 mg/dL   Calcium 8.8  8.4 - 10.5 mg/dL   Total Protein 6.5  6.0 - 8.3 g/dL   Albumin 3.4 (*) 3.5 - 5.2 g/dL   AST 20  0 - 37 U/L   ALT 17  0 - 53 U/L   Alkaline Phosphatase 60  39 - 117 U/L   Total Bilirubin 0.6  0.3 - 1.2 mg/dL   GFR calc non Af Amer 61 (*) >90 mL/min   GFR calc Af Amer 70 (*) >90 mL/min   Anion gap 11  5 - 15  LIPID PANEL     Status: None   Collection Time    06/16/14  2:06 PM      Result Value Ref Range   Cholesterol 122  0 - 200 mg/dL   Triglycerides 113  <150 mg/dL   HDL 55  >39 mg/dL   Total CHOL/HDL Ratio 2.2     VLDL 23  0 - 40 mg/dL   LDL Cholesterol 44  0 - 99 mg/dL  HEPARIN LEVEL (UNFRACTIONATED)     Status: None   Collection Time    06/16/14  6:00 PM  Result Value Ref Range   Heparin Unfractionated 0.47  0.30 - 0.70 IU/mL  HEPARIN LEVEL (UNFRACTIONATED)     Status: None   Collection Time    06/17/14  5:03 AM      Result Value Ref Range   Heparin Unfractionated 0.47  0.30 - 0.70 IU/mL  CBC     Status: Abnormal   Collection Time    06/17/14  5:03 AM      Result Value Ref Range   WBC 15.1 (*) 4.0 - 10.5 K/uL   RBC 4.65  4.22 - 5.81 MIL/uL   Hemoglobin 14.1  13.0 - 17.0 g/dL   HCT 42.5  39.0 - 52.0 %   MCV 91.4  78.0 - 100.0 fL   MCH 30.3  26.0 - 34.0 pg   MCHC 33.2  30.0 - 36.0 g/dL   RDW 12.6  11.5 - 15.5 %   Platelets 315  150 - 400 K/uL  BASIC METABOLIC PANEL     Status: Abnormal   Collection Time    06/17/14  5:03 AM      Result Value Ref Range   Sodium 142  137 - 147 mEq/L   Potassium 3.9  3.7 - 5.3 mEq/L   Chloride 106  96 - 112 mEq/L   CO2 24  19 - 32 mEq/L   Glucose, Bld 109 (*) 70 - 99 mg/dL   BUN 14  6 - 23 mg/dL   Creatinine, Ser 1.21  0.50 - 1.35 mg/dL   Calcium 8.5  8.4 - 10.5 mg/dL   GFR calc non Af Amer 56 (*) >90 mL/min   GFR calc Af Amer 65 (*) >90 mL/min   Anion gap 12  5 - 15  PROTIME-INR     Status: None   Collection Time    06/17/14  5:03 AM      Result Value Ref Range   Prothrombin Time 13.9  11.6 - 15.2 seconds   INR 1.06  0.00 - 1.49  LIPID PANEL     Status: None   Collection Time    06/17/14  5:03 AM      Result Value Ref Range   Cholesterol 122  0 - 200 mg/dL   Triglycerides 96  <150 mg/dL   HDL 54  >39 mg/dL   Total CHOL/HDL Ratio 2.3     VLDL 19  0 - 40 mg/dL   LDL Cholesterol 49  0 - 99 mg/dL    Intake/Output Summary (Last 24 hours) at 06/17/14  0834 Last data filed at 06/17/14 0700  Gross per 24 hour  Intake  712.5 ml  Output      0 ml  Net  712.5 ml    ASSESSMENT AND PLAN:  UNSTABLE ANGINA:  Cath today.  Continue IV heparin for now.  Enzymes negative.  No EKG this AM.   I reviewed the EKG that was done last night at the time of the chest pain and there were no acute ST T wave changes.   Telemetry reviewed today.  Rare PVCs  DYSLIPIDEMIA:  LDL is 49.   Continue current therapy.    Jeneen Rinks [facility] 06/17/2014 8:34 AM

## 2014-06-17 NOTE — Care Management Note (Addendum)
  Page 1 of 1   06/18/2014     12:41:14 PM CARE MANAGEMENT NOTE 06/18/2014  Patient:  David Pugh, David Pugh   Account Number:  [id number]  Date Initiated:  06/17/2014  Documentation initiated by:  GRAVES-BIGELOW,BRENDA  Subjective/Objective Assessment:   Pt admitted for unstable angina. Initiated on IV heparin gtt. Plan for cath.     Action/Plan:   CM will continue to monitor for disposition needs.   Anticipated DC Date:  06/19/2014   Anticipated DC Plan:  [place]  CM consult      Choice offered to / List presented to:             Status of service:  Completed, signed off Medicare Important Message given?  NA - LOS <3 / Initial given by admissions (If response is "NO", the following Medicare IM given date fields will be blank) Date Medicare IM given:   Medicare IM given by:   Date Additional Medicare IM given:   Additional Medicare IM given by:    Discharge Disposition:  HOME/SELF CARE  Per UR Regulation:  Reviewed for med. necessity/level of care/duration of stay  If discussed at [place] of Stay Meetings, dates discussed:    Comments:  Iyona Pehrson RN, BSN, MSHL, CCM  Nurse - Case Manager,  (Unit [phone]  06/18/2014 Med Review:  [facility]

## 2014-06-17 NOTE — Interval H&P Note (Signed)
History and Physical Interval Note:  06/17/2014 4:13 PM  Ardis Hughs  has presented today for cardiac cath with the diagnosis of unstable angina  The various methods of treatment have been discussed with the patient and family. After consideration of risks, benefits and other options for treatment, the patient has consented to  Procedure(s): LEFT HEART CATHETERIZATION WITH CORONARY ANGIOGRAM (N/A) as a surgical intervention .  The patient's history has been reviewed, patient examined, no change in status, stable for surgery.  I have reviewed the patient's chart and labs.  Questions were answered to the patient's satisfaction.    Cath Lab Visit (complete for each Cath Lab visit)  Clinical Evaluation Leading to the Procedure:   ACS: No.  Non-ACS:    Anginal Classification: CCS III  Anti-ischemic medical therapy: Minimal Therapy (1 class of medications)  Non-Invasive Test Results: No non-invasive testing performed  Prior CABG: No previous CABG        MCALHANY,CHRISTOPHER

## 2014-06-17 NOTE — Progress Notes (Signed)
UR Completed Amadi Yoshino Graves-Bigelow, RN,BSN [phone]  

## 2014-06-17 NOTE — CV Procedure (Signed)
Cardiac Catheterization Operative Report  David Pugh [id number] 10/19/20156:05 PM [name],[name], MD  Procedure Performed:  1. Left Heart Catheterization 2. Selective Coronary Angiography 3. PTCA with balloon angioplasty only of the first obtuse marginal branch  Operator: Lauree Chandler, MD  Arterial access site:  Right radial artery.   Indication:   79 yo male with history of CAD with prior PCI of the LAD and Intermediate in 2012 with DES placed in both branches at that time. Now admitted with unstable angina.                                    Procedure Details: The risks, benefits, complications, treatment options, and expected outcomes were discussed with the patient. The patient and/or family concurred with the proposed plan, giving informed consent. The patient was brought to the cath lab after IV hydration was begun and oral premedication was given. The patient was further sedated with Versed and Fentanyl. The right wrist was assessed with a modified Allens test which was positive. The right wrist was prepped and draped in a sterile fashion. 1% lidocaine was used for local anesthesia. Using the modified Seldinger access technique, a 5 French sheath was placed in the right radial artery. 3 mg Verapamil was given through the sheath. 5000 units IV heparin was given. I engaged the left main with a JL3.5 diagnostic catheter and the RCA with a TIG 4.0 catheter. Selective coronary angiography was performed. A pigtail catheter was used to cross the aortic valve and pressures were measures. He was found to have severe disease in the first OM branch. I elected to proceed to PCI of the first OM branch.   PCI Note: He was given an additional 6000 units IV heparin. ACT was 235. He was then given an additional 3000 units IV heparin. ACT was over 280. I then engaged the left main with a XB 3.0 guiding catheter. I passed a Cougar IC wire down the Circumflex into the OM1. I then  dilated the long segment of stenosis in OM1 with a 2.0 x 20 mm balloon x 2. I was unable to deliver a small stent despite attempting this with a buddy wire. I used a FineCross catheter to shuttle in a long Mailman wire but still could not deliver the stent. I was unable to successfully deliver a Guideliner for guide support. The case was terminated with an acceptable balloon angioplasty result. The stenosis was taken from 99% down to 30%. There was excellent flow down the vessel.   The sheath was removed from the right radial artery and a Terumo hemostasis band was applied at the arteriotomy site on the right wrist.    There were no immediate complications. The patient was taken to the recovery area in stable condition.   Hemodynamic Findings: Central aortic pressure: 125/76 Left ventricular pressure: 127/14/17  Angiographic Findings:  Left main: No obstructive disease.   Left Anterior Descending Artery: Moderate to large caliber vessel that courses to the apex. The proximal vessel has mild disease. The mid vessel has diffuse 30% stenosis followed by a patent stented segment with no restenosis. The apical LAD becomes very small in caliber with diffuse 80% stenosis. Small caliber bifurcating diagonal branch with diffuse 30% stenosis.   Circumflex Artery: Small to moderate caliber diffusely diseased vessel with proximal 30% stenosis. There is a small caliber (2.0) obtuse marginal branch with proximal 50%  stenosis followed by 99% focal stenosis. The AV groove Circumflex is small in caliber beyond the OM branch and has mild plaque disease.   Intermediate Branch: Moderate caliber vessel with patent proximal stent with minimal restenosis.   Right Coronary Artery: Large dominant vessel with 30% proximal stenosis, diffuse 40% mid stenosis, 50% focal distal stenosis. The posterolateral branch is a small caliber vessel with 99% distal stenosis (too small for PCI).   Left Ventricular Angiogram: Deferred.     Impression: 1. Triple vessel CAD 2. Patent stents in the mid LAD and proximal intermediate branch 3. Severe stenosis in the small caliber OM branch, s/p balloon angioplasty (unable to deliver a stent due to takeoff of Circumflex from left main, size of the vessel and tortuosity)  Recommendations: Medical management with ASA and Plavix. Continue aggressive risk factor reduction. If he has recurrent pain and repeat PCI of the OM is attempted, would use groin approach as this may provide better guide support.        Complications:  None. The patient tolerated the procedure well.

## 2014-06-18 ENCOUNTER — Encounter (HOSPITAL_COMMUNITY): Payer: Self-pay | Admitting: Physician Assistant

## 2014-06-18 LAB — CBC
HCT: 38 % — ABNORMAL LOW (ref 39.0–52.0)
HEMOGLOBIN: 12.4 g/dL — AB (ref 13.0–17.0)
MCH: 30.7 pg (ref 26.0–34.0)
MCHC: 32.6 g/dL (ref 30.0–36.0)
MCV: 94.1 fL (ref 78.0–100.0)
Platelets: 283 10*3/uL (ref 150–400)
RBC: 4.04 MIL/uL — ABNORMAL LOW (ref 4.22–5.81)
RDW: 12.7 % (ref 11.5–15.5)
WBC: 13 10*3/uL — ABNORMAL HIGH (ref 4.0–10.5)

## 2014-06-18 LAB — BASIC METABOLIC PANEL
Anion gap: 11 (ref 5–15)
BUN: 12 mg/dL (ref 6–23)
CALCIUM: 8.4 mg/dL (ref 8.4–10.5)
CO2: 24 mEq/L (ref 19–32)
Chloride: 105 mEq/L (ref 96–112)
Creatinine, Ser: 1.24 mg/dL (ref 0.50–1.35)
GFR calc Af Amer: 63 mL/min — ABNORMAL LOW (ref >90)
GFR, EST NON AFRICAN AMERICAN: 54 mL/min — AB (ref >90)
GLUCOSE: 99 mg/dL (ref 70–99)
Potassium: 4.2 mEq/L (ref 3.7–5.3)
SODIUM: 140 meq/L (ref 137–147)

## 2014-06-18 MED ORDER — ISOSORBIDE MONONITRATE ER 30 MG PO TB24: 30.0000 mg | ORAL_TABLET | Freq: Every day | ORAL | Status: DC

## 2014-06-18 MED ORDER — PANTOPRAZOLE SODIUM 40 MG PO TBEC: 80.0000 mg | DELAYED_RELEASE_TABLET | Freq: Every day | ORAL | Status: DC

## 2014-06-18 MED ORDER — CLOPIDOGREL BISULFATE 75 MG PO TABS: 75.0000 mg | ORAL_TABLET | Freq: Every day | ORAL | Status: DC

## 2014-06-18 NOTE — Discharge Summary (Signed)
Discharge Summary   Patient ID: David Pugh,  MRN: [id number], DOB/AGE: 07-Oct-1934 79 y.o.  Admit date: 06/16/2014 Discharge date: 06/18/2014  Primary Care Provider: [name],[name] Primary Cardiologist: Dr Sallyanne Kuster  Discharge Diagnoses Principal Problem:   Unstable angina Active Problems:   GERD   CAD (coronary artery disease)   HTN (hypertension)   Dyslipidemia   Allergies Allergies  Allergen Reactions  . Shellfish Allergy Anaphylaxis  . Sulfa Antibiotics Diarrhea and Nausea Only  . Sulfonamide Derivatives Diarrhea and Nausea Only    Procedures  Cardiac catheterization 06/17/2014 Hemodynamic Findings:  Central aortic pressure: 125/76  Left ventricular pressure: 127/14/17  Angiographic Findings:  Left main: No obstructive disease.  Left Anterior Descending Artery: Moderate to large caliber vessel that courses to the apex. The proximal vessel has mild disease. The mid vessel has diffuse 30% stenosis followed by a patent stented segment with no restenosis. The apical LAD becomes very small in caliber with diffuse 80% stenosis. Small caliber bifurcating diagonal branch with diffuse 30% stenosis.  Circumflex Artery: Small to moderate caliber diffusely diseased vessel with proximal 30% stenosis. There is a small caliber (2.0) obtuse marginal branch with proximal 50% stenosis followed by 99% focal stenosis. The AV groove Circumflex is small in caliber beyond the OM branch and has mild plaque disease.  Intermediate Branch: Moderate caliber vessel with patent proximal stent with minimal restenosis.  Right Coronary Artery: Large dominant vessel with 30% proximal stenosis, diffuse 40% mid stenosis, 50% focal distal stenosis. The posterolateral branch is a small caliber vessel with 99% distal stenosis (too small for PCI).  Left Ventricular Angiogram: Deferred.  Impression:  1. Triple vessel CAD  2. Patent stents in the mid LAD and proximal intermediate branch  3. Severe  stenosis in the small caliber OM branch, s/p balloon angioplasty (unable to deliver a stent due to takeoff of Circumflex from left main, size of the vessel and tortuosity)  Recommendations: Medical management with ASA and Plavix. Continue aggressive risk factor reduction. If he has recurrent pain and repeat PCI of the OM is attempted, would use groin approach as this may provide better guide support.  Complications: None. The patient tolerated the procedure well.      Hospital Course  The patient is a 79 year old male with past medical history of coronary artery disease status post drug-eluting stent to ramus/OM and mid LAD in 2012, hypertension, and hyperlipidemia who presented to [facility] 06/16/2014 with intermittent central, nonradiating chest tightness related to exertion and relieved by rest and nitroglycerin. The chest discomfort was similar to his last MI occurring 3 years ago. As the symptom and degree of chest discomfort worsened, prompting the patient to seek medical attention. Initial EKG showed no significant ST-T wave changes.   It was felt the patient likely has unstable angina and he was transferred to [facility] for further evaluation. He was admitted to cardiology service. Serial troponin were negative. He was placed on IV heparin. He underwent cardiac catheterization on 06/17/2014 which showed triple-vessel disease, patent stent in mid LAD and proximal intermediate branch, severe stenosis in the small caliber OM1 branch status post balloon angioplasty. Due to the location of the blockage, it was unable to deliver a stent. He was subsequently placed on medical management with aspirin and Plavix. If he has recurrent chest pain, repeat PCI of the OM can be attempted however will need femoral approach as this may provide better guide support. Post cath, patient tolerated the procedure well. He was seen  in the morning of 06/18/2014, at which time he denies any significant  chest pain or shortness breath. He is deemed stable for discharge from cardiology perspective. Imdur 30 mg was added at discharge to help with antianginal effort. He will need transitional care followup. He has been seen by cardiac rehab prior to discharge.   Discharge Vitals Blood pressure 129/76, pulse 72, temperature 98.1 F (36.7 C), temperature source Oral, resp. rate 18, height 5\' 11"  (1.803 m), weight 220 lb 10.9 oz (100.1 kg), SpO2 94.00%.  Filed Weights   06/16/14 1020 06/17/14 0553 06/18/14 0009  Weight: 218 lb (98.884 kg) 220 lb 1.6 oz (99.837 kg) 220 lb 10.9 oz (100.1 kg)    Labs  CBC  Recent Labs  06/16/14 0715  06/17/14 0503 06/18/14 0408  WBC 12.7*  < > 15.1* 13.0*  NEUTROABS 5.8  --   --   --   HGB 15.3  < > 14.1 12.4*  HCT 44.8  < > 42.5 38.0*  MCV 91.4  < > 91.4 94.1  PLT 349  < > 315 283  < > = values in this interval not displayed. Basic Metabolic Panel  Recent Labs  06/17/14 0503 06/18/14 0408  NA 142 140  K 3.9 4.2  CL 106 105  CO2 24 24  GLUCOSE 109* 99  BUN 14 12  CREATININE 1.21 1.24  CALCIUM 8.5 8.4   Liver Function Tests  Recent Labs  06/16/14 0715 06/16/14 1406  AST 19 20  ALT 16 17  ALKPHOS 69 60  BILITOT 0.9 0.6  PROT 7.1 6.5  ALBUMIN 3.7 3.4*   Cardiac Enzymes  Recent Labs  06/16/14 0715  TROPONINI <0.30   Fasting Lipid Panel  Recent Labs  06/17/14 0503  CHOL 122  HDL 54  LDLCALC 49  TRIG 96  CHOLHDL 2.3    Disposition  Pt is being discharged home today in good condition.  Follow-up Plans & Appointments      Follow-up Information   Follow up with Erlene Quan, PA-C On 07/01/2014. (8:00am)    Specialty:  Cardiology   Contact information:   [address] [place] [address] [phone]       Discharge Medications    Medication List    STOP taking these medications       omeprazole 40 MG capsule  Commonly known as:  PRILOSEC  Replaced by:  pantoprazole 40 MG tablet        TAKE these medications       aspirin EC 81 MG tablet  Take 81 mg by mouth daily.     camphor-menthol lotion  Commonly known as:  SARNA  Apply 1 application topically every evening. For knee     cetirizine 10 MG tablet  Commonly known as:  ZYRTEC  Take 10 mg by mouth daily.     citalopram 10 MG tablet  Commonly known as:  CELEXA  Take 10 mg by mouth every morning.     clopidogrel 75 MG tablet  Commonly known as:  PLAVIX  Take 1 tablet (75 mg total) by mouth daily with breakfast.     fluticasone 50 MCG/ACT nasal spray  Commonly known as:  FLONASE  Place 2 sprays into both nostrils every evening.     folic acid 1 MG tablet  Commonly known as:  FOLVITE  Take 1 mg by mouth once a week. Sunday.     isosorbide mononitrate 30 MG 24 hr tablet  Commonly known as:  IMDUR  Take 1 tablet (30 mg total) by mouth daily.     losartan 50 MG tablet  Commonly known as:  COZAAR  Take 25 mg by mouth 2 (two) times daily.     metoprolol 50 MG tablet  Commonly known as:  LOPRESSOR  Take 50 mg by mouth 2 (two) times daily.     multivitamin with minerals Tabs tablet  Take 1 tablet by mouth daily.     nitroGLYCERIN 0.4 MG SL tablet  Commonly known as:  NITROSTAT  Place 0.4 mg under the tongue every 5 (five) minutes x 3 doses as needed. For chest pain     pantoprazole 40 MG tablet  Commonly known as:  PROTONIX  Take 2 tablets (80 mg total) by mouth daily.     pravastatin 40 MG tablet  Commonly known as:  PRAVACHOL  Take 40 mg by mouth at bedtime.     PROBIOTIC DAILY PO  Take 1 tablet by mouth daily.     VITAMIN B COMPLEX PO  Take 1 tablet by mouth daily.     XALATAN 0.005 % ophthalmic solution  Generic drug:  latanoprost  Place 1 drop into both eyes at bedtime.        Duration of Discharge Encounter   Greater than 30 minutes including physician time.  Hilbert Corrigan PA-C Pager: [phone] 06/18/2014, 10:17 AM  Patient seen and examined.  Plan as discussed in my  rounding note for today and outlined above. Jeneen Rinks [facility]  06/18/2014  12:23 PM

## 2014-06-18 NOTE — Progress Notes (Signed)
    SUBJECTIVE:  No further chest pain.  No SOB   PHYSICAL EXAM Filed Vitals:   06/17/14 2100 06/18/14 0009 06/18/14 0512 06/18/14 0740  BP: 114/58 118/62 108/62 129/76  Pulse: 63 60 64 72  Temp: 98 F (36.7 C) 98.6 F (37 C) 98.4 F (36.9 C) 98.1 F (36.7 C)  TempSrc: Oral Oral Oral Oral  Resp: 18 18 20 18   Height:      Weight:  220 lb 10.9 oz (100.1 kg)    SpO2: 96% 97% 95% 94%   General:  No distress Lungs:  Clear Heart:  RRR Abdomen:  Positive bowel sounds, no rebound no guarding Extremities:  No edema, right arm intact radial site.  LABS: Lab Results  Component Value Date   TROPONINI <0.30 06/16/2014   Results for orders placed during the hospital encounter of 06/16/14 (from the past 24 hour(s))  POCT ACTIVATED CLOTTING TIME     Status: None   Collection Time    06/17/14  5:00 PM      Result Value Ref Range   Activated Clotting Time 236    POCT ACTIVATED CLOTTING TIME     Status: None   Collection Time    06/17/14  5:23 PM      Result Value Ref Range   Activated Clotting Time 287    CBC     Status: Abnormal   Collection Time    06/18/14  4:08 AM      Result Value Ref Range   WBC 13.0 (*) 4.0 - 10.5 K/uL   RBC 4.04 (*) 4.22 - 5.81 MIL/uL   Hemoglobin 12.4 (*) 13.0 - 17.0 g/dL   HCT 38.0 (*) 39.0 - 52.0 %   MCV 94.1  78.0 - 100.0 fL   MCH 30.7  26.0 - 34.0 pg   MCHC 32.6  30.0 - 36.0 g/dL   RDW 12.7  11.5 - 15.5 %   Platelets 283  150 - 400 K/uL  BASIC METABOLIC PANEL     Status: Abnormal   Collection Time    06/18/14  4:08 AM      Result Value Ref Range   Sodium 140  137 - 147 mEq/L   Potassium 4.2  3.7 - 5.3 mEq/L   Chloride 105  96 - 112 mEq/L   CO2 24  19 - 32 mEq/L   Glucose, Bld 99  70 - 99 mg/dL   BUN 12  6 - 23 mg/dL   Creatinine, Ser 1.24  0.50 - 1.35 mg/dL   Calcium 8.4  8.4 - 10.5 mg/dL   GFR calc non Af Amer 54 (*) >90 mL/min   GFR calc Af Amer 63 (*) >90 mL/min   Anion gap 11  5 - 15    Intake/Output Summary (Last 24 hours) at  06/18/14 0913 Last data filed at 06/18/14 0744  Gross per 24 hour  Intake    595 ml  Output      0 ml  Net    595 ml    ASSESSMENT AND PLAN:  UNSTABLE ANGINA:  Small vessel disease.  Medical management per cath note.  Add Imdur 30 mg at discharge.  Needs TOC follow up.    DYSLIPIDEMIA:  LDL is 49.   Continue current therapy.    Jeneen Rinks [facility] 06/18/2014 9:13 AM

## 2014-06-18 NOTE — Progress Notes (Signed)
CARDIAC REHAB PHASE I   PRE:  Rate/Rhythm: 75 SR  BP:  Supine:   Sitting: 113/63  Standing:    SaO2:   MODE:  Ambulation: 500 ft   POST:  Rate/Rhythm: 85 SR  BP:  Supine:   Sitting: 135/70  Standing:    SaO2:  [id number] Waited for pt's wife for ed. Pt walked 500 ft with steady gait. Tolerated well. No CP. Education completed with pt and wife who voiced understanding. Discussed CRP 2 and pt gave permission to refer to [place] program. He attended once before in [place]. Reviewed NTG use.   Graylon Good, RN BSN  06/18/2014 10:10 AM

## 2014-06-18 NOTE — Progress Notes (Signed)
TR BAND REMOVAL  LOCATION:    right radial  DEFLATED PER PROTOCOL:    Yes.    TIME BAND OFF / DRESSING APPLIED:    22:00   SITE UPON ARRIVAL:    Level 0  SITE AFTER BAND REMOVAL:    Level 0  REVERSE ALLEN'S TEST:     positive  CIRCULATION SENSATION AND MOVEMENT:    Within Normal Limits   Yes.    COMMENTS:

## 2014-06-19 ENCOUNTER — Ambulatory Visit: Payer: Medicare Other | Admitting: Neurology

## 2014-06-20 ENCOUNTER — Telehealth: Payer: Self-pay | Admitting: Cardiology

## 2014-06-20 NOTE — Telephone Encounter (Signed)
TCM phone call. Left message.  Kerin Ransom PA-C 06/20/2014 10:08 AM

## 2014-06-20 NOTE — Telephone Encounter (Signed)
Pt returned call. He is doing well. He will keep f/u apt.   Kerin Ransom PA-C 06/20/2014 10:20 AM

## 2014-06-25 ENCOUNTER — Telehealth: Payer: Self-pay | Admitting: *Deleted

## 2014-06-25 NOTE — Telephone Encounter (Signed)
Signed order for Phase II Cardiac Rehab faxed.

## 2014-07-01 ENCOUNTER — Encounter: Payer: Self-pay | Admitting: Cardiology

## 2014-07-01 ENCOUNTER — Ambulatory Visit (INDEPENDENT_AMBULATORY_CARE_PROVIDER_SITE_OTHER): Payer: Medicare Other | Admitting: Cardiology

## 2014-07-01 VITALS — BP 162/94 | HR 66 | Ht 71.0 in | Wt 220.0 lb

## 2014-07-01 DIAGNOSIS — E785 Hyperlipidemia, unspecified: Secondary | ICD-10-CM

## 2014-07-01 DIAGNOSIS — I200 Unstable angina: Secondary | ICD-10-CM

## 2014-07-01 DIAGNOSIS — I2510 Atherosclerotic heart disease of native coronary artery without angina pectoris: Secondary | ICD-10-CM

## 2014-07-01 DIAGNOSIS — I10 Essential (primary) hypertension: Secondary | ICD-10-CM

## 2014-07-01 DIAGNOSIS — Z77098 Contact with and (suspected) exposure to other hazardous, chiefly nonmedicinal, chemicals: Secondary | ICD-10-CM | POA: Insufficient documentation

## 2014-07-01 DIAGNOSIS — Z9189 Other specified personal risk factors, not elsewhere classified: Secondary | ICD-10-CM

## 2014-07-01 NOTE — Patient Instructions (Signed)
Your physician recommends that you schedule a follow-up appointment in: 6 months with Dr.Croitoru    

## 2014-07-01 NOTE — Assessment & Plan Note (Deleted)
LAD, OM,  DES June 2012. Cath 06/17/14- medical Rx after failed OM PCI.

## 2014-07-01 NOTE — Assessment & Plan Note (Signed)
On partial disability from [place]

## 2014-07-01 NOTE — Assessment & Plan Note (Signed)
Admitted 06/16/14 with [place]. Cath 06/17/14- medical Rx

## 2014-07-01 NOTE — Assessment & Plan Note (Signed)
LAD, RI,  DES June 2012. Cath 06/17/14- OM1 POBA.

## 2014-07-01 NOTE — Assessment & Plan Note (Signed)
LDL 49 on recent lipid panel

## 2014-07-01 NOTE — Assessment & Plan Note (Signed)
Controlled at home. He had not taken his meds yet this am. Repeat B/P by me 142/82

## 2014-07-01 NOTE — Progress Notes (Signed)
07/01/2014 David Pugh   1935-01-19  [id number]  Primary Physicia [name], MD Primary Cardiologist: Dr Sallyanne Kuster  HPI:  79 y/o 3 tour [place] with a history of prior LAD and OM DES June 2012. He presented to [facility] 06/16/14 with [place]. He was transferred to [facility] for further evaluation. He ruled out for an MI. Cath done 06/17/14 revealed patent stents in the LAD and RI as well as a 99% OM1. This was treated successfully with POBA.  The pt has been pain free since discharge. He has not resumed exercising yet (he does 30 minutes daily on elliptical).    Current Outpatient Prescriptions  Medication Sig Dispense Refill  . aspirin EC 81 MG tablet Take 81 mg by mouth daily.    . Pugh Complex Vitamins (VITAMIN Pugh COMPLEX PO) Take 1 tablet by mouth daily.     . camphor-menthol (SARNA) lotion Apply 1 application topically every evening. For knee    . cetirizine (ZYRTEC) 10 MG tablet Take 10 mg by mouth daily.    . citalopram (CELEXA) 10 MG tablet Take 10 mg by mouth every morning.     . clopidogrel (PLAVIX) 75 MG tablet Take 1 tablet (75 mg total) by mouth daily with breakfast. 30 tablet 11  . fluticasone (FLONASE) 50 MCG/ACT nasal spray Place 2 sprays into both nostrils every evening.    . folic acid (FOLVITE) 1 MG tablet Take 1 mg by mouth once a week. Sunday.    . isosorbide mononitrate (IMDUR) 30 MG 24 hr tablet Take 1 tablet (30 mg total) by mouth daily. 90 tablet 3  . latanoprost (XALATAN) 0.005 % ophthalmic solution Place 1 drop into both eyes at bedtime.     Marland Kitchen losartan (COZAAR) 50 MG tablet Take 25 mg by mouth 2 (two) times daily.     . metoprolol (LOPRESSOR) 50 MG tablet Take 50 mg by mouth 2 (two) times daily.     . Multiple Vitamin (MULTIVITAMIN WITH MINERALS) TABS tablet Take 1 tablet by mouth daily.    . nitroGLYCERIN (NITROSTAT) 0.4 MG SL tablet Place 0.4 mg under the tongue every 5 (five) minutes x 3 doses as needed. For chest pain    . pantoprazole (PROTONIX) 40 MG  tablet Take 2 tablets (80 mg total) by mouth daily. 30 tablet 5  . pravastatin (PRAVACHOL) 40 MG tablet Take 40 mg by mouth at bedtime.     . Probiotic Product (PROBIOTIC DAILY PO) Take 1 tablet by mouth daily.      No current facility-administered medications for this visit.    Allergies  Allergen Reactions  . Shellfish Allergy Anaphylaxis  . Sulfa Antibiotics Diarrhea and Nausea Only  . Sulfonamide Derivatives Diarrhea and Nausea Only    History   Social History  . Marital Status: Married    Spouse Name: N/A    Number of Children: 3  . 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     Social History Main Topics  . Smoking status: Never Smoker   . Smokeless tobacco: Never Used  . Alcohol Use: Yes     Comment: 1 cocktail/month  . Drug Use: No  . Sexual Activity: Yes    Birth Control/ Protection: None   Other Topics Concern  . Not on file   Social History Narrative     Review of Systems: Recent sinusitis Rx'd with steroids (Dr Scotty Court) General: negative for chills, fever, night sweats  or weight changes.  Cardiovascular: negative for chest pain, dyspnea on exertion, edema, orthopnea, palpitations, paroxysmal nocturnal dyspnea or shortness of breath Dermatological: negative for rash Respiratory: negative for cough or wheezing Urologic: negative for hematuria Abdominal: negative for nausea, vomiting, diarrhea, bright red blood per rectum, melena, or hematemesis Neurologic: negative for visual changes, syncope, or dizziness All other systems reviewed and are otherwise negative except as noted above.    Blood pressure 162/94, pulse 66, height 5\' 11"  (1.803 m), weight 220 lb (99.791 kg).  General appearance: alert, cooperative and no distress Neck: no carotid bruit and no JVD Lungs: clear to auscultation bilaterally Heart: regular rate and rhythm  EKG NSR NSST  ASSESSMENT AND PLAN:   Unstable angina Admitted 06/16/14 with [place]. Cath  06/17/14- medical Rx  CAD (coronary artery disease) LAD, OM,  DES June 2012. Cath 06/17/14- medical Rx after failed OM PCI.  Dyslipidemia LDL 49 on recent lipid panel  HTN (hypertension) Controlled at home. He had not taken his meds yet this am. 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   Debara Kamphuis KPA-C 07/01/2014 8:20 AM

## 2014-07-04 ENCOUNTER — Other Ambulatory Visit: Payer: Self-pay

## 2014-07-04 MED ORDER — PRAVASTATIN SODIUM 40 MG PO TABS: 40.0000 mg | ORAL_TABLET | Freq: Every day | ORAL | Status: DC

## 2014-07-04 MED ORDER — PANTOPRAZOLE SODIUM 40 MG PO TBEC: 80.0000 mg | DELAYED_RELEASE_TABLET | Freq: Every day | ORAL | Status: DC

## 2014-07-04 NOTE — Telephone Encounter (Signed)
Rx was sent to pharmacy electronically. 

## 2014-07-17 ENCOUNTER — Encounter: Payer: Self-pay | Admitting: Neurology

## 2014-07-18 ENCOUNTER — Encounter (HOSPITAL_COMMUNITY): Payer: Self-pay

## 2014-07-18 ENCOUNTER — Encounter (HOSPITAL_COMMUNITY)
Admission: RE | Admit: 2014-07-18 | Discharge: 2014-07-18 | Disposition: A | Discharge status: Home / Self Care | Payer: Medicare Other | Source: Ambulatory Visit | Attending: Cardiovascular Disease | Admitting: Cardiovascular Disease

## 2014-07-18 VITALS — BP 122/68 | HR 64 | Ht 71.0 in | Wt 216.2 lb

## 2014-07-18 DIAGNOSIS — Z9861 Coronary angioplasty status: Secondary | ICD-10-CM | POA: Insufficient documentation

## 2014-07-18 DIAGNOSIS — Z5189 Encounter for other specified aftercare: Secondary | ICD-10-CM | POA: Insufficient documentation

## 2014-07-18 NOTE — Patient Instructions (Signed)
Pt has finished orientation and is scheduled to start CR on 07/22/14 at 11 am. Pt has been instructed to arrive to class 15 minutes early for scheduled class. Pt has been instructed to wear comfortable clothing and shoes with rubber soles. Pt has been told to take their medications 1 hour prior to coming to class.  If the patient is not going to attend class, he has been instructed to call.

## 2014-07-18 NOTE — Progress Notes (Signed)
Patient referred to CR by Dr. Sallyanne Kuster due recent Balloon Angioplasty Z98.61. During orientation advised patient on arrival and appointment times what to wear, what to do before, during and after exercise. Reviewed attendance and class policy. Talked about inclement weather and class consultation policy. Pt is scheduled to start Cardiac Rehab on 07/22/14 at 11 am. Pt was advised to come to class 5 minutes before class starts. He was also given instructions on meeting with the dietician and attending the Family Structure classes. Pt is eager to get started. Patient was able to complete 6 minute walk test.

## 2014-07-22 ENCOUNTER — Encounter (HOSPITAL_COMMUNITY)
Admission: RE | Admit: 2014-07-22 | Discharge: 2014-07-22 | Disposition: A | Discharge status: Home / Self Care | Payer: Medicare Other | Source: Ambulatory Visit | Attending: Cardiovascular Disease | Admitting: Cardiovascular Disease

## 2014-07-22 DIAGNOSIS — Z5189 Encounter for other specified aftercare: Secondary | ICD-10-CM | POA: Diagnosis present

## 2014-07-22 DIAGNOSIS — Z9861 Coronary angioplasty status: Secondary | ICD-10-CM | POA: Diagnosis not present

## 2014-07-23 ENCOUNTER — Encounter: Payer: Self-pay | Admitting: Neurology

## 2014-07-24 ENCOUNTER — Encounter (HOSPITAL_COMMUNITY)
Admission: RE | Admit: 2014-07-24 | Discharge: 2014-07-24 | Disposition: A | Discharge status: Home / Self Care | Payer: Medicare Other | Source: Ambulatory Visit | Attending: Cardiovascular Disease | Admitting: Cardiovascular Disease

## 2014-07-24 DIAGNOSIS — Z5189 Encounter for other specified aftercare: Secondary | ICD-10-CM | POA: Diagnosis not present

## 2014-07-26 ENCOUNTER — Encounter (HOSPITAL_COMMUNITY): Payer: Medicare Other

## 2014-07-29 ENCOUNTER — Encounter (HOSPITAL_COMMUNITY)
Admission: RE | Admit: 2014-07-29 | Discharge: 2014-07-29 | Disposition: A | Discharge status: Home / Self Care | Payer: Medicare Other | Source: Ambulatory Visit | Attending: Cardiovascular Disease | Admitting: Cardiovascular Disease

## 2014-07-29 DIAGNOSIS — Z5189 Encounter for other specified aftercare: Secondary | ICD-10-CM | POA: Diagnosis not present

## 2014-07-29 NOTE — Progress Notes (Signed)
Cardiac Rehabilitation Program Outcomes Report   Orientation:  07/18/14 Graduate Date:  tbd  Discharge Date:  tbd # of sessions completed: 3  Cardiologist: Dr. Sallyanne Kuster Family MD:  Dr. Lorette Ang Time:  1100  A.  Exercise Program:  Tolerates exercise @ 3.68 METS for 15 minutes  B.  Mental Health:  Good mental attitude  C.  Education/Instruction/Skills  Accurately checks own pulse.  Rest:  60  Exercise:  101, Knows THR for exercise and Uses Perceived Exertion Scale and/or Dyspnea Scale    D.  Nutrition/Weight Control/Body Composition:  Adherence to prescribed nutrition program: good    E.  Blood Lipids    Lab Results  Component Value Date   CHOL 122 06/17/2014   HDL 54 06/17/2014   LDLCALC 49 06/17/2014   TRIG 96 06/17/2014   CHOLHDL 2.3 06/17/2014    F.  Lifestyle Changes:  Making positive lifestyle changes  G.  Symptoms noted with exercise:  Asymptomatic  Report Completed By:  Felicity Coyer, RN   Comments:  First week note

## 2014-07-30 ENCOUNTER — Encounter (HOSPITAL_COMMUNITY): Payer: Medicare Other

## 2014-07-31 ENCOUNTER — Encounter (HOSPITAL_COMMUNITY)
Admission: RE | Admit: 2014-07-31 | Discharge: 2014-07-31 | Disposition: A | Discharge status: Home / Self Care | Payer: Medicare Other | Source: Ambulatory Visit | Attending: Cardiovascular Disease | Admitting: Cardiovascular Disease

## 2014-07-31 DIAGNOSIS — Z5189 Encounter for other specified aftercare: Secondary | ICD-10-CM | POA: Diagnosis present

## 2014-07-31 DIAGNOSIS — Z9861 Coronary angioplasty status: Secondary | ICD-10-CM | POA: Diagnosis not present

## 2014-08-02 ENCOUNTER — Encounter (HOSPITAL_COMMUNITY)
Admission: RE | Admit: 2014-08-02 | Discharge: 2014-08-02 | Disposition: A | Discharge status: Home / Self Care | Payer: Medicare Other | Source: Ambulatory Visit | Attending: Cardiovascular Disease | Admitting: Cardiovascular Disease

## 2014-08-02 DIAGNOSIS — Z5189 Encounter for other specified aftercare: Secondary | ICD-10-CM | POA: Diagnosis not present

## 2014-08-05 ENCOUNTER — Encounter (HOSPITAL_COMMUNITY)
Admission: RE | Admit: 2014-08-05 | Discharge: 2014-08-05 | Disposition: A | Discharge status: Home / Self Care | Payer: Medicare Other | Source: Ambulatory Visit | Attending: Cardiovascular Disease | Admitting: Cardiovascular Disease

## 2014-08-05 DIAGNOSIS — Z5189 Encounter for other specified aftercare: Secondary | ICD-10-CM | POA: Diagnosis not present

## 2014-08-07 ENCOUNTER — Encounter (HOSPITAL_COMMUNITY)
Admission: RE | Admit: 2014-08-07 | Discharge: 2014-08-07 | Disposition: A | Discharge status: Home / Self Care | Payer: Medicare Other | Source: Ambulatory Visit | Attending: Cardiovascular Disease | Admitting: Cardiovascular Disease

## 2014-08-07 DIAGNOSIS — Z5189 Encounter for other specified aftercare: Secondary | ICD-10-CM | POA: Diagnosis not present

## 2014-08-08 ENCOUNTER — Encounter (HOSPITAL_COMMUNITY): Payer: Self-pay | Admitting: Cardiovascular Disease

## 2014-08-09 ENCOUNTER — Encounter (HOSPITAL_COMMUNITY)
Admission: RE | Admit: 2014-08-09 | Discharge: 2014-08-09 | Disposition: A | Discharge status: Home / Self Care | Payer: Medicare Other | Source: Ambulatory Visit | Attending: Cardiovascular Disease | Admitting: Cardiovascular Disease

## 2014-08-09 DIAGNOSIS — Z5189 Encounter for other specified aftercare: Secondary | ICD-10-CM | POA: Diagnosis not present

## 2014-08-12 ENCOUNTER — Encounter (HOSPITAL_COMMUNITY)
Admission: RE | Admit: 2014-08-12 | Discharge: 2014-08-12 | Disposition: A | Discharge status: Home / Self Care | Payer: Medicare Other | Source: Ambulatory Visit | Attending: Cardiovascular Disease | Admitting: Cardiovascular Disease

## 2014-08-12 DIAGNOSIS — Z5189 Encounter for other specified aftercare: Secondary | ICD-10-CM | POA: Diagnosis not present

## 2014-08-14 ENCOUNTER — Encounter (HOSPITAL_COMMUNITY)
Admission: RE | Admit: 2014-08-14 | Discharge: 2014-08-14 | Disposition: A | Discharge status: Home / Self Care | Payer: Medicare Other | Source: Ambulatory Visit | Attending: Cardiovascular Disease | Admitting: Cardiovascular Disease

## 2014-08-14 DIAGNOSIS — Z5189 Encounter for other specified aftercare: Secondary | ICD-10-CM | POA: Diagnosis not present

## 2014-08-16 ENCOUNTER — Encounter (HOSPITAL_COMMUNITY)
Admission: RE | Admit: 2014-08-16 | Discharge: 2014-08-16 | Disposition: A | Discharge status: Home / Self Care | Payer: Medicare Other | Source: Ambulatory Visit | Attending: Cardiovascular Disease | Admitting: Cardiovascular Disease

## 2014-08-16 DIAGNOSIS — Z5189 Encounter for other specified aftercare: Secondary | ICD-10-CM | POA: Diagnosis not present

## 2014-08-19 ENCOUNTER — Encounter (HOSPITAL_COMMUNITY)
Admission: RE | Admit: 2014-08-19 | Discharge: 2014-08-19 | Disposition: A | Discharge status: Home / Self Care | Payer: Medicare Other | Source: Ambulatory Visit | Attending: Cardiovascular Disease | Admitting: Cardiovascular Disease

## 2014-08-19 DIAGNOSIS — K627 Radiation proctitis: Secondary | ICD-10-CM | POA: Insufficient documentation

## 2014-08-19 DIAGNOSIS — Z5189 Encounter for other specified aftercare: Secondary | ICD-10-CM | POA: Diagnosis not present

## 2014-08-19 NOTE — Progress Notes (Signed)
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 R hemicolectomy. Inability to tolerate adjuvant XELODA 02/29/2012 History of prostate cancer. TURP X 2, XRT at [facility] in 2011  CURRENT THERAPY: Observation  INTERVAL HISTORY: David Pugh 79 y.o. male with a history of CAD, Stage III CRC, prostate cancer who is doing well. He is here today for regular follow-up. He has recently been started on flomax and reports urinary frequency. He is up to date with his screening colonoscopy.    Past Medical History  Diagnosis Date  . Prostate cancer 2010    s/p radiation, surgery  . Radiation Feb 2011    treatment  . Biliary dyskinesia   . GERD (gastroesophageal reflux disease)   . Hiatal hernia 2010    tiny  . HTN (hypertension)   . Rosacea   . PTSD (post-traumatic stress disorder)   . Diverticula of colon 2010  . Colon polyps     tubular adenomas  . Arthritis   . Exposure to [employer]   . CAD (coronary artery disease) 2012    a. hx of stent x2 in ramus/OM and mid LAD in 2012  b. balloon angioplaty of OM  . Peripheral neuropathy   . Anemia     hx of anemia as a child   . Colon cancer 12/2011    s/p right hemicolectomy, did not tolerate chemo  . Iron deficiency 05/02/2012    followed by Dr. Tressie [name], iron infusions.   . Folic acid deficiency   . Heart palpitations 10/2012  . Gastroparesis 04/12/2013    borderline  . Myocardial infarction 2012  . Glaucoma   . Abnormality of gait 12/18/2013  . Radiation proctitis     has PREMATURE VENTRICULAR CONTRACTIONS; GERD; Cancer of ascending colon; Iron deficiency; Hx of adenomatous colonic polyps; Folic acid deficiency; Peripheral neuropathy; CAD (coronary artery disease); HTN (hypertension); Dyslipidemia; Viral gastroenteritis; Abnormality of gait; Shingles outbreak, right anterior thoracic; Unstable angina; H/O agent Orange exposure; Radiation proctitis; and H/O prostate cancer on  his problem list.      Cancer of ascending colon   02/29/2012 Procedure laparoscopic assisted R colectomy   03/09/2012 Miscellaneous Iron deficiency and folic acid deficiency   03/29/2012 Initial Diagnosis Cancer of ascending colon, CEA on 01/19/2012 was WNL   08/02/2013 Procedure Ileocolonoscopy with snare polypectomy   06/11/2014 Imaging CT abdomen/pelvis with no findings to suggest metastatic disease. Diverticulosis     is allergic to shellfish allergy; sulfa antibiotics; and sulfonamide derivatives.  David Pugh does not currently have medications on file.  Past Surgical History  Procedure Laterality Date  . Cholecystectomy    . Prostatectomy    . Tonsillectomy    . Transurethral resection of prostate    . Esophagogastroduodenoscopy  10/10/2008    Dr. Tilda Burrow hiatal hernia, normal esophagus, normal stomach  . Colonoscopy  02/12/2004    Dr. Gala Romney- L side diverticula, inflammatory  colon polyps  . Esophagogastroduodenoscopy  02/12/2004    Dr. Dudley Major- normal   . Coronary stent placement  01/2011    2.5x22mm Xience drug-eluting stent in ramus intermedius/OMI vessel, 2.5x16mm Promus Element stent in mid LAD artery  . 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 Multiple colonic  polyps removed as described above. Sigmoid Diverticulosis/ 1.5 x 2 cm relatively flat ulcerated lesion in cecum/path showed adenocarcinoma of the colon. descending colon polyp  with tubular adenoma and one fragment of focal high grade dysplasia.   . Knee surgery      left knee   . Partial colectomy  02/29/2012    Procedure: PARTIAL COLECTOMY;  Surgeon: Adin Hector, MD;  Location: [facility] ORS;  Service: General;  Laterality: Right;  . Vasectomy    . Colonoscopy  08/28/2012    LDJ:[id number] proctitis. Colonic diverticulosis/Ulcerations at the surgical anastomosis  path: ulcerated colonic mucosa with prolapse changes, tubular adenoma.   .  Cardiac catheterization  02/13/2011    [facility], [facility] cardiology  . Cataract extraction      2011  . Colonoscopy N/A 08/02/2013    Procedure: COLONOSCOPY;  Surgeon: Daneil Dolin, MD;  Location: [facility] ENDO SUITE;  Service: Endoscopy;  Laterality: N/A;  1200  . Cataract extraction w/phaco Right 08/06/2013    Procedure: CATARACT EXTRACTION PHACO AND INTRAOCULAR LENS PLACEMENT (IOC);  Surgeon: Tonny Branch, MD;  Location: [facility] ORS;  Service: Ophthalmology;  Laterality: Right;  CDE:20.96  . Coronary angioplasty  06/17/14    OM1 POBA  . Left heart catheterization with coronary angiogram N/A 06/17/2014    Procedure: LEFT HEART CATHETERIZATION WITH CORONARY ANGIOGRAM;  Surgeon: Burnell Blanks, MD;  Location: [facility] CATH LAB;  Service: Cardiovascular;  Laterality: N/A;    Review of Systems  Constitutional: Negative.   HENT: Negative.   Eyes: Negative.        Follows routinely with an eye doctor  Respiratory: Negative.   Cardiovascular: Negative.        Just had an angioplasty now on plavix  Gastrointestinal: Negative.   Genitourinary: Positive for urgency and frequency. Negative for dysuria, hematuria and flank pain.  Musculoskeletal:       Knee pain and weakness Has OA in both knees  Skin: Negative.   Neurological: Positive for tingling. Negative for dizziness, tremors, sensory change, speech change, focal weakness and seizures.       BLE neuropathy, worsening and follows with neurology  Endo/Heme/Allergies: Negative.   Psychiatric/Behavioral: Negative.     PHYSICAL EXAMINATION  ECOG PERFORMANCE STATUS: 0 - Asymptomatic  Filed Vitals:   08/20/14 0800  BP: 127/73  Pulse: 73  Temp: 97.8 F (36.6 C)  Resp: 18    Physical Exam  Constitutional: He is oriented to person, place, and time and well-developed, well-nourished, and in no distress. No distress.  HENT:  Head: Normocephalic.  Mouth/Throat: Oropharynx is clear and moist. No oropharyngeal exudate.  Eyes: Conjunctivae and  EOM are normal. Pupils are equal, round, and reactive to light. No scleral icterus.  Neck: Normal range of motion. Neck supple. No JVD present. No tracheal deviation present. No thyromegaly present.  Cardiovascular: Normal rate, regular rhythm and normal heart sounds.   No murmur heard. Pulmonary/Chest: Effort normal and breath sounds normal. No respiratory distress. He has no wheezes. He has no rales. He exhibits no tenderness.  Abdominal: Soft. Bowel sounds are normal. He exhibits no distension and no mass. There is no tenderness. There is no rebound and no guarding.  Musculoskeletal: Normal range of motion. He exhibits no edema.  Lymphadenopathy:    He has no cervical adenopathy.  Neurological: He is alert and oriented to person, place, and time. No cranial nerve deficit. Coordination normal.  Skin: Skin is warm and dry. He is not diaphoretic.  Psychiatric: Mood, memory, affect and judgment normal.    LABORATORY DATA: CBC    Component Value Date/Time   WBC 10.0 08/20/2014 0836   RBC 4.68 08/20/2014  0836   HGB 14.4 08/20/2014 0836   HCT 43.8 08/20/2014 0836   PLT 321 08/20/2014 0836   MCV 93.6 08/20/2014 0836   MCH 30.8 08/20/2014 0836   MCHC 32.9 08/20/2014 0836   RDW 13.0 08/20/2014 0836   LYMPHSABS 3.2 08/20/2014 0836   MONOABS 0.9 08/20/2014 0836   EOSABS 0.5 08/20/2014 0836   BASOSABS 0.0 08/20/2014 0836    CMP     Component Value Date/Time   NA 140 08/20/2014 0836   K 4.1 08/20/2014 0836   CL 110 08/20/2014 0836   CO2 25 08/20/2014 0836   GLUCOSE 132* 08/20/2014 0836   BUN 16 08/20/2014 0836   CREATININE 1.22 08/20/2014 0836   CALCIUM 8.9 08/20/2014 0836   PROT 6.7 08/20/2014 0836   PROT 6.4 12/18/2013 0941   ALBUMIN 3.8 08/20/2014 0836   AST 26 08/20/2014 0836   ALT 20 08/20/2014 0836   ALKPHOS 55 08/20/2014 0836   BILITOT 0.9 08/20/2014 0836   GFRNONAA 55* 08/20/2014 0836   GFRAA 64* 08/20/2014 0836        THERAPY PLAN:  Cancer of ascending  colon Is a pleasant-year-old male with a previously diagnosed stage IIIB colorectal cancer status post laparoscopic right colectomy on 02/29/2012. He attempted adjuvant Xeloda but states he could not tolerate the treatment and therefore opted for no additional therapy. He is up-to-date on screening colonoscopy his last imaging studies were in October he is overall doing well he is active and without major limitations. Laboratory studies were reviewed with the patient today. Blood counts are normal and LFTs are within normal limits. I recommended follow-up in 4 months with repeat laboratory studies, I will keep them apprised the remainder of his labs become available from his visit today. He is to call in the interim with any problems or concerns.   H/O prostate cancer David Pugh has a history of prostate cancer that was treated with definitive radiation. PSA was added to his laboratory studies today. He would like a copy of this and it becomes available. I advised him we will call him with the results and get him a copy of the lab. He does follow regularly with a urologist.    Molli Hazard 08/20/2014

## 2014-08-20 ENCOUNTER — Encounter (HOSPITAL_BASED_OUTPATIENT_CLINIC_OR_DEPARTMENT_OTHER): Payer: Medicare Other | Admitting: Hematology & Oncology

## 2014-08-20 ENCOUNTER — Encounter (HOSPITAL_BASED_OUTPATIENT_CLINIC_OR_DEPARTMENT_OTHER): Payer: Medicare Other

## 2014-08-20 ENCOUNTER — Encounter (HOSPITAL_COMMUNITY): Payer: Self-pay | Admitting: Hematology & Oncology

## 2014-08-20 VITALS — BP 127/73 | HR 73 | Temp 97.8°F | Resp 18 | Wt 224.6 lb

## 2014-08-20 DIAGNOSIS — C182 Malignant neoplasm of ascending colon: Secondary | ICD-10-CM

## 2014-08-20 DIAGNOSIS — K627 Radiation proctitis: Secondary | ICD-10-CM

## 2014-08-20 DIAGNOSIS — Z8546 Personal history of malignant neoplasm of prostate: Secondary | ICD-10-CM

## 2014-08-20 DIAGNOSIS — Z5189 Encounter for other specified aftercare: Secondary | ICD-10-CM | POA: Diagnosis not present

## 2014-08-20 DIAGNOSIS — E611 Iron deficiency: Secondary | ICD-10-CM

## 2014-08-20 DIAGNOSIS — E538 Deficiency of other specified B group vitamins: Secondary | ICD-10-CM

## 2014-08-20 DIAGNOSIS — I10 Essential (primary) hypertension: Secondary | ICD-10-CM

## 2014-08-20 DIAGNOSIS — C61 Malignant neoplasm of prostate: Secondary | ICD-10-CM

## 2014-08-20 DIAGNOSIS — G629 Polyneuropathy, unspecified: Secondary | ICD-10-CM

## 2014-08-20 DIAGNOSIS — R350 Frequency of micturition: Secondary | ICD-10-CM

## 2014-08-20 DIAGNOSIS — D529 Folate deficiency anemia, unspecified: Secondary | ICD-10-CM

## 2014-08-20 LAB — COMPREHENSIVE METABOLIC PANEL
ALK PHOS: 55 U/L (ref 39–117)
ALT: 20 U/L (ref 0–53)
AST: 26 U/L (ref 0–37)
Albumin: 3.8 g/dL (ref 3.5–5.2)
Anion gap: 5 (ref 5–15)
BUN: 16 mg/dL (ref 6–23)
CHLORIDE: 110 meq/L (ref 96–112)
CO2: 25 mmol/L (ref 19–32)
Calcium: 8.9 mg/dL (ref 8.4–10.5)
Creatinine, Ser: 1.22 mg/dL (ref 0.50–1.35)
GFR, EST AFRICAN AMERICAN: 64 mL/min — AB (ref >90)
GFR, EST NON AFRICAN AMERICAN: 55 mL/min — AB (ref >90)
GLUCOSE: 132 mg/dL — AB (ref 70–99)
POTASSIUM: 4.1 mmol/L (ref 3.5–5.1)
SODIUM: 140 mmol/L (ref 135–145)
Total Bilirubin: 0.9 mg/dL (ref 0.3–1.2)
Total Protein: 6.7 g/dL (ref 6.0–8.3)

## 2014-08-20 LAB — CBC WITH DIFFERENTIAL/PLATELET
Basophils Absolute: 0 10*3/uL (ref 0.0–0.1)
Basophils Relative: 0 % (ref 0–1)
Eosinophils Absolute: 0.5 10*3/uL (ref 0.0–0.7)
Eosinophils Relative: 5 % (ref 0–5)
HCT: 43.8 % (ref 39.0–52.0)
HEMOGLOBIN: 14.4 g/dL (ref 13.0–17.0)
Lymphocytes Relative: 32 % (ref 12–46)
Lymphs Abs: 3.2 10*3/uL (ref 0.7–4.0)
MCH: 30.8 pg (ref 26.0–34.0)
MCHC: 32.9 g/dL (ref 30.0–36.0)
MCV: 93.6 fL (ref 78.0–100.0)
MONOS PCT: 9 % (ref 3–12)
Monocytes Absolute: 0.9 10*3/uL (ref 0.1–1.0)
NEUTROS ABS: 5.4 10*3/uL (ref 1.7–7.7)
NEUTROS PCT: 54 % (ref 43–77)
Platelets: 321 10*3/uL (ref 150–400)
RBC: 4.68 MIL/uL (ref 4.22–5.81)
RDW: 13 % (ref 11.5–15.5)
WBC: 10 10*3/uL (ref 4.0–10.5)

## 2014-08-20 LAB — VITAMIN B12: Vitamin B-12: 782 pg/mL (ref 211–911)

## 2014-08-20 LAB — FERRITIN: Ferritin: 36 ng/mL (ref 22–322)

## 2014-08-20 NOTE — Patient Instructions (Signed)
[place] Discharge Instructions  RECOMMENDATIONS MADE BY THE CONSULTANT AND ANY TEST RESULTS WILL BE SENT TO YOUR REFERRING PHYSICIAN.  We will call you with any problems with your labs. I have added a PSA to your next visit. We will see you again in 4 months. Please call with problems or concerns in the interim. Happy Holidays!!   Thank you for choosing [facility] to provide your oncology and hematology care.  To afford each patient quality time with our providers, please arrive at least 15 minutes before your scheduled appointment time.  With your help, our goal is to use those 15 minutes to complete the necessary work-up to ensure our physicians have the information they need to help with your evaluation and healthcare recommendations.    Effective January 1st, 2014, we ask that you re-schedule your appointment with our physicians should you arrive 10 or more minutes late for your appointment.  We strive to give you quality time with our providers, and arriving late affects you and other patients whose appointments are after yours.    Again, thank you for choosing [facility].  Our hope is that these requests will decrease the amount of time that you wait before being seen by our physicians.       _____________________________________________________________  Should you have questions after your visit to [facility], please contact our office at [phone] between the hours of 8:30 a.m. and 5:00 p.m.  Voicemails left after 4:30 p.m. will not be returned until the following business day.  For prescription refill requests, have your pharmacy contact our office with your prescription refill request.

## 2014-08-20 NOTE — Assessment & Plan Note (Signed)
Lynette has a history of prostate cancer that was treated with definitive radiation. PSA was added to his laboratory studies today. He would like a copy of this and it becomes available. I advised him we will call him with the results and get him a copy of the lab. He does follow regularly with a urologist.

## 2014-08-20 NOTE — Assessment & Plan Note (Signed)
Is a pleasant-year-old male with a previously diagnosed stage IIIB colorectal cancer status post laparoscopic right colectomy on 02/29/2012. He attempted adjuvant Xeloda but states he could not tolerate the treatment and therefore opted for no additional therapy. He is up-to-date on screening colonoscopy his last imaging studies were in October he is overall doing well he is active and without major limitations. Laboratory studies were reviewed with the patient today. Blood counts are normal and LFTs are within normal limits. I recommended follow-up in 4 months with repeat laboratory studies, I will keep them apprised the remainder of his labs become available from his visit today. He is to call in the interim with any problems or concerns.

## 2014-08-20 NOTE — Progress Notes (Signed)
LABS FOR CEA,FERR,PSA,CBCD,CMP

## 2014-08-21 ENCOUNTER — Encounter (HOSPITAL_COMMUNITY)
Admission: RE | Admit: 2014-08-21 | Discharge: 2014-08-21 | Disposition: A | Discharge status: Home / Self Care | Payer: Medicare Other | Source: Ambulatory Visit | Attending: Cardiovascular Disease | Admitting: Cardiovascular Disease

## 2014-08-21 DIAGNOSIS — Z5189 Encounter for other specified aftercare: Secondary | ICD-10-CM | POA: Diagnosis not present

## 2014-08-21 LAB — PSA: PSA: 0.34 ng/mL (ref <=4.00)

## 2014-08-21 LAB — CEA: CEA: 1.1 ng/mL (ref 0.0–5.0)

## 2014-08-23 ENCOUNTER — Encounter (HOSPITAL_COMMUNITY): Payer: Medicare Other

## 2014-08-26 ENCOUNTER — Encounter (HOSPITAL_COMMUNITY)
Admission: RE | Admit: 2014-08-26 | Discharge: 2014-08-26 | Disposition: A | Discharge status: Home / Self Care | Payer: Medicare Other | Source: Ambulatory Visit | Attending: Cardiovascular Disease | Admitting: Cardiovascular Disease

## 2014-08-26 DIAGNOSIS — Z5189 Encounter for other specified aftercare: Secondary | ICD-10-CM | POA: Diagnosis not present

## 2014-08-28 ENCOUNTER — Encounter (HOSPITAL_COMMUNITY)
Admission: RE | Admit: 2014-08-28 | Discharge: 2014-08-28 | Disposition: A | Discharge status: Home / Self Care | Payer: Medicare Other | Source: Ambulatory Visit | Attending: Cardiovascular Disease | Admitting: Cardiovascular Disease

## 2014-08-28 DIAGNOSIS — Z5189 Encounter for other specified aftercare: Secondary | ICD-10-CM | POA: Diagnosis not present

## 2014-08-30 ENCOUNTER — Encounter (HOSPITAL_COMMUNITY): Payer: Medicare Other

## 2014-09-02 ENCOUNTER — Encounter (HOSPITAL_COMMUNITY)
Admission: RE | Admit: 2014-09-02 | Discharge: 2014-09-02 | Disposition: A | Discharge status: Home / Self Care | Payer: Medicare Other | Source: Ambulatory Visit | Attending: Cardiovascular Disease | Admitting: Cardiovascular Disease

## 2014-09-02 DIAGNOSIS — Z9861 Coronary angioplasty status: Secondary | ICD-10-CM | POA: Insufficient documentation

## 2014-09-02 DIAGNOSIS — Z5189 Encounter for other specified aftercare: Secondary | ICD-10-CM | POA: Insufficient documentation

## 2014-09-04 ENCOUNTER — Encounter (HOSPITAL_COMMUNITY)
Admission: RE | Admit: 2014-09-04 | Discharge: 2014-09-04 | Disposition: A | Discharge status: Home / Self Care | Payer: Medicare Other | Source: Ambulatory Visit | Attending: Cardiovascular Disease | Admitting: Cardiovascular Disease

## 2014-09-04 DIAGNOSIS — Z5189 Encounter for other specified aftercare: Secondary | ICD-10-CM | POA: Diagnosis not present

## 2014-09-06 ENCOUNTER — Encounter (HOSPITAL_COMMUNITY)
Admission: RE | Admit: 2014-09-06 | Discharge: 2014-09-06 | Disposition: A | Discharge status: Home / Self Care | Payer: Medicare Other | Source: Ambulatory Visit | Attending: Cardiovascular Disease | Admitting: Cardiovascular Disease

## 2014-09-06 DIAGNOSIS — Z5189 Encounter for other specified aftercare: Secondary | ICD-10-CM | POA: Diagnosis not present

## 2014-09-06 NOTE — Progress Notes (Signed)
Cardiac Rehabilitation Program Outcomes Report   Orientation:  07/18/14 Graduate Date:  tbd Discharge Date:  tbd # of sessions completed: 18  Cardiologist: Dr. Sallyanne Kuster Family MD:  Dr. Lorette Ang Time:  1100  A.  Exercise Program:  Tolerates Exercise @ 3.68 METS for 30 minutes.  B.  Mental Health:  Good mental attitude  C.  Education/Instruction/Skills  Accurately checks own pulse.  Rest:  74  Exercise:  101, Knows THR for exercise and Uses Perceived Exertion Scale and/or Dyspnea Scale    D.  Nutrition/Weight Control/Body Composition:  Adherence to prescribed nutrition program: good    E.  Blood Lipids    Lab Results  Component Value Date   CHOL 122 06/17/2014   HDL 54 06/17/2014   LDLCALC 49 06/17/2014   TRIG 96 06/17/2014   CHOLHDL 2.3 06/17/2014    F.  Lifestyle Changes:  Making positive lifestyle changes  G.  Symptoms noted with exercise:  Asymptomatic  Report Completed By:  Felicity Coyer, RN   Comments:  Halfway note.  Pt is progressing very well and displays a very eager and positive attitude.

## 2014-09-09 ENCOUNTER — Encounter (HOSPITAL_COMMUNITY)
Admission: RE | Admit: 2014-09-09 | Discharge: 2014-09-09 | Disposition: A | Discharge status: Home / Self Care | Payer: Medicare Other | Source: Ambulatory Visit | Attending: Cardiovascular Disease | Admitting: Cardiovascular Disease

## 2014-09-09 DIAGNOSIS — Z5189 Encounter for other specified aftercare: Secondary | ICD-10-CM | POA: Diagnosis not present

## 2014-09-11 ENCOUNTER — Encounter (HOSPITAL_COMMUNITY)
Admission: RE | Admit: 2014-09-11 | Discharge: 2014-09-11 | Disposition: A | Discharge status: Home / Self Care | Payer: Medicare Other | Source: Ambulatory Visit | Attending: Cardiovascular Disease | Admitting: Cardiovascular Disease

## 2014-09-11 DIAGNOSIS — Z5189 Encounter for other specified aftercare: Secondary | ICD-10-CM | POA: Diagnosis not present

## 2014-09-12 DIAGNOSIS — N138 Other obstructive and reflux uropathy: Secondary | ICD-10-CM | POA: Insufficient documentation

## 2014-09-12 DIAGNOSIS — N401 Enlarged prostate with lower urinary tract symptoms: Secondary | ICD-10-CM

## 2014-09-13 ENCOUNTER — Encounter (HOSPITAL_COMMUNITY)
Admission: RE | Admit: 2014-09-13 | Discharge: 2014-09-13 | Disposition: A | Discharge status: Home / Self Care | Payer: Medicare Other | Source: Ambulatory Visit | Attending: Cardiovascular Disease | Admitting: Cardiovascular Disease

## 2014-09-13 DIAGNOSIS — Z5189 Encounter for other specified aftercare: Secondary | ICD-10-CM | POA: Diagnosis not present

## 2014-09-16 ENCOUNTER — Encounter (HOSPITAL_COMMUNITY)
Admission: RE | Admit: 2014-09-16 | Discharge: 2014-09-16 | Disposition: A | Discharge status: Home / Self Care | Payer: Medicare Other | Source: Ambulatory Visit | Attending: Cardiovascular Disease | Admitting: Cardiovascular Disease

## 2014-09-16 DIAGNOSIS — Z5189 Encounter for other specified aftercare: Secondary | ICD-10-CM | POA: Diagnosis not present

## 2014-09-18 ENCOUNTER — Encounter (HOSPITAL_COMMUNITY)
Admission: RE | Admit: 2014-09-18 | Discharge: 2014-09-18 | Disposition: A | Discharge status: Home / Self Care | Payer: Medicare Other | Source: Ambulatory Visit | Attending: Cardiovascular Disease | Admitting: Cardiovascular Disease

## 2014-09-18 DIAGNOSIS — Z5189 Encounter for other specified aftercare: Secondary | ICD-10-CM | POA: Diagnosis not present

## 2014-09-19 ENCOUNTER — Other Ambulatory Visit: Payer: Self-pay | Admitting: Physician Assistant

## 2014-09-19 NOTE — Telephone Encounter (Signed)
Refill sent.

## 2014-09-19 NOTE — Telephone Encounter (Signed)
Please review for refill. Thanks!  

## 2014-09-20 ENCOUNTER — Other Ambulatory Visit: Payer: Self-pay | Admitting: Cardiovascular Disease

## 2014-09-20 ENCOUNTER — Encounter (HOSPITAL_COMMUNITY): Payer: Medicare Other

## 2014-09-22 NOTE — Telephone Encounter (Signed)
Rx(s) sent to pharmacy electronically.  

## 2014-09-23 ENCOUNTER — Encounter (HOSPITAL_COMMUNITY): Payer: Medicare Other

## 2014-09-25 ENCOUNTER — Encounter (HOSPITAL_COMMUNITY)
Admission: RE | Admit: 2014-09-25 | Discharge: 2014-09-25 | Disposition: A | Discharge status: Home / Self Care | Payer: Medicare Other | Source: Ambulatory Visit | Attending: Cardiovascular Disease | Admitting: Cardiovascular Disease

## 2014-09-25 DIAGNOSIS — Z5189 Encounter for other specified aftercare: Secondary | ICD-10-CM | POA: Diagnosis not present

## 2014-09-27 ENCOUNTER — Encounter (HOSPITAL_COMMUNITY)
Admission: RE | Admit: 2014-09-27 | Discharge: 2014-09-27 | Disposition: A | Discharge status: Home / Self Care | Payer: Medicare Other | Source: Ambulatory Visit | Attending: Cardiovascular Disease | Admitting: Cardiovascular Disease

## 2014-09-27 DIAGNOSIS — Z5189 Encounter for other specified aftercare: Secondary | ICD-10-CM | POA: Diagnosis not present

## 2014-09-30 ENCOUNTER — Encounter (HOSPITAL_COMMUNITY)
Admission: RE | Admit: 2014-09-30 | Discharge: 2014-09-30 | Disposition: A | Discharge status: Home / Self Care | Payer: Medicare Other | Source: Ambulatory Visit | Attending: Cardiovascular Disease | Admitting: Cardiovascular Disease

## 2014-09-30 DIAGNOSIS — Z9861 Coronary angioplasty status: Secondary | ICD-10-CM | POA: Diagnosis not present

## 2014-09-30 DIAGNOSIS — Z5189 Encounter for other specified aftercare: Secondary | ICD-10-CM | POA: Insufficient documentation

## 2014-10-02 ENCOUNTER — Encounter (HOSPITAL_COMMUNITY)
Admission: RE | Admit: 2014-10-02 | Discharge: 2014-10-02 | Disposition: A | Discharge status: Home / Self Care | Payer: Medicare Other | Source: Ambulatory Visit | Attending: Cardiovascular Disease | Admitting: Cardiovascular Disease

## 2014-10-02 DIAGNOSIS — Z5189 Encounter for other specified aftercare: Secondary | ICD-10-CM | POA: Diagnosis not present

## 2014-10-04 ENCOUNTER — Encounter (HOSPITAL_COMMUNITY)
Admission: RE | Admit: 2014-10-04 | Discharge: 2014-10-04 | Disposition: A | Discharge status: Home / Self Care | Payer: Medicare Other | Source: Ambulatory Visit | Attending: Cardiovascular Disease | Admitting: Cardiovascular Disease

## 2014-10-04 DIAGNOSIS — Z5189 Encounter for other specified aftercare: Secondary | ICD-10-CM | POA: Diagnosis not present

## 2014-10-07 ENCOUNTER — Encounter (HOSPITAL_COMMUNITY)
Admission: RE | Admit: 2014-10-07 | Discharge: 2014-10-07 | Disposition: A | Discharge status: Home / Self Care | Payer: Medicare Other | Source: Ambulatory Visit | Attending: Cardiovascular Disease | Admitting: Cardiovascular Disease

## 2014-10-07 DIAGNOSIS — Z5189 Encounter for other specified aftercare: Secondary | ICD-10-CM | POA: Diagnosis not present

## 2014-10-09 ENCOUNTER — Encounter (HOSPITAL_COMMUNITY)
Admission: RE | Admit: 2014-10-09 | Discharge: 2014-10-09 | Disposition: A | Discharge status: Home / Self Care | Payer: Medicare Other | Source: Ambulatory Visit | Attending: Cardiovascular Disease | Admitting: Cardiovascular Disease

## 2014-10-09 DIAGNOSIS — Z5189 Encounter for other specified aftercare: Secondary | ICD-10-CM | POA: Diagnosis not present

## 2014-10-11 ENCOUNTER — Encounter (HOSPITAL_COMMUNITY)
Admission: RE | Admit: 2014-10-11 | Discharge: 2014-10-11 | Disposition: A | Discharge status: Home / Self Care | Payer: Medicare Other | Source: Ambulatory Visit | Attending: Cardiovascular Disease | Admitting: Cardiovascular Disease

## 2014-10-11 DIAGNOSIS — Z5189 Encounter for other specified aftercare: Secondary | ICD-10-CM | POA: Diagnosis not present

## 2014-10-14 ENCOUNTER — Encounter (HOSPITAL_COMMUNITY): Payer: Medicare Other

## 2014-10-16 ENCOUNTER — Encounter (HOSPITAL_COMMUNITY)
Admission: RE | Admit: 2014-10-16 | Discharge: 2014-10-16 | Disposition: A | Discharge status: Home / Self Care | Payer: Medicare Other | Source: Ambulatory Visit | Attending: Cardiovascular Disease | Admitting: Cardiovascular Disease

## 2014-10-16 DIAGNOSIS — Z5189 Encounter for other specified aftercare: Secondary | ICD-10-CM | POA: Diagnosis not present

## 2014-10-18 ENCOUNTER — Encounter (HOSPITAL_COMMUNITY)
Admission: RE | Admit: 2014-10-18 | Discharge: 2014-10-18 | Disposition: A | Discharge status: Home / Self Care | Payer: Medicare Other | Source: Ambulatory Visit | Attending: Cardiovascular Disease | Admitting: Cardiovascular Disease

## 2014-10-18 DIAGNOSIS — Z5189 Encounter for other specified aftercare: Secondary | ICD-10-CM | POA: Diagnosis not present

## 2014-10-21 ENCOUNTER — Encounter (HOSPITAL_COMMUNITY)
Admission: RE | Admit: 2014-10-21 | Discharge: 2014-10-21 | Disposition: A | Discharge status: Home / Self Care | Payer: Medicare Other | Source: Ambulatory Visit | Attending: Cardiovascular Disease | Admitting: Cardiovascular Disease

## 2014-10-21 DIAGNOSIS — Z5189 Encounter for other specified aftercare: Secondary | ICD-10-CM | POA: Diagnosis not present

## 2014-10-23 ENCOUNTER — Encounter (HOSPITAL_COMMUNITY)
Admission: RE | Admit: 2014-10-23 | Discharge: 2014-10-23 | Disposition: A | Discharge status: Home / Self Care | Payer: Medicare Other | Source: Ambulatory Visit | Attending: Cardiovascular Disease | Admitting: Cardiovascular Disease

## 2014-10-23 DIAGNOSIS — Z5189 Encounter for other specified aftercare: Secondary | ICD-10-CM | POA: Diagnosis not present

## 2014-10-25 ENCOUNTER — Encounter (HOSPITAL_COMMUNITY)
Admission: RE | Admit: 2014-10-25 | Discharge: 2014-10-25 | Disposition: A | Discharge status: Home / Self Care | Payer: Medicare Other | Source: Ambulatory Visit | Attending: Cardiovascular Disease | Admitting: Cardiovascular Disease

## 2014-10-25 DIAGNOSIS — Z5189 Encounter for other specified aftercare: Secondary | ICD-10-CM | POA: Diagnosis not present

## 2014-10-25 NOTE — Progress Notes (Signed)
Cardiac Rehabilitation Program Outcomes Report   Orientation:  07/18/14 Graduate Date:  10/25/14 Discharge Date:  10/25/14 # of sessions completed: 36  Cardiologist: Croitoru Family MD:  Lorette Ang Time:  1100  A.  Exercise Program:  Tolerates exercise @ 3.68 METS for 30 minutes, Improved functional capacity  6.56 %, Improved  muscular strength  3.29 %, Improved  flexibility 4.17 %, Improved education score 100 % and Discharged  B.  Mental Health:  Good mental attitude  C.  Education/Instruction/Skills  Accurately checks own pulse.  Rest:  68  Exercise:  89, Knows THR for exercise, Uses Perceived Exertion Scale and/or Dyspnea Scale and Attended 100% education classes    D.  Nutrition/Weight Control/Body Composition:  Adherence to prescribed nutrition program: good    E.  Blood Lipids    Lab Results  Component Value Date   CHOL 122 06/17/2014   HDL 54 06/17/2014   LDLCALC 49 06/17/2014   TRIG 96 06/17/2014   CHOLHDL 2.3 06/17/2014    F.  Lifestyle Changes:  Making positive lifestyle changes  G.  Symptoms noted with exercise:  Asymptomatic  Report Completed By:  Felicity Coyer, RN   Comments:  Graduation note.  Pt plans to exercise at home.  Encouraged maintenance program.  CR will make 1 month, 6 month, and 1 year call backs.

## 2014-10-25 NOTE — Progress Notes (Signed)
Patient is discharged from [facility] program today, October 25, 2014 with 36 sessions.  He achieved LTG of 30 minutes of aerobic exercise at max met level of 3.68.  All patient vitals are WNL.  Patient has met with dietician.  Discharge instructions have been reviewed in detail and patient expressed an understanding of material given.  Patient plans to exercise at home. Cardiac Rehab will make 1 month, 6 month and 1 year call backs.  Patient had no complaints of any abnormal S/S or pain on their exit visit.

## 2014-11-01 ENCOUNTER — Other Ambulatory Visit (HOSPITAL_COMMUNITY): Payer: Self-pay | Admitting: Oncology

## 2014-11-04 ENCOUNTER — Ambulatory Visit (HOSPITAL_COMMUNITY)
Admission: RE | Admit: 2014-11-04 | Discharge: 2014-11-04 | Disposition: A | Discharge status: Home / Self Care | Payer: Medicare Other | Source: Ambulatory Visit | Attending: Rheumatology | Admitting: Rheumatology

## 2014-11-04 ENCOUNTER — Other Ambulatory Visit (HOSPITAL_COMMUNITY): Payer: Self-pay | Admitting: Rheumatology

## 2014-11-04 DIAGNOSIS — M4856XA Collapsed vertebra, not elsewhere classified, lumbar region, initial encounter for fracture: Secondary | ICD-10-CM | POA: Diagnosis not present

## 2014-11-04 DIAGNOSIS — R52 Pain, unspecified: Secondary | ICD-10-CM

## 2014-11-04 DIAGNOSIS — M545 Low back pain: Secondary | ICD-10-CM | POA: Diagnosis present

## 2014-11-04 DIAGNOSIS — G8929 Other chronic pain: Secondary | ICD-10-CM | POA: Insufficient documentation

## 2014-11-04 DIAGNOSIS — I739 Peripheral vascular disease, unspecified: Secondary | ICD-10-CM | POA: Insufficient documentation

## 2014-12-10 ENCOUNTER — Ambulatory Visit (HOSPITAL_COMMUNITY): Payer: TRICARE For Life (TFL) | Admitting: Physical Therapy

## 2014-12-11 ENCOUNTER — Ambulatory Visit (HOSPITAL_COMMUNITY): Payer: Medicare Other | Attending: Rheumatology | Admitting: Physical Therapy

## 2014-12-11 ENCOUNTER — Encounter (HOSPITAL_COMMUNITY): Payer: Self-pay | Admitting: Physical Therapy

## 2014-12-11 DIAGNOSIS — R293 Abnormal posture: Secondary | ICD-10-CM | POA: Diagnosis not present

## 2014-12-11 DIAGNOSIS — Z9181 History of falling: Secondary | ICD-10-CM | POA: Diagnosis not present

## 2014-12-11 DIAGNOSIS — R29898 Other symptoms and signs involving the musculoskeletal system: Secondary | ICD-10-CM | POA: Diagnosis not present

## 2014-12-11 DIAGNOSIS — R2689 Other abnormalities of gait and mobility: Secondary | ICD-10-CM | POA: Insufficient documentation

## 2014-12-11 DIAGNOSIS — M6281 Muscle weakness (generalized): Secondary | ICD-10-CM

## 2014-12-11 NOTE — Patient Instructions (Signed)
Ankle Dorsiflexion / Plantar Flexion   In standing, stand at counter top and stand up on your tip toes 10 times. In sitting, raise your toes up into the air 10 times.  Repeat sequence __1__ times per session. Do __2__ sessions per day Ankle Inversion   With ankle movement only, move left foot so sole of foot faces inward. Repeat _10___ times per session. Do __2__ sessions per day. Position: Sitting   Copyright  VHI. All rights reserved.  Ankle Eversion   With ankle movement only, move left foot so sole of foot faces outward. Repeat __10__ times per session. Do __2__ sessions per day. Position: Sitting    Copyright  VHI. All rights reserved.  Marland Kitchen Position: Standing   Copyright  VHI. All rights reserved.    Alternating Step   Holding onto counter top, raise one leg up and hold it for 2-3 seconds. Repeat with your other leg.  Repeat __10__ times. Do __2__ sessions per day.  http://gt2.exer.us/493   Copyright  VHI. All rights reserved.   ABDUCTION: Standing (Active)   Stand, feet flat. Lift right leg out to side. Use _0__ lbs. Keep your toes facing forward.  Complete _1__ sets of _10__ repetitions each leg. Perform _2__ sessions per day.  http://gtsc.exer.us/110   Copyright  VHI. All rights reserved.

## 2014-12-11 NOTE — Therapy (Signed)
David Pugh, [place], [address] Phone: [phone]   Fax:  [phone]  Physical Therapy Evaluation  Patient Details  Name: David Pugh MRN: [id number] Date of Birth: 05/13/1935 Referring Provider:  Hurley Cisco, MD  Encounter Date: 12/11/2014      PT End of Session - 12/11/14 1020    Visit Number 1   Number of Visits 16   Date for PT Re-Evaluation 01/08/15   Authorization Type [employer]    Authorization Time Period 12/11/14 to 02/10/15   Authorization - Visit Number 1   Authorization - Number of Visits 10   Activity Tolerance Patient tolerated treatment well   Behavior During Therapy [facility] for tasks assessed/performed      Past Medical History  Diagnosis Date  . Prostate cancer 2010    s/p radiation, surgery  . Radiation Feb 2011    treatment  . Biliary dyskinesia   . GERD (gastroesophageal reflux disease)   . Hiatal hernia 2010    tiny  . HTN (hypertension)   . Rosacea   . PTSD (post-traumatic stress disorder)   . Diverticula of colon 2010  . Colon polyps     tubular adenomas  . Arthritis   . Exposure to [employer]   . CAD (coronary artery disease) 2012    a. hx of stent x2 in ramus/OM and mid LAD in 2012  b. balloon angioplaty of OM  . Peripheral neuropathy   . Anemia     hx of anemia as a child   . Colon cancer 12/2011    s/p right hemicolectomy, did not tolerate chemo  . Iron deficiency 05/02/2012    followed by Dr. Tressie [name], iron infusions.   . Folic acid deficiency   . Heart palpitations 10/2012  . Gastroparesis 04/12/2013    borderline  . Myocardial infarction 2012  . Glaucoma   . Abnormality of gait 12/18/2013  . Radiation proctitis     Past Surgical History  Procedure Laterality Date  . Cholecystectomy    . Prostatectomy    . Tonsillectomy    . Transurethral resection of prostate    . Esophagogastroduodenoscopy  10/10/2008    Dr. Tilda Burrow hiatal hernia, normal  esophagus, normal stomach  . Colonoscopy  02/12/2004    Dr. Gala Romney- L side diverticula, inflammatory  colon polyps  . Esophagogastroduodenoscopy  02/12/2004    Dr. Dudley Major- normal   . Coronary stent placement  01/2011    2.5x85mm Xience drug-eluting stent in ramus intermedius/OMI vessel, 2.5x74mm Promus Element stent in mid LAD artery  . 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 Multiple colonic  polyps removed as described above. Sigmoid Diverticulosis/ 1.5 x 2 cm relatively flat ulcerated lesion in cecum/path showed adenocarcinoma of the colon. descending colon polyp with tubular adenoma and one fragment of focal high grade dysplasia.   . Knee surgery      left knee   . Partial colectomy  02/29/2012    Procedure: PARTIAL COLECTOMY;  Surgeon: Adin Hector, MD;  [facility]: [facility];  Service: General;  Laterality: Right;  . Vasectomy    . Colonoscopy  08/28/2012    YIR:SWNIOEVOJ proctitis. Colonic diverticulosis/Ulcerations at the surgical anastomosis  path: ulcerated colonic mucosa with prolapse changes, tubular adenoma.   . Cardiac catheterization  02/13/2011    [facility] cardiology  . Cataract extraction      2011  . Colonoscopy N/A 08/02/2013  Procedure: COLONOSCOPY;  Surgeon: Daneil Dolin, MD;  [facility];  Service: Endoscopy;  Laterality: N/A;  1200  . Cataract extraction w/phaco Right 08/06/2013    Procedure: CATARACT EXTRACTION PHACO AND INTRAOCULAR LENS PLACEMENT (IOC);  Surgeon: Tonny Branch, MD;  Location: [facility] ORS;  Service: Ophthalmology;  Laterality: Right;  CDE:20.96  . Coronary angioplasty  06/17/14    OM1 POBA  . Left heart catheterization with coronary angiogram N/A 06/17/2014    Procedure: LEFT HEART CATHETERIZATION WITH CORONARY ANGIOGRAM;  Surgeon: Burnell Blanks, MD;  Location: [facility] CATH LAB;  Service: Cardiovascular;  Laterality: N/A;    There were no vitals filed for  this visit.  Visit Diagnosis:  Poor balance - Plan: PT plan of care cert/re-cert  Weakness of both legs - Plan: PT plan of care cert/re-cert  Proximal muscle weakness - Plan: PT plan of care cert/re-cert  Poor posture - Plan: PT plan of care cert/re-cert  History of recent fall - Plan: PT plan of care cert/re-cert      Subjective Assessment - 12/11/14 0934    Subjective Difficulty with uneven ground, surface and height changes, numbness in feet secondary to peripheral neuropathy. 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    Pertinent History Over the past 3-4 years, patient has been less and less sure on his feet; has been stumbling on level surfaces but has been able to catch himself each time. 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 [facility].    Patient Stated Goals to get balance better in general    Currently in Pain? No/denies            [facility] PT Assessment - 12/11/14 0001    Assessment   Medical Diagnosis balance deficits    Onset Date 12/11/10  approximate onset (3-4 years)   Next [facility] on 20th of April   Precautions   Precautions None   Restrictions   Weight Bearing Restrictions No   Balance Screen   Has the patient fallen in the past 6 months Yes   How many times? 2  both falls from tripping over an object    Has the patient had a decrease in activity level because of a fear of falling?  Yes   Is the patient reluctant to leave their home because of a fear of falling?  Yes   Prior Function   Level of Independence Independent with basic ADLs;Independent with homemaking with ambulation;Independent with gait;Independent with transfers   Vocation Retired   Leisure skeet shooting    Observation/Other Assessments   Focus on Therapeutic Outcomes (FOTO)  31% limited    AROM   Right Hip  External Rotation  35   Right Hip Internal Rotation  35   Left Hip External Rotation  32   Left Hip Internal Rotation  34   Right Ankle Dorsiflexion 10   Left Ankle Dorsiflexion 12   Strength   Right Hip Flexion 3/5   Right Hip Extension 3-/5   Right Hip ABduction 2+/5   Left Hip Flexion 3/5   Left Hip Extension 3-/5   Left Hip ABduction 3/5   Right Knee Flexion 4+/5   Right Knee Extension 4/5   Left Knee Flexion 4/5   Left Knee Extension 4-/5   Right Ankle  Dorsiflexion 3+/5   Left Ankle Dorsiflexion 3+/5   Ambulation/Gait   Gait Comments Rigid hips and spine with almost no rotations, flexed trunk, ER of bilateral hips, supination and difficulty with heel to toe gait, reduced bilateral dorsiflexion, reduced TKE bilaterally, reduced step/stride length bilateral    Berg Balance Test   Sit to Stand Able to stand without using hands and stabilize independently   Standing Unsupported Able to stand safely 2 minutes   Sitting with Back Unsupported but Feet Supported on Floor or Stool Able to sit safely and securely 2 minutes   Stand to Sit Sits safely with minimal use of hands   Transfers Able to transfer safely, minor use of hands   Standing Unsupported with Eyes Closed Able to stand 10 seconds with supervision   Standing Ubsupported with Feet Together Able to place feet together independently and stand for 1 minute with supervision   From Standing, Reach Forward with Outstretched Arm Can reach confidently >25 cm (10")   From Standing Position, Pick up Object from Floor Able to pick up shoe, needs supervision   From Standing Position, Turn to Look Behind Over each Shoulder Looks behind one side only/other side shows less weight shift   Turn 360 Degrees Able to turn 360 degrees safely one side only in 4 seconds or less   Standing Unsupported, Alternately Place Feet on Step/Stool Able to stand independently and complete 8 steps >20 seconds   Standing Unsupported, One Foot in [employer]  balance while stepping or standing   Standing on One Leg Able to lift leg independently and hold equal to or more than 3 seconds   Total Score 44                           PT Education - 12/11/14 1019    Education provided Yes   Education Details Prognosis with skilled PT services; PT plan of care moving forward; HEP   Person(s) Educated Patient   Methods Explanation;Demonstration;Handout   Comprehension Verbalized understanding;Returned demonstration          PT Short Term Goals - 12/11/14 1021    PT SHORT TERM GOAL #1   Title Patient will improve his score on the Berg balance test to at least 53 in order to reduce overall fall risk    Time 4   Period Weeks   Status New   PT SHORT TERM GOAL #2   Title Patient will muscle strength of all tested muscles in bilateral lower extremities to at least 4/5 in order to improve overall stability and reduce fall risk   Time 4   Period Weeks   Status New   PT SHORT TERM GOAL #3   Title Patient will improve bilateral hip ER to at least 40 degrees, bilateral hip IR to at least 40 degrees, and bilateral ankle dorsiflexion to 20 degrees  in order to improve overall mechanics and balance reaction skills   Time 4   Period Weeks   Status New   PT SHORT TERM GOAL #4   Title Patient will be able to ambulate over an uneven, unpredictable surface for at least 191ft with no balance loss, no ankle injury, no assistive device, and no falls reported    Time 4   Period Weeks   Status New           PT Long Term Goals - 12/11/14 1024    PT LONG TERM GOAL #1  Title Patient will be independent in correctly and consistently performing appropriate advanced HEP    Time 8   Period Weeks   Status New   PT LONG TERM GOAL #2   Title Patient will be able to perform single leg stance for at least 30 seconds with bilateral lower extremities, and will be able to maintain tandem stance for at least 60 seconds without loss of balance     Time 8   Period Weeks   Status New   PT LONG TERM GOAL #3   Title Patient will report that he is able to do his daily tasks and work around his property without fear of falling, also with no ankle injuries or falls reported    Time 8   Period Weeks   Status New   PT LONG TERM GOAL #4   Title Patient will demonstrate at least 4+/5 strength in bilateral lower extremities and proximal muscles in order to enhance overall stability and reduce fall risk   Time 8   Period Weeks   Status New               Plan - 01/01/15 1020    Clinical Impression Statement Patient presents with generalized weakness especially in bilateral hips and ankles, reduced mobility bilateral hips and ankles, postural impairments, significant gait abnormalities, poor balance and balance reaction skills, and overall difficulty in performing functional tasks especially on uneven surfaces or in situations where surface changes are involved. Patient also has peripheral neuropathy in bilateral lower extremities. Patient also states that he is to undergo Agent Orange testing next week, as he was exposed during his time in [employer]. Patient also states that his ankles are fairly weak and that he has rolled his ankle just standing on an unstable surface before; ankle strength testing also reveals weakness in this joint. Patient will benefit from skilled PT services in order to address these deficits and to assist him in reaching an optimal level of function.   Pt will benefit from skilled therapeutic intervention in order to improve on the following deficits Decreased coordination;Decreased range of motion;Difficulty walking;Impaired flexibility;Improper body mechanics;Postural dysfunction;Decreased balance;Decreased mobility;Decreased strength;Other (comment);Abnormal gait  impaired proprioception   Rehab Potential Good   PT Frequency 2x / week   PT Duration 8 weeks   PT Treatment/Interventions ADLs/Self Care Home  Management;Gait training;Neuromuscular re-education;Stair training;Functional mobility training;Patient/family education;Therapeutic activities;Therapeutic exercise;Manual techniques;Energy conservation;Balance training   PT Next Visit Plan Functional stretching and strengthening, balance tasks, gait training, ankle strength/range    PT Home Exercise Plan ankle DF/PF/inversion/eversion, standing hip ABD, standing marches with 2 second hold    Consulted and Agree with Plan of Care Patient          G-Codes - 01/01/15 1029    Functional Assessment Tool Used FOTO    Functional Limitation Mobility: Walking and moving around   Mobility: Walking and Moving Around Current Status [phone]) At least 20 percent but less than 40 percent impaired, limited or restricted   Mobility: Walking and Moving Around Goal Status [phone]) At least 1 percent but less than 20 percent impaired, limited or restricted       Problem List Patient Active Problem List   Diagnosis Date Noted  . H/O prostate cancer 08/20/2014  . Radiation proctitis 08/19/2014  . H/O agent Orange exposure 07/01/2014  . Unstable angina 06/16/2014  . Shingles outbreak, right anterior thoracic 01/28/2014  . Abnormality of gait 12/18/2013  . Viral gastroenteritis 11/26/2013  . Dyslipidemia 02/12/2013  .  Peripheral neuropathy 10/14/2012  . CAD (coronary artery disease) 10/14/2012  . HTN (hypertension) 10/14/2012  . Hx of adenomatous colonic polyps 07/26/2012  . Folic acid deficiency 50/15/8682  . Iron deficiency 05/02/2012  . Cancer of ascending colon 03/29/2012  . GERD 03/09/2010  . PREMATURE VENTRICULAR CONTRACTIONS 04/30/2009    Deniece Ree PT, DPT [phone]  [facility] [address] [place], [place], [address] Phone: [phone]   Fax:  [phone]

## 2014-12-13 ENCOUNTER — Encounter (HOSPITAL_COMMUNITY): Payer: Medicare Other | Attending: Cardiovascular Disease

## 2014-12-13 ENCOUNTER — Encounter (HOSPITAL_COMMUNITY): Payer: Self-pay | Admitting: Hematology & Oncology

## 2014-12-13 ENCOUNTER — Encounter (HOSPITAL_COMMUNITY): Payer: Medicare Other | Attending: Hematology & Oncology | Admitting: Hematology & Oncology

## 2014-12-13 VITALS — BP 140/75 | HR 62 | Temp 98.4°F | Resp 16 | Wt 227.0 lb

## 2014-12-13 DIAGNOSIS — E611 Iron deficiency: Secondary | ICD-10-CM | POA: Insufficient documentation

## 2014-12-13 DIAGNOSIS — D72829 Elevated white blood cell count, unspecified: Secondary | ICD-10-CM

## 2014-12-13 DIAGNOSIS — E538 Deficiency of other specified B group vitamins: Secondary | ICD-10-CM | POA: Diagnosis not present

## 2014-12-13 DIAGNOSIS — Z8546 Personal history of malignant neoplasm of prostate: Secondary | ICD-10-CM

## 2014-12-13 DIAGNOSIS — Z5189 Encounter for other specified aftercare: Secondary | ICD-10-CM | POA: Diagnosis present

## 2014-12-13 DIAGNOSIS — K627 Radiation proctitis: Secondary | ICD-10-CM | POA: Diagnosis not present

## 2014-12-13 DIAGNOSIS — C182 Malignant neoplasm of ascending colon: Secondary | ICD-10-CM | POA: Insufficient documentation

## 2014-12-13 DIAGNOSIS — C61 Malignant neoplasm of prostate: Secondary | ICD-10-CM | POA: Diagnosis not present

## 2014-12-13 DIAGNOSIS — Z9861 Coronary angioplasty status: Secondary | ICD-10-CM | POA: Diagnosis not present

## 2014-12-13 LAB — CBC WITH DIFFERENTIAL/PLATELET
BASOS PCT: 0 % (ref 0–1)
Basophils Absolute: 0 10*3/uL (ref 0.0–0.1)
EOS ABS: 0.6 10*3/uL (ref 0.0–0.7)
Eosinophils Relative: 5 % (ref 0–5)
HEMATOCRIT: 43.6 % (ref 39.0–52.0)
HEMOGLOBIN: 14.3 g/dL (ref 13.0–17.0)
LYMPHS ABS: 4 10*3/uL (ref 0.7–4.0)
Lymphocytes Relative: 35 % (ref 12–46)
MCH: 30.5 pg (ref 26.0–34.0)
MCHC: 32.8 g/dL (ref 30.0–36.0)
MCV: 93 fL (ref 78.0–100.0)
Monocytes Absolute: 1 10*3/uL (ref 0.1–1.0)
Monocytes Relative: 9 % (ref 3–12)
Neutro Abs: 5.9 10*3/uL (ref 1.7–7.7)
Neutrophils Relative %: 51 % (ref 43–77)
Platelets: 295 10*3/uL (ref 150–400)
RBC: 4.69 MIL/uL (ref 4.22–5.81)
RDW: 13.4 % (ref 11.5–15.5)
WBC: 11.5 10*3/uL — AB (ref 4.0–10.5)

## 2014-12-13 LAB — COMPREHENSIVE METABOLIC PANEL
ALBUMIN: 3.7 g/dL (ref 3.5–5.2)
ALT: 20 U/L (ref 0–53)
AST: 20 U/L (ref 0–37)
Alkaline Phosphatase: 53 U/L (ref 39–117)
Anion gap: 6 (ref 5–15)
BILIRUBIN TOTAL: 0.8 mg/dL (ref 0.3–1.2)
BUN: 20 mg/dL (ref 6–23)
CHLORIDE: 109 mmol/L (ref 96–112)
CO2: 27 mmol/L (ref 19–32)
Calcium: 9.1 mg/dL (ref 8.4–10.5)
Creatinine, Ser: 1.28 mg/dL (ref 0.50–1.35)
GFR calc Af Amer: 61 mL/min — ABNORMAL LOW (ref >90)
GFR, EST NON AFRICAN AMERICAN: 52 mL/min — AB (ref >90)
Glucose, Bld: 95 mg/dL (ref 70–99)
POTASSIUM: 4.3 mmol/L (ref 3.5–5.1)
SODIUM: 142 mmol/L (ref 135–145)
Total Protein: 6.6 g/dL (ref 6.0–8.3)

## 2014-12-13 NOTE — Progress Notes (Addendum)
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 R hemicolectomy. Inability to tolerate adjuvant XELODA 02/29/2012 History of prostate cancer. TURP X 2, XRT at [facility] in 2011  CURRENT THERAPY: Observation  INTERVAL HISTORY: David Pugh 80 y.o. male with a history of CAD, Stage III CRC, prostate cancer who is doing well. He is here today for regular follow-up. He has recently been started on flomax and reports urinary frequency. He is up to date with his screening colonoscopy.  He will be due for repeat CT imaging in October.  He gets a lot of his care from a local [place]. He noticed that on several CBCs from the [place] his white blood count has been elevated. I reviewed his laboratory studies here and have shown him a similar trend. Labs from December also suggested atypical lymphocytes on a differential. He denies weight loss, fever, chills or any lumps bumps or other abnormalities.  Past Medical History  Diagnosis Date  . Prostate cancer 2010    s/p radiation, surgery  . Radiation Feb 2011    treatment  . Biliary dyskinesia   . GERD (gastroesophageal reflux disease)   . Hiatal hernia 2010    tiny  . HTN (hypertension)   . Rosacea   . PTSD (post-traumatic stress disorder)   . Diverticula of colon 2010  . Colon polyps     tubular adenomas  . Arthritis   . Exposure to [employer]   . CAD (coronary artery disease) 2012    a. hx of stent x2 in ramus/OM and mid LAD in 2012  b. balloon angioplaty of OM  . Peripheral neuropathy   . Anemia     hx of anemia as a child   . Colon cancer 12/2011    s/p right hemicolectomy, did not tolerate chemo  . Iron deficiency 05/02/2012    followed by Dr. Tressie [name], iron infusions.   . Folic acid deficiency   . Heart palpitations 10/2012  . Gastroparesis 04/12/2013    borderline  . Myocardial infarction 2012  . Glaucoma   . Abnormality of gait 12/18/2013  . Radiation proctitis     has  PREMATURE VENTRICULAR CONTRACTIONS; GERD; Cancer of ascending colon; Iron deficiency; Hx of adenomatous colonic polyps; Folic acid deficiency; Peripheral neuropathy; CAD (coronary artery disease); HTN (hypertension); Dyslipidemia; Viral gastroenteritis; Abnormality of gait; Shingles outbreak, right anterior thoracic; Unstable angina; H/O agent Orange exposure; Radiation proctitis; and H/O prostate cancer on his problem list.      Cancer of ascending colon   02/29/2012 Procedure laparoscopic assisted R colectomy   03/09/2012 Miscellaneous Iron deficiency and folic acid deficiency   03/29/2012 Initial Diagnosis Cancer of ascending colon, CEA on 01/19/2012 was WNL   08/02/2013 Procedure Ileocolonoscopy with snare polypectomy   06/11/2014 Imaging CT abdomen/pelvis with no findings to suggest metastatic disease. Diverticulosis     is allergic to shellfish allergy; sulfa antibiotics; and sulfonamide derivatives.  David Pugh does not currently have medications on file.  Past Surgical History  Procedure Laterality Date  . Cholecystectomy    . Prostatectomy    . Tonsillectomy    . Transurethral resection of prostate    . Esophagogastroduodenoscopy  10/10/2008    Dr. Tilda Burrow hiatal hernia, normal esophagus, normal stomach  . Colonoscopy  02/12/2004    Dr. Gala Romney- L side diverticula, inflammatory  colon polyps  . Esophagogastroduodenoscopy  02/12/2004    Dr. Dudley Major- normal   .  Coronary stent placement  01/2011    2.5x25mm Xience drug-eluting stent in ramus intermedius/OMI vessel, 2.5x45mm Promus Element stent in mid LAD artery  . 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 Multiple colonic  polyps removed as described above. Sigmoid Diverticulosis/ 1.5 x 2 cm relatively flat ulcerated lesion in cecum/path showed adenocarcinoma of the colon. descending colon polyp with tubular adenoma and one fragment of focal high grade  dysplasia.   . Knee surgery      left knee   . Partial colectomy  02/29/2012    Procedure: PARTIAL COLECTOMY;  Surgeon: Adin Hector, MD;  Location: [facility];  Service: General;  Laterality: Right;  . Vasectomy    . Colonoscopy  08/28/2012    QVZ:DGLOVFIEP proctitis. Colonic diverticulosis/Ulcerations at the surgical anastomosis  path: ulcerated colonic mucosa with prolapse changes, tubular adenoma.   . Cardiac catheterization  02/13/2011    [facility] cardiology  . Cataract extraction      2011  . Colonoscopy N/A 08/02/2013    Procedure: COLONOSCOPY;  Surgeon: Daneil Dolin, MD;  [facility];  Service: Endoscopy;  Laterality: N/A;  1200  . Cataract extraction w/phaco Right 08/06/2013    Procedure: CATARACT EXTRACTION PHACO AND INTRAOCULAR LENS PLACEMENT (IOC);  Surgeon: Tonny Branch, MD;  Location: [facility];  Service: Ophthalmology;  Laterality: Right;  CDE:20.96  . Coronary angioplasty  06/17/14    OM1 POBA  . Left heart catheterization with coronary angiogram N/A 06/17/2014    Procedure: LEFT HEART CATHETERIZATION WITH CORONARY ANGIOGRAM;  Surgeon: Burnell Blanks, MD;  Location: [facility] CATH LAB;  Service: Cardiovascular;  Laterality: N/A;    Review of Systems  Constitutional: Negative for fever, chills, weight loss and malaise/fatigue.  HENT: Negative for congestion, hearing loss, nosebleeds, sore throat and tinnitus.   Eyes: Negative for blurred vision, double vision, pain and discharge.  Respiratory: Negative for cough, hemoptysis, sputum production, shortness of breath and wheezing.   Cardiovascular: Negative for chest pain, palpitations, claudication, leg swelling and PND.  Gastrointestinal: Negative for heartburn, nausea, vomiting, abdominal pain, diarrhea, constipation, blood in stool and melena.  Genitourinary: Negative for dysuria, urgency, frequency and hematuria.  Musculoskeletal: Positive for joint pain. Negative for myalgias and falls.  Skin: Negative  for itching and rash.  Neurological: Negative for dizziness, tingling, tremors, sensory change, speech change, focal weakness, seizures, loss of consciousness, weakness and headaches.  Endo/Heme/Allergies: Does not bruise/bleed easily.  Psychiatric/Behavioral: Negative for depression, suicidal ideas, memory loss and substance abuse. The patient is not nervous/anxious and does not have insomnia.     PHYSICAL EXAMINATION  ECOG PERFORMANCE STATUS: 0 - Asymptomatic  Filed Vitals:   12/13/14 0945  BP: 140/75  Pulse: 62  Temp: 98.4 F (36.9 C)  Resp: 16    Physical Exam  Constitutional: He is oriented to person, place, and time and well-developed, well-nourished, and in no distress. No distress.  HENT:  Head: Normocephalic.  Mouth/Throat: Oropharynx is clear and moist. No oropharyngeal exudate.  Eyes: Conjunctivae and EOM are normal. Pupils are equal, round, and reactive to light. No scleral icterus.  Neck: Normal range of motion. Neck supple. No JVD present. No tracheal deviation present. No thyromegaly present.  Cardiovascular: Normal rate, regular rhythm and normal heart sounds.   No murmur heard. Pulmonary/Chest: Effort normal and breath sounds normal. No respiratory distress. He has no wheezes. He has no rales. He exhibits no tenderness.  Abdominal: Soft. Bowel  sounds are normal. He exhibits no distension and no mass. There is no tenderness. There is no rebound and no guarding.  Musculoskeletal: Normal range of motion. He exhibits no edema.  Lymphadenopathy:    He has no cervical adenopathy.  Neurological: He is alert and oriented to person, place, and time. No cranial nerve deficit. Coordination normal.  Skin: Skin is warm and dry. He is not diaphoretic.  Psychiatric: Mood, memory, affect and judgment normal.    LABORATORY DATA: CBC    Component Value Date/Time   WBC 11.5* 12/13/2014 0941   RBC 4.69 12/13/2014 0941   HGB 14.3 12/13/2014 0941   HCT 43.6 12/13/2014 0941    PLT 295 12/13/2014 0941   MCV 93.0 12/13/2014 0941   MCH 30.5 12/13/2014 0941   MCHC 32.8 12/13/2014 0941   RDW 13.4 12/13/2014 0941   LYMPHSABS 4.0 12/13/2014 0941   MONOABS 1.0 12/13/2014 0941   EOSABS 0.6 12/13/2014 0941   BASOSABS 0.0 12/13/2014 0941    CMP     Component Value Date/Time   NA 142 12/13/2014 0941   K 4.3 12/13/2014 0941   CL 109 12/13/2014 0941   CO2 27 12/13/2014 0941   GLUCOSE 95 12/13/2014 0941   BUN 20 12/13/2014 0941   CREATININE 1.28 12/13/2014 0941   CALCIUM 9.1 12/13/2014 0941   PROT 6.6 12/13/2014 0941   PROT 6.4 12/18/2013 0941   ALBUMIN 3.7 12/13/2014 0941   AST 20 12/13/2014 0941   ALT 20 12/13/2014 0941   ALKPHOS 53 12/13/2014 0941   BILITOT 0.8 12/13/2014 0941   GFRNONAA 52* 12/13/2014 0941   GFRAA 61* 12/13/2014 0941      ASSESSMENT/THERAPY PLAN:  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 R hemicolectomy. Inability to tolerate adjuvant XELODA 02/29/2012 History of prostate cancer. TURP X 2, XRT at [facility] in 2011  Leukocytosis  We discussed ongoing observation of his colon cancer. He will be due for repeat reimaging in October. He is up-to-date on his colonoscopy.  He has no evidence of recurrent prostate cancer. He continues to follow with the [facility] for most of his care.  In regards to his leukocytosis based upon differential from December of last year and reviewing all of his labs I have recommended a flow cytometry. I spent time discussing with the patient and his wife the possibility of a chronic leukemia versus a benign process. I explained to them the process of flow cytometry would be looking for. We will plan on seeing him back again in several weeks to review the results. There have interim problems or concerns I have advised them to call.  Orders Placed This Encounter  Procedures  . CBC with Differential    Standing Status: Future     Number of Occurrences:      Standing Expiration Date: 12/12/2016  . Comprehensive metabolic  panel    Standing Status: Future     Number of Occurrences:      Standing Expiration Date: 12/12/2016  . CEA    Standing Status: Future     Number of Occurrences:      Standing Expiration Date: 12/12/2016  . Ferritin    Standing Status: Future     Number of Occurrences:      Standing Expiration Date: 12/12/2016  . CEA    Standing Status: Future     Number of Occurrences: 1     Standing Expiration Date: 12/13/2015   This document serves as a record of services personally performed  by Ancil Linsey, MD. It was created on her behalf by Arlyce Harman, a trained medical scribe. The creation of this record is based on the scribe's personal observations and the provider's statements to them. This document has been checked and approved by the attending provider. This note was electronically signed. I have reviewed the above documentation for accuracy and completeness and I agree with the above.  Molli Hazard MD 12/13/2014

## 2014-12-13 NOTE — Patient Instructions (Signed)
[place] at [facility] Discharge Instructions  RECOMMENDATIONS MADE BY THE CONSULTANT AND ANY TEST RESULTS WILL BE SENT TO YOUR REFERRING PHYSICIAN.  Exam and discussion by Dr. Whitney Muse. Will check some additional labs today because your white blood count has gone up some. Report fevers, night sweats, unexplained weight loss, changes in bowel habits or other concerns.  Follow-up in 3 months with labs and office visit.   Thank you for choosing [place] at [facility] to provide your oncology and hematology care.  To afford each patient quality time with our provider, please arrive at least 15 minutes before your scheduled appointment time.    You need to re-schedule your appointment should you arrive 10 or more minutes late.  We strive to give you quality time with our providers, and arriving late affects you and other patients whose appointments are after yours.  Also, if you no show three or more times for appointments you may be dismissed from the clinic at the providers discretion.     Again, thank you for choosing [facility].  Our hope is that these requests will decrease the amount of time that you wait before being seen by our physicians.       _____________________________________________________________  Should you have questions after your visit to [facility], please contact our office at [phone] between the hours of 8:30 a.m. and 4:30 p.m.  Voicemails left after 4:30 p.m. will not be returned until the following business day.  For prescription refill requests, have your pharmacy contact our office.

## 2014-12-13 NOTE — Progress Notes (Signed)
Labs drawn

## 2014-12-14 LAB — FOLATE: Folate: >20 ng/mL

## 2014-12-14 LAB — CEA: CEA: 1.6 ng/mL (ref 0.0–4.7)

## 2014-12-14 LAB — FERRITIN: Ferritin: 36 ng/mL (ref 22–322)

## 2014-12-14 LAB — PSA: PSA: 0.26 ng/mL (ref <=4.00)

## 2014-12-17 ENCOUNTER — Other Ambulatory Visit (HOSPITAL_COMMUNITY): Payer: Self-pay | Admitting: Hematology & Oncology

## 2014-12-20 ENCOUNTER — Other Ambulatory Visit (HOSPITAL_COMMUNITY): Payer: Medicare Other

## 2014-12-20 ENCOUNTER — Ambulatory Visit (HOSPITAL_COMMUNITY): Payer: Medicare Other | Admitting: Hematology & Oncology

## 2014-12-24 ENCOUNTER — Ambulatory Visit (HOSPITAL_COMMUNITY): Payer: Medicare Other | Admitting: Physical Therapy

## 2014-12-26 ENCOUNTER — Ambulatory Visit (HOSPITAL_COMMUNITY): Payer: Medicare Other | Admitting: Physical Therapy

## 2014-12-26 DIAGNOSIS — R29898 Other symptoms and signs involving the musculoskeletal system: Secondary | ICD-10-CM

## 2014-12-26 DIAGNOSIS — Z9181 History of falling: Secondary | ICD-10-CM

## 2014-12-26 DIAGNOSIS — R2689 Other abnormalities of gait and mobility: Secondary | ICD-10-CM | POA: Diagnosis not present

## 2014-12-26 DIAGNOSIS — M6281 Muscle weakness (generalized): Secondary | ICD-10-CM

## 2014-12-26 DIAGNOSIS — R293 Abnormal posture: Secondary | ICD-10-CM

## 2014-12-26 NOTE — Addendum Note (Signed)
Addended by: Hunt Oris on: 12/26/2014 08:40 AM   Modules accepted: Orders

## 2014-12-26 NOTE — Therapy (Signed)
[place], [place], [address] Phone: [phone]   Fax:  [phone]  Physical Therapy Treatment  Patient Details  Name: David Pugh MRN: [id number] Date of Birth: 1934/12/19 Referring Provider:  [name]., MD  Encounter Date: 12/26/2014      PT End of Session - 12/26/14 1717    Visit Number 2   Number of Visits 16   Date for PT Re-Evaluation 01/08/15   Authorization Type [employer]    Authorization Time Period 12/11/14 to 02/10/15   Authorization - Visit Number 2   Authorization - Number of Visits 10   PT Start Time 7824   PT Stop Time 1655   PT Time Calculation (min) 50 min   Activity Tolerance Patient tolerated treatment well   Behavior During Therapy [facility] for tasks assessed/performed      Past Medical History  Diagnosis Date  . Prostate cancer 2010    s/p radiation, surgery  . Radiation Feb 2011    treatment  . Biliary dyskinesia   . GERD (gastroesophageal reflux disease)   . Hiatal hernia 2010    tiny  . HTN (hypertension)   . Rosacea   . PTSD (post-traumatic stress disorder)   . Diverticula of colon 2010  . Colon polyps     tubular adenomas  . Arthritis   . Exposure to [employer]   . CAD (coronary artery disease) 2012    a. hx of stent x2 in ramus/OM and mid LAD in 2012  b. balloon angioplaty of OM  . Peripheral neuropathy   . Anemia     hx of anemia as a child   . Colon cancer 12/2011    s/p right hemicolectomy, did not tolerate chemo  . Iron deficiency 05/02/2012    followed by Dr. Tressie [name], iron infusions.   . Folic acid deficiency   . Heart palpitations 10/2012  . Gastroparesis 04/12/2013    borderline  . Myocardial infarction 2012  . Glaucoma   . Abnormality of gait 12/18/2013  . Radiation proctitis     Past Surgical History  Procedure Laterality Date  . Cholecystectomy    . Prostatectomy    . Tonsillectomy    . Transurethral resection of prostate    .  Esophagogastroduodenoscopy  10/10/2008    Dr. Tilda Burrow hiatal hernia, normal esophagus, normal stomach  . Colonoscopy  02/12/2004    Dr. Gala Romney- L side diverticula, inflammatory  colon polyps  . Esophagogastroduodenoscopy  02/12/2004    Dr. Dudley Major- normal   . Coronary stent placement  01/2011    2.5x60mm Xience drug-eluting stent in ramus intermedius/OMI vessel, 2.5x52mm Promus Element stent in mid LAD artery  . 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 Multiple colonic  polyps removed as described above. Sigmoid Diverticulosis/ 1.5 x 2 cm relatively flat ulcerated lesion in cecum/path showed adenocarcinoma of the colon. descending colon polyp with tubular adenoma and one fragment of focal high grade dysplasia.   . Knee surgery      left knee   . Partial colectomy  02/29/2012    Procedure: PARTIAL COLECTOMY;  Surgeon: Adin Hector, MD;  Location: [facility];  Service: General;  Laterality: Right;  . Vasectomy    . Colonoscopy  08/28/2012    MPN:[id number] proctitis. Colonic diverticulosis/Ulcerations at the surgical anastomosis  path: ulcerated colonic mucosa with prolapse changes, tubular adenoma.   . Cardiac catheterization  02/13/2011  [facility], [facility] cardiology  . Cataract extraction      2011  . Colonoscopy N/A 08/02/2013    Procedure: COLONOSCOPY;  Surgeon: Daneil Dolin, MD;  Location: [facility];  Service: Endoscopy;  Laterality: N/A;  1200  . Cataract extraction w/phaco Right 08/06/2013    Procedure: CATARACT EXTRACTION PHACO AND INTRAOCULAR LENS PLACEMENT (IOC);  Surgeon: Tonny Branch, MD;  Location: [facility] ORS;  Service: Ophthalmology;  Laterality: Right;  CDE:20.96  . Coronary angioplasty  06/17/14    OM1 POBA  . Left heart catheterization with coronary angiogram N/A 06/17/2014    Procedure: LEFT HEART CATHETERIZATION WITH CORONARY ANGIOGRAM;  Surgeon: Burnell Blanks, MD;  Location: [facility] CATH LAB;   Service: Cardiovascular;  Laterality: N/A;    There were no vitals filed for this visit.  Visit Diagnosis:  Poor balance  Weakness of both legs  Proximal muscle weakness  Poor posture  History of recent fall      Subjective Assessment - 12/26/14 1619    Subjective Pt states he's just returning from a 10 day trip to [place] to visit his son.  STates he did not do his exercises, however did alot of walking and actvities on unlevel surfaces.  Currently without pain.   Currently in Pain? No/denies                         [facility] Adult PT Treatment/Exercise - 12/26/14 1622    Knee/Hip Exercises: Stretches   Active Hamstring Stretch 3 reps;30 seconds;Limitations   Active Hamstring Stretch Limitations 14"    Gastroc Stretch 3 reps;30 seconds   Gastroc Stretch Limitations slant board   Knee/Hip Exercises: Aerobic   Stationary Bike nustep hills #3, level 4 10 minutes LE only   Knee/Hip Exercises: Standing   Heel Raises 10 reps   Heel Raises Limitations toeraises 10 reps   Knee Flexion Both;10 reps   Forward Lunges Both;10 reps   Forward Lunges Limitations 6" step   Lateral Step Up Both;10 reps;Step Height: 4";Hand Hold: 1   Forward Step Up Both;10 reps;Step Height: 4";Hand Hold: 1   Functional Squat 10 reps   Functional Squat Limitations 3D hip excurisions   Rocker Board 2 minutes   Rocker Board Limitations A/P Rt/Lt                PT Education - 12/26/14 1716    Education provided Yes   Education Details Pt given written copy of initial evaluation with explanation of measurements and goals.   Person(s) Educated Patient   Methods Explanation;Handout   Comprehension Verbalized understanding          PT Short Term Goals - 12/11/14 1021    PT SHORT TERM GOAL #1   Title Patient will improve his score on the Berg balance test to at least 53 in order to reduce overall fall risk    Time 4   Period Weeks   Status New   PT SHORT TERM GOAL #2    Title Patient will muscle strength of all tested muscles in bilateral lower extremities to at least 4/5 in order to improve overall stability and reduce fall risk   Time 4   Period Weeks   Status New   PT SHORT TERM GOAL #3   Title Patient will improve bilateral hip ER to at least 40 degrees, bilateral hip IR to at least 40 degrees, and bilateral ankle dorsiflexion to 20 degrees  in order to improve overall  mechanics and balance reaction skills   Time 4   Period Weeks   Status New   PT SHORT TERM GOAL #4   Title Patient will be able to ambulate over an uneven, unpredictable surface for at least 147ft with no balance loss, no ankle injury, no assistive device, and no falls reported    Time 4   Period Weeks   Status New           PT Long Term Goals - 12/11/14 1024    PT LONG TERM GOAL #1   Title Patient will be independent in correctly and consistently performing appropriate advanced HEP    Time 8   Period Weeks   Status New   PT LONG TERM GOAL #2   Title Patient will be able to perform single leg stance for at least 30 seconds with bilateral lower extremities, and will be able to maintain tandem stance for at least 60 seconds without loss of balance    Time 8   Period Weeks   Status New   PT LONG TERM GOAL #3   Title Patient will report that he is able to do his daily tasks and work around his property without fear of falling, also with no ankle injuries or falls reported    Time 8   Period Weeks   Status New   PT LONG TERM GOAL #4   Title Patient will demonstrate at least 4+/5 strength in bilateral lower extremities and proximal muscles in order to enhance overall stability and reduce fall risk   Time 8   Period Weeks   Status New               Plan - 12/26/14 1720    Clinical Impression Statement initiated new therex per POC.  PT without difficulty or complaints other than some discomfort with lunge to 4" step.  Able to complete without pain onto 6" step.  Pt  still has not heard test results from his agent orange.  Pt instructed to resume  his HEP as able.  Pt given written copy of initial evaluation.   Pt will benefit from skilled therapeutic intervention in order to improve on the following deficits --  impaired proprioception   PT Next Visit Plan Functional stretching and strengthening, balance tasks, gait training, ankle strength/range.  Work on gait quality next visit.  Progress as able.    PT Home Exercise Plan ankle DF/PF/inversion/eversion, standing hip ABD, standing marches with 2 second hold    Consulted and Agree with Plan of Care Patient        Problem List Patient Active Problem List   Diagnosis Date Noted  . H/O prostate cancer 08/20/2014  . Radiation proctitis 08/19/2014  . H/O agent Orange exposure 07/01/2014  . Unstable angina 06/16/2014  . Shingles outbreak, right anterior thoracic 01/28/2014  . Abnormality of gait 12/18/2013  . Viral gastroenteritis 11/26/2013  . Dyslipidemia 02/12/2013  . Peripheral neuropathy 10/14/2012  . CAD (coronary artery disease) 10/14/2012  . HTN (hypertension) 10/14/2012  . Hx of adenomatous colonic polyps 07/26/2012  . Folic acid deficiency 62/37/6283  . Iron deficiency 05/02/2012  . Cancer of ascending colon 03/29/2012  . GERD 03/09/2010  . PREMATURE VENTRICULAR CONTRACTIONS 04/30/2009   Teena Irani, PTA/CLT [phone] 12/26/2014, 5:27 PM  [place] [address] [place], [place], [address] Phone: [phone]   Fax:  [phone]

## 2014-12-31 ENCOUNTER — Ambulatory Visit (HOSPITAL_COMMUNITY): Payer: Medicare Other | Attending: Rheumatology

## 2014-12-31 DIAGNOSIS — Z9181 History of falling: Secondary | ICD-10-CM | POA: Diagnosis not present

## 2014-12-31 DIAGNOSIS — R293 Abnormal posture: Secondary | ICD-10-CM | POA: Insufficient documentation

## 2014-12-31 DIAGNOSIS — R2689 Other abnormalities of gait and mobility: Secondary | ICD-10-CM | POA: Diagnosis not present

## 2014-12-31 DIAGNOSIS — M6281 Muscle weakness (generalized): Secondary | ICD-10-CM

## 2014-12-31 DIAGNOSIS — R29898 Other symptoms and signs involving the musculoskeletal system: Secondary | ICD-10-CM | POA: Diagnosis not present

## 2014-12-31 NOTE — Therapy (Signed)
[place], [place], [address] Phone: [phone]   Fax:  [phone]  Physical Therapy Treatment  Patient Details  Name: David Pugh MRN: [id number] Date of Birth: [birth year] Referring Provider:  [name]., MD  Encounter Date: 12/31/2014      PT End of Session - 12/31/14 1601    Visit Number 3   Number of Visits 16   Date for PT Re-Evaluation 01/08/15   Authorization Type [employer]    Authorization Time Period 12/11/14 to 02/10/15   Authorization - Visit Number 3   Authorization - Number of Visits 10   PT Start Time 0175   PT Stop Time 1650   PT Time Calculation (min) 55 min   Activity Tolerance Patient tolerated treatment well   Behavior During Therapy [facility] for tasks assessed/performed      Past Medical History  Diagnosis Date  . Prostate cancer 2010    s/p radiation, surgery  . Radiation Feb 2011    treatment  . Biliary dyskinesia   . GERD (gastroesophageal reflux disease)   . Hiatal hernia 2010    tiny  . HTN (hypertension)   . Rosacea   . PTSD (post-traumatic stress disorder)   . Diverticula of colon 2010  . Colon polyps     tubular adenomas  . Arthritis   . Exposure to [employer]   . CAD (coronary artery disease) 2012    a. hx of stent x2 in ramus/OM and mid LAD in 2012  b. balloon angioplaty of OM  . Peripheral neuropathy   . Anemia     hx of anemia as a child   . Colon cancer 12/2011    s/p right hemicolectomy, did not tolerate chemo  . Iron deficiency 05/02/2012    followed by Dr. Tressie [name], iron infusions.   . Folic acid deficiency   . Heart palpitations 10/2012  . Gastroparesis 04/12/2013    borderline  . Myocardial infarction 2012  . Glaucoma   . Abnormality of gait 12/18/2013  . Radiation proctitis     Past Surgical History  Procedure Laterality Date  . Cholecystectomy    . Prostatectomy    . Tonsillectomy    . Transurethral resection of prostate    .  Esophagogastroduodenoscopy  10/10/2008    Dr. Tilda Burrow hiatal hernia, normal esophagus, normal stomach  . Colonoscopy  02/12/2004    Dr. Gala Romney- L side diverticula, inflammatory  colon polyps  . Esophagogastroduodenoscopy  02/12/2004    Dr. Dudley Major- normal   . Coronary stent placement  01/2011    2.5x64mm Xience drug-eluting stent in ramus intermedius/OMI vessel, 2.5x59mm Promus Element stent in mid LAD artery  . 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 Multiple colonic  polyps removed as described above. Sigmoid Diverticulosis/ 1.5 x 2 cm relatively flat ulcerated lesion in cecum/path showed adenocarcinoma of the colon. descending colon polyp with tubular adenoma and one fragment of focal high grade dysplasia.   . Knee surgery      left knee   . Partial colectomy  02/29/2012    Procedure: PARTIAL COLECTOMY;  Surgeon: Adin Hector, MD;  Location: [facility];  Service: General;  Laterality: Right;  . Vasectomy    . Colonoscopy  08/28/2012    [id number] proctitis. Colonic diverticulosis/Ulcerations at the surgical anastomosis  path: ulcerated colonic mucosa with prolapse changes, tubular adenoma.   . Cardiac catheterization  02/13/2011  [facility], [facility] cardiology  . Cataract extraction      2011  . Colonoscopy N/A 08/02/2013    Procedure: COLONOSCOPY;  Surgeon: Daneil Dolin, MD;  [facility];  Service: Endoscopy;  Laterality: N/A;  1200  . Cataract extraction w/phaco Right 08/06/2013    Procedure: CATARACT EXTRACTION PHACO AND INTRAOCULAR LENS PLACEMENT (IOC);  Surgeon: Tonny Branch, MD;  Location: [facility];  Service: Ophthalmology;  Laterality: Right;  CDE:20.96  . Coronary angioplasty  06/17/14    OM1 POBA  . Left heart catheterization with coronary angiogram N/A 06/17/2014    Procedure: LEFT HEART CATHETERIZATION WITH CORONARY ANGIOGRAM;  Surgeon: Burnell Blanks, MD;  Location: [facility] CATH LAB;   Service: Cardiovascular;  Laterality: N/A;    There were no vitals filed for this visit.  Visit Diagnosis:  Poor balance  Weakness of both legs  Proximal muscle weakness  Poor posture  History of recent fall      Subjective Assessment - 12/31/14 1555    Subjective Pt stated he has been having his roof replaced on his house and hasn't been doing a lot of HEP.  Pt stated most difficulty is balance when walking outdoors with unlevel surfaces.   Currently in Pain? No/denies                         [facility] Adult PT Treatment/Exercise - 12/31/14 0001    Knee/Hip Exercises: Stretches   Quad Stretch 3 reps;30 seconds   Gastroc Stretch 3 reps;30 seconds   Gastroc Stretch Limitations slant board   Knee/Hip Exercises: Aerobic   Stationary Bike nustep hills #4, level 4 10 minutes LE only   Knee/Hip Exercises: Standing   Heel Raises 2 sets;10 reps   Heel Raises Limitations toe raises 2x 10   Forward Lunges Both;10 reps   Forward Lunges Limitations 6" step   Side Lunges Both;10 reps   Side Lunges Limitations 6in step   Lateral Step Up Both;10 reps;Step Height: 4";Hand Hold: 1   Forward Step Up Both;10 reps;Hand Hold: 1;Step Height: 6"   Functional Squat 10 reps   Functional Squat Limitations 3D hip excurisions   Rocker Board 2 minutes   Rocker Board Limitations A/P Rt/Lt   SLS Lt 6" Rt 4" max of 5 attempts   Gait Training Gait drill 1RT around dept including exaggerated heel to toe, hamstring curls, high marching and normalized heel to toe gait   Other Standing Knee Exercises Tandem stance 5x30" each LE with intermittent HHA                  PT Short Term Goals - 12/31/14 1715    PT SHORT TERM GOAL #1   Title Patient will improve his score on the Berg balance test to at least 53 in order to reduce overall fall risk    Status On-going   PT SHORT TERM GOAL #2   Title Patient will muscle strength of all tested muscles in bilateral lower extremities to at  least 4/5 in order to improve overall stability and reduce fall risk   Status On-going   PT SHORT TERM GOAL #3   Title Patient will improve bilateral hip ER to at least 40 degrees, bilateral hip IR to at least 40 degrees, and bilateral ankle dorsiflexion to 20 degrees  in order to improve overall mechanics and balance reaction skills   Status On-going   PT SHORT TERM GOAL #4   Title Patient will  be able to ambulate over an uneven, unpredictable surface for at least 177ft with no balance loss, no ankle injury, no assistive device, and no falls reported            PT Long Term Goals - 12/31/14 1715    PT LONG TERM GOAL #1   Title Patient will be independent in correctly and consistently performing appropriate advanced HEP    Status On-going   PT LONG TERM GOAL #2   Title Patient will be able to perform single leg stance for at least 30 seconds with bilateral lower extremities, and will be able to maintain tandem stance for at least 60 seconds without loss of balance    Status On-going   PT LONG TERM GOAL #3   Title Patient will report that he is able to do his daily tasks and work around his property without fear of falling, also with no ankle injuries or falls reported    PT LONG TERM GOAL #4   Title Patient will demonstrate at least 4+/5 strength in bilateral lower extremities and proximal muscles in order to enhance overall stability and reduce fall risk               Plan - 12/31/14 1710    Clinical Impression Statement Continued session focus on improving LE strengthening, added gait quality exercises and balance activities.  Pt with min assistance required to LOB during tandem stance and cueing to improve focal point and improve posture.  Noted gluteal weakness with balance activities, added side lunges with cueing for proper loading.  Pt c/o quad tightness, added stretch to resolve tightness.  Ended session with Nustep to improve activity tolerance, pt able to achieve goal  >100 SPM with minimal difficulty.     PT Next Visit Plan Functional stretching and strengthening, balance tasks, gait training, ankle strength/range.   Continue working on gait quality next visit.  Increase Nustep hill level to 5.  Begin tandem, retro and sidestepping next session, progress to dynamic surface as able.          Problem List Patient Active Problem List   Diagnosis Date Noted  . H/O prostate cancer 08/20/2014  . Radiation proctitis 08/19/2014  . H/O agent Orange exposure 07/01/2014  . Unstable angina 06/16/2014  . Shingles outbreak, right anterior thoracic 01/28/2014  . Abnormality of gait 12/18/2013  . Viral gastroenteritis 11/26/2013  . Dyslipidemia 02/12/2013  . Peripheral neuropathy 10/14/2012  . CAD (coronary artery disease) 10/14/2012  . HTN (hypertension) 10/14/2012  . Hx of adenomatous colonic polyps 07/26/2012  . Folic acid deficiency 16/83/7290  . Iron deficiency 05/02/2012  . Cancer of ascending colon 03/29/2012  . GERD 03/09/2010  . PREMATURE VENTRICULAR CONTRACTIONS 04/30/2009   Ihor Austin, [place]; [place] #[address] [phone]   Aldona Lento 12/31/2014, 5:17 PM  [place] [address] [place], [place], [address] Phone: [phone]   Fax:  [phone]

## 2015-01-02 ENCOUNTER — Ambulatory Visit (HOSPITAL_COMMUNITY): Payer: Medicare Other

## 2015-01-02 DIAGNOSIS — R2689 Other abnormalities of gait and mobility: Secondary | ICD-10-CM

## 2015-01-02 DIAGNOSIS — Z9181 History of falling: Secondary | ICD-10-CM

## 2015-01-02 DIAGNOSIS — R29898 Other symptoms and signs involving the musculoskeletal system: Secondary | ICD-10-CM

## 2015-01-02 DIAGNOSIS — M6281 Muscle weakness (generalized): Secondary | ICD-10-CM

## 2015-01-02 DIAGNOSIS — R293 Abnormal posture: Secondary | ICD-10-CM

## 2015-01-02 NOTE — Therapy (Signed)
[place], [place], [address] Phone: [phone]   Fax:  [phone]  Physical Therapy Treatment  Patient Details  Name: David Pugh MRN: [id number] Date of Birth: 1934/12/06 Referring Provider:  [name]., MD  Encounter Date: 01/02/2015      PT End of Session - 01/02/15 1632    Visit Number 4   Number of Visits 16   Date for PT Re-Evaluation 01/08/15   Authorization Type [employer]    Authorization Time Period 12/11/14 to 02/10/15   Authorization - Visit Number 4   Authorization - Number of Visits 10   PT Start Time 1950   PT Stop Time 1655   PT Time Calculation (min) 58 min   Equipment Utilized During Treatment Gait belt   Activity Tolerance Patient tolerated treatment well   Behavior During Therapy [facility] for tasks assessed/performed      Past Medical History  Diagnosis Date  . Prostate cancer 2010    s/p radiation, surgery  . Radiation Feb 2011    treatment  . Biliary dyskinesia   . GERD (gastroesophageal reflux disease)   . Hiatal hernia 2010    tiny  . HTN (hypertension)   . Rosacea   . PTSD (post-traumatic stress disorder)   . Diverticula of colon 2010  . Colon polyps     tubular adenomas  . Arthritis   . Exposure to [employer]   . CAD (coronary artery disease) 2012    a. hx of stent x2 in ramus/OM and mid LAD in 2012  b. balloon angioplaty of OM  . Peripheral neuropathy   . Anemia     hx of anemia as a child   . Colon cancer 12/2011    s/p right hemicolectomy, did not tolerate chemo  . Iron deficiency 05/02/2012    followed by Dr. Tressie [name], iron infusions.   . Folic acid deficiency   . Heart palpitations 10/2012  . Gastroparesis 04/12/2013    borderline  . Myocardial infarction 2012  . Glaucoma   . Abnormality of gait 12/18/2013  . Radiation proctitis     Past Surgical History  Procedure Laterality Date  . Cholecystectomy    . Prostatectomy    . Tonsillectomy    .  Transurethral resection of prostate    . Esophagogastroduodenoscopy  10/10/2008    Dr. Tilda Burrow hiatal hernia, normal esophagus, normal stomach  . Colonoscopy  02/12/2004    Dr. Gala Romney- L side diverticula, inflammatory  colon polyps  . Esophagogastroduodenoscopy  02/12/2004    Dr. Dudley Major- normal   . Coronary stent placement  01/2011    2.5x7mm Xience drug-eluting stent in ramus intermedius/OMI vessel, 2.5x44mm Promus Element stent in mid LAD artery  . 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 Multiple colonic  polyps removed as described above. Sigmoid Diverticulosis/ 1.5 x 2 cm relatively flat ulcerated lesion in cecum/path showed adenocarcinoma of the colon. descending colon polyp with tubular adenoma and one fragment of focal high grade dysplasia.   . Knee surgery      left knee   . Partial colectomy  02/29/2012    Procedure: PARTIAL COLECTOMY;  Surgeon: Adin Hector, MD;  Location: [facility];  Service: General;  Laterality: Right;  . Vasectomy    . Colonoscopy  08/28/2012    DTO:IZTIWPYKD proctitis. Colonic diverticulosis/Ulcerations at the surgical anastomosis  path: ulcerated colonic mucosa with prolapse changes, tubular  adenoma.   . Cardiac catheterization  02/13/2011    [facility] cardiology  . Cataract extraction      2011  . Colonoscopy N/A 08/02/2013    Procedure: COLONOSCOPY;  Surgeon: Daneil Dolin, MD;  [facility]: [facility];  Service: Endoscopy;  Laterality: N/A;  1200  . Cataract extraction w/phaco Right 08/06/2013    Procedure: CATARACT EXTRACTION PHACO AND INTRAOCULAR LENS PLACEMENT (IOC);  Surgeon: Tonny Branch, MD;  Location: [facility];  Service: Ophthalmology;  Laterality: Right;  CDE:20.96  . Coronary angioplasty  06/17/14    OM1 POBA  . Left heart catheterization with coronary angiogram N/A 06/17/2014    Procedure: LEFT HEART CATHETERIZATION WITH CORONARY ANGIOGRAM;  Surgeon: Burnell Blanks, MD;  Location: [facility] CATH LAB;  Service: Cardiovascular;  Laterality: N/A;    There were no vitals filed for this visit.  Visit Diagnosis:  Poor balance  Weakness of both legs  Proximal muscle weakness  Poor posture  History of recent fall      Subjective Assessment - 01/02/15 1554    Subjective Pt stated he has been doing more HEP and riding elliptical at home.  Stated tandem stance most difficult exercise.  No reports of pain today.            [facility] PT Assessment - 01/02/15 0001    Assessment   Medical Diagnosis balance deficits    Onset Date 12/11/10   Next MD Visit [place] July 2016[name] 01/03/2015               [facility] PT Treatment/Exercise - 01/02/15 0001    Knee/Hip Exercises: Stretches   Active Hamstring Stretch 3 reps;30 seconds;Limitations   Active Hamstring Stretch Limitations 14"    Quad Stretch 3 reps;30 seconds   Piriformis Stretch 2 reps;30 seconds   Piriformis Stretch Limitations supine figure 4   Gastroc Stretch 3 reps;30 seconds   Gastroc Stretch Limitations slant board   Knee/Hip Exercises: Aerobic   Stationary Bike nustep hills #5, level 4 10 minutes LE only   Knee/Hip Exercises: Standing   Forward Lunges Both;15 reps   Forward Lunges Limitations 6" step   Lateral Step Up Both;15 reps;Hand Hold: 1;Step Height: 6"   Forward Step Up Both;15 reps;Hand Hold: 1;Step Height: 6"   Functional Squat 15 reps   Functional Squat Limitations 3D hip excurisions   SLS Lt 8", Rt 3" max of 5 attempts   Gait Training Gait training with min cueing for arm swing with opposite LE for more normalized gait mechanics.   Other Standing Knee Exercises Tandem and retro gait, sidestepping with RTB 2RT             PT Short Term Goals - 01/02/15 1633    PT SHORT TERM GOAL #1   Title Patient will improve his score on the Berg balance test to at least 53 in order to reduce overall fall risk    Status On-going   PT SHORT TERM GOAL #2    Title Patient will muscle strength of all tested muscles in bilateral lower extremities to at least 4/5 in order to improve overall stability and reduce fall risk   Status On-going   PT SHORT TERM GOAL #3   Title Patient will improve bilateral hip ER to at least 40 degrees, bilateral hip IR to at least 40 degrees, and bilateral ankle dorsiflexion to 20 degrees  in order to improve overall mechanics and balance reaction skills   PT SHORT  TERM GOAL #4   Title Patient will be able to ambulate over an uneven, unpredictable surface for at least 150ft with no balance loss, no ankle injury, no assistive device, and no falls reported            PT Long Term Goals - 01/02/15 1634    PT LONG TERM GOAL #1   Title Patient will be independent in correctly and consistently performing appropriate advanced HEP    Status On-going   PT LONG TERM GOAL #2   Title Patient will be able to perform single leg stance for at least 30 seconds with bilateral lower extremities, and will be able to maintain tandem stance for at least 60 seconds without loss of balance    Status On-going   PT LONG TERM GOAL #3   Title Patient will report that he is able to do his daily tasks and work around his property without fear of falling, also with no ankle injuries or falls reported    PT LONG TERM GOAL #4   Title Patient will demonstrate at least 4+/5 strength in bilateral lower extremities and proximal muscles in order to enhance overall stability and reduce fall risk               Plan - 01/02/15 1634    Clinical Impression Statement Progressed balance activiites with tandem, sidestepping and retro gait with min assistance and cueing to improve spatial awareness to assist with LOB episodes.  Pt improving gait mechanics with min cueing required this session to improve arm swing with opposite LE and for posture.  Added piriformis stretches to improve hip mobility.  Pt demonstrated  appropriate techniques with all LE  strengthening exercises following demostration and cueing for technique, able to increase height with stair training with minimal difficulty.  No reports of pain through session.  Increased resistance with Nustep for increased demand with activity tolerance, included UE this session to assist with coordination, pt able to keep SPM >100   PT Next Visit Plan Functional stretching and strengthening, balance tasks, gait training, ankle strength/range.   Continue working on gait quality next visit.  Increase Nustep hill level to 5.  Continue tandem, retro and sidestepping next session, progress to dynamic surface as able.          Problem List Patient Active Problem List   Diagnosis Date Noted  . H/O prostate cancer 08/20/2014  . Radiation proctitis 08/19/2014  . H/O agent Orange exposure 07/01/2014  . Unstable angina 06/16/2014  . Shingles outbreak, right anterior thoracic 01/28/2014  . Abnormality of gait 12/18/2013  . Viral gastroenteritis 11/26/2013  . Dyslipidemia 02/12/2013  . Peripheral neuropathy 10/14/2012  . CAD (coronary artery disease) 10/14/2012  . HTN (hypertension) 10/14/2012  . Hx of adenomatous colonic polyps 07/26/2012  . Folic acid deficiency 95/18/8416  . Iron deficiency 05/02/2012  . Cancer of ascending colon 03/29/2012  . GERD 03/09/2010  . PREMATURE VENTRICULAR CONTRACTIONS 04/30/2009   Ihor Austin, [place]; [place] #[id number] [phone]  Aldona Lento 01/02/2015, 5:17 PM  [facility] [address] [place], [place], [address] Phone: [phone]   Fax:  [phone]

## 2015-01-03 ENCOUNTER — Encounter (HOSPITAL_BASED_OUTPATIENT_CLINIC_OR_DEPARTMENT_OTHER): Payer: Medicare Other | Admitting: Hematology & Oncology

## 2015-01-03 ENCOUNTER — Encounter (HOSPITAL_COMMUNITY): Payer: Self-pay | Admitting: Hematology & Oncology

## 2015-01-03 ENCOUNTER — Encounter (HOSPITAL_BASED_OUTPATIENT_CLINIC_OR_DEPARTMENT_OTHER): Payer: Medicare Other

## 2015-01-03 ENCOUNTER — Encounter (HOSPITAL_COMMUNITY): Payer: Medicare Other | Attending: Cardiovascular Disease

## 2015-01-03 ENCOUNTER — Ambulatory Visit (HOSPITAL_COMMUNITY): Payer: TRICARE For Life (TFL) | Admitting: Hematology & Oncology

## 2015-01-03 VITALS — BP 132/74 | HR 68 | Temp 99.3°F | Resp 16 | Wt 227.7 lb

## 2015-01-03 VITALS — BP 131/67 | HR 63 | Temp 98.1°F | Resp 20

## 2015-01-03 DIAGNOSIS — Z5189 Encounter for other specified aftercare: Secondary | ICD-10-CM | POA: Insufficient documentation

## 2015-01-03 DIAGNOSIS — C9110 Chronic lymphocytic leukemia of B-cell type not having achieved remission: Secondary | ICD-10-CM | POA: Diagnosis not present

## 2015-01-03 DIAGNOSIS — D500 Iron deficiency anemia secondary to blood loss (chronic): Secondary | ICD-10-CM

## 2015-01-03 DIAGNOSIS — Z9861 Coronary angioplasty status: Secondary | ICD-10-CM | POA: Insufficient documentation

## 2015-01-03 DIAGNOSIS — C182 Malignant neoplasm of ascending colon: Secondary | ICD-10-CM | POA: Diagnosis not present

## 2015-01-03 DIAGNOSIS — E611 Iron deficiency: Secondary | ICD-10-CM | POA: Diagnosis not present

## 2015-01-03 DIAGNOSIS — Z8546 Personal history of malignant neoplasm of prostate: Secondary | ICD-10-CM | POA: Diagnosis not present

## 2015-01-03 DIAGNOSIS — C189 Malignant neoplasm of colon, unspecified: Secondary | ICD-10-CM

## 2015-01-03 MED ORDER — SODIUM CHLORIDE 0.9 % IV SOLN
510.0000 mg | Freq: Once | INTRAVENOUS | Status: AC
  Administered 2015-01-03: 510 mg via INTRAVENOUS
  Filled 2015-01-03: qty 17

## 2015-01-03 MED ORDER — SODIUM CHLORIDE 0.9 % IV SOLN
Freq: Once | INTRAVENOUS | Status: AC
  Administered 2015-01-03: 15:00:00 via INTRAVENOUS

## 2015-01-03 NOTE — Progress Notes (Addendum)
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 R hemicolectomy. Inability to tolerate adjuvant XELODA 02/29/2012 History of prostate cancer. TURP X 2, XRT at [facility] in 2011  Chronic lymphocytic leukemia, flow cytometry on 12/13/2014 with a small monoclonal B-cell population CD5+, CD23+ RAI clinical stage 0 CT C/A/P 06/13/2014 no findings to suggest recurrent or metastatic disease in the abdomen/pelvis  CURRENT THERAPY: Observation   INTERVAL HISTORY: David Pugh 80 y.o. male with a history of CAD, Stage III CRC, prostate cancer who is doing well. He is here today for regular follow-up. He has recently been started on flomax and reports urinary frequency. He is up to date with his screening colonoscopy.    He is here today for review of peripheral flow cytometry. He has had an intermittent lymphocytosis on CBCs, and smudge cells noted on a peripheral smear review.   He also has questions today about why he developed severe diarrhea when he was treated for his colon cancer. He states he was unable to tolerate the chemotherapy. He has a cousin who has been diagnosed with gastric cancer who also cannot tolerate the same treatment for the same symptoms.  Past Medical History  Diagnosis Date  . Prostate cancer 2010    s/p radiation, surgery  . Radiation Feb 2011    treatment  . Biliary dyskinesia   . GERD (gastroesophageal reflux disease)   . Hiatal hernia 2010    tiny  . HTN (hypertension)   . Rosacea   . PTSD (post-traumatic stress disorder)   . Diverticula of colon 2010  . Colon polyps     tubular adenomas  . Arthritis   . Exposure to [employer]   . CAD (coronary artery disease) 2012    a. hx of stent x2 in ramus/OM and mid LAD in 2012  b. balloon angioplaty of OM  . Peripheral neuropathy   . Anemia     hx of anemia as a child   . Colon cancer 12/2011    s/p right hemicolectomy, did not tolerate chemo  . Iron  deficiency 05/02/2012    followed by Dr. Tressie [name], iron infusions.   . Folic acid deficiency   . Heart palpitations 10/2012  . Gastroparesis 04/12/2013    borderline  . Myocardial infarction 2012  . Glaucoma   . Abnormality of gait 12/18/2013  . Radiation proctitis     has PREMATURE VENTRICULAR CONTRACTIONS; GERD; Cancer of ascending colon; Iron deficiency; Hx of adenomatous colonic polyps; Folic acid deficiency; Peripheral neuropathy; CAD (coronary artery disease); HTN (hypertension); Dyslipidemia; Viral gastroenteritis; Abnormality of gait; Shingles outbreak, right anterior thoracic; Unstable angina; H/O agent Orange exposure; Radiation proctitis; and H/O prostate cancer on his problem list.      Cancer of ascending colon   02/29/2012 Procedure laparoscopic assisted R colectomy   03/09/2012 Miscellaneous Iron deficiency and folic acid deficiency   03/29/2012 Initial Diagnosis Cancer of ascending colon, CEA on 01/19/2012 was WNL   08/02/2013 Procedure Ileocolonoscopy with snare polypectomy   06/11/2014 Imaging CT abdomen/pelvis with no findings to suggest metastatic disease. Diverticulosis   History   Social History  . Marital Status: Married    Spouse Name: N/A  . Number of Children: 3  . Years of Education: military   Occupational History  . Retired   .     Social History Main Topics  . Smoking status: Never Smoker   . Smokeless  tobacco: Never Used  . Alcohol Use: Yes     Comment: 1 cocktail/month  . Drug Use: No  . Sexual Activity: Yes    Birth Control/ Protection: None   Other Topics Concern  . None   Social History Narrative   3 tumors in [place]  Family History  Problem Relation Age of Onset  . Throat cancer Father   . Cancer Father     Throat  . Prostate cancer Brother   . Cancer Brother     Prostate and Skin  . Neuropathy Brother   . Colon cancer Neg Hx      is allergic to shellfish allergy; sulfa antibiotics; and sulfonamide derivatives.  David Pugh  does not currently have medications on file.  Past Surgical History  Procedure Laterality Date  . Cholecystectomy    . Prostatectomy    . Tonsillectomy    . Transurethral resection of prostate    . Esophagogastroduodenoscopy  10/10/2008    Dr. Tilda Burrow hiatal hernia, normal esophagus, normal stomach  . Colonoscopy  02/12/2004    Dr. Gala Romney- L side diverticula, inflammatory  colon polyps  . Esophagogastroduodenoscopy  02/12/2004    Dr. Dudley Major- normal   . Coronary stent placement  01/2011    2.5x38mm Xience drug-eluting stent in ramus intermedius/OMI vessel, 2.5x20mm Promus Element stent in mid LAD artery  . 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 Multiple colonic  polyps removed as described above. Sigmoid Diverticulosis/ 1.5 x 2 cm relatively flat ulcerated lesion in cecum/path showed adenocarcinoma of the colon. descending colon polyp with tubular adenoma and one fragment of focal high grade dysplasia.   . Knee surgery      left knee   . Partial colectomy  02/29/2012    Procedure: PARTIAL COLECTOMY;  Surgeon: Adin Hector, MD;  Location: [facility];  Service: General;  Laterality: Right;  . Vasectomy    . Colonoscopy  08/28/2012    JXB:[id number] proctitis. Colonic diverticulosis/Ulcerations at the surgical anastomosis  path: ulcerated colonic mucosa with prolapse changes, tubular adenoma.   . Cardiac catheterization  02/13/2011    [facility] cardiology  . Cataract extraction      2011  . Colonoscopy N/A 08/02/2013    Procedure: COLONOSCOPY;  Surgeon: Daneil Dolin, MD;  Location: [facility];  Service: Endoscopy;  Laterality: N/A;  1200  . Cataract extraction w/phaco Right 08/06/2013    Procedure: CATARACT EXTRACTION PHACO AND INTRAOCULAR LENS PLACEMENT (IOC);  Surgeon: Tonny Branch, MD;  Location: [facility];  Service: Ophthalmology;  Laterality: Right;  CDE:20.96  . Coronary angioplasty  06/17/14    OM1  POBA  . Left heart catheterization with coronary angiogram N/A 06/17/2014    Procedure: LEFT HEART CATHETERIZATION WITH CORONARY ANGIOGRAM;  Surgeon: Burnell Blanks, MD;  Location: [facility] CATH LAB;  Service: Cardiovascular;  Laterality: N/A;    Review of Systems  Constitutional: Negative.   HENT: Negative.   Eyes: Negative.        Follows routinely with an eye doctor  Respiratory: Negative.   Cardiovascular: Negative.        Just had an angioplasty now on plavix  Gastrointestinal: Negative.   Genitourinary: Positive for urgency and frequency. Negative for dysuria, hematuria and flank pain.  Musculoskeletal:       Knee pain and weakness Has OA in both knees  Skin: Negative.   Neurological: Positive for tingling. Negative for dizziness, tremors, sensory change, speech  change, focal weakness and seizures.       BLE neuropathy, worsening and follows with neurology  Endo/Heme/Allergies: Negative.   Psychiatric/Behavioral: Negative.     PHYSICAL EXAMINATION ECOG PERFORMANCE STATUS: 0 - Asymptomatic  There were no vitals filed for this visit.  Physical Exam  Constitutional: He is oriented to person, place, and time and well-developed, well-nourished, and in no distress. No distress.  HENT:  Head: Normocephalic.  Mouth/Throat: Oropharynx is clear and moist. No oropharyngeal exudate.  Eyes: Conjunctivae and EOM are normal. Pupils are equal, round, and reactive to light. No scleral icterus.  Neck: Normal range of motion. Neck supple. No JVD present. No tracheal deviation present. No thyromegaly present.  Cardiovascular: Normal rate, regular rhythm and normal heart sounds.   No murmur heard. Pulmonary/Chest: Effort normal and breath sounds normal. No respiratory distress. He has no wheezes. He has no rales. He exhibits no tenderness.  Abdominal: Soft. Bowel sounds are normal. He exhibits no distension and no mass. There is no tenderness. There is no rebound and no guarding.    Musculoskeletal: Normal range of motion. He exhibits no edema.  Lymphadenopathy:    He has no cervical adenopathy.  Neurological: He is alert and oriented to person, place, and time. No cranial nerve deficit. Coordination normal.  Skin: Skin is warm and dry. He is not diaphoretic.  Psychiatric: Mood, memory, affect and judgment normal.    LABORATORY DATA: CBC    Component Value Date/Time   WBC 11.5* 12/13/2014 0941   RBC 4.69 12/13/2014 0941   HGB 14.3 12/13/2014 0941   HCT 43.6 12/13/2014 0941   PLT 295 12/13/2014 0941   MCV 93.0 12/13/2014 0941   MCH 30.5 12/13/2014 0941   MCHC 32.8 12/13/2014 0941   RDW 13.4 12/13/2014 0941   LYMPHSABS 4.0 12/13/2014 0941   MONOABS 1.0 12/13/2014 0941   EOSABS 0.6 12/13/2014 0941   BASOSABS 0.0 12/13/2014 0941    CMP     Component Value Date/Time   NA 142 12/13/2014 0941   K 4.3 12/13/2014 0941   CL 109 12/13/2014 0941   CO2 27 12/13/2014 0941   GLUCOSE 95 12/13/2014 0941   BUN 20 12/13/2014 0941   CREATININE 1.28 12/13/2014 0941   CALCIUM 9.1 12/13/2014 0941   PROT 6.6 12/13/2014 0941   PROT 6.4 12/18/2013 0941   ALBUMIN 3.7 12/13/2014 0941   AST 20 12/13/2014 0941   ALT 20 12/13/2014 0941   ALKPHOS 53 12/13/2014 0941   BILITOT 0.8 12/13/2014 0941   GFRNONAA 52* 12/13/2014 0941   GFRAA 61* 12/13/2014 0941     THERAPY PLAN:   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 R hemicolectomy. Inability to tolerate adjuvant XELODA 02/29/2012 History of prostate cancer. TURP X 2, XRT at [facility] in 2011  Chronic lymphocytic leukemia, flow cytometry on 12/13/2014 with a small monoclonal B-cell population CD5+, CD23+ RAI clinical stage 0  I spent time in discussion today with the patient and his wife about CLL. He is completely asymptomatic and his blood counts are all preserved. I discussed treatment options for CLL including the watch and wait approach. I have given them several sources of information including reading material from  the leukemia and lymphoma society.  Based upon the fact his counts are normal and he is asymptomatic I would only recommend observation. I have also offered them a second opinion at [facility] if they would like one.    I hope this paper chart to find a more  about his symptoms and side effects during his treatment for colon cancer. I will plan on seeing him back in 2-3 weeks to review any questions they may have regarding his CLL.  This document serves as a record of services personally performed by Ancil Linsey, MD. It was created on her behalf by Arlyce Harman, a trained medical scribe. The creation of this record is based on the scribe's personal observations and the provider's statements to them. This document has been checked and approved by the attending provider. This note was electronically signed. I have reviewed the above documentation for accuracy and completeness and I agree with the above.  Ancil Linsey, MD

## 2015-01-03 NOTE — Progress Notes (Signed)
David Pugh Tolerated iron infusion today Discharged ambulatory

## 2015-01-03 NOTE — Patient Instructions (Signed)
[place] at [facility]  Discharge Instructions:  Iron infusion today Follow up as scheduled Please call the clinic if you have any questions or concerns _______________________________________________________________  Thank you for choosing [place] at [facility] to provide your oncology and hematology care.  To afford each patient quality time with our providers, please arrive at least 15 minutes before your scheduled appointment.  You need to re-schedule your appointment if you arrive 10 or more minutes late.  We strive to give you quality time with our providers, and arriving late affects you and other patients whose appointments are after yours.  Also, if you no show three or more times for appointments you may be dismissed from the clinic.  Again, thank you for choosing [facility] [place] hope is that these requests will allow you access to exceptional care and in a timely manner. _______________________________________________________________  If you have questions after your visit, please contact our office at [phone] between the hours of 8:30 a.m. and 5:00 p.m. Voicemails left after 4:30 p.m. will not be returned until the following business day. _______________________________________________________________  For prescription refill requests, have your pharmacy contact our office. _______________________________________________________________  Recommendations made by the consultant and any test results will be sent to your referring physician. _______________________________________________________________

## 2015-01-07 ENCOUNTER — Ambulatory Visit (HOSPITAL_COMMUNITY): Payer: Medicare Other | Admitting: Physical Therapy

## 2015-01-07 DIAGNOSIS — R293 Abnormal posture: Secondary | ICD-10-CM

## 2015-01-07 DIAGNOSIS — R2689 Other abnormalities of gait and mobility: Secondary | ICD-10-CM | POA: Diagnosis not present

## 2015-01-07 DIAGNOSIS — Z9181 History of falling: Secondary | ICD-10-CM

## 2015-01-07 DIAGNOSIS — R29898 Other symptoms and signs involving the musculoskeletal system: Secondary | ICD-10-CM

## 2015-01-07 DIAGNOSIS — M6281 Muscle weakness (generalized): Secondary | ICD-10-CM

## 2015-01-07 NOTE — Therapy (Signed)
[place], [place], [address] Phone: [phone]   Fax:  [phone]  Physical Therapy Treatment  Patient Details  Name: David Pugh MRN: [id number] Date of Birth: 07-05-1935 Referring Provider:  Hurley Cisco, MD  Encounter Date: 01/07/2015      PT End of Session - 01/07/15 1628    Visit Number 5   Number of Visits 16   Date for PT Re-Evaluation 01/08/15   Authorization Type [employer]    Authorization Time Period 12/11/14 to 02/10/15   Authorization - Visit Number 5   Authorization - Number of Visits 10   PT Start Time 2778   PT Stop Time 1608   PT Time Calculation (min) 45 min   Activity Tolerance Patient tolerated treatment well   Behavior During Therapy [facility] for tasks assessed/performed      Past Medical History  Diagnosis Date  . Prostate cancer 2010    s/p radiation, surgery  . Radiation Feb 2011    treatment  . Biliary dyskinesia   . GERD (gastroesophageal reflux disease)   . Hiatal hernia 2010    tiny  . HTN (hypertension)   . Rosacea   . PTSD (post-traumatic stress disorder)   . Diverticula of colon 2010  . Colon polyps     tubular adenomas  . Arthritis   . Exposure to [employer]   . CAD (coronary artery disease) 2012    a. hx of stent x2 in ramus/OM and mid LAD in 2012  b. balloon angioplaty of OM  . Peripheral neuropathy   . Anemia     hx of anemia as a child   . Colon cancer 12/2011    s/p right hemicolectomy, did not tolerate chemo  . Iron deficiency 05/02/2012    followed by Dr. Tressie [name], iron infusions.   . Folic acid deficiency   . Heart palpitations 10/2012  . Gastroparesis 04/12/2013    borderline  . Myocardial infarction 2012  . Glaucoma   . Abnormality of gait 12/18/2013  . Radiation proctitis     Past Surgical History  Procedure Laterality Date  . Cholecystectomy    . Prostatectomy    . Tonsillectomy    . Transurethral resection of prostate    .  Esophagogastroduodenoscopy  10/10/2008    Dr. Tilda Burrow hiatal hernia, normal esophagus, normal stomach  . Colonoscopy  02/12/2004    Dr. Gala Romney- L side diverticula, inflammatory  colon polyps  . Esophagogastroduodenoscopy  02/12/2004    Dr. Dudley Major- normal   . Coronary stent placement  01/2011    2.5x23mm Xience drug-eluting stent in ramus intermedius/OMI vessel, 2.5x52mm Promus Element stent in mid LAD artery  . 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 Multiple colonic  polyps removed as described above. Sigmoid Diverticulosis/ 1.5 x 2 cm relatively flat ulcerated lesion in cecum/path showed adenocarcinoma of the colon. descending colon polyp with tubular adenoma and one fragment of focal high grade dysplasia.   . Knee surgery      left knee   . Partial colectomy  02/29/2012    Procedure: PARTIAL COLECTOMY;  Surgeon: Adin Hector, MD;  [facility]: [facility];  Service: General;  Laterality: Right;  . Vasectomy    . Colonoscopy  08/28/2012    EUM:PNTIRWERX proctitis. Colonic diverticulosis/Ulcerations at the surgical anastomosis  path: ulcerated colonic mucosa with prolapse changes, tubular adenoma.   . Cardiac catheterization  02/13/2011  [facility], [facility] cardiology  . Cataract extraction      2011  . Colonoscopy N/A 08/02/2013    Procedure: COLONOSCOPY;  Surgeon: Daneil Dolin, MD;  [facility];  Service: Endoscopy;  Laterality: N/A;  1200  . Cataract extraction w/phaco Right 08/06/2013    Procedure: CATARACT EXTRACTION PHACO AND INTRAOCULAR LENS PLACEMENT (IOC);  Surgeon: Tonny Branch, MD;  Location: [facility] ORS;  Service: Ophthalmology;  Laterality: Right;  CDE:20.96  . Coronary angioplasty  06/17/14    OM1 POBA  . Left heart catheterization with coronary angiogram N/A 06/17/2014    Procedure: LEFT HEART CATHETERIZATION WITH CORONARY ANGIOGRAM;  Surgeon: Burnell Blanks, MD;  Location: [facility] CATH LAB;   Service: Cardiovascular;  Laterality: N/A;    There were no vitals filed for this visit.  Visit Diagnosis:  Poor balance  Weakness of both legs  Proximal muscle weakness  Poor posture  History of recent fall      Subjective Assessment - 01/07/15 1525    Subjective Patient states that he had a good weekend, was very active with his grandchildren and not having any pain today. States that his Agent Orange tests have come back with not good reusults    Pertinent History Over the past 3-4 years, patient has been less and less sure on his feet; has been stumbling on level surfaces but has been able to catch himself each time. 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 Scheduled [facility] next [facility].    Currently in Pain? No/denies                         [facility] Adult PT Treatment/Exercise - 01/07/15 0001    Knee/Hip Exercises: Stretches   Active Hamstring Stretch 3 reps;30 seconds   Active Hamstring Stretch Limitations 14 inch box, 3 way    Oncologist Limitations --   Piriformis Stretch 2 reps;30 seconds   Piriformis Stretch Limitations seated   Gastroc Stretch 3 reps;30 seconds   Gastroc Stretch Limitations slant board   Knee/Hip Exercises: Aerobic   Stationary Bike Nustep seat 10, hills #3, level 5 for 10 minutes    Knee/Hip Exercises: Standing   Heel Raises 1 set;15 reps   Heel Raises Limitations toe raises on floor    Rocker Board Limitations AP x20, lateral x20 no hands    Other Standing Knee Exercises Standing hip ABD and extension with cues for form and 3 second holds at end range, 1x10             Balance Exercises - 01/07/15 1551    Balance Exercises: Standing   Tandem Gait 2 reps;Other reps (comment)  2x53ft    Retro Gait 2 reps;Other (comment)  2x25ft   Sidestepping Other (comment)  side stepping approx 2x76ft           PT Education - 01/07/15 1627     Education provided Yes   Education Details patient encouraged to take his time and move smoothly instead of rushing to assist in maintaining balance during tasks that challenge balance    Person(s) Educated Patient   Methods Explanation   Comprehension Verbalized understanding          PT Short Term Goals - 01/02/15 1633    PT SHORT TERM GOAL #1   Title Patient will improve his score on the Berg balance test  to at least 75 in order to reduce overall fall risk    Status On-going   PT SHORT TERM GOAL #2   Title Patient will muscle strength of all tested muscles in bilateral lower extremities to at least 4/5 in order to improve overall stability and reduce fall risk   Status On-going   PT SHORT TERM GOAL #3   Title Patient will improve bilateral hip ER to at least 40 degrees, bilateral hip IR to at least 40 degrees, and bilateral ankle dorsiflexion to 20 degrees  in order to improve overall mechanics and balance reaction skills   PT SHORT TERM GOAL #4   Title Patient will be able to ambulate over an uneven, unpredictable surface for at least 119ft with no balance loss, no ankle injury, no assistive device, and no falls reported            PT Long Term Goals - 01/02/15 1634    PT LONG TERM GOAL #1   Title Patient will be independent in correctly and consistently performing appropriate advanced HEP    Status On-going   PT LONG TERM GOAL #2   Title Patient will be able to perform single leg stance for at least 30 seconds with bilateral lower extremities, and will be able to maintain tandem stance for at least 60 seconds without loss of balance    Status On-going   PT LONG TERM GOAL #3   Title Patient will report that he is able to do his daily tasks and work around his property without fear of falling, also with no ankle injuries or falls reported    PT LONG TERM GOAL #4   Title Patient will demonstrate at least 4+/5 strength in bilateral lower extremities and proximal muscles in order  to enhance overall stability and reduce fall risk               Plan - 01/07/15 1628    Clinical Impression Statement Continued functional exercises and balance activities; patient had signficant difficulty with tandem gait and required Mod(A) at worst to maintain balance. Also had difficulty with retrogait, cues for improved step lengths. Patient was provided with education regarding  importance of moving smoothly and slowly during balance activities rather than trying to rush through. Increased parameters of Nustep for increased challenge to activity tolerance with good tolerance by patient today.    Rehab Potential Good   PT Frequency 2x / week   PT Duration 8 weeks   PT Treatment/Interventions ADLs/Self Care Home Management;Gait training;Neuromuscular re-education;Stair training;Functional mobility training;Patient/family education;Therapeutic activities;Therapeutic exercise;Manual techniques;Energy conservation;Balance training   PT Next Visit Plan Functional stretching and strengthening, balance tasks, gait training, ankle strength/range.   Continue working on gait quality next visit.  Increase Nustep hill level to 5.  Continue tandem, retro and sidestepping next session, progress to dynamic surface as able.     PT Home Exercise Plan ankle DF/PF/inversion/eversion, standing hip ABD, standing marches with 2 second hold    Consulted and Agree with Plan of Care Patient        Problem List Patient Active Problem List   Diagnosis Date Noted  . H/O prostate cancer 08/20/2014  . Radiation proctitis 08/19/2014  . H/O agent Orange exposure 07/01/2014  . Unstable angina 06/16/2014  . Shingles outbreak, right anterior thoracic 01/28/2014  . Abnormality of gait 12/18/2013  . Viral gastroenteritis 11/26/2013  . Dyslipidemia 02/12/2013  . Peripheral neuropathy 10/14/2012  . CAD (coronary artery disease) 10/14/2012  . HTN (hypertension)  10/14/2012  . Hx of adenomatous colonic polyps  07/26/2012  . Folic acid deficiency 30/02/6225  . Iron deficiency 05/02/2012  . Cancer of ascending colon 03/29/2012  . GERD 03/09/2010  . PREMATURE VENTRICULAR CONTRACTIONS 04/30/2009    Deniece Ree PT, DPT [phone]  [place] [address] [place], [place], [address] Phone: [phone]   Fax:  [phone]

## 2015-01-09 ENCOUNTER — Ambulatory Visit (HOSPITAL_COMMUNITY): Payer: Medicare Other

## 2015-01-09 DIAGNOSIS — R29898 Other symptoms and signs involving the musculoskeletal system: Secondary | ICD-10-CM

## 2015-01-09 DIAGNOSIS — Z9181 History of falling: Secondary | ICD-10-CM

## 2015-01-09 DIAGNOSIS — R293 Abnormal posture: Secondary | ICD-10-CM

## 2015-01-09 DIAGNOSIS — R2689 Other abnormalities of gait and mobility: Secondary | ICD-10-CM

## 2015-01-09 DIAGNOSIS — M6281 Muscle weakness (generalized): Secondary | ICD-10-CM

## 2015-01-09 NOTE — Patient Instructions (Signed)
FUNCTIONAL MOBILITY: Squat   Stance: shoulder-width on floor. Bend hips and knees. Keep back straight. Do not allow knees to bend past toes. Squeeze glutes and quads to stand. 10-20 reps per set, 1-2  sets per day, 3-5 days per week  Copyright  VHI. All rights reserved.   Forward Lunge   Standing with feet shoulder width apart and stomach tight, step forward with left leg. Repeat 10-20  times per set. Do 1-2  sets per session. Do 3-5 sessions per day.  http://orth.exer.us/1146   Copyright  VHI. All rights reserved.   Toe / Heel Raise (Standing)   Standing with support, raise heels, then rock back on heels and raise toes. Repeat 10-20  times.  Copyright  VHI. All rights reserved.   SINGLE LIMB STANCE   Stance: single leg on floor. Raise leg. Hold  30 seconds. Repeat with other leg. 3  reps per set, 1-2  sets per day, 3-5 days per week  Copyright  VHI. All rights reserved.

## 2015-01-09 NOTE — Therapy (Signed)
[place], [place], [address] Phone: [phone]   Fax:  [phone]  Physical Therapy Treatment  Patient Details  Name: David Pugh MRN: [id number] Date of Birth: 03/21/1935 Referring Provider:  [name]., MD  Encounter Date: 01/09/2015      PT End of Session - 01/09/15 1611    Visit Number 6   Number of Visits 16   Date for PT Re-Evaluation 02/10/15   Authorization Type [employer]    Authorization Time Period 12/11/14 to 02/10/15   Authorization - Visit Number 6   Authorization - Number of Visits 10   PT Start Time 6962   PT Stop Time 1652   PT Time Calculation (min) 46 min   Equipment Utilized During Treatment Gait belt   Activity Tolerance Patient tolerated treatment well   Behavior During Therapy [facility] for tasks assessed/performed      Past Medical History  Diagnosis Date  . Prostate cancer 2010    s/p radiation, surgery  . Radiation Feb 2011    treatment  . Biliary dyskinesia   . GERD (gastroesophageal reflux disease)   . Hiatal hernia 2010    tiny  . HTN (hypertension)   . Rosacea   . PTSD (post-traumatic stress disorder)   . Diverticula of colon 2010  . Colon polyps     tubular adenomas  . Arthritis   . Exposure to [employer]   . CAD (coronary artery disease) 2012    a. hx of stent x2 in ramus/OM and mid LAD in 2012  b. balloon angioplaty of OM  . Peripheral neuropathy   . Anemia     hx of anemia as a child   . Colon cancer 12/2011    s/p right hemicolectomy, did not tolerate chemo  . Iron deficiency 05/02/2012    followed by Dr. Tressie [name], iron infusions.   . Folic acid deficiency   . Heart palpitations 10/2012  . Gastroparesis 04/12/2013    borderline  . Myocardial infarction 2012  . Glaucoma   . Abnormality of gait 12/18/2013  . Radiation proctitis     Past Surgical History  Procedure Laterality Date  . Cholecystectomy    . Prostatectomy    . Tonsillectomy    .  Transurethral resection of prostate    . Esophagogastroduodenoscopy  10/10/2008    Dr. Tilda Burrow hiatal hernia, normal esophagus, normal stomach  . Colonoscopy  02/12/2004    Dr. Gala Romney- L side diverticula, inflammatory  colon polyps  . Esophagogastroduodenoscopy  02/12/2004    Dr. Dudley Major- normal   . Coronary stent placement  01/2011    2.5x85mm Xience drug-eluting stent in ramus intermedius/OMI vessel, 2.5x38mm Promus Element stent in mid LAD artery  . 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 Multiple colonic  polyps removed as described above. Sigmoid Diverticulosis/ 1.5 x 2 cm relatively flat ulcerated lesion in cecum/path showed adenocarcinoma of the colon. descending colon polyp with tubular adenoma and one fragment of focal high grade dysplasia.   . Knee surgery      left knee   . Partial colectomy  02/29/2012    Procedure: PARTIAL COLECTOMY;  Surgeon: Adin Hector, MD;  [facility]: [facility];  Service: General;  Laterality: Right;  . Vasectomy    . Colonoscopy  08/28/2012    XBM:[id number] proctitis. Colonic diverticulosis/Ulcerations at the surgical anastomosis  path: ulcerated colonic mucosa with prolapse changes, tubular  adenoma.   . Cardiac catheterization  02/13/2011    [facility], [facility] cardiology  . Cataract extraction      2011  . Colonoscopy N/A 08/02/2013    Procedure: COLONOSCOPY;  Surgeon: Daneil Dolin, MD;  [facility];  Service: Endoscopy;  Laterality: N/A;  1200  . Cataract extraction w/phaco Right 08/06/2013    Procedure: CATARACT EXTRACTION PHACO AND INTRAOCULAR LENS PLACEMENT (IOC);  Surgeon: Tonny Branch, MD;  Location: [facility];  Service: Ophthalmology;  Laterality: Right;  CDE:20.96  . Coronary angioplasty  06/17/14    OM1 POBA  . Left heart catheterization with coronary angiogram N/A 06/17/2014    Procedure: LEFT HEART CATHETERIZATION WITH CORONARY ANGIOGRAM;  Surgeon: Burnell Blanks, MD;  Location: [facility] CATH LAB;  Service: Cardiovascular;  Laterality: N/A;    There were no vitals filed for this visit.  Visit Diagnosis:  Poor balance  Weakness of both legs  Proximal muscle weakness  Poor posture  History of recent fall      Subjective Assessment - 01/09/15 1608    Subjective Pt stated he feels his balance is making improvements, stated most difficutly with tandem gait.  Reports ability to walk further distances for increased periods of time.  Reports riding his elliptical for 30 minutes earlier today   Currently in Pain? No/denies            [facility] PT Assessment - 01/09/15 0001    Assessment   Medical Diagnosis balance deficits    Onset Date 12/11/10   Next MD Visit [place] July 2016; [facility] [place]    AROM   Right Hip External Rotation  45  was 35   Right Hip Internal Rotation  42  was 35   Left Hip External Rotation  45  was 32   Left Hip Internal Rotation  38  was 34   Right Ankle Dorsiflexion 12  was 10   Left Ankle Dorsiflexion 14  was 12   Strength   Right Hip Flexion 3+/5  was 3/5   Right Hip Extension 3/5  was 3-/5   Right Hip ABduction 3/5  was 2+/5   Left Hip Flexion 3+/5  was 3/5   Left Hip Extension 3/5  was 3-/5   Left Hip ABduction 3+/5  was 3/56   Right Knee Flexion 4+/5  was 4+/5   Right Knee Extension 4+/5  was 4/5   Left Knee Flexion 4/5  was 4/5   Left Knee Extension 4+/5  was 4-/5   Right Ankle Dorsiflexion 3+/5  was 3+/5   Left Ankle Dorsiflexion 3+/5  was 3+//5   Berg Balance Test   Sit to Stand Able to stand without using hands and stabilize independently   Standing Unsupported Able to stand safely 2 minutes   Sitting with Back Unsupported but Feet Supported on Floor or Stool Able to sit safely and securely 2 minutes   Stand to Sit Sits safely with minimal use of hands   Transfers Able to transfer safely, minor use of hands   Standing Unsupported with Eyes Closed Able to stand 10 seconds  with supervision   Standing Ubsupported with Feet Together Able to place feet together independently and stand 1 minute safely   From Standing, Reach Forward with Outstretched Arm Can reach confidently >25 cm (10")   From Standing Position, Pick up Object from Floor Able to pick up shoe safely and easily   From Standing Position, Turn to Look Behind Over each Shoulder  Looks behind from both sides and weight shifts well   Turn 360 Degrees Able to turn 360 degrees safely in 4 seconds or less   Standing Unsupported, Alternately Place Feet on Step/Stool Able to stand independently and safely and complete 8 steps in 20 seconds   Standing Unsupported, One Foot in Front Needs help to step but can hold 15 seconds   Standing on One Leg Able to lift leg independently and hold equal to or more than 3 seconds   Total Score 50                     [facility] Adult PT Treatment/Exercise - 01/09/15 0001    Knee/Hip Exercises: Standing   Heel Raises 15 reps   Heel Raises Limitations toe raises 15x   Forward Lunges Both;10 reps   Forward Lunges Limitations onto floor   Side Lunges Both;10 reps   Side Lunges Limitations floor   Functional Squat 15 reps   Functional Squat Limitations 3D hip excurisions   SLS Lt 6" Rt 4: max of 5             Balance Exercises - 01/09/15 1900    Balance Exercises: Standing   Balance Beam tandem, retro and sidestepping 2RT (4 segments)             PT Short Term Goals - 01/09/15 1611    PT SHORT TERM GOAL #1   Title Patient will improve his score on the Berg balance test to at least 53 in order to reduce overall fall risk    Baseline 01/09/2015 50/56 was 44/56   Status On-going   PT SHORT TERM GOAL #2   Title Patient will muscle strength of all tested muscles in bilateral lower extremities to at least 4/5 in order to improve overall stability and reduce fall risk   Status On-going   PT SHORT TERM GOAL #3   Title Patient will improve bilateral hip  ER to at least 40 degrees, bilateral hip IR to at least 40 degrees, and bilateral ankle dorsiflexion to 20 degrees  in order to improve overall mechanics and balance reaction skills   Status On-going   PT SHORT TERM GOAL #4   Title Patient will be able to ambulate over an uneven, unpredictable surface for at least 17ft with no balance loss, no ankle injury, no assistive device, and no falls reported    Status On-going           PT Long Term Goals - 01/09/15 1634    PT LONG TERM GOAL #1   Title Patient will be independent in correctly and consistently performing appropriate advanced HEP    Status On-going   PT LONG TERM GOAL #2   Title Patient will be able to perform single leg stance for at least 30 seconds with bilateral lower extremities, and will be able to maintain tandem stance for at least 60 seconds without loss of balance    Status On-going   PT LONG TERM GOAL #3   Title Patient will report that he is able to do his daily tasks and work around his property without fear of falling, also with no ankle injuries or falls reported    Baseline Reports no fear of falling and ability to complete daily tasks without difficulty   Status Achieved   PT LONG TERM GOAL #4   Title Patient will demonstrate at least 4+/5 strength in bilateral lower extremities and proximal muscles in order to  enhance overall stability and reduce fall risk   Status On-going               Plan - 01/09/15 1850    Clinical Impression Statement Reassessment complete with the following findings:  Pt reports compliance with HEP including stretches and some balance activities at home.  Pt improved BERG balance test from 44/56 to 50/56.  Strength is progressing and AROM is improving as well especially Bil hip IR and ER.  Pt continues to have difficulty ambulating on dynamic surface and weak gluteal musculature.  Pt given advanced HEP to improve gluteal and gastroc strenghtening and balance.  Pt able to demonstrate  appropriate form with all new HEP exercises with no questions remaining.  Pt will continue to benefits from skilled intervention to improve ankle AROM, balance and strengthening.   PT Next Visit Plan Recommend continung OPPT for 4 more week to address goals unmet.  Continue functional stretching and strengthening, balance tasks, gait training, ankle strength/range.   Continue working on gait quality next visit.  Increase Nustep hill level to 5.  Continue tandem, retro and sidestepping next session, progress to dynamic surface as able.          Problem List Patient Active Problem List   Diagnosis Date Noted  . H/O prostate cancer 08/20/2014  . Radiation proctitis 08/19/2014  . H/O agent Orange exposure 07/01/2014  . Unstable angina 06/16/2014  . Shingles outbreak, right anterior thoracic 01/28/2014  . Abnormality of gait 12/18/2013  . Viral gastroenteritis 11/26/2013  . Dyslipidemia 02/12/2013  . Peripheral neuropathy 10/14/2012  . CAD (coronary artery disease) 10/14/2012  . HTN (hypertension) 10/14/2012  . Hx of adenomatous colonic polyps 07/26/2012  . Folic acid deficiency 76/28/3151  . Iron deficiency 05/02/2012  . Cancer of ascending colon 03/29/2012  . GERD 03/09/2010  . PREMATURE VENTRICULAR CONTRACTIONS 04/30/2009  Ihor [place], [place]; [place] #[id number] [phone]  Aldona Lento 01/09/2015, 7:01 PM  [place] [address] [place], [place], [address] Phone: [phone]   Fax:  [phone]

## 2015-01-14 ENCOUNTER — Ambulatory Visit (HOSPITAL_COMMUNITY): Payer: Medicare Other | Admitting: Physical Therapy

## 2015-01-14 DIAGNOSIS — R29898 Other symptoms and signs involving the musculoskeletal system: Secondary | ICD-10-CM

## 2015-01-14 DIAGNOSIS — R293 Abnormal posture: Secondary | ICD-10-CM

## 2015-01-14 DIAGNOSIS — R2689 Other abnormalities of gait and mobility: Secondary | ICD-10-CM

## 2015-01-14 DIAGNOSIS — M6281 Muscle weakness (generalized): Secondary | ICD-10-CM

## 2015-01-14 DIAGNOSIS — Z9181 History of falling: Secondary | ICD-10-CM

## 2015-01-14 NOTE — Therapy (Signed)
[place], [place], [address] Phone: [phone]   Fax:  [phone]  Physical Therapy Treatment  Patient Details  Name: David Pugh MRN: [id number] Date of Birth: Jan 16, 1935 Referring Provider:  [name]., MD  Encounter Date: 01/14/2015      PT End of Session - 01/14/15 1559    Visit Number 7   Number of Visits 16   Date for PT Re-Evaluation 02/10/15   Authorization Type [employer]    Authorization - Visit Number 7   Authorization - Number of Visits 10   PT Start Time 8270   PT Stop Time 1515   PT Time Calculation (min) 40 min   Equipment Utilized During Treatment Gait belt      Past Medical History  Diagnosis Date  . Prostate cancer 2010    s/p radiation, surgery  . Radiation Feb 2011    treatment  . Biliary dyskinesia   . GERD (gastroesophageal reflux disease)   . Hiatal hernia 2010    tiny  . HTN (hypertension)   . Rosacea   . PTSD (post-traumatic stress disorder)   . Diverticula of colon 2010  . Colon polyps     tubular adenomas  . Arthritis   . Exposure to [employer]   . CAD (coronary artery disease) 2012    a. hx of stent x2 in ramus/OM and mid LAD in 2012  b. balloon angioplaty of OM  . Peripheral neuropathy   . Anemia     hx of anemia as a child   . Colon cancer 12/2011    s/p right hemicolectomy, did not tolerate chemo  . Iron deficiency 05/02/2012    followed by Dr. Tressie [name], iron infusions.   . Folic acid deficiency   . Heart palpitations 10/2012  . Gastroparesis 04/12/2013    borderline  . Myocardial infarction 2012  . Glaucoma   . Abnormality of gait 12/18/2013  . Radiation proctitis     Past Surgical History  Procedure Laterality Date  . Cholecystectomy    . Prostatectomy    . Tonsillectomy    . Transurethral resection of prostate    . Esophagogastroduodenoscopy  10/10/2008    Dr. Tilda Burrow hiatal hernia, normal esophagus, normal stomach  . Colonoscopy   02/12/2004    Dr. Gala Romney- L side diverticula, inflammatory  colon polyps  . Esophagogastroduodenoscopy  02/12/2004    Dr. Dudley Major- normal   . Coronary stent placement  01/2011    2.5x68mm Xience drug-eluting stent in ramus intermedius/OMI vessel, 2.5x44mm Promus Element stent in mid LAD artery  . 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 Multiple colonic  polyps removed as described above. Sigmoid Diverticulosis/ 1.5 x 2 cm relatively flat ulcerated lesion in cecum/path showed adenocarcinoma of the colon. descending colon polyp with tubular adenoma and one fragment of focal high grade dysplasia.   . Knee surgery      left knee   . Partial colectomy  02/29/2012    Procedure: PARTIAL COLECTOMY;  Surgeon: Adin Hector, MD;  Location: [facility];  Service: General;  Laterality: Right;  . Vasectomy    . Colonoscopy  08/28/2012    BEM:[id number] proctitis. Colonic diverticulosis/Ulcerations at the surgical anastomosis  path: ulcerated colonic mucosa with prolapse changes, tubular adenoma.   . Cardiac catheterization  02/13/2011    [facility], [facility] cardiology  . Cataract extraction      2011  .  Colonoscopy N/A 08/02/2013    Procedure: COLONOSCOPY;  Surgeon: Daneil Dolin, MD;  Location: [facility];  Service: Endoscopy;  Laterality: N/A;  1200  . Cataract extraction w/phaco Right 08/06/2013    Procedure: CATARACT EXTRACTION PHACO AND INTRAOCULAR LENS PLACEMENT (IOC);  Surgeon: Tonny Branch, MD;  Location: [facility];  Service: Ophthalmology;  Laterality: Right;  CDE:20.96  . Coronary angioplasty  06/17/14    OM1 POBA  . Left heart catheterization with coronary angiogram N/A 06/17/2014    Procedure: LEFT HEART CATHETERIZATION WITH CORONARY ANGIOGRAM;  Surgeon: Burnell Blanks, MD;  Location: [facility] CATH LAB;  Service: Cardiovascular;  Laterality: N/A;    There were no vitals filed for this visit.  Visit Diagnosis:  Poor  balance  Proximal muscle weakness  Weakness of both legs  Poor posture  History of recent fall      Subjective Assessment - 01/14/15 1438    Subjective Pt states that he is doing his exercises at home.    Currently in Pain? No/denies              Balance Exercises - 01/14/15 1518    Balance Exercises: Standing   Standing Eyes Opened Narrow base of support (BOS);2 reps   Tandem Stance Eyes open;2 reps   SLS Eyes open;4 reps   Rockerboard Anterior/posterior;Lateral;Other time (comment)  2'   Balance Master: Limits for Stability test   quad I 32%; !! 17%A 82%; B 18%   Balance Master: Dynamic test  81 seconds    Tandem Gait Forward;Retro;2 reps   Sidestepping 2 reps   Marching Limitations x10           PT Education - 01/14/15 1558    Education provided Yes   Education Details for proper heel toe gt pattern.  Given HEp for this    Person(s) Educated Patient   Methods Explanation;Demonstration;Handout   Comprehension Verbalized understanding;Returned demonstration          PT Short Term Goals - 01/09/15 1611    PT SHORT TERM GOAL #1   Title Patient will improve his score on the Berg balance test to at least 53 in order to reduce overall fall risk    Baseline 01/09/2015 50/56 was 44/56   Status On-going   PT SHORT TERM GOAL #2   Title Patient will muscle strength of all tested muscles in bilateral lower extremities to at least 4/5 in order to improve overall stability and reduce fall risk   Status On-going   PT SHORT TERM GOAL #3   Title Patient will improve bilateral hip ER to at least 40 degrees, bilateral hip IR to at least 40 degrees, and bilateral ankle dorsiflexion to 20 degrees  in order to improve overall mechanics and balance reaction skills   Status On-going   PT SHORT TERM GOAL #4   Title Patient will be able to ambulate over an uneven, unpredictable surface for at least 137ft with no balance loss, no ankle injury, no assistive device, and no  falls reported    Status On-going           PT Long Term Goals - 01/09/15 1634    PT LONG TERM GOAL #1   Title Patient will be independent in correctly and consistently performing appropriate advanced HEP    Status On-going   PT LONG TERM GOAL #2   Title Patient will be able to perform single leg stance for at least 30 seconds with bilateral lower extremities, and will  be able to maintain tandem stance for at least 60 seconds without loss of balance    Status On-going   PT LONG TERM GOAL #3   Title Patient will report that he is able to do his daily tasks and work around his property without fear of falling, also with no ankle injuries or falls reported    Baseline Reports no fear of falling and ability to complete daily tasks without difficulty   Status Achieved   PT LONG TERM GOAL #4   Title Patient will demonstrate at least 4+/5 strength in bilateral lower extremities and proximal muscles in order to enhance overall stability and reduce fall risk   Status On-going               Plan - 01/14/15 1559    Clinical Impression Statement Today's treatment focused on proper heel toe gait as well as balance.  Added balance master to pt program.  All exercises were facilitated by therapist for proper balance strategies as well as safety.   PT Next Visit Plan begin hurdles and balance beam exercises         Problem List Patient Active Problem List   Diagnosis Date Noted  . H/O prostate cancer 08/20/2014  . Radiation proctitis 08/19/2014  . H/O agent Orange exposure 07/01/2014  . Unstable angina 06/16/2014  . Shingles outbreak, right anterior thoracic 01/28/2014  . Abnormality of gait 12/18/2013  . Viral gastroenteritis 11/26/2013  . Dyslipidemia 02/12/2013  . Peripheral neuropathy 10/14/2012  . CAD (coronary artery disease) 10/14/2012  . HTN (hypertension) 10/14/2012  . Hx of adenomatous colonic polyps 07/26/2012  . Folic acid deficiency 33/00/7622  . Iron deficiency  05/02/2012  . Cancer of ascending colon 03/29/2012  . GERD 03/09/2010  . PREMATURE VENTRICULAR CONTRACTIONS 04/30/2009   Rayetta Humphrey, PT CLT [phone] 01/14/2015, 4:02 PM  [place] [address] [place], [place], [address] Phone: [phone]   Fax:  [phone]

## 2015-01-16 ENCOUNTER — Ambulatory Visit (HOSPITAL_COMMUNITY): Payer: Medicare Other | Admitting: Physical Therapy

## 2015-01-16 DIAGNOSIS — R2689 Other abnormalities of gait and mobility: Secondary | ICD-10-CM

## 2015-01-16 DIAGNOSIS — M6281 Muscle weakness (generalized): Secondary | ICD-10-CM

## 2015-01-16 DIAGNOSIS — Z9181 History of falling: Secondary | ICD-10-CM

## 2015-01-16 DIAGNOSIS — R29898 Other symptoms and signs involving the musculoskeletal system: Secondary | ICD-10-CM

## 2015-01-16 DIAGNOSIS — R293 Abnormal posture: Secondary | ICD-10-CM

## 2015-01-16 NOTE — Therapy (Signed)
[place], [place], [address] Phone: [phone]   Fax:  [phone]  Physical Therapy Treatment  Patient Details  Name: David Pugh MRN: [id number] Date of Birth: [birth year] Referring Provider:  [name]., MD  Encounter Date: 01/16/2015      PT End of Session - 01/16/15 1612    Visit Number 8   Number of Visits 16   Date for PT Re-Evaluation 02/10/15   Authorization Type [employer]    Authorization Time Period 12/11/14 to 02/10/15   Authorization - Visit Number 8   Authorization - Number of Visits 10   Equipment Utilized During Treatment Gait belt   Activity Tolerance Patient tolerated treatment well   Behavior During Therapy [facility] for tasks assessed/performed      Past Medical History  Diagnosis Date  . Prostate cancer 2010    s/p radiation, surgery  . Radiation Feb 2011    treatment  . Biliary dyskinesia   . GERD (gastroesophageal reflux disease)   . Hiatal hernia 2010    tiny  . HTN (hypertension)   . Rosacea   . PTSD (post-traumatic stress disorder)   . Diverticula of colon 2010  . Colon polyps     tubular adenomas  . Arthritis   . Exposure to [employer]   . CAD (coronary artery disease) 2012    a. hx of stent x2 in ramus/OM and mid LAD in 2012  b. balloon angioplaty of OM  . Peripheral neuropathy   . Anemia     hx of anemia as a child   . Colon cancer 12/2011    s/p right hemicolectomy, did not tolerate chemo  . Iron deficiency 05/02/2012    followed by Dr. Tressie [name], iron infusions.   . Folic acid deficiency   . Heart palpitations 10/2012  . Gastroparesis 04/12/2013    borderline  . Myocardial infarction 2012  . Glaucoma   . Abnormality of gait 12/18/2013  . Radiation proctitis     Past Surgical History  Procedure Laterality Date  . Cholecystectomy    . Prostatectomy    . Tonsillectomy    . Transurethral resection of prostate    . Esophagogastroduodenoscopy  10/10/2008     Dr. Tilda Burrow hiatal hernia, normal esophagus, normal stomach  . Colonoscopy  02/12/2004    Dr. Gala Romney- L side diverticula, inflammatory  colon polyps  . Esophagogastroduodenoscopy  02/12/2004    Dr. Dudley Major- normal   . Coronary stent placement  01/2011    2.5x26mm Xience drug-eluting stent in ramus intermedius/OMI vessel, 2.5x70mm Promus Element stent in mid LAD artery  . 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 Multiple colonic  polyps removed as described above. Sigmoid Diverticulosis/ 1.5 x 2 cm relatively flat ulcerated lesion in cecum/path showed adenocarcinoma of the colon. descending colon polyp with tubular adenoma and one fragment of focal high grade dysplasia.   . Knee surgery      left knee   . Partial colectomy  02/29/2012    Procedure: PARTIAL COLECTOMY;  Surgeon: Adin Hector, MD;  Location: [facility];  Service: General;  Laterality: Right;  . Vasectomy    . Colonoscopy  08/28/2012    HDQ:[id number] proctitis. Colonic diverticulosis/Ulcerations at the surgical anastomosis  path: ulcerated colonic mucosa with prolapse changes, tubular adenoma.   . Cardiac catheterization  02/13/2011    [facility], [facility] cardiology  . Cataract extraction  2011  . Colonoscopy N/A 08/02/2013    Procedure: COLONOSCOPY;  Surgeon: Daneil Dolin, MD;  [facility];  Service: Endoscopy;  Laterality: N/A;  1200  . Cataract extraction w/phaco Right 08/06/2013    Procedure: CATARACT EXTRACTION PHACO AND INTRAOCULAR LENS PLACEMENT (IOC);  Surgeon: Tonny Branch, MD;  Location: [facility];  Service: Ophthalmology;  Laterality: Right;  CDE:20.96  . Coronary angioplasty  06/17/14    OM1 POBA  . Left heart catheterization with coronary angiogram N/A 06/17/2014    Procedure: LEFT HEART CATHETERIZATION WITH CORONARY ANGIOGRAM;  Surgeon: Burnell Blanks, MD;  Location: [facility] CATH LAB;  Service: Cardiovascular;  Laterality:  N/A;    There were no vitals filed for this visit.  Visit Diagnosis:  Poor balance  Proximal muscle weakness  Weakness of both legs  Poor posture  History of recent fall      Subjective Assessment - 01/16/15 1518    Subjective Patient states that he is a little sore today, did a LOT of exercise on elliptical this morning   Pertinent History Over the past 3-4 years, patient has been less and less sure on his feet; has been stumbling on level surfaces but has been able to catch himself each time. 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 Scheduled for agent orange [facility] next week.    Currently in Pain? No/denies                         [facility] Adult PT Treatment/Exercise - 01/16/15 0001    Knee/Hip Exercises: Stretches   Active Hamstring Stretch 3 reps;30 seconds   Active Hamstring Stretch Limitations 12 inch box    Piriformis Stretch 3 reps;30 seconds   Piriformis Stretch Limitations seated   Gastroc Stretch 3 reps;30 seconds   Gastroc Stretch Limitations slant board   Knee/Hip Exercises: Standing   Heel Raises 1 set;20 reps   Heel Raises Limitations floor    Forward Lunges Both;1 set;10 reps   Forward Lunges Limitations onto floor, U HHA    Lateral Step Up Both;1 set;15 reps   Lateral Step Up Limitations 6 inch step, U HHA    Functional Squat 15 reps   Functional Squat Limitations cues for form    Other Standing Knee Exercises 3D hip excursions split stance 1x10   Knee/Hip Exercises: Supine   Bridges Both;1 set;15 reps   Bridges Limitations standard form    Straight Leg Raises Both;1 set;10 reps   Straight Leg Raises Limitations standard form    Knee/Hip Exercises: Sidelying   Hip ABduction Both;1 set;10 reps   Hip ABduction Limitations cues for form    Knee/Hip Exercises: Prone   Hip Extension Both;1 set;10 reps   Hip Extension Limitations manual facilitation for form              Balance  Exercises - 01/16/15 1612    Balance Exercises: Standing   SLS Eyes open;4 reps;15 secs   Tandem Gait Forward;4 reps;Other (comment)  4x66ft    Retro Gait 4 reps;Other (comment)  4x34ft   Sidestepping 4 reps;Other (comment)  4x72ft            PT Education - 01/16/15 1606    Education provided Yes   Education Details impact of weak hip musculature on gait deviations    Person(s) Educated Patient   Methods Explanation   Comprehension Verbalized understanding  PT Short Term Goals - 01/09/15 1611    PT SHORT TERM GOAL #1   Title Patient will improve his score on the Berg balance test to at least 53 in order to reduce overall fall risk    Baseline 01/09/2015 50/56 was 44/56   Status On-going   PT SHORT TERM GOAL #2   Title Patient will muscle strength of all tested muscles in bilateral lower extremities to at least 4/5 in order to improve overall stability and reduce fall risk   Status On-going   PT SHORT TERM GOAL #3   Title Patient will improve bilateral hip ER to at least 40 degrees, bilateral hip IR to at least 40 degrees, and bilateral ankle dorsiflexion to 20 degrees  in order to improve overall mechanics and balance reaction skills   Status On-going   PT SHORT TERM GOAL #4   Title Patient will be able to ambulate over an uneven, unpredictable surface for at least 135ft with no balance loss, no ankle injury, no assistive device, and no falls reported    Status On-going           PT Long Term Goals - 01/09/15 1634    PT LONG TERM GOAL #1   Title Patient will be independent in correctly and consistently performing appropriate advanced HEP    Status On-going   PT LONG TERM GOAL #2   Title Patient will be able to perform single leg stance for at least 30 seconds with bilateral lower extremities, and will be able to maintain tandem stance for at least 60 seconds without loss of balance    Status On-going   PT LONG TERM GOAL #3   Title Patient will report that  he is able to do his daily tasks and work around his property without fear of falling, also with no ankle injuries or falls reported    Baseline Reports no fear of falling and ability to complete daily tasks without difficulty   Status Achieved   PT LONG TERM GOAL #4   Title Patient will demonstrate at least 4+/5 strength in bilateral lower extremities and proximal muscles in order to enhance overall stability and reduce fall risk   Status On-going               Plan - 01/16/15 1613    Clinical Impression Statement Patient presented fatigued today, stated that he spend 30 minutes on elliptical this morning with the level cranked up much higher than he is used to. Adjusted exericses and balance activities during today's session- possibly due to fatigue, patient had increased difficulty with balance exercises and did require Min-Mod(A) to maintain balance throughout balance tasks today. Also required U HHA during exercises due to poor balance and instability. Noted trendelenburg gait pattern and initiated proximal muscle strengthening today.   Rehab Potential Good   PT Frequency 2x / week   PT Duration 8 weeks   PT Treatment/Interventions ADLs/Self Care Home Management;Gait training;Neuromuscular re-education;Stair training;Functional mobility training;Patient/family education;Therapeutic activities;Therapeutic exercise;Manual techniques;Energy conservation;Balance training   PT Next Visit Plan functional strength and stretching; balance tasks; functional activity tolerance    PT Home Exercise Plan ankle DF/PF/inversion/eversion, standing hip ABD, standing marches with 2 second hold    Consulted and Agree with Plan of Care Patient        Problem List Patient Active Problem List   Diagnosis Date Noted  . H/O prostate cancer 08/20/2014  . Radiation proctitis 08/19/2014  . H/O agent Orange exposure 07/01/2014  .  Unstable angina 06/16/2014  . Shingles outbreak, right anterior thoracic  01/28/2014  . Abnormality of gait 12/18/2013  . Viral gastroenteritis 11/26/2013  . Dyslipidemia 02/12/2013  . Peripheral neuropathy 10/14/2012  . CAD (coronary artery disease) 10/14/2012  . HTN (hypertension) 10/14/2012  . Hx of adenomatous colonic polyps 07/26/2012  . Folic acid deficiency 46/11/7996  . Iron deficiency 05/02/2012  . Cancer of ascending colon 03/29/2012  . GERD 03/09/2010  . PREMATURE VENTRICULAR CONTRACTIONS 04/30/2009    Deniece Ree PT, DPT [phone]  [facility] [address] [place], [place], [address] Phone: [phone]   Fax:  [phone]

## 2015-01-21 ENCOUNTER — Ambulatory Visit (HOSPITAL_COMMUNITY): Payer: Medicare Other | Admitting: Physical Therapy

## 2015-01-21 DIAGNOSIS — Z9181 History of falling: Secondary | ICD-10-CM

## 2015-01-21 DIAGNOSIS — M6281 Muscle weakness (generalized): Secondary | ICD-10-CM

## 2015-01-21 DIAGNOSIS — R2689 Other abnormalities of gait and mobility: Secondary | ICD-10-CM | POA: Diagnosis not present

## 2015-01-21 DIAGNOSIS — R293 Abnormal posture: Secondary | ICD-10-CM

## 2015-01-21 DIAGNOSIS — R29898 Other symptoms and signs involving the musculoskeletal system: Secondary | ICD-10-CM

## 2015-01-21 NOTE — Therapy (Signed)
[facility] [address] [place], [place], [address] Phone: [phone]   Fax:  [phone]  Physical Therapy Treatment  Patient Details  Name: David Pugh MRN: [id number] Date of Birth: [birth year] Referring Provider:  [name]., MD  Encounter Date: 01/21/2015      PT End of Session - 01/21/15 1619    Visit Number 9   Number of Visits 16   Date for PT Re-Evaluation 02/10/15   PT Start Time 1528   PT Stop Time 1610   PT Time Calculation (min) 42 min   Equipment Utilized During Treatment Gait belt   Activity Tolerance Patient tolerated treatment well      Past Medical History  Diagnosis Date  . Prostate cancer 2010    s/p radiation, surgery  . Radiation Feb 2011    treatment  . Biliary dyskinesia   . GERD (gastroesophageal reflux disease)   . Hiatal hernia 2010    tiny  . HTN (hypertension)   . Rosacea   . PTSD (post-traumatic stress disorder)   . Diverticula of colon 2010  . Colon polyps     tubular adenomas  . Arthritis   . Exposure to [employer]   . CAD (coronary artery disease) 2012    a. hx of stent x2 in ramus/OM and mid LAD in 2012  b. balloon angioplaty of OM  . Peripheral neuropathy   . Anemia     hx of anemia as a child   . Colon cancer 12/2011    s/p right hemicolectomy, did not tolerate chemo  . Iron deficiency 05/02/2012    followed by Dr. Tressie [name], iron infusions.   . Folic acid deficiency   . Heart palpitations 10/2012  . Gastroparesis 04/12/2013    borderline  . Myocardial infarction 2012  . Glaucoma   . Abnormality of gait 12/18/2013  . Radiation proctitis     Past Surgical History  Procedure Laterality Date  . Cholecystectomy    . Prostatectomy    . Tonsillectomy    . Transurethral resection of prostate    . Esophagogastroduodenoscopy  10/10/2008    Dr. Tilda Burrow hiatal hernia, normal esophagus, normal stomach  . Colonoscopy  02/12/2004    Dr. Gala Romney- L side diverticula,  inflammatory  colon polyps  . Esophagogastroduodenoscopy  02/12/2004    Dr. Dudley Major- normal   . Coronary stent placement  01/2011    2.5x80mm Xience drug-eluting stent in ramus intermedius/OMI vessel, 2.5x47mm Promus Element stent in mid LAD artery  . 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 Multiple colonic  polyps removed as described above. Sigmoid Diverticulosis/ 1.5 x 2 cm relatively flat ulcerated lesion in cecum/path showed adenocarcinoma of the colon. descending colon polyp with tubular adenoma and one fragment of focal high grade dysplasia.   . Knee surgery      left knee   . Partial colectomy  02/29/2012    Procedure: PARTIAL COLECTOMY;  Surgeon: Adin Hector, MD;  Location: [facility];  Service: General;  Laterality: Right;  . Vasectomy    . Colonoscopy  08/28/2012    BUL:AGTXMIWOE proctitis. Colonic diverticulosis/Ulcerations at the surgical anastomosis  path: ulcerated colonic mucosa with prolapse changes, tubular adenoma.   . Cardiac catheterization  02/13/2011    [facility] cardiology  . Cataract extraction      2011  . Colonoscopy N/A 08/02/2013    Procedure: COLONOSCOPY;  Surgeon: Cristopher Estimable  Rourk, MD;  Location: [facility];  Service: Endoscopy;  Laterality: N/A;  1200  . Cataract extraction w/phaco Right 08/06/2013    Procedure: CATARACT EXTRACTION PHACO AND INTRAOCULAR LENS PLACEMENT (IOC);  Surgeon: Tonny Branch, MD;  Location: [facility];  Service: Ophthalmology;  Laterality: Right;  CDE:20.96  . Coronary angioplasty  06/17/14    OM1 POBA  . Left heart catheterization with coronary angiogram N/A 06/17/2014    Procedure: LEFT HEART CATHETERIZATION WITH CORONARY ANGIOGRAM;  Surgeon: Burnell Blanks, MD;  Location: [facility] CATH LAB;  Service: Cardiovascular;  Laterality: N/A;    There were no vitals filed for this visit.  Visit Diagnosis:  Poor balance  Proximal muscle weakness  Poor  posture  History of recent fall  Weakness of both legs          [facility] Adult PT Treatment/Exercise - 01/21/15 0001    Knee/Hip Exercises: Stretches   Active Hamstring Stretch 3 reps;30 seconds   Active Hamstring Stretch Limitations 12 inch box    Quad Stretch 2 reps;30 seconds   Piriformis Stretch 3 reps;30 seconds   Piriformis Stretch Limitations seated   Gastroc Stretch 3 reps;30 seconds   Gastroc Stretch Limitations slant board   Knee/Hip Exercises: Standing   Other Standing Knee Exercises 3D hip excursions split stance 1x10             Balance Exercises - 01/21/15 1528    Balance Exercises: Standing   Tandem Stance Eyes closed   SLS with Vectors Solid surface;2 reps;10 secs   Rockerboard Anterior/posterior;Lateral;Other time (comment)  2 ' EAch    Balance Master: Limits for Stability 2:00   Balance Master: Dynamic 4"01   Tandem Gait Forward;Foam/compliant surface;2 reps   Retro Gait Foam/compliant surface;2 reps           PT Education - 01/21/15 1617    Education provided Yes   Education Details heel toe gt    Methods Explanation   Comprehension Verbalized understanding;Returned demonstration          PT Short Term Goals - 01/09/15 1611    PT SHORT TERM GOAL #1   Title Patient will improve his score on the Berg balance test to at least 53 in order to reduce overall fall risk    Baseline 01/09/2015 50/56 was 44/56   Status On-going   PT SHORT TERM GOAL #2   Title Patient will muscle strength of all tested muscles in bilateral lower extremities to at least 4/5 in order to improve overall stability and reduce fall risk   Status On-going   PT SHORT TERM GOAL #3   Title Patient will improve bilateral hip ER to at least 40 degrees, bilateral hip IR to at least 40 degrees, and bilateral ankle dorsiflexion to 20 degrees  in order to improve overall mechanics and balance reaction skills   Status On-going   PT SHORT TERM GOAL #4   Title Patient will be  able to ambulate over an uneven, unpredictable surface for at least 137ft with no balance loss, no ankle injury, no assistive device, and no falls reported    Status On-going           PT Long Term Goals - 01/09/15 1634    PT LONG TERM GOAL #1   Title Patient will be independent in correctly and consistently performing appropriate advanced HEP    Status On-going   PT LONG TERM GOAL #2   Title Patient will be able to perform single leg  stance for at least 30 seconds with bilateral lower extremities, and will be able to maintain tandem stance for at least 60 seconds without loss of balance    Status On-going   PT LONG TERM GOAL #3   Title Patient will report that he is able to do his daily tasks and work around his property without fear of falling, also with no ankle injuries or falls reported    Baseline Reports no fear of falling and ability to complete daily tasks without difficulty   Status Achieved   PT LONG TERM GOAL #4   Title Patient will demonstrate at least 4+/5 strength in bilateral lower extremities and proximal muscles in order to enhance overall stability and reduce fall risk   Status On-going               Plan - 01/21/15 1620    Clinical Impression Statement Pt needed instruction for form when stretching, pt tends to go beyond a healthy stretch; Pt showed noted improvement with foam activities while balance master is challenging.  All exercises were completed with therapist facilitation to ensure pt saftey.    PT Next Visit Plan spend extra time on heel toe gait activity next treatment.        Problem List Patient Active Problem List   Diagnosis Date Noted  . H/O prostate cancer 08/20/2014  . Radiation proctitis 08/19/2014  . H/O agent Orange exposure 07/01/2014  . Unstable angina 06/16/2014  . Shingles outbreak, right anterior thoracic 01/28/2014  . Abnormality of gait 12/18/2013  . Viral gastroenteritis 11/26/2013  . Dyslipidemia 02/12/2013  .  Peripheral neuropathy 10/14/2012  . CAD (coronary artery disease) 10/14/2012  . HTN (hypertension) 10/14/2012  . Hx of adenomatous colonic polyps 07/26/2012  . Folic acid deficiency 81/27/5170  . Iron deficiency 05/02/2012  . Cancer of ascending colon 03/29/2012  . GERD 03/09/2010  . PREMATURE VENTRICULAR CONTRACTIONS 04/30/2009   Rayetta Humphrey, PT CLT [phone] 01/21/2015, 4:23 PM  [facility] [address] [place], [place], [address] Phone: [phone]   Fax:  [phone]

## 2015-01-23 ENCOUNTER — Ambulatory Visit (HOSPITAL_COMMUNITY): Payer: Medicare Other

## 2015-01-23 DIAGNOSIS — Z9181 History of falling: Secondary | ICD-10-CM

## 2015-01-23 DIAGNOSIS — R29898 Other symptoms and signs involving the musculoskeletal system: Secondary | ICD-10-CM

## 2015-01-23 DIAGNOSIS — R2689 Other abnormalities of gait and mobility: Secondary | ICD-10-CM | POA: Diagnosis not present

## 2015-01-23 DIAGNOSIS — M6281 Muscle weakness (generalized): Secondary | ICD-10-CM

## 2015-01-23 DIAGNOSIS — R293 Abnormal posture: Secondary | ICD-10-CM

## 2015-01-23 NOTE — Therapy (Addendum)
[facility] [place], [place], [address] Phone: [phone]   Fax:  [phone]  Physical Therapy Treatment  Patient Details  Name: David Pugh MRN: [id number] Date of Birth: 03-05-1935 Referring Provider:  [name]., MD  Encounter Date: 01/23/2015      PT End of Session - 01/23/15 1610    Visit Number 10   Number of Visits 16   Date for PT Re-Evaluation 02/10/15   Authorization Type [employer]    Authorization Time Period 12/11/14 to 02/10/15   Authorization - Visit Number 10   Authorization - Number of Visits 16   PT Start Time 1602   PT Stop Time 4128   PT Time Calculation (min) 45 min   Activity Tolerance Patient tolerated treatment well   Behavior During Therapy [facility] for tasks assessed/performed      Past Medical History  Diagnosis Date  . Prostate cancer 2010    s/p radiation, surgery  . Radiation Feb 2011    treatment  . Biliary dyskinesia   . GERD (gastroesophageal reflux disease)   . Hiatal hernia 2010    tiny  . HTN (hypertension)   . Rosacea   . PTSD (post-traumatic stress disorder)   . Diverticula of colon 2010  . Colon polyps     tubular adenomas  . Arthritis   . Exposure to [employer]   . CAD (coronary artery disease) 2012    a. hx of stent x2 in ramus/OM and mid LAD in 2012  b. balloon angioplaty of OM  . Peripheral neuropathy   . Anemia     hx of anemia as a child   . Colon cancer 12/2011    s/p right hemicolectomy, did not tolerate chemo  . Iron deficiency 05/02/2012    followed by Dr. Tressie [name], iron infusions.   . Folic acid deficiency   . Heart palpitations 10/2012  . Gastroparesis 04/12/2013    borderline  . Myocardial infarction 2012  . Glaucoma   . Abnormality of gait 12/18/2013  . Radiation proctitis     Past Surgical History  Procedure Laterality Date  . Cholecystectomy    . Prostatectomy    . Tonsillectomy    . Transurethral resection of prostate    .  Esophagogastroduodenoscopy  10/10/2008    Dr. Tilda Burrow hiatal hernia, normal esophagus, normal stomach  . Colonoscopy  02/12/2004    Dr. Gala Romney- L side diverticula, inflammatory  colon polyps  . Esophagogastroduodenoscopy  02/12/2004    Dr. Dudley Major- normal   . Coronary stent placement  01/2011    2.5x52mm Xience drug-eluting stent in ramus intermedius/OMI vessel, 2.5x4mm Promus Element stent in mid LAD artery  . 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 Multiple colonic  polyps removed as described above. Sigmoid Diverticulosis/ 1.5 x 2 cm relatively flat ulcerated lesion in cecum/path showed adenocarcinoma of the colon. descending colon polyp with tubular adenoma and one fragment of focal high grade dysplasia.   . Knee surgery      left knee   . Partial colectomy  02/29/2012    Procedure: PARTIAL COLECTOMY;  Surgeon: Adin Hector, MD;  Location: [facility];  Service: General;  Laterality: Right;  . Vasectomy    . Colonoscopy  08/28/2012    NOM:VEHMCNOBS proctitis. Colonic diverticulosis/Ulcerations at the surgical anastomosis  path: ulcerated colonic mucosa with prolapse changes, tubular adenoma.   . Cardiac catheterization  02/13/2011  [facility], [facility] cardiology  . Cataract extraction      2011  . Colonoscopy N/A 08/02/2013    Procedure: COLONOSCOPY;  Surgeon: Daneil Dolin, MD;  [facility]: [facility];  Service: Endoscopy;  Laterality: N/A;  1200  . Cataract extraction w/phaco Right 08/06/2013    Procedure: CATARACT EXTRACTION PHACO AND INTRAOCULAR LENS PLACEMENT (IOC);  Surgeon: Tonny Branch, MD;  Location: [facility];  Service: Ophthalmology;  Laterality: Right;  CDE:20.96  . Coronary angioplasty  06/17/14    OM1 POBA  . Left heart catheterization with coronary angiogram N/A 06/17/2014    Procedure: LEFT HEART CATHETERIZATION WITH CORONARY ANGIOGRAM;  Surgeon: Burnell Blanks, MD;  Location: [facility] CATH LAB;   Service: Cardiovascular;  Laterality: N/A;    There were no vitals filed for this visit.  Visit Diagnosis:  Poor balance  Proximal muscle weakness  Poor posture  History of recent fall  Weakness of both legs      Subjective Assessment - 01/23/15 1604    Subjective Pt stated he has no pain today, feels really tight in ankle.  Feels his balance is still limited, strength improving.  Reports no shuffling or falls lately.   Currently in Pain? No/denies                     Balance Exercises - 01/23/15 1846    Balance Exercises: Standing   Rockerboard Anterior/posterior;Lateral;Other time (comment)        01/23/15 0001  Knee/Hip Exercises: Stretches  Gastroc Stretch 3 reps;30 seconds  Gastroc Stretch Limitations slant board  Hip Flexor Stretch 3 reps;30 seconds  Hip Flexor Stretch Limitations 14 in step  Knee/Hip Exercises: Aerobic  Stationary Bike Nustep seat 10, hills #3, level 5 for 10 minutes   Knee/Hip Exercises: Standing  Heel Raises 1 set;20 reps  Heel Raises Limitations heel and toe raises on airex  Forward Lunges Both;1 set;10 reps  Forward Lunges Limitations onto floor, U HHA   Side Lunges Both;10 reps  Side Lunges Limitations floor  Functional Squat 15 reps  Functional Squat Limitations 3D hip excursion  Rocker Board 2 minutes  Rocker Board Limitations A/P and R/L no HHA  SLS Lt 5", Rt 9" max of 5  Gait Training Gait training 678 feet SBA with min cueing for arm swing with opposite LE for more normalized gait mechanics.  Other Standing Knee Exercises 3D hip excursions split stance 1x10           PT Short Term Goals - 01/23/15 1600    PT SHORT TERM GOAL #1   Title Patient will improve his score on the Berg balance test to at least 53 in order to reduce overall fall risk    PT SHORT TERM GOAL #2   Title Patient will muscle strength of all tested muscles in bilateral lower extremities to at least 4/5 in order to improve overall stability  and reduce fall risk   PT SHORT TERM GOAL #3   Title Patient will improve bilateral hip ER to at least 40 degrees, bilateral hip IR to at least 40 degrees, and bilateral ankle dorsiflexion to 20 degrees  in order to improve overall mechanics and balance reaction skills   PT SHORT TERM GOAL #4   Title Patient will be able to ambulate over an uneven, unpredictable surface for at least 166ft with no balance loss, no ankle injury, no assistive device, and no falls reported  PT Long Term Goals - 01/23/15 1600    PT LONG TERM GOAL #1   Title Patient will be independent in correctly and consistently performing appropriate advanced HEP    PT LONG TERM GOAL #2   Title Patient will be able to perform single leg stance for at least 30 seconds with bilateral lower extremities, and will be able to maintain tandem stance for at least 60 seconds without loss of balance    PT LONG TERM GOAL #3   Title Patient will report that he is able to do his daily tasks and work around his property without fear of falling, also with no ankle injuries or falls reported    PT LONG TERM GOAL #4   Title Patient will demonstrate at least 4+/5 strength in bilateral lower extremities and proximal muscles in order to enhance overall stability and reduce fall risk               Plan - 01/23/15 1847    Clinical Impression Statement Session focus on improving gait mechanics and balance training.  Cueing required to improve arm swing with opposite LE heel strike.  Balance activities complete on dynamic surface with less assistance required than last session.  Therapist facilitaiton required for proper form wtih all exercises.  No reports of pain through session, pt limited by fatiguie.     PT Next Visit Plan Continues current PT POC to improve gait mechanics, balance activities on dynamic surfaces and functional strengthening.         Problem List Patient Active Problem List   Diagnosis Date Noted  . H/O  prostate cancer 08/20/2014  . Radiation proctitis 08/19/2014  . H/O agent Orange exposure 07/01/2014  . Unstable angina 06/16/2014  . Shingles outbreak, right anterior thoracic 01/28/2014  . Abnormality of gait 12/18/2013  . Viral gastroenteritis 11/26/2013  . Dyslipidemia 02/12/2013  . Peripheral neuropathy 10/14/2012  . CAD (coronary artery disease) 10/14/2012  . HTN (hypertension) 10/14/2012  . Hx of adenomatous colonic polyps 07/26/2012  . Folic acid deficiency 62/69/4854  . Iron deficiency 05/02/2012  . Cancer of ascending colon 03/29/2012  . GERD 03/09/2010  . PREMATURE VENTRICULAR CONTRACTIONS 04/30/2009   Aldona Lento, PTA      G-Codes - 2015-01-31 12-04-1216    Functional Assessment Tool Used FOTO    Functional Limitation Mobility: Walking and moving around   Mobility: Walking and Moving Around Current Status [phone]) At least 20 percent but less than 40 percent impaired, limited or restricted   Mobility: Walking and Moving Around Goal Status [phone]) At least 1 percent but less than 20 percent impaired, limited or restricted      Deniece Ree PT, DPT [place], [place], [address] Phone: [phone]   Fax:  [phone]

## 2015-01-29 ENCOUNTER — Ambulatory Visit (HOSPITAL_COMMUNITY): Payer: Medicare Other | Attending: Family Medicine

## 2015-01-29 ENCOUNTER — Ambulatory Visit (HOSPITAL_COMMUNITY): Payer: TRICARE For Life (TFL) | Admitting: Physical Therapy

## 2015-01-29 DIAGNOSIS — R2689 Other abnormalities of gait and mobility: Secondary | ICD-10-CM | POA: Insufficient documentation

## 2015-01-29 DIAGNOSIS — R293 Abnormal posture: Secondary | ICD-10-CM | POA: Diagnosis not present

## 2015-01-29 DIAGNOSIS — Z9181 History of falling: Secondary | ICD-10-CM | POA: Diagnosis not present

## 2015-01-29 DIAGNOSIS — M6281 Muscle weakness (generalized): Secondary | ICD-10-CM

## 2015-01-29 DIAGNOSIS — R29898 Other symptoms and signs involving the musculoskeletal system: Secondary | ICD-10-CM | POA: Insufficient documentation

## 2015-01-29 NOTE — Therapy (Signed)
[place], [place], [address] Phone: [phone]   Fax:  [phone]  Physical Therapy Treatment  Patient Details  Name: David Pugh MRN: [id number] Date of Birth: 19-Jul-1935 Referring Provider:  [name]., MD  Encounter Date: 01/29/2015      PT End of Session - 01/29/15 0809    Visit Number 11   Number of Visits 16   Date for PT Re-Evaluation 02/10/15   Authorization Type [employer]    Authorization Time Period 12/11/14 to 02/10/15   Authorization - Visit Number 11   Authorization - Number of Visits 16   PT Start Time 0801   PT Stop Time 2878   PT Time Calculation (min) 54 min   Equipment Utilized During Treatment Gait belt   Activity Tolerance Patient tolerated treatment well   Behavior During Therapy [facility] for tasks assessed/performed      Past Medical History  Diagnosis Date  . Prostate cancer 2010    s/p radiation, surgery  . Radiation Feb 2011    treatment  . Biliary dyskinesia   . GERD (gastroesophageal reflux disease)   . Hiatal hernia 2010    tiny  . HTN (hypertension)   . Rosacea   . PTSD (post-traumatic stress disorder)   . Diverticula of colon 2010  . Colon polyps     tubular adenomas  . Arthritis   . Exposure to [employer]   . CAD (coronary artery disease) 2012    a. hx of stent x2 in ramus/OM and mid LAD in 2012  b. balloon angioplaty of OM  . Peripheral neuropathy   . Anemia     hx of anemia as a child   . Colon cancer 12/2011    s/p right hemicolectomy, did not tolerate chemo  . Iron deficiency 05/02/2012    followed by Dr. Tressie [name], iron infusions.   . Folic acid deficiency   . Heart palpitations 10/2012  . Gastroparesis 04/12/2013    borderline  . Myocardial infarction 2012  . Glaucoma   . Abnormality of gait 12/18/2013  . Radiation proctitis     Past Surgical History  Procedure Laterality Date  . Cholecystectomy    . Prostatectomy    . Tonsillectomy    .  Transurethral resection of prostate    . Esophagogastroduodenoscopy  10/10/2008    Dr. Tilda Burrow hiatal hernia, normal esophagus, normal stomach  . Colonoscopy  02/12/2004    Dr. Gala Romney- L side diverticula, inflammatory  colon polyps  . Esophagogastroduodenoscopy  02/12/2004    Dr. Dudley Major- normal   . Coronary stent placement  01/2011    2.5x38mm Xience drug-eluting stent in ramus intermedius/OMI vessel, 2.5x76mm Promus Element stent in mid LAD artery  . 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 Multiple colonic  polyps removed as described above. Sigmoid Diverticulosis/ 1.5 x 2 cm relatively flat ulcerated lesion in cecum/path showed adenocarcinoma of the colon. descending colon polyp with tubular adenoma and one fragment of focal high grade dysplasia.   . Knee surgery      left knee   . Partial colectomy  02/29/2012    Procedure: PARTIAL COLECTOMY;  Surgeon: Adin Hector, MD;  Location: [facility];  Service: General;  Laterality: Right;  . Vasectomy    . Colonoscopy  08/28/2012    MVE:[id number] proctitis. Colonic diverticulosis/Ulcerations at the surgical anastomosis  path: ulcerated colonic mucosa with prolapse changes, tubular  adenoma.   . Cardiac catheterization  02/13/2011    [facility], [facility] cardiology  . Cataract extraction      2011  . Colonoscopy N/A 08/02/2013    Procedure: COLONOSCOPY;  Surgeon: Daneil Dolin, MD;  [facility]: [facility];  Service: Endoscopy;  Laterality: N/A;  1200  . Cataract extraction w/phaco Right 08/06/2013    Procedure: CATARACT EXTRACTION PHACO AND INTRAOCULAR LENS PLACEMENT (IOC);  Surgeon: Tonny Branch, MD;  Location: [facility] ORS;  Service: Ophthalmology;  Laterality: Right;  CDE:20.96  . Coronary angioplasty  06/17/14    OM1 POBA  . Left heart catheterization with coronary angiogram N/A 06/17/2014    Procedure: LEFT HEART CATHETERIZATION WITH CORONARY ANGIOGRAM;  Surgeon: Burnell Blanks, MD;  Location: [facility] CATH LAB;  Service: Cardiovascular;  Laterality: N/A;    There were no vitals filed for this visit.  Visit Diagnosis:  Poor balance  Proximal muscle weakness  Poor posture  History of recent fall  Weakness of both legs      Subjective Assessment - 01/29/15 0804    Subjective Pt feels his balance is improving.  Reported he has been walking more, walked a mile over weekend.  Pt reported stairs are difficult   Currently in Pain? No/denies            [facility] PT Assessment - 01/29/15 0001    Assessment   Medical Diagnosis balance deficits    Onset Date/Surgical Date 12/11/10   Next MD Visit [place] 6/1/2016Ivin Booty [place]    AROM   Right Hip External Rotation  45   Right Hip Internal Rotation  45   Left Hip External Rotation  45   Left Hip Internal Rotation  46   Right Ankle Dorsiflexion 15   Left Ankle Dorsiflexion 14   Strength   Right Hip Flexion 4-/5   Right Hip Extension 4-/5   Right Hip ABduction 4+/5   Left Hip Extension 3+/5   Left Hip ABduction 4/5   Right Knee Flexion 4+/5   Left Knee Flexion 4+/5   Berg Balance Test   Sit to Stand Able to stand without using hands and stabilize independently   Standing Unsupported Able to stand safely 2 minutes   Sitting with Back Unsupported but Feet Supported on Floor or Stool Able to sit safely and securely 2 minutes   Stand to Sit Sits safely with minimal use of hands   Transfers Able to transfer safely, minor use of hands   Standing Unsupported with Eyes Closed Able to stand 10 seconds with supervision   Standing Ubsupported with Feet Together Able to place feet together independently and stand 1 minute safely   From Standing, Reach Forward with Outstretched Arm Can reach confidently >25 cm (10")   From Standing Position, Pick up Object from Floor Able to pick up shoe safely and easily   From Standing Position, Turn to Look Behind Over each Shoulder Looks behind from both sides and  weight shifts well   Turn 360 Degrees Able to turn 360 degrees safely in 4 seconds or less   Standing Unsupported, Alternately Place Feet on Step/Stool Able to stand independently and safely and complete 8 steps in 20 seconds   Standing Unsupported, One Foot in Front Able to take small step independently and hold 30 seconds   Standing on One Leg Able to lift leg independently and hold equal to or more than 3 seconds   Total Score 51  [place] Adult PT Treatment/Exercise - 01/29/15 0001    Knee/Hip Exercises: Stretches   Piriformis Stretch 2 reps;30 seconds   Piriformis Stretch Limitations seated   Knee/Hip Exercises: Standing   Heel Raises 1 set;20 reps   Heel Raises Limitations heel and toe raises on airex intermittent HHA   Forward Lunges Both;1 set;10 reps   Forward Lunges Limitations onto floor, U HHA    Side Lunges Both;10 reps   Side Lunges Limitations floor   Lateral Step Up Both;15 reps;Hand Hold: 1;Step Height: 6"   Functional Squat 2 sets;10 reps   Rocker Board 2 minutes   Rocker Board Limitations A/P and R/L no HHA   SLS t 4", Rt 3" max of 5   Gait Training Gait training 552 feet SBA with min cueing for arm swing with opposite LE for more normalized gait mechanics.   Other Standing Knee Exercises 3D hip excursions split stance 1x10                  PT Short Term Goals - 01/29/15 0810    PT SHORT TERM GOAL #1   Title Patient will improve his score on the Berg balance test to at least 53 in order to reduce overall fall risk    Baseline 01/29/2015 51/56   Status On-going   PT SHORT TERM GOAL #2   Title Patient will muscle strength of all tested muscles in bilateral lower extremities to at least 4/5 in order to improve overall stability and reduce fall risk   Status Achieved   PT SHORT TERM GOAL #3   Title Patient will improve bilateral hip ER to at least 40 degrees, bilateral hip IR to at least 40 degrees, and bilateral ankle  dorsiflexion to 20 degrees  in order to improve overall mechanics and balance reaction skills   Baseline 01/29/2015 ER/IR goals met, progressing Dorsiflexion Bil   Status Partially Met   PT SHORT TERM GOAL #4   Title Patient will be able to ambulate over an uneven, unpredictable surface for at least 122f with no balance loss, no ankle injury, no assistive device, and no falls reported    Baseline Reports improved gait confidence with balance on uneven surface   Status Achieved           PT Long Term Goals - 01/29/15 01165   PT LONG TERM GOAL #1   Title Patient will be independent in correctly and consistently performing appropriate advanced HEP    Baseline 01/29/2015 reports compliance 5x a week   Status Achieved   PT LONG TERM GOAL #2   Title Patient will be able to perform single leg stance for at least 30 seconds with bilateral lower extremities, and will be able to maintain tandem stance for at least 60 seconds without loss of balance    Status On-going   PT LONG TERM GOAL #3   Title Patient will report that he is able to do his daily tasks and work around his property without fear of falling, also with no ankle injuries or falls reported    Baseline Reports no fear of falling and ability to complete daily tasks without difficulty   Status Achieved   PT LONG TERM GOAL #4   Title Patient will demonstrate at least 4+/5 strength in bilateral lower extremities and proximal muscles in order to enhance overall stability and reduce fall risk   Status On-going  Plan - 01/29/15 0855    Clinical Impression Statement Reviewed goals, BERG balance test and MMT complete prior MD apt.  Pt improving functinal strengthening, balance and confidence with gait.  Pt with improved gait mechanics with ability to demonstrate equalized stride length and heel to toe with minimal cueing required for arm swing with opposite LE heel strike.  Pt limited dorsiflexion ROM and strengthening.   Pt reported ability to walk outside on uneven ground with no AD and no LOB episodes without any difficulty.  Pt continues to show risk of falls with abilty to SLS 3" Bil LE and min assistnace required on dynamic surfaces as well as LE weakness with difficulty completting stiars.  BERG balance score 51/56 and average MMT score of 4/5.  Pt will continue to benefit from skilled intervention to improve balance, functional strengtehning and gait mechanics.   PT Next Visit Plan Continues current PT POC to improve gait mechanics, balance activities on dynamic surfaces and functional strengthening.  Begin step down stairs, reciprocal stair training, increase SLS activities/ dynamic balance activites and gastroc stretches and strengthening.          Problem List Patient Active Problem List   Diagnosis Date Noted  . H/O prostate cancer 08/20/2014  . Radiation proctitis 08/19/2014  . H/O agent Orange exposure 07/01/2014  . Unstable angina 06/16/2014  . Shingles outbreak, right anterior thoracic 01/28/2014  . Abnormality of gait 12/18/2013  . Viral gastroenteritis 11/26/2013  . Dyslipidemia 02/12/2013  . Peripheral neuropathy 10/14/2012  . CAD (coronary artery disease) 10/14/2012  . HTN (hypertension) 10/14/2012  . Hx of adenomatous colonic polyps 07/26/2012  . Folic acid deficiency 48/27/0786  . Iron deficiency 05/02/2012  . Cancer of ascending colon 03/29/2012  . GERD 03/09/2010  . PREMATURE VENTRICULAR CONTRACTIONS 04/30/2009   Aldona Lento, PTA  Aldona Lento 01/29/2015, 11:25 AM  [place], [place], [address] Phone: [phone]   Fax:  [phone]

## 2015-01-31 ENCOUNTER — Ambulatory Visit (HOSPITAL_COMMUNITY): Payer: TRICARE For Life (TFL) | Admitting: Physical Therapy

## 2015-02-04 ENCOUNTER — Ambulatory Visit (HOSPITAL_COMMUNITY): Payer: Medicare Other | Admitting: Physical Therapy

## 2015-02-04 DIAGNOSIS — R2689 Other abnormalities of gait and mobility: Secondary | ICD-10-CM | POA: Diagnosis not present

## 2015-02-04 DIAGNOSIS — R293 Abnormal posture: Secondary | ICD-10-CM

## 2015-02-04 DIAGNOSIS — R29898 Other symptoms and signs involving the musculoskeletal system: Secondary | ICD-10-CM

## 2015-02-04 DIAGNOSIS — Z9181 History of falling: Secondary | ICD-10-CM

## 2015-02-04 DIAGNOSIS — M6281 Muscle weakness (generalized): Secondary | ICD-10-CM

## 2015-02-04 NOTE — Therapy (Signed)
[place], [place], [address] Phone: [phone]   Fax:  [phone]  Physical Therapy Treatment  Patient Details  Name: David Pugh MRN: [id number] Date of Birth: 04-Jun-1935 Referring Provider:  [name]., MD  Encounter Date: 02/04/2015      PT End of Session - 02/04/15 1553    Visit Number 12   Number of Visits 16   Date for PT Re-Evaluation 02/10/15   Authorization Type [employer]    Authorization Time Period 12/11/14 to 02/10/15   Authorization - Visit Number 12   Authorization - Number of Visits 16   PT Start Time 5456   PT Stop Time 1601   PT Time Calculation (min) 44 min   Equipment Utilized During Treatment Gait belt   Activity Tolerance Patient tolerated treatment well   Behavior During Therapy [facility] for tasks assessed/performed      Past Medical History  Diagnosis Date  . Prostate cancer 2010    s/p radiation, surgery  . Radiation Feb 2011    treatment  . Biliary dyskinesia   . GERD (gastroesophageal reflux disease)   . Hiatal hernia 2010    tiny  . HTN (hypertension)   . Rosacea   . PTSD (post-traumatic stress disorder)   . Diverticula of colon 2010  . Colon polyps     tubular adenomas  . Arthritis   . Exposure to [employer]   . CAD (coronary artery disease) 2012    a. hx of stent x2 in ramus/OM and mid LAD in 2012  b. balloon angioplaty of OM  . Peripheral neuropathy   . Anemia     hx of anemia as a child   . Colon cancer 12/2011    s/p right hemicolectomy, did not tolerate chemo  . Iron deficiency 05/02/2012    followed by Dr. Tressie [name], iron infusions.   . Folic acid deficiency   . Heart palpitations 10/2012  . Gastroparesis 04/12/2013    borderline  . Myocardial infarction 2012  . Glaucoma   . Abnormality of gait 12/18/2013  . Radiation proctitis     Past Surgical History  Procedure Laterality Date  . Cholecystectomy    . Prostatectomy    . Tonsillectomy    .  Transurethral resection of prostate    . Esophagogastroduodenoscopy  10/10/2008    Dr. Tilda Burrow hiatal hernia, normal esophagus, normal stomach  . Colonoscopy  02/12/2004    Dr. Gala Romney- L side diverticula, inflammatory  colon polyps  . Esophagogastroduodenoscopy  02/12/2004    Dr. Dudley Major- normal   . Coronary stent placement  01/2011    2.5x92m Xience drug-eluting stent in ramus intermedius/OMI vessel, 2.5x680mPromus Element stent in mid LAD artery  . 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 Multiple colonic  polyps removed as described above. Sigmoid Diverticulosis/ 1.5 x 2 cm relatively flat ulcerated lesion in cecum/path showed adenocarcinoma of the colon. descending colon polyp with tubular adenoma and one fragment of focal high grade dysplasia.   . Knee surgery      left knee   . Partial colectomy  02/29/2012    Procedure: PARTIAL COLECTOMY;  Surgeon: HaAdin HectorMD;  [facility]: [facility];  Service: General;  Laterality: Right;  . Vasectomy    . Colonoscopy  08/28/2012    RMYBW:LSLHTDSKAroctitis. Colonic diverticulosis/Ulcerations at the surgical anastomosis  path: ulcerated colonic mucosa with prolapse changes, tubular  adenoma.   . Cardiac catheterization  02/13/2011    [facility] cardiology  . Cataract extraction      2011  . Colonoscopy N/A 08/02/2013    Procedure: COLONOSCOPY;  Surgeon: Daneil Dolin, MD;  [facility];  Service: Endoscopy;  Laterality: N/A;  1200  . Cataract extraction w/phaco Right 08/06/2013    Procedure: CATARACT EXTRACTION PHACO AND INTRAOCULAR LENS PLACEMENT (IOC);  Surgeon: Tonny Branch, MD;  Location: [facility] OR[facility];  Service: Ophthalmology;  Laterality: Right;  CDE:20.96  . Coronary angioplasty  06/17/14    OM1 POBA  . Left heart catheterization with coronary angiogram N/A 06/17/2014    Procedure: LEFT HEART CATHETERIZATION WITH CORONARY ANGIOGRAM;  Surgeon: Burnell Blanks, MD;  Location[facility] CATH LAB;  Service: Cardiovascular;  Laterality: N/A;    There were no vitals filed for this visit.  Visit Diagnosis:  Poor balance  Proximal muscle weakness  Poor posture  History of recent fall  Weakness of both legs      Subjective Assessment - 02/04/15 1519    Subjective Patient reports he is having no pain today, states he has been able to walk over uneven surfaces multiple times without a cane recently. Continues to state that he has difficulty with stairs.    Pertinent History Over the past 3-4 years, patient has been less and less sure on his feet; has been stumbling on level surfaces but has been able to catch himself each time. 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 Scheduled for agent orange [facility] next week.    Currently in Pain? No/denies                         [facility] Adult PT Treatment/Exercise - 02/04/15 0001    Knee/Hip Exercises: Stretches   Active Hamstring Stretch 3 reps;30 seconds   Active Hamstring Stretch Limitations 14 inch box, 3 way    Piriformis Stretch 2 reps;30 seconds   Piriformis Stretch Limitations seated   Gastroc Stretch 3 reps;30 seconds   Gastroc Stretch Limitations slant board   Knee/Hip Exercises: Aerobic   Stationary Bike Nustep seat 10, hills #3, level 5 for 10 minutes    Knee/Hip Exercises: Standing   Lateral Step Up Both;1 set;15 reps   Lateral Step Up Limitations 6 inch box    Forward Step Up Both;1 set;10 reps   Forward Step Up Limitations 4 inch box    Gait Training Stair training on 4 and 7 inch steps with no HHA, focus on reducing circumduction, improving heel clearance, and regulating pace to assist in improving balance during stair navigation              Balance Exercises - 02/04/15 1540    Balance Exercises: Standing   Standing, One Foot on a Step Eyes open;Eyes closed;4 inch;4 reps;10 secs   Tandem Gait  Forward;Foam/compliant surface;3 reps   Other Standing Exercises Body blade 1x30 seconds on foam and on floor in SLS and tandem stances            PT Education - 02/04/15 1606    Education provided Yes   Education Details patient education to slow down pace during balance tasks to reduce risk of fall    Person(s) Educated Patient   Methods Explanation   Comprehension Verbalized understanding          PT Short Term Goals - 01/29/15 [id number]  PT SHORT TERM GOAL #1   Title Patient will improve his score on the Berg balance test to at least 53 in order to reduce overall fall risk    Baseline 01/29/2015 51/56   Status On-going   PT SHORT TERM GOAL #2   Title Patient will muscle strength of all tested muscles in bilateral lower extremities to at least 4/5 in order to improve overall stability and reduce fall risk   Status Achieved   PT SHORT TERM GOAL #3   Title Patient will improve bilateral hip ER to at least 40 degrees, bilateral hip IR to at least 40 degrees, and bilateral ankle dorsiflexion to 20 degrees  in order to improve overall mechanics and balance reaction skills   Baseline 01/29/2015 ER/IR goals met, progressing Dorsiflexion Bil   Status Partially Met   PT SHORT TERM GOAL #4   Title Patient will be able to ambulate over an uneven, unpredictable surface for at least 133f with no balance loss, no ankle injury, no assistive device, and no falls reported    Baseline Reports improved gait confidence with balance on uneven surface   Status Achieved           PT Long Term Goals - 01/29/15 09450   PT LONG TERM GOAL #1   Title Patient will be independent in correctly and consistently performing appropriate advanced HEP    Baseline 01/29/2015 reports compliance 5x a week   Status Achieved   PT LONG TERM GOAL #2   Title Patient will be able to perform single leg stance for at least 30 seconds with bilateral lower extremities, and will be able to maintain tandem stance for at  least 60 seconds without loss of balance    Status On-going   PT LONG TERM GOAL #3   Title Patient will report that he is able to do his daily tasks and work around his property without fear of falling, also with no ankle injuries or falls reported    Baseline Reports no fear of falling and ability to complete daily tasks without difficulty   Status Achieved   PT LONG TERM GOAL #4   Title Patient will demonstrate at least 4+/5 strength in bilateral lower extremities and proximal muscles in order to enhance overall stability and reduce fall risk   Status On-going               Plan - 02/04/15 1553    Clinical Impression Statement Continued functional stretching, functional strength, and functional balance tasks today. Patient continues to display difficulty with balance tasks and displays tendency to speed up to attempt to get through task without falling, which in actuality increases risk of potential fall. Initiated stair traiinng today during which patient demonstrated  difficulty with stairs apparently primarily due to balance impairments, especially with stair descent.    Rehab Potential Good   PT Frequency 2x / week   PT Duration 8 weeks   PT Treatment/Interventions ADLs/Self Care Home Management;Gait training;Neuromuscular re-education;Stair training;Functional mobility training;Patient/family education;Therapeutic activities;Therapeutic exercise;Manual techniques;Energy conservation;Balance training   PT Next Visit Plan Continues current PT POC to improve gait mechanics, balance activities on dynamic surfaces and functional strengthening.  Continue reciprocal stair training, increase SLS activities/ dynamic balance activites and gastroc stretches and strengthening.     PT Home Exercise Plan ankle DF/PF/inversion/eversion, standing hip ABD, standing marches with 2 second hold    Consulted and Agree with Plan of Care Patient        Problem  List Patient Active Problem List    Diagnosis Date Noted  . H/O prostate cancer 08/20/2014  . Radiation proctitis 08/19/2014  . H/O agent Orange exposure 07/01/2014  . Unstable angina 06/16/2014  . Shingles outbreak, right anterior thoracic 01/28/2014  . Abnormality of gait 12/18/2013  . Viral gastroenteritis 11/26/2013  . Dyslipidemia 02/12/2013  . Peripheral neuropathy 10/14/2012  . CAD (coronary artery disease) 10/14/2012  . HTN (hypertension) 10/14/2012  . Hx of adenomatous colonic polyps 07/26/2012  . Folic acid deficiency 23/00/9794  . Iron deficiency 05/02/2012  . Cancer of ascending colon 03/29/2012  . GERD 03/09/2010  . PREMATURE VENTRICULAR CONTRACTIONS 04/30/2009   Deniece Ree PT, DPT [phone]  [place] [address] [place], [place], [address] Phone: [phone]   Fax:  [phone]

## 2015-02-05 ENCOUNTER — Other Ambulatory Visit: Payer: Self-pay | Admitting: Cardiovascular Disease

## 2015-02-05 ENCOUNTER — Other Ambulatory Visit: Payer: Self-pay | Admitting: Physician Assistant

## 2015-02-05 NOTE — Telephone Encounter (Signed)
Please review for refill, Thank you. 

## 2015-02-05 NOTE — Telephone Encounter (Signed)
Rx(s) sent to pharmacy electronically.  

## 2015-02-06 ENCOUNTER — Ambulatory Visit (HOSPITAL_COMMUNITY): Payer: Medicare Other | Admitting: Physical Therapy

## 2015-02-06 DIAGNOSIS — M6281 Muscle weakness (generalized): Secondary | ICD-10-CM

## 2015-02-06 DIAGNOSIS — R2689 Other abnormalities of gait and mobility: Secondary | ICD-10-CM

## 2015-02-06 DIAGNOSIS — R293 Abnormal posture: Secondary | ICD-10-CM

## 2015-02-06 DIAGNOSIS — R29898 Other symptoms and signs involving the musculoskeletal system: Secondary | ICD-10-CM

## 2015-02-06 DIAGNOSIS — Z9181 History of falling: Secondary | ICD-10-CM

## 2015-02-06 NOTE — Therapy (Signed)
[place], [place], [address] Phone: [phone]   Fax:  [phone]  Physical Therapy Treatment (Re-Cert)  Patient Details  Name: David Pugh MRN: [id number] Date of Birth: 1934-11-24 Referring Provider:  [name]., MD  Encounter Date: 02/06/2015      PT End of Session - 02/06/15 1106    Visit Number 13   Number of Visits 16   Date for PT Re-Evaluation 03/06/15   Authorization Type [employer]    Authorization Time Period 12/11/14 to 02/10/15   Authorization - Visit Number 13   Authorization - Number of Visits 16   PT Start Time 0917   PT Stop Time 1007   PT Time Calculation (min) 50 min   Activity Tolerance Patient tolerated treatment well   Behavior During Therapy [facility] for tasks assessed/performed      Past Medical History  Diagnosis Date  . Prostate cancer 2010    s/p radiation, surgery  . Radiation Feb 2011    treatment  . Biliary dyskinesia   . GERD (gastroesophageal reflux disease)   . Hiatal hernia 2010    tiny  . HTN (hypertension)   . Rosacea   . PTSD (post-traumatic stress disorder)   . Diverticula of colon 2010  . Colon polyps     tubular adenomas  . Arthritis   . Exposure to [employer]   . CAD (coronary artery disease) 2012    a. hx of stent x2 in ramus/OM and mid LAD in 2012  b. balloon angioplaty of OM  . Peripheral neuropathy   . Anemia     hx of anemia as a child   . Colon cancer 12/2011    s/p right hemicolectomy, did not tolerate chemo  . Iron deficiency 05/02/2012    followed by Dr. Tressie [name], iron infusions.   . Folic acid deficiency   . Heart palpitations 10/2012  . Gastroparesis 04/12/2013    borderline  . Myocardial infarction 2012  . Glaucoma   . Abnormality of gait 12/18/2013  . Radiation proctitis     Past Surgical History  Procedure Laterality Date  . Cholecystectomy    . Prostatectomy    . Tonsillectomy    . Transurethral resection of prostate     . Esophagogastroduodenoscopy  10/10/2008    Dr. Tilda Burrow hiatal hernia, normal esophagus, normal stomach  . Colonoscopy  02/12/2004    Dr. Gala Romney- L side diverticula, inflammatory  colon polyps  . Esophagogastroduodenoscopy  02/12/2004    Dr. Dudley Major- normal   . Coronary stent placement  01/2011    2.5x7m Xience drug-eluting stent in ramus intermedius/OMI vessel, 2.5x63mPromus Element stent in mid LAD artery  . 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 Multiple colonic  polyps removed as described above. Sigmoid Diverticulosis/ 1.5 x 2 cm relatively flat ulcerated lesion in cecum/path showed adenocarcinoma of the colon. descending colon polyp with tubular adenoma and one fragment of focal high grade dysplasia.   . Knee surgery      left knee   . Partial colectomy  02/29/2012    Procedure: PARTIAL COLECTOMY;  Surgeon: HaAdin HectorMD;  Location: [facility];  Service: General;  Laterality: Right;  . Vasectomy    . Colonoscopy  08/28/2012    RMRXY:[id number]. Colonic diverticulosis/Ulcerations at the surgical anastomosis  path: ulcerated colonic mucosa with prolapse changes, tubular adenoma.   . Cardiac catheterization  02/13/2011    [facility], [facility] cardiology  . Cataract extraction      2011  . Colonoscopy N/A 08/02/2013    Procedure: COLONOSCOPY;  Surgeon: Daneil Dolin, MD;  Location: [facility];  Service: Endoscopy;  Laterality: N/A;  1200  . Cataract extraction w/phaco Right 08/06/2013    Procedure: CATARACT EXTRACTION PHACO AND INTRAOCULAR LENS PLACEMENT (IOC);  Surgeon: Tonny Branch, MD;  Location: [facility] ORS;  Service: Ophthalmology;  Laterality: Right;  CDE:20.96  . Coronary angioplasty  06/17/14    OM1 POBA  . Left heart catheterization with coronary angiogram N/A 06/17/2014    Procedure: LEFT HEART CATHETERIZATION WITH CORONARY ANGIOGRAM;  Surgeon: Burnell Blanks, MD;  Location: [facility] CATH  LAB;  Service: Cardiovascular;  Laterality: N/A;    There were no vitals filed for this visit.  Visit Diagnosis:  Poor balance - Plan: PT plan of care cert/re-cert  Proximal muscle weakness - Plan: PT plan of care cert/re-cert  Poor posture - Plan: PT plan of care cert/re-cert  History of recent fall - Plan: PT plan of care cert/re-cert  Weakness of both legs - Plan: PT plan of care cert/re-cert      Subjective Assessment - 02/06/15 0919    Subjective Patient reports that he is not having any pain today, also states that he was able to walk all over his land without a problem, which he has not been able to do for years.    Pertinent History Over the past 3-4 years, patient has been less and less sure on his feet; has been stumbling on level surfaces but has been able to catch himself each time. 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 [facility].    Patient Stated Goals to get balance better in general    Currently in Pain? No/denies            [facility] PT Assessment - 02/06/15 0001    Assessment   Medical Diagnosis balance deficits    Onset Date/Surgical Date 12/11/10   Next MD Visit Heart doctor in 2 weeks, [place] July 15th, [place] 3 months    Prior Function   Level of Independence Independent with basic ADLs;Independent with homemaking with ambulation;Independent with gait;Independent with transfers   Vocation Retired   Leisure skeet shooting    Observation/Other Assessments   Focus on Therapeutic Outcomes ([place])  11% limited                      [place] Adult PT Treatment/Exercise - 02/06/15 0001    Knee/Hip Exercises: Stretches   Active Hamstring Stretch 3 reps;30 seconds   Active Hamstring Stretch Limitations on stairs    Hip Flexor Stretch 3 reps;30 seconds   Hip Flexor Stretch Limitations on stairs    Piriformis Stretch 3 reps;30 seconds   Piriformis Stretch Limitations  seated   Gastroc Stretch 3 reps;30 seconds   Gastroc Stretch Limitations slant board   Knee/Hip Exercises: Standing   Lateral Step Up Both;1 set;10 reps   Lateral Step Up Limitations 7 inch step    Forward Step Up Both;1 set;10 reps   Forward Step Up Limitations 7 inch step    Step Down Both;1 set;10 reps   Step Down Limitations 4 inch step    Rocker Board Limitations A/P and R/L no HHA, 20x each direction    Gait Training Stair training on 4 inch  steps with U HHA and focus on slow, steady measured pace to reduce unsteadiness and LOB on steps              Balance Exercises - 02/06/15 0948    Balance Exercises: Standing   SLS Eyes open;4 reps;10 secs;Solid surface;Foam/compliant surface           PT Education - 02/06/15 1104    Education provided Yes   Education Details patient education regarding plan of care moving forward, slowing down with balance tasks and on stairs    Person(s) Educated Patient   Methods Explanation   Comprehension Verbalized understanding          PT Short Term Goals - 01/29/15 0810    PT SHORT TERM GOAL #1   Title Patient will improve his score on the Berg balance test to at least 53 in order to reduce overall fall risk    Baseline 01/29/2015 51/56   Status On-going   PT SHORT TERM GOAL #2   Title Patient will muscle strength of all tested muscles in bilateral lower extremities to at least 4/5 in order to improve overall stability and reduce fall risk   Status Achieved   PT SHORT TERM GOAL #3   Title Patient will improve bilateral hip ER to at least 40 degrees, bilateral hip IR to at least 40 degrees, and bilateral ankle dorsiflexion to 20 degrees  in order to improve overall mechanics and balance reaction skills   Baseline 01/29/2015 ER/IR goals met, progressing Dorsiflexion Bil   Status Partially Met   PT SHORT TERM GOAL #4   Title Patient will be able to ambulate over an uneven, unpredictable surface for at least 168f with no balance  loss, no ankle injury, no assistive device, and no falls reported    Baseline Reports improved gait confidence with balance on uneven surface   Status Achieved           PT Long Term Goals - 01/29/15 07169   PT LONG TERM GOAL #1   Title Patient will be independent in correctly and consistently performing appropriate advanced HEP    Baseline 01/29/2015 reports compliance 5x a week   Status Achieved   PT LONG TERM GOAL #2   Title Patient will be able to perform single leg stance for at least 30 seconds with bilateral lower extremities, and will be able to maintain tandem stance for at least 60 seconds without loss of balance    Status On-going   PT LONG TERM GOAL #3   Title Patient will report that he is able to do his daily tasks and work around his property without fear of falling, also with no ankle injuries or falls reported    Baseline Reports no fear of falling and ability to complete daily tasks without difficulty   Status Achieved   PT LONG TERM GOAL #4   Title Patient will demonstrate at least 4+/5 strength in bilateral lower extremities and proximal muscles in order to enhance overall stability and reduce fall risk   Status On-going               Plan - 02/06/15 1107    Clinical Impression Statement Discussed patient's progress today; patient reports he is noticing drastic changes in his balance and overall mobilty, but does continue to have some difficulty with balance on stairs. Patient also continues to demonstrate some muscle weakness and tightness, reduced balance and balance reaaction skils, and difficulty with pacing during balance  tasks, which does put him at higher risk of a fall. Continued functional stretches and exercises today  with emphasis on SLS balance and on stair based activities today. Pt continues to show risk of falls with abilty to SLS 3" Bil LE and min assistnace required on dynamic surfaces as well as LE weakness with difficulty completting stiars.  BERG balance score 51/56 and average MMT score of 4/5. Pt will continue to benefit from skilled intervention to improve balance, functional strengtehning and gait mechanics.  Pt continues to show risk of falls with abilty to SLS 3" Bil LE and min assistnace required on dynamic surfaces as well as LE weakness with difficulty completting stiars. BERG balance score 51/56 and average MMT score of 4/5. Pt will continue to benefit from skilled intervention for approximately 4 more weeks, at 2x/week,  to improve balance, functional strengtehning and gait mechanics.     Pt will benefit from skilled therapeutic intervention in order to improve on the following deficits Abnormal gait;Decreased activity tolerance;Decreased strength;Decreased balance;Decreased mobility;Decreased range of motion;Decreased coordination;Postural dysfunction   Rehab Potential Good   PT Frequency 2x / week   PT Duration 4 weeks   PT Treatment/Interventions ADLs/Self Care Home Management;Gait training;Neuromuscular re-education;Stair training;Functional mobility training;Patient/family education;Therapeutic activities;Therapeutic exercise;Manual techniques;Energy conservation;Balance training   PT Next Visit Plan Continues current PT POC to improve gait mechanics, balance activities on dynamic surfaces and functional strengthening.  Continue reciprocal stair training, increase SLS activities/ dynamic balance activites and gastroc stretches and strengthening.     PT Home Exercise Plan ankle DF/PF/inversion/eversion, standing hip ABD, standing marches with 2 second hold    Consulted and Agree with Plan of Care Patient          G-Codes - Mar 03, 2015 1120    Functional Assessment Tool Used FOTO 11% limited on March 03, 2015   Functional Limitation Mobility: Walking and moving around   Mobility: Walking and Moving Around Current Status [phone]) At least 1 percent but less than 20 percent impaired, limited or restricted   Mobility: Walking and  Moving Around Goal Status [phone]) 0 percent impaired, limited or restricted      Problem List Patient Active Problem List   Diagnosis Date Noted  . H/O prostate cancer 08/20/2014  . Radiation proctitis 08/19/2014  . H/O agent Orange exposure 07/01/2014  . Unstable angina 06/16/2014  . Shingles outbreak, right anterior thoracic 01/28/2014  . Abnormality of gait 12/18/2013  . Viral gastroenteritis 11/26/2013  . Dyslipidemia 02/12/2013  . Peripheral neuropathy 10/14/2012  . CAD (coronary artery disease) 10/14/2012  . HTN (hypertension) 10/14/2012  . Hx of adenomatous colonic polyps 07/26/2012  . Folic acid deficiency 82/95/6213  . Iron deficiency 05/02/2012  . Cancer of ascending colon 03/29/2012  . GERD 03/09/2010  . PREMATURE VENTRICULAR CONTRACTIONS 04/30/2009    Deniece Ree PT, DPT [phone]  [place] [address] [place], [place], [address] Phone: [phone]   Fax:  [phone]

## 2015-02-06 NOTE — Telephone Encounter (Signed)
E-SENT TO PHARMACY #[phone] X [phone] REFILLS

## 2015-02-11 ENCOUNTER — Ambulatory Visit (HOSPITAL_COMMUNITY): Payer: Medicare Other | Admitting: Physical Therapy

## 2015-02-11 DIAGNOSIS — R2689 Other abnormalities of gait and mobility: Secondary | ICD-10-CM

## 2015-02-11 DIAGNOSIS — R29898 Other symptoms and signs involving the musculoskeletal system: Secondary | ICD-10-CM

## 2015-02-11 DIAGNOSIS — M6281 Muscle weakness (generalized): Secondary | ICD-10-CM

## 2015-02-11 DIAGNOSIS — R293 Abnormal posture: Secondary | ICD-10-CM

## 2015-02-11 DIAGNOSIS — Z9181 History of falling: Secondary | ICD-10-CM

## 2015-02-11 NOTE — Therapy (Signed)
[place], [place], [address] Phone: [phone]   Fax:  [phone]  Physical Therapy Treatment  Patient Details  Name: David Pugh MRN: [id number] Date of Birth: Apr 11, 1935 Referring Provider:  [name]., MD  Encounter Date: 02/11/2015      PT End of Session - 02/11/15 1601    Visit Number 14   Number of Visits 16   Date for PT Re-Evaluation 03/06/15   Authorization Type [employer]    Authorization Time Period 12/11/14 to 02/10/15   Authorization - Visit Number 14   Authorization - Number of Visits 16   PT Start Time 7867   PT Stop Time 1600   PT Time Calculation (min) 42 min   Equipment Utilized During Treatment Gait belt   Activity Tolerance Patient tolerated treatment well   Behavior During Therapy [facility] for tasks assessed/performed      Past Medical History  Diagnosis Date  . Prostate cancer 2010    s/p radiation, surgery  . Radiation Feb 2011    treatment  . Biliary dyskinesia   . GERD (gastroesophageal reflux disease)   . Hiatal hernia 2010    tiny  . HTN (hypertension)   . Rosacea   . PTSD (post-traumatic stress disorder)   . Diverticula of colon 2010  . Colon polyps     tubular adenomas  . Arthritis   . Exposure to [employer]   . CAD (coronary artery disease) 2012    a. hx of stent x2 in ramus/OM and mid LAD in 2012  b. balloon angioplaty of OM  . Peripheral neuropathy   . Anemia     hx of anemia as a child   . Colon cancer 12/2011    s/p right hemicolectomy, did not tolerate chemo  . Iron deficiency 05/02/2012    followed by Dr. Tressie [name], iron infusions.   . Folic acid deficiency   . Heart palpitations 10/2012  . Gastroparesis 04/12/2013    borderline  . Myocardial infarction 2012  . Glaucoma   . Abnormality of gait 12/18/2013  . Radiation proctitis     Past Surgical History  Procedure Laterality Date  . Cholecystectomy    . Prostatectomy    . Tonsillectomy    .  Transurethral resection of prostate    . Esophagogastroduodenoscopy  10/10/2008    Dr. Tilda Burrow hiatal hernia, normal esophagus, normal stomach  . Colonoscopy  02/12/2004    Dr. Gala Romney- L side diverticula, inflammatory  colon polyps  . Esophagogastroduodenoscopy  02/12/2004    Dr. Dudley Major- normal   . Coronary stent placement  01/2011    2.5x21m Xience drug-eluting stent in ramus intermedius/OMI vessel, 2.5x654mPromus Element stent in mid LAD artery  . 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 Multiple colonic  polyps removed as described above. Sigmoid Diverticulosis/ 1.5 x 2 cm relatively flat ulcerated lesion in cecum/path showed adenocarcinoma of the colon. descending colon polyp with tubular adenoma and one fragment of focal high grade dysplasia.   . Knee surgery      left knee   . Partial colectomy  02/29/2012    Procedure: PARTIAL COLECTOMY;  Surgeon: HaAdin HectorMD;  Location: [facility];  Service: General;  Laterality: Right;  . Vasectomy    . Colonoscopy  08/28/2012    RMEHM:[id number]. Colonic diverticulosis/Ulcerations at the surgical anastomosis  path: ulcerated colonic mucosa with prolapse changes, tubular  adenoma.   . Cardiac catheterization  02/13/2011    [facility] cardiology  . Cataract extraction      2011  . Colonoscopy N/A 08/02/2013    Procedure: COLONOSCOPY;  Surgeon: Daneil Dolin, MD;  Location: [facility];  Service: Endoscopy;  Laterality: N/A;  1200  . Cataract extraction w/phaco Right 08/06/2013    Procedure: CATARACT EXTRACTION PHACO AND INTRAOCULAR LENS PLACEMENT (IOC);  Surgeon: Tonny Branch, MD;  Location: [facility] OR[facility];  Service: Ophthalmology;  Laterality: Right;  CDE:20.96  . Coronary angioplasty  06/17/14    OM1 POBA  . Left heart catheterization with coronary angiogram N/A 06/17/2014    Procedure: LEFT HEART CATHETERIZATION WITH CORONARY ANGIOGRAM;  Surgeon: Burnell Blanks, MD;  Location: [facility] CATH LAB;  Service: Cardiovascular;  Laterality: N/A;    There were no vitals filed for this visit.  Visit Diagnosis:  Poor balance  Proximal muscle weakness  Poor posture  History of recent fall  Weakness of both legs      Subjective Assessment - 02/11/15 1523    Subjective Patient reports he is doing OK today, had a toothache that he had to get fixed, had a good weekend. States that he feels like getting better balance and safety on stair really seems to be his last big hurdle to conquer.    Pertinent History Over the past 3-4 years, patient has been less and less sure on his feet; has been stumbling on level surfaces but has been able to catch himself each time. 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 [facility].    Patient Stated Goals to get balance better in general    Currently in Pain? No/denies                         [facility] Adult PT Treatment/Exercise - 02/11/15 0001    Knee/Hip Exercises: Stretches   Active Hamstring Stretch 3 reps;30 seconds   Active Hamstring Stretch Limitations on stairs    Hip Flexor Stretch 30 seconds;2 reps   Hip Flexor Stretch Limitations on stairs    Piriformis Stretch 30 seconds;2 reps   Piriformis Stretch Limitations seated   Gastroc Stretch 3 reps;30 seconds   Gastroc Stretch Limitations slant board   Knee/Hip Exercises: Standing   Lateral Step Up Both;1 set;10 reps   Lateral Step Up Limitations 7 inch step    Forward Step Up Both;10 reps;2 sets   Forward Step Up Limitations 7 inch step    Step Down Both;1 set;15 reps   Step Down Limitations 4 inch step    Gait Training Stair training on 4 and 7 inch steps with U HHA and focus on slow, steady measured pace to reduce unsteadiness and LOB on steps    Other Standing Knee Exercises Eccentric sit to stands 1x10 with 5 second holds    Other Standing Knee Exercises  3D hip excursions toe touch 1x10; standing hip extension and ABD on foam pad 1x10             Balance Exercises - 02/11/15 1526    Balance Exercises: Standing   SLS Eyes open;Eyes closed;Foam/compliant surface;4 reps;15 secs   Other Standing Exercises Star reaches 1x5 each side with emphasis on SLS   walking on tip toes and on heels x2 laps with Min guard  PT Education - 02/11/15 1601    Education provided Yes   Education Details continued education regarding slowing down with stair tasks to promote better balance and improve safety    Person(s) Educated Patient   Methods Explanation   Comprehension Verbalized understanding          PT Short Term Goals - 01/29/15 0810    PT SHORT TERM GOAL #1   Title Patient will improve his score on the Berg balance test to at least 53 in order to reduce overall fall risk    Baseline 01/29/2015 51/56   Status On-going   PT SHORT TERM GOAL #2   Title Patient will muscle strength of all tested muscles in bilateral lower extremities to at least 4/5 in order to improve overall stability and reduce fall risk   Status Achieved   PT SHORT TERM GOAL #3   Title Patient will improve bilateral hip ER to at least 40 degrees, bilateral hip IR to at least 40 degrees, and bilateral ankle dorsiflexion to 20 degrees  in order to improve overall mechanics and balance reaction skills   Baseline 01/29/2015 ER/IR goals met, progressing Dorsiflexion Bil   Status Partially Met   PT SHORT TERM GOAL #4   Title Patient will be able to ambulate over an uneven, unpredictable surface for at least 124f with no balance loss, no ankle injury, no assistive device, and no falls reported    Baseline Reports improved gait confidence with balance on uneven surface   Status Achieved           PT Long Term Goals - 01/29/15 0811    PT LONG TERM GOAL #1   Title Patient will be independent in correctly and consistently performing appropriate advanced HEP     Baseline 01/29/2015 reports compliance 5x a week   Status Achieved   PT LONG TERM GOAL #2   Title Patient will be able to perform single leg stance for at least 30 seconds with bilateral lower extremities, and will be able to maintain tandem stance for at least 60 seconds without loss of balance    Status On-going   PT LONG TERM GOAL #3   Title Patient will report that he is able to do his daily tasks and work around his property without fear of falling, also with no ankle injuries or falls reported    Baseline Reports no fear of falling and ability to complete daily tasks without difficulty   Status Achieved   PT LONG TERM GOAL #4   Title Patient will demonstrate at least 4+/5 strength in bilateral lower extremities and proximal muscles in order to enhance overall stability and reduce fall risk   Status On-going               Plan - 02/11/15 1602    Clinical Impression Statement Continued functional stretching and exercises, with focus on LE stablity for improved stair climbing, SLS balance work, and safety during functional tasks such as stairs. Patient is improving with stair work and states that he feels that the last major hurdle he would like therapy's assistance in overcoming is improvnig safe stair performance.    Pt will benefit from skilled therapeutic intervention in order to improve on the following deficits Abnormal gait;Decreased activity tolerance;Decreased strength;Decreased balance;Decreased mobility;Decreased range of motion;Decreased coordination;Postural dysfunction   Rehab Potential Good   PT Frequency 2x / week   PT Duration 4 weeks   PT Treatment/Interventions ADLs/Self Care Home Management;Gait training;Neuromuscular re-education;Stair training;Functional  mobility training;Patient/family education;Therapeutic activities;Therapeutic exercise;Manual techniques;Energy conservation;Balance training   PT Next Visit Plan Continues current PT POC to improve gait  mechanics, balance activities on dynamic surfaces and functional strengthening.  Continue reciprocal stair training with no HHA next session, increase SLS activities/ dynamic balance activites and gastroc stretches and strengthening.     PT Home Exercise Plan ankle DF/PF/inversion/eversion, standing hip ABD, standing marches with 2 second hold    Consulted and Agree with Plan of Care Patient        Problem List Patient Active Problem List   Diagnosis Date Noted  . H/O prostate cancer 08/20/2014  . Radiation proctitis 08/19/2014  . H/O agent Orange exposure 07/01/2014  . Unstable angina 06/16/2014  . Shingles outbreak, right anterior thoracic 01/28/2014  . Abnormality of gait 12/18/2013  . Viral gastroenteritis 11/26/2013  . Dyslipidemia 02/12/2013  . Peripheral neuropathy 10/14/2012  . CAD (coronary artery disease) 10/14/2012  . HTN (hypertension) 10/14/2012  . Hx of adenomatous colonic polyps 07/26/2012  . Folic acid deficiency 56/43/3295  . Iron deficiency 05/02/2012  . Cancer of ascending colon 03/29/2012  . GERD 03/09/2010  . PREMATURE VENTRICULAR CONTRACTIONS 04/30/2009    Deniece Ree PT, DPT [phone]  [place] [address] [place], [place], [address] Phone: [phone]   Fax:  [phone]

## 2015-02-13 ENCOUNTER — Ambulatory Visit (HOSPITAL_COMMUNITY): Payer: Medicare Other | Admitting: Physical Therapy

## 2015-02-13 DIAGNOSIS — R2689 Other abnormalities of gait and mobility: Secondary | ICD-10-CM

## 2015-02-13 DIAGNOSIS — R29898 Other symptoms and signs involving the musculoskeletal system: Secondary | ICD-10-CM

## 2015-02-13 DIAGNOSIS — R293 Abnormal posture: Secondary | ICD-10-CM

## 2015-02-13 DIAGNOSIS — Z9181 History of falling: Secondary | ICD-10-CM

## 2015-02-13 DIAGNOSIS — M6281 Muscle weakness (generalized): Secondary | ICD-10-CM

## 2015-02-13 NOTE — Therapy (Signed)
[place], [place], [address] Phone: [phone]   Fax:  [phone]  Physical Therapy Treatment  Patient Details  Name: David Pugh MRN: [id number] Date of Birth: 1935/06/03 Referring Provider:  [name]., MD  Encounter Date: 02/13/2015      PT End of Session - 02/13/15 0855    Visit Number 15   Number of Visits 20   Date for PT Re-Evaluation 03/06/15   Authorization Type [employer]    Authorization Time Period 12/11/14 to 02/10/15   Authorization - Visit Number 15   Authorization - Number of Visits 20   PT Start Time 0804   PT Stop Time 0848   PT Time Calculation (min) 44 min   Equipment Utilized During Treatment Gait belt   Activity Tolerance Patient tolerated treatment well   Behavior During Therapy [facility] for tasks assessed/performed      Past Medical History  Diagnosis Date  . Prostate cancer 2010    s/p radiation, surgery  . Radiation Feb 2011    treatment  . Biliary dyskinesia   . GERD (gastroesophageal reflux disease)   . Hiatal hernia 2010    tiny  . HTN (hypertension)   . Rosacea   . PTSD (post-traumatic stress disorder)   . Diverticula of colon 2010  . Colon polyps     tubular adenomas  . Arthritis   . Exposure to [employer]   . CAD (coronary artery disease) 2012    a. hx of stent x2 in ramus/OM and mid LAD in 2012  b. balloon angioplaty of OM  . Peripheral neuropathy   . Anemia     hx of anemia as a child   . Colon cancer 12/2011    s/p right hemicolectomy, did not tolerate chemo  . Iron deficiency 05/02/2012    followed by Dr. Tressie [name], iron infusions.   . Folic acid deficiency   . Heart palpitations 10/2012  . Gastroparesis 04/12/2013    borderline  . Myocardial infarction 2012  . Glaucoma   . Abnormality of gait 12/18/2013  . Radiation proctitis     Past Surgical History  Procedure Laterality Date  . Cholecystectomy    . Prostatectomy    . Tonsillectomy    .  Transurethral resection of prostate    . Esophagogastroduodenoscopy  10/10/2008    Dr. Tilda Burrow hiatal hernia, normal esophagus, normal stomach  . Colonoscopy  02/12/2004    Dr. Gala Romney- L side diverticula, inflammatory  colon polyps  . Esophagogastroduodenoscopy  02/12/2004    Dr. Dudley Major- normal   . Coronary stent placement  01/2011    2.5x4m Xience drug-eluting stent in ramus intermedius/OMI vessel, 2.5x626mPromus Element stent in mid LAD artery  . 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 Multiple colonic  polyps removed as described above. Sigmoid Diverticulosis/ 1.5 x 2 cm relatively flat ulcerated lesion in cecum/path showed adenocarcinoma of the colon. descending colon polyp with tubular adenoma and one fragment of focal high grade dysplasia.   . Knee surgery      left knee   . Partial colectomy  02/29/2012    Procedure: PARTIAL COLECTOMY;  Surgeon: HaAdin HectorMD;  Location: [facility];  Service: General;  Laterality: Right;  . Vasectomy    . Colonoscopy  08/28/2012    RMZDG:LOVFIEPPIroctitis. Colonic diverticulosis/Ulcerations at the surgical anastomosis  path: ulcerated colonic mucosa with prolapse changes, tubular  adenoma.   . Cardiac catheterization  02/13/2011    [facility], [facility] cardiology  . Cataract extraction      2011  . Colonoscopy N/A 08/02/2013    Procedure: COLONOSCOPY;  Surgeon: Daneil Dolin, MD;  Location: [facility];  Service: Endoscopy;  Laterality: N/A;  1200  . Cataract extraction w/phaco Right 08/06/2013    Procedure: CATARACT EXTRACTION PHACO AND INTRAOCULAR LENS PLACEMENT (IOC);  Surgeon: Tonny Branch, MD;  Location: [facility] ORS;  Service: Ophthalmology;  Laterality: Right;  CDE:20.96  . Coronary angioplasty  06/17/14    OM1 POBA  . Left heart catheterization with coronary angiogram N/A 06/17/2014    Procedure: LEFT HEART CATHETERIZATION WITH CORONARY ANGIOGRAM;  Surgeon: Burnell Blanks, MD;  Location: [facility] CATH LAB;  Service: Cardiovascular;  Laterality: N/A;    There were no vitals filed for this visit.  Visit Diagnosis:  Poor balance  Proximal muscle weakness  Poor posture  History of recent fall  Weakness of both legs      Subjective Assessment - 02/13/15 0819    Subjective Pt states he feels tight but not having any pain.  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   Currently in Pain? No/denies                         [facility] Adult PT Treatment/Exercise - 02/13/15 0001    Knee/Hip Exercises: Stretches   Active Hamstring Stretch 3 reps;30 seconds   Active Hamstring Stretch Limitations on stairs    Hip Flexor Stretch 30 seconds;2 reps   Hip Flexor Stretch Limitations on stairs    Piriformis Stretch 30 seconds;2 reps   Piriformis Stretch Limitations seated   Gastroc Stretch 3 reps;30 seconds   Gastroc Stretch Limitations slant board   Knee/Hip Exercises: Aerobic   Elliptical 6 minutes level 1   Knee/Hip Exercises: Standing   Forward Lunges Both;1 set;10 reps   Forward Lunges Limitations onto floor, U HHA    Side Lunges Both;10 reps   Side Lunges Limitations floor   Lateral Step Up Both;1 set;10 reps   Lateral Step Up Limitations 7 inch step    Forward Step Up Both;10 reps;2 sets   Forward Step Up Limitations 7 inch step    Step Down Both;1 set;15 reps   Step Down Limitations 4 inch step    Other Standing Knee Exercises 3D hip excursions toe touch 1x10; standing hip extension and ABD on foam pad 1x10                  PT Short Term Goals - 01/29/15 0810    PT SHORT TERM GOAL #1   Title Patient will improve his score on the Berg balance test to at least 53 in order to reduce overall fall risk    Baseline 01/29/2015 51/56   Status On-going   PT SHORT TERM GOAL #2   Title Patient will muscle strength of all tested muscles in bilateral lower extremities to at least 4/5 in  order to improve overall stability and reduce fall risk   Status Achieved   PT SHORT TERM GOAL #3   Title Patient will improve bilateral hip ER to at least 40 degrees, bilateral hip IR to at least 40 degrees, and bilateral ankle dorsiflexion to 20 degrees  in order to improve overall mechanics and balance reaction skills   Baseline 01/29/2015 ER/IR goals met, progressing Dorsiflexion Bil  Status Partially Met   PT SHORT TERM GOAL #4   Title Patient will be able to ambulate over an uneven, unpredictable surface for at least 133f with no balance loss, no ankle injury, no assistive device, and no falls reported    Baseline Reports improved gait confidence with balance on uneven surface   Status Achieved           PT Long Term Goals - 01/29/15 05027   PT LONG TERM GOAL #1   Title Patient will be independent in correctly and consistently performing appropriate advanced HEP    Baseline 01/29/2015 reports compliance 5x a week   Status Achieved   PT LONG TERM GOAL #2   Title Patient will be able to perform single leg stance for at least 30 seconds with bilateral lower extremities, and will be able to maintain tandem stance for at least 60 seconds without loss of balance    Status On-going   PT LONG TERM GOAL #3   Title Patient will report that he is able to do his daily tasks and work around his property without fear of falling, also with no ankle injuries or falls reported    Baseline Reports no fear of falling and ability to complete daily tasks without difficulty   Status Achieved   PT LONG TERM GOAL #4   Title Patient will demonstrate at least 4+/5 strength in bilateral lower extremities and proximal muscles in order to enhance overall stability and reduce fall risk   Status On-going               Plan - 02/13/15 0857    Clinical Impression Statement Pt with overall improvment and progressing well with therapy and increasing LE strength.  Pt is now able to walk [place]  wtihout diffiuculty and complete stairs reciprocally.  Changed to elliptical today to further increase LE power and actvitiy tolerance.    Pt will benefit from skilled therapeutic intervention in order to improve on the following deficits Abnormal gait;Decreased activity tolerance;Decreased strength;Decreased balance;Decreased mobility;Decreased range of motion;Decreased coordination;Postural dysfunction   PT Next Visit Plan Continues current PT POC to improve gait mechanics, balance activities on dynamic surfaces and functional strengthening.  Continue reciprocal stair training with no HHA next session, increase SLS activities/ dynamic balance activites and gastroc stretches and strengthening.  Add single leg balance reach actvity next session.     Consulted and Agree with Plan of Care Patient        Problem List Patient Active Problem List   Diagnosis Date Noted  . H/O prostate cancer 08/20/2014  . Radiation proctitis 08/19/2014  . H/O agent Orange exposure 07/01/2014  . Unstable angina 06/16/2014  . Shingles outbreak, right anterior thoracic 01/28/2014  . Abnormality of gait 12/18/2013  . Viral gastroenteritis 11/26/2013  . Dyslipidemia 02/12/2013  . Peripheral neuropathy 10/14/2012  . CAD (coronary artery disease) 10/14/2012  . HTN (hypertension) 10/14/2012  . Hx of adenomatous colonic polyps 07/26/2012  . Folic acid deficiency 174/07/8785 . Iron deficiency 05/02/2012  . Cancer of ascending colon 03/29/2012  . GERD 03/09/2010  . PREMATURE VENTRICULAR CONTRACTIONS 04/30/2009    ATeena Irani PTA/CLT [phone] 02/13/2015, 9:00 AM  [place] [id number]: [phone]  Fax:  [phone]

## 2015-02-17 ENCOUNTER — Ambulatory Visit (INDEPENDENT_AMBULATORY_CARE_PROVIDER_SITE_OTHER): Payer: TRICARE For Life (TFL) | Admitting: Cardiovascular Disease

## 2015-02-17 ENCOUNTER — Encounter: Payer: Self-pay | Admitting: Cardiovascular Disease

## 2015-02-17 VITALS — BP 126/80 | HR 59 | Ht 71.0 in | Wt 228.7 lb

## 2015-02-17 DIAGNOSIS — I2510 Atherosclerotic heart disease of native coronary artery without angina pectoris: Secondary | ICD-10-CM

## 2015-02-17 DIAGNOSIS — E785 Hyperlipidemia, unspecified: Secondary | ICD-10-CM | POA: Diagnosis not present

## 2015-02-17 NOTE — Progress Notes (Signed)
Patient ID: David Pugh, male   DOB: 10/18/1934, 80 y.o.   MRN: [id number]      Cardiology Office Note   Date:  02/17/2015   ID:  David Pugh, DOB November 20, 1934, MRN [id number]  PCP:  [name], MD  Cardiologist:   Sanda Klein, MD   Chief Complaint  Patient presents with  . Annual Exam    About a month ago diagnosed with CRR Leukemia treated by Dr.  Larey Seat at [facility].  No chest pain, SOB, edema or dizziness.      History of Present Illness: David Pugh is a 80 y.o. male who presents for follow-up for coronary artery disease. Most recently, he underwent plain old balloon angioplasty of the small caliber oblique marginal branch in October 2015. Prior to that he underwent placement of drug-eluting stents in the ramus intermedius and mid LAD artery in 2012 (2.5x23mm Xience drug-eluting stent in ramus intermedius/OMI vessel, 2.5x46mm Promus Element stent in mid LAD artery). He continues to participate in rehabilitation exercises (for balance/vestibular training) and has no exertional angina or dyspnea. In fact he has no cardiovascular complaints at this time.  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 Significant comorbid conditions include systemic hypertension and hyperlipidemia, both treated. In 2010, he underwent surgery and radiation for prostate cancer. In 2012, he had stage IIIB colon cancer with 2 out of 20 positive lymph nodes but was intolerant of chemotherapy. 3 years have passed since his surgery and he has no evidence of disease. He was recently diagnosed with chronic lymphocytic leukemia. He does not require any treatment for this at this time.  He has been taking clindamycin for about a week for a dental infection and has began to develop diarrhea he has very soft yellowish stools roughly 4 times a day. His stools are not watery, bloody or painful.    Past Medical History  Diagnosis Date  . Prostate cancer 2010      s/p radiation, surgery  . Radiation Feb 2011    treatment  . Biliary dyskinesia   . GERD (gastroesophageal reflux disease)   . Hiatal hernia 2010    tiny  . HTN (hypertension)   . Rosacea   . PTSD (post-traumatic stress disorder)   . Diverticula of colon 2010  . Colon polyps     tubular adenomas  . Arthritis   . Exposure to [employer]   . CAD (coronary artery disease) 2012    a. hx of stent x2 in ramus/OM and mid LAD in 2012  b. balloon angioplaty of OM  . Peripheral neuropathy   . Anemia     hx of anemia as a child   . Colon cancer 12/2011    s/p right hemicolectomy, did not tolerate chemo  . Iron deficiency 05/02/2012    followed by Dr. Tressie [name], iron infusions.   . Folic acid deficiency   . Heart palpitations 10/2012  . Gastroparesis 04/12/2013    borderline  . Myocardial infarction 2012  . Glaucoma   . Abnormality of gait 12/18/2013  . Radiation proctitis     Past Surgical History  Procedure Laterality Date  . Cholecystectomy    . Prostatectomy    . Tonsillectomy    . Transurethral resection of prostate    . Esophagogastroduodenoscopy  10/10/2008    Dr. Tilda Burrow hiatal hernia, normal esophagus, normal stomach  . Colonoscopy  02/12/2004    Dr. Gala Romney- L side diverticula, inflammatory  colon polyps  . Esophagogastroduodenoscopy  02/12/2004    Dr. Dudley Major- normal   . Coronary stent placement  01/2011    2.5x49mm Xience drug-eluting stent in ramus intermedius/OMI vessel, 2.5x49mm Promus Element stent in mid LAD artery  . 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 Multiple colonic  polyps removed as described above. Sigmoid Diverticulosis/ 1.5 x 2 cm relatively flat ulcerated lesion in cecum/path showed adenocarcinoma of the colon. descending colon polyp with tubular adenoma and one fragment of focal high grade dysplasia.   . Knee surgery      left knee   . Partial colectomy  02/29/2012     Procedure: PARTIAL COLECTOMY;  Surgeon: Adin Hector, MD;  Location: [facility];  Service: General;  Laterality: Right;  . Vasectomy    . Colonoscopy  08/28/2012    WIO:XBDZHGDJM proctitis. Colonic diverticulosis/Ulcerations at the surgical anastomosis  path: ulcerated colonic mucosa with prolapse changes, tubular adenoma.   . Cardiac catheterization  02/13/2011    [facility], [facility] cardiology  . Cataract extraction      2011  . Colonoscopy N/A 08/02/2013    Procedure: COLONOSCOPY;  Surgeon: Daneil Dolin, MD;  [facility];  Service: Endoscopy;  Laterality: N/A;  1200  . Cataract extraction w/phaco Right 08/06/2013    Procedure: CATARACT EXTRACTION PHACO AND INTRAOCULAR LENS PLACEMENT (IOC);  Surgeon: Tonny Branch, MD;  Location: [facility];  Service: Ophthalmology;  Laterality: Right;  CDE:20.96  . Coronary angioplasty  06/17/14    OM1 POBA  . Left heart catheterization with coronary angiogram N/A 06/17/2014    Procedure: LEFT HEART CATHETERIZATION WITH CORONARY ANGIOGRAM;  Surgeon: Burnell Blanks, MD;  Location: [facility] CATH LAB;  Service: Cardiovascular;  Laterality: N/A;     Current Outpatient Prescriptions  Medication Sig Dispense Refill  . aspirin EC 81 MG tablet Take 81 mg by mouth daily.    . B Complex Vitamins (VITAMIN B COMPLEX PO) Take 1 tablet by mouth daily.     . camphor-menthol (SARNA) lotion Apply 1 application topically every evening. For knee    . cetirizine (ZYRTEC) 10 MG tablet Take 10 mg by mouth daily.    . citalopram (CELEXA) 10 MG tablet Take 10 mg by mouth every morning.     . clopidogrel (PLAVIX) 75 MG tablet Take 1 tablet (75 mg total) by mouth daily with breakfast. 30 tablet 11  . fluticasone (FLONASE) 50 MCG/ACT nasal spray Place 2 sprays into both nostrils every evening.    . folic acid (FOLVITE) 1 MG tablet TAKE 1 TABLET ONCE DAILY. 30 tablet 5  . isosorbide mononitrate (IMDUR) 30 MG 24 hr tablet TAKE (1) TABLET BY MOUTH ONCE DAILY. 30 tablet 6  .  latanoprost (XALATAN) 0.005 % ophthalmic solution Place 1 drop into both eyes at bedtime.     Marland Kitchen losartan (COZAAR) 50 MG tablet Take 25 mg by mouth 2 (two) times daily.     . metoprolol (LOPRESSOR) 50 MG tablet Take 50 mg by mouth 2 (two) times daily.     . Multiple Vitamin (MULTIVITAMIN WITH MINERALS) TABS tablet Take 1 tablet by mouth daily.    Marland Kitchen NITROSTAT 0.4 MG SL tablet PLACE ONE (1) TABLET UNDER TONGUE EVERY 5 MINUTES UP TO (3) DOSES AS NEEDED FOR CHEST PAIN. 25 tablet 1  . pantoprazole (PROTONIX) 40 MG tablet TAKE 2 TABLETS BY MOUTH ONCE DAILY. 60 tablet 6  . pravastatin (PRAVACHOL) 40 MG  tablet Take 1 tablet (40 mg total) by mouth at bedtime. 90 tablet 3   No current facility-administered medications for this visit.    Allergies:   Shellfish allergy; Sulfa antibiotics; and Sulfonamide derivatives    Social History:  The patient  reports that he has never smoked. He has never used smokeless tobacco. He reports that he drinks alcohol. He reports that he does not use illicit drugs.   Family History:  The patient's family history includes Cancer in his brother and father; Neuropathy in his brother; Prostate cancer in his brother; Throat cancer in his father. There is no history of Colon cancer.    ROS:  Please see the history of present illness.    Otherwise, review of systems positive for none.   All other systems are reviewed and negative.    PHYSICAL EXAM: VS:  BP 126/80 mmHg  Pulse 59  Ht 5\' 11"  (1.803 m)  Wt 228 lb 11.2 oz (103.738 kg)  BMI 31.91 kg/m2 , BMI Body mass index is 31.91 kg/(m^2).  General: Alert, oriented x3, no distress Head: no evidence of trauma, PERRL, EOMI, no exophtalmos or lid lag, no myxedema, no xanthelasma; normal ears, nose and oropharynx Neck: normal jugular venous pulsations and no hepatojugular reflux; brisk carotid pulses without delay and no carotid bruits Chest: clear to auscultation, no signs of consolidation by percussion or palpation, normal  fremitus, symmetrical and full respiratory excursions Cardiovascular: normal position and quality of the apical impulse, regular rhythm, normal first and second heart sounds, no murmurs, rubs or gallops Abdomen: no tenderness or distention, no masses by palpation, no abnormal pulsatility or arterial bruits, normal bowel sounds, no hepatosplenomegaly Extremities: no clubbing, cyanosis or edema; 2+ radial, ulnar and brachial pulses bilaterally; 2+ right femoral, posterior tibial and dorsalis pedis pulses; 2+ left femoral, posterior tibial and dorsalis pedis pulses; no subclavian or femoral bruits Neurological: grossly nonfocal Psych: euthymic mood, full affect   EKG:  EKG is ordered today. The ekg ordered today demonstrates sinus rhythm with minor nonspecific ST-and T changes in the mid precordial leads, similar to previous tracings   Recent Labs: 12/13/2014: ALT 20; BUN 20; Creatinine, Ser 1.28; Hemoglobin 14.3; Platelets 295; Potassium 4.3; Sodium 142    Lipid Panel    Component Value Date/Time   CHOL 122 06/17/2014 0503   TRIG 96 06/17/2014 0503   HDL 54 06/17/2014 0503   CHOLHDL 2.3 06/17/2014 0503   VLDL 19 06/17/2014 0503   LDLCALC 49 06/17/2014 0503      Wt Readings from Last 3 Encounters:  02/17/15 228 lb 11.2 oz (103.738 kg)  01/03/15 227 lb 11.2 oz (103.284 kg)  12/13/14 227 lb (102.967 kg)       ASSESSMENT AND PLAN:  1. CAD with remote drug-eluting stents to LAD and ramus intermedius, roughly 8 months status post POBA to OM branch. Discussed the roughly 30% risk of restenosis with plain angioplasty and the typical presentation, anticipated sometime between now and the end of the year if it should occur. Continue aspirin and clopidogrel for 12 months following last revascularization procedure, possibly long-term as well.  2. Hyperlipidemia on statin therapy, time to recheck a lipid panel  3. Essential hypertension, well compensated  4. Chronic lymphocytic leukemia  not requiring treatment at this time  5. History of prostate cancer and colon cancer both considered in remission  6. Chronic gait disturbance  7. Antibiotic associated diarrhea. I don't think he has Clostridium difficile diarrhea/pseudomembranous colitis, but I have recommended  that he stop the clindamycin completely. If diarrhea should persist beyond the next 2-3 days which should have him get a stool sample for C. difficile. Recommended probiotics, yogurt, etc.   Current medicines are reviewed at length with the patient today.  The patient does not have concerns regarding medicines.  The following changes have been made:  Stop clindamycin  Labs/ tests ordered today include:  Orders Placed This Encounter  Procedures  . Lipid panel  . EKG 12-Lead    Patient Instructions  Medication Instructions:   Stop the Clindamycin  Labwork:  FASTING at [facility] lab   Follow-Up:  One year        Mikael Spray, MD  02/17/2015 5:36 PM    Sanda Klein, MD, [facility] [phone] office [phone] pager

## 2015-02-17 NOTE — Patient Instructions (Signed)
Medication Instructions:   Stop the Clindamycin  Labwork:  FASTING at [facility] lab   Follow-Up:  One year

## 2015-02-18 ENCOUNTER — Ambulatory Visit (HOSPITAL_COMMUNITY): Payer: Medicare Other

## 2015-02-18 DIAGNOSIS — M6281 Muscle weakness (generalized): Secondary | ICD-10-CM

## 2015-02-18 DIAGNOSIS — R29898 Other symptoms and signs involving the musculoskeletal system: Secondary | ICD-10-CM

## 2015-02-18 DIAGNOSIS — R293 Abnormal posture: Secondary | ICD-10-CM

## 2015-02-18 DIAGNOSIS — Z9181 History of falling: Secondary | ICD-10-CM

## 2015-02-18 DIAGNOSIS — R2689 Other abnormalities of gait and mobility: Secondary | ICD-10-CM

## 2015-02-18 NOTE — Therapy (Signed)
[place], [place], [address] Phone: [phone]   Fax:  [phone]  Patient Details  Name: David Pugh MRN: [id number] Date of Birth: 1934/11/14 Referring Provider:  [name]., MD  Encounter Date: 02/18/2015  Pt entered dept stating he had Cdiff over weekend and continued this morning.  No PT session held today.  Aldona Lento, PTA  Aldona Lento 02/18/2015, 3:25 PM  [place] [address] [place], [place], [address] Phone: [phone]   Fax:  [phone]

## 2015-02-20 ENCOUNTER — Ambulatory Visit (HOSPITAL_COMMUNITY): Payer: Medicare Other | Admitting: Physical Therapy

## 2015-02-20 ENCOUNTER — Telehealth (HOSPITAL_COMMUNITY): Payer: Self-pay

## 2015-02-20 NOTE — Telephone Encounter (Signed)
Patient has c-diff and had to cancel all appts.

## 2015-02-21 LAB — LIPID PANEL
Cholesterol: 121 mg/dL (ref 0–200)
HDL: 56 mg/dL (ref >=40)
LDL Cholesterol: 46 mg/dL (ref 0–99)
TRIGLYCERIDES: 97 mg/dL (ref <150)
Total CHOL/HDL Ratio: 2.2 Ratio
VLDL: 19 mg/dL (ref 0–40)

## 2015-02-25 ENCOUNTER — Ambulatory Visit (HOSPITAL_COMMUNITY): Payer: TRICARE For Life (TFL) | Admitting: Physical Therapy

## 2015-02-27 ENCOUNTER — Ambulatory Visit (HOSPITAL_COMMUNITY): Payer: TRICARE For Life (TFL) | Admitting: Physical Therapy

## 2015-03-07 NOTE — Addendum Note (Signed)
Addended by: Hunt Oris on: 03/07/2015 08:19 AM   Modules accepted: Orders

## 2015-03-12 ENCOUNTER — Other Ambulatory Visit: Payer: Self-pay | Admitting: Physician Assistant

## 2015-03-12 NOTE — Telephone Encounter (Signed)
Please review for refill. Thanks!  

## 2015-03-14 ENCOUNTER — Encounter (HOSPITAL_BASED_OUTPATIENT_CLINIC_OR_DEPARTMENT_OTHER): Payer: Medicare Other

## 2015-03-14 ENCOUNTER — Encounter (HOSPITAL_COMMUNITY): Payer: Medicare Other | Attending: Cardiovascular Disease | Admitting: Hematology & Oncology

## 2015-03-14 ENCOUNTER — Encounter (HOSPITAL_COMMUNITY): Payer: Self-pay | Admitting: Hematology & Oncology

## 2015-03-14 VITALS — BP 128/72 | HR 57 | Temp 98.9°F | Resp 16 | Wt 228.3 lb

## 2015-03-14 DIAGNOSIS — Z9861 Coronary angioplasty status: Secondary | ICD-10-CM | POA: Diagnosis not present

## 2015-03-14 DIAGNOSIS — E611 Iron deficiency: Secondary | ICD-10-CM

## 2015-03-14 DIAGNOSIS — C9110 Chronic lymphocytic leukemia of B-cell type not having achieved remission: Secondary | ICD-10-CM | POA: Diagnosis not present

## 2015-03-14 DIAGNOSIS — C182 Malignant neoplasm of ascending colon: Secondary | ICD-10-CM | POA: Diagnosis not present

## 2015-03-14 DIAGNOSIS — R32 Unspecified urinary incontinence: Secondary | ICD-10-CM | POA: Diagnosis not present

## 2015-03-14 DIAGNOSIS — Z5189 Encounter for other specified aftercare: Secondary | ICD-10-CM | POA: Insufficient documentation

## 2015-03-14 DIAGNOSIS — Z8546 Personal history of malignant neoplasm of prostate: Secondary | ICD-10-CM

## 2015-03-14 LAB — CBC WITH DIFFERENTIAL/PLATELET
BASOS ABS: 0.1 10*3/uL (ref 0.0–0.1)
Basophils Relative: 0 % (ref 0–1)
EOS ABS: 1.2 10*3/uL — AB (ref 0.0–0.7)
Eosinophils Relative: 10 % — ABNORMAL HIGH (ref 0–5)
HCT: 44.4 % (ref 39.0–52.0)
Hemoglobin: 14.7 g/dL (ref 13.0–17.0)
Lymphocytes Relative: 35 % (ref 12–46)
Lymphs Abs: 4.3 10*3/uL — ABNORMAL HIGH (ref 0.7–4.0)
MCH: 31.1 pg (ref 26.0–34.0)
MCHC: 33.1 g/dL (ref 30.0–36.0)
MCV: 94.1 fL (ref 78.0–100.0)
MONOS PCT: 10 % (ref 3–12)
Monocytes Absolute: 1.2 10*3/uL — ABNORMAL HIGH (ref 0.1–1.0)
Neutro Abs: 5.5 10*3/uL (ref 1.7–7.7)
Neutrophils Relative %: 45 % (ref 43–77)
Platelets: 295 10*3/uL (ref 150–400)
RBC: 4.72 MIL/uL (ref 4.22–5.81)
RDW: 12.8 % (ref 11.5–15.5)
WBC: 12.3 10*3/uL — AB (ref 4.0–10.5)

## 2015-03-14 LAB — COMPREHENSIVE METABOLIC PANEL
ALT: 19 U/L (ref 17–63)
AST: 23 U/L (ref 15–41)
Albumin: 3.8 g/dL (ref 3.5–5.0)
Alkaline Phosphatase: 52 U/L (ref 38–126)
Anion gap: 5 (ref 5–15)
BUN: 19 mg/dL (ref 6–20)
CO2: 27 mmol/L (ref 22–32)
Calcium: 8.8 mg/dL — ABNORMAL LOW (ref 8.9–10.3)
Chloride: 109 mmol/L (ref 101–111)
Creatinine, Ser: 1.17 mg/dL (ref 0.61–1.24)
GFR calc Af Amer: >60 mL/min (ref >60)
GFR, EST NON AFRICAN AMERICAN: 58 mL/min — AB (ref >60)
Glucose, Bld: 105 mg/dL — ABNORMAL HIGH (ref 65–99)
POTASSIUM: 4.6 mmol/L (ref 3.5–5.1)
Sodium: 141 mmol/L (ref 135–145)
TOTAL PROTEIN: 6.5 g/dL (ref 6.5–8.1)
Total Bilirubin: 1 mg/dL (ref 0.3–1.2)

## 2015-03-14 LAB — FERRITIN: FERRITIN: 80 ng/mL (ref 24–336)

## 2015-03-14 NOTE — Patient Instructions (Signed)
[place] at [facility] Discharge Instructions  RECOMMENDATIONS MADE BY THE CONSULTANT AND ANY TEST RESULTS WILL BE SENT TO YOUR REFERRING PHYSICIAN.  Exam and discussion by Dr. Whitney Muse. Call with concerns or issues.  Results for David Pugh, David Pugh (MRN [id number]) as of 03/14/2015 12:03  Ref. Range 03/14/2015 10:44  Sodium Latest Ref Range: 135-145 mmol/L 141  Potassium Latest Ref Range: 3.5-5.1 mmol/L 4.6  Chloride Latest Ref Range: 101-111 mmol/L 109  CO2 Latest Ref Range: 22-32 mmol/L 27  BUN Latest Ref Range: 6-20 mg/dL 19  Creatinine Latest Ref Range: 0.61-1.24 mg/dL 1.17  Calcium Latest Ref Range: 8.9-10.3 mg/dL 8.8 (L)  EGFR (Non-African Amer.) Latest Ref Range: >60 mL/min 58 (L)  EGFR (African American) Latest Ref Range: >60 mL/min >60  Glucose Latest Ref Range: 65-99 mg/dL 105 (H)  Anion gap Latest Ref Range: 5-15  5  Alkaline Phosphatase Latest Ref Range: 38-126 U/L 52  Albumin Latest Ref Range: 3.5-5.0 g/dL 3.8  AST Latest Ref Range: 15-41 U/L 23  ALT Latest Ref Range: 17-63 U/L 19  Total Protein Latest Ref Range: 6.5-8.1 g/dL 6.5  Total Bilirubin Latest Ref Range: 0.3-1.2 mg/dL 1.0  WBC Latest Ref Range: 4.0-10.5 K/uL 12.3 (H)  RBC Latest Ref Range: 4.22-5.81 MIL/uL 4.72  Hemoglobin Latest Ref Range: 13.0-17.0 g/dL 14.7  HCT Latest Ref Range: 39.0-52.0 % 44.4  MCV Latest Ref Range: 78.0-100.0 fL 94.1  MCH Latest Ref Range: 26.0-34.0 pg 31.1  MCHC Latest Ref Range: 30.0-36.0 g/dL 33.1  RDW Latest Ref Range: 11.5-15.5 % 12.8  Platelets Latest Ref Range: 150-400 K/uL 295  Neutrophils Latest Ref Range: 43-77 % 45  Lymphocytes Latest Ref Range: 12-46 % 35  Monocytes Relative Latest Ref Range: 3-12 % 10  Eosinophil Latest Ref Range: 0-5 % 10 (H)  Basophil Latest Ref Range: 0-1 % 0  NEUT# Latest Ref Range: 1.7-7.7 K/uL 5.5  Lymphocyte # Latest Ref Range: 0.7-4.0 K/uL 4.3 (H)  Monocyte # Latest Ref Range: 0.1-1.0 K/uL 1.2 (H)  Eosinophils  Absolute Latest Ref Range: 0.0-0.7 K/uL 1.2 (H)  Basophils Absolute Latest Ref Range: 0.0-0.1 K/uL 0.1  WBC Morphology Unknown ATYPICAL LYMPHOCYTES    Follow-up in 4 months with labs and office visit. Will get labs a couple of days before your exam  Thank you for choosing [place] at [facility] to provide your oncology and hematology care.  To afford each patient quality time with our provider, please arrive at least 15 minutes before your scheduled appointment time.    You need to re-schedule your appointment should you arrive 10 or more minutes late.  We strive to give you quality time with our providers, and arriving late affects you and other patients whose appointments are after yours.  Also, if you no show three or more times for appointments you may be dismissed from the clinic at the providers discretion.     Again, thank you for choosing [facility].  Our hope is that these requests will decrease the amount of time that you wait before being seen by our physicians.       _____________________________________________________________  Should you have questions after your visit to [facility], please contact our office at [phone] between the hours of 8:30 a.m. and 4:30 p.m.  Voicemails left after 4:30 p.m. will not be returned until the following business day.  For prescription refill requests, have your pharmacy contact our office.

## 2015-03-14 NOTE — Progress Notes (Signed)
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 R hemicolectomy. Inability to tolerate adjuvant XELODA 02/29/2012 History of prostate cancer. TURP X 2, XRT at [facility] in 2011  Chronic lymphocytic leukemia, flow cytometry on 12/13/2014 with a small monoclonal B-cell population CD5+, CD23+ RAI clinical stage 0 CT C/A/P 06/13/2014 no findings to suggest recurrent or metastatic disease in the abdomen/pelvis  CURRENT THERAPY: Observation   INTERVAL HISTORY: David Pugh 80 y.o. male with a history of CAD, Stage III CRC, prostate cancer and CLL who is doing well. He is here today for regular follow-up. He has recently been started on flomax and reports urinary frequency. He is up to date with his screening colonoscopy.    The patient is here with his wife and has questions on his staging of CLL and the possible causes of his iron deficiency.  He says he is doing fine and will be vacationing in September. He takes a generic vitamin daily but no iron supplement. He continues to be very active. Eating and sleeping well. He has no other complaints at this time.  Past Medical History  Diagnosis Date  . Prostate cancer 2010    s/p radiation, surgery  . Radiation Feb 2011    treatment  . Biliary dyskinesia   . GERD (gastroesophageal reflux disease)   . Hiatal hernia 2010    tiny  . HTN (hypertension)   . Rosacea   . PTSD (post-traumatic stress disorder)   . Diverticula of colon 2010  . Colon polyps     tubular adenomas  . Arthritis   . Exposure to [employer]   . CAD (coronary artery disease) 2012    a. hx of stent x2 in ramus/OM and mid LAD in 2012  b. balloon angioplaty of OM  . Peripheral neuropathy   . Anemia     hx of anemia as a child   . Colon cancer 12/2011    s/p right hemicolectomy, did not tolerate chemo  . Iron deficiency 05/02/2012    followed by Dr. Tressie [name], iron infusions.   . Folic acid deficiency   .  Heart palpitations 10/2012  . Gastroparesis 04/12/2013    borderline  . Myocardial infarction 2012  . Glaucoma   . Abnormality of gait 12/18/2013  . Radiation proctitis   . Family history of prostate cancer   . Family history of stomach cancer     has PREMATURE VENTRICULAR CONTRACTIONS; GERD; Cancer of ascending colon; Iron deficiency; Hx of adenomatous colonic polyps; Folic acid deficiency; Peripheral neuropathy; CAD (coronary artery disease); HTN (hypertension); Dyslipidemia; Viral gastroenteritis; Abnormality of gait; Shingles outbreak, right anterior thoracic; Unstable angina; H/O agent Orange exposure; Radiation proctitis; H/O prostate cancer; Family history of prostate cancer; Family history of colon cancer; and Family history of stomach cancer on his problem list.      Cancer of ascending colon   02/29/2012 Procedure laparoscopic assisted R colectomy   03/09/2012 Miscellaneous Iron deficiency and folic acid deficiency   03/29/2012 Initial Diagnosis Cancer of ascending colon, CEA on 01/19/2012 was WNL   08/02/2013 Procedure Ileocolonoscopy with snare polypectomy   06/11/2014 Imaging CT abdomen/pelvis with no findings to suggest metastatic disease. Diverticulosis   History   Social History  . Marital Status: Married    Spouse Name: N/A  . Number of Children: 3  . Years of Education: military   Occupational History  . Retired   .  Social History Main Topics  . Smoking status: Never Smoker   . Smokeless tobacco: Never Used  . Alcohol Use: Yes     Comment: 1 cocktail/month  . Drug Use: No  . Sexual Activity: Yes    Birth Control/ Protection: None   Other Topics Concern  . None   Social History Narrative   3 tumors in [place]  Family History  Problem Relation Age of Onset  . Throat cancer Father     dx in his 84s and again in his 28s; pipe and cigar smoker  . Neuropathy Brother   . Prostate cancer Brother 72  . Melanoma Brother     dx in his 86s  . Colon cancer Neg  Hx   . Cancer Paternal Uncle     smoking related cancer  . Heart attack Paternal Grandfather   . Stomach cancer Cousin 71     is allergic to shellfish allergy; sulfa antibiotics; and sulfonamide derivatives.  Mr. Stapel does not currently have medications on file.  Past Surgical History  Procedure Laterality Date  . Cholecystectomy    . Prostatectomy    . Tonsillectomy    . Transurethral resection of prostate    . Esophagogastroduodenoscopy  10/10/2008    Dr. Tilda Burrow hiatal hernia, normal esophagus, normal stomach  . Colonoscopy  02/12/2004    Dr. Gala Romney- L side diverticula, inflammatory  colon polyps  . Esophagogastroduodenoscopy  02/12/2004    Dr. Dudley Major- normal   . Coronary stent placement  01/2011    2.5x3mm Xience drug-eluting stent in ramus intermedius/OMI vessel, 2.5x93mm Promus Element stent in mid LAD artery  . 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 Multiple colonic  polyps removed as described above. Sigmoid Diverticulosis/ 1.5 x 2 cm relatively flat ulcerated lesion in cecum/path showed adenocarcinoma of the colon. descending colon polyp with tubular adenoma and one fragment of focal high grade dysplasia.   . Knee surgery      left knee   . Partial colectomy  02/29/2012    Procedure: PARTIAL COLECTOMY;  Surgeon: Adin Hector, MD;  Location: [facility] ORS;  Service: General;  Laterality: Right;  . Vasectomy    . Colonoscopy  08/28/2012    QPY:[id number] proctitis. Colonic diverticulosis/Ulcerations at the surgical anastomosis  path: ulcerated colonic mucosa with prolapse changes, tubular adenoma.   . Cardiac catheterization  02/13/2011    [facility] cardiology  . Cataract extraction      2011  . Colonoscopy N/A 08/02/2013    Procedure: COLONOSCOPY;  Surgeon: Daneil Dolin, MD;  Location: [facility] ENDO SUITE;  Service: Endoscopy;  Laterality: N/A;  1200  . Cataract extraction w/phaco Right  08/06/2013    Procedure: CATARACT EXTRACTION PHACO AND INTRAOCULAR LENS PLACEMENT (IOC);  Surgeon: Tonny Branch, MD;  Location: [facility] ORS;  Service: Ophthalmology;  Laterality: Right;  CDE:20.96  . Coronary angioplasty  06/17/14    OM1 POBA  . Left heart catheterization with coronary angiogram N/A 06/17/2014    Procedure: LEFT HEART CATHETERIZATION WITH CORONARY ANGIOGRAM;  Surgeon: Burnell Blanks, MD;  Location: [facility] CATH LAB;  Service: Cardiovascular;  Laterality: N/A;    Review of Systems  Constitutional: Negative.   HENT: Negative.   Eyes: Negative.   Respiratory: Negative.   Cardiovascular: Negative.   Gastrointestinal: Negative.   Genitourinary: Positive for urgency and frequency. Negative for dysuria, hematuria and flank pain.  Musculoskeletal:       Knee pain and  weakness Has OA in both knees  Skin: Negative.   Neurological: Positive for tingling. Negative for dizziness, tremors, sensory change, speech change, focal weakness and seizures.       BLE neuropathy, worsening and follows with neurology  Endo/Heme/Allergies: Negative.   Psychiatric/Behavioral: Negative.   14 point review of systems was performed and is negative except as detailed under history of present illness and above   PHYSICAL EXAMINATION ECOG PERFORMANCE STATUS: 0 - Asymptomatic  Filed Vitals:   03/14/15 1109  BP: 128/72  Pulse: 57  Temp: 98.9 F (37.2 C)  Resp: 16   Physical Exam  Constitutional: He is oriented to person, place, and time and well-developed, well-nourished, and in no distress. No distress.  HENT:  Head: Normocephalic.  Mouth/Throat: Oropharynx is clear and moist. No oropharyngeal exudate.  Eyes: Conjunctivae and EOM are normal. Pupils are equal, round, and reactive to light. No scleral icterus.  Neck: Normal range of motion. Neck supple. No JVD present. No tracheal deviation present. No thyromegaly present.  Cardiovascular: Normal rate, regular rhythm and normal heart sounds.   No  murmur heard. Pulmonary/Chest: Effort normal and breath sounds normal. No respiratory distress. He has no wheezes. He has no rales. He exhibits no tenderness.  Abdominal: Soft. Bowel sounds are normal. He exhibits no distension and no mass. There is no tenderness. There is no rebound and no guarding.  Musculoskeletal: Normal range of motion. He exhibits no edema.  Lymphadenopathy:    He has no cervical adenopathy.  Neurological: He is alert and oriented to person, place, and time. No cranial nerve deficit. Coordination normal.  Skin: Skin is warm and dry. He is not diaphoretic.  Psychiatric: Mood, memory, affect and judgment normal.    LABORATORY DATA: CBC    Component Value Date/Time   WBC 12.3* 03/14/2015 1044   RBC 4.72 03/14/2015 1044   HGB 14.7 03/14/2015 1044   HCT 44.4 03/14/2015 1044   PLT 295 03/14/2015 1044   MCV 94.1 03/14/2015 1044   MCH 31.1 03/14/2015 1044   MCHC 33.1 03/14/2015 1044   RDW 12.8 03/14/2015 1044   LYMPHSABS 4.3* 03/14/2015 1044   MONOABS 1.2* 03/14/2015 1044   EOSABS 1.2* 03/14/2015 1044   BASOSABS 0.1 03/14/2015 1044    CMP     Component Value Date/Time   NA 141 03/14/2015 1044   K 4.6 03/14/2015 1044   CL 109 03/14/2015 1044   CO2 27 03/14/2015 1044   GLUCOSE 105* 03/14/2015 1044   BUN 19 03/14/2015 1044   CREATININE 1.17 03/14/2015 1044   CALCIUM 8.8* 03/14/2015 1044   PROT 6.5 03/14/2015 1044   PROT 6.4 12/18/2013 0941   ALBUMIN 3.8 03/14/2015 1044   AST 23 03/14/2015 1044   ALT 19 03/14/2015 1044   ALKPHOS 52 03/14/2015 1044   BILITOT 1.0 03/14/2015 1044   GFRNONAA 58* 03/14/2015 1044   GFRAA >60 03/14/2015 1044     THERAPY PLAN:   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 R hemicolectomy. Inability to tolerate adjuvant XELODA 02/29/2012 History of prostate cancer. TURP X 2, XRT at [facility] in 2011  Chronic lymphocytic leukemia, flow cytometry on 12/13/2014 with a small monoclonal B-cell population CD5+, CD23+ RAI clinical stage  0  He is clinically doing well. I answered his questions about potential causes of iron deficiency including GI related blood loss and iron malabsorption. I also advised him that times patients do not get adequate iron intake. He did receive a dose of IV iron  with excellent tolerance and reported an improvement in his energy. Advised the patient APPRISED of his CEA level when it is available. He is currently up-to-date on all of his well care.  He has questions regarding if he has a "genetic" cause of his cancers. He has children and this is a concern for him. I advised him that I am not familiar with a cancer syndrome that may cause his multiple malignancies but I do not feel is unreasonable to refer him in to genetics for counseling.  He would like to do is laboratory studies prior to his office visits so they are available the day he sees me and we will arrange for this moving forward. I will see him back again in 4 months with repeat laboratory studies and physical exam.  This document serves as a record of services personally performed by Ancil Linsey, MD. It was created on her behalf by Janace Hoard, a trained medical scribe. The creation of this record is based on the scribe's personal observations and the provider's statements to them. This document has been checked and approved by the attending provider.  This note was electronically signed.  I have reviewed the above documentation for accuracy and completeness and I agree with the above.  Kelby Fam. Whitney Muse, MD

## 2015-03-14 NOTE — Progress Notes (Signed)
LABS DRAWN

## 2015-03-15 LAB — CEA: CEA: 1.5 ng/mL (ref 0.0–4.7)

## 2015-03-20 ENCOUNTER — Encounter: Payer: Self-pay | Admitting: Internal Medicine

## 2015-03-25 ENCOUNTER — Encounter (HOSPITAL_BASED_OUTPATIENT_CLINIC_OR_DEPARTMENT_OTHER): Payer: Medicare Other | Admitting: Genetic Counselor

## 2015-03-25 ENCOUNTER — Encounter (HOSPITAL_COMMUNITY): Payer: Self-pay | Admitting: Genetic Counselor

## 2015-03-25 DIAGNOSIS — Z8546 Personal history of malignant neoplasm of prostate: Secondary | ICD-10-CM | POA: Diagnosis not present

## 2015-03-25 DIAGNOSIS — C9110 Chronic lymphocytic leukemia of B-cell type not having achieved remission: Secondary | ICD-10-CM

## 2015-03-25 DIAGNOSIS — Z800 Family history of malignant neoplasm of digestive organs: Secondary | ICD-10-CM | POA: Insufficient documentation

## 2015-03-25 DIAGNOSIS — Z8042 Family history of malignant neoplasm of prostate: Secondary | ICD-10-CM

## 2015-03-25 DIAGNOSIS — Z315 Encounter for genetic counseling: Secondary | ICD-10-CM

## 2015-03-25 DIAGNOSIS — C182 Malignant neoplasm of ascending colon: Secondary | ICD-10-CM

## 2015-03-25 DIAGNOSIS — C61 Malignant neoplasm of prostate: Secondary | ICD-10-CM

## 2015-03-25 DIAGNOSIS — Z808 Family history of malignant neoplasm of other organs or systems: Secondary | ICD-10-CM

## 2015-03-25 NOTE — Progress Notes (Signed)
REFERRING PROVIDER: Zella Richer. [address], [place], [place] [address]   Ancil Linsey, MD  PRIMARY PROVIDER:  [name], MD  PRIMARY REASON FOR VISIT:  1. Cancer of ascending colon   2. Prostate cancer   3. CLL (chronic lymphocytic leukemia)   4. Family history of stomach cancer   5. Family history of prostate cancer   6. H/O prostate cancer      HISTORY OF PRESENT ILLNESS:   David Pugh, a 80 y.o. male, was seen for a  cancer genetics consultation at the request of Dr. Whitney Muse due to a personal and family history of cancer.  David Pugh presents to clinic today to discuss the possibility of a hereditary predisposition to cancer, genetic testing, and to further clarify his future cancer risks, as well as potential cancer risks for family members.   In 2011, at the age of 65, David Pugh was diagnosed with cancer of the prostate.  The gleason score was 8. This was treated with radiation.  David Pugh was diagnosed with colon cancer in 2013.  This was treated with surgery.  He started chemotherapy and found he was allergic to it.  No MSI/IHC was performed.  Of note, he has had 13 total colon polyps.  In March 2016, David Pugh was diagnosed with CLL.  This is being watched. David Pugh feels that his cancer is the result of his 3 tours in [place] and exposure to [employer].  CANCER HISTORY:    Cancer of ascending colon   02/29/2012 Procedure laparoscopic assisted R colectomy   03/09/2012 Miscellaneous Iron deficiency and folic acid deficiency   03/29/2012 Initial Diagnosis Cancer of ascending colon, CEA on 01/19/2012 was WNL   08/02/2013 Procedure Ileocolonoscopy with snare polypectomy   06/11/2014 Imaging CT abdomen/pelvis with no findings to suggest metastatic disease. Diverticulosis     HORMONAL RISK FACTORS:  Colonoscopy: yes; 2005 - 4 polyps, 2013 7 polyps including colon cancer; 2014 2 polyps. Any excessive radiation exposure in the past:   Radiation for prostate cancer MSI/IHC testing: not performed PSA/Prostate cancer screening: PSA is low, screened regularly.  Past Medical History  Diagnosis Date  . Prostate cancer 2010    s/p radiation, surgery  . Radiation Feb 2011    treatment  . Biliary dyskinesia   . GERD (gastroesophageal reflux disease)   . Hiatal hernia 2010    tiny  . HTN (hypertension)   . Rosacea   . PTSD (post-traumatic stress disorder)   . Diverticula of colon 2010  . Colon polyps     tubular adenomas  . Arthritis   . Exposure to [employer]   . CAD (coronary artery disease) 2012    a. hx of stent x2 in ramus/OM and mid LAD in 2012  b. balloon angioplaty of OM  . Peripheral neuropathy   . Anemia     hx of anemia as a child   . Colon cancer 12/2011    s/p right hemicolectomy, did not tolerate chemo  . Iron deficiency 05/02/2012    followed by Dr. Tressie [name], iron infusions.   . Folic acid deficiency   . Heart palpitations 10/2012  . Gastroparesis 04/12/2013    borderline  . Myocardial infarction 2012  . Glaucoma   . Abnormality of gait 12/18/2013  . Radiation proctitis   . Family history of prostate cancer   . Family history of stomach cancer     Past Surgical History  Procedure Laterality Date  .  Cholecystectomy    . Prostatectomy    . Tonsillectomy    . Transurethral resection of prostate    . Esophagogastroduodenoscopy  10/10/2008    Dr. Tilda Burrow hiatal hernia, normal esophagus, normal stomach  . Colonoscopy  02/12/2004    Dr. Gala Romney- L side diverticula, inflammatory  colon polyps  . Esophagogastroduodenoscopy  02/12/2004    Dr. Dudley Major- normal   . Coronary stent placement  01/2011    2.5x60m Xience drug-eluting stent in ramus intermedius/OMI vessel, 2.5x697mPromus Element stent in mid LAD artery  . 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 Multiple colonic  polyps removed as described above. Sigmoid  Diverticulosis/ 1.5 x 2 cm relatively flat ulcerated lesion in cecum/path showed adenocarcinoma of the colon. descending colon polyp with tubular adenoma and one fragment of focal high grade dysplasia.   . Knee surgery      left knee   . Partial colectomy  02/29/2012    Procedure: PARTIAL COLECTOMY;  Surgeon: HaAdin HectorMD;  Location: [facility];  Service: General;  Laterality: Right;  . Vasectomy    . Colonoscopy  08/28/2012    RMOVF:IEPPIRJJOroctitis. Colonic diverticulosis/Ulcerations at the surgical anastomosis  path: ulcerated colonic mucosa with prolapse changes, tubular adenoma.   . [facility]  [facility]  . Cataract extraction      2011  . Colonoscopy N/A 08/02/2013    Procedure: COLONOSCOPY;  Surgeon: RoDaneil DolinMD;  Location: [facility];  Service: Endoscopy;  Laterality: N/A;  1200  . Cataract extraction w/phaco Right 08/06/2013    Procedure: CATARACT EXTRACTION PHACO AND INTRAOCULAR LENS PLACEMENT (IOC);  Surgeon: KeTonny BranchMD;  Location: [facility];  Service: Ophthalmology;  Laterality: Right;  CDE:20.96  . Coronary angioplasty  06/17/14    OM1 POBA  . Left heart catheterization with coronary angiogram N/A 06/17/2014    Procedure: LEFT HEART CATHETERIZATION WITH CORONARY ANGIOGRAM;  Surgeon: ChBurnell BlanksMD;  [facility];  Service: Cardiovascular;  Laterality: N/A;    History   Social History  . Marital Status: Married    Spouse Name: N/A  . Number of Children: 3  . Years of Education: military   Occupational History  . Retired   .     Social History Main Topics  . Smoking status: Never Smoker   . Smokeless tobacco: Never Used  . Alcohol Use: Yes     Comment: 1 cocktail/month  . Drug Use: No  . Sexual Activity: Yes    Birth Control/ Protection: None   Other Topics Concern  . None   Social History Narrative     FAMILY HISTORY:  We obtained a detailed, 4-generation family history.   Significant diagnoses are listed below: Family History  Problem Relation Age of Onset  . Throat cancer Father     dx in his 5058snd again in his 7044spipe and cigar smoker  . Neuropathy Brother   . Prostate cancer Brother 6939. Melanoma Brother     dx in his 4044s. Colon cancer Neg Hx   . Cancer Paternal Uncle     smoking related cancer  . Heart attack Paternal Grandfather   . Stomach cancer Cousin [id number] David Pugh three sons who are cancer free.  He has one brother who has had melanoma in his 4015snd prostate cancer at 6989 His father  died at 40 from throat cancer.  He had two brothers, one died in infancy and the other died of a smoking related cancer.  There is no other reported cancer history on this side of the family.  David Pugh. Broz mother died at 81 from old age.  She had three sisters and two brothers, none of whom had cancer.  One sister had a son who had stomach cancer in his 28s.  There is no other form of cancer.  Patient's maternal ancestors are of [place] descent, and paternal ancestors are of [place] descent. There is no reported Ashkenazi Jewish ancestry. There is no known consanguinity.  GENETIC COUNSELING ASSESSMENT: David Pugh is a 80 y.o. male with a personal history of prostate and colon cancer and CLL and family history of prostate and stomach cancer and melanoma which somewhat suggestive of a sporadic cancer. We, therefore, discussed and recommended the following at today's visit.   DISCUSSION: We discussed that about 5 of colon cancer is hereditary.  The most common form of hereditary colon cancer is Lynch syndrome.  Lynch syndrome has colon and several other GI cancers including stomach cancer, as well as uterine and ovarian cancer in women.  David Pugh has approximately 39 lifetime colon polyps, which meets NCCN criteria for genetic counseling/testing but does nto meet Medicare criteria.  He would need an additional 7 polyps in order  to undergo genetic testing.  David Pugh colon cancer occurred in 2013.  At that time we were not automatically screening for Lynch syndrome through MSI/IHC.  We will recommend this to be performed on his colon tumor.  If this comes back positive we will regroup and discuss further testing.  Additionally, David Pugh. Katen had an aggressive prostate cancer with a gleason score of 8.  Based on Medicare criteria he would need two family members with prostate cancer to meet criteria for genetic testing.  We reviewed the characteristics, features and inheritance patterns of hereditary cancer syndromes. We discussed with David Pugh that the family history is not highly consistent with a familial hereditary cancer syndrome, and we feel he is at low risk to harbor a gene mutation associated with such a condition. Thus, we did not recommend any genetic testing, at this time, and recommended David Pugh continue to follow the cancer screening guidelines given by his primary healthcare provider.  Again, I will ask Dr. Whitney Muse to order MSI/IHC testing, with BRAF and Methylation studies if needed.  As an aside, David Pugh indicated that his wife was diagnosed with bilateral breast cancer in her 65s.  She could meet medical criteria for genetic testing and would benefit from genetic counseling with the option of testing.  PLAN: After considering the risks, benefits, and limitations, David Pugh  provided informed consent to pursue MSI/IHC testing on his colon tumor. Results should be available within approximately 2-3 weeks' time, at which point they will be disclosed by telephone to David Pugh, as will any additional recommendations warranted by these results. This information will also be available in Epic. We encouraged David Pugh to remain in contact with cancer genetics annually so that we can continuously update the family history and inform him of any changes in cancer genetics and testing that may be of  benefit for his family. David Pugh. [place] questions were answered to his satisfaction today. Our contact information was provided should additional questions or concerns arise.  Lastly, we encouraged David Pugh. [place] to remain in contact with cancer genetics annually so  that we can continuously update the family history and inform him of any changes in cancer genetics and testing that may be of benefit for this family.   David Pugh.  [place] questions were answered to his satisfaction today. Our contact information was provided should additional questions or concerns arise. Thank you for the referral and allowing [place] to share in the care of your patient.   Braileigh Landenberger P. Florene Glen, [place], [facility] Certified Genetic Counselor [email or web address] phone: [phone]  The patient was seen for a total of 40 minutes in face-to-face genetic counseling.  This patient was discussed with Drs. Magrinat, Lindi Adie and/or Burr Medico who agrees with the above.    _______________________________________________________________________ For Office Staff:  Number of people involved in session: 1 Was an Intern/ student involved with case: no

## 2015-04-02 ENCOUNTER — Encounter (HOSPITAL_COMMUNITY): Payer: Medicare Other | Attending: Cardiovascular Disease

## 2015-04-02 VITALS — BP 103/50 | HR 60 | Temp 98.2°F | Resp 16

## 2015-04-02 DIAGNOSIS — E611 Iron deficiency: Secondary | ICD-10-CM

## 2015-04-02 DIAGNOSIS — Z5189 Encounter for other specified aftercare: Secondary | ICD-10-CM | POA: Insufficient documentation

## 2015-04-02 DIAGNOSIS — Z9861 Coronary angioplasty status: Secondary | ICD-10-CM | POA: Insufficient documentation

## 2015-04-02 MED ORDER — SODIUM CHLORIDE 0.9 % IV SOLN
125.0000 mg | Freq: Once | INTRAVENOUS | Status: AC
  Administered 2015-04-02: 125 mg via INTRAVENOUS
  Filled 2015-04-02: qty 10

## 2015-04-02 MED ORDER — SODIUM CHLORIDE 0.9 % IJ SOLN
10.0000 mL | Freq: Once | INTRAMUSCULAR | Status: AC
  Administered 2015-04-02: 10 mL via INTRAVENOUS

## 2015-04-02 MED ORDER — SODIUM CHLORIDE 0.9 % IV SOLN
INTRAVENOUS | Status: DC
  Administered 2015-04-02: 15:00:00 via INTRAVENOUS

## 2015-04-02 NOTE — Patient Instructions (Signed)
[facility] Discharge Instructions  RECOMMENDATIONS MADE BY THE CONSULTANT AND ANY TEST RESULTS WILL BE SENT TO YOUR REFERRING PHYSICIAN.  Ferric gluconate 125 mg iron infusion given today as ordered. Return as scheduled.  Thank you for choosing [facility] at [facility] to provide your oncology and hematology care.  To afford each patient quality time with our provider, please arrive at least 15 minutes before your scheduled appointment time.    You need to re-schedule your appointment should you arrive 10 or more minutes late.  We strive to give you quality time with our providers, and arriving late affects you and other patients whose appointments are after yours.  Also, if you no show three or more times for appointments you may be dismissed from the clinic at the providers discretion.     Again, thank you for choosing [facility].  Our hope is that these requests will decrease the amount of time that you wait before being seen by our physicians.       _____________________________________________________________  Should you have questions after your visit to [facility], please contact our office at [phone] between the hours of 8:30 a.m. and 4:30 p.m.  Voicemails left after 4:30 p.m. will not be returned until the following business day.  For prescription refill requests, have your pharmacy contact our office.    

## 2015-04-02 NOTE — Progress Notes (Signed)
David Pugh Tolerated iron infusion well Discharged ambulatory

## 2015-04-08 ENCOUNTER — Encounter (HOSPITAL_COMMUNITY): Payer: Self-pay | Admitting: Hematology & Oncology

## 2015-04-08 ENCOUNTER — Other Ambulatory Visit: Payer: Self-pay | Admitting: Cardiovascular Disease

## 2015-04-08 NOTE — Telephone Encounter (Signed)
Rx(s) sent to pharmacy electronically.  

## 2015-04-11 NOTE — Addendum Note (Signed)
Addended by: Hunt Oris on: 04/11/2015 10:31 AM   Modules accepted: Orders

## 2015-04-18 ENCOUNTER — Other Ambulatory Visit: Payer: Self-pay

## 2015-04-18 ENCOUNTER — Ambulatory Visit (INDEPENDENT_AMBULATORY_CARE_PROVIDER_SITE_OTHER): Payer: Medicare Other | Admitting: Gastroenterology

## 2015-04-18 ENCOUNTER — Encounter: Payer: Self-pay | Admitting: Gastroenterology

## 2015-04-18 VITALS — BP 130/75 | HR 65 | Temp 99.3°F | Ht 71.0 in | Wt 228.0 lb

## 2015-04-18 DIAGNOSIS — E611 Iron deficiency: Secondary | ICD-10-CM | POA: Diagnosis not present

## 2015-04-18 DIAGNOSIS — K219 Gastro-esophageal reflux disease without esophagitis: Secondary | ICD-10-CM

## 2015-04-18 DIAGNOSIS — Z85038 Personal history of other malignant neoplasm of large intestine: Secondary | ICD-10-CM

## 2015-04-18 DIAGNOSIS — C182 Malignant neoplasm of ascending colon: Secondary | ICD-10-CM | POA: Diagnosis not present

## 2015-04-18 DIAGNOSIS — D509 Iron deficiency anemia, unspecified: Secondary | ICD-10-CM

## 2015-04-18 MED ORDER — SOD PICOSULFATE-MAG OX-CIT ACD 10-3.5-12 MG-GM-GM PO PACK: 1.0000 | PACK | ORAL | Status: DC

## 2015-04-18 NOTE — Progress Notes (Signed)
Primary Care Physician:  [name], MD  Primary Gastroenterologist:  Garfield Cornea, MD   Chief Complaint  Patient presents with  . Anemia    HPI:  David Pugh is a 80 y.o. male here for further evaluation of recurrent IDA. History of stage III colorectal cancer of the before meals and in: Status post right hemicolectomy in July 2013. Did not tolerate adjuvant Xeloda. History of prostate cancer 2011 status post surgery and radiation. Diagnosed with chronic lymphocytic leukemia in April 2016 currently being monitored. CT of the abdomen and pelvis in October 2015 with no findings to suggest recurrent or metastatic disease in the abdomen or pelvis.  Last colonoscopy December 2014 with evidence of radiation proctitis, scattered left-sided diverticula, two 3 mm polyps removed which were tubular adenomas. Surgical anastomosis appeared normal. Distal 10-15 cm of the neoterminal ileum appeared normal.  Patient reports recurrent iron deficiency anemia. Has required 2 iron infusions since April of this year. Did not tolerate oral iron due to GI upset. Overall he feels well. Bowel movements are regular, 2-3 times per day. No melena rectal bleeding. Denies abdominal pain. He does note that one year ago he had chest pain requiring hospitalization and repeat catheterization. States his cardiologist increased his pantoprazole to twice a day at that time. He was noted to have triple-vessel CAD, patent stents in the mid LAD and proximal intermediate branch. Severe stenosis in the small caliber OM branch, status post balloon angioplasty but cannot put a stent in. Medical management with aspirin and Plavix.  Currently his heartburn is well controlled. He has not tried to back down to once daily therapy. Denies any dysphagia, odynophagia, vomiting, unintentional weight loss.    Current Outpatient Prescriptions  Medication Sig Dispense Refill  . aspirin EC 81 MG tablet Take 81 mg by mouth daily.    . B  Complex Vitamins (VITAMIN B COMPLEX PO) Take 1 tablet by mouth daily.     . camphor-menthol (SARNA) lotion Apply 1 application topically every evening. For knee    . cetirizine (ZYRTEC) 10 MG tablet Take 10 mg by mouth daily.    . citalopram (CELEXA) 10 MG tablet Take 10 mg by mouth every morning.     . clopidogrel (PLAVIX) 75 MG tablet TAKE 1 TABLET DAILY WITH BREAKFAST. 30 tablet 6  . fluticasone (FLONASE) 50 MCG/ACT nasal spray Place 2 sprays into both nostrils every evening.    . folic acid (FOLVITE) 1 MG tablet TAKE 1 TABLET ONCE DAILY. (Patient taking differently: taking once weekly) 30 tablet 5  . isosorbide mononitrate (IMDUR) 30 MG 24 hr tablet TAKE (1) TABLET BY MOUTH ONCE DAILY. 30 tablet 6  . latanoprost (XALATAN) 0.005 % ophthalmic solution Place 1 drop into both eyes at bedtime.     Marland Kitchen losartan (COZAAR) 50 MG tablet Take 25 mg by mouth 2 (two) times daily.     . metoprolol (LOPRESSOR) 50 MG tablet Take 50 mg by mouth 2 (two) times daily.     . Multiple Vitamin (MULTIVITAMIN WITH MINERALS) TABS tablet Take 1 tablet by mouth daily.    Marland Kitchen NITROSTAT 0.4 MG SL tablet PLACE ONE (1) TABLET UNDER TONGUE EVERY 5 MINUTES UP TO (3) DOSES AS NEEDED FOR CHEST PAIN. 25 tablet 1  . pantoprazole (PROTONIX) 40 MG tablet TAKE 2 TABLETS BY MOUTH ONCE DAILY. 60 tablet 6  . pravastatin (PRAVACHOL) 40 MG tablet TAKE 1 TABLET BY MOUTH AT BEDTIME. 30 tablet 10  . Probiotic Product (PROBIOTIC DAILY PO)  Take 1 capsule by mouth daily.     No current facility-administered medications for this visit.    Allergies as of 04/18/2015 - Review Complete 04/18/2015  Allergen Reaction Noted  . Shellfish allergy Anaphylaxis 01/13/2012  . Sulfa antibiotics Diarrhea and Nausea Only 12/14/2012  . Sulfonamide derivatives Diarrhea and Nausea Only 04/30/2009    Past Medical History  Diagnosis Date  . Prostate cancer 2010    s/p radiation, surgery  . Radiation Feb 2011    treatment  . Biliary dyskinesia   . GERD  (gastroesophageal reflux disease)   . Hiatal hernia 2010    tiny  . HTN (hypertension)   . Rosacea   . PTSD (post-traumatic stress disorder)   . Diverticula of colon 2010  . Colon polyps     tubular adenomas  . Arthritis   . Exposure to [employer]   . CAD (coronary artery disease) 2012    a. hx of stent x2 in ramus/OM and mid LAD in 2012  b. balloon angioplaty of OM  . Peripheral neuropathy   . Anemia     hx of anemia as a child   . Colon cancer 12/2011    s/p right hemicolectomy, did not tolerate chemo  . Iron deficiency 05/02/2012    followed by Dr. Tressie [name], iron infusions.   . Folic acid deficiency   . Heart palpitations 10/2012  . Gastroparesis 04/12/2013    borderline  . Myocardial infarction 2012  . Glaucoma   . Abnormality of gait 12/18/2013  . Radiation proctitis   . Family history of prostate cancer   . Family history of stomach cancer     Past Surgical History  Procedure Laterality Date  . Cholecystectomy    . Prostatectomy    . Tonsillectomy    . Transurethral resection of prostate    . Esophagogastroduodenoscopy  10/10/2008    Dr. Tilda Burrow hiatal hernia, normal esophagus, normal stomach  . Colonoscopy  02/12/2004    Dr. Gala Romney- L side diverticula, inflammatory  colon polyps  . Esophagogastroduodenoscopy  02/12/2004    Dr. Dudley Major- normal   . Coronary stent placement  01/2011    2.5x23mm Xience drug-eluting stent in ramus intermedius/OMI vessel, 2.5x31mm Promus Element stent in mid LAD artery  . 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 Multiple colonic  polyps removed as described above. Sigmoid Diverticulosis/ 1.5 x 2 cm relatively flat ulcerated lesion in cecum/path showed adenocarcinoma of the colon. descending colon polyp with tubular adenoma and one fragment of focal high grade dysplasia.   . Knee surgery      left knee   . Partial colectomy  02/29/2012    Procedure: PARTIAL  COLECTOMY;  Surgeon: Adin Hector, MD;  Location: [facility];  Service: General;  Laterality: Right;  . Vasectomy    . Colonoscopy  08/28/2012    JME:[id number] proctitis. Colonic diverticulosis/Ulcerations at the surgical anastomosis  path: ulcerated colonic mucosa with prolapse changes, tubular adenoma.   . Cardiac catheterization  02/13/2011    [facility], [facility] cardiology  . Cataract extraction      2011  . Colonoscopy N/A 08/02/2013    RMR: Colonic polyps -removed as described above. Colonic diverticulosis. Status post reight hemicolectomy. Radiation proctitis- asymptomatic.   . Cataract extraction w/phaco Right 08/06/2013    Procedure: CATARACT EXTRACTION PHACO AND INTRAOCULAR LENS PLACEMENT (IOC);  Surgeon: Tonny Branch, MD;  Location: [facility];  Service: Ophthalmology;  Laterality: Right;  CDE:20.96  . Coronary angioplasty  06/17/14    OM1 POBA  . Left heart catheterization with coronary angiogram N/A 06/17/2014    Procedure: LEFT HEART CATHETERIZATION WITH CORONARY ANGIOGRAM;  Surgeon: Burnell Blanks, MD;  Location: [facility] CATH LAB;  Service: Cardiovascular;  Laterality: N/A;    Family History  Problem Relation Age of Onset  . Throat cancer Father     dx in his 65s and again in his 15s; pipe and cigar smoker  . Neuropathy Brother   . Prostate cancer Brother 59  . Melanoma Brother     dx in his 76s  . Colon cancer Neg Hx   . Cancer Paternal Uncle     smoking related cancer  . Heart attack Paternal Grandfather   . Stomach cancer Cousin 71    Social History   Social History  . Marital Status: Married    Spouse Name: N/A  . Number of Children: 3  . Years of Education: military   Occupational History  . Retired   .     Social History Main Topics  . Smoking status: Never Smoker   . Smokeless tobacco: Never Used  . Alcohol Use: Yes     Comment: 1 cocktail/month  . Drug Use: No  . Sexual Activity: Yes    Birth Control/ Protection: None   Other Topics Concern  .  Not on file   Social History Narrative      ROS:  General: Negative for anorexia, weight loss, fever, chills, fatigue, weakness. Eyes: Negative for vision changes.  ENT: Negative for hoarseness, difficulty swallowing , nasal congestion. CV: Negative for chest pain, angina, palpitations, dyspnea on exertion, peripheral edema.  Respiratory: Negative for dyspnea at rest, dyspnea on exertion, cough, sputum, wheezing.  GI: See history of present illness. GU:  Negative for dysuria, hematuria, urinary incontinence, urinary frequency, nocturnal urination.  MS: Negative for joint pain, low back pain.  Derm: Negative for rash or itching.  Neuro: Negative for weakness, abnormal sensation, seizure, frequent headaches, memory loss, confusion.  Psych: Negative for anxiety, depression, suicidal ideation, hallucinations.  Endo: Negative for unusual weight change.  Heme: Negative for bruising or bleeding. Allergy: Negative for rash or hives.    Physical Examination:  BP 130/75 mmHg  Pulse 65  Temp(Src) 99.3 F (37.4 C) (Oral)  Ht 5\' 11"  (1.803 m)  Wt 228 lb (103.42 kg)  BMI 31.81 kg/m2   General: Well-nourished, well-developed in no acute distress.  Head: Normocephalic, atraumatic.   Eyes: Conjunctiva pink, no icterus. Mouth: Oropharyngeal mucosa moist and pink , no lesions erythema or exudate. Neck: Supple without thyromegaly, masses, or lymphadenopathy.  Lungs: Clear to auscultation bilaterally.  Heart: Regular rate and rhythm, no murmurs rubs or gallops.  Abdomen: Bowel sounds are normal, nontender, nondistended, no hepatosplenomegaly or masses, no abdominal bruits or    hernia , no rebound or guarding.   Rectal: deferred Extremities: No lower extremity edema. No clubbing or deformities.  Neuro: Alert and oriented x 4 , grossly normal neurologically.  Skin: Warm and dry, no rash or jaundice.   Psych: Alert and cooperative, normal mood and affect.  Labs: Lab Results  Component  Value Date   WBC 12.3* 03/14/2015   HGB 14.7 03/14/2015   HCT 44.4 03/14/2015   MCV 94.1 03/14/2015   PLT 295 03/14/2015   Lab Results  Component Value Date   CREATININE 1.17 03/14/2015   BUN 19 03/14/2015   NA 141 03/14/2015   K 4.6 03/14/2015  CL 109 03/14/2015   CO2 27 03/14/2015   Lab Results  Component Value Date   ALT 19 03/14/2015   AST 23 03/14/2015   ALKPHOS 52 03/14/2015   BILITOT 1.0 03/14/2015   Lab Results  Component Value Date   CEA 1.5 03/14/2015   Lab Results  Component Value Date   FERRITIN 80 03/14/2015   Lab Results  Component Value Date   FOLATE >20.0 12/13/2014      Imaging Studies: No results found.  Impression/plan 80 year old gentleman with history of stage III colon cancer status post resection in 2013 who presents with recurrent iron deficiency anemia. Requiring iron infusions 2 in the past 4 months. Hemoglobin has been stable. Has been diagnosed with CLL this spring. Hemoccults status unknown. Generally patient has been feeling well from a GI standpoint although his PPI therapy was increased about a year ago. Unclear whether he requires double dose therapy at this point. This may be contributing to his iron deficiency. Lengthy discussion with the patient today. Would be reasonable given his history of 3 separate malignancies, extensive family history of malignancies, ongoing iron deficiency to offer him a colonoscopy and upper endoscopy for further evaluation.  I have discussed the risks, alternatives, benefits with regards to but not limited to the risk of reaction to medication, bleeding, infection, perforation and the patient is agreeable to proceed. Written consent to be obtained.  Patient request prep he received last time which appeared be adequate (Prepopik). Encouraged adequate hydration prior to prep and during bowel preparation. Split dose prep will be used.

## 2015-04-18 NOTE — Progress Notes (Signed)
cc'ed to pcp °

## 2015-04-18 NOTE — Patient Instructions (Signed)
1. Colonoscopy and upper endoscopy as scheduled. See separate instructions.  

## 2015-05-09 ENCOUNTER — Encounter (HOSPITAL_COMMUNITY): Payer: Self-pay | Admitting: *Deleted

## 2015-05-09 ENCOUNTER — Encounter (HOSPITAL_COMMUNITY)
Admission: RE | Disposition: A | Discharge status: Home Health | Payer: Self-pay | Source: Ambulatory Visit | Attending: Internal Medicine

## 2015-05-09 ENCOUNTER — Ambulatory Visit (HOSPITAL_COMMUNITY)
Admission: RE | Admit: 2015-05-09 | Discharge: 2015-05-09 | Disposition: A | Discharge status: Home Health | Payer: Medicare Other | Source: Ambulatory Visit | Attending: Internal Medicine | Admitting: Internal Medicine

## 2015-05-09 DIAGNOSIS — Z8042 Family history of malignant neoplasm of prostate: Secondary | ICD-10-CM | POA: Diagnosis not present

## 2015-05-09 DIAGNOSIS — Z77098 Contact with and (suspected) exposure to other hazardous, chiefly nonmedicinal, chemicals: Secondary | ICD-10-CM | POA: Diagnosis not present

## 2015-05-09 DIAGNOSIS — F4310 Post-traumatic stress disorder, unspecified: Secondary | ICD-10-CM | POA: Diagnosis not present

## 2015-05-09 DIAGNOSIS — I2510 Atherosclerotic heart disease of native coronary artery without angina pectoris: Secondary | ICD-10-CM | POA: Diagnosis not present

## 2015-05-09 DIAGNOSIS — I10 Essential (primary) hypertension: Secondary | ICD-10-CM | POA: Diagnosis not present

## 2015-05-09 DIAGNOSIS — Z86010 Personal history of colonic polyps: Secondary | ICD-10-CM | POA: Diagnosis not present

## 2015-05-09 DIAGNOSIS — K317 Polyp of stomach and duodenum: Secondary | ICD-10-CM | POA: Diagnosis not present

## 2015-05-09 DIAGNOSIS — Z8546 Personal history of malignant neoplasm of prostate: Secondary | ICD-10-CM | POA: Insufficient documentation

## 2015-05-09 DIAGNOSIS — Z802 Family history of malignant neoplasm of other respiratory and intrathoracic organs: Secondary | ICD-10-CM | POA: Diagnosis not present

## 2015-05-09 DIAGNOSIS — Z79899 Other long term (current) drug therapy: Secondary | ICD-10-CM | POA: Insufficient documentation

## 2015-05-09 DIAGNOSIS — K5730 Diverticulosis of large intestine without perforation or abscess without bleeding: Secondary | ICD-10-CM | POA: Insufficient documentation

## 2015-05-09 DIAGNOSIS — Z7951 Long term (current) use of inhaled steroids: Secondary | ICD-10-CM | POA: Diagnosis not present

## 2015-05-09 DIAGNOSIS — Z9049 Acquired absence of other specified parts of digestive tract: Secondary | ICD-10-CM | POA: Diagnosis not present

## 2015-05-09 DIAGNOSIS — K627 Radiation proctitis: Secondary | ICD-10-CM | POA: Insufficient documentation

## 2015-05-09 DIAGNOSIS — K3189 Other diseases of stomach and duodenum: Secondary | ICD-10-CM | POA: Diagnosis not present

## 2015-05-09 DIAGNOSIS — K229 Disease of esophagus, unspecified: Secondary | ICD-10-CM | POA: Diagnosis not present

## 2015-05-09 DIAGNOSIS — K319 Disease of stomach and duodenum, unspecified: Secondary | ICD-10-CM | POA: Insufficient documentation

## 2015-05-09 DIAGNOSIS — K2289 Other specified disease of esophagus: Secondary | ICD-10-CM | POA: Insufficient documentation

## 2015-05-09 DIAGNOSIS — K219 Gastro-esophageal reflux disease without esophagitis: Secondary | ICD-10-CM | POA: Diagnosis not present

## 2015-05-09 DIAGNOSIS — Z7982 Long term (current) use of aspirin: Secondary | ICD-10-CM | POA: Diagnosis not present

## 2015-05-09 DIAGNOSIS — D124 Benign neoplasm of descending colon: Secondary | ICD-10-CM | POA: Insufficient documentation

## 2015-05-09 DIAGNOSIS — D509 Iron deficiency anemia, unspecified: Secondary | ICD-10-CM | POA: Insufficient documentation

## 2015-05-09 DIAGNOSIS — Z85038 Personal history of other malignant neoplasm of large intestine: Secondary | ICD-10-CM | POA: Insufficient documentation

## 2015-05-09 DIAGNOSIS — Z7902 Long term (current) use of antithrombotics/antiplatelets: Secondary | ICD-10-CM | POA: Diagnosis not present

## 2015-05-09 DIAGNOSIS — Z923 Personal history of irradiation: Secondary | ICD-10-CM | POA: Insufficient documentation

## 2015-05-09 DIAGNOSIS — Z856 Personal history of leukemia: Secondary | ICD-10-CM | POA: Insufficient documentation

## 2015-05-09 HISTORY — PX: COLONOSCOPY: SHX5424

## 2015-05-09 HISTORY — PX: ESOPHAGOGASTRODUODENOSCOPY: SHX5428

## 2015-05-09 SURGERY — COLONOSCOPY
Anesthesia: Moderate Sedation

## 2015-05-09 MED ORDER — SIMETHICONE 40 MG/0.6ML PO SUSP
ORAL | Status: DC | PRN
  Administered 2015-05-09: 11:00:00

## 2015-05-09 MED ORDER — MEPERIDINE HCL 100 MG/ML IJ SOLN
INTRAMUSCULAR | Status: AC
  Filled 2015-05-09: qty 2

## 2015-05-09 MED ORDER — LIDOCAINE VISCOUS 2 % MT SOLN
OROMUCOSAL | Status: DC | PRN
  Administered 2015-05-09: 3 mL via OROMUCOSAL

## 2015-05-09 MED ORDER — MIDAZOLAM HCL 5 MG/5ML IJ SOLN
INTRAMUSCULAR | Status: AC
  Filled 2015-05-09: qty 10

## 2015-05-09 MED ORDER — MIDAZOLAM HCL 5 MG/5ML IJ SOLN
INTRAMUSCULAR | Status: DC | PRN
  Administered 2015-05-09 (×3): 1 mg via INTRAVENOUS
  Administered 2015-05-09: 2 mg via INTRAVENOUS

## 2015-05-09 MED ORDER — SODIUM CHLORIDE 0.9 % IV SOLN
INTRAVENOUS | Status: DC
  Administered 2015-05-09: 11:00:00 via INTRAVENOUS

## 2015-05-09 MED ORDER — MEPERIDINE HCL 100 MG/ML IJ SOLN
INTRAMUSCULAR | Status: DC | PRN
  Administered 2015-05-09: 50 mg via INTRAVENOUS
  Administered 2015-05-09 (×2): 25 mg via INTRAVENOUS

## 2015-05-09 MED ORDER — ONDANSETRON HCL 4 MG/2ML IJ SOLN
INTRAMUSCULAR | Status: DC | PRN
  Administered 2015-05-09: 4 mg via INTRAVENOUS

## 2015-05-09 MED ORDER — ONDANSETRON HCL 4 MG/2ML IJ SOLN
INTRAMUSCULAR | Status: AC
  Filled 2015-05-09: qty 2

## 2015-05-09 MED ORDER — LIDOCAINE VISCOUS 2 % MT SOLN
OROMUCOSAL | Status: AC
  Filled 2015-05-09: qty 15

## 2015-05-09 NOTE — Op Note (Signed)
[facility] [address] [place], [address]   ENDOSCOPY PROCEDURE REPORT  PATIENT: Chaney, Maclaren  MR#: [id number] BIRTHDATE: Sep 16, 1934 , 57  yrs. old GENDER: male ENDOSCOPIST: R.  Garfield Cornea, MD FACP FACG REFERRED BY:  Matthias Hughs, M.D.  Ancil Linsey, MD PROCEDURE DATE:  May 25, 2015 PROCEDURE:  EGD with biopsy INDICATIONS:  Iron deficiency anemia. MEDICATIONS: Versed 4 mg IV and Demerol 75 mg IV in divided doses. Zofran 4 mg IV.  Xylocaine gel orally ASA CLASS:      Class III  CONSENT: The risks, benefits, limitations, alternatives and imponderables have been discussed.  The potential for biopsy, esophogeal dilation, etc. have also been reviewed.  Questions have been answered.  All parties agreeable.  Please see the history and physical in the medical record for more information.  DESCRIPTION OF PROCEDURE: After the risks benefits and alternatives of the procedure were thoroughly explained, informed consent was obtained.  The EG-[id number] ([id number]) endoscope was introduced through the mouth and advanced to the second portion of the duodenum , limited by Without limitations. The instrument was slowly withdrawn as the mucosa was fully examined. Estimated blood loss is zero unless otherwise noted in this procedure report.    3 "tongues" of salmon-colored epithelium coming up approximately 1.5 cm above the GE junction.  There was no nodularity.  No esophagitis.  The tubular esophagus was patent throughout its course.  Stomach empty.  Patient had multiple antral erosions. There were also multiple 2-3 mm hyperplastic appearing gastric polyps present.  No ulcer or infiltrating process seen.  Patent pylorus.  Normal-appearing first and second portion of the duodenum.  The gastric mucosa was biopsied.  One of the polyps was biopsied. The abnormal appearing distal esophagus was also biopsied. Retroflexed views revealed as previously described.     The  scope was then withdrawn from the patient and the procedure completed.  COMPLICATIONS: There were no immediate complications. EBL 4 mL ENDOSCOPIC IMPRESSION: Abnormal distal esophagus?"query short segment Barrett's. Gastric erosions. Gastric polyps?"status post biopsy  RECOMMENDATIONS: Follow-up multiple pathology. See colonoscopy report.  REPEAT EXAM:  eSigned:  R. Garfield Cornea, MD Rosalita Chessman [facility] 05/25/2015 11:28 AM    CC:  CPT CODES: ICD CODES:  The ICD and CPT codes recommended by this software are interpretations from the data that the clinical staff has captured with the software.  The verification of the translation of this report to the ICD and CPT codes and modifiers is the sole responsibility of the health care institution and practicing physician where this report was generated.  [facility] will not be held responsible for the validity of the ICD and CPT codes included on this report.  AMA assumes no liability for data contained or not contained herein. CPT is a Designer, television/film set of the [employer].  PATIENT NAME:  Sylas, Twombly MR#: [id number]

## 2015-05-09 NOTE — Discharge Instructions (Signed)
Colonoscopy Discharge Instructions  Read the instructions outlined below and refer to this sheet in the next few weeks. These discharge instructions provide you with general information on caring for yourself after you leave the hospital. Your doctor may also give you specific instructions. While your treatment has been planned according to the most current medical practices available, unavoidable complications occasionally occur. If you have any problems or questions after discharge, call Dr. Gala Romney at [phone]. ACTIVITY  You may resume your regular activity, but move at a slower pace for the next 24 hours.   Take frequent rest periods for the next 24 hours.   Walking will help get rid of the air and reduce the bloated feeling in your belly (abdomen).   No driving for 24 hours (because of the medicine (anesthesia) used during the test).    Do not sign any important legal documents or operate any machinery for 24 hours (because of the anesthesia used during the test).  NUTRITION  Drink plenty of fluids.   You may resume your normal diet as instructed by your doctor.   Begin with a light meal and progress to your normal diet. Heavy or fried foods are harder to digest and may make you feel sick to your stomach (nauseated).   Avoid alcoholic beverages for 24 hours or as instructed.  MEDICATIONS  You may resume your normal medications unless your doctor tells you otherwise.  WHAT YOU CAN EXPECT TODAY  Some feelings of bloating in the abdomen.   Passage of more gas than usual.   Spotting of blood in your stool or on the toilet paper.  IF YOU HAD POLYPS REMOVED DURING THE COLONOSCOPY:  No aspirin products for 7 days or as instructed.   No alcohol for 7 days or as instructed.   Eat a soft diet for the next 24 hours.  FINDING OUT THE RESULTS OF YOUR TEST Not all test results are available during your visit. If your test results are not back during the visit, make an appointment  with your caregiver to find out the results. Do not assume everything is normal if you have not heard from your caregiver or the medical facility. It is important for you to follow up on all of your test results.  SEEK IMMEDIATE MEDICAL ATTENTION IF:  You have more than a spotting of blood in your stool.   Your belly is swollen (abdominal distention).   You are nauseated or vomiting.   You have a temperature over 101.  You have abdominal pain or discomfort that is severe or gets worse throughout the day. EGD Discharge instructions Please read the instructions outlined below and refer to this sheet in the next few weeks. These discharge instructions provide you with general information on caring for yourself after you leave the hospital. Your doctor may also give you specific instructions. While your treatment has been planned according to the most current medical practices available, unavoidable complications occasionally occur. If you have any problems or questions after discharge, please call your doctor. ACTIVITY You may resume your regular activity but move at a slower pace for the next 24 hours.  Take frequent rest periods for the next 24 hours.  Walking will help expel (get rid of) the air and reduce the bloated feeling in your abdomen.  No driving for 24 hours (because of the anesthesia (medicine) used during the test).  You may shower.  Do not sign any important legal documents or operate any machinery for 24  hours (because of the anesthesia used during the test).  NUTRITION Drink plenty of fluids.  You may resume your normal diet.  Begin with a light meal and progress to your normal diet.  Avoid alcoholic beverages for 24 hours or as instructed by your caregiver.  MEDICATIONS You may resume your normal medications unless your caregiver tells you otherwise.  WHAT YOU CAN EXPECT TODAY You may experience abdominal discomfort such as a feeling of fullness or gas pains.   FOLLOW-UP Your doctor will discuss the results of your test with you.  SEEK IMMEDIATE MEDICAL ATTENTION IF ANY OF THE FOLLOWING OCCUR: Excessive nausea (feeling sick to your stomach) and/or vomiting.  Severe abdominal pain and distention (swelling).  Trouble swallowing.  Temperature over 101 F (37.8 C).  Rectal bleeding or vomiting of blood.   Diverticulosis Diverticulosis is the condition that develops when small pouches (diverticula) form in the wall of your colon. Your colon, or large intestine, is where water is absorbed and stool is formed. The pouches form when the inside layer of your colon pushes through weak spots in the outer layers of your colon. CAUSES  No one knows exactly what causes diverticulosis. RISK FACTORS  Being older than 65. Your risk for this condition increases with age. Diverticulosis is rare in people younger than 40 years. By age 32, almost everyone has it.  Eating a low-fiber diet.  Being frequently constipated.  Being overweight.  Not getting enough exercise.  Smoking.  Taking over-the-counter pain medicines, like aspirin and ibuprofen. SYMPTOMS  Most people with diverticulosis do not have symptoms. DIAGNOSIS  Because diverticulosis often has no symptoms, health care providers often discover the condition during an exam for other colon problems. In many cases, a health care provider will diagnose diverticulosis while using a flexible scope to examine the colon (colonoscopy). TREATMENT  If you have never developed an infection related to diverticulosis, you may not need treatment. If you have had an infection before, treatment may include:  Eating more fruits, vegetables, and grains.  Taking a fiber supplement.  Taking a live bacteria supplement (probiotic).  Taking medicine to relax your colon. HOME CARE INSTRUCTIONS   Drink at least 6-8 glasses of water each day to prevent constipation.  Try not to strain when you have a bowel  movement.  Keep all follow-up appointments. If you have had an infection before:  Increase the fiber in your diet as directed by your health care provider or dietitian.  Take a dietary fiber supplement if your health care provider approves.  Only take medicines as directed by your health care provider. SEEK MEDICAL CARE IF:   You have abdominal pain.  You have bloating.  You have cramps.  You have not gone to the bathroom in 3 days. SEEK IMMEDIATE MEDICAL CARE IF:   Your pain gets worse.  Yourbloating becomes very bad.  You have a fever or chills, and your symptoms suddenly get worse.  You begin vomiting.  You have bowel movements that are bloody or black. MAKE SURE YOU:  Understand these instructions.  Will watch your condition.  Will get help right away if you are not doing well or get worse. Document Released: 05/13/2004 Document Revised: 08/21/2013 Document Reviewed: 07/11/2013 [facility] Patient Information 2015 [place], [place]. This information is not intended to replace advice given to you by your health care provider. Make sure you discuss any questions you have with your health care provider.  Colon Polyps Polyps are lumps of extra tissue growing  inside the body. Polyps can grow in the large intestine (colon). Most colon polyps are noncancerous (benign). However, some colon polyps can become cancerous over time. Polyps that are larger than a pea may be harmful. To be safe, caregivers remove and test all polyps. CAUSES  Polyps form when mutations in the genes cause your cells to grow and divide even though no more tissue is needed. RISK FACTORS There are a number of risk factors that can increase your chances of getting colon polyps. They include:  Being older than 50 years.  Family history of colon polyps or colon cancer.  Long-term colon diseases, such as colitis or Crohn disease.  Being overweight.  Smoking.  Being inactive.  Drinking too much  alcohol. SYMPTOMS  Most small polyps do not cause symptoms. If symptoms are present, they may include:  Blood in the stool. The stool may look dark red or black.  Constipation or diarrhea that lasts longer than 1 week. DIAGNOSIS People often do not know they have polyps until their caregiver finds them during a regular checkup. Your caregiver can use 4 tests to check for polyps:  Digital rectal exam. The caregiver wears gloves and feels inside the rectum. This test would find polyps only in the rectum.  Barium enema. The caregiver puts a liquid called barium into your rectum before taking X-rays of your colon. Barium makes your colon look white. Polyps are dark, so they are easy to see in the X-ray pictures.  Sigmoidoscopy. A thin, flexible tube (sigmoidoscope) is placed into your rectum. The sigmoidoscope has a light and tiny camera in it. The caregiver uses the sigmoidoscope to look at the last third of your colon.  Colonoscopy. This test is like sigmoidoscopy, but the caregiver looks at the entire colon. This is the most common method for finding and removing polyps. TREATMENT  Any polyps will be removed during a sigmoidoscopy or colonoscopy. The polyps are then tested for cancer. PREVENTION  To help lower your risk of getting more colon polyps:  Eat plenty of fruits and vegetables. Avoid eating fatty foods.  Do not smoke.  Avoid drinking alcohol.  Exercise every day.  Lose weight if recommended by your caregiver.  Eat plenty of calcium and folate. Foods that are rich in calcium include milk, cheese, and broccoli. Foods that are rich in folate include chickpeas, kidney beans, and spinach. HOME CARE INSTRUCTIONS Keep all follow-up appointments as directed by your caregiver. You may need periodic exams to check for polyps. SEEK MEDICAL CARE IF: You notice bleeding during a bowel movement. Document Released: 05/12/2004 Document Revised: 11/08/2011 Document Reviewed:  10/26/2011 [facility] Patient Information 2015 [place], [place]. This information is not intended to replace advice given to you by your health care provider. Make sure you discuss any questions you have with your health care provider.   Diverticulosis and colon polyp information provided  Further recommendations to follow pending review of pathology report  Diverticulosis Diverticulosis is the condition that develops when small pouches (diverticula) form in the wall of your colon. Your colon, or large intestine, is where water is absorbed and stool is formed. The pouches form when the inside layer of your colon pushes through weak spots in the outer layers of your colon. CAUSES  No one knows exactly what causes diverticulosis. RISK FACTORS  Being older than 23. Your risk for this condition increases with age. Diverticulosis is rare in people younger than 40 years. By age 74, almost everyone has it.  Eating a  low-fiber diet.  Being frequently constipated.  Being overweight.  Not getting enough exercise.  Smoking.  Taking over-the-counter pain medicines, like aspirin and ibuprofen. SYMPTOMS  Most people with diverticulosis do not have symptoms. DIAGNOSIS  Because diverticulosis often has no symptoms, health care providers often discover the condition during an exam for other colon problems. In many cases, a health care provider will diagnose diverticulosis while using a flexible scope to examine the colon (colonoscopy). TREATMENT  If you have never developed an infection related to diverticulosis, you may not need treatment. If you have had an infection before, treatment may include:  Eating more fruits, vegetables, and grains.  Taking a fiber supplement.  Taking a live bacteria supplement (probiotic).  Taking medicine to relax your colon. HOME CARE INSTRUCTIONS   Drink at least 6-8 glasses of water each day to prevent constipation.  Try not to strain when you have a bowel  movement.  Keep all follow-up appointments. If you have had an infection before:  Increase the fiber in your diet as directed by your health care provider or dietitian.  Take a dietary fiber supplement if your health care provider approves.  Only take medicines as directed by your health care provider. SEEK MEDICAL CARE IF:   You have abdominal pain.  You have bloating.  You have cramps.  You have not gone to the bathroom in 3 days. SEEK IMMEDIATE MEDICAL CARE IF:   Your pain gets worse.  Yourbloating becomes very bad.  You have a fever or chills, and your symptoms suddenly get worse.  You begin vomiting.  You have bowel movements that are bloody or black. MAKE SURE YOU:  Understand these instructions.  Will watch your condition.  Will get help right away if you are not doing well or get worse.  Colon Polyps Polyps are lumps of extra tissue growing inside the body. Polyps can grow in the large intestine (colon). Most colon polyps are noncancerous (benign). However, some colon polyps can become cancerous over time. Polyps that are larger than a pea may be harmful. To be safe, caregivers remove and test all polyps. CAUSES  Polyps form when mutations in the genes cause your cells to grow and divide even though no more tissue is needed. RISK FACTORS There are a number of risk factors that can increase your chances of getting colon polyps. They include:  Being older than 50 years.  Family history of colon polyps or colon cancer.  Long-term colon diseases, such as colitis or Crohn disease.  Being overweight.  Smoking.  Being inactive.  Drinking too much alcohol. SYMPTOMS  Most small polyps do not cause symptoms. If symptoms are present, they may include:  Blood in the stool. The stool may look dark red or black.  Constipation or diarrhea that lasts longer than 1 week. DIAGNOSIS People often do not know they have polyps until their caregiver finds them  during a regular checkup. Your caregiver can use 4 tests to check for polyps:  Digital rectal exam. The caregiver wears gloves and feels inside the rectum. This test would find polyps only in the rectum.  Barium enema. The caregiver puts a liquid called barium into your rectum before taking X-rays of your colon. Barium makes your colon look white. Polyps are dark, so they are easy to see in the X-ray pictures.  Sigmoidoscopy. A thin, flexible tube (sigmoidoscope) is placed into your rectum. The sigmoidoscope has a light and tiny camera in it. The caregiver uses the sigmoidoscope  to look at the last third of your colon.  Colonoscopy. This test is like sigmoidoscopy, but the caregiver looks at the entire colon. This is the most common method for finding and removing polyps. TREATMENT  Any polyps will be removed during a sigmoidoscopy or colonoscopy. The polyps are then tested for cancer. PREVENTION  To help lower your risk of getting more colon polyps:  Eat plenty of fruits and vegetables. Avoid eating fatty foods.  Do not smoke.  Avoid drinking alcohol.  Exercise every day.  Lose weight if recommended by your caregiver.  Eat plenty of calcium and folate. Foods that are rich in calcium include milk, cheese, and broccoli. Foods that are rich in folate include chickpeas, kidney beans, and spinach. HOME CARE INSTRUCTIONS Keep all follow-up appointments as directed by your caregiver. You may need periodic exams to check for polyps. SEEK MEDICAL CARE IF: You notice bleeding during a bowel movement.

## 2015-05-09 NOTE — Op Note (Signed)
[facility] [address] [place], [address]   COLONOSCOPY PROCEDURE REPORT  PATIENT: David Pugh, David Pugh  MR#: [id number] BIRTHDATE: Dec 16, 1934 , 71  yrs. old GENDER: male ENDOSCOPIST: R.  Garfield Cornea, MD FACP [facility] REFERRED [id number] [name], M.D. PROCEDURE DATE:  05/31/15 PROCEDURE:   Colonoscopy with snare polypectomy INDICATIONS:iron deficiency anemia; history of colon cancer. MEDICATIONS: Versed 5 mg IV and Demerol 100 mg IV in divided doses. Zofran 4 mg IV. ASA CLASS:       Class III  CONSENT: The risks, benefits, alternatives and imponderables including but not limited to bleeding, perforation as well as the possibility of a missed lesion have been reviewed.  The potential for biopsy, lesion removal, etc. have also been discussed. Questions have been answered.  All parties agreeable.  Please see the history and physical in the medical record for more information.  DESCRIPTION OF PROCEDURE:   After the risks benefits and alternatives of the procedure were thoroughly explained, informed consent was obtained.  The digital rectal exam revealed no abnormalities of the rectum.   The [id number] ([id number])  endoscope was introduced through the anus and advanced to the surgical anastomosis. No adverse events experienced.   The quality of the prep was adequate  The instrument was then slowly withdrawn as the colon was fully examined. Estimated blood loss is zero unless otherwise noted in this procedure report.      COLON FINDINGS: Neovascular changes the rectum consistent with known diagnosis of radiation proctitis.  Rectal vault small?"was seen well on?"face.  I did not attempt to retroflex.  Patient had scattered residual colonic diverticulosis.  Normal-appearing surgical anastomosis identified.  The distal 10 cm of neoterminal ileum was intubated.  This segment of the GI tract appeared normal.  The patient was noted to have (3) 5 mm polyps in the mid  descending segment; the remainder of the colonic mucosa appeared normal.  The above-mentioned polyps are cold snare removed and recovered for the pathologist.  Retroflexion was not performed. .  Withdrawal time=14 minutes 0 seconds.  The scope was withdrawn and the procedure completed. COMPLICATIONS: There were no immediate complications. EBL 5 mL ENDOSCOPIC IMPRESSION: Status post right hemicolectomy. Radiation proctitis?"asymptomatic. Colonic diverticulosis. Colonic polyps?"removed as described above. No evidence of recurrent colon cancer  RECOMMENDATIONS: Follow-up on pathology. See EGD report. We'll consider decreasing PPI therapy to once daily given history of iron deficiency pending review of pathology report.  eSigned:  R. Garfield Cornea, MD Rosalita Chessman [facility] 05/31/15 11:59 AM   cc:  CPT CODES: ICD CODES:  The ICD and CPT codes recommended by this software are interpretations from the data that the clinical staff has captured with the software.  The verification of the translation of this report to the ICD and CPT codes and modifiers is the sole responsibility of the health care institution and practicing physician where this report was generated.  [place]. will not be held responsible for the validity of the ICD and CPT codes included on this report.  AMA assumes no liability for data contained or not contained herein. CPT is a Designer, television/film set of the [employer].  PATIENT NAME:  David Pugh, David Pugh MR#: [id number]

## 2015-05-09 NOTE — H&P (View-Only) (Signed)
Primary Care Physician:  [name],[name], MD  Primary Gastroenterologist:  Michael Rourk, MD   Chief Complaint  Patient presents with  . Anemia    HPI:  David Pugh is a 80 y.o. male here for further evaluation of recurrent IDA. History of stage III colorectal cancer of the before meals and in: Status post right hemicolectomy in July 2013. Did not tolerate adjuvant Xeloda. History of prostate cancer 2011 status post surgery and radiation. Diagnosed with chronic lymphocytic leukemia in April 2016 currently being monitored. CT of the abdomen and pelvis in October 2015 with no findings to suggest recurrent or metastatic disease in the abdomen or pelvis.  Last colonoscopy December 2014 with evidence of radiation proctitis, scattered left-sided diverticula, two 3 mm polyps removed which were tubular adenomas. Surgical anastomosis appeared normal. Distal 10-15 cm of the neoterminal ileum appeared normal.  Patient reports recurrent iron deficiency anemia. Has required 2 iron infusions since April of this year. Did not tolerate oral iron due to GI upset. Overall he feels well. Bowel movements are regular, 2-3 times per day. No melena rectal bleeding. Denies abdominal pain. He does note that one year ago he had chest pain requiring hospitalization and repeat catheterization. States his cardiologist increased his pantoprazole to twice a day at that time. He was noted to have triple-vessel CAD, patent stents in the mid LAD and proximal intermediate branch. Severe stenosis in the small caliber OM branch, status post balloon angioplasty but cannot put a stent in. Medical management with aspirin and Plavix.  Currently his heartburn is well controlled. He has not tried to back down to once daily therapy. Denies any dysphagia, odynophagia, vomiting, unintentional weight loss.    Current Outpatient Prescriptions  Medication Sig Dispense Refill  . aspirin EC 81 MG tablet Take 81 mg by mouth daily.    . B  Complex Vitamins (VITAMIN B COMPLEX PO) Take 1 tablet by mouth daily.     . camphor-menthol (SARNA) lotion Apply 1 application topically every evening. For knee    . cetirizine (ZYRTEC) 10 MG tablet Take 10 mg by mouth daily.    . citalopram (CELEXA) 10 MG tablet Take 10 mg by mouth every morning.     . clopidogrel (PLAVIX) 75 MG tablet TAKE 1 TABLET DAILY WITH BREAKFAST. 30 tablet 6  . fluticasone (FLONASE) 50 MCG/ACT nasal spray Place 2 sprays into both nostrils every evening.    . folic acid (FOLVITE) 1 MG tablet TAKE 1 TABLET ONCE DAILY. (Patient taking differently: taking once weekly) 30 tablet 5  . isosorbide mononitrate (IMDUR) 30 MG 24 hr tablet TAKE (1) TABLET BY MOUTH ONCE DAILY. 30 tablet 6  . latanoprost (XALATAN) 0.005 % ophthalmic solution Place 1 drop into both eyes at bedtime.     . losartan (COZAAR) 50 MG tablet Take 25 mg by mouth 2 (two) times daily.     . metoprolol (LOPRESSOR) 50 MG tablet Take 50 mg by mouth 2 (two) times daily.     . Multiple Vitamin (MULTIVITAMIN WITH MINERALS) TABS tablet Take 1 tablet by mouth daily.    . NITROSTAT 0.4 MG SL tablet PLACE ONE (1) TABLET UNDER TONGUE EVERY 5 MINUTES UP TO (3) DOSES AS NEEDED FOR CHEST PAIN. 25 tablet 1  . pantoprazole (PROTONIX) 40 MG tablet TAKE 2 TABLETS BY MOUTH ONCE DAILY. 60 tablet 6  . pravastatin (PRAVACHOL) 40 MG tablet TAKE 1 TABLET BY MOUTH AT BEDTIME. 30 tablet 10  . Probiotic Product (PROBIOTIC DAILY PO)   Take 1 capsule by mouth daily.     No current facility-administered medications for this visit.    Allergies as of 04/18/2015 - Review Complete 04/18/2015  Allergen Reaction Noted  . Shellfish allergy Anaphylaxis 01/13/2012  . Sulfa antibiotics Diarrhea and Nausea Only 12/14/2012  . Sulfonamide derivatives Diarrhea and Nausea Only 04/30/2009    Past Medical History  Diagnosis Date  . Prostate cancer 2010    s/p radiation, surgery  . Radiation Feb 2011    treatment  . Biliary dyskinesia   . GERD  (gastroesophageal reflux disease)   . Hiatal hernia 2010    tiny  . HTN (hypertension)   . Rosacea   . PTSD (post-traumatic stress disorder)   . Diverticula of colon 2010  . Colon polyps     tubular adenomas  . Arthritis   . Exposure to Agent Orange   . CAD (coronary artery disease) 2012    a. hx of stent x2 in ramus/OM and mid LAD in 2012  b. balloon angioplaty of OM  . Peripheral neuropathy   . Anemia     hx of anemia as a child   . Colon cancer 12/2011    s/p right hemicolectomy, did not tolerate chemo  . Iron deficiency 05/02/2012    followed by Dr. Neijstrom, iron infusions.   . Folic acid deficiency   . Heart palpitations 10/2012  . Gastroparesis 04/12/2013    borderline  . Myocardial infarction 2012  . Glaucoma   . Abnormality of gait 12/18/2013  . Radiation proctitis   . Family history of prostate cancer   . Family history of stomach cancer     Past Surgical History  Procedure Laterality Date  . Cholecystectomy    . Prostatectomy    . Tonsillectomy    . Transurethral resection of prostate    . Esophagogastroduodenoscopy  10/10/2008    Dr. Rourk-tiny hiatal hernia, normal esophagus, normal stomach  . Colonoscopy  02/12/2004    Dr. Rourk- L side diverticula, inflammatory  colon polyps  . Esophagogastroduodenoscopy  02/12/2004    Dr. Rouk- normal   . Coronary stent placement  01/2011    2.5x33mm Xience drug-eluting stent in ramus intermedius/OMI vessel, 2.5x60mm Promus Element stent in mid LAD artery  . 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 Multiple colonic  polyps removed as described above. Sigmoid Diverticulosis/ 1.5 x 2 cm relatively flat ulcerated lesion in cecum/path showed adenocarcinoma of the colon. descending colon polyp with tubular adenoma and one fragment of focal high grade dysplasia.   . Knee surgery      left knee   . Partial colectomy  02/29/2012    Procedure: PARTIAL  COLECTOMY;  Surgeon: Haywood M Ingram, MD;  Location: [facility];  Service: General;  Laterality: Right;  . Vasectomy    . Colonoscopy  08/28/2012    RMR:Radiation proctitis. Colonic diverticulosis/Ulcerations at the surgical anastomosis  path: ulcerated colonic mucosa with prolapse changes, tubular adenoma.   . Cardiac catheterization  02/13/2011    [facility]  . Cataract extraction      2011  . Colonoscopy N/A 08/02/2013    RMR: Colonic polyps -removed as described above. Colonic diverticulosis. Status post reight hemicolectomy. Radiation proctitis- asymptomatic.   . Cataract extraction w/phaco Right 08/06/2013    Procedure: CATARACT EXTRACTION PHACO AND INTRAOCULAR LENS PLACEMENT (IOC);  Surgeon: Kerry Hunt, MD;  Location: [facility];  Service: Ophthalmology;  Laterality: Right;    CDE:20.96  . Coronary angioplasty  06/17/14    OM1 POBA  . Left heart catheterization with coronary angiogram N/A 06/17/2014    Procedure: LEFT HEART CATHETERIZATION WITH CORONARY ANGIOGRAM;  Surgeon: Christopher D McAlhany, MD;  Location: [facility];  Service: Cardiovascular;  Laterality: N/A;    Family History  Problem Relation Age of Onset  . Throat cancer Father     dx in his 50s and again in his 70s; pipe and cigar smoker  . Neuropathy Brother   . Prostate cancer Brother 69  . Melanoma Brother     dx in his 40s  . Colon cancer Neg Hx   . Cancer Paternal Uncle     smoking related cancer  . Heart attack Paternal Grandfather   . Stomach cancer Cousin 71    Social History   Social History  . Marital Status: Married    Spouse Name: N/A  . Number of Children: 3  . Years of Education: military   Occupational History  . Retired   .     Social History Main Topics  . Smoking status: Never Smoker   . Smokeless tobacco: Never Used  . Alcohol Use: Yes     Comment: 1 cocktail/month  . Drug Use: No  . Sexual Activity: Yes    Birth Control/ Protection: None   Other Topics Concern  .  Not on file   Social History Narrative      ROS:  General: Negative for anorexia, weight loss, fever, chills, fatigue, weakness. Eyes: Negative for vision changes.  ENT: Negative for hoarseness, difficulty swallowing , nasal congestion. CV: Negative for chest pain, angina, palpitations, dyspnea on exertion, peripheral edema.  Respiratory: Negative for dyspnea at rest, dyspnea on exertion, cough, sputum, wheezing.  GI: See history of present illness. GU:  Negative for dysuria, hematuria, urinary incontinence, urinary frequency, nocturnal urination.  MS: Negative for joint pain, low back pain.  Derm: Negative for rash or itching.  Neuro: Negative for weakness, abnormal sensation, seizure, frequent headaches, memory loss, confusion.  Psych: Negative for anxiety, depression, suicidal ideation, hallucinations.  Endo: Negative for unusual weight change.  Heme: Negative for bruising or bleeding. Allergy: Negative for rash or hives.    Physical Examination:  BP 130/75 mmHg  Pulse 65  Temp(Src) 99.3 F (37.4 C) (Oral)  Ht 5' 11" (1.803 m)  Wt 228 lb (103.42 kg)  BMI 31.81 kg/m2   General: Well-nourished, well-developed in no acute distress.  Head: Normocephalic, atraumatic.   Eyes: Conjunctiva pink, no icterus. Mouth: Oropharyngeal mucosa moist and pink , no lesions erythema or exudate. Neck: Supple without thyromegaly, masses, or lymphadenopathy.  Lungs: Clear to auscultation bilaterally.  Heart: Regular rate and rhythm, no murmurs rubs or gallops.  Abdomen: Bowel sounds are normal, nontender, nondistended, no hepatosplenomegaly or masses, no abdominal bruits or    hernia , no rebound or guarding.   Rectal: deferred Extremities: No lower extremity edema. No clubbing or deformities.  Neuro: Alert and oriented x 4 , grossly normal neurologically.  Skin: Warm and dry, no rash or jaundice.   Psych: Alert and cooperative, normal mood and affect.  Labs: Lab Results  Component  Value Date   WBC 12.3* 03/14/2015   HGB 14.7 03/14/2015   HCT 44.4 03/14/2015   MCV 94.1 03/14/2015   PLT 295 03/14/2015   Lab Results  Component Value Date   CREATININE 1.17 03/14/2015   BUN 19 03/14/2015   NA 141 03/14/2015   K 4.6 03/14/2015     CL 109 03/14/2015   CO2 27 03/14/2015   Lab Results  Component Value Date   ALT 19 03/14/2015   AST 23 03/14/2015   ALKPHOS 52 03/14/2015   BILITOT 1.0 03/14/2015   Lab Results  Component Value Date   CEA 1.5 03/14/2015   Lab Results  Component Value Date   FERRITIN 80 03/14/2015   Lab Results  Component Value Date   FOLATE >20.0 12/13/2014      Imaging Studies: No results found.  Impression/plan 80-year-old gentleman with history of stage III colon cancer status post resection in 2013 who presents with recurrent iron deficiency anemia. Requiring iron infusions 2 in the past 4 months. Hemoglobin has been stable. Has been diagnosed with CLL this spring. Hemoccults status unknown. Generally patient has been feeling well from a GI standpoint although his PPI therapy was increased about a year ago. Unclear whether he requires double dose therapy at this point. This may be contributing to his iron deficiency. Lengthy discussion with the patient today. Would be reasonable given his history of 3 separate malignancies, extensive family history of malignancies, ongoing iron deficiency to offer him a colonoscopy and upper endoscopy for further evaluation.  I have discussed the risks, alternatives, benefits with regards to but not limited to the risk of reaction to medication, bleeding, infection, perforation and the patient is agreeable to proceed. Written consent to be obtained.  Patient request prep he received last time which appeared be adequate (Prepopik). Encouraged adequate hydration prior to prep and during bowel preparation. Split dose prep will be used.  

## 2015-05-09 NOTE — Interval H&P Note (Signed)
History and Physical Interval Note:  05/09/2015 10:59 AM  David Pugh  has presented today for surgery, with the diagnosis of IDA/GERD/HISTORY OF COLON CANCER  The various methods of treatment have been discussed with the patient and family. After consideration of risks, benefits and other options for treatment, the patient has consented to  Procedure(s) with comments: COLONOSCOPY (N/A) - 1200-moved to [address]5 [address] to notify pt ESOPHAGOGASTRODUODENOSCOPY (EGD) (N/A) as a surgical intervention .  The patient's history has been reviewed, patient examined, no change in status, stable for surgery.  I have reviewed the patient's chart and labs.  Questions were answered to the patient's satisfaction.     Fe Okubo  No change. No documented GI bleeding. EGD and colonoscopy per plan.  The risks, benefits, limitations, imponderables and alternatives regarding both EGD and colonoscopy have been reviewed with the patient. Questions have been answered. All parties agreeable.

## 2015-05-14 ENCOUNTER — Encounter (HOSPITAL_COMMUNITY): Payer: Self-pay | Admitting: Internal Medicine

## 2015-05-19 ENCOUNTER — Other Ambulatory Visit: Payer: Self-pay

## 2015-05-20 ENCOUNTER — Other Ambulatory Visit: Payer: Self-pay

## 2015-05-20 DIAGNOSIS — E611 Iron deficiency: Secondary | ICD-10-CM

## 2015-05-22 ENCOUNTER — Other Ambulatory Visit (HOSPITAL_COMMUNITY)
Admission: RE | Admit: 2015-05-22 | Discharge: 2015-05-22 | Disposition: A | Discharge status: Home / Self Care | Payer: Medicare Other | Source: Ambulatory Visit | Attending: Oncology | Admitting: Oncology

## 2015-05-22 DIAGNOSIS — C182 Malignant neoplasm of ascending colon: Secondary | ICD-10-CM | POA: Insufficient documentation

## 2015-05-28 ENCOUNTER — Other Ambulatory Visit: Payer: Self-pay | Admitting: Physician Assistant

## 2015-05-28 NOTE — Telephone Encounter (Signed)
Please review for refill. THanks!! 

## 2015-05-30 ENCOUNTER — Encounter (HOSPITAL_COMMUNITY): Payer: Self-pay

## 2015-06-03 ENCOUNTER — Encounter (HOSPITAL_COMMUNITY)
Admission: RE | Disposition: A | Discharge status: Home / Self Care | Payer: Self-pay | Source: Ambulatory Visit | Attending: Internal Medicine

## 2015-06-03 ENCOUNTER — Ambulatory Visit (HOSPITAL_COMMUNITY)
Admission: RE | Admit: 2015-06-03 | Discharge: 2015-06-03 | Disposition: A | Discharge status: Home / Self Care | Payer: Medicare Other | Source: Ambulatory Visit | Attending: Internal Medicine | Admitting: Internal Medicine

## 2015-06-03 DIAGNOSIS — Z923 Personal history of irradiation: Secondary | ICD-10-CM | POA: Insufficient documentation

## 2015-06-03 DIAGNOSIS — K2270 Barrett's esophagus without dysplasia: Secondary | ICD-10-CM | POA: Diagnosis not present

## 2015-06-03 DIAGNOSIS — Z7982 Long term (current) use of aspirin: Secondary | ICD-10-CM | POA: Diagnosis not present

## 2015-06-03 DIAGNOSIS — Z9049 Acquired absence of other specified parts of digestive tract: Secondary | ICD-10-CM | POA: Diagnosis not present

## 2015-06-03 DIAGNOSIS — D509 Iron deficiency anemia, unspecified: Secondary | ICD-10-CM

## 2015-06-03 DIAGNOSIS — Z85038 Personal history of other malignant neoplasm of large intestine: Secondary | ICD-10-CM | POA: Diagnosis not present

## 2015-06-03 DIAGNOSIS — Z7902 Long term (current) use of antithrombotics/antiplatelets: Secondary | ICD-10-CM | POA: Insufficient documentation

## 2015-06-03 DIAGNOSIS — K210 Gastro-esophageal reflux disease with esophagitis: Secondary | ICD-10-CM | POA: Diagnosis not present

## 2015-06-03 HISTORY — PX: GIVENS CAPSULE STUDY: SHX5432

## 2015-06-03 SURGERY — IMAGING PROCEDURE, GI TRACT, INTRALUMINAL, VIA CAPSULE
Anesthesia: LOCAL

## 2015-06-03 MED ORDER — SIMETHICONE 40 MG/0.6ML PO SUSP
ORAL | Status: AC
  Filled 2015-06-03: qty 0.6

## 2015-06-04 ENCOUNTER — Encounter (HOSPITAL_COMMUNITY): Payer: Self-pay | Admitting: Internal Medicine

## 2015-06-04 ENCOUNTER — Other Ambulatory Visit: Payer: Self-pay | Admitting: Physician Assistant

## 2015-06-04 NOTE — Telephone Encounter (Signed)
Please review for refill, Thank you. 

## 2015-06-04 NOTE — Telephone Encounter (Signed)
Rx(s) sent to pharmacy electronically.  

## 2015-06-08 ENCOUNTER — Telehealth: Payer: Self-pay | Admitting: Internal Medicine

## 2015-06-08 NOTE — Telephone Encounter (Signed)
I have reviewed pertinent capsule images/impression and recent endoscopic evaluation with the patient via telephone last evening. Capsule images reveal nonspecific abnormalities of the small bowel mucosa. Likely trivial antral erosions. No obvious tumor or overt bleeding lesions seen. The etiology of patient's iron deficiency anemia may be multifactorial (i.e. high-dose acid suppression therapy, slow ooze from the GI tract related to Plavix and aspirin);  I doubt significant contributing factor from radiation-induced proctitis. No evidence of overt recurrent tumor anywhere. Keeping in mind he has a history of stage III colon cancer and his last CT scan was one year ago, I recommend we complete the GI evaluation with CT enterography via cross-sectional imaging.  Patient agreeable to the plan. Periodic assessment of his anemia being done by oncology.

## 2015-06-09 ENCOUNTER — Other Ambulatory Visit: Payer: Self-pay

## 2015-06-09 DIAGNOSIS — D509 Iron deficiency anemia, unspecified: Secondary | ICD-10-CM

## 2015-06-09 DIAGNOSIS — Z85038 Personal history of other malignant neoplasm of large intestine: Secondary | ICD-10-CM

## 2015-06-09 NOTE — Telephone Encounter (Signed)
Pt is set up for CT on 06/13/15 @ 0915 and he is aware. He PA # [id number]

## 2015-06-11 ENCOUNTER — Other Ambulatory Visit: Payer: Self-pay | Admitting: Physician Assistant

## 2015-06-11 NOTE — Telephone Encounter (Signed)
Rx(s) sent to pharmacy electronically.  

## 2015-06-11 NOTE — Telephone Encounter (Signed)
Please review for refill, Thank you. 

## 2015-06-12 NOTE — Op Note (Signed)
Small Bowel Givens Capsule Study Procedure date:  06/03/15   Referring Provider:  Dr. Gala Romney PCP:  Dr. [name], MD  Indication for procedure:  IDA  Patient data:  Wt: 228 lb (103.42 kg) Ht: 5\' 11"  (1.803 m) Waist: N/A  Findings:  Non-specific findings. Possible erosions at 00:41:30 and 00:45:07; Throughout the study there is what appears to be flecks of blood versus debris, multiple captures were taken but the worst is likely 01:51:16. Finally, there's an off circumferential discoloration at 01:51:32 or questionable significance.   Overall, no active bleeding, tumor, gross lesion, or other etiology to explain IDA was noted.  First Gastric image:  00:00:07 First Duodenal image: 00:45:08 First Ileo-Cecal Valve image: N/A First Cecal image: 02:52:37 Gastric Passage time: 00h 53m Small Bowel Passage time:  02h 40m   CT abdomen/pelvis w/ contrast 06/11/14: IMPRESSION: 1. No findings to suggest recurrent or metastatic disease in the abdomen or pelvis. 2. Tiny umbilical hernia containing a short segment of small bowel, without evidence of incarceration or obstruction at this time. 3. Colonic diverticulosis without findings to suggest acute diverticulitis at this time. 4. Atherosclerosis, including right coronary artery disease. 5. Additional incidental findings, as above, similar to prior examinations.   Summary & Recommendations: Nonspecific findings as noted above, no clear etiology for continued IDA. IDA likely multifactorial such as slow ooze in seting of Plavix and aspirin. He does have a history of stage III colon cancer and last CT 1 year ago with impression as noted above. Discussed with Dr. Gala Romney and will proceed with CT enterography to complete his GI workup.   David Pugh, AGNP-C Adult & Gerontological Nurse Practitioner [facility]

## 2015-06-13 ENCOUNTER — Ambulatory Visit (HOSPITAL_COMMUNITY)
Admission: RE | Admit: 2015-06-13 | Discharge: 2015-06-13 | Disposition: A | Discharge status: Home / Self Care | Payer: Medicare Other | Source: Ambulatory Visit | Attending: Internal Medicine | Admitting: Internal Medicine

## 2015-06-13 DIAGNOSIS — D509 Iron deficiency anemia, unspecified: Secondary | ICD-10-CM

## 2015-06-13 DIAGNOSIS — Z9049 Acquired absence of other specified parts of digestive tract: Secondary | ICD-10-CM | POA: Insufficient documentation

## 2015-06-13 DIAGNOSIS — Z8546 Personal history of malignant neoplasm of prostate: Secondary | ICD-10-CM | POA: Diagnosis not present

## 2015-06-13 DIAGNOSIS — Z85038 Personal history of other malignant neoplasm of large intestine: Secondary | ICD-10-CM | POA: Diagnosis not present

## 2015-06-13 DIAGNOSIS — C9110 Chronic lymphocytic leukemia of B-cell type not having achieved remission: Secondary | ICD-10-CM | POA: Diagnosis not present

## 2015-06-13 LAB — POCT I-STAT CREATININE: CREATININE: 1.2 mg/dL (ref 0.61–1.24)

## 2015-06-13 MED ORDER — BARIUM SULFATE 0.1 % PO SUSP: 450.0000 mL | Freq: Once | ORAL | Status: DC

## 2015-06-13 MED ORDER — BARIUM SULFATE 0.1 % PO SUSP
ORAL | Status: AC
  Filled 2015-06-13: qty 3

## 2015-06-13 MED ORDER — IOHEXOL 300 MG/ML  SOLN
150.0000 mL | Freq: Once | INTRAMUSCULAR | Status: AC | PRN
  Administered 2015-06-13: 150 mL via INTRAVENOUS

## 2015-07-11 ENCOUNTER — Encounter (HOSPITAL_COMMUNITY): Payer: Medicare Other | Attending: Cardiovascular Disease

## 2015-07-11 DIAGNOSIS — E611 Iron deficiency: Secondary | ICD-10-CM

## 2015-07-11 DIAGNOSIS — Z9861 Coronary angioplasty status: Secondary | ICD-10-CM | POA: Diagnosis not present

## 2015-07-11 DIAGNOSIS — C182 Malignant neoplasm of ascending colon: Secondary | ICD-10-CM

## 2015-07-11 DIAGNOSIS — Z5189 Encounter for other specified aftercare: Secondary | ICD-10-CM | POA: Insufficient documentation

## 2015-07-11 LAB — CBC WITH DIFFERENTIAL/PLATELET
BASOS PCT: 1 %
Basophils Absolute: 0.1 10*3/uL (ref 0.0–0.1)
EOS ABS: 0.8 10*3/uL — AB (ref 0.0–0.7)
Eosinophils Relative: 6 %
HEMATOCRIT: 45.1 % (ref 39.0–52.0)
HEMOGLOBIN: 14.9 g/dL (ref 13.0–17.0)
Lymphocytes Relative: 41 %
Lymphs Abs: 5.1 10*3/uL — ABNORMAL HIGH (ref 0.7–4.0)
MCH: 31.2 pg (ref 26.0–34.0)
MCHC: 33 g/dL (ref 30.0–36.0)
MCV: 94.4 fL (ref 78.0–100.0)
MONOS PCT: 9 %
Monocytes Absolute: 1.1 10*3/uL — ABNORMAL HIGH (ref 0.1–1.0)
NEUTROS ABS: 5.6 10*3/uL (ref 1.7–7.7)
NEUTROS PCT: 44 %
PLATELETS: 318 10*3/uL (ref 150–400)
RBC: 4.78 MIL/uL (ref 4.22–5.81)
RDW: 12.4 % (ref 11.5–15.5)
WBC: 12.7 10*3/uL — AB (ref 4.0–10.5)

## 2015-07-11 LAB — COMPREHENSIVE METABOLIC PANEL
ALBUMIN: 3.8 g/dL (ref 3.5–5.0)
ALT: 22 U/L (ref 17–63)
ANION GAP: 5 (ref 5–15)
AST: 23 U/L (ref 15–41)
Alkaline Phosphatase: 60 U/L (ref 38–126)
BUN: 17 mg/dL (ref 6–20)
CHLORIDE: 108 mmol/L (ref 101–111)
CO2: 26 mmol/L (ref 22–32)
Calcium: 8.6 mg/dL — ABNORMAL LOW (ref 8.9–10.3)
Creatinine, Ser: 1.12 mg/dL (ref 0.61–1.24)
GFR calc Af Amer: >60 mL/min (ref >60)
GFR calc non Af Amer: >60 mL/min (ref >60)
GLUCOSE: 102 mg/dL — AB (ref 65–99)
POTASSIUM: 4.2 mmol/L (ref 3.5–5.1)
SODIUM: 139 mmol/L (ref 135–145)
TOTAL PROTEIN: 6.5 g/dL (ref 6.5–8.1)
Total Bilirubin: 0.7 mg/dL (ref 0.3–1.2)

## 2015-07-11 LAB — FERRITIN: Ferritin: 89 ng/mL (ref 24–336)

## 2015-07-11 NOTE — Progress Notes (Signed)
.  David Pugh's reason for visit today is for labs as scheduled per MD orders.  Venipuncture performed with a 23 gauge butterfly needle to R Antecubital.  David Pugh tolerated procedure well and without incident; questions were answered and patient was discharged.

## 2015-07-12 LAB — CEA: CEA: 1.6 ng/mL (ref 0.0–4.7)

## 2015-07-15 ENCOUNTER — Ambulatory Visit (HOSPITAL_COMMUNITY): Payer: TRICARE For Life (TFL) | Admitting: Oncology

## 2015-07-15 ENCOUNTER — Ambulatory Visit (HOSPITAL_COMMUNITY): Payer: TRICARE For Life (TFL) | Admitting: Hematology & Oncology

## 2015-07-21 ENCOUNTER — Encounter (HOSPITAL_BASED_OUTPATIENT_CLINIC_OR_DEPARTMENT_OTHER): Payer: Medicare Other | Admitting: Hematology & Oncology

## 2015-07-21 ENCOUNTER — Encounter (HOSPITAL_COMMUNITY): Payer: Self-pay | Admitting: Hematology & Oncology

## 2015-07-21 VITALS — BP 128/73 | HR 57 | Temp 97.5°F | Resp 20 | Wt 227.0 lb

## 2015-07-21 DIAGNOSIS — C9110 Chronic lymphocytic leukemia of B-cell type not having achieved remission: Secondary | ICD-10-CM | POA: Insufficient documentation

## 2015-07-21 DIAGNOSIS — C182 Malignant neoplasm of ascending colon: Secondary | ICD-10-CM | POA: Diagnosis not present

## 2015-07-21 DIAGNOSIS — Z8546 Personal history of malignant neoplasm of prostate: Secondary | ICD-10-CM | POA: Diagnosis not present

## 2015-07-21 DIAGNOSIS — K627 Radiation proctitis: Secondary | ICD-10-CM

## 2015-07-21 DIAGNOSIS — E611 Iron deficiency: Secondary | ICD-10-CM

## 2015-07-21 NOTE — Progress Notes (Signed)
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 R hemicolectomy. Inability to tolerate adjuvant XELODA 02/29/2012 History of prostate cancer. TURP X 2, XRT at [facility] in 2011  Chronic lymphocytic leukemia, flow cytometry on 12/13/2014 with a small monoclonal B-cell population CD5+, CD23+ RAI clinical stage 0 CT C/A/P 06/13/2014 no findings to suggest recurrent or metastatic disease in the abdomen/pelvis Colonoscopy 05/09/15  CURRENT THERAPY: Observation   INTERVAL HISTORY: David Pugh 80 y.o. male with a history of CAD, Stage III CRC, prostate cancer and CLL who is doing well. He is here today for regular follow-up. He has recently been started on flomax and reports urinary frequency. He is up to date with his screening colonoscopy.    David Pugh is accompanied by his wife and here today for regular follow-up. We also discussed his recent imaging and labs. He recently had a colonoscopy on 05/09/15, as well as a pill cam.   He has been feeling good and recently started exercising this past Saturday. Stating that at first his knees limited him but they are doing better. Denies joint pain other than his knees. Denies chest pain, abdominal pain, or breathing issues.  He and his wife have living wills in place and state that they have been meaning to bring them in. He has received a flu shot this year.  Past Medical History  Diagnosis Date  . Prostate cancer [facility]) 2010    s/p radiation, surgery  . Radiation Feb 2011    treatment  . Biliary dyskinesia   . GERD (gastroesophageal reflux disease)   . Hiatal hernia 2010    tiny  . HTN (hypertension)   . Rosacea   . PTSD (post-traumatic stress disorder)   . Diverticula of colon 2010  . Colon polyps     tubular adenomas  . Arthritis   . Exposure to [employer]   . CAD (coronary artery disease) 2012    a. hx of stent x2 in ramus/OM and mid LAD in 2012  b. balloon angioplaty  of OM  . Peripheral neuropathy ([place])   . Anemia     hx of anemia as a child   . Colon cancer ([place]) 12/2011    s/p right hemicolectomy, did not tolerate chemo  . Iron deficiency 05/02/2012    followed by Dr. Tressie [name], iron infusions.   . Folic acid deficiency   . Heart palpitations 10/2012  . Gastroparesis 04/12/2013    borderline  . Myocardial infarction ([place]) 2012  . Glaucoma   . Abnormality of gait 12/18/2013  . Radiation proctitis   . Family history of prostate cancer   . Family history of stomach cancer     has PREMATURE VENTRICULAR CONTRACTIONS; GERD; Cancer of ascending colon ([place]); Iron deficiency; Hx of adenomatous colonic polyps; Folic acid deficiency; Peripheral neuropathy ([place]); CAD (coronary artery disease); HTN (hypertension); Dyslipidemia; Viral gastroenteritis; Abnormality of gait; Shingles outbreak, right anterior thoracic; Unstable angina ([place]); H/O agent Orange exposure; Radiation proctitis; H/O prostate cancer; Family history of prostate cancer; Family history of colon cancer; Family history of stomach cancer; Mucosal abnormality of esophagus; Mucosal abnormality of stomach; Multiple gastric polyps; History of colon cancer; and Diverticulosis of colon without hemorrhage on his problem list.      Cancer of ascending colon ([place])   02/29/2012 Procedure laparoscopic assisted R colectomy   03/09/2012 Miscellaneous Iron deficiency and folic acid deficiency   03/29/2012 Initial Diagnosis  Cancer of ascending colon, CEA on 01/19/2012 was WNL   08/02/2013 Procedure Ileocolonoscopy with snare polypectomy   06/11/2014 Imaging CT abdomen/pelvis with no findings to suggest metastatic disease. Diverticulosis   Social History   Social History  . Marital Status: Married    Spouse Name: N/A  . Number of Children: 3  . Years of Education: military   Occupational History  . Retired   .     Social History Main Topics  . Smoking status: Never Smoker   . Smokeless tobacco: Never Used  .  Alcohol Use: Yes     Comment: 1 cocktail/month  . Drug Use: No  . Sexual Activity: Yes    Birth Control/ Protection: None   Other Topics Concern  . None   Social History Narrative   3 tumors in [place]  Family History  Problem Relation Age of Onset  . Throat cancer Father     dx in his 31s and again in his 97s; pipe and cigar smoker  . Neuropathy Brother   . Prostate cancer Brother 29  . Melanoma Brother     dx in his 59s  . Colon cancer Neg Hx   . Cancer Paternal Uncle     smoking related cancer  . Heart attack Paternal Grandfather   . Stomach cancer Cousin 71     is allergic to shellfish allergy; sulfa antibiotics; and sulfonamide derivatives.  David Pugh does not currently have medications on file.  Past Surgical History  Procedure Laterality Date  . Cholecystectomy    . Prostatectomy    . Tonsillectomy    . Transurethral resection of prostate    . Esophagogastroduodenoscopy  10/10/2008    Dr. Tilda Burrow hiatal hernia, normal esophagus, normal stomach  . Colonoscopy  02/12/2004    Dr. Gala Romney- L side diverticula, inflammatory  colon polyps  . Esophagogastroduodenoscopy  02/12/2004    Dr. Dudley Major- normal   . Coronary stent placement  01/2011    2.5x74mm Xience drug-eluting stent in ramus intermedius/OMI vessel, 2.5x98mm Promus Element stent in mid LAD artery  . 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 Multiple colonic  polyps removed as described above. Sigmoid Diverticulosis/ 1.5 x 2 cm relatively flat ulcerated lesion in cecum/path showed adenocarcinoma of the colon. descending colon polyp with tubular adenoma and one fragment of focal high grade dysplasia.   . Knee surgery      left knee   . Partial colectomy  02/29/2012    Procedure: PARTIAL COLECTOMY;  Surgeon: Adin Hector, MD;  Location: [facility] ORS;  Service: General;  Laterality: Right;  . Vasectomy    . Colonoscopy  08/28/2012     [id number] proctitis. Colonic diverticulosis/Ulcerations at the surgical anastomosis  path: ulcerated colonic mucosa with prolapse changes, tubular adenoma.   . Cardiac catheterization  02/13/2011    [facility], [facility] cardiology  . Cataract extraction      2011  . Colonoscopy N/A 08/02/2013    RMR: Colonic polyps -removed as described above. Colonic diverticulosis. Status post reight hemicolectomy. Radiation proctitis- asymptomatic.   . Cataract extraction w/phaco Right 08/06/2013    Procedure: CATARACT EXTRACTION PHACO AND INTRAOCULAR LENS PLACEMENT (IOC);  Surgeon: Tonny Branch, MD;  Location: [facility] ORS;  Service: Ophthalmology;  Laterality: Right;  CDE:20.96  . Coronary angioplasty  06/17/14    OM1 POBA  . Left heart catheterization with coronary angiogram N/A 06/17/2014    Procedure: LEFT HEART CATHETERIZATION WITH CORONARY ANGIOGRAM;  Surgeon: Burnell Blanks, MD;  Location: [facility] CATH LAB;  Service: Cardiovascular;  Laterality: N/A;  . Colonoscopy N/A 05/09/2015    Procedure: COLONOSCOPY;  Surgeon: Daneil Dolin, MD;  Location: [facility] END[facility] SUITE;  Service: Endoscopy;  Laterality: N/A;  1200-moved to [address]5 [address] to notify pt  . Esophagogastroduodenoscopy N/A 05/09/2015    Procedure: ESOPHAGOGASTRODUODENOSCOPY (EGD);  Surgeon: Daneil Dolin, MD;  Location: [facility];  Service: Endoscopy;  Laterality: N/A;  . Givens capsule study N/A 06/03/2015    Procedure: GIVENS CAPSULE STUDY;  Surgeon: Daneil Dolin, MD;  Location: [facility];  Service: Endoscopy;  Laterality: N/A;  0700    Review of Systems  Constitutional: Negative.   HENT: Negative.   Eyes: Negative.   Respiratory: Negative.   Cardiovascular: Negative.   Gastrointestinal: Negative.   Genitourinary:  Negative for dysuria, hematuria and flank pain.  Musculoskeletal: Positive for joint pain.      Knee pain and weakness Has OA in both knees  Skin: Negative.   Neurological: Negative for dizziness, tremors, sensory change, speech  change, focal weakness and seizures.  Endo/Heme/Allergies: Negative.   Psychiatric/Behavioral: Negative.   14 point review of systems was performed and is negative except as detailed under history of present illness and above  PHYSICAL EXAMINATION ECOG PERFORMANCE STATUS: 0 - Asymptomatic  Filed Vitals:   07/21/15 1000  BP: 128/73  Pulse: 57  Temp: 97.5 F (36.4 C)  Resp: 20   Physical Exam  Constitutional: He is oriented to person, place, and time and well-developed, well-nourished, and in no distress. No distress.  HENT:  Head: Normocephalic.  Mouth/Throat: Oropharynx is clear and moist. No oropharyngeal exudate.  Eyes: Conjunctivae and EOM are normal. Pupils are equal, round, and reactive to light. No scleral icterus.  Neck: Normal range of motion. Neck supple. No JVD present. No tracheal deviation present. No thyromegaly present.  Cardiovascular: Normal rate, regular rhythm and normal heart sounds.   No murmur heard. Pulmonary/Chest: Effort normal and breath sounds normal. No respiratory distress. He has no wheezes. He has no rales. He exhibits no tenderness.  Abdominal: Soft. Bowel sounds are normal. He exhibits no distension and no mass. There is no tenderness. There is no rebound and no guarding.  Musculoskeletal: Normal range of motion. He exhibits no edema.  Lymphadenopathy:    He has no cervical adenopathy.  Neurological: He is alert and oriented to person, place, and time. No cranial nerve deficit. Coordination normal.  Skin: Skin is warm and dry. He is not diaphoretic.  Psychiatric: Mood, memory, affect and judgment normal.    LABORATORY DATA: I have reviewed the data as listed. CBC    Component Value Date/Time   WBC 12.7* 07/11/2015 1300   RBC 4.78 07/11/2015 1300   HGB 14.9 07/11/2015 1300   HCT 45.1 07/11/2015 1300   PLT 318 07/11/2015 1300   MCV 94.4 07/11/2015 1300   MCH 31.2 07/11/2015 1300   MCHC 33.0 07/11/2015 1300   RDW 12.4 07/11/2015 1300    LYMPHSABS 5.1* 07/11/2015 1300   MONOABS 1.1* 07/11/2015 1300   EOSABS 0.8* 07/11/2015 1300   BASOSABS 0.1 07/11/2015 1300    CMP     Component Value Date/Time   NA 139 07/11/2015 1300   K 4.2 07/11/2015 1300   CL 108 07/11/2015 1300   CO2 26 07/11/2015 1300   GLUCOSE 102* 07/11/2015 1300   BUN 17 07/11/2015 1300   CREATININE 1.12 07/11/2015 1300   CALCIUM 8.6* 07/11/2015  1300   PROT 6.5 07/11/2015 1300   PROT 6.4 12/18/2013 0941   ALBUMIN 3.8 07/11/2015 1300   AST 23 07/11/2015 1300   ALT 22 07/11/2015 1300   ALKPHOS 60 07/11/2015 1300   BILITOT 0.7 07/11/2015 1300   GFRNONAA >60 07/11/2015 1300   GFRAA >60 07/11/2015 1300   RADIOGRAPHY: I have personally reviewed the radiological images as listed and agreed with the findings in the report.  CLINICAL DATA: History of colon cancer and prostate cancer. Chronic lymphocytic leukemia. Iron deficiency anemia.  EXAM: CT ABDOMEN AND PELVIS WITH CONTRAST (ENTEROGRAPHY)  TECHNIQUE: Multidetector CT of the abdomen and pelvis during bolus administration of intravenous contrast. Negative oral contrast VoLumen was given.  CONTRAST: 152mL OMNIPAQUE IOHEXOL 300 MG/ML SOLN  COMPARISON: CT scan 06/11/2014  FINDINGS: Lower chest: The lung bases are clear except for minimal areas of streaky atelectasis and right basilar scarring changes. No pulmonary nodules or pleural effusion. The heart is normal in size. No pericardial effusion.  Hepatobiliary: Mild diffuse fatty infiltration of the liver but no focal hepatic lesions or intrahepatic biliary dilatation. Small cysts noted in the right hepatic lobe inferiorly. The gallbladder is surgically absent. No common bile duct dilatation.  Pancreas: No mass, inflammation or ductal dilatation.  Spleen: Normal size. No focal lesions.  Adrenals/Urinary Tract: The adrenal glands are normal.  No significant renal abnormalities. A small left renal cyst is noted. There are  mild renal cortical scarring changes. No renal or obstructing ureteral calculi. No bladder calculi. No renal or bladder mass.  Stomach/Bowel: The stomach, duodenum and small bowel are unremarkable. No findings to suggest inflammatory bowel disease or small bowel mass. No obstructive findings. Surgical changes from a partial right colectomy. No colonic mass or obstruction. Scattered colonic diverticulosis without findings for acute diverticulitis.  Vascular/Lymphatic: No mesenteric or retroperitoneal mass or adenopathy. The aorta and branch vessels are patent. Moderate scattered atherosclerotic calcifications. The major venous structures are patent.  Other: The bladder, prostate gland and seminal vesicles are unremarkable. Brachytherapy seeds are noted in the prostate gland. No pelvic mass or adenopathy. No free pelvic fluid collections. No inguinal mass or adenopathy.  Musculoskeletal: No significant bony findings. No findings to suggest osseous metastatic disease.  IMPRESSION: 1. No acute abdominal/pelvic findings, mass lesions or adenopathy. 2. Status post partial right colectomy. No findings for recurrent tumor or metastatic disease. 3. Brachytherapy seeds noted in the prostate gland. No pelvic or retroperitoneal adenopathy or worrisome bone lesions. 4. No findings for small bowel inflammatory process, mass or obstruction.   Electronically Signed  By: Marijo Sanes M.D.  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 R hemicolectomy. Inability to tolerate adjuvant XELODA 02/29/2012 History of prostate cancer. TURP X 2, XRT at [facility] in 2011  Chronic lymphocytic leukemia, flow cytometry on 12/13/2014 with a small monoclonal B-cell population CD5+, CD23+ RAI clinical stage 0  He is clinically doing well. I answered his questions about potential causes of iron deficiency including GI related blood loss and iron malabsorption. I also  advised him that times patients do not get adequate iron intake. He did receive a dose of IV iron with excellent tolerance and reported an improvement in his energy. Advised the patient we will call with his CEA level when it is available. He is currently up-to-date on all of his well care.  He here to discuss his recent CBC. We also discussed his recent imaging and labs. I have advised that it  is his choice on whether he'd like to receive an iron infusion today. Goal is to maintain serum ferritin of 100 ng/ml. He has been holding in the 80 ng/ml range.   We will repeat labs in 2 months. He will return for routine follow-up in 4 months. At next follow-up, we will consider extending follow-ups to every 6 months if his labs continue to be stable.  This document serves as a record of services personally performed by Ancil Linsey, MD. It was created on her behalf by Arlyce Harman, a trained medical scribe. The creation of this record is based on the scribe's personal observations and the provider's statements to them. This document has been checked and approved by the attending provider.  This note was electronically signed.  I have reviewed the above documentation for accuracy and completeness and I agree with the above.  Kelby Fam. Whitney Muse, MD

## 2015-07-21 NOTE — Patient Instructions (Addendum)
[place] at [facility] Discharge Instructions  RECOMMENDATIONS MADE BY THE CONSULTANT AND ANY TEST RESULTS WILL BE SENT TO YOUR REFERRING PHYSICIAN.  Exam completed by Dr Whitney Muse today Return in 2 months to have your iron level checked. Return to see the doctor in 4 months to see the doctor and to have labs checked. Please call the clinic if you have any questions or concerns.  Thank you for choosing [place] at [facility] to provide your oncology and hematology care.  To afford each patient quality time with our provider, please arrive at least 15 minutes before your scheduled appointment time.    You need to re-schedule your appointment should you arrive 10 or more minutes late.  We strive to give you quality time with our providers, and arriving late affects you and other patients whose appointments are after yours.  Also, if you no show three or more times for appointments you may be dismissed from the clinic at the providers discretion.     Again, thank you for choosing [facility].  Our hope is that these requests will decrease the amount of time that you wait before being seen by our physicians.       _____________________________________________________________  Should you have questions after your visit to [facility], please contact our office at [phone] between the hours of 8:30 a.m. and 4:30 p.m.  Voicemails left after 4:30 p.m. will not be returned until the following business day.  For prescription refill requests, have your pharmacy contact our office.

## 2015-08-27 ENCOUNTER — Emergency Department (HOSPITAL_COMMUNITY): Payer: Medicare Other

## 2015-08-27 ENCOUNTER — Emergency Department (HOSPITAL_COMMUNITY)
Admission: EM | Admit: 2015-08-27 | Discharge: 2015-08-27 | Disposition: A | Discharge status: Home / Self Care | Payer: Medicare Other | Attending: Emergency Medicine | Admitting: Emergency Medicine

## 2015-08-27 ENCOUNTER — Encounter (HOSPITAL_COMMUNITY): Payer: Self-pay | Admitting: Emergency Medicine

## 2015-08-27 DIAGNOSIS — Z85038 Personal history of other malignant neoplasm of large intestine: Secondary | ICD-10-CM | POA: Insufficient documentation

## 2015-08-27 DIAGNOSIS — R109 Unspecified abdominal pain: Secondary | ICD-10-CM | POA: Insufficient documentation

## 2015-08-27 DIAGNOSIS — R531 Weakness: Secondary | ICD-10-CM | POA: Diagnosis not present

## 2015-08-27 DIAGNOSIS — Z8659 Personal history of other mental and behavioral disorders: Secondary | ICD-10-CM | POA: Diagnosis not present

## 2015-08-27 DIAGNOSIS — Z7982 Long term (current) use of aspirin: Secondary | ICD-10-CM | POA: Diagnosis not present

## 2015-08-27 DIAGNOSIS — K219 Gastro-esophageal reflux disease without esophagitis: Secondary | ICD-10-CM | POA: Diagnosis not present

## 2015-08-27 DIAGNOSIS — L719 Rosacea, unspecified: Secondary | ICD-10-CM | POA: Diagnosis not present

## 2015-08-27 DIAGNOSIS — R630 Anorexia: Secondary | ICD-10-CM | POA: Insufficient documentation

## 2015-08-27 DIAGNOSIS — R112 Nausea with vomiting, unspecified: Secondary | ICD-10-CM | POA: Diagnosis not present

## 2015-08-27 DIAGNOSIS — R197 Diarrhea, unspecified: Secondary | ICD-10-CM | POA: Diagnosis not present

## 2015-08-27 DIAGNOSIS — Z79899 Other long term (current) drug therapy: Secondary | ICD-10-CM | POA: Insufficient documentation

## 2015-08-27 DIAGNOSIS — R5383 Other fatigue: Secondary | ICD-10-CM | POA: Insufficient documentation

## 2015-08-27 DIAGNOSIS — D529 Folate deficiency anemia, unspecified: Secondary | ICD-10-CM | POA: Insufficient documentation

## 2015-08-27 DIAGNOSIS — I2510 Atherosclerotic heart disease of native coronary artery without angina pectoris: Secondary | ICD-10-CM | POA: Diagnosis not present

## 2015-08-27 DIAGNOSIS — I252 Old myocardial infarction: Secondary | ICD-10-CM | POA: Insufficient documentation

## 2015-08-27 DIAGNOSIS — Z8546 Personal history of malignant neoplasm of prostate: Secondary | ICD-10-CM | POA: Insufficient documentation

## 2015-08-27 DIAGNOSIS — Z955 Presence of coronary angioplasty implant and graft: Secondary | ICD-10-CM | POA: Insufficient documentation

## 2015-08-27 DIAGNOSIS — Z86010 Personal history of colonic polyps: Secondary | ICD-10-CM | POA: Insufficient documentation

## 2015-08-27 DIAGNOSIS — I10 Essential (primary) hypertension: Secondary | ICD-10-CM | POA: Diagnosis not present

## 2015-08-27 DIAGNOSIS — M1990 Unspecified osteoarthritis, unspecified site: Secondary | ICD-10-CM | POA: Insufficient documentation

## 2015-08-27 DIAGNOSIS — Z7901 Long term (current) use of anticoagulants: Secondary | ICD-10-CM | POA: Insufficient documentation

## 2015-08-27 DIAGNOSIS — E860 Dehydration: Secondary | ICD-10-CM

## 2015-08-27 DIAGNOSIS — H409 Unspecified glaucoma: Secondary | ICD-10-CM | POA: Diagnosis not present

## 2015-08-27 DIAGNOSIS — Z923 Personal history of irradiation: Secondary | ICD-10-CM | POA: Diagnosis not present

## 2015-08-27 LAB — URINALYSIS, ROUTINE W REFLEX MICROSCOPIC
Glucose, UA: NEGATIVE mg/dL
LEUKOCYTES UA: NEGATIVE
NITRITE: NEGATIVE
PH: 5.5 (ref 5.0–8.0)
Protein, ur: NEGATIVE mg/dL
Specific Gravity, Urine: >1.03 — ABNORMAL HIGH (ref 1.005–1.030)

## 2015-08-27 LAB — BASIC METABOLIC PANEL
Anion gap: 8 (ref 5–15)
BUN: 14 mg/dL (ref 6–20)
CO2: 24 mmol/L (ref 22–32)
Calcium: 8.6 mg/dL — ABNORMAL LOW (ref 8.9–10.3)
Chloride: 106 mmol/L (ref 101–111)
Creatinine, Ser: 1.3 mg/dL — ABNORMAL HIGH (ref 0.61–1.24)
GFR calc Af Amer: 59 mL/min — ABNORMAL LOW (ref >60)
GFR, EST NON AFRICAN AMERICAN: 51 mL/min — AB (ref >60)
GLUCOSE: 96 mg/dL (ref 65–99)
POTASSIUM: 3.9 mmol/L (ref 3.5–5.1)
Sodium: 138 mmol/L (ref 135–145)

## 2015-08-27 LAB — CBC WITH DIFFERENTIAL/PLATELET
Basophils Absolute: 0 10*3/uL (ref 0.0–0.1)
Basophils Relative: 0 %
EOS PCT: 3 %
Eosinophils Absolute: 0.3 10*3/uL (ref 0.0–0.7)
HCT: 49.8 % (ref 39.0–52.0)
Hemoglobin: 17 g/dL (ref 13.0–17.0)
LYMPHS ABS: 2.9 10*3/uL (ref 0.7–4.0)
LYMPHS PCT: 25 %
MCH: 31.8 pg (ref 26.0–34.0)
MCHC: 34.1 g/dL (ref 30.0–36.0)
MCV: 93.3 fL (ref 78.0–100.0)
MONO ABS: 1.6 10*3/uL — AB (ref 0.1–1.0)
MONOS PCT: 14 %
Neutro Abs: 6.7 10*3/uL (ref 1.7–7.7)
Neutrophils Relative %: 58 %
PLATELETS: 283 10*3/uL (ref 150–400)
RBC: 5.34 MIL/uL (ref 4.22–5.81)
RDW: 12.6 % (ref 11.5–15.5)
WBC: 11.6 10*3/uL — AB (ref 4.0–10.5)

## 2015-08-27 LAB — LIPASE, BLOOD: Lipase: 26 U/L (ref 11–51)

## 2015-08-27 LAB — HEPATIC FUNCTION PANEL
ALBUMIN: 3.8 g/dL (ref 3.5–5.0)
ALT: 34 U/L (ref 17–63)
AST: 35 U/L (ref 15–41)
Alkaline Phosphatase: 72 U/L (ref 38–126)
Bilirubin, Direct: 0.2 mg/dL (ref 0.1–0.5)
Indirect Bilirubin: 0.4 mg/dL (ref 0.3–0.9)
TOTAL PROTEIN: 7.2 g/dL (ref 6.5–8.1)
Total Bilirubin: 0.6 mg/dL (ref 0.3–1.2)

## 2015-08-27 LAB — URINE MICROSCOPIC-ADD ON
BACTERIA UA: NONE SEEN
RBC / HPF: NONE SEEN RBC/hpf (ref 0–5)
SQUAMOUS EPITHELIAL / LPF: NONE SEEN

## 2015-08-27 MED ORDER — ONDANSETRON HCL 4 MG PO TABS: 4.0000 mg | ORAL_TABLET | Freq: Four times a day (QID) | ORAL | Status: DC

## 2015-08-27 MED ORDER — SODIUM CHLORIDE 0.9 % IV BOLUS (SEPSIS)
1000.0000 mL | Freq: Once | INTRAVENOUS | Status: AC
  Administered 2015-08-27: 1000 mL via INTRAVENOUS

## 2015-08-27 MED ORDER — ONDANSETRON HCL 4 MG/2ML IJ SOLN
4.0000 mg | Freq: Once | INTRAMUSCULAR | Status: AC
  Administered 2015-08-27: 4 mg via INTRAVENOUS
  Filled 2015-08-27: qty 2

## 2015-08-27 MED ORDER — SODIUM CHLORIDE 0.9 % IV BOLUS (SEPSIS): 1000.0000 mL | Freq: Once | INTRAVENOUS | Status: DC

## 2015-08-27 NOTE — ED Provider Notes (Signed)
CSN: [id number]     Arrival date & time 08/27/15  1119 History   First MD Initiated Contact with Patient 08/27/15 1329     Chief Complaint  Patient presents with  . Emesis  . Diarrhea     (Consider location/radiation/quality/duration/timing/severity/associated sxs/prior Treatment) HPI Comments: Patient states his PCP sent him in for dehydration. Patient states he's had frequent episodes of diarrhea for the past 3 days. States he is going "every 15 minutes". It is a small amount that is nonbloody. He's had 2 episodes of vomiting 2 days ago and 2 episodes of vomiting today. He denies any fever. He's had some abdominal cramping but no significant abdominal pain. Patient has remote history of prostate cancer and colon cancer status post resection. He is currently not getting any chemotherapy. He did have sick contacts at home as his son had similar symptoms. Denies any dysuria hematuria. No recent travel or antibiotic use. No chest pain or shortness of breath.  The history is provided by the patient.    Past Medical History  Diagnosis Date  . Prostate cancer [facility]) 2010    s/p radiation, surgery  . Radiation Feb 2011    treatment  . Biliary dyskinesia   . GERD (gastroesophageal reflux disease)   . Hiatal hernia 2010    tiny  . HTN (hypertension)   . Rosacea   . PTSD (post-traumatic stress disorder)   . Diverticula of colon 2010  . Colon polyps     tubular adenomas  . Arthritis   . Exposure to [employer]   . CAD (coronary artery disease) 2012    a. hx of stent x2 in ramus/OM and mid LAD in 2012  b. balloon angioplaty of OM  . Peripheral neuropathy ([place])   . Anemia     hx of anemia as a child   . Colon cancer ([place]) 12/2011    s/p right hemicolectomy, did not tolerate chemo  . Iron deficiency 05/02/2012    followed by Dr. Tressie [name], iron infusions.   . Folic acid deficiency   . Heart palpitations 10/2012  . Gastroparesis 04/12/2013    borderline  . Myocardial infarction ([place])  2012  . Glaucoma   . Abnormality of gait 12/18/2013  . Radiation proctitis   . Family history of prostate cancer   . Family history of stomach cancer    Past Surgical History  Procedure Laterality Date  . Cholecystectomy    . Prostatectomy    . Tonsillectomy    . Transurethral resection of prostate    . Esophagogastroduodenoscopy  10/10/2008    Dr. Tilda Burrow hiatal hernia, normal esophagus, normal stomach  . Colonoscopy  02/12/2004    Dr. Gala Romney- L side diverticula, inflammatory  colon polyps  . Esophagogastroduodenoscopy  02/12/2004    Dr. Dudley Major- normal   . Coronary stent placement  01/2011    2.5x69mm Xience drug-eluting stent in ramus intermedius/OMI vessel, 2.5x33mm Promus Element stent in mid LAD artery  . 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 Multiple colonic  polyps removed as described above. Sigmoid Diverticulosis/ 1.5 x 2 cm relatively flat ulcerated lesion in cecum/path showed adenocarcinoma of the colon. descending colon polyp with tubular adenoma and one fragment of focal high grade dysplasia.   . Knee surgery      left knee   . Partial colectomy  02/29/2012    Procedure: PARTIAL COLECTOMY;  Surgeon: Adin Hector, MD;  Location: [facility] ORS;  Service: General;  Laterality: Right;  . Vasectomy    . Colonoscopy  08/28/2012    [id number] proctitis. Colonic diverticulosis/Ulcerations at the surgical anastomosis  path: ulcerated colonic mucosa with prolapse changes, tubular adenoma.   . Cardiac catheterization  02/13/2011    [facility] cardiology  . Cataract extraction      2011  . Colonoscopy N/A 08/02/2013    RMR: Colonic polyps -removed as described above. Colonic diverticulosis. Status post reight hemicolectomy. Radiation proctitis- asymptomatic.   . Cataract extraction w/phaco Right 08/06/2013    Procedure: CATARACT EXTRACTION PHACO AND INTRAOCULAR LENS PLACEMENT (IOC);  Surgeon: Tonny Branch, MD;  Location: [facility] ORS;  Service: Ophthalmology;  Laterality: Right;  CDE:20.96  . Coronary angioplasty  06/17/14    OM1 POBA  . Left heart catheterization with coronary angiogram N/A 06/17/2014    Procedure: LEFT HEART CATHETERIZATION WITH CORONARY ANGIOGRAM;  Surgeon: Burnell Blanks, MD;  Location: [facility] CATH LAB;  Service: Cardiovascular;  Laterality: N/A;  . Colonoscopy N/A 05/09/2015    Procedure: COLONOSCOPY;  Surgeon: Daneil Dolin, MD;  Location: [facility] ENDO SUITE;  Service: Endoscopy;  Laterality: N/A;  1200-moved to 1145 [place] to notify pt  . Esophagogastroduodenoscopy N/A 05/09/2015    Procedure: ESOPHAGOGASTRODUODENOSCOPY (EGD);  Surgeon: Daneil Dolin, MD;  Location: [facility] ENDO SUITE;  Service: Endoscopy;  Laterality: N/A;  . Givens capsule study N/A 06/03/2015    Procedure: GIVENS CAPSULE STUDY;  Surgeon: Daneil Dolin, MD;  Location: [facility] ENDO SUITE;  Service: Endoscopy;  Laterality: N/A;  0700   Family History  Problem Relation Age of Onset  . Throat cancer Father     dx in his 70s and again in his 67s; pipe and cigar smoker  . Neuropathy Brother   . Prostate cancer Brother 65  . Melanoma Brother     dx in his 12s  . Colon cancer Neg Hx   . Cancer Paternal Uncle     smoking related cancer  . Heart attack Paternal Grandfather   . Stomach cancer Cousin 71   Social History  Substance Use Topics  . Smoking status: Never Smoker   . Smokeless tobacco: Never Used  . Alcohol Use: Yes     Comment: 1 cocktail/month    Review of Systems  Constitutional: Positive for activity change, appetite change and fatigue. Negative for fever.  HENT: Negative for congestion.   Eyes: Negative for visual disturbance.  Respiratory: Negative for cough, chest tightness and shortness of breath.   Cardiovascular: Negative for chest pain.  Gastrointestinal: Positive for nausea, vomiting, abdominal pain and diarrhea.  Genitourinary: Negative for dysuria, hematuria and testicular pain.   Musculoskeletal: Negative for myalgias and arthralgias.  Skin: Negative for rash.  Neurological: Positive for weakness. Negative for dizziness, light-headedness and headaches.  A complete 10 system review of systems was obtained and all systems are negative except as noted in the HPI and PMH.      Allergies  Shellfish allergy; Sulfa antibiotics; and Sulfonamide derivatives  Home Medications   Prior to Admission medications   Medication Sig Start Date End Date Taking? Authorizing Provider  aspirin EC 81 MG tablet Take 81 mg by mouth daily.   Yes Historical Provider, MD  B Complex Vitamins (VITAMIN B COMPLEX PO) Take 1 tablet by mouth daily.    Yes Historical Provider, MD  camphor-menthol Timoteo Ace) lotion Apply 1 application topically as needed for itching. For knee   Yes Historical Provider, MD  cetirizine (ZYRTEC) 10 MG tablet Take  10 mg by mouth daily.   Yes Historical Provider, MD  citalopram (CELEXA) 10 MG tablet Take 10 mg by mouth every morning.    Yes Historical Provider, MD  clopidogrel (PLAVIX) 75 MG tablet Take 1 tablet (75 mg total) by mouth daily. 06/04/15  Yes Mihai Croitoru, MD  fluticasone (FLONASE) 50 MCG/ACT nasal spray Place 2 sprays into both nostrils every evening.   Yes Historical Provider, MD  folic acid (FOLVITE) 1 MG tablet TAKE 1 TABLET ONCE DAILY. Patient taking differently: taking once weekly 11/01/14  Yes Manon Hilding Kefalas, PA-C  isosorbide mononitrate (IMDUR) 30 MG 24 hr tablet TAKE (1) TABLET BY MOUTH ONCE DAILY. 06/11/15  Yes Mihai Croitoru, MD  latanoprost (XALATAN) 0.005 % ophthalmic solution Place 1 drop into both eyes at bedtime.    Yes Historical Provider, MD  loperamide (IMODIUM) 2 MG capsule Take 2 mg by mouth as needed for diarrhea or loose stools.   Yes Historical Provider, MD  losartan (COZAAR) 50 MG tablet Take 25 mg by mouth 2 (two) times daily.    Yes Historical Provider, MD  metoprolol (LOPRESSOR) 50 MG tablet Take 50 mg by mouth 2 (two) times  daily.    Yes Historical Provider, MD  Multiple Vitamin (MULTIVITAMIN WITH MINERALS) TABS tablet Take 1 tablet by mouth daily.   Yes Historical Provider, MD  NITROSTAT 0.4 MG SL tablet PLACE ONE (1) TABLET UNDER TONGUE EVERY 5 MINUTES UP TO (3) DOSES AS NEEDED FOR CHEST PAIN. 09/22/14  Yes Mihai Croitoru, MD  pantoprazole (PROTONIX) 40 MG tablet TAKE 2 TABLETS BY MOUTH ONCE DAILY. Patient taking differently: takes one tablet twice daily 09/19/14  Yes Mihai Croitoru, MD  pravastatin (PRAVACHOL) 40 MG tablet TAKE 1 TABLET BY MOUTH AT BEDTIME. 04/08/15  Yes Mihai Croitoru, MD  Probiotic Product (PROBIOTIC DAILY PO) Take 1 capsule by mouth daily.   Yes Historical Provider, MD  ondansetron (ZOFRAN) 4 MG tablet Take 1 tablet (4 mg total) by mouth every 6 (six) hours. 08/27/15   Ezequiel Essex, MD   BP 153/85 mmHg  Pulse 82  Temp(Src) 98.2 F (36.8 C) (Oral)  Resp 19  Ht 5\' 11"  (1.803 m)  Wt 222 lb (100.699 kg)  BMI 30.98 kg/m2  SpO2 98% Physical Exam  Constitutional: He is oriented to person, place, and time. He appears well-developed and well-nourished. No distress.  HENT:  Head: Normocephalic and atraumatic.  Mouth/Throat: Oropharynx is clear and moist. No oropharyngeal exudate.  Eyes: Conjunctivae and EOM are normal. Pupils are equal, round, and reactive to light.  Neck: Normal range of motion. Neck supple.  No meningismus.  Cardiovascular: Normal rate, regular rhythm, normal heart sounds and intact distal pulses.   No murmur heard. Pulmonary/Chest: Effort normal and breath sounds normal. No respiratory distress.  Abdominal: Soft. There is no tenderness. There is no rebound and no guarding.  Musculoskeletal: Normal range of motion. He exhibits no edema or tenderness.  Neurological: He is alert and oriented to person, place, and time. No cranial nerve deficit. He exhibits normal muscle tone. Coordination normal.  No ataxia on finger to nose bilaterally. No pronator drift. 5/5 strength  throughout. CN 2-12 intact.Equal grip strength. Sensation intact.   Skin: Skin is warm.  Psychiatric: He has a normal mood and affect. His behavior is normal.  Nursing note and vitals reviewed.   ED Course  Procedures (including critical care time) Labs Review Labs Reviewed  CBC WITH DIFFERENTIAL/PLATELET - Abnormal; Notable for the following:    WBC  11.6 (*)    Monocytes Absolute 1.6 (*)    All other components within normal limits  BASIC METABOLIC PANEL - Abnormal; Notable for the following:    Creatinine, Ser 1.30 (*)    Calcium 8.6 (*)    GFR calc non Af Amer 51 (*)    GFR calc Af Amer 59 (*)    All other components within normal limits  URINALYSIS, ROUTINE W REFLEX MICROSCOPIC (NOT AT [facility]) - Abnormal; Notable for the following:    Specific Gravity, Urine >1.030 (*)    Hgb urine dipstick MODERATE (*)    Bilirubin Urine SMALL (*)    Ketones, ur >80 (*)    All other components within normal limits  C DIFFICILE QUICK SCREEN W PCR REFLEX  LIPASE, BLOOD  HEPATIC FUNCTION PANEL  URINE MICROSCOPIC-ADD ON    Imaging Review Ct Renal Stone Study  08/27/2015  CLINICAL DATA:  Vomiting and diarrhea for 3 days with intermittent abdominal pain EXAM: CT ABDOMEN AND PELVIS WITHOUT CONTRAST TECHNIQUE: Multidetector CT imaging of the abdomen and pelvis was performed following the standard protocol without IV contrast. COMPARISON:  06/13/2015 FINDINGS: Lung bases show no focal infiltrate or sizable effusion. Minimal scarring is noted in the right lung base stable from the prior exam. The gallbladder is been surgically removed. The liver, spleen, adrenal glands and pancreas are within normal limits. No renal calculi or urinary tract obstructive changes are noted. Hypodensity is noted within the lower pole of the left kidney consistent with a focal cyst. Mild aortoiliac calcifications are noted without aneurysmal dilatation. Changes consistent with right colectomy are again seen. No obstructive  changes are noted. The bladder is partially distended. No free pelvic fluid is noted. No pelvic mass lesion or lymphadenopathy is noted. Minimal diverticular change is noted without evidence of diverticulitis. Mild aortoiliac calcifications are seen. The osseous structures are within normal limits. IMPRESSION: Chronic changes as described above. No significant interval change from the prior exam. No acute abnormality noted. Electronically Signed   By: Inez Catalina M.D.   On: 08/27/2015 15:06   I have personally reviewed and evaluated these images and lab results as part of my medical decision-making.   EKG Interpretation None      MDM   Final diagnoses:  Abdominal pain  Dehydration  Nausea vomiting and diarrhea  3 days of nausea, vomiting, diarrhea.  Has had sick contacts at home.  Remote history of cancer.    Urinalysis shows ketones without infection. Labs are reassuring.  Patient given IV and by mouth fluids. Abdomen soft and nontender. Denies any chest pain or shortness of breath or dizziness.  Unable to give sample for C dif testing.  CT and labs reassuring tolerating PO.  Abdomen soft.  Vitals stable, ambulatory. Suspect viral GI illness similar to what his son had.  Supportive care at home. Follow up with PCP Return precautions discussed.   Ezequiel Essex, MD 08/27/15 [phone]

## 2015-08-27 NOTE — Discharge Instructions (Signed)
Viral Gastroenteritis Keep yourself hydrated. Follow up with your doctor. Return to the ED if you develop new or worsening symptoms. Viral gastroenteritis is also known as stomach flu. This condition affects the stomach and intestinal tract. It can cause sudden diarrhea and vomiting. The illness typically lasts 3 to 8 days. Most people develop an immune response that eventually gets rid of the virus. While this natural response develops, the virus can make you quite ill. CAUSES  Many different viruses can cause gastroenteritis, such as rotavirus or noroviruses. You can catch one of these viruses by consuming contaminated food or water. You may also catch a virus by sharing utensils or other personal items with an infected person or by touching a contaminated surface. SYMPTOMS  The most common symptoms are diarrhea and vomiting. These problems can cause a severe loss of body fluids (dehydration) and a body salt (electrolyte) imbalance. Other symptoms may include:  Fever.  Headache.  Fatigue.  Abdominal pain. DIAGNOSIS  Your caregiver can usually diagnose viral gastroenteritis based on your symptoms and a physical exam. A stool sample may also be taken to test for the presence of viruses or other infections. TREATMENT  This illness typically goes away on its own. Treatments are aimed at rehydration. The most serious cases of viral gastroenteritis involve vomiting so severely that you are not able to keep fluids down. In these cases, fluids must be given through an intravenous line (IV). HOME CARE INSTRUCTIONS   Drink enough fluids to keep your urine clear or pale yellow. Drink small amounts of fluids frequently and increase the amounts as tolerated.  Ask your caregiver for specific rehydration instructions.  Avoid:  Foods high in sugar.  Alcohol.  Carbonated drinks.  Tobacco.  Juice.  Caffeine drinks.  Extremely hot or cold fluids.  Fatty, greasy foods.  Too much intake of  anything at one time.  Dairy products until 24 to 48 hours after diarrhea stops.  You may consume probiotics. Probiotics are active cultures of beneficial bacteria. They may lessen the amount and number of diarrheal stools in adults. Probiotics can be found in yogurt with active cultures and in supplements.  Wash your hands well to avoid spreading the virus.  Only take over-the-counter or prescription medicines for pain, discomfort, or fever as directed by your caregiver. Do not give aspirin to children. Antidiarrheal medicines are not recommended.  Ask your caregiver if you should continue to take your regular prescribed and over-the-counter medicines.  Keep all follow-up appointments as directed by your caregiver. SEEK IMMEDIATE MEDICAL CARE IF:   You are unable to keep fluids down.  You do not urinate at least once every 6 to 8 hours.  You develop shortness of breath.  You notice blood in your stool or vomit. This may look like coffee grounds.  You have abdominal pain that increases or is concentrated in one small area (localized).  You have persistent vomiting or diarrhea.  You have a fever.  The patient is a child younger than 3 months, and he or she has a fever.  The patient is a child older than 3 months, and he or she has a fever and persistent symptoms.  The patient is a child older than 3 months, and he or she has a fever and symptoms suddenly get worse.  The patient is a baby, and he or she has no tears when crying. MAKE SURE YOU:   Understand these instructions.  Will watch your condition.  Will get help right  away if you are not doing well or get worse.   This information is not intended to replace advice given to you by your health care provider. Make sure you discuss any questions you have with your health care provider.   Document Released: 08/16/2005 Document Revised: 11/08/2011 Document Reviewed: 06/02/2011 Elsevier Interactive Patient Education [employer]

## 2015-08-27 NOTE — ED Notes (Signed)
Pt tolerating gingerale at this time.

## 2015-08-27 NOTE — ED Notes (Addendum)
Patient complaining of vomiting x 4 and numerous occurences of diarrhea since Monday. States he called his doctor and was told to come to ER for "fluids and nausea medicine." States has had some abdominal pain but none at this time.

## 2015-09-19 ENCOUNTER — Encounter (HOSPITAL_COMMUNITY): Payer: Medicare Other | Attending: Cardiovascular Disease

## 2015-09-19 DIAGNOSIS — Z5189 Encounter for other specified aftercare: Secondary | ICD-10-CM | POA: Diagnosis present

## 2015-09-19 DIAGNOSIS — Z9861 Coronary angioplasty status: Secondary | ICD-10-CM | POA: Insufficient documentation

## 2015-09-19 DIAGNOSIS — K627 Radiation proctitis: Secondary | ICD-10-CM

## 2015-09-19 DIAGNOSIS — E611 Iron deficiency: Secondary | ICD-10-CM

## 2015-09-19 LAB — CBC WITH DIFFERENTIAL/PLATELET
BASOS ABS: 0.1 10*3/uL (ref 0.0–0.1)
Basophils Relative: 1 %
Eosinophils Absolute: 0.8 10*3/uL — ABNORMAL HIGH (ref 0.0–0.7)
Eosinophils Relative: 6 %
HEMATOCRIT: 46 % (ref 39.0–52.0)
Hemoglobin: 15.2 g/dL (ref 13.0–17.0)
LYMPHS PCT: 36 %
Lymphs Abs: 4.7 10*3/uL — ABNORMAL HIGH (ref 0.7–4.0)
MCH: 30.7 pg (ref 26.0–34.0)
MCHC: 33 g/dL (ref 30.0–36.0)
MCV: 92.9 fL (ref 78.0–100.0)
Monocytes Absolute: 1.1 10*3/uL — ABNORMAL HIGH (ref 0.1–1.0)
Monocytes Relative: 9 %
NEUTROS ABS: 6.4 10*3/uL (ref 1.7–7.7)
Neutrophils Relative %: 48 %
Platelets: 328 10*3/uL (ref 150–400)
RBC: 4.95 MIL/uL (ref 4.22–5.81)
RDW: 12.7 % (ref 11.5–15.5)
WBC: 13.2 10*3/uL — AB (ref 4.0–10.5)

## 2015-09-20 LAB — FERRITIN: FERRITIN: 105 ng/mL (ref 24–336)

## 2015-11-08 ENCOUNTER — Other Ambulatory Visit (HOSPITAL_COMMUNITY): Payer: Self-pay | Admitting: Oncology

## 2015-11-11 ENCOUNTER — Encounter (HOSPITAL_COMMUNITY): Payer: Self-pay | Admitting: *Deleted

## 2015-11-11 ENCOUNTER — Encounter (HOSPITAL_COMMUNITY): Payer: Medicare Other | Attending: Cardiovascular Disease

## 2015-11-11 DIAGNOSIS — Z5189 Encounter for other specified aftercare: Secondary | ICD-10-CM | POA: Diagnosis present

## 2015-11-11 DIAGNOSIS — Z9861 Coronary angioplasty status: Secondary | ICD-10-CM | POA: Insufficient documentation

## 2015-11-11 DIAGNOSIS — C9110 Chronic lymphocytic leukemia of B-cell type not having achieved remission: Secondary | ICD-10-CM

## 2015-11-11 LAB — CBC WITH DIFFERENTIAL/PLATELET
BASOS ABS: 0.1 10*3/uL (ref 0.0–0.1)
BASOS PCT: 0 %
EOS ABS: 0.8 10*3/uL — AB (ref 0.0–0.7)
EOS PCT: 6 %
HCT: 45.6 % (ref 39.0–52.0)
HEMOGLOBIN: 15.1 g/dL (ref 13.0–17.0)
Lymphocytes Relative: 36 %
Lymphs Abs: 4.8 10*3/uL — ABNORMAL HIGH (ref 0.7–4.0)
MCH: 30.9 pg (ref 26.0–34.0)
MCHC: 33.1 g/dL (ref 30.0–36.0)
MCV: 93.3 fL (ref 78.0–100.0)
MONO ABS: 1 10*3/uL (ref 0.1–1.0)
MONOS PCT: 7 %
NEUTROS ABS: 6.8 10*3/uL (ref 1.7–7.7)
Neutrophils Relative %: 51 %
PLATELETS: 337 10*3/uL (ref 150–400)
RBC: 4.89 MIL/uL (ref 4.22–5.81)
RDW: 12.7 % (ref 11.5–15.5)
WBC: 13.5 10*3/uL — ABNORMAL HIGH (ref 4.0–10.5)

## 2015-11-11 LAB — COMPREHENSIVE METABOLIC PANEL
ALBUMIN: 3.8 g/dL (ref 3.5–5.0)
ALK PHOS: 59 U/L (ref 38–126)
ALT: 20 U/L (ref 17–63)
ANION GAP: 7 (ref 5–15)
AST: 23 U/L (ref 15–41)
BUN: 17 mg/dL (ref 6–20)
CALCIUM: 8.6 mg/dL — AB (ref 8.9–10.3)
CO2: 25 mmol/L (ref 22–32)
Chloride: 110 mmol/L (ref 101–111)
Creatinine, Ser: 1.22 mg/dL (ref 0.61–1.24)
GFR calc non Af Amer: 55 mL/min — ABNORMAL LOW (ref >60)
GLUCOSE: 113 mg/dL — AB (ref 65–99)
POTASSIUM: 4.2 mmol/L (ref 3.5–5.1)
SODIUM: 142 mmol/L (ref 135–145)
Total Bilirubin: 1 mg/dL (ref 0.3–1.2)
Total Protein: 6.5 g/dL (ref 6.5–8.1)

## 2015-11-11 LAB — LACTATE DEHYDROGENASE: LDH: 132 U/L (ref 98–192)

## 2015-11-18 ENCOUNTER — Encounter (HOSPITAL_COMMUNITY): Payer: Self-pay | Admitting: Hematology & Oncology

## 2015-11-18 ENCOUNTER — Encounter (HOSPITAL_BASED_OUTPATIENT_CLINIC_OR_DEPARTMENT_OTHER): Payer: Medicare Other | Admitting: Hematology & Oncology

## 2015-11-18 VITALS — BP 130/67 | HR 63 | Temp 98.0°F | Resp 20 | Wt 226.7 lb

## 2015-11-18 DIAGNOSIS — C9110 Chronic lymphocytic leukemia of B-cell type not having achieved remission: Secondary | ICD-10-CM

## 2015-11-18 DIAGNOSIS — E611 Iron deficiency: Secondary | ICD-10-CM | POA: Diagnosis not present

## 2015-11-18 DIAGNOSIS — C182 Malignant neoplasm of ascending colon: Secondary | ICD-10-CM

## 2015-11-18 NOTE — Progress Notes (Signed)
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 R hemicolectomy. Inability to tolerate adjuvant XELODA 02/29/2012 History of prostate cancer. TURP X 2, XRT at [facility] in 2011  Chronic lymphocytic leukemia, flow cytometry on 12/13/2014 with a small monoclonal B-cell population CD5+, CD23+ RAI clinical stage 0 CT C/A/P 06/13/2014 no findings to suggest recurrent or metastatic disease in the abdomen/pelvis Colonoscopy 05/09/15  CURRENT THERAPY: Observation   INTERVAL HISTORY: David Pugh 81 y.o. male with a history of CAD, Stage III CRC, prostate cancer and CLL who is doing well. He is here today for regular follow-up. He is up to date with his screening colonoscopy.    [name] is accompanied by his wife. I personally reviewed and went over laboratory studies with the patient at length, including his low calcium levels and stable blood counts.  He is up to date on screening colonoscopy. Patient states he had a basal cell removed from his nose recently by Dr. Allyn Kenner. He continues to follow at the [place].  Reports his big toe hurts, attributing it to peripheral neuropathy. Describes the pain as sharp, lasting only about 10 seconds at a time. Denies injuring his toe.   He exercises 30 minutes daily. Denies any heart problems. The only thing that worries him is his leukemia, though he tries not to do so after being reassured his leukemia is stable. He has no other complaints at this time.    Past Medical History  Diagnosis Date  . Prostate cancer [facility]) 2010    s/p radiation, surgery  . Radiation Feb 2011    treatment  . Biliary dyskinesia   . GERD (gastroesophageal reflux disease)   . Hiatal hernia 2010    tiny  . HTN (hypertension)   . Rosacea   . PTSD (post-traumatic stress disorder)   . Diverticula of colon 2010  . Colon polyps     tubular adenomas  . Arthritis   . Exposure to [employer]   . CAD (coronary  artery disease) 2012    a. hx of stent x2 in ramus/OM and mid LAD in 2012  b. balloon angioplaty of OM  . Peripheral neuropathy ([place])   . Anemia     hx of anemia as a child   . Colon cancer ([place]) 12/2011    s/p right hemicolectomy, did not tolerate chemo  . Iron deficiency 05/02/2012    followed by Dr. Tressie [name], iron infusions.   . Folic acid deficiency   . Heart palpitations 10/2012  . Gastroparesis 04/12/2013    borderline  . Myocardial infarction ([place]) 2012  . Glaucoma   . Abnormality of gait 12/18/2013  . Radiation proctitis   . Family history of prostate cancer   . Family history of stomach cancer     has PREMATURE VENTRICULAR CONTRACTIONS; GERD; Cancer of ascending colon ([place]); Iron deficiency; Hx of adenomatous colonic polyps; Folic acid deficiency; Peripheral neuropathy ([place]); CAD (coronary artery disease); HTN (hypertension); Dyslipidemia; Viral gastroenteritis; Abnormality of gait; Shingles outbreak, right anterior thoracic; Unstable angina ([place]); H/O agent Orange exposure; Radiation proctitis; H/O prostate cancer; Family history of prostate cancer; Family history of colon cancer; Family history of stomach cancer; Mucosal abnormality of esophagus; Mucosal abnormality of stomach; Multiple gastric polyps; History of colon cancer; Diverticulosis of colon without hemorrhage; and CLL (chronic lymphocytic leukemia) ([place]) on his problem list.      Cancer of ascending colon ([place])  02/29/2012 Procedure laparoscopic assisted R colectomy   03/09/2012 Miscellaneous Iron deficiency and folic acid deficiency   03/29/2012 Initial Diagnosis Cancer of ascending colon, CEA on 01/19/2012 was WNL   08/02/2013 Procedure Ileocolonoscopy with snare polypectomy   06/11/2014 Imaging CT abdomen/pelvis with no findings to suggest metastatic disease. Diverticulosis   Social History   Social History  . Marital Status: Married    Spouse Name: N/A  . Number of Children: 3  . Years of Education: military    Occupational History  . Retired   .     Social History Main Topics  . Smoking status: Never Smoker   . Smokeless tobacco: Never Used  . Alcohol Use: Yes     Comment: 1 cocktail/month  . Drug Use: No  . Sexual Activity: Yes    Birth Control/ Protection: None   Other Topics Concern  . None   Social History Narrative   3 tumors in [place]  Family History  Problem Relation Age of Onset  . Throat cancer Father     dx in his 1s and again in his 37s; pipe and cigar smoker  . Neuropathy Brother   . Prostate cancer Brother 62  . Melanoma Brother     dx in his 57s  . Colon cancer Neg Hx   . Cancer Paternal Uncle     smoking related cancer  . Heart attack Paternal Grandfather   . Stomach cancer Cousin 71     is allergic to shellfish allergy; sulfa antibiotics; and sulfonamide derivatives.  Mr. Hofacker does not currently have medications on file.  Past Surgical History  Procedure Laterality Date  . Cholecystectomy    . Prostatectomy    . Tonsillectomy    . Transurethral resection of prostate    . Esophagogastroduodenoscopy  10/10/2008    Dr. Tilda Burrow hiatal hernia, normal esophagus, normal stomach  . Colonoscopy  02/12/2004    Dr. Gala Romney- L side diverticula, inflammatory  colon polyps  . Esophagogastroduodenoscopy  02/12/2004    Dr. Dudley Major- normal   . Coronary stent placement  01/2011    2.5x85mm Xience drug-eluting stent in ramus intermedius/OMI vessel, 2.5x86mm Promus Element stent in mid LAD artery  . 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 Multiple colonic  polyps removed as described above. Sigmoid Diverticulosis/ 1.5 x 2 cm relatively flat ulcerated lesion in cecum/path showed adenocarcinoma of the colon. descending colon polyp with tubular adenoma and one fragment of focal high grade dysplasia.   . Knee surgery      left knee   . Partial colectomy  02/29/2012    Procedure: PARTIAL  COLECTOMY;  Surgeon: Adin Hector, MD;  Location: [facility]S;  Service: General;  Laterality: Right;  . Vasectomy    . Colonoscopy  08/28/2012    [id number] proctitis. Colonic diverticulosis/Ulcerations at the surgical anastomosis  path: ulcerated colonic mucosa with prolapse changes, tubular adenoma.   . Cardiac catheterization  02/13/2011    [facility] cardiology  . Cataract extraction      2011  . Colonoscopy N/A 08/02/2013    RMR: Colonic polyps -removed as described above. Colonic diverticulosis. Status post reight hemicolectomy. Radiation proctitis- asymptomatic.   . Cataract extraction w/phaco Right 08/06/2013    Procedure: CATARACT EXTRACTION PHACO AND INTRAOCULAR LENS PLACEMENT (IOC);  Surgeon: Tonny Branch, MD;  Location: [facility] ORS;  Service: Ophthalmology;  Laterality: Right;  CDE:20.96  . Coronary angioplasty  06/17/14    OM1  POBA  . Left heart catheterization with coronary angiogram N/A 06/17/2014    Procedure: LEFT HEART CATHETERIZATION WITH CORONARY ANGIOGRAM;  Surgeon: Burnell Blanks, MD;  Location[facility] C[facility] LAB;  Service: Cardiovascular;  Laterality: N/A;  . Colonoscopy N/A 05/09/2015    Procedure: COLONOSCOPY;  Surgeon: Daneil Dolin, MD;  Location: [facility] END[facility] SUITE;  Service: Endoscopy;  Laterality: N/A;  1200-moved to [address]5 [place] to notify pt  . Esophagogastroduodenoscopy N/A 05/09/2015    Procedure: ESOPHAGOGASTRODUODENOSCOPY (EGD);  Surgeon: Daneil Dolin, MD;  Location: [facility];  Service: Endoscopy;  Laterality: N/A;  . Givens capsule study N/A 06/03/2015    Procedure: GIVENS CAPSULE STUDY;  Surgeon: Daneil Dolin, MD;  Location: [facility];  Service: Endoscopy;  Laterality: N/A;  0700    Review of Systems  Constitutional: Negative.   HENT: Negative.   Eyes: Negative.   Respiratory: Negative.   Cardiovascular: Negative.   Gastrointestinal: Negative.   Genitourinary:  Negative for dysuria, hematuria and flank pain.  Musculoskeletal: Positive for  joint pain.      Knee pain and weakness Has OA in both knees  Skin: Negative.   Neurological: Negative for dizziness, tremors, sensory change, speech change, focal weakness and seizures.  Endo/Heme/Allergies: Negative.   Psychiatric/Behavioral: Negative.   14 point review of systems was performed and is negative except as detailed under history of present illness and above  PHYSICAL EXAMINATION ECOG PERFORMANCE STATUS: 0 - Asymptomatic  Filed Vitals:   11/18/15 1258  BP: 130/67  Pulse: 63  Temp: 98 F (36.7 C)  Resp: 20   Physical Exam  Constitutional: He is oriented to person, place, and time and well-developed, well-nourished, and in no distress. No distress. Wears glasses. HENT:  Head: Normocephalic.  Mouth/Throat: Oropharynx is clear and moist. No oropharyngeal exudate.  Eyes: Conjunctivae and EOM are normal. Pupils are equal, round, and reactive to light. No scleral icterus.  Neck: Normal range of motion. Neck supple. No JVD present. No tracheal deviation present. No thyromegaly present.  Cardiovascular: Normal rate, regular rhythm and normal heart sounds.   No murmur heard. Pulmonary/Chest: Effort normal and breath sounds normal. No respiratory distress. He has no wheezes. He has no rales. He exhibits no tenderness.  Abdominal: Soft. Bowel sounds are normal. He exhibits no distension and no mass. There is no tenderness. There is no rebound and no guarding.  Musculoskeletal: Normal range of motion. He exhibits no edema.  Lymphadenopathy:    He has no cervical adenopathy.  Neurological: He is alert and oriented to person, place, and time. No cranial nerve deficit. Coordination normal.  Skin: Skin is warm and dry. He is not diaphoretic.  Small scar on the nose consistent with basal cell removal. Psychiatric: Mood, memory, affect and judgment normal.    LABORATORY DATA: I have reviewed the data as listed. CBC    Component Value Date/Time   WBC 13.5* 11/11/2015 1150    RBC 4.89 11/11/2015 1150   HGB 15.1 11/11/2015 1150   HCT 45.6 11/11/2015 1150   PLT 337 11/11/2015 1150   MCV 93.3 11/11/2015 1150   MCH 30.9 11/11/2015 1150   MCHC 33.1 11/11/2015 1150   RDW 12.7 11/11/2015 1150   LYMPHSABS 4.8* 11/11/2015 1150   MONOABS 1.0 11/11/2015 1150   EOSABS 0.8* 11/11/2015 1150   BASOSABS 0.1 11/11/2015 1150    CMP     Component Value Date/Time   NA 142 11/11/2015 1150   K 4.2 11/11/2015 1150  CL 110 11/11/2015 1150   CO2 25 11/11/2015 1150   GLUCOSE 113* 11/11/2015 1150   BUN 17 11/11/2015 1150   CREATININE 1.22 11/11/2015 1150   CALCIUM 8.6* 11/11/2015 1150   PROT 6.5 11/11/2015 1150   PROT 6.4 12/18/2013 0941   ALBUMIN 3.8 11/11/2015 1150   AST 23 11/11/2015 1150   ALT 20 11/11/2015 1150   ALKPHOS 59 11/11/2015 1150   BILITOT 1.0 11/11/2015 1150   GFRNONAA 55* 11/11/2015 1150   GFRAA >60 11/11/2015 1150   RADIOGRAPHY: I have personally reviewed the radiological images as listed and agreed with the findings in the report.  CLINICAL DATA: History of colon cancer and prostate cancer. Chronic lymphocytic leukemia. Iron deficiency anemia.  EXAM: CT ABDOMEN AND PELVIS WITH CONTRAST (ENTEROGRAPHY)  TECHNIQUE: Multidetector CT of the abdomen and pelvis during bolus administration of intravenous contrast. Negative oral contrast VoLumen was given.  CONTRAST: 125mL OMNIPAQUE IOHEXOL 300 MG/ML SOLN  COMPARISON: CT scan 06/11/2014  FINDINGS: Lower chest: The lung bases are clear except for minimal areas of streaky atelectasis and right basilar scarring changes. No pulmonary nodules or pleural effusion. The heart is normal in size. No pericardial effusion.  Hepatobiliary: Mild diffuse fatty infiltration of the liver but no focal hepatic lesions or intrahepatic biliary dilatation. Small cysts noted in the right hepatic lobe inferiorly. The gallbladder is surgically absent. No common bile duct dilatation.  Pancreas: No mass,  inflammation or ductal dilatation.  Spleen: Normal size. No focal lesions.  Adrenals/Urinary Tract: The adrenal glands are normal.  No significant renal abnormalities. A small left renal cyst is noted. There are mild renal cortical scarring changes. No renal or obstructing ureteral calculi. No bladder calculi. No renal or bladder mass.  Stomach/Bowel: The stomach, duodenum and small bowel are unremarkable. No findings to suggest inflammatory bowel disease or small bowel mass. No obstructive findings. Surgical changes from a partial right colectomy. No colonic mass or obstruction. Scattered colonic diverticulosis without findings for acute diverticulitis.  Vascular/Lymphatic: No mesenteric or retroperitoneal mass or adenopathy. The aorta and branch vessels are patent. Moderate scattered atherosclerotic calcifications. The major venous structures are patent.  Other: The bladder, prostate gland and seminal vesicles are unremarkable. Brachytherapy seeds are noted in the prostate gland. No pelvic mass or adenopathy. No free pelvic fluid collections. No inguinal mass or adenopathy.  Musculoskeletal: No significant bony findings. No findings to suggest osseous metastatic disease.  IMPRESSION: 1. No acute abdominal/pelvic findings, mass lesions or adenopathy. 2. Status post partial right colectomy. No findings for recurrent tumor or metastatic disease. 3. Brachytherapy seeds noted in the prostate gland. No pelvic or retroperitoneal adenopathy or worrisome bone lesions. 4. No findings for small bowel inflammatory process, mass or obstruction.   Electronically Signed  By: Marijo Sanes M.D.  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 R hemicolectomy. Inability to tolerate adjuvant XELODA 02/29/2012 History of prostate cancer. TURP X 2, XRT at [facility] in 2011  Chronic lymphocytic leukemia, flow cytometry on 12/13/2014 with a small  monoclonal B-cell population CD5+, CD23+ RAI clinical stage 0 Iron deficiency Hypocalcemia  I personally reviewed and went over laboratory studies with the patient at length, including his low calcium levels and stable CBC. Encouraged the patient to begin taking a calcium plus Vitamin D supplement once daily. Given his low calcium, I have recommended checking a vitamin D level.   The patient will be due for repeat CT imaging in October of this year  for ongoing surveillance of his CRC. NCCN guidelines for surveillance for Colon cancer are as follows (1.2017):  B. Stage II, Stage III 1. H+P every 3-6 months x 2 years and then every 6 months for a total of 5 years  2. CEA every 3-6 months x 2 years and then every 6 months for a total of 5 years  3. CT CAP every 6-12 months (category 2B for frequency < 12 months) for a total of 5 years . 4.  Colonoscopy in 1 year except if no preoperative colonoscopy due to obstructing lesion, colonoscopy in 3-6 months.  A. If advanced adenoma, repeat in 1 year B. If no advanced adenoma, repeat in 3 years, then every 5 years 5. PET/CT scan is not recommended.   He will return for follow up in 4 months with repeat labs and physical exam.   This document serves as a record of services personally performed by Ancil Linsey, MD. It was created on her behalf by Arlyce Harman, a trained medical scribe. The creation of this record is based on the scribe's personal observations and the provider's statements to them. This document has been checked and approved by the attending provider.  This note was electronically signed.  I have reviewed the above documentation for accuracy and completeness and I agree with the above.  Kelby Fam. Whitney Muse, MD

## 2015-11-18 NOTE — Patient Instructions (Signed)
[facility] Discharge Instructions  RECOMMENDATIONS MADE BY THE CONSULTANT AND ANY TEST RESULTS WILL BE SENT TO YOUR REFERRING PHYSICIAN.  Exam done and seen today by Dr.Penland Need a CT scan in October Labs are stable, calcium is a little low. Need to start an over there counter Calcium +D Need a ferritin and vitamin D in a week. Follow up in 4 months and labs in 4 months. Labs in one week.    Thank you for choosing [place] at [facility] to provide your oncology and hematology care.  To afford each patient quality time with our provider, please arrive at least 15 minutes before your scheduled appointment time.   Beginning January 23rd 2017 lab work for the [facility] will be done in the  Main lab at [facility] on 1st floor. If you have a lab appointment with the [facility] please come in thru the  Main Entrance and check in at the main information desk  You need to re-schedule your appointment should you arrive 10 or more minutes late.  We strive to give you quality time with our providers, and arriving late affects you and other patients whose appointments are after yours.  Also, if you no show three or more times for appointments you may be dismissed from the clinic at the providers discretion.     Again, thank you for choosing [facility].  Our hope is that these requests will decrease the amount of time that you wait before being seen by our physicians.       _____________________________________________________________  Should you have questions after your visit to [facility], please contact our office at [phone] between the hours of 8:30 a.m. and 4:30 p.m.  Voicemails left after 4:30 p.m. will not be returned until the following business day.  For prescription refill requests, have your pharmacy contact our office.         Resources For Cancer Patients and their  Caregivers ? American Cancer Society: Can assist with transportation, wigs, general needs, runs Look Good Feel Better.        630-578-1645 ? Cancer Care: Provides financial assistance, online support groups, medication/co-pay assistance.  1-800-813-HOPE 669-823-4879) ? [facility] Assists [place] cancer patients and their families through emotional , educational and financial support.  (304)239-2657 ? [place] DSS Where to apply for food stamps, Medicaid and utility assistance. 773-093-7049 ? [facility]: Transportation to medical appointments. (680)697-9633 ? Social Security Administration: May apply for disability if have a Stage IV cancer. (220)122-9657 601-684-1802 ? [employer], Disability and Transit Services: Assists with nutrition, care and transit needs. 248-817-5837

## 2015-11-25 ENCOUNTER — Encounter (HOSPITAL_COMMUNITY): Payer: Medicare Other

## 2015-11-25 DIAGNOSIS — Z5189 Encounter for other specified aftercare: Secondary | ICD-10-CM | POA: Diagnosis not present

## 2015-11-25 DIAGNOSIS — C9110 Chronic lymphocytic leukemia of B-cell type not having achieved remission: Secondary | ICD-10-CM

## 2015-11-25 LAB — FERRITIN: Ferritin: 82 ng/mL (ref 24–336)

## 2015-11-26 ENCOUNTER — Other Ambulatory Visit (HOSPITAL_COMMUNITY): Payer: Self-pay | Admitting: Oncology

## 2015-11-26 DIAGNOSIS — E559 Vitamin D deficiency, unspecified: Secondary | ICD-10-CM

## 2015-11-26 LAB — VITAMIN D 25 HYDROXY (VIT D DEFICIENCY, FRACTURES): Vit D, 25-Hydroxy: 19.5 ng/mL — ABNORMAL LOW (ref 30.0–100.0)

## 2015-11-26 MED ORDER — ERGOCALCIFEROL 1.25 MG (50000 UT) PO CAPS: 50000.0000 [IU] | ORAL_CAPSULE | ORAL | Status: DC

## 2015-12-04 NOTE — Therapy (Signed)
David Pugh, [place], [address] Phone: [phone]   Fax:  [phone]  Patient Details  Name: David Pugh MRN: [id number] Date of Birth: June 04, 1935 Referring Provider:  [name]., MD  Encounter Date: 12/04/2015  PHYSICAL THERAPY DISCHARGE SUMMARY  Visits from Start of Care: 15  Current functional level related to goals / functional outcomes: Patient has not returned since last skilled session    Remaining deficits: Unable to assess    Education / Equipment: N/A Plan: Patient agrees to discharge.  Patient goals were partially met. Patient is being discharged due to not returning since the last visit.  ?????       Deniece Ree PT, DPT [facility] [address] [place], [place], [address] Phone: [phone]   Fax:  [phone]

## 2015-12-26 ENCOUNTER — Other Ambulatory Visit: Payer: Self-pay | Admitting: Cardiovascular Disease

## 2015-12-26 NOTE — Telephone Encounter (Signed)
Rx(s) sent to pharmacy electronically.  

## 2015-12-30 ENCOUNTER — Encounter (HOSPITAL_COMMUNITY): Payer: Self-pay | Admitting: Hematology & Oncology

## 2016-03-04 ENCOUNTER — Other Ambulatory Visit: Payer: Self-pay | Admitting: Cardiovascular Disease

## 2016-03-04 NOTE — Telephone Encounter (Signed)
Rx(s) sent to pharmacy electronically. Patient has MD OV 03/18/16

## 2016-03-18 ENCOUNTER — Ambulatory Visit: Payer: Medicare Other | Admitting: Cardiovascular Disease

## 2016-03-24 ENCOUNTER — Encounter (HOSPITAL_COMMUNITY): Payer: Self-pay | Admitting: Hematology & Oncology

## 2016-03-24 ENCOUNTER — Encounter (HOSPITAL_COMMUNITY): Payer: Medicare Other | Attending: Hematology & Oncology | Admitting: Hematology & Oncology

## 2016-03-24 ENCOUNTER — Encounter (HOSPITAL_COMMUNITY): Payer: Medicare Other

## 2016-03-24 DIAGNOSIS — E611 Iron deficiency: Secondary | ICD-10-CM

## 2016-03-24 DIAGNOSIS — Z8546 Personal history of malignant neoplasm of prostate: Secondary | ICD-10-CM

## 2016-03-24 DIAGNOSIS — C182 Malignant neoplasm of ascending colon: Secondary | ICD-10-CM

## 2016-03-24 DIAGNOSIS — E559 Vitamin D deficiency, unspecified: Secondary | ICD-10-CM | POA: Diagnosis present

## 2016-03-24 DIAGNOSIS — C9110 Chronic lymphocytic leukemia of B-cell type not having achieved remission: Secondary | ICD-10-CM

## 2016-03-24 LAB — COMPREHENSIVE METABOLIC PANEL
ALT: 26 U/L (ref 17–63)
AST: 25 U/L (ref 15–41)
Albumin: 3.7 g/dL (ref 3.5–5.0)
Alkaline Phosphatase: 51 U/L (ref 38–126)
Anion gap: 3 — ABNORMAL LOW (ref 5–15)
BUN: 12 mg/dL (ref 6–20)
CO2: 27 mmol/L (ref 22–32)
Calcium: 8.7 mg/dL — ABNORMAL LOW (ref 8.9–10.3)
Chloride: 111 mmol/L (ref 101–111)
Creatinine, Ser: 1.15 mg/dL (ref 0.61–1.24)
GFR calc Af Amer: >60 mL/min (ref >60)
GFR calc non Af Amer: 59 mL/min — ABNORMAL LOW (ref >60)
Glucose, Bld: 105 mg/dL — ABNORMAL HIGH (ref 65–99)
Potassium: 4.7 mmol/L (ref 3.5–5.1)
Sodium: 141 mmol/L (ref 135–145)
Total Bilirubin: 0.7 mg/dL (ref 0.3–1.2)
Total Protein: 6.4 g/dL — ABNORMAL LOW (ref 6.5–8.1)

## 2016-03-24 LAB — CBC WITH DIFFERENTIAL/PLATELET
Basophils Absolute: 0 10*3/uL (ref 0.0–0.1)
Basophils Relative: 0 %
Eosinophils Absolute: 1.1 10*3/uL — ABNORMAL HIGH (ref 0.0–0.7)
Eosinophils Relative: 8 %
HCT: 44 % (ref 39.0–52.0)
Hemoglobin: 14.8 g/dL (ref 13.0–17.0)
Lymphocytes Relative: 46 %
Lymphs Abs: 6.2 10*3/uL — ABNORMAL HIGH (ref 0.7–4.0)
MCH: 31.4 pg (ref 26.0–34.0)
MCHC: 33.6 g/dL (ref 30.0–36.0)
MCV: 93.2 fL (ref 78.0–100.0)
Monocytes Absolute: 0.9 10*3/uL (ref 0.1–1.0)
Monocytes Relative: 7 %
Neutro Abs: 5.2 10*3/uL (ref 1.7–7.7)
Neutrophils Relative %: 39 %
Platelets: 320 10*3/uL (ref 150–400)
RBC: 4.72 MIL/uL (ref 4.22–5.81)
RDW: 13.1 % (ref 11.5–15.5)
WBC: 13.4 10*3/uL — ABNORMAL HIGH (ref 4.0–10.5)

## 2016-03-24 LAB — LACTATE DEHYDROGENASE: LDH: 132 U/L (ref 98–192)

## 2016-03-24 LAB — FERRITIN: Ferritin: 101 ng/mL (ref 24–336)

## 2016-03-24 NOTE — Progress Notes (Signed)
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 R hemicolectomy. Inability to tolerate adjuvant XELODA 02/29/2012 History of prostate cancer. TURP X 2, XRT at [facility] in 2011  Chronic lymphocytic leukemia, flow cytometry on 12/13/2014 with a small monoclonal B-cell population CD5+, CD23+ RAI clinical stage 0 CT C/A/P 06/13/2014 no findings to suggest recurrent or metastatic disease in the abdomen/pelvis Colonoscopy 05/09/15    Cancer of ascending colon ([place])   02/29/2012 Procedure    laparoscopic assisted R colectomy      03/09/2012 Miscellaneous    Iron deficiency and folic acid deficiency      03/29/2012 Initial Diagnosis    Cancer of ascending colon, CEA on 01/19/2012 was WNL      08/02/2013 Procedure    Ileocolonoscopy with snare polypectomy      06/11/2014 Imaging    CT abdomen/pelvis with no findings to suggest metastatic disease. Diverticulosis        CURRENT THERAPY: Observation   INTERVAL HISTORY: David Pugh 81 y.o. male with a history of CAD, Stage III CRC, prostate cancer and CLL who is doing well. He is here today for regular follow-up. He is up to date with his screening colonoscopy.    David Pugh is accompanied by his wife. I personally reviewed and went over laboratory studies with the patient, including low calcium level.  He has not been taking a calcium supplement. He has been taking the Vitamin D supplement.  He did not need to have any teeth removed to resolve previous tooth pain, instead he had an infection between two teeth. He notes this is finally better.  He has no major complaints at this time. His appetite is good, though he has lost some weight due to the antibiotics he was on. They decreased his appetite.Marland Kitchen He currently has diarrhea secondary to taking antibiotic. Normally he moves his bowels twice a day. He denies any new skin cancer lesions.  No B symptoms. No change in energy or  activity. Colonoscopy was in September of last year.   Past Medical History:  Diagnosis Date  . Abnormality of gait 12/18/2013  . Anemia    hx of anemia as a child   . Arthritis   . Biliary dyskinesia   . CAD (coronary artery disease) 2012   a. hx of stent x2 in ramus/OM and mid LAD in 2012  b. balloon angioplaty of OM  . Colon cancer ([place]) 12/2011   s/p right hemicolectomy, did not tolerate chemo  . Colon polyps    tubular adenomas  . Diverticula of colon 2010  . Exposure to [employer]   . Family history of prostate cancer   . Family history of stomach cancer   . Folic acid deficiency   . Gastroparesis 04/12/2013   borderline  . GERD (gastroesophageal reflux disease)   . Glaucoma   . Heart palpitations 10/2012  . Hiatal hernia 2010   tiny  . HTN (hypertension)   . Iron deficiency 05/02/2012   followed by Dr. Tressie [name], iron infusions.   . Myocardial infarction ([place]) 2012  . Peripheral neuropathy ([place])   . Prostate cancer [facility]) 2010   s/p radiation, surgery  . PTSD (post-traumatic stress disorder)   . Radiation Feb 2011   treatment  . Radiation proctitis   . Rosacea     has PREMATURE VENTRICULAR CONTRACTIONS; GERD; Cancer of ascending colon ([place]); Iron deficiency; Hx of adenomatous colonic  polyps; Folic acid deficiency; Peripheral neuropathy ([place]); CAD (coronary artery disease); HTN (hypertension); Dyslipidemia; Viral gastroenteritis; Abnormality of gait; Shingles outbreak, right anterior thoracic; Unstable angina ([place]); H/O agent Orange exposure; Radiation proctitis; H/O prostate cancer; Family history of prostate cancer; Family history of colon cancer; Family history of stomach cancer; Mucosal abnormality of esophagus; Mucosal abnormality of stomach; Multiple gastric polyps; History of colon cancer; Diverticulosis of colon without hemorrhage; CLL (chronic lymphocytic leukemia) ([place]); and Vitamin D deficiency on his problem list.    Social History   Social History  .  Marital status: Married    Spouse name: N/A  . Number of children: 3  . Years of education: military   Occupational History  . Retired   .  Retired   Social History Main Topics  . Smoking status: Never Smoker  . Smokeless tobacco: Never Used  . Alcohol use Yes     Comment: 1 cocktail/month  . Drug use: No  . Sexual activity: Yes    Birth control/ protection: None   Other Topics Concern  . None   Social History Narrative  . None   3 tumors in [place]  Family History  Problem Relation Age of Onset  . Throat cancer Father     dx in his 61s and again in his 23s; pipe and cigar smoker  . Neuropathy Brother   . Prostate cancer Brother 82  . Melanoma Brother     dx in his 35s  . Cancer Paternal Uncle     smoking related cancer  . Heart attack Paternal Grandfather   . Stomach cancer Cousin 70  . Colon cancer Neg Hx      is allergic to shellfish allergy; sulfa antibiotics; and sulfonamide derivatives.  David Pugh does not currently have medications on file.  Past Surgical History:  Procedure Laterality Date  . CARDIAC CATHETERIZATION  02/13/2011   [facility] cardiology  . CATARACT EXTRACTION     2011  . CATARACT EXTRACTION W/PHACO Right 08/06/2013   Procedure: CATARACT EXTRACTION PHACO AND INTRAOCULAR LENS PLACEMENT (IOC);  Surgeon: Tonny Branch, MD;  Location: [facility] ORS;  Service: Ophthalmology;  Laterality: Right;  CDE:20.96  . CHOLECYSTECTOMY    . COLONOSCOPY  02/12/2004   Dr. Gala Romney- L side diverticula, inflammatory  colon polyps  . COLONOSCOPY  01/19/2012   RMR: Prominent changes involving the rectal mucosa consistent with radiation-induced proctitis. Multiple colonic  polyps removed as described above. Sigmoid Diverticulosis/ 1.5 x 2 cm relatively flat ulcerated lesion in cecum/path showed adenocarcinoma of the colon. descending colon polyp with tubular adenoma and one fragment of focal high grade dysplasia.   . COLONOSCOPY  08/28/2012   [id number] proctitis.  Colonic diverticulosis/Ulcerations at the surgical anastomosis  path: ulcerated colonic mucosa with prolapse changes, tubular adenoma.   . COLONOSCOPY N/A 08/02/2013   RMR: Colonic polyps -removed as described above. Colonic diverticulosis. Status post reight hemicolectomy. Radiation proctitis- asymptomatic.   Marland Kitchen COLONOSCOPY N/A 05/09/2015   Procedure: COLONOSCOPY;  Surgeon: Daneil Dolin, MD;  Location: [facility] ENDO SUITE;  Service: Endoscopy;  Laterality: N/A;  1200-moved to [address]5 [place] to notify pt  . CORONARY ANGIOPLASTY  06/17/14   OM1 POBA  . CORONARY STENT PLACEMENT  01/2011   2.5x71mm Xience drug-eluting stent in ramus intermedius/OMI vessel, 2.5x54mm Promus Element stent in mid LAD artery  . ESOPHAGOGASTRODUODENOSCOPY  10/10/2008   Dr. Tilda Burrow hiatal hernia, normal esophagus, normal stomach  . ESOPHAGOGASTRODUODENOSCOPY  02/12/2004   Dr. Dudley Major- normal   . ESOPHAGOGASTRODUODENOSCOPY N/A  05/09/2015   Procedure: ESOPHAGOGASTRODUODENOSCOPY (EGD);  Surgeon: Daneil Dolin, MD;  Location: [facility];  Service: Endoscopy;  Laterality: N/A;  . GIVENS CAPSULE STUDY N/A 06/03/2015   Procedure: GIVENS CAPSULE STUDY;  Surgeon: Daneil Dolin, MD;  Location: [facility] END[facility];  Service: Endoscopy;  Laterality: N/A;  0700  . KNEE SURGERY     left knee   . LEFT HEART CATHETERIZATION WITH CORONARY ANGIOGRAM N/A 06/17/2014   Procedure: LEFT HEART CATHETERIZATION WITH CORONARY ANGIOGRAM;  Surgeon: Burnell Blanks, MD;  Location: [facility] CATH LAB;  Service: Cardiovascular;  Laterality: N/A;  . PARTIAL COLECTOMY  02/29/2012   Procedure: PARTIAL COLECTOMY;  Surgeon: Adin Hector, MD;  Location: [facility] ORS;  Service: General;  Laterality: Right;  . PROSTATECTOMY    . TONSILLECTOMY    . TRANSURETHRAL RESECTION OF PROSTATE    . VASECTOMY      Review of Systems  Constitutional: Negative.   HENT: Negative.   Eyes: Negative.   Respiratory: Negative.   Cardiovascular: Negative.   Gastrointestinal: Positive for  diarrhea.      Diarrhea secondary to taking antibiotic  Genitourinary:  Negative for dysuria, hematuria and flank pain.  Musculoskeletal: Positive for joint pain.      Knee pain and weakness Has OA in both knees  Skin: Negative.   Neurological: Negative for dizziness, tremors, sensory change, speech change, focal weakness and seizures.  Endo/Heme/Allergies: Negative.   Psychiatric/Behavioral: Negative.   14 point review of systems was performed and is negative except as detailed under history of present illness and above   PHYSICAL EXAMINATION ECOG PERFORMANCE STATUS: 0 - Asymptomatic Vitals - 1 value per visit [id number]  SYSTOLIC [id number]  DIASTOLIC 67  Pulse 63  Temperature 98  Respirations 20  Weight (lb) 226.7  Height   BMI 31.63    Physical Exam  Constitutional: He is oriented to person, place, and time and well-developed, well-nourished, and in no distress. No distress.  HENT:  Head: Normocephalic.  Mouth/Throat: Oropharynx is clear and moist. No oropharyngeal exudate.  Eyes: Conjunctivae and EOM are normal. Pupils are equal, round, and reactive to light. No scleral icterus.  Neck: Normal range of motion. Neck supple. No JVD present. No tracheal deviation present. No thyromegaly present.  Cardiovascular: Normal rate, regular rhythm and normal heart sounds.   No murmur heard. Pulmonary/Chest: Effort normal and breath sounds normal. No respiratory distress. He has no wheezes. He has no rales. He exhibits no tenderness.  Abdominal: Soft. Bowel sounds are normal. He exhibits no distension and no mass. There is no tenderness. There is no rebound and no guarding.  Musculoskeletal: Normal range of motion. He exhibits no edema.  Lymphadenopathy:    He has no cervical adenopathy. No axillary adenopathy, no supraclavicular adenopathy Neurological: He is alert and oriented to person, place, and time. No cranial nerve deficit. Coordination normal.  Skin: Skin is warm and dry. He is not  diaphoretic.  Small scar on the nose consistent with basal cell removal. Psychiatric: Mood, memory, affect and judgment normal.    LABORATORY DATA: I have reviewed the data as listed. CBC    Component Value Date/Time   WBC 13.4 (H) 03/24/2016 1048   RBC 4.72 03/24/2016 1048   HGB 14.8 03/24/2016 1048   HCT 44.0 03/24/2016 1048   PLT 320 03/24/2016 1048   MCV 93.2 03/24/2016 1048   MCH 31.4 03/24/2016 1048   MCHC 33.6 03/24/2016 1048   RDW 13.1 03/24/2016 1048   LYMPHSABS  6.2 (H) 03/24/2016 1048   MONOABS 0.9 03/24/2016 1048   EOSABS 1.1 (H) 03/24/2016 1048   BASOSABS 0.0 03/24/2016 1048    CMP     Component Value Date/Time   NA 141 03/24/2016 1048   K 4.7 03/24/2016 1048   CL 111 03/24/2016 1048   CO2 27 03/24/2016 1048   GLUCOSE 105 (H) 03/24/2016 1048   BUN 12 03/24/2016 1048   CREATININE 1.15 03/24/2016 1048   CALCIUM 8.7 (L) 03/24/2016 1048   PROT 6.4 (L) 03/24/2016 1048   PROT 6.4 12/18/2013 0941   ALBUMIN 3.7 03/24/2016 1048   AST 25 03/24/2016 1048   ALT 26 03/24/2016 1048   ALKPHOS 51 03/24/2016 1048   BILITOT 0.7 03/24/2016 1048   GFRNONAA 59 (L) 03/24/2016 1048   GFRAA >60 03/24/2016 1048    THERAPY PLAN:  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 R hemicolectomy. Inability to tolerate adjuvant XELODA 02/29/2012 History of prostate cancer. TURP X 2, [facility] in [facility]  Chronic lymphocytic leukemia, flow cytometry on 12/13/2014 with a small monoclonal B-cell population CD5+, CD23+ RAI clinical stage 0 Iron deficiency Hypocalcemia  I personally reviewed and went over laboratory studies with the patient at length, including his low calcium levels and stable CBC. Encouraged the patient to begin taking a calcium plus Vitamin D supplement once daily.   The patient will be due for repeat CT imaging in October of this year for ongoing surveillance of his CRC. NCCN guidelines for surveillance for Colon cancer are as follows (1.2017):  B. Stage II, Stage  III 1. H+P every 3-6 months x 2 years and then every 6 months for a total of 5 years  2. CEA every 3-6 months x 2 years and then every 6 months for a total of 5 years  3. CT CAP every 6-12 months (category 2B for frequency < 12 months) for a total of 5 years . 4.  Colonoscopy in 1 year except if no preoperative colonoscopy due to obstructing lesion, colonoscopy in 3-6 months.  A. If advanced adenoma, repeat in 1 year B. If no advanced adenoma, repeat in 3 years, then every 5 years 5. PET/CT scan is not recommended  He is scheduled for follow up in cardiology with Dr. Sallyanne Kuster on 04/28/16.  He will return for follow up post imaging to review. CLL is stable.   Orders Placed This Encounter  Procedures  . CT Abdomen Pelvis W Contrast    Standing Status:   Future    Standing Expiration Date:   03/24/2017    Order Specific Question:   If indicated for the ordered procedure, I authorize the administration of contrast media per Radiology protocol    Answer:   Yes    Order Specific Question:   Reason for Exam (SYMPTOM  OR DIAGNOSIS REQUIRED)    Answer:   restaging CRC    Order Specific Question:   Preferred imaging location?    Answer:   [facility]  . CBC with Differential    Standing Status:   Future    Standing Expiration Date:   06/14/2016  . Comprehensive metabolic panel    Standing Status:   Future    Standing Expiration Date:   06/14/2016  . Vitamin D 25 hydroxy    Standing Status:   Future    Standing Expiration Date:   03/24/2017    This document serves as a record of services personally performed by Ancil Linsey, MD. It  was created on her behalf by Arlyce Harman, a trained medical scribe. The creation of this record is based on the scribe's personal observations and the provider's statements to them. This document has been checked and approved by the attending provider.  This note was electronically signed.  I have reviewed the above documentation for accuracy and  completeness and I agree with the above.  Kelby Fam. Whitney Muse, MD

## 2016-03-24 NOTE — Patient Instructions (Signed)
[place] at [facility] Discharge Instructions  RECOMMENDATIONS MADE BY THE CONSULTANT AND ANY TEST RESULTS WILL BE SENT TO YOUR REFERRING PHYSICIAN.  CT chest/abdomen/pelvis on 06/11/16  Return to clinic post CT scan with Dr. Gustavus Bryant same day as return visit with Dr. Whitney Muse in the lab on the 1st floor  Thank you for choosing [place] at [facility] to provide your oncology and hematology care.  To afford each patient quality time with our provider, please arrive at least 15 minutes before your scheduled appointment time.   Beginning January 23rd 2017 lab work for the [facility] will be done in the  Main lab at [facility] on 1st floor. If you have a lab appointment with the [place] please come in thru the  Main Entrance and check in at the main information desk  You need to re-schedule your appointment should you arrive 10 or more minutes late.  We strive to give you quality time with our providers, and arriving late affects you and other patients whose appointments are after yours.  Also, if you no show three or more times for appointments you may be dismissed from the clinic at the providers discretion.     Again, thank you for choosing [facility].  Our hope is that these requests will decrease the amount of time that you wait before being seen by our physicians.       _____________________________________________________________  Should you have questions after your visit to [facility], please contact our office at [phone] between the hours of 8:30 a.m. and 4:30 p.m.  Voicemails left after 4:30 p.m. will not be returned until the following business day.  For prescription refill requests, have your pharmacy contact our office.         Resources For Cancer Patients and their Caregivers ? American Cancer Society: Can assist with transportation, wigs, general needs, runs Look Good Feel  Better.        215-303-6784 ? Cancer Care: Provides financial assistance, online support groups, medication/co-pay assistance.  1-800-813-HOPE 2027726891) ? [place] Assists [place] cancer patients and their families through emotional , educational and financial support.  562-342-4169 ? [place] DSS Where to apply for food stamps, Medicaid and utility assistance. 218-609-3692 ? [facility]: Transportation to medical appointments. 617-197-0404 ? Social Security Administration: May apply for disability if have a Stage IV cancer. 719-262-6863 662-192-9297 ? [employer], Disability and Transit Services: Assists with nutrition, care and transit needs. [place] Support Programs: @10RELATIVEDAYS @ > Cancer Support Group  2nd Tuesday of the month 1pm-2pm, Journey Room  > Creative Journey  3rd Tuesday of the month 1130am-1pm, Journey Room  > Look Good Feel Better  1st Wednesday of the month 10am-12 noon, Journey Room (Call [place] to register [phone])

## 2016-03-25 LAB — VITAMIN D 25 HYDROXY (VIT D DEFICIENCY, FRACTURES): Vit D, 25-Hydroxy: 21.3 ng/mL — ABNORMAL LOW (ref 30.0–100.0)

## 2016-04-16 ENCOUNTER — Other Ambulatory Visit: Payer: Self-pay | Admitting: Cardiovascular Disease

## 2016-04-16 ENCOUNTER — Encounter (HOSPITAL_COMMUNITY): Payer: Self-pay | Admitting: Hematology & Oncology

## 2016-04-16 NOTE — Telephone Encounter (Signed)
Rx request sent to pharmacy.  

## 2016-04-19 ENCOUNTER — Other Ambulatory Visit (HOSPITAL_COMMUNITY): Payer: Self-pay

## 2016-04-19 DIAGNOSIS — E559 Vitamin D deficiency, unspecified: Secondary | ICD-10-CM

## 2016-04-19 MED ORDER — ERGOCALCIFEROL 1.25 MG (50000 UT) PO CAPS: 50000.0000 [IU] | ORAL_CAPSULE | ORAL | 1 refills | Status: DC

## 2016-04-28 ENCOUNTER — Ambulatory Visit (INDEPENDENT_AMBULATORY_CARE_PROVIDER_SITE_OTHER): Payer: Medicare Other | Admitting: Cardiovascular Disease

## 2016-04-28 ENCOUNTER — Encounter: Payer: Self-pay | Admitting: Cardiovascular Disease

## 2016-04-28 VITALS — BP 133/69 | HR 52 | Ht 71.0 in | Wt 223.6 lb

## 2016-04-28 DIAGNOSIS — E785 Hyperlipidemia, unspecified: Secondary | ICD-10-CM | POA: Diagnosis not present

## 2016-04-28 DIAGNOSIS — E669 Obesity, unspecified: Secondary | ICD-10-CM

## 2016-04-28 DIAGNOSIS — Z79899 Other long term (current) drug therapy: Secondary | ICD-10-CM

## 2016-04-28 DIAGNOSIS — I2510 Atherosclerotic heart disease of native coronary artery without angina pectoris: Secondary | ICD-10-CM | POA: Diagnosis not present

## 2016-04-28 DIAGNOSIS — I10 Essential (primary) hypertension: Secondary | ICD-10-CM

## 2016-04-28 NOTE — Patient Instructions (Signed)
Medication Instructions: Dr Sallyanne Kuster has recommended making the following medication changes: 1. STOP Isosorbide  Labwork: Your physician recommends that you return for lab work at your earliest [place].  Testing/Procedures: NONE ORDERED  Follow-up: Dr Sallyanne Kuster recommends that you schedule a follow-up appointment in 1 year. You will receive a reminder letter in the mail two months in advance. If you don't receive a letter, please call our office to schedule the follow-up appointment.  If you need a refill on your cardiac medications before your next appointment, please call your pharmacy.

## 2016-04-28 NOTE — Progress Notes (Signed)
Cardiology Office Note    Date:  04/29/2016   ID:  David Pugh, DOB 03/31/1935, MRN [id number]  PCP:  [name], MD  Cardiologist:   Sanda Klein, MD   Chief Complaint  Patient presents with  . Follow-up    History of Present Illness:  David Pugh is a 81 y.o. male with coronary artery disease (drug-eluting stents to mid LAD and ramus intermedius in 2012, balloon angioplasty of small OM branch in 2015) who returns for follow-up. Continues to go to rehabilitation and exercises 24 minutes every time on the elliptical as well as other lightweight exercises. He has not had any problems with angina pectoris. He denies angina pectoris. His balance problems have improved. Not had other focal neurological complaints and denies palpitations, syncope, edema or claudication.  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 Significant comorbid conditions include systemic hypertension and hyperlipidemia, both treated. In 2010, he underwent surgery and radiation for prostate cancer. In 2012, he had stage IIIB colon cancer with 2 out of 20 positive lymph nodes but was intolerant of chemotherapy. 4 years have passed since his surgery and he has no evidence of disease. He has chronic lymphocytic leukemia, but does not require any treatment for this at this time.  Past Medical History:  Diagnosis Date  . Abnormality of gait 12/18/2013  . Anemia    hx of anemia as a child   . Arthritis   . Biliary dyskinesia   . CAD (coronary artery disease) 2012   a. hx of stent x2 in ramus/OM and mid LAD in 2012  b. balloon angioplaty of OM  . Colon cancer ([place]) 12/2011   s/p right hemicolectomy, did not tolerate chemo  . Colon polyps    tubular adenomas  . Diverticula of colon 2010  . Exposure to [employer]   . Family history of prostate cancer   . Family history of stomach cancer   . Folic acid deficiency   . Gastroparesis 04/12/2013   borderline  . GERD (gastroesophageal reflux disease)   . Glaucoma   . Heart palpitations 10/2012  . Hiatal hernia 2010   tiny  . HTN (hypertension)   . Iron deficiency 05/02/2012   followed by Dr. Tressie [name], iron infusions.   . Myocardial infarction ([place]) 2012  . Peripheral neuropathy ([place])   . Prostate cancer [facility]) 2010   s/p radiation, surgery  . PTSD (post-traumatic stress disorder)   . Radiation Feb 2011   treatment  . Radiation proctitis   . Rosacea     Past Surgical History:  Procedure Laterality Date  . CARDIAC CATHETERIZATION  02/13/2011   [facility], [facility] cardiology  . CATARACT EXTRACTION     2011  . CATARACT EXTRACTION W/PHACO Right 08/06/2013   Procedure: CATARACT EXTRACTION PHACO AND INTRAOCULAR LENS PLACEMENT (IOC);  Surgeon: Tonny Branch, MD;  Location: [facility] ORS;  Service: Ophthalmology;  Laterality: Right;  CDE:20.96  . CHOLECYSTECTOMY    . COLONOSCOPY  02/12/2004   Dr. Gala Romney- L side diverticula, inflammatory  colon polyps  . COLONOSCOPY  01/19/2012   RMR: Prominent changes involving the rectal mucosa consistent with radiation-induced proctitis. Multiple colonic  polyps removed as described above. Sigmoid Diverticulosis/ 1.5 x 2 cm relatively flat ulcerated lesion in cecum/path showed adenocarcinoma of the colon. descending colon polyp with tubular adenoma and one fragment of focal high grade dysplasia.   . COLONOSCOPY  08/28/2012   [id number] proctitis. Colonic diverticulosis/Ulcerations at  the surgical anastomosis  path: ulcerated colonic mucosa with prolapse changes, tubular adenoma.   . COLONOSCOPY N/A 08/02/2013   RMR: Colonic polyps -removed as described above. Colonic diverticulosis. Status post reight hemicolectomy. Radiation proctitis- asymptomatic.   Marland Kitchen COLONOSCOPY N/A 05/09/2015   Procedure: COLONOSCOPY;  Surgeon: Daneil Dolin, MD;  Location: [facility] ENDO SUITE;  Service: Endoscopy;  Laterality: N/A;  1200-moved to [address] to notify pt  . CORONARY ANGIOPLASTY   06/17/14   OM1 POBA  . CORONARY STENT PLACEMENT  01/2011   2.5x109mm Xience drug-eluting stent in ramus intermedius/OMI vessel, 2.5x46mm Promus Element stent in mid LAD artery  . ESOPHAGOGASTRODUODENOSCOPY  10/10/2008   Dr. Tilda Burrow hiatal hernia, normal esophagus, normal stomach  . ESOPHAGOGASTRODUODENOSCOPY  02/12/2004   Dr. Dudley Major- normal   . ESOPHAGOGASTRODUODENOSCOPY N/A 05/09/2015   Procedure: ESOPHAGOGASTRODUODENOSCOPY (EGD);  Surgeon: Daneil Dolin, MD;  Location: [facility];  Service: Endoscopy;  Laterality: N/A;  . GIVENS CAPSULE STUDY N/A 06/03/2015   Procedure: GIVENS CAPSULE STUDY;  Surgeon: Daneil Dolin, MD;  Location: [facility];  Service: Endoscopy;  Laterality: N/A;  0700  . KNEE SURGERY     left knee   . LEFT HEART CATHETERIZATION WITH CORONARY ANGIOGRAM N/A 06/17/2014   Procedure: LEFT HEART CATHETERIZATION WITH CORONARY ANGIOGRAM;  Surgeon: Burnell Blanks, MD;  Location: [facility] CATH LAB;  Service: Cardiovascular;  Laterality: N/A;  . PARTIAL COLECTOMY  02/29/2012   Procedure: PARTIAL COLECTOMY;  Surgeon: Adin Hector, MD;  Location: [facility] ORS;  Service: General;  Laterality: Right;  . PROSTATECTOMY    . TONSILLECTOMY    . TRANSURETHRAL RESECTION OF PROSTATE    . VASECTOMY      Current Medications: Outpatient Medications Prior to Visit  Medication Sig Dispense Refill  . aspirin EC 81 MG tablet Take 81 mg by mouth daily.    . B Complex Vitamins (VITAMIN B COMPLEX PO) Take 1 tablet by mouth daily.     . cetirizine (ZYRTEC) 10 MG tablet Take 10 mg by mouth daily.    . citalopram (CELEXA) 10 MG tablet Take 10 mg by mouth every morning.     . clopidogrel (PLAVIX) 75 MG tablet TAKE (1) TABLET BY MOUTH ONCE DAILY. 30 tablet 0  . Cyanocobalamin (RA VITAMIN B-12 TR) 1000 MCG TBCR Take by mouth.    . ergocalciferol (VITAMIN D2) 50000 units capsule Take 1 capsule (50,000 Units total) by mouth 2 (two) times a week. 8 capsule 1  . fluticasone (FLONASE) 50 MCG/ACT nasal  spray Place 2 sprays into both nostrils every evening.    . folic acid (FOLVITE) 1 MG tablet TAKE 1 TABLET ONCE DAILY. 30 tablet 11  . latanoprost (XALATAN) 0.005 % ophthalmic solution Place 1 drop into both eyes at bedtime.     Marland Kitchen loperamide (IMODIUM) 2 MG capsule Take 2 mg by mouth as needed for diarrhea or loose stools.    Marland Kitchen losartan (COZAAR) 50 MG tablet Take 25 mg by mouth 2 (two) times daily.     . metoprolol (LOPRESSOR) 50 MG tablet Take 50 mg by mouth 2 (two) times daily.     . Multiple Vitamin (MULTIVITAMIN WITH MINERALS) TABS tablet Take 1 tablet by mouth daily.    Marland Kitchen NITROSTAT 0.4 MG SL tablet PLACE ONE (1) TABLET UNDER TONGUE EVERY 5 MINUTES UP TO (3) DOSES AS NEEDED FOR CHEST PAIN. 25 tablet 1  . ofloxacin (OCUFLOX) 0.3 % ophthalmic solution Take as directed.    . pantoprazole (  PROTONIX) 40 MG tablet TAKE 2 TABLETS BY MOUTH ONCE DAILY. (Patient taking differently: takes one tablet twice daily) 60 tablet 6  . pravastatin (PRAVACHOL) 40 MG tablet TAKE 1 TABLET BY MOUTH AT BEDTIME. 30 tablet 1  . Probiotic Product (PROBIOTIC DAILY PO) Take 1 capsule by mouth daily.    Marland Kitchen UNABLE TO FIND Take 1 capsule by mouth once. Med Name: Instaflex    . isosorbide mononitrate (IMDUR) 30 MG 24 hr tablet TAKE (1) TABLET BY MOUTH ONCE DAILY. 90 tablet 2  . clindamycin (CLEOCIN) 150 MG capsule Take 300 mg by mouth 3 (three) times daily.    . ondansetron (ZOFRAN) 4 MG tablet Take 1 tablet (4 mg total) by mouth every 6 (six) hours. (Patient not taking: Reported on 04/28/2016) 12 tablet 0   No facility-administered medications prior to visit.      Allergies:   Shellfish allergy; Sulfa antibiotics; and Sulfonamide derivatives   Social History   Social History  . Marital status: Married    Spouse name: N/A  . Number of children: 3  . Years of education: military   Occupational History  . Retired   .  Retired   Social History Main Topics  . Smoking status: Never Smoker  . Smokeless tobacco: Never  Used  . Alcohol use Yes     Comment: 1 cocktail/month  . Drug use: No  . Sexual activity: Yes    Birth control/ protection: None   Other Topics Concern  . None   Social History Narrative  . None     Family History:  The patient's family history includes Cancer in his paternal uncle; Heart attack in his paternal grandfather; Melanoma in his brother; Neuropathy in his brother; Prostate cancer (age of onset: 32) in his brother; Stomach cancer (age of onset: 43) in his cousin; Throat cancer in his father.   ROS:   Please see the history of present illness.    ROS All other systems reviewed and are negative.   PHYSICAL EXAM:   VS:  BP 133/69 (BP Location: Left Arm, Patient Position: Sitting, Cuff Size: Normal)   Pulse (!) 52   Ht 5\' 11"  (1.803 m)   Wt 223 lb 9.6 oz (101.4 kg)   SpO2 97%   BMI 31.19 kg/m    GEN: Well nourished, well developed, in no acute distress  HEENT: normal  Neck: no JVD, carotid bruits, or masses Cardiac: RRR; no murmurs, rubs, or gallops,no edema  Respiratory:  clear to auscultation bilaterally, normal work of breathing GI: soft, nontender, nondistended, + BS MS: no deformity or atrophy  Skin: warm and dry, no rash Neuro:  Alert and Oriented x 3, Strength and sensation are intact Psych: euthymic mood, full affect  Wt Readings from Last 3 Encounters:  04/28/16 223 lb 9.6 oz (101.4 kg)  11/18/15 226 lb 11.2 oz (102.8 kg)  08/27/15 222 lb (100.7 kg)      Studies/Labs Reviewed:   EKG:  EKG is ordered today.  The ekg ordered today demonstrates Sinus bradycardia, nonspecific T-wave inversion in leads V2-V3, tall R wave in lead V2, QTC 411 ms  Recent Labs: 03/24/2016: ALT 26; BUN 12; Creatinine, Ser 1.15; Hemoglobin 14.8; Platelets 320; Potassium 4.7; Sodium 141   Lipid Panel    Component Value Date/Time   CHOL 121 02/20/2015 1157   TRIG 97 02/20/2015 1157   HDL 56 02/20/2015 1157   CHOLHDL 2.2 02/20/2015 1157   VLDL 19 02/20/2015 1157   LDLCALC  46 02/20/2015 1157    ASSESSMENT:    1. Coronary artery disease involving native coronary artery of native heart without angina pectoris   2. Dyslipidemia   3. Essential hypertension   4. Mild obesity   5. Medication management      PLAN:  In order of problems listed above:  1. CAD: No angina despite active lifestyle. I think he can stop his isosorbide 2. HLP: Last lipid profile that I have was excellent. Will get the more recent labs that Dr. Scotty Court performed as well. 3. HTN: controlled. 4. Obesity: It loss encouraged, steadily increase physical activity, but especially limit intake of calories and carbohydrates 5. Prostate cancer and colon cancer in remission, chronic lymphocytic leukemia not requiring treatment    Medication Adjustments/Labs and Tests Ordered: Current medicines are reviewed at length with the patient today.  Concerns regarding medicines are outlined above.  Medication changes, Labs and Tests ordered today are listed in the Patient Instructions below. Patient Instructions  Medication Instructions: Dr Sallyanne Kuster has recommended making the following medication changes: 1. STOP Isosorbide  Labwork: Your physician recommends that you return for lab work at your earliest [place].  Testing/Procedures: NONE ORDERED  Follow-up: Dr Sallyanne Kuster recommends that you schedule a follow-up appointment in 1 year. You will receive a reminder letter in the mail two months in advance. If you don't receive a letter, please call our office to schedule the follow-up appointment.  If you need a refill on your cardiac medications before your next appointment, please call your pharmacy.    Signed, Sanda Klein, MD  04/29/2016 5:13 PM    [facility] [place], [place],   [address] Phone: [phone]; Fax: [phone]

## 2016-05-06 LAB — COMPREHENSIVE METABOLIC PANEL
ALT: 14 U/L (ref 9–46)
AST: 15 U/L (ref 10–35)
Albumin: 3.7 g/dL (ref 3.6–5.1)
Alkaline Phosphatase: 52 U/L (ref 40–115)
BUN: 16 mg/dL (ref 7–25)
CHLORIDE: 106 mmol/L (ref 98–110)
CO2: 26 mmol/L (ref 20–31)
Calcium: 9 mg/dL (ref 8.6–10.3)
Creat: 1.2 mg/dL — ABNORMAL HIGH (ref 0.70–1.18)
GLUCOSE: 82 mg/dL (ref 65–99)
POTASSIUM: 4.2 mmol/L (ref 3.5–5.3)
SODIUM: 141 mmol/L (ref 135–146)
Total Bilirubin: 1 mg/dL (ref 0.2–1.2)
Total Protein: 6.1 g/dL (ref 6.1–8.1)

## 2016-05-06 LAB — LIPID PANEL
CHOL/HDL RATIO: 1.7 ratio (ref <=5.0)
CHOLESTEROL: 125 mg/dL (ref 125–200)
HDL: 73 mg/dL (ref >=40)
LDL CALC: 37 mg/dL (ref <130)
Triglycerides: 75 mg/dL (ref <150)
VLDL: 15 mg/dL (ref <30)

## 2016-05-07 ENCOUNTER — Telehealth: Payer: Self-pay | Admitting: *Deleted

## 2016-05-07 ENCOUNTER — Other Ambulatory Visit: Payer: Self-pay | Admitting: Cardiovascular Disease

## 2016-05-07 NOTE — Telephone Encounter (Signed)
Sounds like he needs to continue taking it. Was just trying to simplify meds, but it seems to be serving a purpose

## 2016-05-07 NOTE — Telephone Encounter (Signed)
F/u Message ° °Pt returning RN call. Please call back to discuss  °

## 2016-05-07 NOTE — Telephone Encounter (Signed)
Spoke to patient-advised MD Croitoru is aware he will continue the medication.  Verbalized understanding.    Will update med list.

## 2016-05-07 NOTE — Telephone Encounter (Signed)
Rx(s) sent to pharmacy electronically.  

## 2016-05-07 NOTE — Addendum Note (Signed)
Addended by: Patria Mane A on: 05/07/2016 05:23 PM   Modules accepted: Orders

## 2016-05-07 NOTE — Telephone Encounter (Signed)
Received call from patient-pt states he was seen by MD Croitoru on 8/30 and was informed to stop taking isosorbide.  Pt reports he was exercising earlier this week and had chest pain x 2-therefore has started to take medication again and denies CP since.  Reports starting medication again on Wednesday 9/6.  Advised I would route to MD to make aware.   Routed to MD Croitoru.

## 2016-05-14 ENCOUNTER — Other Ambulatory Visit (HOSPITAL_COMMUNITY): Payer: Self-pay | Admitting: Oncology

## 2016-05-14 DIAGNOSIS — E559 Vitamin D deficiency, unspecified: Secondary | ICD-10-CM

## 2016-06-11 ENCOUNTER — Ambulatory Visit (HOSPITAL_COMMUNITY)
Admission: RE | Admit: 2016-06-11 | Discharge: 2016-06-11 | Disposition: A | Discharge status: Home / Self Care | Payer: Medicare Other | Source: Ambulatory Visit | Attending: Hematology & Oncology | Admitting: Hematology & Oncology

## 2016-06-11 ENCOUNTER — Encounter (HOSPITAL_COMMUNITY): Payer: Medicare Other | Attending: Hematology & Oncology

## 2016-06-11 DIAGNOSIS — I700 Atherosclerosis of aorta: Secondary | ICD-10-CM | POA: Diagnosis not present

## 2016-06-11 DIAGNOSIS — C182 Malignant neoplasm of ascending colon: Secondary | ICD-10-CM | POA: Diagnosis not present

## 2016-06-11 DIAGNOSIS — Z9049 Acquired absence of other specified parts of digestive tract: Secondary | ICD-10-CM | POA: Diagnosis not present

## 2016-06-11 DIAGNOSIS — N400 Enlarged prostate without lower urinary tract symptoms: Secondary | ICD-10-CM | POA: Insufficient documentation

## 2016-06-11 LAB — COMPREHENSIVE METABOLIC PANEL
ALBUMIN: 3.9 g/dL (ref 3.5–5.0)
ALT: 19 U/L (ref 17–63)
ANION GAP: 4 — AB (ref 5–15)
AST: 21 U/L (ref 15–41)
Alkaline Phosphatase: 52 U/L (ref 38–126)
BUN: 13 mg/dL (ref 6–20)
CHLORIDE: 107 mmol/L (ref 101–111)
CO2: 26 mmol/L (ref 22–32)
Calcium: 9 mg/dL (ref 8.9–10.3)
Creatinine, Ser: 1.14 mg/dL (ref 0.61–1.24)
GFR calc Af Amer: >60 mL/min (ref >60)
GFR calc non Af Amer: 59 mL/min — ABNORMAL LOW (ref >60)
GLUCOSE: 105 mg/dL — AB (ref 65–99)
POTASSIUM: 4.2 mmol/L (ref 3.5–5.1)
SODIUM: 137 mmol/L (ref 135–145)
Total Bilirubin: 1 mg/dL (ref 0.3–1.2)
Total Protein: 6.8 g/dL (ref 6.5–8.1)

## 2016-06-11 LAB — CBC WITH DIFFERENTIAL/PLATELET
BASOS ABS: 0 10*3/uL (ref 0.0–0.1)
BASOS PCT: 0 %
EOS PCT: 6 %
Eosinophils Absolute: 0.8 10*3/uL — ABNORMAL HIGH (ref 0.0–0.7)
HEMATOCRIT: 43 % (ref 39.0–52.0)
Hemoglobin: 14.7 g/dL (ref 13.0–17.0)
LYMPHS ABS: 4.6 10*3/uL — AB (ref 0.7–4.0)
Lymphocytes Relative: 36 %
MCH: 31.6 pg (ref 26.0–34.0)
MCHC: 34.2 g/dL (ref 30.0–36.0)
MCV: 92.5 fL (ref 78.0–100.0)
Monocytes Absolute: 1.4 10*3/uL — ABNORMAL HIGH (ref 0.1–1.0)
Monocytes Relative: 11 %
NEUTROS ABS: 6 10*3/uL (ref 1.7–7.7)
Neutrophils Relative %: 47 %
Platelets: 309 10*3/uL (ref 150–400)
RBC: 4.65 MIL/uL (ref 4.22–5.81)
RDW: 13 % (ref 11.5–15.5)
WBC: 12.8 10*3/uL — ABNORMAL HIGH (ref 4.0–10.5)

## 2016-06-11 MED ORDER — IOPAMIDOL (ISOVUE-300) INJECTION 61%
100.0000 mL | Freq: Once | INTRAVENOUS | Status: AC | PRN
  Administered 2016-06-11: 100 mL via INTRAVENOUS

## 2016-06-12 LAB — VITAMIN D 25 HYDROXY (VIT D DEFICIENCY, FRACTURES): Vit D, 25-Hydroxy: 40 ng/mL (ref 30.0–100.0)

## 2016-06-14 ENCOUNTER — Encounter (HOSPITAL_BASED_OUTPATIENT_CLINIC_OR_DEPARTMENT_OTHER): Payer: Medicare Other | Admitting: Hematology & Oncology

## 2016-06-14 ENCOUNTER — Encounter (HOSPITAL_COMMUNITY): Payer: Self-pay | Admitting: Hematology & Oncology

## 2016-06-14 VITALS — BP 147/70 | HR 61 | Temp 98.2°F | Resp 16 | Ht 70.5 in | Wt 227.2 lb

## 2016-06-14 DIAGNOSIS — C9110 Chronic lymphocytic leukemia of B-cell type not having achieved remission: Secondary | ICD-10-CM | POA: Diagnosis not present

## 2016-06-14 DIAGNOSIS — E559 Vitamin D deficiency, unspecified: Secondary | ICD-10-CM

## 2016-06-14 DIAGNOSIS — Z8546 Personal history of malignant neoplasm of prostate: Secondary | ICD-10-CM

## 2016-06-14 DIAGNOSIS — C182 Malignant neoplasm of ascending colon: Secondary | ICD-10-CM | POA: Diagnosis not present

## 2016-06-14 DIAGNOSIS — E611 Iron deficiency: Secondary | ICD-10-CM | POA: Diagnosis not present

## 2016-06-14 MED ORDER — VITAMIN D (ERGOCALCIFEROL) 1.25 MG (50000 UNIT) PO CAPS: ORAL_CAPSULE | ORAL | 0 refills | Status: DC

## 2016-06-14 NOTE — Assessment & Plan Note (Signed)
CBC from 10/13 is available for review and reviewed with the patient in detail. Results are noted above.  Counts are stable. No evidence of anemia, thrombocytopenia.  Patient is very familiar with reasons to treat CLL such as worsening anemia, thrombocytopenia, fatigue, weight loss. Will continue with observation

## 2016-06-14 NOTE — Assessment & Plan Note (Signed)
Vitamin D level is currently excellent. Will continue drisdol as prescribed X 1 additional month.  I discussed changing to OTC maintenance at 2000 IU daily or 14000 IU weekly in the future. Will continue to monitor vitamin D levels moving forward.

## 2016-06-14 NOTE — Progress Notes (Signed)
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 R hemicolectomy. Inability to tolerate adjuvant XELODA 02/29/2012 History of prostate cancer. TURP X 2, XRT at [facility] in 2011  Chronic lymphocytic leukemia, flow cytometry on 12/13/2014 with a small monoclonal B-cell population CD5+, CD23+ RAI clinical stage 0 CT C/A/P 06/13/2014 no findings to suggest recurrent or metastatic disease in the abdomen/pelvis Colonoscopy 05/09/15    Cancer of ascending colon ([place])   02/29/2012 Procedure    laparoscopic assisted R colectomy      03/09/2012 Miscellaneous    Iron deficiency and folic acid deficiency      03/29/2012 Initial Diagnosis    Cancer of ascending colon, CEA on 01/19/2012 was WNL      08/02/2013 Procedure    Ileocolonoscopy with snare polypectomy      06/11/2014 Imaging    CT abdomen/pelvis with no findings to suggest metastatic disease. Diverticulosis      06/11/2016 Imaging    CT Abdomen/Pelvis with no findings to suggest recurrent or metastatic disease. Prostatomegaly.         CURRENT THERAPY: Observation   INTERVAL HISTORY: David Pugh 81 y.o. male with a history of CAD, Stage III CRC, prostate cancer and CLL who is doing well. He is here today for regular follow-up of his CLL, stage III CRC and prostate cancer. He is up to date with his screening colonoscopy.    David Pugh is accompanied by his wife. I personally reviewed and went over imaging and laboratory studies with the patient.  He has been taking his prescription Vitamin D twice weekly. He has four pills left.   His only concern is a persistent right front forehead pain. This pain has been occurring for the last month. He notes having dental surgery done last Wednesday on that side for a cyst between his upper teeth. He was prescribed an antibiotic from his dentist for this about a month ago. He is scheduled to return to his dentist on Thursday to  check it. He admits he has had sinus issues for 40 years. He is worried he could have brain cancer. This pain is not hurting at this time and has decreased since his dental surgery. His wife notes he has suspected it may be his eyes. He notes that nothing seems to make his headache better, he has tried several OTC medications. Sleep does not seem to help.   He has been eating okay. He received a flu shot at [facility] this year. No change in bladder or bowel habits. No B symptoms. No weight loss or change in energy levels.   Past Medical History:  Diagnosis Date  . Abnormality of gait 12/18/2013  . Anemia    hx of anemia as a child   . Arthritis   . Biliary dyskinesia   . CAD (coronary artery disease) 2012   a. hx of stent x2 in ramus/OM and mid LAD in 2012  b. balloon angioplaty of OM  . Colon cancer ([place]) 12/2011   s/p right hemicolectomy, did not tolerate chemo  . Colon polyps    tubular adenomas  . Diverticula of colon 2010  . Exposure to [employer]   . Family history of prostate cancer   . Family history of stomach cancer   . Folic acid deficiency   . Gastroparesis 04/12/2013   borderline  . GERD (gastroesophageal reflux disease)   . Glaucoma   .  Heart palpitations 10/2012  . Hiatal hernia 2010   tiny  . HTN (hypertension)   . Iron deficiency 05/02/2012   followed by Dr. Tressie [name], iron infusions.   . Myocardial infarction 2012  . Peripheral neuropathy ([place])   . Prostate cancer [facility]) 2010   s/p radiation, surgery  . PTSD (post-traumatic stress disorder)   . Radiation Feb 2011   treatment  . Radiation proctitis   . Rosacea     has PREMATURE VENTRICULAR CONTRACTIONS; GERD; Cancer of ascending colon ([place]); Iron deficiency; Hx of adenomatous colonic polyps; Folic acid deficiency; Peripheral neuropathy ([place]); CAD (coronary artery disease); HTN (hypertension); Dyslipidemia; Viral gastroenteritis; Abnormality of gait; Shingles outbreak, right anterior thoracic; Unstable angina ([place]);  H/O agent Orange exposure; Radiation proctitis; H/O prostate cancer; Family history of prostate cancer; Family history of colon cancer; Family history of stomach cancer; Mucosal abnormality of esophagus; Mucosal abnormality of stomach; Multiple gastric polyps; History of colon cancer; Diverticulosis of colon without hemorrhage; CLL (chronic lymphocytic leukemia) ([place]); and Vitamin D deficiency on his problem list.    Social History   Social History  . Marital status: Married    Spouse name: N/A  . Number of children: 3  . Years of education: military   Occupational History  . Retired   .  Retired   Social History Main Topics  . Smoking status: Never Smoker  . Smokeless tobacco: Never Used  . Alcohol use Yes     Comment: 1 cocktail/month  . Drug use: No  . Sexual activity: Yes    Birth control/ protection: None   Other Topics Concern  . None   Social History Narrative  . None   3 tumors in [place]  Family History  Problem Relation Age of Onset  . Throat cancer Father     dx in his 57s and again in his 67s; pipe and cigar smoker  . Neuropathy Brother   . Prostate cancer Brother 20  . Melanoma Brother     dx in his 14s  . Cancer Paternal Uncle     smoking related cancer  . Heart attack Paternal Grandfather   . Stomach cancer Cousin 45  . Colon cancer Neg Hx      is allergic to shellfish allergy; sulfa antibiotics; and sulfonamide derivatives.  David Pugh does not currently have medications on file.  Past Surgical History:  Procedure Laterality Date  . CARDIAC CATHETERIZATION  02/13/2011   [facility], [facility] cardiology  . CATARACT EXTRACTION     2011  . CATARACT EXTRACTION W/PHACO Right 08/06/2013   Procedure: CATARACT EXTRACTION PHACO AND INTRAOCULAR LENS PLACEMENT (IOC);  Surgeon: Tonny Branch, MD;  Location: [facility] ORS;  Service: Ophthalmology;  Laterality: Right;  CDE:20.96  . CHOLECYSTECTOMY    . COLONOSCOPY  02/12/2004   Dr. Gala Romney- L side diverticula,  inflammatory  colon polyps  . COLONOSCOPY  01/19/2012   RMR: Prominent changes involving the rectal mucosa consistent with radiation-induced proctitis. Multiple colonic  polyps removed as described above. Sigmoid Diverticulosis/ 1.5 x 2 cm relatively flat ulcerated lesion in cecum/path showed adenocarcinoma of the colon. descending colon polyp with tubular adenoma and one fragment of focal high grade dysplasia.   . COLONOSCOPY  08/28/2012   [id number] proctitis. Colonic diverticulosis/Ulcerations at the surgical anastomosis  path: ulcerated colonic mucosa with prolapse changes, tubular adenoma.   . COLONOSCOPY N/A 08/02/2013   RMR: Colonic polyps -removed as described above. Colonic diverticulosis. Status post reight hemicolectomy. Radiation proctitis- asymptomatic.   Marland Kitchen COLONOSCOPY N/A  05/09/2015   Procedure: COLONOSCOPY;  Surgeon: Daneil Dolin, MD;  Location: [facility] ENDO SUITE;  Service: Endoscopy;  Laterality: N/A;  1200-moved to [address] to notify pt  . CORONARY ANGIOPLASTY  06/17/14   OM1 POBA  . CORONARY STENT PLACEMENT  01/2011   2.5x55mm Xience drug-eluting stent in ramus intermedius/OMI vessel, 2.5x44mm Promus Element stent in mid LAD artery  . ESOPHAGOGASTRODUODENOSCOPY  10/10/2008   Dr. Tilda Burrow hiatal hernia, normal esophagus, normal stomach  . ESOPHAGOGASTRODUODENOSCOPY  02/12/2004   Dr. Dudley Major- normal   . ESOPHAGOGASTRODUODENOSCOPY N/A 05/09/2015   Procedure: ESOPHAGOGASTRODUODENOSCOPY (EGD);  Surgeon: Daneil Dolin, MD;  Location: [facility];  Service: Endoscopy;  Laterality: N/A;  . GIVENS CAPSULE STUDY N/A 06/03/2015   Procedure: GIVENS CAPSULE STUDY;  Surgeon: Daneil Dolin, MD;  Location: [facility];  Service: Endoscopy;  Laterality: N/A;  0700  . KNEE SURGERY     left knee   . LEFT HEART CATHETERIZATION WITH CORONARY ANGIOGRAM N/A 06/17/2014   Procedure: LEFT HEART CATHETERIZATION WITH CORONARY ANGIOGRAM;  Surgeon: Burnell Blanks, MD;  Location: [facility] CATH LAB;   Service: Cardiovascular;  Laterality: N/A;  . PARTIAL COLECTOMY  02/29/2012   Procedure: PARTIAL COLECTOMY;  Surgeon: Adin Hector, MD;  Location: [facility] ORS;  Service: General;  Laterality: Right;  . PROSTATECTOMY    . TONSILLECTOMY    . TRANSURETHRAL RESECTION OF PROSTATE    . VASECTOMY      Review of Systems  Constitutional: Negative.   HENT: Negative.        Persistent right front forehead pain for the last month  Eyes: Negative.   Respiratory: Negative.   Cardiovascular: Negative.   Gastrointestinal: Negative.   Genitourinary: Negative.   Musculoskeletal: Positive for joint pain.       Knee pain and weakness Has OA in both knees   Skin: Negative.   Neurological: Negative.   Endo/Heme/Allergies: Negative.   Psychiatric/Behavioral: Negative.   All other systems reviewed and are negative.   14 point review of systems was performed and is negative except as detailed under history of present illness and above   PHYSICAL EXAMINATION  ECOG PERFORMANCE STATUS: 0 - Asymptomatic Vitals with BMI 06/14/2016  Height 5' 10.5"  Weight 227 lbs 3 oz  BMI [id number]  Systolic [id number]  Diastolic 70  Pulse 61  Respirations 16    Physical Exam  Constitutional: He is oriented to person, place, and time and well-developed, well-nourished, and in no distress.  HENT:  Head: Normocephalic and atraumatic.  Nose: Nose normal.  Mouth/Throat: Oropharynx is clear and moist. No oropharyngeal exudate.  Eyes: Conjunctivae and EOM are normal. Pupils are equal, round, and reactive to light. Right eye exhibits no discharge. Left eye exhibits no discharge. No scleral icterus.  Neck: Normal range of motion. Neck supple. No tracheal deviation present. No thyromegaly present.  Cardiovascular: Normal rate, regular rhythm and normal heart sounds.  Exam reveals no gallop and no friction rub.   No murmur heard. Pulmonary/Chest: Effort normal and breath sounds normal. He has no wheezes. He has no rales.  Abdominal:  Soft. Bowel sounds are normal. He exhibits no distension and no mass. There is no tenderness. There is no rebound and no guarding.  Musculoskeletal: Normal range of motion. He exhibits no edema.  Lymphadenopathy:    He has no cervical adenopathy.  Neurological: He is alert and oriented to person, place, and time. He has normal reflexes. No cranial nerve deficit. Gait normal. Coordination normal.  Skin: Skin is warm and dry. No rash noted.  Small scar on the nose consistent with basal cell removal.  Psychiatric: Mood, memory, affect and judgment normal.  Nursing note and vitals reviewed.    LABORATORY DATA: I have reviewed the data as listed. CBC    Component Value Date/Time   WBC 12.8 (H) 06/11/2016 0920   RBC 4.65 06/11/2016 0920   HGB 14.7 06/11/2016 0920   HCT 43.0 06/11/2016 0920   PLT 309 06/11/2016 0920   MCV 92.5 06/11/2016 0920   MCH 31.6 06/11/2016 0920   MCHC 34.2 06/11/2016 0920   RDW 13.0 06/11/2016 0920   LYMPHSABS 4.6 (H) 06/11/2016 0920   MONOABS 1.4 (H) 06/11/2016 0920   EOSABS 0.8 (H) 06/11/2016 0920   BASOSABS 0.0 06/11/2016 0920    CMP     Component Value Date/Time   NA 137 06/11/2016 0920   K 4.2 06/11/2016 0920   CL 107 06/11/2016 0920   CO2 26 06/11/2016 0920   GLUCOSE 105 (H) 06/11/2016 0920   BUN 13 06/11/2016 0920   CREATININE 1.14 06/11/2016 0920   CREATININE 1.20 (H) 05/05/2016 0840   CALCIUM 9.0 06/11/2016 0920   PROT 6.8 06/11/2016 0920   PROT 6.4 12/18/2013 0941   ALBUMIN 3.9 06/11/2016 0920   AST 21 06/11/2016 0920   ALT 19 06/11/2016 0920   ALKPHOS 52 06/11/2016 0920   BILITOT 1.0 06/11/2016 0920   GFRNONAA 59 (L) 06/11/2016 0920   GFRAA >60 06/11/2016 0920   RADIOLOGY: I have reviewed the images below and agree with the reported results Study Result   CLINICAL DATA:  Right-sided colon cancer with right hemicolectomy 01/17/2012. Prostate cancer 7 years ago. History biliary dyskinesia.  EXAM: CT ABDOMEN AND PELVIS WITH  CONTRAST  TECHNIQUE: Multidetector CT imaging of the abdomen and pelvis was performed using the standard protocol following bolus administration of intravenous contrast.  CONTRAST:  142mL ISOVUE-300 IOPAMIDOL (ISOVUE-300) INJECTION 61%  COMPARISON:  08/27/2015  FINDINGS: Lower chest: Mild motion degradation. Scarring at the right lung base. Borderline cardiomegaly, without pericardial or pleural effusion. Tiny hiatal hernia.  Hepatobiliary: A 9 mm right liver lobe hypo attenuating lesion on image 24/series 2 is similar back to the 2015 and most consistent with a cyst. No suspicious liver lesion. Cholecystectomy, without biliary ductal dilatation.  Pancreas: Normal, without mass or ductal dilatation.  Spleen: Normal in size, without focal abnormality.  Adrenals/Urinary Tract: Normal adrenal glands. 1.6 cm lower pole left renal cyst. Mild renal cortical thinning bilaterally. No hydronephrosis. Normal urinary bladder.  Stomach/Bowel: Normal remainder of the stomach. Scattered colonic diverticula. Right hemicolectomy. Normal small bowel.  Vascular/Lymphatic: Aortic and branch vessel atherosclerosis. No abdominopelvic adenopathy.  Reproductive: Radiation seeds in the prostate.  Other: No significant free fluid. No evidence of omental or peritoneal disease. Right paracentral fat containing abdominal wall laxity.  Musculoskeletal: No acute osseous abnormality.  IMPRESSION: 1. Status post right hemicolectomy, without recurrent or metastatic disease. 2.  Aortic atherosclerosis. 3. Prostatomegaly.   Electronically Signed   By: Abigail Miyamoto M.D.   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 R hemicolectomy. Inability to tolerate adjuvant XELODA 02/29/2012 History of prostate cancer. TURP X 2, XRT at [facility] in 2011  Chronic lymphocytic leukemia, flow cytometry on 12/13/2014 with a small monoclonal B-cell population  CD5+, CD23+ RAI clinical stage 0 Iron deficiency Hypocalcemia  I personally reviewed and went over laboratory studies with the patient at  length. CBC is stable. Results are noted above.  I reviewed CT imaging of the abdomen and pelvis with the patient at length, no evidence of recurrent CRC or prostate carcinoma. Results are noted above.  I spoke with the patient about transitioning to maintenance OTC Vitamin D. He expressed he is okay with continuing to take prescription Vitamin D twice weekly. I have refilled this prescription today. He will continue to take Vitamin D twice weekly for another month or two, then he will reduce his dose to once weekly. I discussed that one can get too much vitamin D and he will need to transition to maintenance in the near future.    NCCN guidelines for surveillance for Colon cancer are as follows (1.2017):  B. Stage II, Stage III 1. H+P every 3-6 months x 2 years and then every 6 months for a total of 5 years  2. CEA every 3-6 months x 2 years and then every 6 months for a total of 5 years  3. CT CAP every 6-12 months (category 2B for frequency < 12 months) for a total of 5 years . 4.  Colonoscopy in 1 year except if no preoperative colonoscopy due to obstructing lesion, colonoscopy in 3-6 months.  A. If advanced adenoma, repeat in 1 year B. If no advanced adenoma, repeat in 3 years, then every 5 years 5. PET/CT scan is not recommended   He complains of persistent right front forehead pain for the last month. He had dental surgery on the right side last week and is scheduled to follow up with his dentist again on Thursday. The patient will contact us if this pain persists and we will order a Head CT.  He has had a flu shot this year.  He will return for follow up in 3 months.   Orders Placed This Encounter  Procedures  . CBC with Differential    Standing Status:   Future    Standing Expiration Date:   06/14/2017  . Comprehensive metabolic panel     Standing Status:   Future    Standing Expiration Date:   06/14/2017  . Ferritin    Standing Status:   Future    Standing Expiration Date:   06/14/2017  . VITAMIN D 25 Hydroxy (Vit-D Deficiency, Fractures)    Standing Status:   Future    Standing Expiration Date:   06/14/2017    This document serves as a record of services personally performed by Ancil Linsey, MD. It was created on her behalf by Arlyce Harman, a trained medical scribe. The creation of this record is based on the scribe's personal observations and the provider's statements to them. This document has been checked and approved by the attending provider.  This note was electronically signed.  I have reviewed the above documentation for accuracy and completeness and I agree with the above.  Kelby Fam. Whitney Muse, MD

## 2016-06-14 NOTE — Patient Instructions (Addendum)
[facility] Discharge Instructions  RECOMMENDATIONS MADE BY THE CONSULTANT AND ANY TEST RESULTS WILL BE SENT TO YOUR REFERRING PHYSICIAN.  You saw Dr. Penland today. Follow up in 3 months with labs.  Thank you for choosing [facility] at [facility] to provide your oncology and hematology care.  To afford each patient quality time with our provider, please arrive at least 15 minutes before your scheduled appointment time.   Beginning January 23rd 2017 lab work for the Cancer Center will be done in the  Main lab at [facility] on 1st floor. If you have a lab appointment with the Cancer Center please come in thru the  Main Entrance and check in at the main information desk  You need to re-schedule your appointment should you arrive 10 or more minutes late.  We strive to give you quality time with our providers, and arriving late affects you and other patients whose appointments are after yours.  Also, if you no show three or more times for appointments you may be dismissed from the clinic at the providers discretion.     Again, thank you for choosing [facility].  Our hope is that these requests will decrease the amount of time that you wait before being seen by our physicians.       _____________________________________________________________  Should you have questions after your visit to [facility], please contact our office at [phone] between the hours of 8:30 a.m. and 4:30 p.m.  Voicemails left after 4:30 p.m. will not be returned until the following business day.  For prescription refill requests, have your pharmacy contact our office.         Resources For Cancer Patients and their Caregivers ? American Cancer Society: Can assist with transportation, wigs, general needs, runs Look Good Feel Better.        1-888-227-6333 ? Cancer Care: Provides financial assistance, online support groups,  medication/co-pay assistance.  1-800-813-HOPE (4673) ? [facility] Assists [place] cancer patients and their families through emotional , educational and financial support.  336-427-4357 ? [place] DSS Where to apply for food stamps, Medicaid and utility assistance. 336-342-1394 ? [facility]: Transportation to medical appointments. 336-347-2287 ? Social Security Administration: May apply for disability if have a Stage IV cancer. 336-342-7796 1-800-772-1213 ? [place] Aging, Disability and Transit Services: Assists with nutrition, care and transit needs. 336-349-2343  Cancer Center Support Programs: @10RELATIVEDAYS@ > Cancer Support Group  2nd Tuesday of the month 1pm-2pm, Journey Room  > Creative Journey  3rd Tuesday of the month 1130am-1pm, Journey Room  > Look Good Feel Better  1st Wednesday of the month 10am-12 noon, Journey Room (Call American Cancer Society to register 1-800-395-5775)    

## 2016-06-16 ENCOUNTER — Other Ambulatory Visit: Payer: Self-pay | Admitting: Cardiovascular Disease

## 2016-06-17 NOTE — Telephone Encounter (Signed)
REFILL 

## 2016-06-23 ENCOUNTER — Telehealth (HOSPITAL_COMMUNITY): Payer: Self-pay | Admitting: *Deleted

## 2016-06-25 ENCOUNTER — Other Ambulatory Visit (HOSPITAL_COMMUNITY): Payer: Self-pay | Admitting: Oncology

## 2016-06-25 DIAGNOSIS — C9110 Chronic lymphocytic leukemia of B-cell type not having achieved remission: Secondary | ICD-10-CM

## 2016-06-25 DIAGNOSIS — R519 Headache, unspecified: Secondary | ICD-10-CM

## 2016-06-25 DIAGNOSIS — R51 Headache: Secondary | ICD-10-CM

## 2016-06-25 DIAGNOSIS — C182 Malignant neoplasm of ascending colon: Secondary | ICD-10-CM

## 2016-07-01 ENCOUNTER — Ambulatory Visit (HOSPITAL_COMMUNITY)
Admission: RE | Admit: 2016-07-01 | Discharge: 2016-07-01 | Disposition: A | Discharge status: Home / Self Care | Payer: Medicare Other | Source: Ambulatory Visit | Attending: Hematology & Oncology | Admitting: Hematology & Oncology

## 2016-07-01 DIAGNOSIS — Z8546 Personal history of malignant neoplasm of prostate: Secondary | ICD-10-CM | POA: Diagnosis not present

## 2016-07-01 DIAGNOSIS — C182 Malignant neoplasm of ascending colon: Secondary | ICD-10-CM | POA: Insufficient documentation

## 2016-07-01 DIAGNOSIS — R519 Headache, unspecified: Secondary | ICD-10-CM

## 2016-07-01 DIAGNOSIS — R51 Headache: Secondary | ICD-10-CM | POA: Diagnosis present

## 2016-07-01 DIAGNOSIS — C9110 Chronic lymphocytic leukemia of B-cell type not having achieved remission: Secondary | ICD-10-CM | POA: Insufficient documentation

## 2016-07-01 MED ORDER — IOPAMIDOL (ISOVUE-300) INJECTION 61%
75.0000 mL | Freq: Once | INTRAVENOUS | Status: AC | PRN
  Administered 2016-07-01: 75 mL via INTRAVENOUS

## 2016-07-04 ENCOUNTER — Encounter (HOSPITAL_COMMUNITY): Payer: Self-pay | Admitting: Hematology & Oncology

## 2016-08-19 ENCOUNTER — Other Ambulatory Visit: Payer: Self-pay | Admitting: Cardiovascular Disease

## 2016-09-15 ENCOUNTER — Ambulatory Visit (HOSPITAL_COMMUNITY): Payer: Medicare Other | Admitting: Hematology & Oncology

## 2016-09-15 ENCOUNTER — Other Ambulatory Visit (HOSPITAL_COMMUNITY): Payer: Medicare Other

## 2016-09-20 ENCOUNTER — Other Ambulatory Visit (HOSPITAL_COMMUNITY): Payer: Self-pay | Admitting: Hematology & Oncology

## 2016-09-20 DIAGNOSIS — E559 Vitamin D deficiency, unspecified: Secondary | ICD-10-CM

## 2016-09-27 ENCOUNTER — Encounter (HOSPITAL_COMMUNITY): Payer: Medicare Other | Attending: Hematology & Oncology

## 2016-09-27 DIAGNOSIS — C9110 Chronic lymphocytic leukemia of B-cell type not having achieved remission: Secondary | ICD-10-CM | POA: Insufficient documentation

## 2016-09-27 LAB — CBC WITH DIFFERENTIAL/PLATELET
BASOS PCT: 0 %
Basophils Absolute: 0 10*3/uL (ref 0.0–0.1)
EOS ABS: 0.9 10*3/uL — AB (ref 0.0–0.7)
EOS PCT: 6 %
HEMATOCRIT: 45.8 % (ref 39.0–52.0)
HEMOGLOBIN: 15.4 g/dL (ref 13.0–17.0)
Lymphocytes Relative: 40 %
Lymphs Abs: 5.8 10*3/uL — ABNORMAL HIGH (ref 0.7–4.0)
MCH: 31.2 pg (ref 26.0–34.0)
MCHC: 33.6 g/dL (ref 30.0–36.0)
MCV: 92.9 fL (ref 78.0–100.0)
Monocytes Absolute: 1.3 10*3/uL — ABNORMAL HIGH (ref 0.1–1.0)
Monocytes Relative: 9 %
NEUTROS ABS: 6.5 10*3/uL (ref 1.7–7.7)
Neutrophils Relative %: 45 %
Platelets: 326 10*3/uL (ref 150–400)
RBC: 4.93 MIL/uL (ref 4.22–5.81)
RDW: 12.6 % (ref 11.5–15.5)
WBC: 14.5 10*3/uL — ABNORMAL HIGH (ref 4.0–10.5)

## 2016-09-27 LAB — COMPREHENSIVE METABOLIC PANEL
ALK PHOS: 51 U/L (ref 38–126)
ALT: 21 U/L (ref 17–63)
ANION GAP: 6 (ref 5–15)
AST: 23 U/L (ref 15–41)
Albumin: 3.7 g/dL (ref 3.5–5.0)
BILIRUBIN TOTAL: 0.9 mg/dL (ref 0.3–1.2)
BUN: 15 mg/dL (ref 6–20)
CALCIUM: 8.9 mg/dL (ref 8.9–10.3)
CO2: 27 mmol/L (ref 22–32)
CREATININE: 1.21 mg/dL (ref 0.61–1.24)
Chloride: 106 mmol/L (ref 101–111)
GFR, EST NON AFRICAN AMERICAN: 55 mL/min — AB (ref >60)
Glucose, Bld: 90 mg/dL (ref 65–99)
Potassium: 4.1 mmol/L (ref 3.5–5.1)
SODIUM: 139 mmol/L (ref 135–145)
TOTAL PROTEIN: 6.6 g/dL (ref 6.5–8.1)

## 2016-09-27 LAB — FERRITIN: FERRITIN: 71 ng/mL (ref 24–336)

## 2016-09-30 ENCOUNTER — Encounter (HOSPITAL_COMMUNITY): Payer: Self-pay

## 2016-09-30 ENCOUNTER — Other Ambulatory Visit (HOSPITAL_COMMUNITY): Payer: Medicare Other

## 2016-09-30 ENCOUNTER — Encounter (HOSPITAL_COMMUNITY): Payer: Medicare Other | Attending: Oncology | Admitting: Oncology

## 2016-09-30 VITALS — BP 147/79 | HR 62 | Temp 98.5°F | Resp 18 | Wt 230.3 lb

## 2016-09-30 DIAGNOSIS — D72829 Elevated white blood cell count, unspecified: Secondary | ICD-10-CM | POA: Diagnosis not present

## 2016-09-30 DIAGNOSIS — C182 Malignant neoplasm of ascending colon: Secondary | ICD-10-CM | POA: Diagnosis not present

## 2016-09-30 DIAGNOSIS — E611 Iron deficiency: Secondary | ICD-10-CM | POA: Diagnosis not present

## 2016-09-30 DIAGNOSIS — C9110 Chronic lymphocytic leukemia of B-cell type not having achieved remission: Secondary | ICD-10-CM

## 2016-09-30 DIAGNOSIS — Z8546 Personal history of malignant neoplasm of prostate: Secondary | ICD-10-CM

## 2016-09-30 NOTE — Progress Notes (Signed)
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 R hemicolectomy. Inability to tolerate adjuvant XELODA 02/29/2012 History of prostate cancer. TURP X 2, XRT at [facility] in 2011  Chronic lymphocytic leukemia, flow cytometry on 12/13/2014 with a small monoclonal B-cell population CD5+, CD23+ RAI clinical stage 0 CT C/A/P 06/13/2014 no findings to suggest recurrent or metastatic disease in the abdomen/pelvis Colonoscopy 05/09/15    Cancer of ascending colon ([place])   02/29/2012 Procedure    laparoscopic assisted R colectomy      03/09/2012 Miscellaneous    Iron deficiency and folic acid deficiency      03/29/2012 Initial Diagnosis    Cancer of ascending colon, CEA on 01/19/2012 was WNL      08/02/2013 Procedure    Ileocolonoscopy with snare polypectomy      06/11/2014 Imaging    CT abdomen/pelvis with no findings to suggest metastatic disease. Diverticulosis      06/11/2016 Imaging    CT Abdomen/Pelvis with no findings to suggest recurrent or metastatic disease. Prostatomegaly.         CURRENT THERAPY: Observation   INTERVAL HISTORY: David Pugh 82 y.o. male with a history of CAD, Stage III CRC, prostate cancer and CLL who is doing well. He is here today for regular follow-up of his CLL, stage III CRC and prostate cancer. He is up to date with his screening colonoscopy.    Mr. Exner is accompanied by his wife. I personally reviewed and went over imaging and laboratory studies with the patient.   Patient presents today for continued follow up.  States he is doing well except for his chronic frontal headaches. Patient thinks its related to possible nerve compression due to a tooth implant that was placed right before the headaches started.  Otherwise patient states he is doing well, no recent infections.  Denies changes in bowel movements, melena, hematochezia.  Patient states occasional mild  bleeding from hemorrhoids due to constipation.   Patient has no other concerns or complains at this time.       Past Medical History:  Diagnosis Date  . Abnormality of gait 12/18/2013  . Anemia    hx of anemia as a child   . Arthritis   . Biliary dyskinesia   . CAD (coronary artery disease) 2012   a. hx of stent x2 in ramus/OM and mid LAD in 2012  b. balloon angioplaty of OM  . Colon cancer ([place]) 12/2011   s/p right hemicolectomy, did not tolerate chemo  . Colon polyps    tubular adenomas  . Diverticula of colon 2010  . Exposure to [employer]   . Family history of prostate cancer   . Family history of stomach cancer   . Folic acid deficiency   . Gastroparesis 04/12/2013   borderline  . GERD (gastroesophageal reflux disease)   . Glaucoma   . Heart palpitations 10/2012  . Hiatal hernia 2010   tiny  . HTN (hypertension)   . Iron deficiency 05/02/2012   followed by Dr. Tressie [name], iron infusions.   . Myocardial infarction 2012  . Peripheral neuropathy ([place])   . Prostate cancer [facility]) 2010   s/p radiation, surgery  . PTSD (post-traumatic stress disorder)   . Radiation Feb 2011   treatment  . Radiation proctitis   . Rosacea     has PREMATURE VENTRICULAR CONTRACTIONS; GERD; Cancer of ascending colon ([place]); Iron deficiency; Hx  of adenomatous colonic polyps; Folic acid deficiency; Peripheral neuropathy ([place]); CAD (coronary artery disease); HTN (hypertension); Dyslipidemia; Viral gastroenteritis; Abnormality of gait; Shingles outbreak, right anterior thoracic; Unstable angina ([place]); H/O agent Orange exposure; Radiation proctitis; H/O prostate cancer; Family history of prostate cancer; Family history of colon cancer; Family history of stomach cancer; Mucosal abnormality of esophagus; Mucosal abnormality of stomach; Multiple gastric polyps; History of colon cancer; Diverticulosis of colon without hemorrhage; CLL (chronic lymphocytic leukemia) ([place]); Vitamin D deficiency; and Benign  prostatic hyperplasia with urinary obstruction on his problem list.    Social History   Social History  . Marital status: Married    Spouse name: N/A  . Number of children: 3  . Years of education: military   Occupational History  . Retired   .  Retired   Social History Main Topics  . Smoking status: Never Smoker  . Smokeless tobacco: Never Used  . Alcohol use Yes     Comment: 1 cocktail/month  . Drug use: No  . Sexual activity: Yes    Birth control/ protection: None   Other Topics Concern  . None   Social History Narrative  . None   3 tumors in [place]  Family History  Problem Relation Age of Onset  . Throat cancer Father     dx in his 61s and again in his 28s; pipe and cigar smoker  . Neuropathy Brother   . Prostate cancer Brother 71  . Melanoma Brother     dx in his 72s  . Cancer Paternal Uncle     smoking related cancer  . Heart attack Paternal Grandfather   . Stomach cancer Cousin 32  . Colon cancer Neg Hx      is allergic to shellfish allergy; sulfa antibiotics; and sulfonamide derivatives.  Mr. Garbers does not currently have medications on file.  Past Surgical History:  Procedure Laterality Date  . CARDIAC CATHETERIZATION  02/13/2011   [facility], [facility] cardiology  . CATARACT EXTRACTION     2011  . CATARACT EXTRACTION W/PHACO Right 08/06/2013   Procedure: CATARACT EXTRACTION PHACO AND INTRAOCULAR LENS PLACEMENT (IOC);  Surgeon: Tonny Branch, MD;  Location: [facility] ORS;  Service: Ophthalmology;  Laterality: Right;  CDE:20.96  . CHOLECYSTECTOMY    . COLONOSCOPY  02/12/2004   Dr. Gala Romney- L side diverticula, inflammatory  colon polyps  . COLONOSCOPY  01/19/2012   RMR: Prominent changes involving the rectal mucosa consistent with radiation-induced proctitis. Multiple colonic  polyps removed as described above. Sigmoid Diverticulosis/ 1.5 x 2 cm relatively flat ulcerated lesion in cecum/path showed adenocarcinoma of the colon. descending colon polyp with tubular  adenoma and one fragment of focal high grade dysplasia.   . COLONOSCOPY  08/28/2012   [id number] proctitis. Colonic diverticulosis/Ulcerations at the surgical anastomosis  path: ulcerated colonic mucosa with prolapse changes, tubular adenoma.   . COLONOSCOPY N/A 08/02/2013   RMR: Colonic polyps -removed as described above. Colonic diverticulosis. Status post reight hemicolectomy. Radiation proctitis- asymptomatic.   Marland Kitchen COLONOSCOPY N/A 05/09/2015   Procedure: COLONOSCOPY;  Surgeon: Daneil Dolin, MD;  Location: [facility] ENDO SUITE;  Service: Endoscopy;  Laterality: N/A;  1200-moved to [address]5 [address] to notify pt  . CORONARY ANGIOPLASTY  06/17/14   OM1 POBA  . CORONARY STENT PLACEMENT  01/2011   2.5x42mm Xience drug-eluting stent in ramus intermedius/OMI vessel, 2.5x23mm Promus Element stent in mid LAD artery  . ESOPHAGOGASTRODUODENOSCOPY  10/10/2008   Dr. Tilda Burrow hiatal hernia, normal esophagus, normal stomach  . ESOPHAGOGASTRODUODENOSCOPY  02/12/2004  Dr. Dudley Major- normal   . ESOPHAGOGASTRODUODENOSCOPY N/A 05/09/2015   Procedure: ESOPHAGOGASTRODUODENOSCOPY (EGD);  Surgeon: Daneil Dolin, MD;  Location: [facility] END[facility];  Service: Endoscopy;  Laterality: N/A;  . GIVENS CAPSULE STUDY N/A 06/03/2015   Procedure: GIVENS CAPSULE STUDY;  Surgeon: Daneil Dolin, MD;  Location: [facility] ENDO SU[facility];  Service: Endoscopy;  Laterality: N/A;  0700  . KNEE SURGERY     left knee   . LEFT HEART CATHETERIZATION WITH CORONARY ANGIOGRAM N/A 06/17/2014   Procedure: LEFT HEART CATHETERIZATION WITH CORONARY ANGIOGRAM;  Surgeon: Burnell Blanks, MD;  Location: [facility] CATH LAB;  Service: Cardiovascular;  Laterality: N/A;  . PARTIAL COLECTOMY  02/29/2012   Procedure: PARTIAL COLECTOMY;  Surgeon: Adin Hector, MD;  Location: [facility] ORS;  Service: General;  Laterality: Right;  . PROSTATECTOMY    . TONSILLECTOMY    . TRANSURETHRAL RESECTION OF PROSTATE    . VASECTOMY      Review of Systems  Constitutional: Negative.   HENT:  Negative.   Eyes: Negative.   Respiratory: Negative.        Denies hematochezia  Cardiovascular: Negative.   Gastrointestinal: Positive for constipation (mild bleeding from hemorrhoids). Negative for melena.  Genitourinary: Negative.   Skin: Negative.   Neurological: Positive for headaches (chronic frontal headaches).  Endo/Heme/Allergies: Negative.   Psychiatric/Behavioral: Negative.   All other systems reviewed and are negative.   14 point review of systems was performed and is negative except as detailed under history of present illness and above   PHYSICAL EXAMINATION  ECOG PERFORMANCE STATUS: 0 - Asymptomatic  Vitals:   09/30/16 0817  BP: (!) 147/79  Pulse: 62  Resp: 18  Temp: 98.5 F (36.9 C)      Physical Exam  Constitutional: He is oriented to person, place, and time and well-developed, well-nourished, and in no distress.  HENT:  Head: Normocephalic and atraumatic.  Nose: Nose normal.  Mouth/Throat: Oropharynx is clear and moist. No oropharyngeal exudate.  Eyes: Conjunctivae and EOM are normal. Pupils are equal, round, and reactive to light. Right eye exhibits no discharge. Left eye exhibits no discharge. No scleral icterus.  Neck: Normal range of motion. Neck supple. No tracheal deviation present. No thyromegaly present.  Cardiovascular: Normal rate, regular rhythm and normal heart sounds.  Exam reveals no gallop and no friction rub.   No murmur heard. Pulmonary/Chest: Effort normal and breath sounds normal. He has no wheezes. He has no rales.  Abdominal: Soft. Bowel sounds are normal. He exhibits no distension and no mass. There is no tenderness. There is no rebound and no guarding.  Musculoskeletal: Normal range of motion. He exhibits no edema.  Lymphadenopathy:    He has no cervical adenopathy.  Neurological: He is alert and oriented to person, place, and time. He has normal reflexes. No cranial nerve deficit. Gait normal. Coordination normal.  Skin: Skin is  warm and dry. No rash noted.  Psychiatric: Mood, memory, affect and judgment normal.  Nursing note and vitals reviewed.    LABORATORY DATA: I have reviewed the data as listed. CBC    Component Value Date/Time   WBC 14.5 (H) 09/27/2016 0929   RBC 4.93 09/27/2016 0929   HGB 15.4 09/27/2016 0929   HCT 45.8 09/27/2016 0929   PLT 326 09/27/2016 0929   MCV 92.9 09/27/2016 0929   MCH 31.2 09/27/2016 0929   MCHC 33.6 09/27/2016 0929   RDW 12.6 09/27/2016 0929   LYMPHSABS 5.8 (H) 09/27/2016 0929   MONOABS 1.3 (  H) 09/27/2016 0929   EOSABS 0.9 (H) 09/27/2016 0929   BASOSABS 0.0 09/27/2016 0929    CMP     Component Value Date/Time   NA 139 09/27/2016 0929   K 4.1 09/27/2016 0929   CL 106 09/27/2016 0929   CO2 27 09/27/2016 0929   GLUCOSE 90 09/27/2016 0929   BUN 15 09/27/2016 0929   CREATININE 1.21 09/27/2016 0929   CREATININE 1.20 (H) 05/05/2016 0840   CALCIUM 8.9 09/27/2016 0929   PROT 6.6 09/27/2016 0929   PROT 6.4 12/18/2013 0941   ALBUMIN 3.7 09/27/2016 0929   AST 23 09/27/2016 0929   ALT 21 09/27/2016 0929   ALKPHOS 51 09/27/2016 0929   BILITOT 0.9 09/27/2016 0929   GFRNONAA 55 (L) 09/27/2016 0929   GFRAA >60 09/27/2016 0929   RADIOLOGY: I have reviewed the images below and agree with the reported results Study Result   CT HEAD WITHOUT AND WITH CONTRAST 07/01/2016  IMPRESSION: No acute intracranial abnormality. No abnormal enhancement. Unremarkable CT of head for age.   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 R hemicolectomy. Inability to tolerate adjuvant XELODA 02/29/2012 History of prostate cancer. TURP X 2, XRT at [facility] in 2011  Chronic lymphocytic leukemia, flow cytometry on 12/13/2014 with a small monoclonal B-cell population CD5+, CD23+ RAI clinical stage 0 Iron deficiency Hypocalcemia  I personally reviewed and went over laboratory studies with the patient at length. CBC is stable.   NCCN guidelines for surveillance for Colon  cancer are as follows (1.2017):  B. Stage II, Stage III 1. H+P every 3-6 months x 2 years and then every 6 months for a total of 5 years  2. CEA every 3-6 months x 2 years and then every 6 months for a total of 5 years  3. CT CAP every 6-12 months (category 2B for frequency < 12 months) for a total of 5 years . 4.  Colonoscopy in 1 year except if no preoperative colonoscopy due to obstructing lesion, colonoscopy in 3-6 months.  A. If advanced adenoma, repeat in 1 year B. If no advanced adenoma, repeat in 3 years, then every 5 years 5. PET/CT scan is not recommended   Continue observation at this time, clinically NED from his prostate cancer and colon cancer standpoint.  Patient will be due for surveillance colonoscopy this year.  Perform annual surveillance CT in October 2018.  CLL is stable. Has leukocytosis but no evidence of worsening anemia or thrombocytopenia. No indication to initiate treatment at this time.   RTC in 6 months for follow up with CBC, CMP CEA, PSA.     Orders Placed This Encounter  Procedures  . CBC with Differential    Standing Status:   Future    Standing Expiration Date:   05/30/2017  . Comprehensive metabolic panel    Standing Status:   Future    Standing Expiration Date:   05/30/2017  . CEA    Standing Status:   Future    Standing Expiration Date:   05/30/2017    This document serves as a record of services personally performed by Twana First, MD. It was created on her behalf by Shirlean Mylar, a trained medical scribe. The creation of this record is based on the scribe's personal observations and the provider's statements to them. This document has been checked and approved by the attending provider.   This note was electronically signed.  I have reviewed the above documentation for accuracy and completeness  and I agree with the above.

## 2016-09-30 NOTE — Patient Instructions (Signed)
[place] at [facility] Discharge Instructions  RECOMMENDATIONS MADE BY THE CONSULTANT AND ANY TEST RESULTS WILL BE SENT TO YOUR REFERRING PHYSICIAN.  You were seen today by Dr. Barron Schmid Follow up in 6 months with lab work  Thank you for choosing [facility] at [facility] to provide your oncology and hematology care.  To afford each patient quality time with our provider, please arrive at least 15 minutes before your scheduled appointment time.    If you have a lab appointment with the [facility] please come in thru the  Main Entrance and check in at the main information desk  You need to re-schedule your appointment should you arrive 10 or more minutes late.  We strive to give you quality time with our providers, and arriving late affects you and other patients whose appointments are after yours.  Also, if you no show three or more times for appointments you may be dismissed from the clinic at the providers discretion.     Again, thank you for choosing [facility].  Our hope is that these requests will decrease the amount of time that you wait before being seen by our physicians.       _____________________________________________________________  Should you have questions after your visit to [facility], please contact our office at [phone] between the hours of 8:30 a.m. and 4:30 p.m.  Voicemails left after 4:30 p.m. will not be returned until the following business day.  For prescription refill requests, have your pharmacy contact our office.       Resources For Cancer Patients and their Caregivers ? American Cancer Society: Can assist with transportation, wigs, general needs, runs Look Good Feel Better.        (509)716-9918 ? Cancer Care: Provides financial assistance, online support groups, medication/co-pay assistance.  1-800-813-HOPE 506-262-9638) ? [facility] Assists  [place] cancer patients and their families through emotional , educational and financial support.  972 732 2373 ? [place] DSS Where to apply for food stamps, Medicaid and utility assistance. 905-556-9896 ? [facility]: Transportation to medical appointments. (514)627-9055 ? Social Security Administration: May apply for disability if have a Stage IV cancer. (832)357-2022 760-091-5474 ? [employer], Disability and Transit Services: Assists with nutrition, care and transit needs. [place] Support Programs: @10RELATIVEDAYS @ > Cancer Support Group  2nd Tuesday of the month 1pm-2pm, Journey Room  > Creative Journey  3rd Tuesday of the month 1130am-1pm, Journey Room  > Look Good Feel Better  1st Wednesday of the month 10am-12 noon, Journey Room (Call [place] to register [phone])

## 2016-10-16 ENCOUNTER — Other Ambulatory Visit: Payer: Self-pay | Admitting: Cardiovascular Disease

## 2016-10-19 NOTE — Telephone Encounter (Signed)
Rx(s) sent to pharmacy electronically.  

## 2016-11-18 ENCOUNTER — Other Ambulatory Visit: Payer: Self-pay | Admitting: Cardiovascular Disease

## 2016-11-18 ENCOUNTER — Other Ambulatory Visit (HOSPITAL_COMMUNITY): Payer: Self-pay | Admitting: Oncology

## 2016-11-19 NOTE — Telephone Encounter (Signed)
REFILL 

## 2017-01-19 ENCOUNTER — Other Ambulatory Visit (HOSPITAL_COMMUNITY): Payer: Self-pay | Admitting: Hematology & Oncology

## 2017-01-19 DIAGNOSIS — E559 Vitamin D deficiency, unspecified: Secondary | ICD-10-CM

## 2017-02-03 ENCOUNTER — Other Ambulatory Visit (HOSPITAL_COMMUNITY): Payer: Self-pay | Admitting: Oncology

## 2017-02-03 DIAGNOSIS — E559 Vitamin D deficiency, unspecified: Secondary | ICD-10-CM

## 2017-02-15 ENCOUNTER — Other Ambulatory Visit: Payer: Self-pay | Admitting: Cardiovascular Disease

## 2017-03-29 ENCOUNTER — Encounter (HOSPITAL_COMMUNITY): Payer: Medicare Other | Attending: Oncology

## 2017-03-29 DIAGNOSIS — C182 Malignant neoplasm of ascending colon: Secondary | ICD-10-CM | POA: Diagnosis not present

## 2017-03-29 DIAGNOSIS — Z8546 Personal history of malignant neoplasm of prostate: Secondary | ICD-10-CM | POA: Diagnosis not present

## 2017-03-29 LAB — CBC WITH DIFFERENTIAL/PLATELET
BASOS PCT: 0 %
Basophils Absolute: 0 10*3/uL (ref 0.0–0.1)
EOS PCT: 6 %
Eosinophils Absolute: 0.8 10*3/uL — ABNORMAL HIGH (ref 0.0–0.7)
HEMATOCRIT: 47 % (ref 39.0–52.0)
Hemoglobin: 15.7 g/dL (ref 13.0–17.0)
LYMPHS ABS: 5.2 10*3/uL — AB (ref 0.7–4.0)
Lymphocytes Relative: 40 %
MCH: 31 pg (ref 26.0–34.0)
MCHC: 33.4 g/dL (ref 30.0–36.0)
MCV: 92.7 fL (ref 78.0–100.0)
MONOS PCT: 8 %
Monocytes Absolute: 1 10*3/uL (ref 0.1–1.0)
NEUTROS ABS: 6 10*3/uL (ref 1.7–7.7)
Neutrophils Relative %: 46 %
Platelets: 333 10*3/uL (ref 150–400)
RBC: 5.07 MIL/uL (ref 4.22–5.81)
RDW: 12.8 % (ref 11.5–15.5)
WBC: 13 10*3/uL — ABNORMAL HIGH (ref 4.0–10.5)

## 2017-03-29 LAB — COMPREHENSIVE METABOLIC PANEL
ALBUMIN: 4 g/dL (ref 3.5–5.0)
ALK PHOS: 56 U/L (ref 38–126)
ALT: 21 U/L (ref 17–63)
AST: 24 U/L (ref 15–41)
Anion gap: 6 (ref 5–15)
BILIRUBIN TOTAL: 1.2 mg/dL (ref 0.3–1.2)
BUN: 15 mg/dL (ref 6–20)
CO2: 27 mmol/L (ref 22–32)
Calcium: 9 mg/dL (ref 8.9–10.3)
Chloride: 109 mmol/L (ref 101–111)
Creatinine, Ser: 1.35 mg/dL — ABNORMAL HIGH (ref 0.61–1.24)
GFR calc Af Amer: 56 mL/min — ABNORMAL LOW (ref >60)
GFR calc non Af Amer: 48 mL/min — ABNORMAL LOW (ref >60)
GLUCOSE: 107 mg/dL — AB (ref 65–99)
POTASSIUM: 4.4 mmol/L (ref 3.5–5.1)
Sodium: 142 mmol/L (ref 135–145)
TOTAL PROTEIN: 6.8 g/dL (ref 6.5–8.1)

## 2017-03-29 LAB — PSA: Prostatic Specific Antigen: 0.19 ng/mL (ref 0.00–4.00)

## 2017-03-30 LAB — CEA: CEA1: 1.9 ng/mL (ref 0.0–4.7)

## 2017-03-31 ENCOUNTER — Encounter (HOSPITAL_COMMUNITY): Payer: Self-pay | Admitting: Oncology

## 2017-03-31 ENCOUNTER — Encounter (HOSPITAL_COMMUNITY): Payer: Medicare Other | Attending: Oncology | Admitting: Oncology

## 2017-03-31 VITALS — BP 130/61 | HR 60 | Resp 18 | Ht 70.5 in | Wt 228.0 lb

## 2017-03-31 DIAGNOSIS — Z85038 Personal history of other malignant neoplasm of large intestine: Secondary | ICD-10-CM | POA: Diagnosis not present

## 2017-03-31 DIAGNOSIS — Z8546 Personal history of malignant neoplasm of prostate: Secondary | ICD-10-CM | POA: Insufficient documentation

## 2017-03-31 DIAGNOSIS — C9110 Chronic lymphocytic leukemia of B-cell type not having achieved remission: Secondary | ICD-10-CM

## 2017-03-31 DIAGNOSIS — G629 Polyneuropathy, unspecified: Secondary | ICD-10-CM | POA: Diagnosis not present

## 2017-03-31 DIAGNOSIS — R319 Hematuria, unspecified: Secondary | ICD-10-CM

## 2017-03-31 DIAGNOSIS — D72829 Elevated white blood cell count, unspecified: Secondary | ICD-10-CM

## 2017-03-31 DIAGNOSIS — C182 Malignant neoplasm of ascending colon: Secondary | ICD-10-CM | POA: Insufficient documentation

## 2017-03-31 NOTE — Progress Notes (Signed)
Progress Note  David Pugh., MD [address] 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 R hemicolectomy. Inability to tolerate adjuvant XELODA 02/29/2012 History of prostate cancer. TURP X 2, XRT at [facility] in 2011  Chronic lymphocytic leukemia, flow cytometry on 12/13/2014 with a small monoclonal B-cell population CD5+, CD23+ RAI clinical stage 0 CT C/A/P 06/13/2014 no findings to suggest recurrent or metastatic disease in the abdomen/pelvis Colonoscopy 05/09/15    Cancer of ascending colon ([place])   02/29/2012 Procedure    laparoscopic assisted R colectomy      03/09/2012 Miscellaneous    Iron deficiency and folic acid deficiency      03/29/2012 Initial Diagnosis    Cancer of ascending colon, CEA on 01/19/2012 was WNL      08/02/2013 Procedure    Ileocolonoscopy with snare polypectomy      06/11/2014 Imaging    CT abdomen/pelvis with no findings to suggest metastatic disease. Diverticulosis      06/11/2016 Imaging    CT Abdomen/Pelvis with no findings to suggest recurrent or metastatic disease. Prostatomegaly.         CURRENT THERAPY: Observation   INTERVAL HISTORY: David Pugh 82 y.o. male with a history of CAD, Stage III CRC, prostate cancer and CLL who is doing well. He is here today for regular follow-up of his CLL, stage III CRC and prostate cancer. He is up to date with his screening colonoscopy.    David Pugh is accompanied by his wife. I personally reviewed and went over imaging and laboratory studies with the patient.   Patient presents today for continued follow up.  States he is doing well Except that he has been having hematuria about 4-5 episodes over the past 3 weeks. He has an appointment with his urologist, Dr. Nevada Crane on 04/13/17 for cystoscopy.  He states he is also been having problems with his balance due to neuropathy in his feet. He fell in his bedroom yesterday when he stumbled over his own feet.  Patient  denies any chest pain, shortness breath, abdominal pain, focal weakness. He denies any melena or hematochezia.      Past Medical History:  Diagnosis Date  . Abnormality of gait 12/18/2013  . Anemia    hx of anemia as a child   . Arthritis   . Biliary dyskinesia   . CAD (coronary artery disease) 2012   a. hx of stent x2 in ramus/OM and mid LAD in 2012  b. balloon angioplaty of OM  . Colon cancer ([place]) 12/2011   s/p right hemicolectomy, did not tolerate chemo  . Colon polyps    tubular adenomas  . Diverticula of colon 2010  . Exposure to [employer]   . Family history of prostate cancer   . Family history of stomach cancer   . Folic acid deficiency   . Gastroparesis 04/12/2013   borderline  . GERD (gastroesophageal reflux disease)   . Glaucoma   . Heart palpitations 10/2012  . Hiatal hernia 2010   tiny  . HTN (hypertension)   . Iron deficiency 05/02/2012   followed by Dr. Tressie [name], iron infusions.   . Myocardial infarction ([place]) 2012  . Peripheral neuropathy   . Prostate cancer [facility]) 2010   s/p radiation, surgery  . PTSD (post-traumatic stress disorder)   . Radiation Feb 2011   treatment  . Radiation proctitis   . Rosacea     has PREMATURE VENTRICULAR CONTRACTIONS; GERD;  Cancer of ascending colon ([place]); Iron deficiency; Hx of adenomatous colonic polyps; Folic acid deficiency; Peripheral neuropathy; CAD (coronary artery disease); HTN (hypertension); Dyslipidemia; Viral gastroenteritis; Abnormality of gait; Shingles outbreak, right anterior thoracic; Unstable angina ([place]); H/O agent Orange exposure; Radiation proctitis; H/O prostate cancer; Family history of prostate cancer; Family history of colon cancer; Family history of stomach cancer; Mucosal abnormality of esophagus; Mucosal abnormality of stomach; Multiple gastric polyps; History of colon cancer; Diverticulosis of colon without hemorrhage; CLL (chronic lymphocytic leukemia) ([place]); Vitamin D deficiency; and Benign prostatic  hyperplasia with urinary obstruction on his problem list.    Social History   Social History  . Marital status: Married    Spouse name: N/A  . Number of children: 3  . Years of education: military   Occupational History  . Retired   .  Retired   Social History Main Topics  . Smoking status: Never Smoker  . Smokeless tobacco: Never Used  . Alcohol use Yes     Comment: 1 cocktail/month  . Drug use: No  . Sexual activity: Yes    Birth control/ protection: None   Other Topics Concern  . None   Social History Narrative  . None   3 tumors in [place]  Family History  Problem Relation Age of Onset  . Throat cancer Father        dx in his 29s and again in his 48s; pipe and cigar smoker  . Neuropathy Brother   . Prostate cancer Brother 58  . Melanoma Brother        dx in his 59s  . Cancer Paternal Uncle        smoking related cancer  . Heart attack Paternal Grandfather   . Stomach cancer Cousin 69  . Colon cancer Neg Hx      is allergic to shellfish allergy; sulfa antibiotics; and sulfonamide derivatives.  David Pugh had no medications administered during this visit.  Past Surgical History:  Procedure Laterality Date  . CARDIAC CATHETERIZATION  02/13/2011   [facility] cardiology  . CATARACT EXTRACTION     2011  . CATARACT EXTRACTION W/PHACO Right 08/06/2013   Procedure: CATARACT EXTRACTION PHACO AND INTRAOCULAR LENS PLACEMENT (IOC);  Surgeon: Tonny Branch, MD;  Location: [facility] ORS;  Service: Ophthalmology;  Laterality: Right;  CDE:20.96  . CHOLECYSTECTOMY    . COLONOSCOPY  02/12/2004   Dr. Gala Romney- L side diverticula, inflammatory  colon polyps  . COLONOSCOPY  01/19/2012   RMR: Prominent changes involving the rectal mucosa consistent with radiation-induced proctitis. Multiple colonic  polyps removed as described above. Sigmoid Diverticulosis/ 1.5 x 2 cm relatively flat ulcerated lesion in cecum/path showed adenocarcinoma of the colon. descending colon polyp with  tubular adenoma and one fragment of focal high grade dysplasia.   . COLONOSCOPY  08/28/2012   ION:[id number] proctitis. Colonic diverticulosis/Ulcerations at the surgical anastomosis  path: ulcerated colonic mucosa with prolapse changes, tubular adenoma.   . COLONOSCOPY N/A 08/02/2013   RMR: Colonic polyps -removed as described above. Colonic diverticulosis. Status post reight hemicolectomy. Radiation proctitis- asymptomatic.   Marland Kitchen COLONOSCOPY N/A 05/09/2015   Procedure: COLONOSCOPY;  Surgeon: Daneil Dolin, MD;  Location: [facility] ENDO SUITE;  Service: Endoscopy;  Laterality: N/A;  1200-moved to [address] to notify pt  . CORONARY ANGIOPLASTY  06/17/14   OM1 POBA  . CORONARY STENT PLACEMENT  01/2011   2.5x25mm Xience drug-eluting stent in ramus intermedius/OMI vessel, 2.5x63mm Promus Element stent in mid LAD artery  . ESOPHAGOGASTRODUODENOSCOPY  10/10/2008  Dr. Tilda Burrow hiatal hernia, normal esophagus, normal stomach  . ESOPHAGOGASTRODUODENOSCOPY  02/12/2004   Dr. Dudley Major- normal   . ESOPHAGOGASTRODUODENOSCOPY N/A 05/09/2015   Procedure: ESOPHAGOGASTRODUODENOSCOPY (EGD);  Surgeon: Daneil Dolin, MD;  Location: [facility] END[facility] SU[facility];  Service: Endoscopy;  Laterality: N/A;  . GIVENS CAPSULE STUDY N/A 06/03/2015   Procedure: GIVENS CAPSULE STUDY;  Surgeon: Daneil Dolin, MD;  Location: [facility] END[facility] SUITE;  Service: Endoscopy;  Laterality: N/A;  0700  . KNEE SURGERY     left knee   . LEFT HEART CATHETERIZATION WITH CORONARY ANGIOGRAM N/A 06/17/2014   Procedure: LEFT HEART CATHETERIZATION WITH CORONARY ANGIOGRAM;  Surgeon: Burnell Blanks, MD;  Location[facility] CATH LAB;  Service: Cardiovascular;  Laterality: N/A;  . PARTIAL COLECTOMY  02/29/2012   Procedure: PARTIAL COLECTOMY;  Surgeon: Adin Hector, MD;  Location: [facility] ORS;  Service: General;  Laterality: Right;  . PROSTATECTOMY    . TONSILLECTOMY    . TRANSURETHRAL RESECTION OF PROSTATE    . VASECTOMY      Review of Systems  Constitutional: Negative.   HENT:  Negative.   Eyes: Negative.   Respiratory: Negative.        Denies hematochezia  Cardiovascular: Negative.   Gastrointestinal: Negative for constipation and melena.  Genitourinary: Positive for hematuria.  Skin: Negative.   Endo/Heme/Allergies: Negative.   Psychiatric/Behavioral: Negative.   All other systems reviewed and are negative.   14 point review of systems was performed and is negative except as detailed under history of present illness and above   PHYSICAL EXAMINATION  ECOG PERFORMANCE STATUS: 0 - Asymptomatic  Vitals:   03/31/17 1110  BP: 130/61  Pulse: 60  Resp: 18      Physical Exam  Constitutional: He is oriented to person, place, and time and well-developed, well-nourished, and in no distress.  HENT:  Head: Normocephalic and atraumatic.  Nose: Nose normal.  Mouth/Throat: Oropharynx is clear and moist. No oropharyngeal exudate.  Eyes: Pupils are equal, round, and reactive to light. Conjunctivae and EOM are normal. Right eye exhibits no discharge. Left eye exhibits no discharge. No scleral icterus.  Neck: Normal range of motion. Neck supple. No tracheal deviation present. No thyromegaly present.  Cardiovascular: Normal rate, regular rhythm and normal heart sounds.  Exam reveals no gallop and no friction rub.   No murmur heard. Pulmonary/Chest: Effort normal and breath sounds normal. He has no wheezes. He has no rales.  Abdominal: Soft. Bowel sounds are normal. He exhibits no distension and no mass. There is no tenderness. There is no rebound and no guarding.  Musculoskeletal: Normal range of motion. He exhibits no edema.  Lymphadenopathy:    He has no cervical adenopathy.  Neurological: He is alert and oriented to person, place, and time. He has normal reflexes. No cranial nerve deficit. Gait normal. Coordination normal.  Skin: Skin is warm and dry. No rash noted.  Psychiatric: Mood, memory, affect and judgment normal.  Nursing note and vitals reviewed.      LABORATORY DATA: I have reviewed the data as listed. CBC    Component Value Date/Time   WBC 13.0 (H) 03/29/2017 0919   RBC 5.07 03/29/2017 0919   HGB 15.7 03/29/2017 0919   HCT 47.0 03/29/2017 0919   PLT 333 03/29/2017 0919   MCV 92.7 03/29/2017 0919   MCH 31.0 03/29/2017 0919   MCHC 33.4 03/29/2017 0919   RDW 12.8 03/29/2017 0919   LYMPHSABS 5.2 (H) 03/29/2017 0919   MONOABS 1.0 03/29/2017 0919  EOSABS 0.8 (H) 03/29/2017 0919   BASOSABS 0.0 03/29/2017 0919    CMP     Component Value Date/Time   NA 142 03/29/2017 0919   K 4.4 03/29/2017 0919   CL 109 03/29/2017 0919   CO2 27 03/29/2017 0919   GLUCOSE 107 (H) 03/29/2017 0919   BUN 15 03/29/2017 0919   CREATININE 1.35 (H) 03/29/2017 0919   CREATININE 1.20 (H) 05/05/2016 0840   CALCIUM 9.0 03/29/2017 0919   PROT 6.8 03/29/2017 0919   PROT 6.4 12/18/2013 0941   ALBUMIN 4.0 03/29/2017 0919   AST 24 03/29/2017 0919   ALT 21 03/29/2017 0919   ALKPHOS 56 03/29/2017 0919   BILITOT 1.2 03/29/2017 0919   GFRNONAA 48 (L) 03/29/2017 0919   GFRAA 56 (L) 03/29/2017 0919   RADIOLOGY: I have reviewed the images below and agree with the reported results Study Result   CT HEAD WITHOUT AND WITH CONTRAST 07/01/2016  IMPRESSION: No acute intracranial abnormality. No abnormal enhancement. Unremarkable CT of head for age.   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 R hemicolectomy. Inability to tolerate adjuvant XELODA 02/29/2012 History of prostate cancer. TURP X 2, XRT at [facility] in 2011  Chronic lymphocytic leukemia, flow cytometry on 12/13/2014 with a small monoclonal B-cell population CD5+, CD23+ RAI clinical stage 0 Iron deficiency Hypocalcemia  I personally reviewed and went over laboratory studies with the patient at length. CBC is stable.   NCCN guidelines for surveillance for Colon cancer are as follows (1.2017):  B. Stage II, Stage III 1. H+P every 3-6 months x 2 years and then every 6 months for a  total of 5 years  2. CEA every 3-6 months x 2 years and then every 6 months for a total of 5 years  3. CT CAP every 6-12 months (category 2B for frequency < 12 months) for a total of 5 years . 4.  Colonoscopy in 1 year except if no preoperative colonoscopy due to obstructing lesion, colonoscopy in 3-6 months.  A. If advanced adenoma, repeat in 1 year B. If no advanced adenoma, repeat in 3 years, then every 5 years 5. PET/CT scan is not recommended  I am concerned about patient's hematuria and possible bladder cancer. I have advised him to follow up with Dr. Nevada Crane at the scheduled appointment on 04/13/17 for the cystoscopy. He may need neoadjuvant chemo if this is bladder cancer, however patient had very poor tolerability to chemo in the past.  Continue observation at this time, clinically NED from his prostate cancer and colon cancer standpoint.  Patient will be due for surveillance colonoscopy this year in September 2018 with Dr. Gala Romney. I have advised patient to get this scheduled.  Perform annual surveillance CT in October 2018 prior to his next visit.  CLL is stable, he remains in RAI stage 0. Has leukocytosis but no evidence of worsening anemia or thrombocytopenia. No indication to initiate treatment at this time.   RTC in 3 months for follow up with CBC, CMP CEA, PSA. I have told him to see me sooner should the underlying cause of his new hematuria turn out to be bladder cancer.     Orders Placed This Encounter  Procedures  . CT Abdomen Pelvis W Contrast    Standing Status:   Future    Standing Expiration Date:   03/31/2018    Order Specific Question:   If indicated for the ordered procedure, I authorize the administration of contrast media per  Radiology protocol    Answer:   Yes    Order Specific Question:   Preferred imaging location?    Answer:   [facility]    Order Specific Question:   Radiology Contrast Protocol - do NOT remove file path    Answer:    [email or web address]  . CBC with Differential    Standing Status:   Future    Standing Expiration Date:   03/31/2018  . Comprehensive metabolic panel    Standing Status:   Future    Standing Expiration Date:   03/31/2018  . CEA    Standing Status:   Future    Standing Expiration Date:   03/31/2018  . PSA    Standing Status:   Future    Standing Expiration Date:   03/31/2018    This document serves as a record of services personally performed by Twana First, MD. It was created on her behalf by Shirlean Mylar, a trained medical scribe. The creation of this record is based on the scribe's personal observations and the provider's statements to them. This document has been checked and approved by the attending provider.   This note was electronically signed.  I have reviewed the above documentation for accuracy and completeness and I agree with the above.

## 2017-04-26 ENCOUNTER — Telehealth: Payer: Self-pay | Admitting: Cardiovascular Disease

## 2017-04-26 DIAGNOSIS — Z79899 Other long term (current) drug therapy: Secondary | ICD-10-CM

## 2017-04-26 NOTE — Telephone Encounter (Signed)
Pt notified he will go in [place] office he will have them call with any issues

## 2017-04-26 NOTE — Telephone Encounter (Signed)
Needs a lipid profile only - had CMET in July, please MCr

## 2017-04-26 NOTE — Telephone Encounter (Signed)
Pt wants to know if he will need lab work before his appt with C on 05-03-17? If so,he will need an order.

## 2017-04-27 LAB — LIPID PANEL
CHOL/HDL RATIO: 2.1 ratio (ref 0.0–5.0)
Cholesterol, Total: 133 mg/dL (ref 100–199)
HDL: 63 mg/dL (ref >39)
LDL CALC: 49 mg/dL (ref 0–99)
Triglycerides: 106 mg/dL (ref 0–149)
VLDL CHOLESTEROL CAL: 21 mg/dL (ref 5–40)

## 2017-04-28 ENCOUNTER — Encounter: Payer: Self-pay | Admitting: *Deleted

## 2017-05-02 ENCOUNTER — Other Ambulatory Visit (HOSPITAL_COMMUNITY): Payer: Self-pay | Admitting: Oncology

## 2017-05-02 ENCOUNTER — Other Ambulatory Visit: Payer: Self-pay | Admitting: Cardiovascular Disease

## 2017-05-02 DIAGNOSIS — E559 Vitamin D deficiency, unspecified: Secondary | ICD-10-CM

## 2017-05-02 NOTE — Progress Notes (Signed)
Cardiology Office Note    Date:  05/03/2017   ID:  David Pugh, DOB Jan 21, 1935, MRN [id number]  PCP:  [name]., MD  Cardiologist:   Sanda Klein, MD   No chief complaint on file.   History of Present Illness:  David Pugh is a 82 y.o. male with coronary artery disease (drug-eluting stents to mid LAD and ramus intermedius in 2012, balloon angioplasty of small OM branch in 2015) who returns for follow-up.   Continues to exercise regularly, without complaints of dyspnea, dizziness or angina pectoris. He had intended to stop his isosorbide a year ago, then "chickened out". He wants to stop it now. He has not had other focal neurological complaints and denies palpitations, syncope, edema or claudication.  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 Significant comorbid conditions include systemic hypertension and hyperlipidemia, both treated. In 2010, he underwent surgery and radiation for prostate cancer. In 2012, he had stage IIIB colon cancer with 2 out of 20 positive lymph nodes but was intolerant of chemotherapy. 4 years have passed since his surgery and he has no evidence of disease. He has chronic lymphocytic leukemia, but does not require any treatment for this at this time.  Past Medical History:  Diagnosis Date  . Abnormality of gait 12/18/2013  . Anemia    hx of anemia as a child   . Arthritis   . Biliary dyskinesia   . CAD (coronary artery disease) 2012   a. hx of stent x2 in ramus/OM and mid LAD in 2012  b. balloon angioplaty of OM  . Colon cancer ([place]) 12/2011   s/p right hemicolectomy, did not tolerate chemo  . Colon polyps    tubular adenomas  . Diverticula of colon 2010  . Exposure to [employer]   . Family history of prostate cancer   . Family history of stomach cancer   . Folic acid deficiency   . Gastroparesis 04/12/2013   borderline  . GERD (gastroesophageal reflux disease)   . Glaucoma    . Heart palpitations 10/2012  . Hiatal hernia 2010   tiny  . HTN (hypertension)   . Iron deficiency 05/02/2012   followed by Dr. Tressie [name], iron infusions.   . Myocardial infarction ([place]) 2012  . Peripheral neuropathy   . Prostate cancer [facility]) 2010   s/p radiation, surgery  . PTSD (post-traumatic stress disorder)   . Radiation Feb 2011   treatment  . Radiation proctitis   . Rosacea     Past Surgical History:  Procedure Laterality Date  . CARDIAC CATHETERIZATION  02/13/2011   [facility], [facility] cardiology  . CATARACT EXTRACTION     2011  . CATARACT EXTRACTION W/PHACO Right 08/06/2013   Procedure: CATARACT EXTRACTION PHACO AND INTRAOCULAR LENS PLACEMENT (IOC);  Surgeon: Tonny Branch, MD;  Location: [facility] ORS;  Service: Ophthalmology;  Laterality: Right;  CDE:20.96  . CHOLECYSTECTOMY    . COLONOSCOPY  02/12/2004   Dr. Gala Romney- L side diverticula, inflammatory  colon polyps  . COLONOSCOPY  01/19/2012   RMR: Prominent changes involving the rectal mucosa consistent with radiation-induced proctitis. Multiple colonic  polyps removed as described above. Sigmoid Diverticulosis/ 1.5 x 2 cm relatively flat ulcerated lesion in cecum/path showed adenocarcinoma of the colon. descending colon polyp with tubular adenoma and one fragment of focal high grade dysplasia.   . COLONOSCOPY  08/28/2012   VFI:EPPIRJJOA proctitis. Colonic diverticulosis/Ulcerations at the surgical anastomosis  path: ulcerated  colonic mucosa with prolapse changes, tubular adenoma.   . COLONOSCOPY N/A 08/02/2013   RMR: Colonic polyps -removed as described above. Colonic diverticulosis. Status post reight hemicolectomy. Radiation proctitis- asymptomatic.   Marland Kitchen COLONOSCOPY N/A 05/09/2015   Procedure: COLONOSCOPY;  Surgeon: Daneil Dolin, MD;  Location: [facility];  Service: Endoscopy;  Laterality: N/A;  1200-moved to [address]5 [address] to notify pt  . CORONARY ANGIOPLASTY  06/17/14   OM1 POBA  . CORONARY STENT PLACEMENT  01/2011   2.5x63mm  Xience drug-eluting stent in ramus intermedius/OMI vessel, 2.5x53mm Promus Element stent in mid LAD artery  . ESOPHAGOGASTRODUODENOSCOPY  10/10/2008   Dr. Tilda Burrow hiatal hernia, normal esophagus, normal stomach  . ESOPHAGOGASTRODUODENOSCOPY  02/12/2004   Dr. Dudley Major- normal   . ESOPHAGOGASTRODUODENOSCOPY N/A 05/09/2015   Procedure: ESOPHAGOGASTRODUODENOSCOPY (EGD);  Surgeon: Daneil Dolin, MD;  Location: [facility];  Service: Endoscopy;  Laterality: N/A;  . GIVENS CAPSULE STUDY N/A 06/03/2015   Procedure: GIVENS CAPSULE STUDY;  Surgeon: Daneil Dolin, MD;  Location: [facility];  Service: Endoscopy;  Laterality: N/A;  0700  . KNEE SURGERY     left knee   . LEFT HEART CATHETERIZATION WITH CORONARY ANGIOGRAM N/A 06/17/2014   Procedure: LEFT HEART CATHETERIZATION WITH CORONARY ANGIOGRAM;  Surgeon: Burnell Blanks, MD;  Location: [facility] CATH LAB;  Service: Cardiovascular;  Laterality: N/A;  . PARTIAL COLECTOMY  02/29/2012   Procedure: PARTIAL COLECTOMY;  Surgeon: Adin Hector, MD;  Location: [facility] ORS;  Service: General;  Laterality: Right;  . PROSTATECTOMY    . TONSILLECTOMY    . TRANSURETHRAL RESECTION OF PROSTATE    . VASECTOMY      Current Medications: Outpatient Medications Prior to Visit  Medication Sig Dispense Refill  . aspirin EC 81 MG tablet Take 81 mg by mouth daily.    . B Complex Vitamins (VITAMIN B COMPLEX PO) Take 1 tablet by mouth daily.     . cetirizine (ZYRTEC) 10 MG tablet Take 10 mg by mouth daily.    . citalopram (CELEXA) 10 MG tablet Take 10 mg by mouth every morning.     . clopidogrel (PLAVIX) 75 MG tablet TAKE (1) TABLET BY MOUTH ONCE DAILY. 30 tablet 11  . Cyanocobalamin (RA VITAMIN B-12 TR) 1000 MCG TBCR Take by mouth.    . folic acid (FOLVITE) 1 MG tablet TAKE (1) TABLET BY MOUTH ONCE DAILY. 30 tablet 11  . isosorbide mononitrate (IMDUR) 30 MG 24 hr tablet TAKE (1) TABLET BY MOUTH ONCE DAILY. 30 tablet 3  . latanoprost (XALATAN) 0.005 % ophthalmic solution  Place 1 drop into both eyes at bedtime.     Marland Kitchen loperamide (IMODIUM) 2 MG capsule Take 2 mg by mouth as needed for diarrhea or loose stools.    Marland Kitchen losartan (COZAAR) 50 MG tablet Take 25 mg by mouth 2 (two) times daily.     . metoprolol (LOPRESSOR) 50 MG tablet Take 50 mg by mouth 2 (two) times daily.     . Multiple Vitamin (MULTIVITAMIN WITH MINERALS) TABS tablet Take 1 tablet by mouth daily.    Marland Kitchen NITROSTAT 0.4 MG SL tablet PLACE ONE (1) TABLET UNDER TONGUE EVERY 5 MINUTES UP TO (3) DOSES AS NEEDED FOR CHEST PAIN. 25 tablet 1  . ofloxacin (OCUFLOX) 0.3 % ophthalmic solution Take as directed.    . pantoprazole (PROTONIX) 40 MG tablet TAKE 2 TABLETS BY MOUTH ONCE DAILY. (Patient taking differently: takes one tablet twice daily) 60 tablet 6  . pravastatin (PRAVACHOL)  40 MG tablet TAKE 1 TABLET BY MOUTH AT BEDTIME. 30 tablet 9  . Probiotic Product (PROBIOTIC DAILY PO) Take 1 capsule by mouth daily.    Marland Kitchen UNABLE TO FIND Take 1 capsule by mouth once. Med Name: Instaflex    . Vitamin D, Ergocalciferol, (DRISDOL) 50000 units CAPS capsule TAKE 1 CAPSULE TWICE A WEEK 8 capsule 0   No facility-administered medications prior to visit.      Allergies:   Shellfish allergy; Sulfa antibiotics; and Sulfonamide derivatives   Social History   Social History  . Marital status: Married    Spouse name: N/A  . Number of children: 3  . Years of education: military   Occupational History  . Retired   .  Retired   Social History Main Topics  . Smoking status: Never Smoker  . Smokeless tobacco: Never Used  . Alcohol use Yes     Comment: 1 cocktail/month  . Drug use: No  . Sexual activity: Yes    Birth control/ protection: None   Other Topics Concern  . None   Social History Narrative  . None     Family History:  The patient's family history includes Cancer in his paternal uncle; Heart attack in his paternal grandfather; Melanoma in his brother; Neuropathy in his brother; Prostate cancer (age of onset:  44) in his brother; Stomach cancer (age of onset: 23) in his cousin; Throat cancer in his father.   ROS:   Please see the history of present illness.    ROS All other systems reviewed and are negative.   PHYSICAL EXAM:   VS:  BP 140/84   Pulse 61   Ht 5' 10.5" (1.791 m)   Wt 224 lb (101.6 kg)   BMI 31.69 kg/m    GEN: Well nourished, well developed General: Alert, oriented x3, no distress Head: no evidence of trauma, PERRL, EOMI, no exophtalmos or lid lag, no myxedema, no xanthelasma; normal ears, nose and oropharynx Neck: normal jugular venous pulsations and no hepatojugular reflux; brisk carotid pulses without delay and no carotid bruits Chest: clear to auscultation, no signs of consolidation by percussion or palpation, normal fremitus, symmetrical and full respiratory excursions Cardiovascular: normal position and quality of the apical impulse, regular rhythm, normal first and second heart sounds, no murmurs, rubs or gallops Abdomen: no tenderness or distention, no masses by palpation, no abnormal pulsatility or arterial bruits, normal bowel sounds, no hepatosplenomegaly Extremities: no clubbing, cyanosis or edema; 2+ radial, ulnar and brachial pulses bilaterally; 2+ right femoral, posterior tibial and dorsalis pedis pulses; 2+ left femoral, posterior tibial and dorsalis pedis pulses; no subclavian or femoral bruits Neurological: grossly nonfocal Psych: euthymic mood, full affect  Wt Readings from Last 3 Encounters:  05/03/17 224 lb (101.6 kg)  03/31/17 228 lb (103.4 kg)  09/30/16 230 lb 4.8 oz (104.5 kg)      Studies/Labs Reviewed:   EKG:  EKG is ordered today.  The ekg ordered today demonstrates Sinus bradycardia, nonspecific T-wave inversion in leads V2-V3, tall R wave in lead V2, QTC 411 ms  Recent Labs: 03/29/2017: ALT 21; BUN 15; Creatinine, Ser 1.35; Hemoglobin 15.7; Platelets 333; Potassium 4.4; Sodium 142   Lipid Panel    Component Value Date/Time   CHOL 133  04/27/2017 0908   TRIG 106 04/27/2017 0908   HDL 63 04/27/2017 0908   CHOLHDL 2.1 04/27/2017 0908   CHOLHDL 1.7 05/05/2016 0840   VLDL 15 05/05/2016 0840   LDLCALC 49 04/27/2017 0908  ASSESSMENT:    1. Coronary artery disease involving native coronary artery of native heart without angina pectoris   2. Dyslipidemia   3. Essential hypertension   4. Mild obesity      PLAN:  In order of problems listed above:  1. CAD: Asymptomatic. No angina despite active lifestyle. 2. HLP: Excellent lipid profile 3. HTN: controlled. Typically blood pressure is about 10-15 points lower than today. 4. Obesity: Further weight loss encouraged. 5. Prostate cancer and colon cancer in remission, chronic lymphocytic leukemia not requiring treatment    Medication Adjustments/Labs and Tests Ordered: Current medicines are reviewed at length with the patient today.  Concerns regarding medicines are outlined above.  Medication changes, Labs and Tests ordered today are listed in the Patient Instructions below. Patient Instructions  Medication Instructions: Your physician recommends that you continue on your current medications as directed. Please refer to the Current Medication list given to you today.   Follow-Up: Your physician wants you to follow-up in: 12 months with Dr. Sallyanne Kuster. You will receive a reminder letter in the mail two months in advance. If you don't receive a letter, please call our office to schedule the follow-up appointment.  If you need a refill on your cardiac medications before your next appointment, please call your pharmacy.     Signed, Sanda Klein, MD  05/03/2017 9:47 AM    [facility] [address] [place], [place], [place]  [address] Phone: [phone]; Fax: [phone]

## 2017-05-03 ENCOUNTER — Encounter: Payer: Self-pay | Admitting: Cardiovascular Disease

## 2017-05-03 ENCOUNTER — Ambulatory Visit (INDEPENDENT_AMBULATORY_CARE_PROVIDER_SITE_OTHER): Payer: Medicare Other | Admitting: Cardiovascular Disease

## 2017-05-03 VITALS — BP 140/84 | HR 61 | Ht 70.5 in | Wt 224.0 lb

## 2017-05-03 DIAGNOSIS — I2510 Atherosclerotic heart disease of native coronary artery without angina pectoris: Secondary | ICD-10-CM | POA: Diagnosis not present

## 2017-05-03 DIAGNOSIS — I10 Essential (primary) hypertension: Secondary | ICD-10-CM

## 2017-05-03 DIAGNOSIS — E785 Hyperlipidemia, unspecified: Secondary | ICD-10-CM | POA: Diagnosis not present

## 2017-05-03 DIAGNOSIS — E669 Obesity, unspecified: Secondary | ICD-10-CM | POA: Diagnosis not present

## 2017-05-03 NOTE — Patient Instructions (Signed)
Medication Instructions: Your physician recommends that you continue on your current medications as directed. Please refer to the Current Medication list given to you today.   Follow-Up: Your physician wants you to follow-up in: 12 months with Dr. Sallyanne Kuster. You will receive a reminder letter in the mail two months in advance. If you don't receive a letter, please call our office to schedule the follow-up appointment.  If you need a refill on your cardiac medications before your next appointment, please call your pharmacy.

## 2017-05-03 NOTE — Telephone Encounter (Signed)
REFILL 

## 2017-06-09 ENCOUNTER — Encounter (HOSPITAL_COMMUNITY): Payer: Medicare Other | Attending: Oncology

## 2017-06-09 DIAGNOSIS — C182 Malignant neoplasm of ascending colon: Secondary | ICD-10-CM | POA: Insufficient documentation

## 2017-06-09 DIAGNOSIS — C9190 Lymphoid leukemia, unspecified not having achieved remission: Secondary | ICD-10-CM | POA: Insufficient documentation

## 2017-06-09 DIAGNOSIS — C9110 Chronic lymphocytic leukemia of B-cell type not having achieved remission: Secondary | ICD-10-CM

## 2017-06-09 LAB — COMPREHENSIVE METABOLIC PANEL
ALT: 20 U/L (ref 17–63)
AST: 24 U/L (ref 15–41)
Albumin: 3.7 g/dL (ref 3.5–5.0)
Alkaline Phosphatase: 56 U/L (ref 38–126)
Anion gap: 9 (ref 5–15)
BILIRUBIN TOTAL: 0.9 mg/dL (ref 0.3–1.2)
BUN: 16 mg/dL (ref 6–20)
CALCIUM: 8.9 mg/dL (ref 8.9–10.3)
CO2: 26 mmol/L (ref 22–32)
CREATININE: 1.26 mg/dL — AB (ref 0.61–1.24)
Chloride: 106 mmol/L (ref 101–111)
GFR calc Af Amer: >60 mL/min (ref >60)
GFR, EST NON AFRICAN AMERICAN: 52 mL/min — AB (ref >60)
Glucose, Bld: 137 mg/dL — ABNORMAL HIGH (ref 65–99)
Potassium: 4.2 mmol/L (ref 3.5–5.1)
Sodium: 141 mmol/L (ref 135–145)
TOTAL PROTEIN: 6.7 g/dL (ref 6.5–8.1)

## 2017-06-09 LAB — CBC WITH DIFFERENTIAL/PLATELET
BASOS ABS: 0.1 10*3/uL (ref 0.0–0.1)
Basophils Relative: 1 %
Eosinophils Absolute: 0.6 10*3/uL (ref 0.0–0.7)
Eosinophils Relative: 5 %
HEMATOCRIT: 46.3 % (ref 39.0–52.0)
Hemoglobin: 15.4 g/dL (ref 13.0–17.0)
LYMPHS ABS: 3.8 10*3/uL (ref 0.7–4.0)
LYMPHS PCT: 31 %
MCH: 31.2 pg (ref 26.0–34.0)
MCHC: 33.3 g/dL (ref 30.0–36.0)
MCV: 93.9 fL (ref 78.0–100.0)
MONO ABS: 1.2 10*3/uL — AB (ref 0.1–1.0)
Monocytes Relative: 9 %
NEUTROS ABS: 6.7 10*3/uL (ref 1.7–7.7)
Neutrophils Relative %: 54 %
Platelets: 341 10*3/uL (ref 150–400)
RBC: 4.93 MIL/uL (ref 4.22–5.81)
RDW: 12.8 % (ref 11.5–15.5)
WBC: 12.4 10*3/uL — ABNORMAL HIGH (ref 4.0–10.5)

## 2017-06-09 LAB — PSA: PROSTATIC SPECIFIC ANTIGEN: 0.18 ng/mL (ref 0.00–4.00)

## 2017-06-10 LAB — CEA: CEA1: 2 ng/mL (ref 0.0–4.7)

## 2017-06-14 ENCOUNTER — Ambulatory Visit (HOSPITAL_COMMUNITY)
Admission: RE | Admit: 2017-06-14 | Discharge: 2017-06-14 | Disposition: A | Discharge status: Home / Self Care | Payer: Medicare Other | Source: Ambulatory Visit | Attending: Oncology | Admitting: Oncology

## 2017-06-14 DIAGNOSIS — C9190 Lymphoid leukemia, unspecified not having achieved remission: Secondary | ICD-10-CM | POA: Insufficient documentation

## 2017-06-14 DIAGNOSIS — C182 Malignant neoplasm of ascending colon: Secondary | ICD-10-CM | POA: Insufficient documentation

## 2017-06-14 DIAGNOSIS — C9110 Chronic lymphocytic leukemia of B-cell type not having achieved remission: Secondary | ICD-10-CM

## 2017-06-14 DIAGNOSIS — Z9049 Acquired absence of other specified parts of digestive tract: Secondary | ICD-10-CM | POA: Diagnosis not present

## 2017-06-14 DIAGNOSIS — I2510 Atherosclerotic heart disease of native coronary artery without angina pectoris: Secondary | ICD-10-CM | POA: Diagnosis not present

## 2017-06-14 DIAGNOSIS — I700 Atherosclerosis of aorta: Secondary | ICD-10-CM | POA: Insufficient documentation

## 2017-06-14 MED ORDER — IOPAMIDOL (ISOVUE-300) INJECTION 61%
100.0000 mL | Freq: Once | INTRAVENOUS | Status: AC | PRN
  Administered 2017-06-14: 100 mL via INTRAVENOUS

## 2017-06-16 ENCOUNTER — Encounter (HOSPITAL_BASED_OUTPATIENT_CLINIC_OR_DEPARTMENT_OTHER): Payer: Medicare Other | Admitting: Oncology

## 2017-06-16 ENCOUNTER — Encounter (HOSPITAL_COMMUNITY): Payer: Self-pay

## 2017-06-16 VITALS — BP 139/67 | HR 62 | Resp 16 | Ht 70.5 in | Wt 224.4 lb

## 2017-06-16 DIAGNOSIS — Z85038 Personal history of other malignant neoplasm of large intestine: Secondary | ICD-10-CM

## 2017-06-16 DIAGNOSIS — E611 Iron deficiency: Secondary | ICD-10-CM | POA: Diagnosis not present

## 2017-06-16 DIAGNOSIS — C9110 Chronic lymphocytic leukemia of B-cell type not having achieved remission: Secondary | ICD-10-CM

## 2017-06-16 DIAGNOSIS — Z23 Encounter for immunization: Secondary | ICD-10-CM

## 2017-06-16 DIAGNOSIS — K589 Irritable bowel syndrome without diarrhea: Secondary | ICD-10-CM | POA: Diagnosis not present

## 2017-06-16 DIAGNOSIS — Z8546 Personal history of malignant neoplasm of prostate: Secondary | ICD-10-CM

## 2017-06-16 DIAGNOSIS — C182 Malignant neoplasm of ascending colon: Secondary | ICD-10-CM

## 2017-06-16 MED ORDER — INFLUENZA VAC SPLIT HIGH-DOSE 0.5 ML IM SUSY
0.5000 mL | PREFILLED_SYRINGE | INTRAMUSCULAR | Status: AC
  Administered 2017-06-16: 0.5 mL via INTRAMUSCULAR
  Filled 2017-06-16: qty 0.5

## 2017-06-16 NOTE — Progress Notes (Signed)
Progress Note  David Pugh., MD [address] 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 R hemicolectomy. Inability to tolerate adjuvant XELODA 02/29/2012 History of prostate cancer. TURP X 2, XRT at [facility] in 2011  Chronic lymphocytic leukemia, flow cytometry on 12/13/2014 with a small monoclonal B-cell population CD5+, CD23+ RAI clinical stage 0 CT C/A/P 06/13/2014 no findings to suggest recurrent or metastatic disease in the abdomen/pelvis Colonoscopy 05/09/15    Cancer of ascending colon ([place])   02/29/2012 Procedure    laparoscopic assisted R colectomy      03/09/2012 Miscellaneous    Iron deficiency and folic acid deficiency      03/29/2012 Initial Diagnosis    Cancer of ascending colon, CEA on 01/19/2012 was WNL      08/02/2013 Procedure    Ileocolonoscopy with snare polypectomy      06/11/2014 Imaging    CT abdomen/pelvis with no findings to suggest metastatic disease. Diverticulosis      06/11/2016 Imaging    CT Abdomen/Pelvis with no findings to suggest recurrent or metastatic disease. Prostatomegaly.         CURRENT THERAPY: Observation   INTERVAL HISTORY: David Pugh 83 y.o. male with a history of CAD, Stage III CRC, prostate cancer and CLL who is doing well. He is here today for regular follow-up of his CLL, stage III CRC and prostate cancer. He is up to date with his screening colonoscopy.    David Pugh is accompanied by his wife. I personally reviewed and went over imaging and laboratory studies with the patient.   Patient presents today for continued follow up.  He states he has been doing well. The previous hematuria has resolved. He had undergone cystoscopy with Dr. Luana Pugh in [facility] and found that his bladder was bleeding due to previous radiation for his prostate cancer. He continues to have chronic diarrhea daily from his IBS and normally has about 2-3 BMs a day. He denies any chest pain, shortness of  breast, recent infections, N/V, urinary symptoms.      Past Medical History:  Diagnosis Date  . Abnormality of gait 12/18/2013  . Anemia    hx of anemia as a child   . Arthritis   . Biliary dyskinesia   . CAD (coronary artery disease) 2012   a. hx of stent x2 in ramus/OM and mid LAD in 2012  b. balloon angioplaty of OM  . Colon cancer ([place]) 12/2011   s/p right hemicolectomy, did not tolerate chemo  . Colon polyps    tubular adenomas  . Diverticula of colon 2010  . Exposure to [employer]   . Family history of prostate cancer   . Family history of stomach cancer   . Folic acid deficiency   . Gastroparesis 04/12/2013   borderline  . GERD (gastroesophageal reflux disease)   . Glaucoma   . Heart palpitations 10/2012  . Hiatal hernia 2010   tiny  . HTN (hypertension)   . Iron deficiency 05/02/2012   followed by Dr. Tressie [name], iron infusions.   . Myocardial infarction ([place]) 2012  . Peripheral neuropathy   . Prostate cancer [facility]) 2010   s/p radiation, surgery  . PTSD (post-traumatic stress disorder)   . Radiation Feb 2011   treatment  . Radiation proctitis   . Rosacea     has PREMATURE VENTRICULAR CONTRACTIONS; GERD; Cancer of ascending colon ([place]); Iron deficiency; Hx of adenomatous colonic polyps; Folic  acid deficiency; Peripheral neuropathy; CAD (coronary artery disease); HTN (hypertension); Dyslipidemia; Viral gastroenteritis; Abnormality of gait; Shingles outbreak, right anterior thoracic; Unstable angina ([place]); H/O agent Orange exposure; Radiation proctitis; H/O prostate cancer; Family history of prostate cancer; Family history of colon cancer; Family history of stomach cancer; Mucosal abnormality of esophagus; Mucosal abnormality of stomach; Multiple gastric polyps; History of colon cancer; Diverticulosis of colon without hemorrhage; CLL (chronic lymphocytic leukemia) ([place]); Vitamin D deficiency; Benign prostatic hyperplasia with urinary obstruction; and Morbid obesity ([place])  on his problem list.    Social History   Social History  . Marital status: Married    Spouse name: N/A  . Number of children: 3  . Years of education: military   Occupational History  . Retired   .  Retired   Social History Main Topics  . Smoking status: Never Smoker  . Smokeless tobacco: Never Used  . Alcohol use Yes     Comment: 1 cocktail/month  . Drug use: No  . Sexual activity: Yes    Birth control/ protection: None   Other Topics Concern  . None   Social History Narrative  . None   3 tumors in [place]  Family History  Problem Relation Age of Onset  . Throat cancer Father        dx in his 42s and again in his 14s; pipe and cigar smoker  . Neuropathy Brother   . Prostate cancer Brother 58  . Melanoma Brother        dx in his 30s  . Cancer Paternal Uncle        smoking related cancer  . Heart attack Paternal Grandfather   . Stomach cancer Cousin 30  . Colon cancer Neg Hx      is allergic to shellfish allergy; sulfa antibiotics; and sulfonamide derivatives.  David Pugh had no medications administered during this visit.  Past Surgical History:  Procedure Laterality Date  . CARDIAC CATHETERIZATION  02/13/2011   [facility] cardiology  . CATARACT EXTRACTION     2011  . CATARACT EXTRACTION W/PHACO Right 08/06/2013   Procedure: CATARACT EXTRACTION PHACO AND INTRAOCULAR LENS PLACEMENT (IOC);  Surgeon: David Branch, MD;  Location: [facility] ORS;  Service: Ophthalmology;  Laterality: Right;  CDE:20.96  . CHOLECYSTECTOMY    . COLONOSCOPY  02/12/2004   Dr. Gala Romney- L side diverticula, inflammatory  colon polyps  . COLONOSCOPY  01/19/2012   RMR: Prominent changes involving the rectal mucosa consistent with radiation-induced proctitis. Multiple colonic  polyps removed as described above. Sigmoid Diverticulosis/ 1.5 x 2 cm relatively flat ulcerated lesion in cecum/path showed adenocarcinoma of the colon. descending colon polyp with tubular adenoma and one fragment of focal  high grade dysplasia.   . COLONOSCOPY  08/28/2012   BHA:[id number] proctitis. Colonic diverticulosis/Ulcerations at the surgical anastomosis  path: ulcerated colonic mucosa with prolapse changes, tubular adenoma.   . COLONOSCOPY N/A 08/02/2013   RMR: Colonic polyps -removed as described above. Colonic diverticulosis. Status post reight hemicolectomy. Radiation proctitis- asymptomatic.   Marland Kitchen COLONOSCOPY N/A 05/09/2015   Procedure: COLONOSCOPY;  Surgeon: Daneil Dolin, MD;  Location: [facility] ENDO SUITE;  Service: Endoscopy;  Laterality: N/A;  1200-moved to [address] to notify pt  . CORONARY ANGIOPLASTY  06/17/14   OM1 POBA  . CORONARY STENT PLACEMENT  01/2011   2.5x35mm Xience drug-eluting stent in ramus intermedius/OMI vessel, 2.5x16mm Promus Element stent in mid LAD artery  . ESOPHAGOGASTRODUODENOSCOPY  10/10/2008   Dr. Tilda Burrow hiatal hernia, normal esophagus, normal stomach  .  ESOPHAGOGASTRODUODENOSCOPY  02/12/2004   Dr. Dudley Major- normal   . ESOPHAGOGASTRODUODENOSCOPY N/A 05/09/2015   Procedure: ESOPHAGOGASTRODUODENOSCOPY (EGD);  Surgeon: Daneil Dolin, MD;  Location: [facility] END[facility];  Service: Endoscopy;  Laterality: N/A;  . GIVENS CAPSULE STUDY N/A 06/03/2015   Procedure: GIVENS CAPSULE STUDY;  Surgeon: Daneil Dolin, MD;  Location: [facility] END[facility] SU[facility];  Service: Endoscopy;  Laterality: N/A;  0700  . KNEE SURGERY     left knee   . LEFT HEART CATHETERIZATION WITH CORONARY ANGIOGRAM N/A 06/17/2014   Procedure: LEFT HEART CATHETERIZATION WITH CORONARY ANGIOGRAM;  Surgeon: Burnell Blanks, MD;  Location: [facility] CATH LAB;  Service: Cardiovascular;  Laterality: N/A;  . PARTIAL COLECTOMY  02/29/2012   Procedure: PARTIAL COLECTOMY;  Surgeon: Adin Hector, MD;  Location: [facility] ORS;  Service: General;  Laterality: Right;  . PROSTATECTOMY    . TONSILLECTOMY    . TRANSURETHRAL RESECTION OF PROSTATE    . VASECTOMY      Review of Systems  Constitutional: Negative.   HENT: Negative.   Eyes: Negative.     Respiratory: Negative.        Denies hematochezia  Cardiovascular: Negative.   Gastrointestinal: Positive for diarrhea. Negative for constipation and melena.  Genitourinary: Negative for hematuria.  Skin: Negative.   Endo/Heme/Allergies: Negative.   Psychiatric/Behavioral: Negative.   All other systems reviewed and are negative.   14 point review of systems was performed and is negative except as detailed under history of present illness and above   PHYSICAL EXAMINATION  ECOG PERFORMANCE STATUS: 0 - Asymptomatic  There were no vitals filed for this visit.    Physical Exam  Constitutional: He is oriented to person, place, and time and well-developed, well-nourished, and in no distress.  HENT:  Head: Normocephalic and atraumatic.  Nose: Nose normal.  Mouth/Throat: Oropharynx is clear and moist. No oropharyngeal exudate.  Eyes: Pupils are equal, round, and reactive to light. Conjunctivae and EOM are normal. Right eye exhibits no discharge. Left eye exhibits no discharge. No scleral icterus.  Neck: Normal range of motion. Neck supple. No tracheal deviation present. No thyromegaly present.  Cardiovascular: Normal rate, regular rhythm and normal heart sounds.  Exam reveals no gallop and no friction rub.   No murmur heard. Pulmonary/Chest: Effort normal and breath sounds normal. He has no wheezes. He has no rales.  Abdominal: Soft. Bowel sounds are normal. He exhibits no distension and no mass. There is no tenderness. There is no rebound and no guarding.  Musculoskeletal: Normal range of motion. He exhibits no edema.  Lymphadenopathy:    He has no cervical adenopathy.  Neurological: He is alert and oriented to person, place, and time. He has normal reflexes. No cranial nerve deficit. Gait normal. Coordination normal.  Skin: Skin is warm and dry. No rash noted.  Psychiatric: Mood, memory, affect and judgment normal.  Nursing note and vitals reviewed.    LABORATORY DATA: I have  reviewed the data as listed. CBC    Component Value Date/Time   WBC 12.4 (H) 06/09/2017 1048   RBC 4.93 06/09/2017 1048   HGB 15.4 06/09/2017 1048   HCT 46.3 06/09/2017 1048   PLT 341 06/09/2017 1048   MCV 93.9 06/09/2017 1048   MCH 31.2 06/09/2017 1048   MCHC 33.3 06/09/2017 1048   RDW 12.8 06/09/2017 1048   LYMPHSABS 3.8 06/09/2017 1048   MONOABS 1.2 (H) 06/09/2017 1048   EOSABS 0.6 06/09/2017 1048   BASOSABS 0.1 06/09/2017 1048    CMP  Component Value Date/Time   NA 141 06/09/2017 1048   K 4.2 06/09/2017 1048   CL 106 06/09/2017 1048   CO2 26 06/09/2017 1048   GLUCOSE 137 (H) 06/09/2017 1048   BUN 16 06/09/2017 1048   CREATININE 1.26 (H) 06/09/2017 1048   CREATININE 1.20 (H) 05/05/2016 0840   CALCIUM 8.9 06/09/2017 1048   PROT 6.7 06/09/2017 1048   PROT 6.4 12/18/2013 0941   ALBUMIN 3.7 06/09/2017 1048   AST 24 06/09/2017 1048   ALT 20 06/09/2017 1048   ALKPHOS 56 06/09/2017 1048   BILITOT 0.9 06/09/2017 1048   GFRNONAA 52 (L) 06/09/2017 1048   GFRAA >60 06/09/2017 1048   RADIOLOGY: I have reviewed the images below and agree with the reported results Study Result   CT HEAD WITHOUT AND WITH CONTRAST 07/01/2016  IMPRESSION: No acute intracranial abnormality. No abnormal enhancement. Unremarkable CT of head for age.   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 R hemicolectomy. Inability to tolerate adjuvant XELODA 02/29/2012 History of prostate cancer. TURP X 2, XRT at [facility] in 2011  Chronic lymphocytic leukemia, flow cytometry on 12/13/2014 with a small monoclonal B-cell population CD5+, CD23+ RAI clinical stage 0 Iron deficiency Hypocalcemia  I personally reviewed and went over laboratory studies with the patient at length. CBC is stable.   NCCN guidelines for surveillance for Colon cancer are as follows (1.2017):  B. Stage II, Stage III 1. H+P every 3-6 months x 2 years and then every 6 months for a total of 5 years  2. CEA every  3-6 months x 2 years and then every 6 months for a total of 5 years  3. CT CAP every 6-12 months (category 2B for frequency < 12 months) for a total of 5 years . 4.  Colonoscopy in 1 year except if no preoperative colonoscopy due to obstructing lesion, colonoscopy in 3-6 months.  A. If advanced adenoma, repeat in 1 year B. If no advanced adenoma, repeat in 3 years, then every 5 years 5. PET/CT scan is not recommended  CT C/A/P on 06/14/17 and it was negative for recurrence. CT reviewed with the patient.   Continue observation at this time, clinically NED from his prostate cancer and colon cancer standpoint. CEA and PSA remain low.  CLL is stable, he remains in RAI stage 0. Has leukocytosis but no evidence of worsening anemia or thrombocytopenia. No indication to initiate treatment at this time.   RTC in 6 months for follow up with CBC, CMP CEA, PSA. I have told him that since he is now 5 years out from his colon cancer without any recurrence, we will no longer be ordering follow up scans.    Orders Placed This Encounter  Procedures  . CBC with Differential    Standing Status:   Future    Standing Expiration Date:   06/16/2018  . Comprehensive metabolic panel    Standing Status:   Future    Standing Expiration Date:   06/16/2018  . CEA    Standing Status:   Future    Standing Expiration Date:   06/16/2018  . PSA    Standing Status:   Future    Standing Expiration Date:   06/16/2018     This note was electronically signed.  Twana First, MD

## 2017-07-13 ENCOUNTER — Other Ambulatory Visit (HOSPITAL_COMMUNITY): Payer: Self-pay | Admitting: Adult Health

## 2017-07-13 ENCOUNTER — Other Ambulatory Visit (HOSPITAL_COMMUNITY): Payer: Self-pay | Admitting: Oncology

## 2017-07-13 DIAGNOSIS — E559 Vitamin D deficiency, unspecified: Secondary | ICD-10-CM

## 2017-07-13 NOTE — Telephone Encounter (Signed)
He can start taking OTC vitamin D 2000 IU daily.  His last vitamin D was checked over 1 year ago and was normal.  His PCP could recheck his vitamin D in the future as well.   Mike Craze, NP [place] [phone]

## 2017-07-13 NOTE — Telephone Encounter (Signed)
Notified patient to stop the Vitamin D 50,000 units and start OTC vitamin D 200 units daily. He verbalized understanding.

## 2017-07-26 ENCOUNTER — Encounter (INDEPENDENT_AMBULATORY_CARE_PROVIDER_SITE_OTHER): Payer: Self-pay | Admitting: Ophthalmology

## 2017-07-26 ENCOUNTER — Ambulatory Visit (INDEPENDENT_AMBULATORY_CARE_PROVIDER_SITE_OTHER): Payer: Medicare Other | Admitting: Ophthalmology

## 2017-07-26 DIAGNOSIS — H34812 Central retinal vein occlusion, left eye, with macular edema: Secondary | ICD-10-CM | POA: Diagnosis not present

## 2017-07-26 DIAGNOSIS — H43813 Vitreous degeneration, bilateral: Secondary | ICD-10-CM

## 2017-07-26 DIAGNOSIS — Z961 Presence of intraocular lens: Secondary | ICD-10-CM

## 2017-07-26 DIAGNOSIS — H35033 Hypertensive retinopathy, bilateral: Secondary | ICD-10-CM

## 2017-07-26 DIAGNOSIS — H35373 Puckering of macula, bilateral: Secondary | ICD-10-CM

## 2017-07-26 NOTE — Progress Notes (Signed)
[facility] Note  07/26/2017     CHIEF COMPLAINT Patient presents for Retina Evaluation   HISTORY OF PRESENT ILLNESS: David Pugh is a 82 y.o. male who presents to the clinic today for:   HPI    Retina Evaluation    In right eye.  This started 6 hours ago.  Associated Symptoms Floaters.  Negative for Flashes, Pain, Trauma, Fever, Redness, Scalp Tenderness, Weight Loss, Distortion, Photophobia, Jaw Claudication, Fatigue, Shoulder/Hip pain, Glare and Blind Spot.  Context:  distance vision, mid-range vision and near vision.  Treatments tried include eye drops.  I, the attending physician,  performed the HPI with the patient and updated documentation appropriately.          Comments    Referral of My eye Doctor/ Dr. Jorja Loa for floater OD. Patient states he woke this am with a large like sting in top of his right eye that" would not go away".  Denies flashes and pain. Pt uses Xalatan qhs. PT takes Multivitamins and probiotic QD       Last edited by Bernarda Caffey, MD on 07/26/2017  2:12 PM. (History)    Pt states that he sees Dr. Zadie Rhine for retina and sees Dr. Jorja Loa for routine care; Pt states that he was dx with CRVO OS x 7-8 years; Pt reports that he was initially dx with CRVO he was unable to get in with retina specialist for 30 days and believes that OS was "too far gone" at that point;Pt states several years ago after having cataract surgery OS he began to loose VA; Pt states that he was being treated with injections and laser surgery but states that there was little to no improvement from treatments; Pt states that he was experiencing a 'c' shaped floater with a string in OD x 9 AM this morning; Pt denies any flashing lights;   Referring physician: Hurman Horn, MD [place], [place] [address]  HISTORICAL INFORMATION:   Selected notes from the MEDICAL RECORD NUMBER Referred by Dr. Zadie Rhine for concern of floater and loss of VA;  LEE-  8.6.18 (G. Rankin) [BCVA OD: 20/25 OS: 200 ECARD @ 62ft] Ocular Hx- pseudophakia OU ([id number] Surgeon: Raliegh Ip. Hunt - in [place]), S/P Yag Cap OD (2.14.17), CRVO OS, AMDOD, CME OS, PVD OU, currently taking latanoprost OU qd; PMH- HTN, arthritis, hx colon ca, high chol    CURRENT MEDICATIONS: Current Outpatient Medications (Ophthalmic Drugs)  Medication Sig  . latanoprost (XALATAN) 0.005 % ophthalmic solution Place 1 drop into both eyes at bedtime.   Marland Kitchen ofloxacin (OCUFLOX) 0.3 % ophthalmic solution Take as directed.   No current facility-administered medications for this visit.  (Ophthalmic Drugs)   Current Outpatient Medications (Other)  Medication Sig  . aspirin EC 81 MG tablet Take 81 mg by mouth daily.  . B Complex Vitamins (VITAMIN B COMPLEX PO) Take 1 tablet by mouth daily.   . cetirizine (ZYRTEC) 10 MG tablet Take 10 mg by mouth daily.  . citalopram (CELEXA) 10 MG tablet Take 20 mg by mouth every morning.   . clopidogrel (PLAVIX) 75 MG tablet TAKE (1) TABLET BY MOUTH ONCE DAILY.  Marland Kitchen Cyanocobalamin (RA VITAMIN B-12 TR) 1000 MCG TBCR Take by mouth.  . folic acid (FOLVITE) 1 MG tablet TAKE (1) TABLET BY MOUTH ONCE DAILY .  Marland Kitchen loperamide (IMODIUM) 2 MG capsule Take 2 mg by mouth as needed for diarrhea or loose stools.  Marland Kitchen losartan (COZAAR) 50 MG  tablet Take 25 mg by mouth 2 (two) times daily.   . metoprolol (LOPRESSOR) 50 MG tablet Take 50 mg by mouth 2 (two) times daily.   . Multiple Vitamin (MULTIVITAMIN WITH MINERALS) TABS tablet Take 1 tablet by mouth daily.  Marland Kitchen NITROSTAT 0.4 MG SL tablet PLACE ONE (1) TABLET UNDER TONGUE EVERY 5 MINUTES UP TO (3) DOSES AS NEEDED FOR CHEST PAIN.  Marland Kitchen pantoprazole (PROTONIX) 40 MG tablet TAKE 2 TABLETS BY MOUTH ONCE DAILY. (Patient taking differently: takes one tablet twice daily)  . pravastatin (PRAVACHOL) 40 MG tablet TAKE 1 TABLET BY MOUTH AT BEDTIME.  . Probiotic Product (PROBIOTIC DAILY PO) Take 1 capsule by mouth daily.  Marland Kitchen UNABLE TO FIND Take 1 capsule  by mouth once. Med Name: Instaflex  . Vitamin D, Ergocalciferol, (DRISDOL) 50000 units CAPS capsule TAKE 1 CAPSULE TWICE A WEEK  . isosorbide mononitrate (IMDUR) 30 MG 24 hr tablet TAKE (1) TABLET BY MOUTH ONCE DAILY. (Patient not taking: Reported on 07/26/2017)   No current facility-administered medications for this visit.  (Other)      REVIEW OF SYSTEMS: ROS    Positive for: Musculoskeletal, Cardiovascular, Eyes, Allergic/Imm, Heme/Lymph   Negative for: Constitutional, Gastrointestinal, Neurological, Skin, Genitourinary, HENT, Endocrine, Respiratory, Psychiatric   Last edited by Zenovia Jordan, LPN on 21/30/8657  8:46 PM. (History)       ALLERGIES Allergies  Allergen Reactions  . Shellfish Allergy Anaphylaxis  . Sulfa Antibiotics Diarrhea and Nausea Only  . Sulfonamide Derivatives Diarrhea and Nausea Only    PAST MEDICAL HISTORY Past Medical History:  Diagnosis Date  . Abnormality of gait 12/18/2013  . Anemia    hx of anemia as a child   . Arthritis   . Biliary dyskinesia   . CAD (coronary artery disease) 2012   a. hx of stent x2 in ramus/OM and mid LAD in 2012  b. balloon angioplaty of OM  . Colon cancer ([place]) 12/2011   s/p right hemicolectomy, did not tolerate chemo  . Colon polyps    tubular adenomas  . Diverticula of colon 2010  . Exposure to [employer]   . Family history of prostate cancer   . Family history of stomach cancer   . Folic acid deficiency   . Gastroparesis 04/12/2013   borderline  . GERD (gastroesophageal reflux disease)   . Glaucoma   . Heart palpitations 10/2012  . Hiatal hernia 2010   tiny  . HTN (hypertension)   . Iron deficiency 05/02/2012   followed by Dr. Tressie [name], iron infusions.   . Myocardial infarction ([place]) 2012  . Peripheral neuropathy   . Prostate cancer [facility]) 2010   s/p radiation, surgery  . PTSD (post-traumatic stress disorder)   . Radiation Feb 2011   treatment  . Radiation proctitis   . Rosacea    Past Surgical  History:  Procedure Laterality Date  . CARDIAC CATHETERIZATION  02/13/2011   [facility] cardiology  . CATARACT EXTRACTION     2011  . CATARACT EXTRACTION W/PHACO Right 08/06/2013   Procedure: CATARACT EXTRACTION PHACO AND INTRAOCULAR LENS PLACEMENT (IOC);  Surgeon: Tonny Branch, MD;  Location: [facility] ORS;  Service: Ophthalmology;  Laterality: Right;  CDE:20.96  . CHOLECYSTECTOMY    . COLONOSCOPY  02/12/2004   Dr. Gala Romney- L side diverticula, inflammatory  colon polyps  . COLONOSCOPY  01/19/2012   RMR: Prominent changes involving the rectal mucosa consistent with radiation-induced proctitis. Multiple colonic  polyps removed as described above. Sigmoid Diverticulosis/ 1.5 x 2 cm  relatively flat ulcerated lesion in cecum/path showed adenocarcinoma of the colon. descending colon polyp with tubular adenoma and one fragment of focal high grade dysplasia.   . COLONOSCOPY  08/28/2012   DPO:EUMPNTIRW proctitis. Colonic diverticulosis/Ulcerations at the surgical anastomosis  path: ulcerated colonic mucosa with prolapse changes, tubular adenoma.   . COLONOSCOPY N/A 08/02/2013   RMR: Colonic polyps -removed as described above. Colonic diverticulosis. Status post reight hemicolectomy. Radiation proctitis- asymptomatic.   Marland Kitchen COLONOSCOPY N/A 05/09/2015   Procedure: COLONOSCOPY;  Surgeon: Daneil Dolin, MD;  Location: [facility] ENDO SUITE;  Service: Endoscopy;  Laterality: N/A;  1200-moved to [address]5 [address] to notify pt  . CORONARY ANGIOPLASTY  06/17/14   OM1 POBA  . CORONARY STENT PLACEMENT  01/2011   2.5x106mm Xience drug-eluting stent in ramus intermedius/OMI vessel, 2.5x53mm Promus Element stent in mid LAD artery  . ESOPHAGOGASTRODUODENOSCOPY  10/10/2008   Dr. Tilda Burrow hiatal hernia, normal esophagus, normal stomach  . ESOPHAGOGASTRODUODENOSCOPY  02/12/2004   Dr. Dudley Major- normal   . ESOPHAGOGASTRODUODENOSCOPY N/A 05/09/2015   Procedure: ESOPHAGOGASTRODUODENOSCOPY (EGD);  Surgeon: Daneil Dolin, MD;  Location: [facility];   Service: Endoscopy;  Laterality: N/A;  . GIVENS CAPSULE STUDY N/A 06/03/2015   Procedure: GIVENS CAPSULE STUDY;  Surgeon: Daneil Dolin, MD;  Location: [facility] END[facility] SUITE;  Service: Endoscopy;  Laterality: N/A;  0700  . KNEE SURGERY     left knee   . LEFT HEART CATHETERIZATION WITH CORONARY ANGIOGRAM N/A 06/17/2014   Procedure: LEFT HEART CATHETERIZATION WITH CORONARY ANGIOGRAM;  Surgeon: Burnell Blanks, MD;  Location: [facility] CATH LAB;  Service: Cardiovascular;  Laterality: N/A;  . PARTIAL COLECTOMY  02/29/2012   Procedure: PARTIAL COLECTOMY;  Surgeon: Adin Hector, MD;  Location: [facility] ORS;  Service: General;  Laterality: Right;  . PROSTATECTOMY    . TONSILLECTOMY    . TRANSURETHRAL RESECTION OF PROSTATE    . VASECTOMY      FAMILY HISTORY Family History  Problem Relation Age of Onset  . Throat cancer Father        dx in his 83s and again in his 73s; pipe and cigar smoker  . Neuropathy Brother   . Prostate cancer Brother 57  . Melanoma Brother        dx in his 32s  . Cancer Paternal Uncle        smoking related cancer  . Heart attack Paternal Grandfather   . Stomach cancer Cousin 2  . Colon cancer Neg Hx     SOCIAL HISTORY Social History   Tobacco Use  . Smoking status: Never Smoker  . Smokeless tobacco: Never Used  Substance Use Topics  . Alcohol use: Yes    Comment: 1 cocktail/month  . Drug use: No         OPHTHALMIC EXAM:  Base Eye Exam    Visual Acuity (Snellen - Linear)      Right Left   Dist cc 20/25 +1 CF at 3'   Dist ph cc 20/20 -2 NI   Correction:  Glasses       Tonometry (Tonopen, 1:44 PM)      Right Left   Pressure 14 13       Pupils      Dark Light Shape React APD   Right 4 3 Round Brisk None   Left 4 3 Round Brisk +2       Visual Fields (Counting fingers)      Left Right     Full  Restrictions Partial outer superior temporal deficiency        Extraocular Movement      Right Left    Full, Ortho Full, Ortho       Neuro/Psych     Oriented x3:  Yes   Mood/Affect:  Normal       Dilation    Both eyes:  1.0% Mydriacyl, 2.5% Phenylephrine @ 1:44 PM        Slit Lamp and Fundus Exam    Slit Lamp Exam      Right Left   Lids/Lashes Dermatochalasis - upper lid Dermatochalasis - upper lid   Conjunctiva/Sclera White and quiet White and quiet   Cornea 1+ Punctate epithelial erosions, Arcus 1+ Punctate epithelial erosions, Arcus   Anterior Chamber Deep and quiet Deep and quiet   Iris Round and dilated Round and dilated   Lens Posterior chamber intraocular lens in good position, open PC Posterior chamber intraocular lens in good position, open PC   Vitreous Posterior vitreous detachment, Weiss ring Posterior vitreous detachment       Fundus Exam      Right Left   Disc Normal Normal, positive collateral vessles   C/D Ratio 0.4 0.4   Macula Flat, trace Retinal pigment epithelial mottling Flat, central atrophy, Microaneurysms, pigment clumping, cystic changes   Vessels Mild Vascular attenuation Vascular attenuation with dilated veins, positive collateral vessels on disc   Periphery Attached, mild Reticular degeneration attached, 360 PRP laser        Refraction    Wearing Rx      Sphere Cylinder Axis Add   Right -0.75 +0.75 155 +2.50   Left [place]   +2.50   Age:  34yrs   Type:  PAL       Manifest Refraction (Retinoscopy)      Sphere Cylinder Axis Dist VA   Right -0.75 +0.50 020 20/20-2   Left -1.00 +2.00 088 CF          IMAGING AND PROCEDURES  Imaging and Procedures for 07/26/17  OCT, Retina - OU - Both Eyes     Right Eye Central Foveal Thickness: 283. Progression has no prior data. Findings include normal foveal contour, no SRF, no IRF, epiretinal membrane (Trace ERM).   Left Eye Central Foveal Thickness: 252. Progression has no prior data. Findings include abnormal foveal contour, intraretinal fluid, outer retinal atrophy, subretinal hyper-reflective material, no SRF, epiretinal membrane.    Notes Images taken, stored on drive  Diagnosis / Impression:  OD: trace ERM, NFP, No IRF, No SRF OS: CRVO with CME and outer retinal atrophy, trace ERM  Clinical management:  See below  Abbreviations: NFP - Normal foveal profile. CME - cystoid macular edema. PED - pigment epithelial detachment. IRF - intraretinal fluid. SRF - subretinal fluid. EZ - ellipsoid zone. ERM - epiretinal membrane. ORA - outer retinal atrophy. ORT - outer retinal tubulation. SRHM - subretinal hyper-reflective material                  ASSESSMENT/PLAN:    ICD-10-CM   1. Posterior vitreous detachment of both eyes H43.813   2. Central retinal vein occlusion with macular edema of left eye H34.8120 OCT, Retina - OU - Both Eyes  3. Hypertensive retinopathy of both eyes H35.033   4. Pseudophakia of both eyes Z96.1    1. PVD / vitreous syneresis OU  Acute onset PVD OD  Discussed findings and prognosis  No RT or RD on 360 scleral depressed exam  Reviewed  s/s of RT/RD  Strict return precautions for any such RT/RD signs/symptoms  F/U in 1 week or sooner if symptoms worsen  2. History of CRVO OS with CME, atrophy and low vision  Onset years ago  Per pt, received injections for 1-2 years with minimal benefit w/ last injection being last year by Dr. Zadie Rhine  S/p PRP OS w/ Dr. Zadie Rhine  Residual IRF present, but vision mostly limited by central/foveal atrophy  Discussed findings and prognosis  Do not recommend treatment at this time  monitor  3. Hypertensive retinopathy OU  discussed importance of tight BP control  Monitor  4. Epiretinal membrane, both eyes  The natural history, anatomy, potential for loss of vision, and treatment options including vitrectomy techniques and the complications of endophthalmitis, retinal detachment, vitreous hemorrhage, cataract progression and permanent vision loss discussed with the patient. - mild ERMs with minimal visual sigificance at this time - no indication for  surgery at this time - monitor  5. Pseudophakia OU  - s/p CE/IOL  - beautiful surgery, doing well  - monitor    Ophthalmic Meds Ordered this visit:  No orders of the defined types were placed in this encounter.      Return in about 1 week (around 08/02/2017) for F/U PVD OD.  There are no Patient Instructions on file for this visit.   Explained the diagnoses, plan, and follow up with the patient and they expressed understanding.  Patient expressed understanding of the importance of proper follow up care.   Gardiner Sleeper, M.D., Ph.D. Diseases & Surgery of the Retina and Vitreous [facility] 07/26/17     Abbreviations: M myopia (nearsighted); A astigmatism; H hyperopia (farsighted); P presbyopia; Mrx spectacle prescription;  CTL contact lenses; OD right eye; OS left eye; OU both eyes  XT exotropia; ET esotropia; PEK punctate epithelial keratitis; PEE punctate epithelial erosions; DES dry eye syndrome; MGD meibomian gland dysfunction; ATs artificial tears; PFAT's preservative free artificial tears; [place] nuclear sclerotic cataract; PSC posterior subcapsular cataract; ERM epi-retinal membrane; PVD posterior vitreous detachment; RD retinal detachment; DM diabetes mellitus; DR diabetic retinopathy; NPDR non-proliferative diabetic retinopathy; PDR proliferative diabetic retinopathy; CSME clinically significant macular edema; DME diabetic macular edema; dbh dot blot hemorrhages; CWS cotton wool spot; POAG primary open angle glaucoma; C/D cup-to-disc ratio; HVF humphrey visual field; GVF goldmann visual field; OCT optical coherence tomography; IOP intraocular pressure; BRVO Branch retinal vein occlusion; CRVO central retinal vein occlusion; CRAO central retinal artery occlusion; BRAO branch retinal artery occlusion; RT retinal tear; SB scleral buckle; PPV pars plana vitrectomy; VH Vitreous hemorrhage; PRP panretinal laser photocoagulation; IVK intravitreal kenalog; VMT  vitreomacular traction; MH Macular hole;  NVD neovascularization of the disc; NVE neovascularization elsewhere; AREDS age related eye disease study; ARMD age related macular degeneration; POAG primary open angle glaucoma; EBMD epithelial/anterior basement membrane dystrophy; ACIOL anterior chamber intraocular lens; IOL intraocular lens; PCIOL posterior chamber intraocular lens; Phaco/IOL phacoemulsification with intraocular lens placement; [place] photorefractive keratectomy; LASIK laser assisted in situ keratomileusis; HTN hypertension; DM diabetes mellitus; COPD chronic obstructive pulmonary disease

## 2017-08-01 NOTE — Progress Notes (Signed)
[facility] Note  08/02/2017     CHIEF COMPLAINT Patient presents for Retina Follow Up   HISTORY OF PRESENT ILLNESS: David Pugh is a 82 y.o. male who presents to the clinic today for:   HPI    Retina Follow Up    In left eye.  This started years ago.  Severity is mild.  Duration of 24 hours.  Since onset it is stable.  I, the attending physician,  performed the HPI with the patient and updated documentation appropriately.          Comments    F/U PVD OU. Patient states his floaters OU  have become less noticeable("Maybe I'm getting uses to them" ) Pt reports VA is stable. Patient is using Latanoprost  gtts at hs. Takes centrum Vit qd       Last edited by Bernarda Caffey, MD on 08/02/2017  1:50 PM. (History)    Pt reports that his floater is "less noticeable" since last visit; Pt reports that he has had no new floaters of flashes since last visit;   Referring physician: [name]., MD [place], [place] [address]  HISTORICAL INFORMATION:   Selected notes from the MEDICAL RECORD NUMBER Referred by Dr. Zadie Rhine for concern of floater and loss of VA;  LEE- 8.6.18 (G. Rankin) [BCVA OD: 20/25 OS: 200 ECARD @ 30ft] Ocular Hx- pseudophakia OU (0630-1601 Surgeon: Raliegh Ip. Hunt - in [place]), S/P Yag Cap OD (2.14.17), CRVO OS, AMDOD, CME OS, PVD OU, currently taking latanoprost OU qd; PMH- HTN, arthritis, hx colon ca, high chol    CURRENT MEDICATIONS: Current Outpatient Medications (Ophthalmic Drugs)  Medication Sig  . latanoprost (XALATAN) 0.005 % ophthalmic solution Place 1 drop into both eyes at bedtime.   Marland Kitchen ofloxacin (OCUFLOX) 0.3 % ophthalmic solution Take as directed.   No current facility-administered medications for this visit.  (Ophthalmic Drugs)   Current Outpatient Medications (Other)  Medication Sig  . aspirin EC 81 MG tablet Take 81 mg by mouth daily.  . B Complex Vitamins (VITAMIN B COMPLEX PO) Take 1 tablet by mouth  daily.   . cetirizine (ZYRTEC) 10 MG tablet Take 10 mg by mouth daily.  . citalopram (CELEXA) 10 MG tablet Take 20 mg by mouth every morning.   . clopidogrel (PLAVIX) 75 MG tablet TAKE (1) TABLET BY MOUTH ONCE DAILY.  Marland Kitchen Cyanocobalamin (RA VITAMIN B-12 TR) 1000 MCG TBCR Take by mouth.  . folic acid (FOLVITE) 1 MG tablet TAKE (1) TABLET BY MOUTH ONCE DAILY .  . isosorbide mononitrate (IMDUR) 30 MG 24 hr tablet TAKE (1) TABLET BY MOUTH ONCE DAILY.  Marland Kitchen loperamide (IMODIUM) 2 MG capsule Take 2 mg by mouth as needed for diarrhea or loose stools.  Marland Kitchen losartan (COZAAR) 50 MG tablet Take 25 mg by mouth 2 (two) times daily.   . metoprolol (LOPRESSOR) 50 MG tablet Take 50 mg by mouth 2 (two) times daily.   . Multiple Vitamin (MULTIVITAMIN WITH MINERALS) TABS tablet Take 1 tablet by mouth daily.  . multivitamin-iron-minerals-folic acid (CENTRUM) chewable tablet Chew 1 tablet by mouth daily.  Marland Kitchen NITROSTAT 0.4 MG SL tablet PLACE ONE (1) TABLET UNDER TONGUE EVERY 5 MINUTES UP TO (3) DOSES AS NEEDED FOR CHEST PAIN.  Marland Kitchen pantoprazole (PROTONIX) 40 MG tablet TAKE 2 TABLETS BY MOUTH ONCE DAILY. (Patient taking differently: takes one tablet twice daily)  . pravastatin (PRAVACHOL) 40 MG tablet TAKE 1 TABLET BY MOUTH AT  BEDTIME.  . Probiotic Product (PROBIOTIC DAILY PO) Take 1 capsule by mouth daily.  Marland Kitchen UNABLE TO FIND Take 1 capsule by mouth once. Med Name: Instaflex  . Vitamin D, Ergocalciferol, (DRISDOL) 50000 units CAPS capsule TAKE 1 CAPSULE TWICE A WEEK   No current facility-administered medications for this visit.  (Other)      REVIEW OF SYSTEMS: ROS    Positive for: Constitutional, Gastrointestinal, Genitourinary, Musculoskeletal, Cardiovascular, Eyes, Heme/Lymph   Negative for: Neurological, Skin, HENT, Endocrine, Respiratory, Psychiatric, Allergic/Imm   Last edited by Zenovia Jordan, LPN on 59/12/6385  5:64 PM. (History)       ALLERGIES Allergies  Allergen Reactions  . Shellfish Allergy  Anaphylaxis  . Sulfa Antibiotics Diarrhea and Nausea Only  . Sulfonamide Derivatives Diarrhea and Nausea Only    PAST MEDICAL HISTORY Past Medical History:  Diagnosis Date  . Abnormality of gait 12/18/2013  . Anemia    hx of anemia as a child   . Arthritis   . Biliary dyskinesia   . CAD (coronary artery disease) 2012   a. hx of stent x2 in ramus/OM and mid LAD in 2012  b. balloon angioplaty of OM  . Colon cancer ([place]) 12/2011   s/p right hemicolectomy, did not tolerate chemo  . Colon polyps    tubular adenomas  . Diverticula of colon 2010  . Exposure to [employer]   . Family history of prostate cancer   . Family history of stomach cancer   . Folic acid deficiency   . Gastroparesis 04/12/2013   borderline  . GERD (gastroesophageal reflux disease)   . Glaucoma   . Heart palpitations 10/2012  . Hiatal hernia 2010   tiny  . HTN (hypertension)   . Iron deficiency 05/02/2012   followed by Dr. Tressie [name], iron infusions.   . Myocardial infarction ([place]) 2012  . Peripheral neuropathy   . Prostate cancer [facility]) 2010   s/p radiation, surgery  . PTSD (post-traumatic stress disorder)   . Radiation Feb 2011   treatment  . Radiation proctitis   . Rosacea    Past Surgical History:  Procedure Laterality Date  . CARDIAC CATHETERIZATION  02/13/2011   [facility] cardiology  . CATARACT EXTRACTION     2011  . CATARACT EXTRACTION W/PHACO Right 08/06/2013   Procedure: CATARACT EXTRACTION PHACO AND INTRAOCULAR LENS PLACEMENT (IOC);  Surgeon: Tonny Branch, MD;  Location: [facility] ORS;  Service: Ophthalmology;  Laterality: Right;  CDE:20.96  . CHOLECYSTECTOMY    . COLONOSCOPY  02/12/2004   Dr. Gala Romney- L side diverticula, inflammatory  colon polyps  . COLONOSCOPY  01/19/2012   RMR: Prominent changes involving the rectal mucosa consistent with radiation-induced proctitis. Multiple colonic  polyps removed as described above. Sigmoid Diverticulosis/ 1.5 x 2 cm relatively flat ulcerated lesion in  cecum/path showed adenocarcinoma of the colon. descending colon polyp with tubular adenoma and one fragment of focal high grade dysplasia.   . COLONOSCOPY  08/28/2012   PPI:RJJOACZYS proctitis. Colonic diverticulosis/Ulcerations at the surgical anastomosis  path: ulcerated colonic mucosa with prolapse changes, tubular adenoma.   . COLONOSCOPY N/A 08/02/2013   RMR: Colonic polyps -removed as described above. Colonic diverticulosis. Status post reight hemicolectomy. Radiation proctitis- asymptomatic.   Marland Kitchen COLONOSCOPY N/A 05/09/2015   Procedure: COLONOSCOPY;  Surgeon: Daneil Dolin, MD;  Location: [facility] SU[facility];  Service: Endoscopy;  Laterality: N/A;  1200-moved to [address] to notify pt  . CORONARY ANGIOPLASTY  06/17/14   OM1 POBA  . CORONARY STENT PLACEMENT  01/2011   2.5x69mm Xience drug-eluting stent in ramus intermedius/OMI vessel, 2.5x89mm Promus Element stent in mid LAD artery  . ESOPHAGOGASTRODUODENOSCOPY  10/10/2008   Dr. Tilda Burrow hiatal hernia, normal esophagus, normal stomach  . ESOPHAGOGASTRODUODENOSCOPY  02/12/2004   Dr. Dudley Major- normal   . ESOPHAGOGASTRODUODENOSCOPY N/A 05/09/2015   Procedure: ESOPHAGOGASTRODUODENOSCOPY (EGD);  Surgeon: Daneil Dolin, MD;  Location: [facility];  Service: Endoscopy;  Laterality: N/A;  . GIVENS CAPSULE STUDY N/A 06/03/2015   Procedure: GIVENS CAPSULE STUDY;  Surgeon: Daneil Dolin, MD;  Location: [facility];  Service: Endoscopy;  Laterality: N/A;  0700  . KNEE SURGERY     left knee   . LEFT HEART CATHETERIZATION WITH CORONARY ANGIOGRAM N/A 06/17/2014   Procedure: LEFT HEART CATHETERIZATION WITH CORONARY ANGIOGRAM;  Surgeon: Burnell Blanks, MD;  [facility] C[facility] LAB;  Service: Cardiovascular;  Laterality: N/A;  . PARTIAL COLECTOMY  02/29/2012   Procedure: PARTIAL COLECTOMY;  Surgeon: Adin Hector, MD;  Location: [facility] ORS;  Service: General;  Laterality: Right;  . PROSTATECTOMY    . TONSILLECTOMY    . TRANSURETHRAL RESECTION OF PROSTATE     . VASECTOMY      FAMILY HISTORY Family History  Problem Relation Age of Onset  . Throat cancer Father        dx in his 6s and again in his 27s; pipe and cigar smoker  . Neuropathy Brother   . Prostate cancer Brother 60  . Melanoma Brother        dx in his 59s  . Cancer Paternal Uncle        smoking related cancer  . Heart attack Paternal Grandfather   . Stomach cancer Cousin 14  . Colon cancer Neg Hx     SOCIAL HISTORY Social History   Tobacco Use  . Smoking status: Never Smoker  . Smokeless tobacco: Never Used  Substance Use Topics  . Alcohol use: Yes    Comment: 1 cocktail/month  . Drug use: No         OPHTHALMIC EXAM:  Base Eye Exam    Visual Acuity (Snellen - Linear)      Right Left   Dist cc 20/20-1 20/CF   Dist ph cc 20/NI 20/400   Correction:  Glasses       Tonometry (Tonopen, 1:28 PM)      Right Left   Pressure 17 14       Pupils      Dark Light Shape React APD   Right 3 2 Round Minimal None   Left 3 2 Round Sluggish +2       Visual Fields (Counting fingers)      Left Right    Full Full       Extraocular Movement      Right Left    Full, Ortho Full, Ortho       Neuro/Psych    Oriented x3:  Yes   Mood/Affect:  Normal       Dilation    Both eyes:  2.5% Phenylephrine @ 1:28 PM        Slit Lamp and Fundus Exam    Slit Lamp Exam      Right Left   Lids/Lashes Dermatochalasis - upper lid Dermatochalasis - upper lid   Conjunctiva/Sclera White and quiet White and quiet   Cornea 1+ Punctate epithelial erosions, Arcus 1+ Punctate epithelial erosions, Arcus   Anterior Chamber Deep and quiet Deep and quiet   Iris Round and  dilated Round and dilated   Lens Posterior chamber intraocular lens in good position, open PC Posterior chamber intraocular lens in good position, open PC   Vitreous Posterior vitreous detachment, prominent Weiss ring Posterior vitreous detachment       Fundus Exam      Right Left   Disc Normal Normal, positive  collateral vessles   C/D Ratio 0.4 0.4   Macula Flat, trace Retinal pigment epithelial mottling Flat, central atrophy, Microaneurysms, pigment clumping, cystic changes   Vessels Mild Vascular attenuation Vascular attenuation with dilated veins, positive collateral vessels on disc   Periphery Attached, mild Reticular degeneration attached, 360 PRP laser        Refraction    Wearing Rx      Sphere Cylinder Axis Add   Right -0.50 +0.75 153 +2.25   Left +0.00      Age:  7 yr          IMAGING AND PROCEDURES  Imaging and Procedures for 08/02/17  OCT, Retina - OU - Both Eyes     Right Eye Quality was good. Central Foveal Thickness: 284. Progression has been stable. Findings include normal foveal contour, no SRF, no IRF, epiretinal membrane (Trace ERM).   Left Eye Quality was good. Central Foveal Thickness: 257. Progression has been stable. Findings include abnormal foveal contour, intraretinal fluid, outer retinal atrophy, subretinal hyper-reflective material, no SRF, epiretinal membrane.   Notes Images taken, stored on drive  Diagnosis / Impression:  OD: trace ERM, NFP, No IRF, No SRF OS: CRVO with CME and outer retinal atrophy, trace ERM  Clinical management:  See below  Abbreviations: NFP - Normal foveal profile. CME - cystoid macular edema. PED - pigment epithelial detachment. IRF - intraretinal fluid. SRF - subretinal fluid. EZ - ellipsoid zone. ERM - epiretinal membrane. ORA - outer retinal atrophy. ORT - outer retinal tubulation. SRHM - subretinal hyper-reflective material                  ASSESSMENT/PLAN:    ICD-10-CM   1. Posterior vitreous detachment of both eyes H43.813   2. Central retinal vein occlusion with macular edema of left eye H34.8120 OCT, Retina - OU - Both Eyes  3. Hypertensive retinopathy of both eyes H35.033   4. Epiretinal membrane (ERM) of both eyes H35.373 OCT, Retina - OU - Both Eyes  5. Pseudophakia of both eyes Z96.1    1. PVD /  vitreous syneresis OU  Acute onset PVD OD  Discussed findings and prognosis  No RT or RD on repeat 360 peripheral exam  Reviewed s/s of RT/RD  Strict return precautions for any such RT/RD signs/symptoms  F/U 4-6 wks - repeat scleral depression at this vist  2. History of CRVO OS with CME, atrophy and low vision  Onset years ago  Per pt, received injections for 1-2 years with minimal benefit w/ last injection being last year by Dr. Zadie Rhine  S/p PRP OS w/ Dr. Zadie Rhine  Residual IRF present, but vision mostly limited by central/foveal atrophy  Discussed findings and prognosis  stable  Do not recommend treatment at this time  monitor  3. Hypertensive retinopathy OU  discussed importance of tight BP control  Monitor  4. Epiretinal membrane, both eyes  The natural history, anatomy, potential for loss of vision, and treatment options including vitrectomy techniques and the complications of endophthalmitis, retinal detachment, vitreous hemorrhage, cataract progression and permanent vision loss discussed with the patient. - mild ERMs with minimal visual sigificance at  this time - no indication for surgery at this time - monitor  5. Pseudophakia OU  - s/p CE/IOL  - beautiful surgery, doing well  - monitor    Ophthalmic Meds Ordered this visit:  No orders of the defined types were placed in this encounter.      Return in about 5 weeks (around 09/06/2017) for 4-6 wks, Dilated Exam, OCT, f/u PVD OU with scleral depressed exam.  There are no Patient Instructions on file for this visit.   Explained the diagnoses, plan, and follow up with the patient and they expressed understanding.  Patient expressed understanding of the importance of proper follow up care.   This document serves as a record of services personally performed by Gardiner Sleeper, MD, PhD. It was created on their behalf by Catha Brow, [place], a certified ophthalmic assistant. The creation of this record is the provider's  dictation and/or activities during the visit.  Electronically signed by: Catha Brow, COA  08/02/17 1:58 PM     Gardiner Sleeper, M.D., Ph.D. Diseases & Surgery of the Retina and [place] 08/02/17   I have reviewed the above documentation for accuracy and completeness, and I agree with the above. Gardiner Sleeper, M.D., Ph.D. 08/02/17 1:59 PM     Abbreviations: M myopia (nearsighted); A astigmatism; H hyperopia (farsighted); P presbyopia; Mrx spectacle prescription;  CTL contact lenses; OD right eye; OS left eye; OU both eyes  XT exotropia; ET esotropia; PEK punctate epithelial keratitis; PEE punctate epithelial erosions; DES dry eye syndrome; MGD meibomian gland dysfunction; ATs artificial tears; PFAT's preservative free artificial tears; [place] nuclear sclerotic cataract; PSC posterior subcapsular cataract; ERM epi-retinal membrane; PVD posterior vitreous detachment; RD retinal detachment; DM diabetes mellitus; DR diabetic retinopathy; NPDR non-proliferative diabetic retinopathy; PDR proliferative diabetic retinopathy; CSME clinically significant macular edema; DME diabetic macular edema; dbh dot blot hemorrhages; CWS cotton wool spot; POAG primary open angle glaucoma; C/D cup-to-disc ratio; HVF humphrey visual field; GVF goldmann visual field; OCT optical coherence tomography; IOP intraocular pressure; BRVO Branch retinal vein occlusion; CRVO central retinal vein occlusion; CRAO central retinal artery occlusion; BRAO branch retinal artery occlusion; RT retinal tear; SB scleral buckle; PPV pars plana vitrectomy; VH Vitreous hemorrhage; PRP panretinal laser photocoagulation; IVK intravitreal kenalog; VMT vitreomacular traction; MH Macular hole;  NVD neovascularization of the disc; NVE neovascularization elsewhere; AREDS age related eye disease study; ARMD age related macular degeneration; POAG primary open angle glaucoma; EBMD epithelial/anterior basement  membrane dystrophy; ACIOL anterior chamber intraocular lens; IOL intraocular lens; PCIOL posterior chamber intraocular lens; Phaco/IOL phacoemulsification with intraocular lens placement; Loco photorefractive keratectomy; LASIK laser assisted in situ keratomileusis; HTN hypertension; DM diabetes mellitus; COPD chronic obstructive pulmonary disease

## 2017-08-02 ENCOUNTER — Ambulatory Visit (INDEPENDENT_AMBULATORY_CARE_PROVIDER_SITE_OTHER): Payer: Medicare Other | Admitting: Ophthalmology

## 2017-08-02 ENCOUNTER — Encounter (INDEPENDENT_AMBULATORY_CARE_PROVIDER_SITE_OTHER): Payer: Self-pay | Admitting: Ophthalmology

## 2017-08-02 DIAGNOSIS — H34812 Central retinal vein occlusion, left eye, with macular edema: Secondary | ICD-10-CM | POA: Diagnosis not present

## 2017-08-02 DIAGNOSIS — H35033 Hypertensive retinopathy, bilateral: Secondary | ICD-10-CM | POA: Diagnosis not present

## 2017-08-02 DIAGNOSIS — H43813 Vitreous degeneration, bilateral: Secondary | ICD-10-CM

## 2017-08-02 DIAGNOSIS — Z961 Presence of intraocular lens: Secondary | ICD-10-CM

## 2017-08-02 DIAGNOSIS — H35373 Puckering of macula, bilateral: Secondary | ICD-10-CM | POA: Diagnosis not present

## 2017-08-18 ENCOUNTER — Encounter: Payer: Self-pay | Admitting: Nurse Practitioner

## 2017-08-18 ENCOUNTER — Ambulatory Visit (INDEPENDENT_AMBULATORY_CARE_PROVIDER_SITE_OTHER): Payer: Medicare Other | Admitting: Nurse Practitioner

## 2017-08-18 VITALS — BP 123/72 | HR 67 | Temp 98.2°F | Ht 71.0 in | Wt 220.4 lb

## 2017-08-18 DIAGNOSIS — R630 Anorexia: Secondary | ICD-10-CM | POA: Insufficient documentation

## 2017-08-18 DIAGNOSIS — Z85038 Personal history of other malignant neoplasm of large intestine: Secondary | ICD-10-CM

## 2017-08-18 DIAGNOSIS — R195 Other fecal abnormalities: Secondary | ICD-10-CM | POA: Diagnosis not present

## 2017-08-18 DIAGNOSIS — R634 Abnormal weight loss: Secondary | ICD-10-CM | POA: Diagnosis not present

## 2017-08-18 NOTE — Assessment & Plan Note (Signed)
The patient notes a somewhat trivial weight loss of about 2-3 pounds in the past 2-3 weeks in the setting of decreased appetite which he feels is due to stress.  Further management as per above.  He is due for colonoscopy in mid 2019.  Return for follow-up in 6-8 weeks for check and in consideration of early interval endoscopic evaluation.

## 2017-08-18 NOTE — Progress Notes (Signed)
CC'D TO PCP °

## 2017-08-18 NOTE — Patient Instructions (Signed)
1. When you do your stool test for blood, bring it back to our office. 2. We will have you come back in 6-8 weeks to check your symptoms and review your results. 3. We can consider possibly repeating her colonoscopy early, based on how you are doing. 4. Call if you have any questions or concerns.

## 2017-08-18 NOTE — Assessment & Plan Note (Signed)
The patient admits dark stools, "almost black."  His stools are still solid and not sticky.  Query possible melena.  However, he does take a multivitamin with iron.  At this point we will check an iFobT for blood.  Return for follow-up in 6-8 weeks to review results, checking on patient's symptoms, consider endoscopic evaluation if needed.

## 2017-08-18 NOTE — Assessment & Plan Note (Signed)
History of a sending colon cancer status post surgical resection and did not tolerate chemotherapy.  He has been 5 years without recurrence or metastasis.  He is set to no longer undergo routine scans by oncology due to this timeframe.  His last colonoscopy was about 2-1/2 years ago.  He is due in May 2019 for multiple tubular adenoma polyps.  Some weight loss and decreased appetite.  We will have him return after monitoring his symptoms to consider possible early interval endoscopic evaluation.

## 2017-08-18 NOTE — Assessment & Plan Note (Signed)
The patient notes decreased appetite in the past 2 weeks.  It was sudden onset.  He does admit increased stress in his home with his mom having shingles and his wife, who is caring for his mom, developing shingles as well.  He thinks this is related to his weight loss.  He does have a history of 3 separate cancers.  This includes colorectal cancer.  His last colonoscopy was about 2-1/2 years ago and will be due in mid 2019.  There is precedence for early interval colonoscopy pending results and monitoring.  Return for follow-up in 6-8 weeks to check his weight, appetite, and consider endoscopic evaluation if needed.

## 2017-08-18 NOTE — Progress Notes (Signed)
Referring Provider: [name]., MD Primary Care Physician:  [name]., MD Primary GI:  Dr. Gala Romney   Chief Complaint  Patient presents with  . Colonoscopy    consult, stools darker than usual  . loss of appetite    HPI:   David Pugh is a 82 y.o. male who presents for loss of appetite and consider colonoscopy.  Patient was last seen in our office 04/18/2015 for history of colon cancer, GERD, iron deficiency anemia.  He is followed by oncology for IDA. History of stage III colorectal cancer of the ascending colon status post right hemicolectomy in July 2013. Did not tolerate adjuvant Xeloda. History of prostate cancer 2011 status post surgery and radiation. Diagnosed with chronic lymphocytic leukemia in April 2016 currently being monitored.  At his last visit he noted iron deficiency anemia requiring iron transfusions, does not tolerate oral iron due to GI upset.  No rectal bleeding, regular bowel movements, no abdominal pain.  Triple-vessel CAD on admission for chest pain with stents to mid LAD and proximal intermediate branch.  Medical management with aspirin and Plavix.  His heartburn was well controlled at that time and was on daily PPI therapy.  Given multiple malignancies and iron deficiency anemia patient was recommended for colonoscopy and endoscopy.  EGD completed 05/09/2015 which found esophagus consistent with Barrett's, gastric erosions, gastric polyps status post biopsy.  Surgical pathology found the biopsies to be active gastropathy and chronic inflammation.  Colonoscopy completed the same day found status post right hemicolectomy, radiation proctitis asymptomatic, colonic diverticulosis, 3 colon polyps removed.  Surgical pathology found the polyps to be tubular adenoma.  Recommended 3-year repeat colonoscopy.   Patient underwent capsule study as well.  Nonspecific abnormalities of small bowel mucosa were found.  Likely trivial antral erosions.  No obvious tumor or  overt bleeding lesions.  Iron deficiency anemia deemed likely multifactorial due to high-dose acid suppression therapy, slow ooze from the GI tract related to Plavix and aspirin.  Doubtful significant contributing factor from radiation proctitis, no evidence of overt recurrent tumor anywhere.  Recommended completing CT enterography.  CT enterography completed 06/13/2015 which found no acute abdominal/pelvic findings, mass lesions, or adenopathy.  Status post partial right hemicolectomy, no findings for recurrent tumor or metastatic disease.  Brachytherapy seeds in the prostate gland and no pelvic or retroperitoneal adenopathy or worrisome bone lesions, no findings for small bowel inflammatory process, mass, obstruction.  Most recent CT abdomen and pelvis completed 06/14/2017 by hematology/oncology.  Findings include right hemicolectomy without recurrent or metastatic disease, coronary artery atherosclerosis and aortic atherosclerosis.  Today he states he's doing well overall. He has had decreasing appetite over the last couple weeks. Stools are dark "almost black" but solid. Is taking iron via MVI. Denies Pepto. Decreased appetite started a couple weeks ago, sudden onset. Also with 2-3 lb weight lost in the past few weeks. Thinks it may be due to high stress due to mother and wife with shingles. Denies hematochezia. Denies abdominal pain, N/V, acute changes in bowel movements. Has a morning bowel movement that is hard/constipated and then a second around 11 am which is soft and passes easily. Denies GERD symptoms. Denies chest pain, dyspnea, dizziness, lightheadedness, syncope, near syncope. Denies any other upper or lower GI symptoms.  Past Medical History:  Diagnosis Date  . Abnormality of gait 12/18/2013  . Anemia    hx of anemia as a child   . Arthritis   . Biliary dyskinesia   .  CAD (coronary artery disease) 2012   a. hx of stent x2 in ramus/OM and mid LAD in 2012  b. balloon angioplaty of OM  .  CLL (chronic lymphocytic leukemia) ([place])   . Colon cancer ([place]) 12/2011   s/p right hemicolectomy, did not tolerate chemo  . Colon polyps    tubular adenomas  . Diverticula of colon 2010  . Exposure to [employer]   . Family history of prostate cancer   . Family history of stomach cancer   . Folic acid deficiency   . Gastroparesis 04/12/2013   borderline  . GERD (gastroesophageal reflux disease)   . Glaucoma   . Heart palpitations 10/2012  . Hiatal hernia 2010   tiny  . HTN (hypertension)   . Iron deficiency 05/02/2012   followed by Dr. Tressie [name], iron infusions.   . Myocardial infarction ([place]) 2012  . Peripheral neuropathy   . Prostate cancer [facility]) 2010   s/p radiation, surgery  . PTSD (post-traumatic stress disorder)   . Radiation Feb 2011   treatment  . Radiation proctitis   . Rosacea     Past Surgical History:  Procedure Laterality Date  . CARDIAC CATHETERIZATION  02/13/2011   [facility], [facility] cardiology  . CATARACT EXTRACTION     2011  . CATARACT EXTRACTION W/PHACO Right 08/06/2013   Procedure: CATARACT EXTRACTION PHACO AND INTRAOCULAR LENS PLACEMENT (IOC);  Surgeon: Tonny Branch, MD;  Location: [facility] ORS;  Service: Ophthalmology;  Laterality: Right;  CDE:20.96  . CHOLECYSTECTOMY    . COLONOSCOPY  02/12/2004   Dr. Gala Romney- L side diverticula, inflammatory  colon polyps  . COLONOSCOPY  01/19/2012   RMR: Prominent changes involving the rectal mucosa consistent with radiation-induced proctitis. Multiple colonic  polyps removed as described above. Sigmoid Diverticulosis/ 1.5 x 2 cm relatively flat ulcerated lesion in cecum/path showed adenocarcinoma of the colon. descending colon polyp with tubular adenoma and one fragment of focal high grade dysplasia.   . COLONOSCOPY  08/28/2012   BTD:[id number] proctitis. Colonic diverticulosis/Ulcerations at the surgical anastomosis  path: ulcerated colonic mucosa with prolapse changes, tubular adenoma.   . COLONOSCOPY N/A 08/02/2013   RMR:  Colonic polyps -removed as described above. Colonic diverticulosis. Status post reight hemicolectomy. Radiation proctitis- asymptomatic.   Marland Kitchen COLONOSCOPY N/A 05/09/2015   Procedure: COLONOSCOPY;  Surgeon: Daneil Dolin, MD;  Location: [facility] SU[facility];  Service: Endoscopy;  Laterality: N/A;  1200-moved to [address]5 [address] to notify pt  . CORONARY ANGIOPLASTY  06/17/14   OM1 POBA  . CORONARY STENT PLACEMENT  01/2011   2.5x57mm Xience drug-eluting stent in ramus intermedius/OMI vessel, 2.5x57mm Promus Element stent in mid LAD artery  . ESOPHAGOGASTRODUODENOSCOPY  10/10/2008   Dr. Tilda Burrow hiatal hernia, normal esophagus, normal stomach  . ESOPHAGOGASTRODUODENOSCOPY  02/12/2004   Dr. Dudley Major- normal   . ESOPHAGOGASTRODUODENOSCOPY N/A 05/09/2015   Procedure: ESOPHAGOGASTRODUODENOSCOPY (EGD);  Surgeon: Daneil Dolin, MD;  Location: [facility];  Service: Endoscopy;  Laterality: N/A;  . GIVENS CAPSULE STUDY N/A 06/03/2015   Procedure: GIVENS CAPSULE STUDY;  Surgeon: Daneil Dolin, MD;  Location: [facility];  Service: Endoscopy;  Laterality: N/A;  0700  . KNEE SURGERY     left knee   . LEFT HEART CATHETERIZATION WITH CORONARY ANGIOGRAM N/A 06/17/2014   Procedure: LEFT HEART CATHETERIZATION WITH CORONARY ANGIOGRAM;  Surgeon: Burnell Blanks, MD;  Location[facility] C[facility] LAB;  Service: Cardiovascular;  Laterality: N/A;  . PARTIAL COLECTOMY  02/29/2012   Procedure: PARTIAL COLECTOMY;  Surgeon: Edsel Petrin  Dalbert Batman, MD;  Location: [facility] ORS;  Service: General;  Laterality: Right;  . PROSTATECTOMY    . TONSILLECTOMY    . TRANSURETHRAL RESECTION OF PROSTATE    . VASECTOMY      Current Outpatient Medications  Medication Sig Dispense Refill  . aspirin EC 81 MG tablet Take 81 mg by mouth daily.    . B Complex Vitamins (VITAMIN B COMPLEX PO) Take 1 tablet by mouth daily.     . cetirizine (ZYRTEC) 10 MG tablet Take 10 mg by mouth daily.    . citalopram (CELEXA) 10 MG tablet Take 20 mg by mouth every morning.     .  clopidogrel (PLAVIX) 75 MG tablet TAKE (1) TABLET BY MOUTH ONCE DAILY. 30 tablet 11  . Cyanocobalamin (RA VITAMIN B-12 TR) 1000 MCG TBCR Take by mouth.    . folic acid (FOLVITE) 1 MG tablet TAKE (1) TABLET BY MOUTH ONCE DAILY . 30 tablet 0  . latanoprost (XALATAN) 0.005 % ophthalmic solution Place 1 drop into both eyes at bedtime.     Marland Kitchen loperamide (IMODIUM) 2 MG capsule Take 2 mg by mouth as needed for diarrhea or loose stools.    Marland Kitchen losartan (COZAAR) 50 MG tablet Take 25 mg by mouth 2 (two) times daily.     . metoprolol (LOPRESSOR) 50 MG tablet Take 50 mg by mouth 2 (two) times daily.     . Multiple Vitamin (MULTIVITAMIN WITH MINERALS) TABS tablet Take 1 tablet by mouth daily.    . multivitamin-iron-minerals-folic acid (CENTRUM) chewable tablet Chew 1 tablet by mouth daily.    Marland Kitchen NITROSTAT 0.4 MG SL tablet PLACE ONE (1) TABLET UNDER TONGUE EVERY 5 MINUTES UP TO (3) DOSES AS NEEDED FOR CHEST PAIN. 25 tablet 1  . ofloxacin (OCUFLOX) 0.3 % ophthalmic solution Take as directed.    . pantoprazole (PROTONIX) 40 MG tablet TAKE 2 TABLETS BY MOUTH ONCE DAILY. (Patient taking differently: takes one tablet twice daily) 60 tablet 6  . pravastatin (PRAVACHOL) 40 MG tablet TAKE 1 TABLET BY MOUTH AT BEDTIME. 30 tablet 9  . Probiotic Product (PROBIOTIC DAILY PO) Take 1 capsule by mouth daily.    . Vitamin D, Ergocalciferol, (DRISDOL) 50000 units CAPS capsule TAKE 1 CAPSULE TWICE A WEEK 8 capsule 0   No current facility-administered medications for this visit.     Allergies as of 08/18/2017 - Review Complete 08/18/2017  Allergen Reaction Noted  . Shellfish allergy Anaphylaxis 01/13/2012  . Sulfa antibiotics Diarrhea and Nausea Only 12/14/2012  . Sulfonamide derivatives Diarrhea and Nausea Only 04/30/2009    Family History  Problem Relation Age of Onset  . Throat cancer Father        dx in his 84s and again in his 36s; pipe and cigar smoker  . Neuropathy Brother   . Prostate cancer Brother 68  .  Melanoma Brother        dx in his 54s  . Cancer Paternal Uncle        smoking related cancer  . Heart attack Paternal Grandfather   . Stomach cancer Cousin 67  . Colon cancer Neg Hx     Social History   Socioeconomic History  . Marital status: Married    Spouse name: None  . Number of children: 3  . 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 Highest education level: None  Social Needs  . Financial resource strain: None  . Food insecurity - worry: None  . Food insecurity - inability: None  .  Transportation needs - medical: None  . Transportation needs - non-medical: None  Occupational History  . 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 Smoking status: Never Smoker  . Smokeless tobacco: Never Used  Substance and Sexual Activity  . Alcohol use: Yes    Comment: 1 cocktail/month  . Drug use: No  . Sexual activity: Yes    Birth control/protection: None  Other Topics Concern  . None  Social History Narrative  . None    Review of Systems: General: Negative for anorexia, fever, chills, fatigue, weakness. Admits decreased appetite. ENT: Negative for hoarseness, difficulty swallowing. CV: Negative for chest pain, angina, palpitations, dyspnea on exertion, peripheral edema.  Respiratory: Negative for dyspnea at rest, dyspnea on exertion, cough, sputum, wheezing.  GI: See history of present illness. Endo: Negative for unusual weight change.  Heme: Negative for bruising or bleeding.   Physical Exam: BP 123/72   Pulse 67   Temp 98.2 F (36.8 C) (Oral)   Ht 5\' 11"  (1.803 m)   Wt 220 lb 6.4 oz (100 kg)   BMI 30.74 kg/m  General:   Alert and oriented. Pleasant and cooperative. Head:  Normocephalic and atraumatic. Eyes:  Without icterus, sclera clear and conjunctiva pink.  Ears:  Normal auditory acuity. Cardiovascular:  S1, S2 present without murmurs appreciated. Extremities without clubbing or edema. Respiratory:  Clear to auscultation bilaterally. No wheezes,  rales, or rhonchi. No distress.  Gastrointestinal:  +BS, soft, non-tender and non-distended. No HSM noted. No guarding or rebound. No masses appreciated.  Rectal:  Deferred  Musculoskalatal:  Symmetrical without gross deformities. Neurologic:  Alert and oriented x4;  grossly normal neurologically. Psych:  Alert and cooperative. Normal mood and affect. Heme/Lymph/Immune: No excessive bruising noted.    08/18/2017 10:54 AM   Disclaimer: This note was dictated with voice recognition software. Similar sounding words can inadvertently be transcribed and may not be corrected upon review.

## 2017-09-01 NOTE — Progress Notes (Signed)
[facility] Note  09/05/2017     CHIEF COMPLAINT Patient presents for Retina Follow Up   HISTORY OF PRESENT ILLNESS: David Pugh is a 82 y.o. male who presents to the clinic today for:   HPI    Retina Follow Up    In both eyes.  Severity is mild.  Duration of 5 years.  Since onset it is stable.  I, the attending physician,  performed the HPI with the patient and updated documentation appropriately.          Comments    F/U PVD OU. Patient states his floaters" has become unnoticeable , he notices  them only when he focuses on them". Pt denies new symptoms. Pt uses Latanoprost one gtt  QHS Pt takes Centrum Qd       Last edited by Bernarda Caffey, MD on 09/05/2017  1:48 PM. (History)      Referring physician: [name]., MD [place], [place] [address]  HISTORICAL INFORMATION:   Selected notes from the MEDICAL RECORD NUMBER Referred by Dr. Zadie Rhine for concern of floater and loss of VA;  LEE- 8.6.18 (G. Rankin) [BCVA OD: 20/25 OS: 200 ECARD @ 64ft] Ocular Hx- pseudophakia OU ([id number] Surgeon: Raliegh Ip. Hunt - in [place]), S/P Yag Cap OD (2.14.17), CRVO OS, AMDOD, CME OS, PVD OU, currently taking latanoprost OU qd; PMH- HTN, arthritis, hx colon ca, high chol    CURRENT MEDICATIONS: Current Outpatient Medications (Ophthalmic Drugs)  Medication Sig  . latanoprost (XALATAN) 0.005 % ophthalmic solution Place 1 drop into both eyes at bedtime.   Marland Kitchen ofloxacin (OCUFLOX) 0.3 % ophthalmic solution Take as directed.   No current facility-administered medications for this visit.  (Ophthalmic Drugs)   Current Outpatient Medications (Other)  Medication Sig  . aspirin EC 81 MG tablet Take 81 mg by mouth daily.  . B Complex Vitamins (VITAMIN B COMPLEX PO) Take 1 tablet by mouth daily.   . cetirizine (ZYRTEC) 10 MG tablet Take 10 mg by mouth daily.  . citalopram (CELEXA) 10 MG tablet Take 20 mg by mouth every morning.   . clopidogrel (PLAVIX) 75  MG tablet TAKE (1) TABLET BY MOUTH ONCE DAILY.  Marland Kitchen Cyanocobalamin (RA VITAMIN B-12 TR) 1000 MCG TBCR Take by mouth.  . folic acid (FOLVITE) 1 MG tablet TAKE (1) TABLET BY MOUTH ONCE DAILY .  Marland Kitchen loperamide (IMODIUM) 2 MG capsule Take 2 mg by mouth as needed for diarrhea or loose stools.  Marland Kitchen losartan (COZAAR) 50 MG tablet Take 25 mg by mouth 2 (two) times daily.   . metoprolol (LOPRESSOR) 50 MG tablet Take 50 mg by mouth 2 (two) times daily.   . Multiple Vitamin (MULTIVITAMIN WITH MINERALS) TABS tablet Take 1 tablet by mouth daily.  . multivitamin-iron-minerals-folic acid (CENTRUM) chewable tablet Chew 1 tablet by mouth daily.  Marland Kitchen NITROSTAT 0.4 MG SL tablet PLACE ONE (1) TABLET UNDER TONGUE EVERY 5 MINUTES UP TO (3) DOSES AS NEEDED FOR CHEST PAIN.  Marland Kitchen pantoprazole (PROTONIX) 40 MG tablet TAKE 2 TABLETS BY MOUTH ONCE DAILY. (Patient taking differently: takes one tablet twice daily)  . pravastatin (PRAVACHOL) 40 MG tablet TAKE 1 TABLET BY MOUTH AT BEDTIME.  . Probiotic Product (PROBIOTIC DAILY PO) Take 1 capsule by mouth daily.  . Vitamin D, Ergocalciferol, (DRISDOL) 50000 units CAPS capsule TAKE 1 CAPSULE TWICE A WEEK   No current facility-administered medications for this visit.  (Other)      REVIEW  OF SYSTEMS: ROS    Positive for: Gastrointestinal, Genitourinary, Musculoskeletal, Cardiovascular, Eyes   Negative for: Constitutional, Neurological, Skin, HENT, Endocrine, Respiratory, Psychiatric, Allergic/Imm, Heme/Lymph   Last edited by Zenovia Jordan, LPN on 04/02/6658  9:35 PM. (History)       ALLERGIES Allergies  Allergen Reactions  . Shellfish Allergy Anaphylaxis  . Sulfa Antibiotics Diarrhea and Nausea Only  . Sulfonamide Derivatives Diarrhea and Nausea Only    PAST MEDICAL HISTORY Past Medical History:  Diagnosis Date  . Abnormality of gait 12/18/2013  . Anemia    hx of anemia as a child   . Arthritis   . Biliary dyskinesia   . CAD (coronary artery disease) 2012   a. hx of  stent x2 in ramus/OM and mid LAD in 2012  b. balloon angioplaty of OM  . CLL (chronic lymphocytic leukemia) ([place])   . Colon cancer ([place]) 12/2011   s/p right hemicolectomy, did not tolerate chemo  . Colon polyps    tubular adenomas  . Diverticula of colon 2010  . Exposure to [employer]   . Family history of prostate cancer   . Family history of stomach cancer   . Folic acid deficiency   . Gastroparesis 04/12/2013   borderline  . GERD (gastroesophageal reflux disease)   . Glaucoma   . Heart palpitations 10/2012  . Hiatal hernia 2010   tiny  . HTN (hypertension)   . Iron deficiency 05/02/2012   followed by Dr. Tressie [name], iron infusions.   . Myocardial infarction ([place]) 2012  . Peripheral neuropathy   . Prostate cancer [facility]) 2010   s/p radiation, surgery  . PTSD (post-traumatic stress disorder)   . Radiation Feb 2011   treatment  . Radiation proctitis   . Rosacea    Past Surgical History:  Procedure Laterality Date  . CARDIAC CATHETERIZATION  02/13/2011   [facility], [facility] cardiology  . CATARACT EXTRACTION     2011  . CATARACT EXTRACTION W/PHACO Right 08/06/2013   Procedure: CATARACT EXTRACTION PHACO AND INTRAOCULAR LENS PLACEMENT (IOC);  Surgeon: Tonny Branch, MD;  Location: [facility] ORS;  Service: Ophthalmology;  Laterality: Right;  CDE:20.96  . CHOLECYSTECTOMY    . COLONOSCOPY  02/12/2004   Dr. Gala Romney- L side diverticula, inflammatory  colon polyps  . COLONOSCOPY  01/19/2012   RMR: Prominent changes involving the rectal mucosa consistent with radiation-induced proctitis. Multiple colonic  polyps removed as described above. Sigmoid Diverticulosis/ 1.5 x 2 cm relatively flat ulcerated lesion in cecum/path showed adenocarcinoma of the colon. descending colon polyp with tubular adenoma and one fragment of focal high grade dysplasia.   . COLONOSCOPY  08/28/2012   TSV:[id number] proctitis. Colonic diverticulosis/Ulcerations at the surgical anastomosis  path: ulcerated colonic mucosa with  prolapse changes, tubular adenoma.   . COLONOSCOPY N/A 08/02/2013   RMR: Colonic polyps -removed as described above. Colonic diverticulosis. Status post reight hemicolectomy. Radiation proctitis- asymptomatic.   Marland Kitchen COLONOSCOPY N/A 05/09/2015   Procedure: COLONOSCOPY;  Surgeon: Daneil Dolin, MD;  Location: [facility] ENDO SUITE;  Service: Endoscopy;  Laterality: N/A;  1200-moved to [address]5 [place] to notify pt  . CORONARY ANGIOPLASTY  06/17/14   OM1 POBA  . CORONARY STENT PLACEMENT  01/2011   2.5x109mm Xience drug-eluting stent in ramus intermedius/OMI vessel, 2.5x75mm Promus Element stent in mid LAD artery  . ESOPHAGOGASTRODUODENOSCOPY  10/10/2008   Dr. Tilda Burrow hiatal hernia, normal esophagus, normal stomach  . ESOPHAGOGASTRODUODENOSCOPY  02/12/2004   Dr. Dudley Major- normal   . ESOPHAGOGASTRODUODENOSCOPY N/A 05/09/2015   Procedure:  ESOPHAGOGASTRODUODENOSCOPY (EGD);  Surgeon: Daneil Dolin, MD;  Location: [facility];  Service: Endoscopy;  Laterality: N/A;  . GIVENS CAPSULE STUDY N/A 06/03/2015   Procedure: GIVENS CAPSULE STUDY;  Surgeon: Daneil Dolin, MD;  Location: [facility] END[facility] SU[facility];  Service: Endoscopy;  Laterality: N/A;  0700  . KNEE SURGERY     left knee   . LEFT HEART CATHETERIZATION WITH CORONARY ANGIOGRAM N/A 06/17/2014   Procedure: LEFT HEART CATHETERIZATION WITH CORONARY ANGIOGRAM;  Surgeon: Burnell Blanks, MD;  Location: [facility] CATH LAB;  Service: Cardiovascular;  Laterality: N/A;  . PARTIAL COLECTOMY  02/29/2012   Procedure: PARTIAL COLECTOMY;  Surgeon: Adin Hector, MD;  Location: [facility] ORS;  Service: General;  Laterality: Right;  . PROSTATECTOMY    . TONSILLECTOMY    . TRANSURETHRAL RESECTION OF PROSTATE    . VASECTOMY      FAMILY HISTORY Family History  Problem Relation Age of Onset  . Throat cancer Father        dx in his 53s and again in his 48s; pipe and cigar smoker  . Neuropathy Brother   . Prostate cancer Brother 10  . Melanoma Brother        dx in his 74s  . Cancer Paternal  Uncle        smoking related cancer  . Heart attack Paternal Grandfather   . Stomach cancer Cousin 58  . Colon cancer Neg Hx     SOCIAL HISTORY Social History   Tobacco Use  . Smoking status: Never Smoker  . Smokeless tobacco: Never Used  Substance Use Topics  . Alcohol use: Yes    Comment: 1 cocktail/month  . Drug use: No         OPHTHALMIC EXAM:  Base Eye Exam    Visual Acuity (Snellen - Linear)      Right Left   Dist cc 20/25 CF at 3'   Dist ph cc 20/20 -2 20/400   Correction:  Glasses       Tonometry (Tonopen, 1:16 PM)      Right Left   Pressure 14 14       Pupils      Dark Light Shape React APD   Right 3 2 Round Minimal None   Left 3 2 Round Sluggish +2       Visual Fields (Counting fingers)      Left Right    Full Full       Extraocular Movement      Right Left    Full, Ortho Full, Ortho       Neuro/Psych    Oriented x3:  Yes   Mood/Affect:  Normal       Dilation    Both eyes:  1.0% Mydriacyl, 2.5% Phenylephrine @ 1:16 PM        Slit Lamp and Fundus Exam    Slit Lamp Exam      Right Left   Lids/Lashes Dermatochalasis - upper lid Dermatochalasis - upper lid   Conjunctiva/Sclera White and quiet White and quiet   Cornea 1+ Punctate epithelial erosions, Arcus 1+ Punctate epithelial erosions, Arcus   Anterior Chamber Deep and quiet Deep and quiet   Iris Round and dilated Round and dilated   Lens Posterior chamber intraocular lens in good position, open PC Posterior chamber intraocular lens in good position, open PC   Vitreous Prominent Posterior vitreous detachment displaced nasally, prominent Weiss ring Posterior vitreous detachment       Fundus Exam  Right Left   Disc Normal Normal, positive collateral vessles   C/D Ratio 0.4 0.4   Macula Flat, trace Retinal pigment epithelial mottling Flat, central atrophy, Microaneurysms, pigment clumping, cystic changes   Vessels Mild Vascular attenuation Vascular attenuation with dilated  veins, positive collateral vessels on disc   Periphery Attached, mild Reticular degeneration attached, 360 PRP laser        Refraction    Wearing Rx      Sphere Cylinder Axis Add   Right -0.50 +0.75 153 +2.25   Left +0.00             IMAGING AND PROCEDURES  Imaging and Procedures for 09/05/17  OCT, Retina - OU - Both Eyes     Right Eye Quality was good. Central Foveal Thickness: 288. Progression has been stable. Findings include normal foveal contour, no SRF, no IRF, epiretinal membrane (Trace ERM).   Left Eye Quality was good. Central Foveal Thickness: 244. Progression has been stable. Findings include abnormal foveal contour, intraretinal fluid, outer retinal atrophy, subretinal hyper-reflective material, no SRF, epiretinal membrane.   Notes Images taken, stored on drive  Diagnosis / Impression:  OD: trace ERM, NFP, No IRF, No SRF OS: CRVO with CME and outer retinal atrophy, trace ERM  Clinical management:  See below  Abbreviations: NFP - Normal foveal profile. CME - cystoid macular edema. PED - pigment epithelial detachment. IRF - intraretinal fluid. SRF - subretinal fluid. EZ - ellipsoid zone. ERM - epiretinal membrane. ORA - outer retinal atrophy. ORT - outer retinal tubulation. SRHM - subretinal hyper-reflective material                  ASSESSMENT/PLAN:    ICD-10-CM   1. Posterior vitreous detachment of both eyes H43.813 OCT, Retina - OU - Both Eyes  2. Central retinal vein occlusion with macular edema of left eye H34.8120 OCT, Retina - OU - Both Eyes  3. Hypertensive retinopathy of both eyes H35.033   4. Epiretinal membrane (ERM) of both eyes H35.373 OCT, Retina - OU - Both Eyes  5. Pseudophakia of both eyes Z96.1    1. PVD / vitreous syneresis OU  Acute onset PVD OD  Discussed findings and prognosis  No RT or RD on repeat 360 peripheral exam  Reviewed s/s of RT/RD  Strict return precautions for any such RT/RD signs/symptoms  F/U 3 months  2.  History of CRVO OS with CME, atrophy and low vision  Onset years ago  Per pt, received injections for 1-2 years with minimal benefit w/ last injection being in 2017 by Dr. Zadie Rhine  S/p PRP OS w/ Dr. Zadie Rhine  Residual IRF present and stable  vision mostly limited by central/foveal atrophy  Discussed findings and prognosis  stable  Do not recommend treatment at this time  Recheck in 3 mos  3. Hypertensive retinopathy OU  discussed importance of tight BP control  Monitor  4. Epiretinal membrane, both eyes  The natural history, anatomy, potential for loss of vision, and treatment options including vitrectomy techniques and the complications of endophthalmitis, retinal detachment, vitreous hemorrhage, cataract progression and permanent vision loss discussed with the patient. - mild ERMs with minimal visual sigificance at this time - no indication for surgery at this time - monitor  5. Pseudophakia OU  - s/p CE/IOL  - beautiful surgery, doing well  - monitor    Ophthalmic Meds Ordered this visit:  No orders of the defined types were placed in this encounter.  Return in about 3 months (around 12/04/2017) for F/U PVD OU.  There are no Patient Instructions on file for this visit.   Explained the diagnoses, plan, and follow up with the patient and they expressed understanding.  Patient expressed understanding of the importance of proper follow up care.   This document serves as a record of services personally performed by Gardiner Sleeper, MD, PhD. It was created on their behalf by Catha Brow, [place], a certified ophthalmic assistant. The creation of this record is the provider's dictation and/or activities during the visit.  Electronically signed by: Catha Brow, [place]  09/05/17 2:03 PM    Gardiner Sleeper, M.D., Ph.D. Diseases & Surgery of the Retina and [place] 09/05/17   I have reviewed the above documentation for accuracy and  completeness, and I agree with the above. Gardiner Sleeper, M.D., Ph.D. 09/05/17 2:03 PM     Abbreviations: M myopia (nearsighted); A astigmatism; H hyperopia (farsighted); P presbyopia; Mrx spectacle prescription;  CTL contact lenses; OD right eye; OS left eye; OU both eyes  XT exotropia; ET esotropia; PEK punctate epithelial keratitis; PEE punctate epithelial erosions; DES dry eye syndrome; MGD meibomian gland dysfunction; ATs artificial tears; PFAT's preservative free artificial tears; [place] nuclear sclerotic cataract; PSC posterior subcapsular cataract; ERM epi-retinal membrane; PVD posterior vitreous detachment; RD retinal detachment; DM diabetes mellitus; DR diabetic retinopathy; NPDR non-proliferative diabetic retinopathy; PDR proliferative diabetic retinopathy; CSME clinically significant macular edema; DME diabetic macular edema; dbh dot blot hemorrhages; CWS cotton wool spot; POAG primary open angle glaucoma; C/D cup-to-disc ratio; HVF humphrey visual field; GVF goldmann visual field; OCT optical coherence tomography; IOP intraocular pressure; BRVO Branch retinal vein occlusion; CRVO central retinal vein occlusion; CRAO central retinal artery occlusion; BRAO branch retinal artery occlusion; RT retinal tear; SB scleral buckle; PPV pars plana vitrectomy; VH Vitreous hemorrhage; PRP panretinal laser photocoagulation; IVK intravitreal kenalog; VMT vitreomacular traction; MH Macular hole;  NVD neovascularization of the disc; NVE neovascularization elsewhere; AREDS age related eye disease study; ARMD age related macular degeneration; POAG primary open angle glaucoma; EBMD epithelial/anterior basement membrane dystrophy; ACIOL anterior chamber intraocular lens; IOL intraocular lens; PCIOL posterior chamber intraocular lens; Phaco/IOL phacoemulsification with intraocular lens placement; [place] photorefractive keratectomy; LASIK laser assisted in situ keratomileusis; HTN hypertension; DM diabetes mellitus; COPD  chronic obstructive pulmonary disease

## 2017-09-05 ENCOUNTER — Encounter (INDEPENDENT_AMBULATORY_CARE_PROVIDER_SITE_OTHER): Payer: Self-pay | Admitting: Ophthalmology

## 2017-09-05 ENCOUNTER — Ambulatory Visit (INDEPENDENT_AMBULATORY_CARE_PROVIDER_SITE_OTHER): Payer: Medicare Other | Admitting: Ophthalmology

## 2017-09-05 DIAGNOSIS — H43813 Vitreous degeneration, bilateral: Secondary | ICD-10-CM

## 2017-09-05 DIAGNOSIS — H34812 Central retinal vein occlusion, left eye, with macular edema: Secondary | ICD-10-CM

## 2017-09-05 DIAGNOSIS — H35033 Hypertensive retinopathy, bilateral: Secondary | ICD-10-CM | POA: Diagnosis not present

## 2017-09-05 DIAGNOSIS — Z961 Presence of intraocular lens: Secondary | ICD-10-CM

## 2017-09-05 DIAGNOSIS — H35373 Puckering of macula, bilateral: Secondary | ICD-10-CM

## 2017-09-13 ENCOUNTER — Ambulatory Visit (INDEPENDENT_AMBULATORY_CARE_PROVIDER_SITE_OTHER): Payer: Medicare Other | Admitting: Nurse Practitioner

## 2017-09-13 ENCOUNTER — Other Ambulatory Visit: Payer: Self-pay | Admitting: Cardiovascular Disease

## 2017-09-13 DIAGNOSIS — R195 Other fecal abnormalities: Secondary | ICD-10-CM | POA: Diagnosis not present

## 2017-09-13 LAB — IFOBT (OCCULT BLOOD): IFOBT: NEGATIVE

## 2017-09-20 ENCOUNTER — Other Ambulatory Visit: Payer: Self-pay

## 2017-09-20 DIAGNOSIS — R634 Abnormal weight loss: Secondary | ICD-10-CM

## 2017-09-20 DIAGNOSIS — C189 Malignant neoplasm of colon, unspecified: Secondary | ICD-10-CM

## 2017-10-03 ENCOUNTER — Other Ambulatory Visit (HOSPITAL_COMMUNITY)
Admission: RE | Admit: 2017-10-03 | Discharge: 2017-10-03 | Disposition: A | Discharge status: Home / Self Care | Payer: Medicare Other | Source: Ambulatory Visit | Attending: Internal Medicine | Admitting: Internal Medicine

## 2017-10-03 DIAGNOSIS — R634 Abnormal weight loss: Secondary | ICD-10-CM | POA: Insufficient documentation

## 2017-10-03 DIAGNOSIS — C189 Malignant neoplasm of colon, unspecified: Secondary | ICD-10-CM | POA: Diagnosis present

## 2017-10-03 LAB — CBC WITH DIFFERENTIAL/PLATELET
BASOS ABS: 0 10*3/uL (ref 0.0–0.1)
Basophils Relative: 0 %
Eosinophils Absolute: 1.3 10*3/uL — ABNORMAL HIGH (ref 0.0–0.7)
Eosinophils Relative: 8 %
HEMATOCRIT: 44 % (ref 39.0–52.0)
Hemoglobin: 14.3 g/dL (ref 13.0–17.0)
LYMPHS ABS: 6.5 10*3/uL (ref 0.7–4.0)
LYMPHS PCT: 39 %
MCH: 30.9 pg (ref 26.0–34.0)
MCHC: 32.5 g/dL (ref 30.0–36.0)
MCV: 95 fL (ref 78.0–100.0)
MONO ABS: 1.1 10*3/uL (ref 0.1–1.0)
Monocytes Relative: 7 %
NEUTROS ABS: 7.5 10*3/uL (ref 1.7–7.7)
Neutrophils Relative %: 46 %
Platelets: 394 10*3/uL (ref 150–400)
RBC: 4.63 MIL/uL (ref 4.22–5.81)
RDW: 12.9 % (ref 11.5–15.5)
WBC: 16.4 10*3/uL — ABNORMAL HIGH (ref 4.0–10.5)

## 2017-10-04 ENCOUNTER — Ambulatory Visit (HOSPITAL_COMMUNITY): Payer: Medicare Other

## 2017-10-04 ENCOUNTER — Other Ambulatory Visit (HOSPITAL_COMMUNITY): Payer: Medicare Other

## 2017-10-11 ENCOUNTER — Encounter: Payer: Self-pay | Admitting: *Deleted

## 2017-10-11 ENCOUNTER — Other Ambulatory Visit: Payer: Self-pay | Admitting: *Deleted

## 2017-10-11 ENCOUNTER — Telehealth: Payer: Self-pay | Admitting: *Deleted

## 2017-10-11 ENCOUNTER — Encounter: Payer: Self-pay | Admitting: Internal Medicine

## 2017-10-11 ENCOUNTER — Ambulatory Visit (INDEPENDENT_AMBULATORY_CARE_PROVIDER_SITE_OTHER): Payer: Medicare Other | Admitting: Internal Medicine

## 2017-10-11 VITALS — BP 137/72 | HR 75 | Temp 96.7°F | Ht 71.0 in | Wt 224.4 lb

## 2017-10-11 DIAGNOSIS — K921 Melena: Secondary | ICD-10-CM

## 2017-10-11 DIAGNOSIS — Z85038 Personal history of other malignant neoplasm of large intestine: Secondary | ICD-10-CM | POA: Diagnosis not present

## 2017-10-11 MED ORDER — PEG 3350-KCL-NA BICARB-NACL 420 G PO SOLR: 4000.0000 mL | Freq: Once | ORAL | 0 refills | Status: AC

## 2017-10-11 NOTE — Patient Instructions (Signed)
Will schedule a diagnostic EGD and colonoscopy (propofol) melena and hematochezia ; hx of colon cancer  Continue Protonix 40 mg daily  Continue Plavix every day  Further recommendations to follow

## 2017-10-11 NOTE — Progress Notes (Signed)
Primary Care Physician:  [name]., MD Primary Gastroenterologist:  Dr. Gala Romney  Pre-Procedure History & Physical: HPI:  David Pugh is a 83 y.o. male here for further evaluation of a recent episode of self-limiting painless rectal bleeding. Patient states he passed  fresh blood clot for couple of days about 2 weeks ago. It subsided spontaneously. H&H 8 days ago 14.3 and 44.0. White count elevated at 16.4 (history of CLL).  He has had intermittently dark stools as well  -  Going back a couple of months. He has been taking oral iron intermittently. Chronic history of iron deficiency anemia;  history of cecal colon cancer -  status post right hemicolectomy previously with adenomatous polyps only found on 2016 colonoscopy.   EGD at that time demonstrated known H. Pylori gastritis. Capsule study also demonstrated no significant findings. History of known diverticulosis and radiation proctitis.  Patient takes Plavix chronically.  He is due for surveillance colonoscopy this year;  reflux symptoms well controlled on Protonix 40 mg once daily.  He denies dysphagia but has some early satiety symptoms  He also has some  complaints of irritated hemorrhoids;  they get in the way of wiping after a bowel movement. Patient denies constipation or diarrhea but will have an occasional loose stool in the morning and has to urgently make it to the bathroom.  Otherwise, bowel function is good. He has these symptoms about 2 times monthly.  His weight has been stable.  Past Medical History:  Diagnosis Date  . Abnormality of gait 12/18/2013  . Anemia    hx of anemia as a child   . Arthritis   . Biliary dyskinesia   . CAD (coronary artery disease) 2012   a. hx of stent x2 in ramus/OM and mid LAD in 2012  b. balloon angioplaty of OM  . CLL (chronic lymphocytic leukemia) ([place])   . Colon cancer ([place]) 12/2011   s/p right hemicolectomy, did not tolerate chemo  . Colon polyps    tubular adenomas  .  Diverticula of colon 2010  . Exposure to [employer]   . Family history of prostate cancer   . Family history of stomach cancer   . Folic acid deficiency   . Gastroparesis 04/12/2013   borderline  . GERD (gastroesophageal reflux disease)   . Glaucoma   . Heart palpitations 10/2012  . Hiatal hernia 2010   tiny  . HTN (hypertension)   . Iron deficiency 05/02/2012   followed by Dr. Tressie [name], iron infusions.   . Myocardial infarction ([place]) 2012  . Peripheral neuropathy   . Prostate cancer [facility]) 2010   s/p radiation, surgery  . PTSD (post-traumatic stress disorder)   . Radiation Feb 2011   treatment  . Radiation proctitis   . Rosacea     Past Surgical History:  Procedure Laterality Date  . CARDIAC CATHETERIZATION  02/13/2011   [facility], [facility] cardiology  . CATARACT EXTRACTION     2011  . CATARACT EXTRACTION W/PHACO Right 08/06/2013   Procedure: CATARACT EXTRACTION PHACO AND INTRAOCULAR LENS PLACEMENT (IOC);  Surgeon: Tonny Branch, MD;  Location: [facility] ORS;  Service: Ophthalmology;  Laterality: Right;  CDE:20.96  . CHOLECYSTECTOMY    . COLONOSCOPY  02/12/2004   Dr. Gala Romney- L side diverticula, inflammatory  colon polyps  . COLONOSCOPY  01/19/2012   RMR: Prominent changes involving the rectal mucosa consistent with radiation-induced proctitis. Multiple colonic  polyps removed as described above. Sigmoid Diverticulosis/ 1.5 x 2 cm relatively  flat ulcerated lesion in cecum/path showed adenocarcinoma of the colon. descending colon polyp with tubular adenoma and one fragment of focal high grade dysplasia.   . COLONOSCOPY  08/28/2012   NID:[id number] proctitis. Colonic diverticulosis/Ulcerations at the surgical anastomosis  path: ulcerated colonic mucosa with prolapse changes, tubular adenoma.   . COLONOSCOPY N/A 08/02/2013   RMR: Colonic polyps -removed as described above. Colonic diverticulosis. Status post reight hemicolectomy. Radiation proctitis- asymptomatic.   Marland Kitchen COLONOSCOPY N/A 05/09/2015    Procedure: COLONOSCOPY;  Surgeon: Daneil Dolin, MD;  Location: [facility];  Service: Endoscopy;  Laterality: N/A;  1200-moved to [address] to notify pt  . CORONARY ANGIOPLASTY  06/17/14   OM1 POBA  . CORONARY STENT PLACEMENT  01/2011   2.5x80mm Xience drug-eluting stent in ramus intermedius/OMI vessel, 2.5x30mm Promus Element stent in mid LAD artery  . ESOPHAGOGASTRODUODENOSCOPY  10/10/2008   Dr. Tilda Burrow hiatal hernia, normal esophagus, normal stomach  . ESOPHAGOGASTRODUODENOSCOPY  02/12/2004   Dr. Dudley Major- normal   . ESOPHAGOGASTRODUODENOSCOPY N/A 05/09/2015   Procedure: ESOPHAGOGASTRODUODENOSCOPY (EGD);  Surgeon: Daneil Dolin, MD;  [facility];  Service: Endoscopy;  Laterality: N/A;  . GIVENS CAPSULE STUDY N/A 06/03/2015   Procedure: GIVENS CAPSULE STUDY;  Surgeon: Daneil Dolin, MD;  Location: [facility];  Service: Endoscopy;  Laterality: N/A;  0700  . KNEE SURGERY     left knee   . LEFT HEART CATHETERIZATION WITH CORONARY ANGIOGRAM N/A 06/17/2014   Procedure: LEFT HEART CATHETERIZATION WITH CORONARY ANGIOGRAM;  Surgeon: Burnell Blanks, MD;  Location: [facility] CATH LAB;  Service: Cardiovascular;  Laterality: N/A;  . PARTIAL COLECTOMY  02/29/2012   Procedure: PARTIAL COLECTOMY;  Surgeon: Adin Hector, MD;  Location: [facility];  Service: General;  Laterality: Right;  . PROSTATECTOMY    . TONSILLECTOMY    . TRANSURETHRAL RESECTION OF PROSTATE    . VASECTOMY      Prior to Admission medications   Medication Sig Start Date End Date Taking? Authorizing Provider  aspirin EC 81 MG tablet Take 81 mg by mouth daily.   Yes [provider]  B Complex Vitamins (VITAMIN B COMPLEX PO) Take 1 tablet by mouth daily.    Yes [provider]  cetirizine (ZYRTEC) 10 MG tablet Take 10 mg by mouth daily.   Yes [provider]  Cholecalciferol (VITAMIN D3) 2000 units TABS Take by mouth daily.   Yes [provider]  citalopram (CELEXA) 10 MG tablet Take  10 mg by mouth every morning.    Yes [provider]  clopidogrel (PLAVIX) 75 MG tablet TAKE (1) TABLET BY MOUTH ONCE DAILY. 05/03/17  Yes Croitoru, Mihai, MD  Cyanocobalamin (RA VITAMIN B-12 TR) 1000 MCG TBCR Take by mouth daily.    Yes [provider]  folic acid (FOLVITE) 1 MG tablet TAKE (1) TABLET BY MOUTH ONCE DAILY . 07/13/17  Yes Holley Bouche, NP  latanoprost (XALATAN) 0.005 % ophthalmic solution Place 1 drop into both eyes at bedtime.    Yes [provider]  loperamide (IMODIUM) 2 MG capsule Take 2 mg by mouth as needed for diarrhea or loose stools.   Yes [provider]  losartan (COZAAR) 50 MG tablet Take 25 mg by mouth 2 (two) times daily.    Yes [provider]  metoprolol (LOPRESSOR) 50 MG tablet Take 50 mg by mouth 2 (two) times daily.    Yes [provider]  multivitamin-iron-minerals-folic acid (CENTRUM) chewable tablet Chew 1 tablet  by mouth daily.   Yes [provider]  NITROSTAT 0.4 MG SL tablet PLACE ONE (1) TABLET UNDER TONGUE EVERY 5 MINUTES UP TO (3) DOSES AS NEEDED FOR CHEST PAIN. 09/22/14  Yes Croitoru, Mihai, MD  ofloxacin (OCUFLOX) 0.3 % ophthalmic solution Take as directed.   Yes [provider]  pantoprazole (PROTONIX) 40 MG tablet TAKE 2 TABLETS BY MOUTH ONCE DAILY. Patient taking differently: Takes one tablet daily 09/19/14  Yes Croitoru, Mihai, MD  pravastatin (PRAVACHOL) 40 MG tablet TAKE 1 TABLET BY MOUTH AT BEDTIME. 09/13/17  Yes Croitoru, Mihai, MD  Probiotic Product (PROBIOTIC DAILY PO) Take 1 capsule by mouth daily.   Yes [provider]  Multiple Vitamin (MULTIVITAMIN WITH MINERALS) TABS tablet Take 1 tablet by mouth daily.    [provider]  polyethylene glycol-electrolytes (NULYTELY/GOLYTELY) 420 g solution Take 4,000 mLs by mouth once for 1 dose. 10/11/17 10/11/17  Daneil Dolin, MD  Vitamin D, Ergocalciferol, (DRISDOL) 50000 units CAPS capsule TAKE 1 CAPSULE TWICE A  WEEK Patient not taking: Reported on 10/11/2017 05/03/17   Holley Bouche, NP    Allergies as of 10/11/2017 - Review Complete 10/11/2017  Allergen Reaction Noted  . Shellfish allergy Anaphylaxis 01/13/2012  . Sulfa antibiotics Diarrhea and Nausea Only 12/14/2012  . Sulfonamide derivatives Diarrhea and Nausea Only 04/30/2009    Family History  Problem Relation Age of Onset  . Throat cancer Father        dx in his 78s and again in his 16s; pipe and cigar smoker  . Neuropathy Brother   . Prostate cancer Brother 67  . Melanoma Brother        dx in his 28s  . Cancer Paternal Uncle        smoking related cancer  . Heart attack Paternal Grandfather   . Stomach cancer Cousin 91  . Colon cancer Neg Hx     Social History   Socioeconomic History  . Marital status: Married    Spouse name: Not on file  . Number of children: 3  . 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 Highest education level: Not on file  Social Needs  . Financial resource strain: Not on file  . Food insecurity - worry: Not on file  . Food insecurity - inability: Not on file  . Transportation needs - medical: Not on file  . Transportation needs - non-medical: Not on file  Occupational History  . 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 Smoking status: Never Smoker  . Smokeless tobacco: Never Used  Substance and Sexual Activity  . Alcohol use: Yes    Comment: 1 cocktail/month  . Drug use: No  . Sexual activity: Yes    Birth control/protection: None  Other Topics Concern  . Not on file  Social History Narrative  . Not on file    Review of Systems: See HPI, otherwise negative ROS  Physical Exam: BP 137/72   Pulse 75   Temp (!) 96.7 F (35.9 C) (Oral)   Ht 5\' 11"  (1.803 m)   Wt 224 lb 6.4 oz (101.8 kg)   BMI 31.30 kg/m  General:   Alert,  pleasant and cooperative in NAD Neck:  Supple; no masses or thyromegaly. No significant cervical adenopathy. Lungs:  Clear throughout to auscultation.    No wheezes, crackles, or rhonchi. No acute distress. Heart:  Regular rate and rhythm; no murmurs, clicks, rubs,  or gallops. Abdomen: Non-distended, normal bowel sounds.  Soft and nontender without appreciable mass or hepatosplenomegaly.  Pulses:  Normal pulses noted. Extremities:  Without clubbing or edema.  Impression: Very pleasant 83 year old gentleman with long-standing iron deficiency anemia. History of ascending colon cancer  - status post resection with subsequent adenomatous polyps. Non- H. Pylori gastritis and negative capsule study previously. Recent intermittent dark stools on iron and self-limiting hematochezia.  Hx of CLL;  Recent H and H good.  He is on Plavix. History of radiation proctitis and known diverticulosis. No significant drop in hemoglobin;  Vague symptoms of early satiety.  History of CLL, prostate and colon cancer.  Recommendations:  I have offered the patient both a diagnostic colonoscopy and EGD.  The risks, benefits, limitations, imponderables and alternatives regarding both EGD and colonoscopy have been reviewed with the patient. Questions have been answered. All parties agreeable.   Patient is to continue Protonix 40 mg daily for now.  Further recommendations to follow.     Notice: This dictation was prepared with Dragon dictation along with smaller phrase technology. Any transcriptional errors that result from this process are unintentional and may not be corrected upon review.

## 2017-10-11 NOTE — Telephone Encounter (Signed)
Spoke with pt and is aware pre-op scheduled for 11/03/17 at 9:00am. Letter mailed.

## 2017-10-11 NOTE — H&P (View-Only) (Signed)
Primary Care Physician:  [name]., MD Primary Gastroenterologist:  Dr. Gala Romney  Pre-Procedure History & Physical: HPI:  David Pugh is a 83 y.o. male here for further evaluation of a recent episode of self-limiting painless rectal bleeding. Patient states he passed  fresh blood clot for couple of days about 2 weeks ago. It subsided spontaneously. H&H 8 days ago 14.3 and 44.0. White count elevated at 16.4 (history of CLL).  He has had intermittently dark stools as well  -  Going back a couple of months. He has been taking oral iron intermittently. Chronic history of iron deficiency anemia;  history of cecal colon cancer -  status post right hemicolectomy previously with adenomatous polyps only found on 2016 colonoscopy.   EGD at that time demonstrated known H. Pylori gastritis. Capsule study also demonstrated no significant findings. History of known diverticulosis and radiation proctitis.  Patient takes Plavix chronically.  He is due for surveillance colonoscopy this year;  reflux symptoms well controlled on Protonix 40 mg once daily.  He denies dysphagia but has some early satiety symptoms  He also has some  complaints of irritated hemorrhoids;  they get in the way of wiping after a bowel movement. Patient denies constipation or diarrhea but will have an occasional loose stool in the morning and has to urgently make it to the bathroom.  Otherwise, bowel function is good. He has these symptoms about 2 times monthly.  His weight has been stable.  Past Medical History:  Diagnosis Date  . Abnormality of gait 12/18/2013  . Anemia    hx of anemia as a child   . Arthritis   . Biliary dyskinesia   . CAD (coronary artery disease) 2012   a. hx of stent x2 in ramus/OM and mid LAD in 2012  b. balloon angioplaty of OM  . CLL (chronic lymphocytic leukemia) ([place])   . Colon cancer ([place]) 12/2011   s/p right hemicolectomy, did not tolerate chemo  . Colon polyps    tubular adenomas  .  Diverticula of colon 2010  . Exposure to [employer]   . Family history of prostate cancer   . Family history of stomach cancer   . Folic acid deficiency   . Gastroparesis 04/12/2013   borderline  . GERD (gastroesophageal reflux disease)   . Glaucoma   . Heart palpitations 10/2012  . Hiatal hernia 2010   tiny  . HTN (hypertension)   . Iron deficiency 05/02/2012   followed by Dr. Tressie [name], iron infusions.   . Myocardial infarction ([place]) 2012  . Peripheral neuropathy   . Prostate cancer [facility]) 2010   s/p radiation, surgery  . PTSD (post-traumatic stress disorder)   . Radiation Feb 2011   treatment  . Radiation proctitis   . Rosacea     Past Surgical History:  Procedure Laterality Date  . CARDIAC CATHETERIZATION  02/13/2011   [facility], [facility] cardiology  . CATARACT EXTRACTION     2011  . CATARACT EXTRACTION W/PHACO Right 08/06/2013   Procedure: CATARACT EXTRACTION PHACO AND INTRAOCULAR LENS PLACEMENT (IOC);  Surgeon: Tonny Branch, MD;  Location: [facility];  Service: Ophthalmology;  Laterality: Right;  CDE:20.96  . CHOLECYSTECTOMY    . COLONOSCOPY  02/12/2004   Dr. Gala Romney- L side diverticula, inflammatory  colon polyps  . COLONOSCOPY  01/19/2012   RMR: Prominent changes involving the rectal mucosa consistent with radiation-induced proctitis. Multiple colonic  polyps removed as described above. Sigmoid Diverticulosis/ 1.5 x 2 cm relatively  flat ulcerated lesion in cecum/path showed adenocarcinoma of the colon. descending colon polyp with tubular adenoma and one fragment of focal high grade dysplasia.   . COLONOSCOPY  08/28/2012   DTO:[id number] proctitis. Colonic diverticulosis/Ulcerations at the surgical anastomosis  path: ulcerated colonic mucosa with prolapse changes, tubular adenoma.   . COLONOSCOPY N/A 08/02/2013   RMR: Colonic polyps -removed as described above. Colonic diverticulosis. Status post reight hemicolectomy. Radiation proctitis- asymptomatic.   Marland Kitchen COLONOSCOPY N/A 05/09/2015    Procedure: COLONOSCOPY;  Surgeon: Daneil Dolin, MD;  [facility];  Service: Endoscopy;  Laterality: N/A;  1200-moved to [address] to notify pt  . CORONARY ANGIOPLASTY  06/17/14   OM1 POBA  . CORONARY STENT PLACEMENT  01/2011   2.5x25mm Xience drug-eluting stent in ramus intermedius/OMI vessel, 2.5x39mm Promus Element stent in mid LAD artery  . ESOPHAGOGASTRODUODENOSCOPY  10/10/2008   Dr. Tilda Burrow hiatal hernia, normal esophagus, normal stomach  . ESOPHAGOGASTRODUODENOSCOPY  02/12/2004   Dr. Dudley Major- normal   . ESOPHAGOGASTRODUODENOSCOPY N/A 05/09/2015   Procedure: ESOPHAGOGASTRODUODENOSCOPY (EGD);  Surgeon: Daneil Dolin, MD;  [facility];  Service: Endoscopy;  Laterality: N/A;  . GIVENS CAPSULE STUDY N/A 06/03/2015   Procedure: GIVENS CAPSULE STUDY;  Surgeon: Daneil Dolin, MD;  [facility];  Service: Endoscopy;  Laterality: N/A;  0700  . KNEE SURGERY     left knee   . LEFT HEART CATHETERIZATION WITH CORONARY ANGIOGRAM N/A 06/17/2014   Procedure: LEFT HEART CATHETERIZATION WITH CORONARY ANGIOGRAM;  Surgeon: Burnell Blanks, MD;  Location: [facility] CATH LAB;  Service: Cardiovascular;  Laterality: N/A;  . PARTIAL COLECTOMY  02/29/2012   Procedure: PARTIAL COLECTOMY;  Surgeon: Adin Hector, MD;  Location: [facility];  Service: General;  Laterality: Right;  . PROSTATECTOMY    . TONSILLECTOMY    . TRANSURETHRAL RESECTION OF PROSTATE    . VASECTOMY      Prior to Admission medications   Medication Sig Start Date End Date Taking? Authorizing Provider  aspirin EC 81 MG tablet Take 81 mg by mouth daily.   Yes [provider]  B Complex Vitamins (VITAMIN B COMPLEX PO) Take 1 tablet by mouth daily.    Yes [provider]  cetirizine (ZYRTEC) 10 MG tablet Take 10 mg by mouth daily.   Yes [provider]  Cholecalciferol (VITAMIN D3) 2000 units TABS Take by mouth daily.   Yes [provider]  citalopram (CELEXA) 10 MG tablet Take  10 mg by mouth every morning.    Yes [provider]  clopidogrel (PLAVIX) 75 MG tablet TAKE (1) TABLET BY MOUTH ONCE DAILY. 05/03/17  Yes Croitoru, Mihai, MD  Cyanocobalamin (RA VITAMIN B-12 TR) 1000 MCG TBCR Take by mouth daily.    Yes [provider]  folic acid (FOLVITE) 1 MG tablet TAKE (1) TABLET BY MOUTH ONCE DAILY . 07/13/17  Yes Holley Bouche, NP  latanoprost (XALATAN) 0.005 % ophthalmic solution Place 1 drop into both eyes at bedtime.    Yes [provider]  loperamide (IMODIUM) 2 MG capsule Take 2 mg by mouth as needed for diarrhea or loose stools.   Yes [provider]  losartan (COZAAR) 50 MG tablet Take 25 mg by mouth 2 (two) times daily.    Yes [provider]  metoprolol (LOPRESSOR) 50 MG tablet Take 50 mg by mouth 2 (two) times daily.    Yes [provider]  multivitamin-iron-minerals-folic acid (CENTRUM) chewable tablet Chew 1 tablet  by mouth daily.   Yes [provider]  NITROSTAT 0.4 MG SL tablet PLACE ONE (1) TABLET UNDER TONGUE EVERY 5 MINUTES UP TO (3) DOSES AS NEEDED FOR CHEST PAIN. 09/22/14  Yes Croitoru, Mihai, MD  ofloxacin (OCUFLOX) 0.3 % ophthalmic solution Take as directed.   Yes [provider]  pantoprazole (PROTONIX) 40 MG tablet TAKE 2 TABLETS BY MOUTH ONCE DAILY. Patient taking differently: Takes one tablet daily 09/19/14  Yes Croitoru, Mihai, MD  pravastatin (PRAVACHOL) 40 MG tablet TAKE 1 TABLET BY MOUTH AT BEDTIME. 09/13/17  Yes Croitoru, Mihai, MD  Probiotic Product (PROBIOTIC DAILY PO) Take 1 capsule by mouth daily.   Yes [provider]  Multiple Vitamin (MULTIVITAMIN WITH MINERALS) TABS tablet Take 1 tablet by mouth daily.    [provider]  polyethylene glycol-electrolytes (NULYTELY/GOLYTELY) 420 g solution Take 4,000 mLs by mouth once for 1 dose. 10/11/17 10/11/17  Daneil Dolin, MD  Vitamin D, Ergocalciferol, (DRISDOL) 50000 units CAPS capsule TAKE 1 CAPSULE TWICE A  WEEK Patient not taking: Reported on 10/11/2017 05/03/17   Holley Bouche, NP    Allergies as of 10/11/2017 - Review Complete 10/11/2017  Allergen Reaction Noted  . Shellfish allergy Anaphylaxis 01/13/2012  . Sulfa antibiotics Diarrhea and Nausea Only 12/14/2012  . Sulfonamide derivatives Diarrhea and Nausea Only 04/30/2009    Family History  Problem Relation Age of Onset  . Throat cancer Father        dx in his 64s and again in his 24s; pipe and cigar smoker  . Neuropathy Brother   . Prostate cancer Brother 13  . Melanoma Brother        dx in his 59s  . Cancer Paternal Uncle        smoking related cancer  . Heart attack Paternal Grandfather   . Stomach cancer Cousin 91  . Colon cancer Neg Hx     Social History   Socioeconomic History  . Marital status: Married    Spouse name: Not on file  . Number of children: 3  . 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 Highest education level: Not on file  Social Needs  . Financial resource strain: Not on file  . Food insecurity - worry: Not on file  . Food insecurity - inability: Not on file  . Transportation needs - medical: Not on file  . Transportation needs - non-medical: Not on file  Occupational History  . 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 Smoking status: Never Smoker  . Smokeless tobacco: Never Used  Substance and Sexual Activity  . Alcohol use: Yes    Comment: 1 cocktail/month  . Drug use: No  . Sexual activity: Yes    Birth control/protection: None  Other Topics Concern  . Not on file  Social History Narrative  . Not on file    Review of Systems: See HPI, otherwise negative ROS  Physical Exam: BP 137/72   Pulse 75   Temp (!) 96.7 F (35.9 C) (Oral)   Ht 5\' 11"  (1.803 m)   Wt 224 lb 6.4 oz (101.8 kg)   BMI 31.30 kg/m  General:   Alert,  pleasant and cooperative in NAD Neck:  Supple; no masses or thyromegaly. No significant cervical adenopathy. Lungs:  Clear throughout to auscultation.    No wheezes, crackles, or rhonchi. No acute distress. Heart:  Regular rate and rhythm; no murmurs, clicks, rubs,  or gallops. Abdomen: Non-distended, normal bowel sounds.  Soft and nontender without appreciable mass or hepatosplenomegaly.  Pulses:  Normal pulses noted. Extremities:  Without clubbing or edema.  Impression: Very pleasant 83 year old gentleman with long-standing iron deficiency anemia. History of ascending colon cancer  - status post resection with subsequent adenomatous polyps. Non- H. Pylori gastritis and negative capsule study previously. Recent intermittent dark stools on iron and self-limiting hematochezia.  Hx of CLL;  Recent H and H good.  He is on Plavix. History of radiation proctitis and known diverticulosis. No significant drop in hemoglobin;  Vague symptoms of early satiety.  History of CLL, prostate and colon cancer.  Recommendations:  I have offered the patient both a diagnostic colonoscopy and EGD.  The risks, benefits, limitations, imponderables and alternatives regarding both EGD and colonoscopy have been reviewed with the patient. Questions have been answered. All parties agreeable.   Patient is to continue Protonix 40 mg daily for now.  Further recommendations to follow.     Notice: This dictation was prepared with Dragon dictation along with smaller phrase technology. Any transcriptional errors that result from this process are unintentional and may not be corrected upon review.

## 2017-10-17 ENCOUNTER — Ambulatory Visit: Payer: Medicare Other | Admitting: Nurse Practitioner

## 2017-11-01 NOTE — Patient Instructions (Signed)
David Pugh  11/01/2017     @PREFPERIOPPHARMACY @   Your procedure is scheduled on  11/10/2017   Report to [facility] at  11   A.M.  Call this number if you have problems the morning of surgery:  [phone]   Remember:  Do not eat food or drink liquids after midnight.  Take these medicines the morning of surgery with A SIP OF WATER  Zyrtec, celexa, losartan, metoprolol, singulair, protonix.   Do not wear jewelry, make-up or nail polish.  Do not wear lotions, powders, or perfumes, or deodorant.  Do not shave 48 hours prior to surgery.  Men may shave face and neck.  Do not bring valuables to the hospital.  [facility] is not responsible for any belongings or valuables.  Contacts, dentures or bridgework may not be worn into surgery.  Leave your suitcase in the car.  After surgery it may be brought to your room.  For patients admitted to the hospital, discharge time will be determined by your treatment team.  Patients discharged the day of surgery will not be allowed to drive home.   Name and phone number of your driver:   family Special instructions:  Follow the diet and prep instructions given to you by Dr Roseanne Kaufman office.  Please read over the following fact sheets that you were given. Anesthesia Post-op Instructions and Care and Recovery After Surgery       Esophagogastroduodenoscopy Esophagogastroduodenoscopy (EGD) is a procedure to examine the lining of the esophagus, stomach, and first part of the small intestine (duodenum). This procedure is done to check for problems such as inflammation, bleeding, ulcers, or growths. During this procedure, a long, flexible, lighted tube with a camera attached (endoscope) is inserted down the throat. Tell a health care provider about:  Any allergies you have.  All medicines you are taking, including vitamins, herbs, eye drops, creams, and over-the-counter medicines.  Any problems you or family members  have had with anesthetic medicines.  Any blood disorders you have.  Any surgeries you have had.  Any medical conditions you have.  Whether you are pregnant or may be pregnant. What are the risks? Generally, this is a safe procedure. However, problems may occur, including:  Infection.  Bleeding.  A tear (perforation) in the esophagus, stomach, or duodenum.  Trouble breathing.  Excessive sweating.  Spasms of the larynx.  A slowed heartbeat.  Low blood pressure.  What happens before the procedure?  Follow instructions from your health care provider about eating or drinking restrictions.  Ask your health care provider about: ? Changing or stopping your regular medicines. This is especially important if you are taking diabetes medicines or blood thinners. ? Taking medicines such as aspirin and ibuprofen. These medicines can thin your blood. Do not take these medicines before your procedure if your health care provider instructs you not to.  Plan to have someone take you home after the procedure.  If you wear dentures, be ready to remove them before the procedure. What happens during the procedure?  To reduce your risk of infection, your health care team will wash or sanitize their hands.  An IV tube will be put in a vein in your hand or arm. You will get medicines and fluids through this tube.  You will be given one or more of the following: ? A medicine to help you relax (sedative). ? A medicine to numb  the area (local anesthetic). This medicine may be sprayed into your throat. It will make you feel more comfortable and keep you from gagging or coughing during the procedure. ? A medicine for pain.  A mouth guard may be placed in your mouth to protect your teeth and to keep you from biting on the endoscope.  You will be asked to lie on your left side.  The endoscope will be lowered down your throat into your esophagus, stomach, and duodenum.  Air will be put into  the endoscope. This will help your health care provider see better.  The lining of your esophagus, stomach, and duodenum will be examined.  Your health care provider may: ? Take a tissue sample so it can be looked at in a lab (biopsy). ? Remove growths. ? Remove objects (foreign bodies) that are stuck. ? Treat any bleeding with medicines or other devices that stop tissue from bleeding. ? Widen (dilate) or stretch narrowed areas of your esophagus and stomach.  The endoscope will be taken out. The procedure may vary among health care providers and hospitals. What happens after the procedure?  Your blood pressure, heart rate, breathing rate, and blood oxygen level will be monitored often until the medicines you were given have worn off.  Do not eat or drink anything until the numbing medicine has worn off and your gag reflex has returned. This information is not intended to replace advice given to you by your health care provider. Make sure you discuss any questions you have with your health care provider. Document Released: 12/17/2004 Document Revised: 01/22/2016 Document Reviewed: 07/10/2015 Elsevier Interactive Patient Education  2018 [employer]. Esophagogastroduodenoscopy, Care After Refer to this sheet in the next few weeks. These instructions provide you with information about caring for yourself after your procedure. Your health care provider may also give you more specific instructions. Your treatment has been planned according to current medical practices, but problems sometimes occur. Call your health care provider if you have any problems or questions after your procedure. What can I expect after the procedure? After the procedure, it is common to have:  A sore throat.  Nausea.  Bloating.  Dizziness.  Fatigue.  Follow these instructions at home:  Do not eat or drink anything until the numbing medicine (local anesthetic) has worn off and your gag reflex has returned.  You will know that the local anesthetic has worn off when you can swallow comfortably.  Do not drive for 24 hours if you received a medicine to help you relax (sedative).  If your health care provider took a tissue sample for testing during the procedure, make sure to get your test results. This is your responsibility. Ask your health care provider or the department performing the test when your results will be ready.  Keep all follow-up visits as told by your health care provider. This is important. Contact a health care provider if:  You cannot stop coughing.  You are not urinating.  You are urinating less than usual. Get help right away if:  You have trouble swallowing.  You cannot eat or drink.  You have throat or chest pain that gets worse.  You are dizzy or light-headed.  You faint.  You have nausea or vomiting.  You have chills.  You have a fever.  You have severe abdominal pain.  You have black, tarry, or bloody stools. This information is not intended to replace advice given to you by your health care provider. Make sure  you discuss any questions you have with your health care provider. Document Released: 08/02/2012 Document Revised: 01/22/2016 Document Reviewed: 07/10/2015 Elsevier Interactive Patient Education  2018 [employer].  Colonoscopy, Adult A colonoscopy is an exam to look at the large intestine. It is done to check for problems, such as:  Lumps (tumors).  Growths (polyps).  Swelling (inflammation).  Bleeding.  What happens before the procedure? Eating and drinking Follow instructions from your doctor about eating and drinking. These instructions may include:  A few days before the procedure - follow a low-fiber diet. ? Avoid nuts. ? Avoid seeds. ? Avoid dried fruit. ? Avoid raw fruits. ? Avoid vegetables.  1-3 days before the procedure - follow a clear liquid diet. Avoid liquids that have red or purple dye. Drink only clear liquids,  such as: ? Clear broth or bouillon. ? Black coffee or tea. ? Clear juice. ? Clear soft drinks or sports drinks. ? Gelatin dessert. ? Popsicles.  On the day of the procedure - do not eat or drink anything during the 2 hours before the procedure.  Bowel prep If you were prescribed an oral bowel prep:  Take it as told by your doctor. Starting the day before your procedure, you will need to drink a lot of liquid. The liquid will cause you to poop (have bowel movements) until your poop is almost clear or light green.  If your skin or butt gets irritated from diarrhea, you may: ? Wipe the area with wipes that have medicine in them, such as adult wet wipes with aloe and vitamin E. ? Put something on your skin that soothes the area, such as petroleum jelly.  If you throw up (vomit) while drinking the bowel prep, take a break for up to 60 minutes. Then begin the bowel prep again. If you keep throwing up and you cannot take the bowel prep without throwing up, call your doctor.  General instructions  Ask your doctor about changing or stopping your normal medicines. This is important if you take diabetes medicines or blood thinners.  Plan to have someone take you home from the hospital or clinic. What happens during the procedure?  An IV tube may be put into one of your veins.  You will be given medicine to help you relax (sedative).  To reduce your risk of infection: ? Your doctors will wash their hands. ? Your anal area will be washed with soap.  You will be asked to lie on your side with your knees bent.  Your doctor will get a long, thin, flexible tube ready. The tube will have a camera and a light on the end.  The tube will be put into your anus.  The tube will be gently put into your large intestine.  Air will be delivered into your large intestine to keep it open. You may feel some pressure or cramping.  The camera will be used to take photos.  A small tissue sample may be  removed from your body to be looked at under a microscope (biopsy). If any possible problems are found, the tissue will be sent to a lab for testing.  If small growths are found, your doctor may remove them and have them checked for cancer.  The tube that was put into your anus will be slowly removed. The procedure may vary among doctors and hospitals. What happens after the procedure?  Your doctor will check on you often until the medicines you were given have worn off.  Do not drive for 24 hours after the procedure.  You may have a small amount of blood in your poop.  You may pass gas.  You may have mild cramps or bloating in your belly (abdomen).  It is up to you to get the results of your procedure. Ask your doctor, or the department performing the procedure, when your results will be ready. This information is not intended to replace advice given to you by your health care provider. Make sure you discuss any questions you have with your health care provider. Document Released: 09/18/2010 Document Revised: 06/16/2016 Document Reviewed: 10/28/2015 Elsevier Interactive Patient Education  2017 [employer]  Colonoscopy, Adult, Care After This sheet gives you information about how to care for yourself after your procedure. Your health care provider may also give you more specific instructions. If you have problems or questions, contact your health care provider. What can I expect after the procedure? After the procedure, it is common to have:  A small amount of blood in your stool for 24 hours after the procedure.  Some gas.  Mild abdominal cramping or bloating.  Follow these instructions at home: General instructions   For the first 24 hours after the procedure: ? Do not drive or use machinery. ? Do not sign important documents. ? Do not drink alcohol. ? Do your regular daily activities at a slower pace than normal. ? Eat soft, easy-to-digest foods. ? Rest  often.  Take over-the-counter or prescription medicines only as told by your health care provider.  It is up to you to get the results of your procedure. Ask your health care provider, or the department performing the procedure, when your results will be ready. Relieving cramping and bloating  Try walking around when you have cramps or feel bloated.  Apply heat to your abdomen as told by your health care provider. Use a heat source that your health care provider recommends, such as a moist heat pack or a heating pad. ? Place a towel between your skin and the heat source. ? Leave the heat on for 20-30 minutes. ? Remove the heat if your skin turns bright red. This is especially important if you are unable to feel pain, heat, or cold. You may have a greater risk of getting burned. Eating and drinking  Drink enough fluid to keep your urine clear or pale yellow.  Resume your normal diet as instructed by your health care provider. Avoid heavy or fried foods that are hard to digest.  Avoid drinking alcohol for as long as instructed by your health care provider. Contact a health care provider if:  You have blood in your stool 2-3 days after the procedure. Get help right away if:  You have more than a small spotting of blood in your stool.  You pass large blood clots in your stool.  Your abdomen is swollen.  You have nausea or vomiting.  You have a fever.  You have increasing abdominal pain that is not relieved with medicine. This information is not intended to replace advice given to you by your health care provider. Make sure you discuss any questions you have with your health care provider. Document Released: 03/30/2004 Document Revised: 05/10/2016 Document Reviewed: 10/28/2015 Elsevier Interactive Patient Education  2018 [place] Anesthesia is a term that refers to techniques, procedures, and medicines that help a person stay safe and comfortable  during a medical procedure. Monitored anesthesia care, or sedation, is one type of  anesthesia. Your anesthesia specialist may recommend sedation if you will be having a procedure that does not require you to be unconscious, such as:  Cataract surgery.  A dental procedure.  A biopsy.  A colonoscopy.  During the procedure, you may receive a medicine to help you relax (sedative). There are three levels of sedation:  Mild sedation. At this level, you may feel awake and relaxed. You will be able to follow directions.  Moderate sedation. At this level, you will be sleepy. You may not remember the procedure.  Deep sedation. At this level, you will be asleep. You will not remember the procedure.  The more medicine you are given, the deeper your level of sedation will be. Depending on how you respond to the procedure, the anesthesia specialist may change your level of sedation or the type of anesthesia to fit your needs. An anesthesia specialist will monitor you closely during the procedure. Let your health care provider know about:  Any allergies you have.  All medicines you are taking, including vitamins, herbs, eye drops, creams, and over-the-counter medicines.  Any use of steroids (by mouth or as a cream).  Any problems you or family members have had with sedatives and anesthetic medicines.  Any blood disorders you have.  Any surgeries you have had.  Any medical conditions you have, such as sleep apnea.  Whether you are pregnant or may be pregnant.  Any use of cigarettes, alcohol, or street drugs. What are the risks? Generally, this is a safe procedure. However, problems may occur, including:  Getting too much medicine (oversedation).  Nausea.  Allergic reaction to medicines.  Trouble breathing. If this happens, a breathing tube may be used to help with breathing. It will be removed when you are awake and breathing on your own.  Heart trouble.  Lung trouble.  Before  the procedure Staying hydrated Follow instructions from your health care provider about hydration, which may include:  Up to 2 hours before the procedure - you may continue to drink clear liquids, such as water, clear fruit juice, black coffee, and plain tea.  Eating and drinking restrictions Follow instructions from your health care provider about eating and drinking, which may include:  8 hours before the procedure - stop eating heavy meals or foods such as meat, fried foods, or fatty foods.  6 hours before the procedure - stop eating light meals or foods, such as toast or cereal.  6 hours before the procedure - stop drinking milk or drinks that contain milk.  2 hours before the procedure - stop drinking clear liquids.  Medicines Ask your health care provider about:  Changing or stopping your regular medicines. This is especially important if you are taking diabetes medicines or blood thinners.  Taking medicines such as aspirin and ibuprofen. These medicines can thin your blood. Do not take these medicines before your procedure if your health care provider instructs you not to.  Tests and exams  You will have a physical exam.  You may have blood tests done to show: ? How well your kidneys and liver are working. ? How well your blood can clot.  General instructions  Plan to have someone take you home from the hospital or clinic.  If you will be going home right after the procedure, plan to have someone with you for 24 hours.  What happens during the procedure?  Your blood pressure, heart rate, breathing, level of pain and overall condition will be monitored.  An IV  tube will be inserted into one of your veins.  Your anesthesia specialist will give you medicines as needed to keep you comfortable during the procedure. This may mean changing the level of sedation.  The procedure will be performed. After the procedure  Your blood pressure, heart rate, breathing rate, and  blood oxygen level will be monitored until the medicines you were given have worn off.  Do not drive for 24 hours if you received a sedative.  You may: ? Feel sleepy, clumsy, or nauseous. ? Feel forgetful about what happened after the procedure. ? Have a sore throat if you had a breathing tube during the procedure. ? Vomit. This information is not intended to replace advice given to you by your health care provider. Make sure you discuss any questions you have with your health care provider. Document Released: 05/12/2005 Document Revised: 01/23/2016 Document Reviewed: 12/07/2015 Elsevier Interactive Patient Education  2018 [place], Care After These instructions provide you with information about caring for yourself after your procedure. Your health care provider may also give you more specific instructions. Your treatment has been planned according to current medical practices, but problems sometimes occur. Call your health care provider if you have any problems or questions after your procedure. What can I expect after the procedure? After your procedure, it is common to:  Feel sleepy for several hours.  Feel clumsy and have poor balance for several hours.  Feel forgetful about what happened after the procedure.  Have poor judgment for several hours.  Feel nauseous or vomit.  Have a sore throat if you had a breathing tube during the procedure.  Follow these instructions at home: For at least 24 hours after the procedure:   Do not: ? Participate in activities in which you could fall or become injured. ? Drive. ? Use heavy machinery. ? Drink alcohol. ? Take sleeping pills or medicines that cause drowsiness. ? Make important decisions or sign legal documents. ? Take care of children on your own.  Rest. Eating and drinking  Follow the diet that is recommended by your health care provider.  If you vomit, drink water, juice, or soup when you  can drink without vomiting.  Make sure you have little or no nausea before eating solid foods. General instructions  Have a responsible adult stay with you until you are awake and alert.  Take over-the-counter and prescription medicines only as told by your health care provider.  If you smoke, do not smoke without supervision.  Keep all follow-up visits as told by your health care provider. This is important. Contact a health care provider if:  You keep feeling nauseous or you keep vomiting.  You feel light-headed.  You develop a rash.  You have a fever. Get help right away if:  You have trouble breathing. This information is not intended to replace advice given to you by your health care provider. Make sure you discuss any questions you have with your health care provider. Document Released: 12/07/2015 Document Revised: 04/07/2016 Document Reviewed: 12/07/2015 Elsevier Interactive Patient Education  [employer].

## 2017-11-03 ENCOUNTER — Encounter (HOSPITAL_COMMUNITY)
Admission: RE | Admit: 2017-11-03 | Discharge: 2017-11-03 | Disposition: A | Discharge status: Home / Self Care | Payer: Medicare Other | Source: Ambulatory Visit | Attending: Internal Medicine | Admitting: Internal Medicine

## 2017-11-03 ENCOUNTER — Encounter (HOSPITAL_COMMUNITY): Payer: Self-pay

## 2017-11-03 ENCOUNTER — Other Ambulatory Visit: Payer: Self-pay

## 2017-11-03 DIAGNOSIS — Z01812 Encounter for preprocedural laboratory examination: Secondary | ICD-10-CM | POA: Insufficient documentation

## 2017-11-03 HISTORY — DX: Personal history of urinary calculi: Z87.442

## 2017-11-03 LAB — CBC WITH DIFFERENTIAL/PLATELET
BASOS ABS: 0.1 10*3/uL (ref 0.0–0.1)
BASOS PCT: 1 %
EOS ABS: 0.9 10*3/uL — AB (ref 0.0–0.7)
EOS PCT: 7 %
HCT: 43.5 % (ref 39.0–52.0)
Hemoglobin: 14.2 g/dL (ref 13.0–17.0)
Lymphocytes Relative: 36 %
Lymphs Abs: 4.7 10*3/uL — ABNORMAL HIGH (ref 0.7–4.0)
MCH: 30.7 pg (ref 26.0–34.0)
MCHC: 32.6 g/dL (ref 30.0–36.0)
MCV: 94.2 fL (ref 78.0–100.0)
Monocytes Absolute: 1.3 10*3/uL — ABNORMAL HIGH (ref 0.1–1.0)
Monocytes Relative: 10 %
Neutro Abs: 6.4 10*3/uL (ref 1.7–7.7)
Neutrophils Relative %: 48 %
PLATELETS: 350 10*3/uL (ref 150–400)
RBC: 4.62 MIL/uL (ref 4.22–5.81)
RDW: 12.7 % (ref 11.5–15.5)
WBC: 13.3 10*3/uL — AB (ref 4.0–10.5)

## 2017-11-03 LAB — BASIC METABOLIC PANEL
ANION GAP: 9 (ref 5–15)
BUN: 14 mg/dL (ref 6–20)
CO2: 25 mmol/L (ref 22–32)
Calcium: 8.8 mg/dL — ABNORMAL LOW (ref 8.9–10.3)
Chloride: 107 mmol/L (ref 101–111)
Creatinine, Ser: 1.11 mg/dL (ref 0.61–1.24)
GFR calc Af Amer: >60 mL/min (ref >60)
GFR calc non Af Amer: >60 mL/min (ref >60)
Glucose, Bld: 102 mg/dL — ABNORMAL HIGH (ref 65–99)
POTASSIUM: 4 mmol/L (ref 3.5–5.1)
SODIUM: 141 mmol/L (ref 135–145)

## 2017-11-10 ENCOUNTER — Encounter (HOSPITAL_COMMUNITY): Payer: Self-pay

## 2017-11-10 ENCOUNTER — Ambulatory Visit (HOSPITAL_COMMUNITY): Payer: Medicare Other | Admitting: Anesthesiology

## 2017-11-10 ENCOUNTER — Ambulatory Visit (HOSPITAL_COMMUNITY)
Admission: RE | Admit: 2017-11-10 | Discharge: 2017-11-10 | Disposition: A | Discharge status: Home / Self Care | Payer: Medicare Other | Source: Ambulatory Visit | Attending: Internal Medicine | Admitting: Internal Medicine

## 2017-11-10 ENCOUNTER — Encounter (HOSPITAL_COMMUNITY)
Admission: RE | Disposition: A | Discharge status: Home / Self Care | Payer: Self-pay | Source: Ambulatory Visit | Attending: Internal Medicine

## 2017-11-10 DIAGNOSIS — K627 Radiation proctitis: Secondary | ICD-10-CM | POA: Insufficient documentation

## 2017-11-10 DIAGNOSIS — Z86010 Personal history of colonic polyps: Secondary | ICD-10-CM | POA: Diagnosis not present

## 2017-11-10 DIAGNOSIS — Z8546 Personal history of malignant neoplasm of prostate: Secondary | ICD-10-CM | POA: Diagnosis not present

## 2017-11-10 DIAGNOSIS — Z7902 Long term (current) use of antithrombotics/antiplatelets: Secondary | ICD-10-CM | POA: Insufficient documentation

## 2017-11-10 DIAGNOSIS — K3184 Gastroparesis: Secondary | ICD-10-CM | POA: Insufficient documentation

## 2017-11-10 DIAGNOSIS — Z955 Presence of coronary angioplasty implant and graft: Secondary | ICD-10-CM | POA: Insufficient documentation

## 2017-11-10 DIAGNOSIS — K641 Second degree hemorrhoids: Secondary | ICD-10-CM | POA: Diagnosis not present

## 2017-11-10 DIAGNOSIS — D124 Benign neoplasm of descending colon: Secondary | ICD-10-CM

## 2017-11-10 DIAGNOSIS — K6289 Other specified diseases of anus and rectum: Secondary | ICD-10-CM

## 2017-11-10 DIAGNOSIS — Z85038 Personal history of other malignant neoplasm of large intestine: Secondary | ICD-10-CM | POA: Insufficient documentation

## 2017-11-10 DIAGNOSIS — Z9049 Acquired absence of other specified parts of digestive tract: Secondary | ICD-10-CM | POA: Insufficient documentation

## 2017-11-10 DIAGNOSIS — K219 Gastro-esophageal reflux disease without esophagitis: Secondary | ICD-10-CM | POA: Diagnosis not present

## 2017-11-10 DIAGNOSIS — I10 Essential (primary) hypertension: Secondary | ICD-10-CM | POA: Diagnosis not present

## 2017-11-10 DIAGNOSIS — Z79899 Other long term (current) drug therapy: Secondary | ICD-10-CM | POA: Diagnosis not present

## 2017-11-10 DIAGNOSIS — K921 Melena: Secondary | ICD-10-CM

## 2017-11-10 DIAGNOSIS — D509 Iron deficiency anemia, unspecified: Secondary | ICD-10-CM | POA: Diagnosis not present

## 2017-11-10 DIAGNOSIS — K5730 Diverticulosis of large intestine without perforation or abscess without bleeding: Secondary | ICD-10-CM | POA: Diagnosis not present

## 2017-11-10 DIAGNOSIS — I2510 Atherosclerotic heart disease of native coronary artery without angina pectoris: Secondary | ICD-10-CM | POA: Insufficient documentation

## 2017-11-10 DIAGNOSIS — R6881 Early satiety: Secondary | ICD-10-CM | POA: Diagnosis not present

## 2017-11-10 DIAGNOSIS — F4310 Post-traumatic stress disorder, unspecified: Secondary | ICD-10-CM | POA: Diagnosis not present

## 2017-11-10 DIAGNOSIS — K317 Polyp of stomach and duodenum: Secondary | ICD-10-CM | POA: Diagnosis not present

## 2017-11-10 DIAGNOSIS — Z7982 Long term (current) use of aspirin: Secondary | ICD-10-CM | POA: Insufficient documentation

## 2017-11-10 DIAGNOSIS — I252 Old myocardial infarction: Secondary | ICD-10-CM | POA: Insufficient documentation

## 2017-11-10 DIAGNOSIS — L719 Rosacea, unspecified: Secondary | ICD-10-CM | POA: Diagnosis not present

## 2017-11-10 HISTORY — PX: HOT HEMOSTASIS: SHX5433

## 2017-11-10 HISTORY — PX: COLONOSCOPY WITH PROPOFOL: SHX5780

## 2017-11-10 HISTORY — PX: POLYPECTOMY: SHX5525

## 2017-11-10 HISTORY — PX: ESOPHAGOGASTRODUODENOSCOPY (EGD) WITH PROPOFOL: SHX5813

## 2017-11-10 SURGERY — ESOPHAGOGASTRODUODENOSCOPY (EGD) WITH PROPOFOL
Anesthesia: Monitor Anesthesia Care

## 2017-11-10 MED ORDER — PROPOFOL 10 MG/ML IV BOLUS
INTRAVENOUS | Status: DC | PRN
  Administered 2017-11-10: 30 mg via INTRAVENOUS

## 2017-11-10 MED ORDER — LIDOCAINE HCL (PF) 1 % IJ SOLN
INTRAMUSCULAR | Status: DC | PRN
  Administered 2017-11-10: 20 mg

## 2017-11-10 MED ORDER — LIDOCAINE VISCOUS 2 % MT SOLN
15.0000 mL | Freq: Once | OROMUCOSAL | Status: AC
  Administered 2017-11-10: 15 mL via OROMUCOSAL

## 2017-11-10 MED ORDER — PROPOFOL 500 MG/50ML IV EMUL
INTRAVENOUS | Status: DC | PRN
  Administered 2017-11-10: 100 ug/kg/min via INTRAVENOUS

## 2017-11-10 MED ORDER — PROPOFOL 10 MG/ML IV BOLUS
INTRAVENOUS | Status: AC
  Filled 2017-11-10: qty 20

## 2017-11-10 MED ORDER — MIDAZOLAM HCL 2 MG/2ML IJ SOLN
1.0000 mg | INTRAMUSCULAR | Status: AC
  Administered 2017-11-10 (×2): 2 mg via INTRAVENOUS
  Filled 2017-11-10 (×2): qty 2

## 2017-11-10 MED ORDER — LACTATED RINGERS IV SOLN
INTRAVENOUS | Status: DC
  Administered 2017-11-10: 09:00:00 via INTRAVENOUS

## 2017-11-10 MED ORDER — FENTANYL CITRATE (PF) 100 MCG/2ML IJ SOLN
INTRAMUSCULAR | Status: AC
  Filled 2017-11-10: qty 2

## 2017-11-10 MED ORDER — LIDOCAINE VISCOUS 2 % MT SOLN
OROMUCOSAL | Status: AC
  Filled 2017-11-10: qty 15

## 2017-11-10 MED ORDER — FENTANYL CITRATE (PF) 100 MCG/2ML IJ SOLN
25.0000 ug | Freq: Once | INTRAMUSCULAR | Status: AC
  Administered 2017-11-10: 25 ug via INTRAVENOUS

## 2017-11-10 NOTE — Op Note (Signed)
[facility] Patient Name: David Pugh Procedure Date: 11/10/2017 9:51 AM MRN: [id number] Date of Birth: [birth year] Attending MD: Norvel Richards , MD CSN: [id number] Age: 83 Admit Type: Outpatient Procedure:                Colonoscopy Indications:              Hematochezia Providers:                Norvel Richards, MD, Gwenlyn Fudge RN, RN,                            Randa Spike, Technician Referring MD:             Zella Richer. [name] Medicines:                Propofol per Anesthesia Complications:            No immediate complications. Estimated Blood Loss:     Estimated blood loss was minimal. Procedure:                Pre-Anesthesia Assessment:                           - Prior to the procedure, a History and Physical                            was performed, and patient medications and                            allergies were reviewed. The patient's tolerance of                            previous anesthesia was also reviewed. The risks                            and benefits of the procedure and the sedation                            options and risks were discussed with the patient.                            All questions were answered, and informed consent                            was obtained. Prior Anticoagulants: The patient                            last took Plavix (clopidogrel) 1 day prior to the                            procedure. ASA Grade Assessment: III - A patient                            with severe systemic disease. After reviewing the  risks and benefits, the patient was deemed in                            satisfactory condition to undergo the procedure.                           After obtaining informed consent, the colonoscope                            was passed under direct vision. Throughout the                            procedure, the patient's blood pressure, pulse, and   oxygen saturations were monitored continuously. The                            EC38-[id number] ([id number]) scope was introduced through                            the and advanced to the 10 cm into the ileum. The                            colonoscopy was performed without difficulty. The                            quality of the bowel preparation was adequate. The                            terminal ileum and the rectum were photographed.                            The entire colon was well visualized. Scope In: 9:55:25 AM Scope Out: 10:10:37 AM Scope Withdrawal Time: 0 hours 9 minutes 47 seconds  Total Procedure Duration: 0 hours 15 minutes 12 seconds  Findings:      The perianal and digital rectal examinations were normal.      Non-bleeding internal hemorrhoids were found during endoscopy. The       hemorrhoids were moderate, medium-sized and Grade II (internal       hemorrhoids that prolapse but reduce spontaneously). 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 Rectal mucosa frible in this area.      Scattered small and large-mouthed diverticula were found in the sigmoid       colon, descending colon and transverse colon.      A polyp was found in the descending colon. The polyp was       semi-pedunculated. The polyp was removed with a cold snare. Resection       and retrieval were complete. status post right hemicolectomy. Distal 10       cm of neoterminal ileum also appeared normal.      APC treatment of abnormal rectal mucosa undertaken. Multiple       applications utilizing the circular probe at 20 J each utilized to       ablate the neovascular mucosa. This was done without difficulty or  apparent complication. Impression:               Abnormal rectal mucosa as described?"ablated.                           Status post right hemicolectomy. I suspect recent                            rectal bleeding secondary to  radiation proctitis                            more than hemorrhoids.                           - Non-bleeding internal hemorrhoids.                           - Diverticulosis in the sigmoid colon, in the                            descending colon and in the transverse colon.                           - One polyp in the descending colon, removed with a                            cold snare. Resected and retrieved. Moderate Sedation:      Moderate (conscious) sedation was personally administered by an       anesthesia professional. The following parameters were monitored: oxygen       saturation, heart rate, blood pressure, respiratory rate, EKG, adequacy       of pulmonary ventilation, and response to care. Total physician       intraservice time was 31 minutes. Recommendation:           - No repeat colonoscopy due to age.                           - Return to GI clinic in 3 months.                           - Await pathology results.                           - Resume previous diet. Procedure Code(s):        --- Professional ---                           [phone], Colonoscopy, flexible; with removal of                            tumor(s), polyp(s), or other lesion(s) by snare                            technique Diagnosis Code(s):        --- Professional ---  K64.1, Second degree hemorrhoids                           D12.4, Benign neoplasm of descending colon                           K92.1, Melena (includes Hematochezia)                           K57.30, Diverticulosis of large intestine without                            perforation or abscess without bleeding CPT copyright 2016 [employer]. All rights reserved. The codes documented in this report are preliminary and upon coder review may  be revised to meet current compliance requirements. Cristopher Estimable. Ayianna Darnold, MD Norvel Richards, MD 11/10/2017 10:23:21 AM This report has been signed  electronically. Number of Addenda: 0

## 2017-11-10 NOTE — Transfer of Care (Signed)
Immediate Anesthesia Transfer of Care Note  Patient: David Pugh.  Procedure(s) Performed: ESOPHAGOGASTRODUODENOSCOPY (EGD) WITH PROPOFOL (N/A ) COLONOSCOPY WITH PROPOFOL (N/A ) POLYPECTOMY HOT HEMOSTASIS (ARGON PLASMA COAGULATION/BICAP)  Patient Location: PACU  Anesthesia Type:MAC  Level of Consciousness: awake, oriented and patient cooperative  Airway & Oxygen Therapy: Patient Spontanous Breathing  Post-op Assessment: Report given to RN, Post -op Vital signs reviewed and stable and Patient moving all extremities X 4  Post vital signs: Reviewed and stable  Last Vitals:  Vitals:   11/10/17 0920 11/10/17 0925  BP: 130/85 (!) 156/81  Pulse:    Resp: 14 13  Temp:    SpO2: 98% 99%    Last Pain:  Vitals:   11/10/17 0732  TempSrc: Oral      Patients Stated Pain Goal: 5 (76/28/31 5176)  Complications: No apparent anesthesia complications

## 2017-11-10 NOTE — Op Note (Signed)
[facility] Patient Name: David Pugh Procedure Date: 11/10/2017 9:30 AM MRN: [id number] Date of Birth: Sep 04, 1934 Attending MD: Norvel Richards , MD CSN: [id number] Age: 83 Admit Type: Outpatient Procedure:                Upper GI endoscopy Indications:              Early satiety Providers:                Norvel Richards, MD, Jeanann Lewandowsky. Sharon Seller, RN,                            Randa Spike, Technician Referring MD:              Medicines:                Propofol per Anesthesia Complications:            No immediate complications. Estimated Blood Loss:     Estimated blood loss was minimal. Procedure:                Pre-Anesthesia Assessment:                           - Prior to the procedure, a History and Physical                            was performed, and patient medications and                            allergies were reviewed. The patient's tolerance of                            previous anesthesia was also reviewed. The risks                            and benefits of the procedure and the sedation                            options and risks were discussed with the patient.                            All questions were answered, and informed consent                            was obtained. Prior Anticoagulants: The patient                            last took Plavix (clopidogrel) 1 day prior to the                            procedure. ASA Grade Assessment: III - A patient                            with severe systemic disease. After reviewing the  risks and benefits, the patient was deemed in                            satisfactory condition to undergo the procedure.                           After obtaining informed consent, the endoscope was                            passed under direct vision. Throughout the                            procedure, the patient's blood pressure, pulse, and                            oxygen  saturations were monitored continuously. The                            [id number]) scope was introduced through the                            and advanced to the third part of duodenum. The                            upper GI endoscopy was accomplished without                            difficulty. The upper GI endoscopy was accomplished                            without difficulty. Scope In: 9:44:25 AM Scope Out: 9:50:05 AM Total Procedure Duration: 0 hours 5 minutes 40 seconds  Findings:      The examined esophagus was normal.      Multiple pedunculated and sessile polyps with no stigmata of recent       bleeding were found in the gastric body. This was biopsied with a cold       forceps for histology. Estimated blood loss was minimal. Gastric mucosa       appeared normal otherwise.      The duodenal bulb, second portion of the duodenum and third portion of       the duodenum were normal. Impression:               - Normal esophagus.                           - Multiple gastric polyps. Biopsied.                           - Normal duodenal bulb, second portion of the                            duodenum and third portion of the duodenum. Moderate Sedation:      Moderate (conscious) sedation was personally administered by an       anesthesia professional. The following parameters were monitored:  oxygen       saturation, heart rate, blood pressure, respiratory rate, EKG, adequacy       of pulmonary ventilation, and response to care. Total physician       intraservice time was 12 minutes. Recommendation:           - Patient has a contact number available for                            emergencies. The signs and symptoms of potential                            delayed complications were discussed with the                            patient. Return to normal activities tomorrow.                            Written discharge instructions were provided to the                             patient.                           - Resume previous diet.                           - Continue present medications.                           - No repeat upper endoscopy.                           - Return to GI office (date not yet determined).                            see colonoscopy report. Procedure Code(s):        --- Professional ---                           [phone], Esophagogastroduodenoscopy, flexible,                            transoral; with biopsy, single or multiple Diagnosis Code(s):        --- Professional ---                           K31.7, Polyp of stomach and duodenum                           R68.81, Early satiety CPT copyright 2016 [employer] Medical Association. All rights reserved. The codes documented in this report are preliminary and upon coder review may  be revised to meet current compliance requirements. Cristopher Estimable. Dalvin Clipper, MD Norvel Richards, MD 11/10/2017 10:13:11 AM This report has been signed electronically. Number of Addenda: 0

## 2017-11-10 NOTE — Anesthesia Preprocedure Evaluation (Signed)
Anesthesia Evaluation  Patient identified by MRN, date of birth, ID band Patient awake    Reviewed: Allergy & Precautions, H&P , NPO status , Patient's Chart, lab work & pertinent test results, reviewed documented beta blocker date and time   Airway Mallampati: II  TM Distance: >3 FB Neck ROM: Full    Dental no notable dental hx.    Pulmonary neg pulmonary ROS,    Pulmonary exam normal breath sounds clear to auscultation       Cardiovascular hypertension, Pt. on medications and Pt. on home beta blockers + angina + CAD, + Past MI and + Cardiac Stents  + dysrhythmias  Rhythm:Regular Rate:Normal     Neuro/Psych PSYCHIATRIC DISORDERS Anxiety Agent Orange Exposure. PTSD  Neuromuscular disease    GI/Hepatic Neg liver ROS, hiatal hernia, GERD  Medicated,  Endo/Other  negative endocrine ROS  Renal/GU negative Renal ROS  negative genitourinary   Musculoskeletal negative musculoskeletal ROS (+)   Abdominal   Peds negative pediatric ROS (+)  Hematology negative hematology ROS (+) anemia ,   Anesthesia Other Findings   Reproductive/Obstetrics negative OB ROS                             Anesthesia Physical Anesthesia Plan  ASA: III  Anesthesia Plan: MAC   Post-op Pain Management:    Induction: Intravenous  PONV Risk Score and Plan:   Airway Management Planned: Simple Face Mask  Additional Equipment:   Intra-op Plan:   Post-operative Plan:   Informed Consent: I have reviewed the patients History and Physical, chart, labs and discussed the procedure including the risks, benefits and alternatives for the proposed anesthesia with the patient or authorized representative who has indicated his/her understanding and acceptance.     Plan Discussed with:   Anesthesia Plan Comments:         Anesthesia Quick Evaluation

## 2017-11-10 NOTE — Anesthesia Postprocedure Evaluation (Signed)
Anesthesia Post Note  Patient: David Pugh.  Procedure(s) Performed: ESOPHAGOGASTRODUODENOSCOPY (EGD) WITH PROPOFOL (N/A ) COLONOSCOPY WITH PROPOFOL (N/A ) POLYPECTOMY HOT HEMOSTASIS (ARGON PLASMA COAGULATION/BICAP)  Patient location during evaluation: PACU Anesthesia Type: MAC Level of consciousness: awake, awake and alert, oriented and patient cooperative Pain management: satisfactory to patient Vital Signs Assessment: post-procedure vital signs reviewed and stable Respiratory status: spontaneous breathing, nonlabored ventilation and respiratory function stable Cardiovascular status: blood pressure returned to baseline and stable Postop Assessment: no headache, no apparent nausea or vomiting and adequate PO intake Anesthetic complications: no     Last Vitals:  Vitals:   11/10/17 1038 11/10/17 1046  BP: 113/63 (!) 132/58  Pulse: (!) 50 (!) 56  Resp: 13 16  Temp:  36.4 C  SpO2: 97% 96%    Last Pain:  Vitals:   11/10/17 1046  TempSrc: Oral  PainSc:                  Verlin Grills

## 2017-11-10 NOTE — Interval H&P Note (Signed)
History and Physical Interval Note:  11/10/2017 9:25 AM  David Pugh.  has presented today for surgery, with the diagnosis of melena, hematochezia, hx of colon cancer  The various methods of treatment have been discussed with the patient and family. After consideration of risks, benefits and other options for treatment, the patient has consented to  Procedure(s) with comments: ESOPHAGOGASTRODUODENOSCOPY (EGD) WITH PROPOFOL (N/A) - 9:45am COLONOSCOPY WITH PROPOFOL (N/A) as a surgical intervention .  The patient's history has been reviewed, patient examined, no change in status, stable for surgery.  I have reviewed the patient's chart and labs.  Questions were answered to the patient's satisfaction.     David Pugh  No change.  Diagnostic EGD and colonoscopy per plan. Patient remains on Plavix per plan.  The risks, benefits, limitations, imponderables and alternatives regarding both EGD and colonoscopy have been reviewed with the patient. Questions have been answered. All parties agreeable.

## 2017-11-10 NOTE — Discharge Instructions (Signed)
°Colonoscopy °Discharge Instructions ° °Read the instructions outlined below and refer to this sheet in the next few weeks. These discharge instructions provide you with general information on caring for yourself after you leave the hospital. Your doctor may also give you specific instructions. While your treatment has been planned according to the most current medical practices available, unavoidable complications occasionally occur. If you have any problems or questions after discharge, call Dr. Rourk at [phone]. °ACTIVITY °· You may resume your regular activity, but move at a slower pace for the next 24 hours.  °· Take frequent rest periods for the next 24 hours.  °· Walking will help get rid of the air and reduce the bloated feeling in your belly (abdomen).  °· No driving for 24 hours (because of the medicine (anesthesia) used during the test).   °· Do not sign any important legal documents or operate any machinery for 24 hours (because of the anesthesia used during the test).  °NUTRITION °· Drink plenty of fluids.  °· You may resume your normal diet as instructed by your doctor.  °· Begin with a light meal and progress to your normal diet. Heavy or fried foods are harder to digest and may make you feel sick to your stomach (nauseated).  °· Avoid alcoholic beverages for 24 hours or as instructed.  °MEDICATIONS °· You may resume your normal medications unless your doctor tells you otherwise.  °WHAT YOU CAN EXPECT TODAY °· Some feelings of bloating in the abdomen.  °· Passage of more gas than usual.  °· Spotting of blood in your stool or on the toilet paper.  °IF YOU HAD POLYPS REMOVED DURING THE COLONOSCOPY: °· No aspirin products for 7 days or as instructed.  °· No alcohol for 7 days or as instructed.  °· Eat a soft diet for the next 24 hours.  °FINDING OUT THE RESULTS OF YOUR TEST °Not all test results are available during your visit. If your test results are not back during the visit, make an appointment  with your caregiver to find out the results. Do not assume everything is normal if you have not heard from your caregiver or the medical facility. It is important for you to follow up on all of your test results.  °SEEK IMMEDIATE MEDICAL ATTENTION IF: °· You have more than a spotting of blood in your stool.  °· Your belly is swollen (abdominal distention).  °· You are nauseated or vomiting.  °· You have a temperature over 101.  °· You have abdominal pain or discomfort that is severe or gets worse throughout the day.  °EGD °Discharge instructions °Please read the instructions outlined below and refer to this sheet in the next few weeks. These discharge instructions provide you with general information on caring for yourself after you leave the hospital. Your doctor may also give you specific instructions. While your treatment has been planned according to the most current medical practices available, unavoidable complications occasionally occur. If you have any problems or questions after discharge, please call your doctor. °ACTIVITY °· You may resume your regular activity but move at a slower pace for the next 24 hours.  °· Take frequent rest periods for the next 24 hours.  °· Walking will help expel (get rid of) the air and reduce the bloated feeling in your abdomen.  °· No driving for 24 hours (because of the anesthesia (medicine) used during the test).  °· You may shower.  °· Do not sign any important   legal documents or operate any machinery for 24 hours (because of the anesthesia used during the test).  NUTRITION  Drink plenty of fluids.   You may resume your normal diet.   Begin with a light meal and progress to your normal diet.   Avoid alcoholic beverages for 24 hours or as instructed by your caregiver.  MEDICATIONS  You may resume your normal medications unless your caregiver tells you otherwise.  WHAT YOU CAN EXPECT TODAY  You may experience abdominal discomfort such as a feeling of fullness  or gas pains.  FOLLOW-UP  Your doctor will discuss the results of your test with you.  SEEK IMMEDIATE MEDICAL ATTENTION IF ANY OF THE FOLLOWING OCCUR:  Excessive nausea (feeling sick to your stomach) and/or vomiting.   Severe abdominal pain and distention (swelling).   Trouble swallowing.   Temperature over 101 F (37.8 C).   Rectal bleeding or vomiting of blood.    Diverticulosis, colon polyp and hemorrhoid information provid  Begin Benefber 1 tablespoon daily.  Further recommendations to follow pending review of pathology report       Diverticulosis Diverticulosis is a condition that develops when small pouches (diverticula) form in the wall of the large intestine (colon). The colon is where water is absorbed and stool is formed. The pouches form when the inside layer of the colon pushes through weak spots in the outer layers of the colon. You may have a few pouches or many of them. What are the causes? The cause of this condition is not known. What increases the risk? The following factors may make you more likely to develop this condition:  Being older than age 31. Your risk for this condition increases with age. Diverticulosis is rare among people younger than age 28. By age 10, many people have it.  Eating a low-fiber diet.  Having frequent constipation.  Being overweight.  Not getting enough exercise.  Smoking.  Taking over-the-counter pain medicines, like aspirin and ibuprofen.  Having a family history of diverticulosis.  What are the signs or symptoms? In most people, there are no symptoms of this condition. If you do have symptoms, they may include:  Bloating.  Cramps in the abdomen.  Constipation or diarrhea.  Pain in the lower left side of the abdomen.  How is this diagnosed? This condition is most often diagnosed during an exam for other colon problems. Because diverticulosis usually has no symptoms, it often cannot be diagnosed  independently. This condition may be diagnosed by:  Using a flexible scope to examine the colon (colonoscopy).  Taking an X-ray of the colon after dye has been put into the colon (barium enema).  Doing a CT scan.  How is this treated? You may not need treatment for this condition if you have never developed an infection related to diverticulosis. If you have had an infection before, treatment may include:  Eating a high-fiber diet. This may include eating more fruits, vegetables, and grains.  Taking a fiber supplement.  Taking a live bacteria supplement (probiotic).  Taking medicine to relax your colon.  Taking antibiotic medicines.  Follow these instructions at home:  Drink 6-8 glasses of water or more each day to prevent constipation.  Try not to strain when you have a bowel movement.  If you have had an infection before: ? Eat more fiber as directed by your health care provider or your diet and nutrition specialist (dietitian). ? Take a fiber supplement or probiotic, if your health care provider approves.  Take over-the-counter and prescription medicines only as told by your health care provider.  If you were prescribed an antibiotic, take it as told by your health care provider. Do not stop taking the antibiotic even if you start to feel better.  Keep all follow-up visits as told by your health care provider. This is important. Contact a health care provider if:  You have pain in your abdomen.  You have bloating.  You have cramps.  You have not had a bowel movement in 3 days. Get help right away if:  Your pain gets worse.  Your bloating becomes very bad.  You have a fever or chills, and your symptoms suddenly get worse.  You vomit.  You have bowel movements that are bloody or black.  You have bleeding from your rectum. Summary  Diverticulosis is a condition that develops when small pouches (diverticula) form in the wall of the large intestine  (colon).  You may have a few pouches or many of them.  This condition is most often diagnosed during an exam for other colon problems.  If you have had an infection related to diverticulosis, treatment may include increasing the fiber in your diet, taking supplements, or taking medicines. This information is not intended to replace advice given to you by your health care provider. Make sure you discuss any questions you have with your health care provider. Document Released: 05/13/2004 Document Revised: 07/05/2016 Document Reviewed: 07/05/2016 Elsevier Interactive Patient Education  2017 [place].     Colon Polyps Polyps are tissue growths inside the body. Polyps can grow in many places, including the large intestine (colon). A polyp may be a round bump or a mushroom-shaped growth. You could have one polyp or several. Most colon polyps are noncancerous (benign). However, some colon polyps can become cancerous over time. What are the causes? The exact cause of colon polyps is not known. What increases the risk? This condition is more likely to develop in people who:  Have a family history of colon cancer or colon polyps.  Are older than 74 or older than 45 if they are African American.  Have inflammatory bowel disease, such as ulcerative colitis or Crohn disease.  Are overweight.  Smoke cigarettes.  Do not get enough exercise.  Drink too much alcohol.  Eat a diet that is: ? High in fat and red meat. ? Low in fiber.  Had childhood cancer that was treated with abdominal radiation.  What are the signs or symptoms? Most polyps do not cause symptoms. If you have symptoms, they may include:  Blood coming from your rectum when having a bowel movement.  Blood in your stool.The stool may look dark red or black.  A change in bowel habits, such as constipation or diarrhea.  How is this diagnosed? This condition is diagnosed with a colonoscopy. This is a procedure that  uses a lighted, flexible scope to look at the inside of your colon. How is this treated? Treatment for this condition involves removing any polyps that are found. Those polyps will then be tested for cancer. If cancer is found, your health care provider will talk to you about options for colon cancer treatment. Follow these instructions at home: Diet  Eat plenty of fiber, such as fruits, vegetables, and whole grains.  Eat foods that are high in calcium and vitamin D, such as milk, cheese, yogurt, eggs, liver, fish, and broccoli.  Limit foods high in fat, red meats, and processed meats, such as hot dogs,  sausage, bacon, and lunch meats.  Maintain a healthy weight, or lose weight if recommended by your health care provider. General instructions  Do not smoke cigarettes.  Do not drink alcohol excessively.  Keep all follow-up visits as told by your health care provider. This is important. This includes keeping regularly scheduled colonoscopies. Talk to your health care provider about when you need a colonoscopy.  Exercise every day or as told by your health care provider. Contact a health care provider if:  You have new or worsening bleeding during a bowel movement.  You have new or increased blood in your stool.  You have a change in bowel habits.  You unexpectedly lose weight. This information is not intended to replace advice given to you by your health care provider. Make sure you discuss any questions you have with your health care provider. Document Released: 05/12/2004 Document Revised: 01/22/2016 Document Reviewed: 07/07/2015 Elsevier Interactive Patient Education  2018 [employer].     Hemorrhoids Hemorrhoids are swollen veins in and around the rectum or anus. There are two types of hemorrhoids:  Internal hemorrhoids. These occur in the veins that are just inside the rectum. They may poke through to the outside and become irritated and painful.  External hemorrhoids.  These occur in the veins that are outside of the anus and can be felt as a painful swelling or hard lump near the anus.  Most hemorrhoids do not cause serious problems, and they can be managed with home treatments such as diet and lifestyle changes. If home treatments do not help your symptoms, procedures can be done to shrink or remove the hemorrhoids. What are the causes? This condition is caused by increased pressure in the anal area. This pressure may result from various things, including:  Constipation.  Straining to have a bowel movement.  Diarrhea.  Pregnancy.  Obesity.  Sitting for long periods of time.  Heavy lifting or other activity that causes you to strain.  Anal sex.  What are the signs or symptoms? Symptoms of this condition include:  Pain.  Anal itching or irritation.  Rectal bleeding.  Leakage of stool (feces).  Anal swelling.  One or more lumps around the anus.  How is this diagnosed? This condition can often be diagnosed through a visual exam. Other exams or tests may also be done, such as:  Examination of the rectal area with a gloved hand (digital rectal exam).  Examination of the anal canal using a small tube (anoscope).  A blood test, if you have lost a significant amount of blood.  A test to look inside the colon (sigmoidoscopy or colonoscopy).  How is this treated? This condition can usually be treated at home. However, various procedures may be done if dietary changes, lifestyle changes, and other home treatments do not help your symptoms. These procedures can help make the hemorrhoids smaller or remove them completely. Some of these procedures involve surgery, and others do not. Common procedures include:  Rubber band ligation. Rubber bands are placed at the base of the hemorrhoids to cut off the blood supply to them.  Sclerotherapy. Medicine is injected into the hemorrhoids to shrink them.  Infrared coagulation. A type of light energy  is used to get rid of the hemorrhoids.  Hemorrhoidectomy surgery. The hemorrhoids are surgically removed, and the veins that supply them are tied off.  Stapled hemorrhoidopexy surgery. A circular stapling device is used to remove the hemorrhoids and use staples to cut off the blood supply to  them.  Follow these instructions at home: Eating and drinking  Eat foods that have a lot of fiber in them, such as whole grains, beans, nuts, fruits, and vegetables. Ask your health care provider about taking products that have added fiber (fiber supplements).  Drink enough fluid to keep your urine clear or pale yellow. Managing pain and swelling  Take warm sitz baths for 20 minutes, 3-4 times a day to ease pain and discomfort.  If directed, apply ice to the affected area. Using ice packs between sitz baths may be helpful. ? Put ice in a plastic bag. ? Place a towel between your skin and the bag. ? Leave the ice on for 20 minutes, 2-3 times a day. General instructions  Take over-the-counter and prescription medicines only as told by your health care provider.  Use medicated creams or suppositories as told.  Exercise regularly.  Go to the bathroom when you have the urge to have a bowel movement. Do not wait.  Avoid straining to have bowel movements.  Keep the anal area dry and clean. Use wet toilet paper or moist towelettes after a bowel movement.  Do not sit on the toilet for long periods of time. This increases blood pooling and pain. Contact a health care provider if:  You have increasing pain and swelling that are not controlled by treatment or medicine.  You have uncontrolled bleeding.  You have difficulty having a bowel movement, or you are unable to have a bowel movement.  You have pain or inflammation outside the area of the hemorrhoids. This information is not intended to replace advice given to you by your health care provider. Make sure you discuss any questions you have with  your health care provider. Document Released: 08/13/2000 Document Revised: 01/14/2016 Document Reviewed: 04/30/2015 Elsevier Interactive Patient Education  2018 [place] POST-ANESTHESIA  IMMEDIATELY FOLLOWING SURGERY:  Do not drive or operate machinery for the first twenty four hours after surgery.  Do not make any important decisions for twenty four hours after surgery or while taking narcotic pain medications or sedatives.  If you develop intractable nausea and vomiting or a severe headache please notify your doctor immediately.  FOLLOW-UP:  Please make an appointment with your surgeon as instructed. You do not need to follow up with anesthesia unless specifically instructed to do so.  WOUND CARE INSTRUCTIONS (if applicable):  Keep a dry clean dressing on the anesthesia/puncture wound site if there is drainage.  Once the wound has quit draining you may leave it open to air.  Generally you should leave the bandage intact for twenty four hours unless there is drainage.  If the epidural site drains for more than 36-48 hours please call the anesthesia department.  QUESTIONS?:  Please feel free to call your physician or the hospital operator if you have any questions, and they will be happy to assist you.

## 2017-11-13 ENCOUNTER — Encounter: Payer: Self-pay | Admitting: Internal Medicine

## 2017-11-25 ENCOUNTER — Telehealth: Payer: Self-pay | Admitting: Internal Medicine

## 2017-11-25 ENCOUNTER — Other Ambulatory Visit: Payer: Self-pay

## 2017-11-25 MED ORDER — HYDROCORTISONE 2.5 % RE CREA: 1.0000 "application " | TOPICAL_CREAM | Freq: Three times a day (TID) | RECTAL | 1 refills | Status: DC

## 2017-11-25 NOTE — Telephone Encounter (Signed)
Spoke to pt.  Bleeding ceased with apc.  Only sx persisting is that of occ itching.  benefiber helping.  Lets Rx Anusol HCcream disp 1 unit ; apply a pea-sized amt to anorectum TID as needed.  1 refill

## 2017-11-25 NOTE — Telephone Encounter (Signed)
Noted rx sent.

## 2017-12-06 ENCOUNTER — Encounter (HOSPITAL_COMMUNITY): Payer: Self-pay | Admitting: Internal Medicine

## 2017-12-09 ENCOUNTER — Other Ambulatory Visit (HOSPITAL_COMMUNITY): Payer: Self-pay

## 2017-12-09 DIAGNOSIS — Z8546 Personal history of malignant neoplasm of prostate: Secondary | ICD-10-CM

## 2017-12-09 DIAGNOSIS — C9110 Chronic lymphocytic leukemia of B-cell type not having achieved remission: Secondary | ICD-10-CM

## 2017-12-09 DIAGNOSIS — E611 Iron deficiency: Secondary | ICD-10-CM

## 2017-12-09 DIAGNOSIS — C182 Malignant neoplasm of ascending colon: Secondary | ICD-10-CM

## 2017-12-12 ENCOUNTER — Inpatient Hospital Stay (HOSPITAL_COMMUNITY): Payer: Medicare Other | Attending: Hematology

## 2017-12-12 DIAGNOSIS — E611 Iron deficiency: Secondary | ICD-10-CM

## 2017-12-12 DIAGNOSIS — Z923 Personal history of irradiation: Secondary | ICD-10-CM | POA: Insufficient documentation

## 2017-12-12 DIAGNOSIS — Z8546 Personal history of malignant neoplasm of prostate: Secondary | ICD-10-CM | POA: Diagnosis not present

## 2017-12-12 DIAGNOSIS — C9110 Chronic lymphocytic leukemia of B-cell type not having achieved remission: Secondary | ICD-10-CM | POA: Diagnosis not present

## 2017-12-12 DIAGNOSIS — C182 Malignant neoplasm of ascending colon: Secondary | ICD-10-CM | POA: Diagnosis not present

## 2017-12-12 DIAGNOSIS — R3915 Urgency of urination: Secondary | ICD-10-CM | POA: Diagnosis not present

## 2017-12-12 LAB — COMPREHENSIVE METABOLIC PANEL
ALT: 20 U/L (ref 17–63)
ANION GAP: 9 (ref 5–15)
AST: 25 U/L (ref 15–41)
Albumin: 3.8 g/dL (ref 3.5–5.0)
Alkaline Phosphatase: 57 U/L (ref 38–126)
BUN: 13 mg/dL (ref 6–20)
CHLORIDE: 107 mmol/L (ref 101–111)
CO2: 24 mmol/L (ref 22–32)
CREATININE: 1.12 mg/dL (ref 0.61–1.24)
Calcium: 8.9 mg/dL (ref 8.9–10.3)
GFR calc Af Amer: >60 mL/min (ref >60)
GFR calc non Af Amer: >60 mL/min (ref >60)
Glucose, Bld: 129 mg/dL — ABNORMAL HIGH (ref 65–99)
POTASSIUM: 4.5 mmol/L (ref 3.5–5.1)
SODIUM: 140 mmol/L (ref 135–145)
Total Bilirubin: 0.8 mg/dL (ref 0.3–1.2)
Total Protein: 6.8 g/dL (ref 6.5–8.1)

## 2017-12-12 LAB — CBC WITH DIFFERENTIAL/PLATELET
BASOS PCT: 1 %
Basophils Absolute: 0.1 10*3/uL (ref 0.0–0.1)
EOS ABS: 0.6 10*3/uL (ref 0.0–0.7)
Eosinophils Relative: 4 %
HCT: 44.7 % (ref 39.0–52.0)
Hemoglobin: 14.6 g/dL (ref 13.0–17.0)
Lymphocytes Relative: 37 %
Lymphs Abs: 5.1 10*3/uL — ABNORMAL HIGH (ref 0.7–4.0)
MCH: 30.4 pg (ref 26.0–34.0)
MCHC: 32.7 g/dL (ref 30.0–36.0)
MCV: 93.1 fL (ref 78.0–100.0)
MONO ABS: 1.4 10*3/uL — AB (ref 0.1–1.0)
Monocytes Relative: 10 %
NEUTROS PCT: 48 %
Neutro Abs: 6.6 10*3/uL (ref 1.7–7.7)
PLATELETS: 319 10*3/uL (ref 150–400)
RBC: 4.8 MIL/uL (ref 4.22–5.81)
RDW: 12.7 % (ref 11.5–15.5)
WBC: 13.8 10*3/uL — AB (ref 4.0–10.5)

## 2017-12-12 LAB — PSA: Prostatic Specific Antigen: 0.14 ng/mL (ref 0.00–4.00)

## 2017-12-13 ENCOUNTER — Encounter (INDEPENDENT_AMBULATORY_CARE_PROVIDER_SITE_OTHER): Payer: TRICARE For Life (TFL) | Admitting: Ophthalmology

## 2017-12-13 LAB — CEA: CEA1: 1.7 ng/mL (ref 0.0–4.7)

## 2017-12-15 ENCOUNTER — Encounter (HOSPITAL_COMMUNITY): Payer: Self-pay | Admitting: Hematology

## 2017-12-15 ENCOUNTER — Inpatient Hospital Stay (HOSPITAL_COMMUNITY): Payer: Medicare Other | Attending: Hematology | Admitting: Hematology

## 2017-12-15 ENCOUNTER — Other Ambulatory Visit: Payer: Self-pay

## 2017-12-15 VITALS — BP 136/59 | HR 57 | Temp 98.3°F | Resp 20 | Wt 224.4 lb

## 2017-12-15 DIAGNOSIS — Z923 Personal history of irradiation: Secondary | ICD-10-CM | POA: Diagnosis not present

## 2017-12-15 DIAGNOSIS — R3915 Urgency of urination: Secondary | ICD-10-CM | POA: Insufficient documentation

## 2017-12-15 DIAGNOSIS — I10 Essential (primary) hypertension: Secondary | ICD-10-CM | POA: Insufficient documentation

## 2017-12-15 DIAGNOSIS — C9110 Chronic lymphocytic leukemia of B-cell type not having achieved remission: Secondary | ICD-10-CM

## 2017-12-15 DIAGNOSIS — F4310 Post-traumatic stress disorder, unspecified: Secondary | ICD-10-CM | POA: Insufficient documentation

## 2017-12-15 DIAGNOSIS — C61 Malignant neoplasm of prostate: Secondary | ICD-10-CM | POA: Insufficient documentation

## 2017-12-15 DIAGNOSIS — Z8546 Personal history of malignant neoplasm of prostate: Secondary | ICD-10-CM | POA: Diagnosis not present

## 2017-12-15 DIAGNOSIS — I252 Old myocardial infarction: Secondary | ICD-10-CM | POA: Insufficient documentation

## 2017-12-15 DIAGNOSIS — I2510 Atherosclerotic heart disease of native coronary artery without angina pectoris: Secondary | ICD-10-CM | POA: Diagnosis not present

## 2017-12-15 DIAGNOSIS — C182 Malignant neoplasm of ascending colon: Secondary | ICD-10-CM | POA: Diagnosis present

## 2017-12-15 NOTE — Progress Notes (Signed)
[place], [place] [id number]   CLINIC:  Medical Oncology/Hematology  PCP:  [name]., MD [place] [address] [phone]   REASON FOR VISIT:  Follow-up for colon cancer, CLL, prostate cancer.  CURRENT THERAPY: Observation.  BRIEF ONCOLOGIC HISTORY:    Cancer of ascending colon ([place])   02/29/2012 Procedure    laparoscopic assisted R colectomy      03/09/2012 Miscellaneous    Iron deficiency and folic acid deficiency      03/29/2012 Initial Diagnosis    Cancer of ascending colon, CEA on 01/19/2012 was WNL      08/02/2013 Procedure    Ileocolonoscopy with snare polypectomy      06/11/2014 Imaging    CT abdomen/pelvis with no findings to suggest metastatic disease. Diverticulosis      06/11/2016 Imaging    CT Abdomen/Pelvis with no findings to suggest recurrent or metastatic disease. Prostatomegaly.         CANCER STAGING: Cancer Staging Cancer of ascending colon ([place]) Staging form: Colon and Rectum, AJCC 7th Edition - Clinical: Stage IIIB (T3, N1c, M0) - Signed by Baird Cancer, PA on 05/02/2012    INTERVAL HISTORY:  Mr. Beeck 83 y.o. male returns for follow-up of his various cancers.  He reportedly underwent colonoscopy and upper endoscopy on 11/10/2017.  Denies any bleeding per rectum or melena.  Complains of urinary urgency for the last couple of weeks.  Denies any dysuria.  Denies any hospitalizations or infections.  Denies any fevers, night sweats or weight loss.  He is accompanied by his wife today.  REVIEW OF SYSTEMS:  Review of Systems  Constitutional: Negative.   Respiratory: Negative.   Cardiovascular: Negative.   Gastrointestinal: Negative.   Genitourinary: Positive for bladder incontinence.   Musculoskeletal: Negative.   Skin: Negative.   Neurological: Negative.   Hematological: Negative.   Psychiatric/Behavioral: Negative.      PAST MEDICAL/SURGICAL HISTORY:  Past  Medical History:  Diagnosis Date  . Abnormality of gait 12/18/2013  . Anemia    hx of anemia as a child   . Arthritis   . Biliary dyskinesia   . CAD (coronary artery disease) 2012   a. hx of stent x2 in ramus/OM and mid LAD in 2012  b. balloon angioplaty of OM  . CLL (chronic lymphocytic leukemia) ([place])   . Colon cancer ([place]) 12/2011   s/p right hemicolectomy, did not tolerate chemo  . Colon polyps    tubular adenomas  . Diverticula of colon 2010  . Exposure to [employer]   . Family history of prostate cancer   . Family history of stomach cancer   . Folic acid deficiency   . Gastroparesis 04/12/2013   borderline  . GERD (gastroesophageal reflux disease)   . Glaucoma   . Heart palpitations 10/2012  . Hiatal hernia 2010   tiny  . History of kidney stones   . HTN (hypertension)   . Iron deficiency 05/02/2012   followed by Dr. Tressie [name], iron infusions.   . Myocardial infarction ([place]) 2012  . Peripheral neuropathy   . Prostate cancer [facility]) 2010   s/p radiation, surgery  . PTSD (post-traumatic stress disorder)   . Radiation Feb 2011   treatment  . Radiation proctitis   . Rosacea    Past Surgical History:  Procedure Laterality Date  . CARDIAC CATHETERIZATION  02/13/2011   [facility], [facility] cardiology  . CATARACT EXTRACTION  2011  . CATARACT EXTRACTION W/PHACO Right 08/06/2013   Procedure: CATARACT EXTRACTION PHACO AND INTRAOCULAR LENS PLACEMENT (IOC);  Surgeon: Tonny Branch, MD;  Location: [facility] ORS;  Service: Ophthalmology;  Laterality: Right;  CDE:20.96  . CHOLECYSTECTOMY    . COLONOSCOPY  02/12/2004   Dr. Gala Romney- L side diverticula, inflammatory  colon polyps  . COLONOSCOPY  01/19/2012   RMR: Prominent changes involving the rectal mucosa consistent with radiation-induced proctitis. Multiple colonic  polyps removed as described above. Sigmoid Diverticulosis/ 1.5 x 2 cm relatively flat ulcerated lesion in cecum/path showed adenocarcinoma of the colon. descending colon polyp with  tubular adenoma and one fragment of focal high grade dysplasia.   . COLONOSCOPY  08/28/2012   KPT:[id number] proctitis. Colonic diverticulosis/Ulcerations at the surgical anastomosis  path: ulcerated colonic mucosa with prolapse changes, tubular adenoma.   . COLONOSCOPY N/A 08/02/2013   RMR: Colonic polyps -removed as described above. Colonic diverticulosis. Status post reight hemicolectomy. Radiation proctitis- asymptomatic.   Marland Kitchen COLONOSCOPY N/A 05/09/2015   Procedure: COLONOSCOPY;  Surgeon: Daneil Dolin, MD;  Location: [facility] ENDO SUITE;  Service: Endoscopy;  Laterality: N/A;  1200-moved to 1145 [place] to notify pt  . COLONOSCOPY WITH PROPOFOL N/A 11/10/2017   Procedure: COLONOSCOPY WITH PROPOFOL;  Surgeon: Daneil Dolin, MD;  Location: [facility] END[facility] SUITE;  Service: Endoscopy;  Laterality: N/A;  . CORONARY ANGIOPLASTY  06/17/14   OM1 POBA  . CORONARY STENT PLACEMENT  01/2011   2.5x59mm Xience drug-eluting stent in ramus intermedius/OMI vessel, 2.5x4mm Promus Element stent in mid LAD artery  . ESOPHAGOGASTRODUODENOSCOPY  10/10/2008   Dr. Tilda Burrow hiatal hernia, normal esophagus, normal stomach  . ESOPHAGOGASTRODUODENOSCOPY  02/12/2004   Dr. Dudley Major- normal   . ESOPHAGOGASTRODUODENOSCOPY N/A 05/09/2015   Procedure: ESOPHAGOGASTRODUODENOSCOPY (EGD);  Surgeon: Daneil Dolin, MD;  [facility];  Service: Endoscopy;  Laterality: N/A;  . ESOPHAGOGASTRODUODENOSCOPY (EGD) WITH PROPOFOL N/A 11/10/2017   Procedure: ESOPHAGOGASTRODUODENOSCOPY (EGD) WITH PROPOFOL;  Surgeon: Daneil Dolin, MD;  Location: [facility];  Service: Endoscopy;  Laterality: N/A;  9:45am  . GIVENS CAPSULE STUDY N/A 06/03/2015   Procedure: GIVENS CAPSULE STUDY;  Surgeon: Daneil Dolin, MD;  Location: [facility];  Service: Endoscopy;  Laterality: N/A;  0700  . HOT HEMOSTASIS  11/10/2017   Procedure: HOT HEMOSTASIS (ARGON PLASMA COAGULATION/BICAP);  Surgeon: Daneil Dolin, MD;  Location: [facility];  Service: Endoscopy;;   rectum  . KNEE SURGERY     left knee   . LEFT HEART CATHETERIZATION WITH CORONARY ANGIOGRAM N/A 06/17/2014   Procedure: LEFT HEART CATHETERIZATION WITH CORONARY ANGIOGRAM;  Surgeon: Burnell Blanks, MD;  Location: [facility] CATH LAB;  Service: Cardiovascular;  Laterality: N/A;  . PARTIAL COLECTOMY  02/29/2012   Procedure: PARTIAL COLECTOMY;  Surgeon: Adin Hector, MD;  Location: [facility]S;  Service: General;  Laterality: Right;  . POLYPECTOMY  11/10/2017   Procedure: POLYPECTOMY;  Surgeon: Daneil Dolin, MD;  Location: [facility];  Service: Endoscopy;;  gastric colon  . PROSTATECTOMY    . TONSILLECTOMY    . TRANSURETHRAL RESECTION OF PROSTATE    . VASECTOMY       SOCIAL HISTORY:  Social History   Socioeconomic History  . Marital status: Married    Spouse name: Not on file  . Number of children: 3  . 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 Highest education level: Not on file  Occupational History  . 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  Financial resource strain: Not on file  . Food insecurity:    Worry: Not on file    Inability: Not on file  . Transportation needs:    Medical: Not on file    Non-medical: Not on file  Tobacco Use  . Smoking status: Never Smoker  . Smokeless tobacco: Never Used  Substance and Sexual Activity  . Alcohol use: Yes    Comment: 1 cocktail/month  . Drug use: No  . Sexual activity: Yes    Birth control/protection: None  Lifestyle  . Physical activity:    Days per week: Not on file    Minutes per session: Not on file  . Stress: Not on file  Relationships  . Social connections:    Talks on phone: Not on file    Gets together: Not on file    Attends religious service: Not on file    Active member of club or organization: Not on file    Attends meetings of clubs or organizations: Not on file    Relationship status: Not on file  . Intimate partner violence:    Fear of current or ex partner: Not on file    Emotionally  abused: Not on file    Physically abused: Not on file    Forced sexual activity: Not on file  Other Topics Concern  . Not on file  Social History Narrative  . Not on file    FAMILY HISTORY:  Family History  Problem Relation Age of Onset  . Throat cancer Father        dx in his 74s and again in his 50s; pipe and cigar smoker  . Neuropathy Brother   . Prostate cancer Brother 50  . Melanoma Brother        dx in his 60s  . Cancer Paternal Uncle        smoking related cancer  . Heart attack Paternal Grandfather   . Stomach cancer Cousin 77  . Colon cancer Neg Hx     CURRENT MEDICATIONS:  Outpatient Encounter Medications as of 12/15/2017  Medication Sig Note  . ALPRAZolam (XANAX) 0.5 MG tablet Take 0.5 mg by mouth daily as needed for anxiety. 10/26/2017: Primarily uses this when fly  . aspirin EC 81 MG tablet Take 81 mg by mouth daily.   . B Complex Vitamins (VITAMIN B COMPLEX PO) Take 1 tablet by mouth at bedtime.    . cetirizine (ZYRTEC) 10 MG tablet Take 10 mg by mouth daily.   . cholecalciferol (VITAMIN D) 400 units TABS tablet Take 400 Units by mouth daily.   . citalopram (CELEXA) 20 MG tablet Take 20 mg by mouth daily.   . clopidogrel (PLAVIX) 75 MG tablet TAKE (1) TABLET BY MOUTH ONCE DAILY.   . folic acid (FOLVITE) 1 MG tablet TAKE (1) TABLET BY MOUTH ONCE DAILY .   Marland Kitchen hydrocortisone (ANUSOL-HC) 2.5 % rectal cream Place 1 application rectally 3 (three) times daily.   Marland Kitchen ibuprofen (ADVIL,MOTRIN) 200 MG tablet Take 400 mg by mouth daily as needed for headache or moderate pain.   Marland Kitchen latanoprost (XALATAN) 0.005 % ophthalmic solution Place 1 drop into both eyes at bedtime.    Marland Kitchen losartan (COZAAR) 50 MG tablet Take 25 mg by mouth 2 (two) times daily.    . metoprolol (LOPRESSOR) 50 MG tablet Take 50 mg by mouth 2 (two) times daily.    . montelukast (SINGULAIR) 10 MG tablet Take 10 mg by mouth at bedtime.   Marland Kitchen  Multiple Vitamin (MULTIVITAMIN WITH MINERALS) TABS tablet Take 1 tablet by  mouth daily.   . pantoprazole (PROTONIX) 40 MG tablet TAKE 2 TABLETS BY MOUTH ONCE DAILY. (Patient taking differently: Takes one tablet daily)   . pravastatin (PRAVACHOL) 40 MG tablet TAKE 1 TABLET BY MOUTH AT BEDTIME.   . Probiotic Product (PROBIOTIC DAILY PO) Take 1 capsule by mouth daily.   Marland Kitchen loperamide (IMODIUM) 2 MG capsule Take 2 mg by mouth as needed for diarrhea or loose stools.   Marland Kitchen NITROSTAT 0.4 MG SL tablet PLACE ONE (1) TABLET UNDER TONGUE EVERY 5 MINUTES UP TO (3) DOSES AS NEEDED FOR CHEST PAIN. (Patient not taking: Reported on 12/15/2017)   . [DISCONTINUED] Vitamin D, Ergocalciferol, (DRISDOL) 50000 units CAPS capsule TAKE 1 CAPSULE TWICE A WEEK (Patient not taking: Reported on 10/11/2017)    No facility-administered encounter medications on file as of 12/15/2017.     ALLERGIES:  Allergies  Allergen Reactions  . Shellfish Allergy Anaphylaxis  . Sulfonamide Derivatives Diarrhea and Nausea Only     PHYSICAL EXAM:  ECOG Performance status: 1  Vitals:   12/15/17 1540  BP: (!) 136/59  Pulse: (!) 57  Resp: 20  Temp: 98.3 F (36.8 C)  SpO2: 96%   Filed Weights   12/15/17 1540  Weight: 224 lb 6.4 oz (101.8 kg)    Physical Exam  Constitutional: He is oriented to person, place, and time. He appears well-developed and well-nourished.  Abdominal: Soft.  Nontender, no palpable masses.  Lymphadenopathy:  No palpable lymphadenopathy in the neck, axilla and inguinal region.  Neurological: He is alert and oriented to person, place, and time.  Psychiatric: He has a normal mood and affect. His behavior is normal. Judgment and thought content normal.     LABORATORY DATA:  I have reviewed the labs as listed.  CBC    Component Value Date/Time   WBC 13.8 (H) 12/12/2017 1206   RBC 4.80 12/12/2017 1206   HGB 14.6 12/12/2017 1206   HCT 44.7 12/12/2017 1206   PLT 319 12/12/2017 1206   MCV 93.1 12/12/2017 1206   MCH 30.4 12/12/2017 1206   MCHC 32.7 12/12/2017 1206   RDW 12.7  12/12/2017 1206   LYMPHSABS 5.1 (H) 12/12/2017 1206   MONOABS 1.4 (H) 12/12/2017 1206   EOSABS 0.6 12/12/2017 1206   BASOSABS 0.1 12/12/2017 1206   CMP Latest Ref Rng & Units 12/12/2017 11/03/2017 06/09/2017  Glucose 65 - 99 mg/dL 129(H) 102(H) 137(H)  BUN 6 - 20 mg/dL 13 14 16   Creatinine 0.61 - 1.24 mg/dL 1.12 1.11 1.26(H)  Sodium 135 - 145 mmol/L 140 141 141  Potassium 3.5 - 5.1 mmol/L 4.5 4.0 4.2  Chloride 101 - 111 mmol/L 107 107 106  CO2 22 - 32 mmol/L 24 25 26   Calcium 8.9 - 10.3 mg/dL 8.9 8.8(L) 8.9  Total Protein 6.5 - 8.1 g/dL 6.8 - 6.7  Total Bilirubin 0.3 - 1.2 mg/dL 0.8 - 0.9  Alkaline Phos 38 - 126 U/L 57 - 56  AST 15 - 41 U/L 25 - 24  ALT 17 - 63 U/L 20 - 20        ASSESSMENT & PLAN:   Cancer of ascending colon ([facility]) 1.  Stage III ascending colon cancer (T3N1C) 2 /20 lymph nodes positive: -Status post right hemicolectomy on 02/29/2012, could not tolerate adjuvant Xeloda -Colonoscopy on 11/10/2017 showing a tubular adenoma and radiation proctitis and diverticulosis -EGD shows multiple benign gastric polyps, otherwise within normal limits -As he  completed 5 years of surveillance, no more scans are being ordered.  Most recent CEA is 1.7.  2.  Prostate cancer: -Underwent radiation therapy at [facility] in 2011.  Also underwent TURP x2. - Recent PSA 0.14. -He is having urgency symptoms in the last 2 weeks.  I have told him to contact Dr. Juel Burrow (urologist) office in [facility].  3.  Stage 0 CLL: -Diagnosed by flow on 12/13/2014 -No palpable lymphadenopathy or splenomegaly, no cytopenias, no B symptoms -White count is 13.8, absolute leukocyte count of 5100.  We will see him back in 6 months with repeat blood work.      Orders placed this encounter:  Orders Placed This Encounter  Procedures  . CBC with Differential  . Comprehensive metabolic panel  . CEA  . PSA      Derek Jack, MD [place] [phone]

## 2017-12-15 NOTE — Patient Instructions (Signed)
[facility] Discharge Instructions  Today you saw Dr. Katragadda   Thank you for choosing [facility] at [facility] to provide your oncology and hematology care.  To afford each patient quality time with our provider, please arrive at least 15 minutes before your scheduled appointment time.   If you have a lab appointment with the Cancer Center please come in thru the  Main Entrance and check in at the main information desk  You need to re-schedule your appointment should you arrive 10 or more minutes late.  We strive to give you quality time with our providers, and arriving late affects you and other patients whose appointments are after yours.  Also, if you no show three or more times for appointments you may be dismissed from the clinic at the providers discretion.     Again, thank you for choosing [facility].  Our hope is that these requests will decrease the amount of time that you wait before being seen by our physicians.       _____________________________________________________________  Should you have questions after your visit to [facility], please contact our office at [phone] between the hours of 8:30 a.m. and 4:30 p.m.  Voicemails left after 4:30 p.m. will not be returned until the following business day.  For prescription refill requests, have your pharmacy contact our office.       Resources For Cancer Patients and their Caregivers ? American Cancer Society: Can assist with transportation, wigs, general needs, runs Look Good Feel Better.        1-888-227-6333 ? Cancer Care: Provides financial assistance, online support groups, medication/co-pay assistance.  1-800-813-HOPE (4673) ? [facility] Assists [place] cancer patients and their families through emotional , educational and financial support.  336-427-4357 ? [place] DSS Where to apply for  food stamps, Medicaid and utility assistance. 336-342-1394 ? [facility]: Transportation to medical appointments. 336-347-2287 ? Social Security Administration: May apply for disability if have a Stage IV cancer. 336-342-7796 1-800-772-1213 ? [place] Aging, Disability and Transit Services: Assists with nutrition, care and transit needs. 336-349-2343  Cancer Center Support Programs:   > Cancer Support Group  2nd Tuesday of the month 1pm-2pm, Journey Room   > Creative Journey  3rd Tuesday of the month 1130am-1pm, Journey Room    

## 2017-12-15 NOTE — Assessment & Plan Note (Signed)
1.  Stage III ascending colon cancer (T3N1C) 2 /20 lymph nodes positive: -Status post right hemicolectomy on 02/29/2012, could not tolerate adjuvant Xeloda -Colonoscopy on 11/10/2017 showing a tubular adenoma and radiation proctitis and diverticulosis -EGD shows multiple benign gastric polyps, otherwise within normal limits -As he completed 5 years of surveillance, no more scans are being ordered.  Most recent CEA is 1.7.  2.  Prostate cancer: -Underwent radiation therapy at [facility] in 2011.  Also underwent TURP x2. - Recent PSA 0.14. -He is having urgency symptoms in the last 2 weeks.  I have told him to contact Dr. Juel Burrow (urologist) office in [facility].  3.  Stage 0 CLL: -Diagnosed by flow on 12/13/2014 -No palpable lymphadenopathy or splenomegaly, no cytopenias, no B symptoms -White count is 13.8, absolute leukocyte count of 5100.  We will see him back in 6 months with repeat blood work.

## 2017-12-20 ENCOUNTER — Other Ambulatory Visit: Payer: Self-pay | Admitting: Cardiovascular Disease

## 2017-12-26 ENCOUNTER — Encounter: Payer: Self-pay | Admitting: Internal Medicine

## 2018-04-18 ENCOUNTER — Ambulatory Visit (INDEPENDENT_AMBULATORY_CARE_PROVIDER_SITE_OTHER): Payer: Medicare Other | Admitting: Internal Medicine

## 2018-04-18 ENCOUNTER — Encounter: Payer: Self-pay | Admitting: Internal Medicine

## 2018-04-18 VITALS — BP 136/77 | HR 64 | Temp 97.4°F | Ht 71.0 in | Wt 224.0 lb

## 2018-04-18 DIAGNOSIS — K627 Radiation proctitis: Secondary | ICD-10-CM

## 2018-04-18 DIAGNOSIS — K219 Gastro-esophageal reflux disease without esophagitis: Secondary | ICD-10-CM | POA: Diagnosis not present

## 2018-04-18 NOTE — Progress Notes (Signed)
Primary Care Physician:  [name]., MD Primary Gastroenterologist:  Dr. Gala Romney  Pre-Procedure History & Physical: HPI:  David Ledford. is a 83 y.o. male here for for follow-up. History of rectal bleeding status post colonoscopy earlier this year with removal of a small adenoma and APC treatment of radiation proctitis. He sees. Status post right hemicolectomy for colorectal cancer previously without evidence of recurrence. The CEA and PSA normal earlier this year.  Reflux symptoms well controlled on Protonix 40 mg daily.  Past Medical History:  Diagnosis Date  . Abnormality of gait 12/18/2013  . Anemia    hx of anemia as a child   . Arthritis   . Biliary dyskinesia   . CAD (coronary artery disease) 2012   a. hx of stent x2 in ramus/OM and mid LAD in 2012  b. balloon angioplaty of OM  . CLL (chronic lymphocytic leukemia) ([place])   . Colon cancer ([place]) 12/2011   s/p right hemicolectomy, did not tolerate chemo  . Colon polyps    tubular adenomas  . Diverticula of colon 2010  . Exposure to [employer]   . Family history of prostate cancer   . Family history of stomach cancer   . Folic acid deficiency   . Gastroparesis 04/12/2013   borderline  . GERD (gastroesophageal reflux disease)   . Glaucoma   . Heart palpitations 10/2012  . Hiatal hernia 2010   tiny  . History of kidney stones   . HTN (hypertension)   . Iron deficiency 05/02/2012   followed by Dr. Tressie [name], iron infusions.   . Myocardial infarction ([place]) 2012  . Peripheral neuropathy   . Prostate cancer [facility]) 2010   s/p radiation, surgery  . PTSD (post-traumatic stress disorder)   . Radiation Feb 2011   treatment  . Radiation proctitis   . Rosacea     Past Surgical History:  Procedure Laterality Date  . CARDIAC CATHETERIZATION  02/13/2011   [facility], [facility] cardiology  . CATARACT EXTRACTION     2011  . CATARACT EXTRACTION W/PHACO Right 08/06/2013   Procedure: CATARACT EXTRACTION PHACO AND  INTRAOCULAR LENS PLACEMENT (IOC);  Surgeon: Tonny Branch, MD;  Location: [facility] ORS;  Service: Ophthalmology;  Laterality: Right;  CDE:20.96  . CHOLECYSTECTOMY    . COLONOSCOPY  02/12/2004   Dr. Gala Romney- L side diverticula, inflammatory  colon polyps  . COLONOSCOPY  01/19/2012   RMR: Prominent changes involving the rectal mucosa consistent with radiation-induced proctitis. Multiple colonic  polyps removed as described above. Sigmoid Diverticulosis/ 1.5 x 2 cm relatively flat ulcerated lesion in cecum/path showed adenocarcinoma of the colon. descending colon polyp with tubular adenoma and one fragment of focal high grade dysplasia.   . COLONOSCOPY  08/28/2012   QQI:[id number] proctitis. Colonic diverticulosis/Ulcerations at the surgical anastomosis  path: ulcerated colonic mucosa with prolapse changes, tubular adenoma.   . COLONOSCOPY N/A 08/02/2013   RMR: Colonic polyps -removed as described above. Colonic diverticulosis. Status post reight hemicolectomy. Radiation proctitis- asymptomatic.   Marland Kitchen COLONOSCOPY N/A 05/09/2015   Procedure: COLONOSCOPY;  Surgeon: Daneil Dolin, MD;  Location: [facility];  Service: Endoscopy;  Laterality: N/A;  1200-moved to [address]5 [address] to notify pt  . COLONOSCOPY WITH PROPOFOL N/A 11/10/2017   Procedure: COLONOSCOPY WITH PROPOFOL;  Surgeon: Daneil Dolin, MD;  Location: [facility];  Service: Endoscopy;  Laterality: N/A;  . CORONARY ANGIOPLASTY  06/17/14   OM1 POBA  . CORONARY STENT PLACEMENT  01/2011   2.5x18mm  Xience drug-eluting stent in ramus intermedius/OMI vessel, 2.5x80mm Promus Element stent in mid LAD artery  . ESOPHAGOGASTRODUODENOSCOPY  10/10/2008   Dr. Tilda Burrow hiatal hernia, normal esophagus, normal stomach  . ESOPHAGOGASTRODUODENOSCOPY  02/12/2004   Dr. Dudley Major- normal   . ESOPHAGOGASTRODUODENOSCOPY N/A 05/09/2015   Procedure: ESOPHAGOGASTRODUODENOSCOPY (EGD);  Surgeon: Daneil Dolin, MD;  [facility];  Service: Endoscopy;  Laterality: N/A;  .  ESOPHAGOGASTRODUODENOSCOPY (EGD) WITH PROPOFOL N/A 11/10/2017   Procedure: ESOPHAGOGASTRODUODENOSCOPY (EGD) WITH PROPOFOL;  Surgeon: Daneil Dolin, MD;  Location: [facility];  Service: Endoscopy;  Laterality: N/A;  9:45am  . GIVENS CAPSULE STUDY N/A 06/03/2015   Procedure: GIVENS CAPSULE STUDY;  Surgeon: Daneil Dolin, MD;  Location: [facility];  Service: Endoscopy;  Laterality: N/A;  0700  . HOT HEMOSTASIS  11/10/2017   Procedure: HOT HEMOSTASIS (ARGON PLASMA COAGULATION/BICAP);  Surgeon: Daneil Dolin, MD;  Location: [facility];  Service: Endoscopy;;  rectum  . KNEE SURGERY     left knee   . LEFT HEART CATHETERIZATION WITH CORONARY ANGIOGRAM N/A 06/17/2014   Procedure: LEFT HEART CATHETERIZATION WITH CORONARY ANGIOGRAM;  Surgeon: Burnell Blanks, MD;  Location: [facility] CATH LAB;  Service: Cardiovascular;  Laterality: N/A;  . PARTIAL COLECTOMY  02/29/2012   Procedure: PARTIAL COLECTOMY;  Surgeon: Adin Hector, MD;  Location: [facility];  Service: General;  Laterality: Right;  . POLYPECTOMY  11/10/2017   Procedure: POLYPECTOMY;  Surgeon: Daneil Dolin, MD;  Location: [facility];  Service: Endoscopy;;  gastric colon  . PROSTATECTOMY    . TONSILLECTOMY    . TRANSURETHRAL RESECTION OF PROSTATE    . VASECTOMY      Prior to Admission medications   Medication Sig Start Date End Date Taking? Authorizing Provider  ALPRAZolam Duanne Moron) 0.5 MG tablet Take 0.5 mg by mouth daily as needed for anxiety.   Yes [provider]  aspirin EC 81 MG tablet Take 81 mg by mouth daily.   Yes [provider]  B Complex Vitamins (VITAMIN B COMPLEX PO) Take 1 tablet by mouth at bedtime.    Yes [provider]  cetirizine (ZYRTEC) 10 MG tablet Take 10 mg by mouth daily.   Yes [provider]  cholecalciferol (VITAMIN D) 400 units TABS tablet Take 400 Units by mouth daily.   Yes [provider]  citalopram (CELEXA) 20 MG tablet Take 20 mg by mouth daily.   Yes  [provider]  clopidogrel (PLAVIX) 75 MG tablet TAKE (1) TABLET BY MOUTH ONCE DAILY. 05/03/17  Yes Croitoru, Mihai, MD  folic acid (FOLVITE) 1 MG tablet TAKE (1) TABLET BY MOUTH ONCE DAILY . 07/13/17  Yes Holley Bouche, NP  hydrocortisone (ANUSOL-HC) 2.5 % rectal cream Place 1 application rectally 3 (three) times daily. Patient taking differently: Place 1 application rectally as needed.  11/25/17  Yes Christee Mervine, Cristopher Estimable, MD  ibuprofen (ADVIL,MOTRIN) 200 MG tablet Take 400 mg by mouth daily as needed for headache or moderate pain.   Yes [provider]  latanoprost (XALATAN) 0.005 % ophthalmic solution Place 1 drop into both eyes at bedtime.    Yes [provider]  loperamide (IMODIUM) 2 MG capsule Take 2 mg by mouth as needed for diarrhea or loose stools.   Yes [provider]  losartan (COZAAR) 50 MG tablet Take 25 mg by mouth 2 (two) times daily.    Yes [provider]  metoprolol (LOPRESSOR) 50 MG tablet Take 50 mg  by mouth 2 (two) times daily.    Yes [provider]  montelukast (SINGULAIR) 10 MG tablet Take 10 mg by mouth at bedtime.   Yes [provider]  Multiple Vitamin (MULTIVITAMIN WITH MINERALS) TABS tablet Take 1 tablet by mouth daily.   Yes [provider]  NITROSTAT 0.4 MG SL tablet PLACE ONE (1) TABLET UNDER TONGUE EVERY 5 MINUTES UP TO (3) DOSES AS NEEDED FOR CHEST PAIN. 12/20/17  Yes Croitoru, Mihai, MD  pantoprazole (PROTONIX) 40 MG tablet TAKE 2 TABLETS BY MOUTH ONCE DAILY. Patient taking differently: Takes one tablet daily 09/19/14  Yes Croitoru, Mihai, MD  pravastatin (PRAVACHOL) 40 MG tablet TAKE 1 TABLET BY MOUTH AT BEDTIME. 09/13/17  Yes Croitoru, Mihai, MD  Probiotic Product (PROBIOTIC DAILY PO) Take 1 capsule by mouth daily.   Yes [provider]  Wheat Dextrin (BENEFIBER) POWD Take by mouth. Daily in the AM   Yes [provider]    Allergies as of 04/18/2018 - Review Complete  04/18/2018  Allergen Reaction Noted  . Shellfish allergy Anaphylaxis 01/13/2012  . Sulfonamide derivatives Diarrhea and Nausea Only 04/30/2009    Family History  Problem Relation Age of Onset  . Throat cancer Father        dx in his 22s and again in his 35s; pipe and cigar smoker  . Neuropathy Brother   . Prostate cancer Brother 64  . Melanoma Brother        dx in his 72s  . Cancer Paternal Uncle        smoking related cancer  . Heart attack Paternal Grandfather   . Stomach cancer Cousin 66  . Colon cancer Neg Hx     Social History   Socioeconomic History  . Marital status: Married    Spouse name: Not on file  . Number of children: 3  . 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 Highest education level: Not on file  Occupational History  . 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 Financial resource strain: Not on file  . Food insecurity:    Worry: Not on file    Inability: Not on file  . Transportation needs:    Medical: Not on file    Non-medical: Not on file  Tobacco Use  . Smoking status: Never Smoker  . Smokeless tobacco: Never Used  Substance and Sexual Activity  . Alcohol use: Yes    Comment: 1 cocktail/month  . Drug use: No  . Sexual activity: Yes    Birth control/protection: None  Lifestyle  . Physical activity:    Days per week: Not on file    Minutes per session: Not on file  . Stress: Not on file  Relationships  . Social connections:    Talks on phone: Not on file    Gets together: Not on file    Attends religious service: Not on file    Active member of club or organization: Not on file    Attends meetings of clubs or organizations: Not on file    Relationship status: Not on file  . Intimate partner violence:    Fear of current or ex partner: Not on file    Emotionally abused: Not on file    Physically abused: Not on file    Forced sexual activity: Not on file  Other Topics Concern  . Not on file  Social History Narrative  . Not  on file    Review of Systems:  See HPI, otherwise negative ROS  Physical Exam: BP 136/77   Pulse 64   Temp (!) 97.4 F (36.3 C) (Oral)   Ht 5\' 11"  (1.803 m)   Wt 224 lb (101.6 kg)   BMI 31.24 kg/m  General:   Alert,  Well-developed, well-nourished, pleasant and cooperative in NAD Neck:  Supple; no masses or thyromegaly. No significant cervical adenopathy. Lungs:  Clear throughout to auscultation.   No wheezes, crackles, or rhonchi. No acute distress. Heart:  Regular rate and rhythm; no murmurs, clicks, rubs,  or gallops. Abdomen: Non-distended, normal bowel sounds.  Soft and nontender without appreciable mass or hepatosplenomegaly.  Pulses:  Normal pulses noted. Extremities:  Without clubbing or edema.  Impression/Plan:  All in all, this very pleasant 83 year old Caucasian male [employer] veteran is doing very well given all his comorbidities. In particular, from a GI standpoint he is doing extremely well.  Recommendations:  Continue Protonix 40 mg daily  Continue Benefiber as directed   As discussed, no future EGD or colonoscopy unless new symptoms develop.  Office visit here in 1 year          Notice: This dictation was prepared with Dragon dictation along with smaller phrase technology. Any transcriptional errors that result from this process are unintentional and may not be corrected upon review.

## 2018-04-18 NOTE — Patient Instructions (Addendum)
Continue Protonix 40 mg daily  Continue Benefiber as directed   As discussed, no future EGD or colonoscopy unless new symptoms develop.  Office visit here in 1 year

## 2018-05-01 ENCOUNTER — Other Ambulatory Visit: Payer: Self-pay | Admitting: Cardiovascular Disease

## 2018-05-03 NOTE — Telephone Encounter (Signed)
Rx request sent to pharmacy.  

## 2018-05-19 ENCOUNTER — Other Ambulatory Visit (HOSPITAL_COMMUNITY): Payer: Self-pay | Admitting: Oncology

## 2018-05-26 ENCOUNTER — Other Ambulatory Visit: Payer: Self-pay | Admitting: Cardiovascular Disease

## 2018-06-04 ENCOUNTER — Other Ambulatory Visit: Payer: Self-pay | Admitting: Cardiovascular Disease

## 2018-06-05 NOTE — Telephone Encounter (Signed)
Rx request sent to pharmacy.  

## 2018-06-06 ENCOUNTER — Inpatient Hospital Stay (HOSPITAL_COMMUNITY): Payer: Medicare Other | Attending: Hematology

## 2018-06-06 DIAGNOSIS — Z79899 Other long term (current) drug therapy: Secondary | ICD-10-CM | POA: Diagnosis not present

## 2018-06-06 DIAGNOSIS — Z85038 Personal history of other malignant neoplasm of large intestine: Secondary | ICD-10-CM | POA: Diagnosis not present

## 2018-06-06 DIAGNOSIS — C182 Malignant neoplasm of ascending colon: Secondary | ICD-10-CM

## 2018-06-06 DIAGNOSIS — Z923 Personal history of irradiation: Secondary | ICD-10-CM | POA: Diagnosis not present

## 2018-06-06 DIAGNOSIS — I10 Essential (primary) hypertension: Secondary | ICD-10-CM | POA: Insufficient documentation

## 2018-06-06 DIAGNOSIS — C61 Malignant neoplasm of prostate: Secondary | ICD-10-CM | POA: Insufficient documentation

## 2018-06-06 DIAGNOSIS — Z9049 Acquired absence of other specified parts of digestive tract: Secondary | ICD-10-CM | POA: Diagnosis not present

## 2018-06-06 DIAGNOSIS — C9110 Chronic lymphocytic leukemia of B-cell type not having achieved remission: Secondary | ICD-10-CM | POA: Insufficient documentation

## 2018-06-06 DIAGNOSIS — Z7982 Long term (current) use of aspirin: Secondary | ICD-10-CM | POA: Insufficient documentation

## 2018-06-06 DIAGNOSIS — Z8546 Personal history of malignant neoplasm of prostate: Secondary | ICD-10-CM

## 2018-06-06 LAB — COMPREHENSIVE METABOLIC PANEL
ALBUMIN: 4 g/dL (ref 3.5–5.0)
ALK PHOS: 58 U/L (ref 38–126)
ALT: 21 U/L (ref 0–44)
AST: 19 U/L (ref 15–41)
Anion gap: 7 (ref 5–15)
BUN: 18 mg/dL (ref 8–23)
CALCIUM: 9.2 mg/dL (ref 8.9–10.3)
CO2: 26 mmol/L (ref 22–32)
Chloride: 109 mmol/L (ref 98–111)
Creatinine, Ser: 1.41 mg/dL — ABNORMAL HIGH (ref 0.61–1.24)
GFR calc Af Amer: 52 mL/min — ABNORMAL LOW (ref >60)
GFR, EST NON AFRICAN AMERICAN: 45 mL/min — AB (ref >60)
GLUCOSE: 96 mg/dL (ref 70–99)
Potassium: 4.4 mmol/L (ref 3.5–5.1)
Sodium: 142 mmol/L (ref 135–145)
TOTAL PROTEIN: 7 g/dL (ref 6.5–8.1)
Total Bilirubin: 1 mg/dL (ref 0.3–1.2)

## 2018-06-06 LAB — CBC WITH DIFFERENTIAL/PLATELET
ABS IMMATURE GRANULOCYTES: 0.14 10*3/uL — AB (ref 0.00–0.07)
Basophils Absolute: 0.2 10*3/uL — ABNORMAL HIGH (ref 0.0–0.1)
Basophils Relative: 1 %
EOS ABS: 0.3 10*3/uL (ref 0.0–0.5)
EOS PCT: 2 %
HCT: 48.3 % (ref 39.0–52.0)
HEMOGLOBIN: 15.3 g/dL (ref 13.0–17.0)
IMMATURE GRANULOCYTES: 1 %
LYMPHS ABS: 5.4 10*3/uL — AB (ref 0.7–4.0)
Lymphocytes Relative: 32 %
MCH: 30.5 pg (ref 26.0–34.0)
MCHC: 31.7 g/dL (ref 30.0–36.0)
MCV: 96.2 fL (ref 80.0–100.0)
MONO ABS: 1.3 10*3/uL — AB (ref 0.1–1.0)
Monocytes Relative: 8 %
Neutro Abs: 9.6 10*3/uL — ABNORMAL HIGH (ref 1.7–7.7)
Neutrophils Relative %: 57 %
Platelets: 371 10*3/uL (ref 150–400)
RBC: 5.02 MIL/uL (ref 4.22–5.81)
RDW: 12.8 % (ref 11.5–15.5)
WBC: 16.8 10*3/uL — ABNORMAL HIGH (ref 4.0–10.5)
nRBC: 0 % (ref 0.0–0.2)

## 2018-06-07 LAB — PSA: Prostatic Specific Antigen: 0.17 ng/mL (ref 0.00–4.00)

## 2018-06-07 LAB — CEA: CEA1: 2.1 ng/mL (ref 0.0–4.7)

## 2018-06-13 ENCOUNTER — Other Ambulatory Visit: Payer: Self-pay

## 2018-06-13 ENCOUNTER — Inpatient Hospital Stay (HOSPITAL_BASED_OUTPATIENT_CLINIC_OR_DEPARTMENT_OTHER): Payer: Medicare Other | Admitting: Hematology

## 2018-06-13 ENCOUNTER — Encounter (HOSPITAL_COMMUNITY): Payer: Self-pay | Admitting: Hematology

## 2018-06-13 DIAGNOSIS — Z923 Personal history of irradiation: Secondary | ICD-10-CM

## 2018-06-13 DIAGNOSIS — C61 Malignant neoplasm of prostate: Secondary | ICD-10-CM | POA: Diagnosis not present

## 2018-06-13 DIAGNOSIS — Z9049 Acquired absence of other specified parts of digestive tract: Secondary | ICD-10-CM | POA: Diagnosis not present

## 2018-06-13 DIAGNOSIS — C9110 Chronic lymphocytic leukemia of B-cell type not having achieved remission: Secondary | ICD-10-CM | POA: Diagnosis not present

## 2018-06-13 DIAGNOSIS — Z7982 Long term (current) use of aspirin: Secondary | ICD-10-CM

## 2018-06-13 DIAGNOSIS — Z85038 Personal history of other malignant neoplasm of large intestine: Secondary | ICD-10-CM

## 2018-06-13 DIAGNOSIS — I10 Essential (primary) hypertension: Secondary | ICD-10-CM

## 2018-06-13 DIAGNOSIS — Z79899 Other long term (current) drug therapy: Secondary | ICD-10-CM

## 2018-06-13 NOTE — Patient Instructions (Signed)
[facility] at [facility] Discharge Instructions  Follow up in 6 months with labs prior to your visit.   Thank you for choosing [facility] at [facility] to provide your oncology and hematology care.  To afford each patient quality time with our provider, please arrive at least 15 minutes before your scheduled appointment time.   If you have a lab appointment with the Cancer Center please come in thru the  Main Entrance and check in at the main information desk  You need to re-schedule your appointment should you arrive 10 or more minutes late.  We strive to give you quality time with our providers, and arriving late affects you and other patients whose appointments are after yours.  Also, if you no show three or more times for appointments you may be dismissed from the clinic at the providers discretion.     Again, thank you for choosing [facility].  Our hope is that these requests will decrease the amount of time that you wait before being seen by our physicians.       _____________________________________________________________  Should you have questions after your visit to [facility], please contact our office at [phone] between the hours of 8:00 a.m. and 4:30 p.m.  Voicemails left after 4:00 p.m. will not be returned until the following business day.  For prescription refill requests, have your pharmacy contact our office and allow 72 hours.    Cancer Center Support Programs:   > Cancer Support Group  2nd Tuesday of the month 1pm-2pm, Journey Room    

## 2018-06-13 NOTE — Progress Notes (Signed)
David Pugh, [place] [address]   CLINIC:  Medical Oncology/Hematology  PCP:  [name]., MD [place] [address] [phone]   REASON FOR VISIT: Follow-up for colon cancer, CLL, and prostate cancer  CURRENT THERAPY: Observation  BRIEF ONCOLOGIC HISTORY:    Cancer of ascending colon ([place])   02/29/2012 Procedure    laparoscopic assisted R colectomy    03/09/2012 Miscellaneous    Iron deficiency and folic acid deficiency    03/29/2012 Initial Diagnosis    Cancer of ascending colon, CEA on 01/19/2012 was WNL    08/02/2013 Procedure    Ileocolonoscopy with snare polypectomy    06/11/2014 Imaging    CT abdomen/pelvis with no findings to suggest metastatic disease. Diverticulosis    06/11/2016 Imaging    CT Abdomen/Pelvis with no findings to suggest recurrent or metastatic disease. Prostatomegaly.       CANCER STAGING: Cancer Staging Cancer of ascending colon ([place]) Staging form: Colon and Rectum, AJCC 7th Edition - Clinical: Stage IIIB (T3, N1c, M0) - Signed by Baird Cancer, PA on 05/02/2012    INTERVAL HISTORY:  David Pugh 83 y.o. male returns for routine follow-up . He is here today with his wife. He has been taking benifiber which is helping with his constipation. He denies any bleeding or dark stools. He has numbness in his feet however it is stable at this time. He reports his appetite at 100% and his energy level at 75%. He denies any new pain. Denies any easy bruising or bleeding. Denies any headaches or vision changes. Denies any fevers, night sweats, chills, or weight loss.     REVIEW OF SYSTEMS:  Review of Systems  Neurological: Positive for numbness (feet).  All other systems reviewed and are negative.    PAST MEDICAL/SURGICAL HISTORY:  Past Medical History:  Diagnosis Date  . Abnormality of gait 12/18/2013  . Anemia    hx of anemia as a child   . Arthritis   . Biliary dyskinesia   .  CAD (coronary artery disease) 2012   a. hx of stent x2 in ramus/OM and mid LAD in 2012  b. balloon angioplaty of OM  . CLL (chronic lymphocytic leukemia) ([place])   . Colon cancer ([place]) 12/2011   s/p right hemicolectomy, did not tolerate chemo  . Colon polyps    tubular adenomas  . Diverticula of colon 2010  . Exposure to [employer]   . Family history of prostate cancer   . Family history of stomach cancer   . Folic acid deficiency   . Gastroparesis 04/12/2013   borderline  . GERD (gastroesophageal reflux disease)   . Glaucoma   . Heart palpitations 10/2012  . Hiatal hernia 2010   tiny  . History of kidney stones   . HTN (hypertension)   . Iron deficiency 05/02/2012   followed by Dr. Tressie [name], iron infusions.   . Myocardial infarction ([place]) 2012  . Peripheral neuropathy   . Prostate cancer [facility]) 2010   s/p radiation, surgery  . PTSD (post-traumatic stress disorder)   . Radiation Feb 2011   treatment  . Radiation proctitis   . Rosacea    Past Surgical History:  Procedure Laterality Date  . CARDIAC CATHETERIZATION  02/13/2011   [facility], [facility] cardiology  . CATARACT EXTRACTION     2011  . CATARACT EXTRACTION W/PHACO Right 08/06/2013   Procedure: CATARACT EXTRACTION PHACO AND INTRAOCULAR LENS PLACEMENT (IOC);  Surgeon:  Tonny Branch, MD;  Location: [facility] ORS;  Service: Ophthalmology;  Laterality: Right;  CDE:20.96  . CHOLECYSTECTOMY    . COLONOSCOPY  02/12/2004   Dr. Gala Romney- L side diverticula, inflammatory  colon polyps  . COLONOSCOPY  01/19/2012   RMR: Prominent changes involving the rectal mucosa consistent with radiation-induced proctitis. Multiple colonic  polyps removed as described above. Sigmoid Diverticulosis/ 1.5 x 2 cm relatively flat ulcerated lesion in cecum/path showed adenocarcinoma of the colon. descending colon polyp with tubular adenoma and one fragment of focal high grade dysplasia.   . COLONOSCOPY  08/28/2012   UXN:[id number] proctitis. Colonic  diverticulosis/Ulcerations at the surgical anastomosis  path: ulcerated colonic mucosa with prolapse changes, tubular adenoma.   . COLONOSCOPY N/A 08/02/2013   RMR: Colonic polyps -removed as described above. Colonic diverticulosis. Status post reight hemicolectomy. Radiation proctitis- asymptomatic.   Marland Kitchen COLONOSCOPY N/A 05/09/2015   Procedure: COLONOSCOPY;  Surgeon: Daneil Dolin, MD;  Location: [facility] ENDO SU[facility];  Service: Endoscopy;  Laterality: N/A;  1200-moved to 1145 [place] to notify pt  . COLONOSCOPY WITH PROPOFOL N/A 11/10/2017   Procedure: COLONOSCOPY WITH PROPOFOL;  Surgeon: Daneil Dolin, MD;  Location: [facility] END[facility];  Service: Endoscopy;  Laterality: N/A;  . CORONARY ANGIOPLASTY  06/17/14   OM1 POBA  . CORONARY STENT PLACEMENT  01/2011   2.5x78mm Xience drug-eluting stent in ramus intermedius/OMI vessel, 2.5x20mm Promus Element stent in mid LAD artery  . ESOPHAGOGASTRODUODENOSCOPY  10/10/2008   Dr. Tilda Burrow hiatal hernia, normal esophagus, normal stomach  . ESOPHAGOGASTRODUODENOSCOPY  02/12/2004   Dr. Dudley Major- normal   . ESOPHAGOGASTRODUODENOSCOPY N/A 05/09/2015   Procedure: ESOPHAGOGASTRODUODENOSCOPY (EGD);  Surgeon: Daneil Dolin, MD;  [facility];  Service: Endoscopy;  Laterality: N/A;  . ESOPHAGOGASTRODUODENOSCOPY (EGD) WITH PROPOFOL N/A 11/10/2017   Procedure: ESOPHAGOGASTRODUODENOSCOPY (EGD) WITH PROPOFOL;  Surgeon: Daneil Dolin, MD;  Location: [facility];  Service: Endoscopy;  Laterality: N/A;  9:45am  . GIVENS CAPSULE STUDY N/A 06/03/2015   Procedure: GIVENS CAPSULE STUDY;  Surgeon: Daneil Dolin, MD;  Location: [facility];  Service: Endoscopy;  Laterality: N/A;  0700  . HOT HEMOSTASIS  11/10/2017   Procedure: HOT HEMOSTASIS (ARGON PLASMA COAGULATION/BICAP);  Surgeon: Daneil Dolin, MD;  Location: [facility];  Service: Endoscopy;;  rectum  . KNEE SURGERY     left knee   . LEFT HEART CATHETERIZATION WITH CORONARY ANGIOGRAM N/A 06/17/2014   Procedure: LEFT  HEART CATHETERIZATION WITH CORONARY ANGIOGRAM;  Surgeon: Burnell Blanks, MD;  Location[facility] C[facility] LAB;  Service: Cardiovascular;  Laterality: N/A;  . PARTIAL COLECTOMY  02/29/2012   Procedure: PARTIAL COLECTOMY;  Surgeon: Adin Hector, MD;  Location: [facility]S;  Service: General;  Laterality: Right;  . POLYPECTOMY  11/10/2017   Procedure: POLYPECTOMY;  Surgeon: Daneil Dolin, MD;  Location: [facility];  Service: Endoscopy;;  gastric colon  . PROSTATECTOMY    . TONSILLECTOMY    . TRANSURETHRAL RESECTION OF PROSTATE    . VASECTOMY       SOCIAL HISTORY:  Social History   Socioeconomic History  . Marital status: Married    Spouse name: Not on file  . Number of children: 3  . 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 Highest education level: Not on file  Occupational History  . 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 Financial resource strain: Not on file  . Food insecurity:    Worry: Not on file  Inability: Not on file  . Transportation needs:    Medical: Not on file    Non-medical: Not on file  Tobacco Use  . Smoking status: Never Smoker  . Smokeless tobacco: Never Used  Substance and Sexual Activity  . Alcohol use: Yes    Comment: 1 cocktail/month  . Drug use: No  . Sexual activity: Yes    Birth control/protection: None  Lifestyle  . Physical activity:    Days per week: Not on file    Minutes per session: Not on file  . Stress: Not on file  Relationships  . Social connections:    Talks on phone: Not on file    Gets together: Not on file    Attends religious service: Not on file    Active member of club or organization: Not on file    Attends meetings of clubs or organizations: Not on file    Relationship status: Not on file  . Intimate partner violence:    Fear of current or ex partner: Not on file    Emotionally abused: Not on file    Physically abused: Not on file    Forced sexual activity: Not on file  Other Topics Concern  . Not on  file  Social History Narrative  . Not on file    FAMILY HISTORY:  Family History  Problem Relation Age of Onset  . Throat cancer Father        dx in his 65s and again in his 38s; pipe and cigar smoker  . Neuropathy Brother   . Prostate cancer Brother 53  . Melanoma Brother        dx in his 34s  . Cancer Paternal Uncle        smoking related cancer  . Heart attack Paternal Grandfather   . Stomach cancer Cousin 39  . Colon cancer Neg Hx     CURRENT MEDICATIONS:  Outpatient Encounter Medications as of 06/13/2018  Medication Sig Note  . ALPRAZolam (XANAX) 0.5 MG tablet Take 0.5 mg by mouth daily as needed for anxiety. 10/26/2017: Primarily uses this when fly  . aspirin EC 81 MG tablet Take 81 mg by mouth daily.   . B Complex Vitamins (VITAMIN B COMPLEX PO) Take 1 tablet by mouth at bedtime.    . cetirizine (ZYRTEC) 10 MG tablet Take 10 mg by mouth daily.   . cholecalciferol (VITAMIN D) 400 units TABS tablet Take 400 Units by mouth daily.   . citalopram (CELEXA) 20 MG tablet Take 20 mg by mouth daily.   . clopidogrel (PLAVIX) 75 MG tablet TAKE (1) TABLET BY MOUTH ONCE DAILY.   . folic acid (FOLVITE) 1 MG tablet TAKE (1) TABLET BY MOUTH ONCE DAILY .   Marland Kitchen hydrocortisone (ANUSOL-HC) 2.5 % rectal cream Place 1 application rectally 3 (three) times daily. (Patient taking differently: Place 1 application rectally as needed. )   . ibuprofen (ADVIL,MOTRIN) 200 MG tablet Take 400 mg by mouth daily as needed for headache or moderate pain.   . isosorbide mononitrate (IMDUR) 30 MG 24 hr tablet isosorbide mononitrate ER 30 mg tablet,extended release 24 hr   . latanoprost (XALATAN) 0.005 % ophthalmic solution Place 1 drop into both eyes at bedtime.    Marland Kitchen loperamide (IMODIUM) 2 MG capsule Take 2 mg by mouth as needed for diarrhea or loose stools.   Marland Kitchen losartan (COZAAR) 50 MG tablet Take 25 mg by mouth 2 (two) times daily.    . meloxicam (MOBIC) 15  MG tablet meloxicam 15 mg tablet   . metoprolol  (LOPRESSOR) 50 MG tablet Take 50 mg by mouth 2 (two) times daily.    . montelukast (SINGULAIR) 10 MG tablet Take 10 mg by mouth at bedtime.   . Multiple Vitamin (MULTIVITAMIN WITH MINERALS) TABS tablet Take 1 tablet by mouth daily.   Marland Kitchen NITROSTAT 0.4 MG SL tablet PLACE ONE (1) TABLET UNDER TONGUE EVERY 5 MINUTES UP TO (3) DOSES AS NEEDED FOR CHEST PAIN.   Marland Kitchen ofloxacin (OCUFLOX) 0.3 % ophthalmic solution Take as directed.   . pantoprazole (PROTONIX) 40 MG tablet TAKE 2 TABLETS BY MOUTH ONCE DAILY. (Patient taking differently: Takes one tablet daily)   . pravastatin (PRAVACHOL) 40 MG tablet TAKE 1 TABLET BY MOUTH AT BEDTIME.   . Probiotic Product (PROBIOTIC DAILY PO) Take 1 capsule by mouth daily.   Marland Kitchen saccharomyces boulardii (FLORASTOR) 250 MG capsule Take by mouth.   . vitamin B-12 (CYANOCOBALAMIN) 1000 MCG tablet Take by mouth.   . Vitamins/Minerals TABS Take by mouth.   . Wheat Dextrin (BENEFIBER) POWD Take by mouth. Daily in the AM    No facility-administered encounter medications on file as of 06/13/2018.     ALLERGIES:  Allergies  Allergen Reactions  . Shellfish Allergy Anaphylaxis  . Sulfonamide Derivatives Diarrhea and Nausea Only     PHYSICAL EXAM:  ECOG Performance status: 1  Vitals:   06/13/18 1523  BP: (!) 142/67  Pulse: 60  Resp: 18  Temp: 98.5 F (36.9 C)  SpO2: 98%   Filed Weights   06/13/18 1523  Weight: 221 lb 6.4 oz (100.4 kg)    Physical Exam  Constitutional: He is oriented to person, place, and time. He appears well-developed and well-nourished.  Musculoskeletal: Normal range of motion.  Neurological: He is alert and oriented to person, place, and time.  Skin: Skin is warm and dry.  Psychiatric: He has a normal mood and affect. His behavior is normal. Judgment and thought content normal.  Abdomen: Soft nontender with no palpable organomegaly.   LABORATORY DATA:  I have reviewed the labs as listed.  CBC    Component Value Date/Time   WBC 16.8 (H)  06/06/2018 1155   RBC 5.02 06/06/2018 1155   HGB 15.3 06/06/2018 1155   HCT 48.3 06/06/2018 1155   PLT 371 06/06/2018 1155   MCV 96.2 06/06/2018 1155   MCH 30.5 06/06/2018 1155   MCHC 31.7 06/06/2018 1155   RDW 12.8 06/06/2018 1155   LYMPHSABS 5.4 (H) 06/06/2018 1155   MONOABS 1.3 (H) 06/06/2018 1155   EOSABS 0.3 06/06/2018 1155   BASOSABS 0.2 (H) 06/06/2018 1155   CMP Latest Ref Rng & Units 06/06/2018 12/12/2017 11/03/2017  Glucose 70 - 99 mg/dL 96 129(H) 102(H)  BUN 8 - 23 mg/dL 18 13 14   Creatinine 0.61 - 1.24 mg/dL 1.41(H) 1.12 1.11  Sodium 135 - 145 mmol/L 142 140 141  Potassium 3.5 - 5.1 mmol/L 4.4 4.5 4.0  Chloride 98 - 111 mmol/L 109 107 107  CO2 22 - 32 mmol/L 26 24 25   Calcium 8.9 - 10.3 mg/dL 9.2 8.9 8.8(L)  Total Protein 6.5 - 8.1 g/dL 7.0 6.8 -  Total Bilirubin 0.3 - 1.2 mg/dL 1.0 0.8 -  Alkaline Phos 38 - 126 U/L 58 57 -  AST 15 - 41 U/L 19 25 -  ALT 0 - 44 U/L 21 20 -            ASSESSMENT & PLAN:   CLL (  chronic lymphocytic leukemia) ([place]) 1.  Stage III ascending colon cancer (T3N1C) 2 /20 lymph nodes positive: -Status post right hemicolectomy on 02/29/2012, could not tolerate adjuvant Xeloda -EGD shows multiple benign gastric polyps, otherwise within normal limits -He has completed 5 years of surveillance.  Hence no more scans were ordered. -I discussed the results of CEA which was 2.1.  He had colonoscopy on 11/10/2017 which showed radiation proctitis changes.  Diverticulosis in the sigmoid colon.  One polyp in the descending colon was removed.  2.  Prostate cancer: -Underwent radiation therapy at [facility] long [facility] in 2011.  Also underwent TURP x2. - Recent PSA 0.14. -He is having urgency symptoms in the last 2 weeks.  I have told him to contact Dr. Juel Burrow (urologist) office in [facility].  3.  Stage 0 CLL: -Diagnosed by flow on 12/13/2014 -Today's examination did not reveal any palpable adenopathy or splenomegaly. -Denies any recurrent infections or  hospitalizations. - His white count is 16.8, platelet count 371.  Hemoglobin was 15.3. -We will see him back in 6 months for follow-up with repeat blood work and physical exam.      Orders placed this encounter:  Orders Placed This Encounter  Procedures  . CBC with Differential/Platelet  . Comprehensive metabolic panel  . CEA  . PSA      Derek Jack, MD [place] [phone]

## 2018-06-13 NOTE — Assessment & Plan Note (Signed)
1.  Stage III ascending colon cancer (T3N1C) 2 /20 lymph nodes positive: -Status post right hemicolectomy on 02/29/2012, could not tolerate adjuvant Xeloda -EGD shows multiple benign gastric polyps, otherwise within normal limits -He has completed 5 years of surveillance.  Hence no more scans were ordered. -I discussed the results of CEA which was 2.1.  He had colonoscopy on 11/10/2017 which showed radiation proctitis changes.  Diverticulosis in the sigmoid colon.  One polyp in the descending colon was removed.  2.  Prostate cancer: -Underwent radiation therapy at [facility] long hospital in 2011.  Also underwent TURP x2. - Recent PSA 0.14. -He is having urgency symptoms in the last 2 weeks.  I have told him to contact Dr. Juel Burrow (urologist) office in [facility].  3.  Stage 0 CLL: -Diagnosed by flow on 12/13/2014 -Today's examination did not reveal any palpable adenopathy or splenomegaly. -Denies any recurrent infections or hospitalizations. - His white count is 16.8, platelet count 371.  Hemoglobin was 15.3. -We will see him back in 6 months for follow-up with repeat blood work and physical exam.

## 2018-06-30 ENCOUNTER — Other Ambulatory Visit: Payer: Self-pay | Admitting: Cardiovascular Disease

## 2018-07-03 NOTE — Telephone Encounter (Signed)
Rx(s) sent to pharmacy electronically.  

## 2018-07-12 ENCOUNTER — Encounter: Payer: Self-pay | Admitting: Adult Health

## 2018-07-12 ENCOUNTER — Ambulatory Visit (INDEPENDENT_AMBULATORY_CARE_PROVIDER_SITE_OTHER): Payer: Medicare Other | Admitting: Adult Health

## 2018-07-12 VITALS — BP 120/72 | HR 52 | Ht 71.0 in | Wt 221.4 lb

## 2018-07-12 DIAGNOSIS — I2510 Atherosclerotic heart disease of native coronary artery without angina pectoris: Secondary | ICD-10-CM

## 2018-07-12 DIAGNOSIS — I10 Essential (primary) hypertension: Secondary | ICD-10-CM

## 2018-07-12 NOTE — Progress Notes (Signed)
Cardiology Office Note   Date:  07/12/2018   ID:  David Ades., DOB 1935-07-26, MRN [id number]  PCP:  David Lair., MD  Cardiologist:  David Pugh  No chief complaint on file.    History of Present Illness: David Pugh. is a 83 y.o. male who presents for ongoing assessment and management of CAD, hx of DES to the mid LAD and Ramus in 2012, balloon angioplasty of small OM brach in 2015.   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 Significant comorbid conditions include systemic hypertension and hyperlipidemia, both treated. In 2010, he underwent surgery and radiation for prostate cancer. In 2012, he had stage IIIB colon cancer with 2 out of 20 positive lymph nodes but was intolerant of chemotherapy. 4 years have passed since his surgery and he has no evidence of disease. He has chronic lymphocytic leukemia, but does not require any treatment for this at this time.  His only complaints are musculoskeletal in knees and back. He does not like to drive very far, and ambulation is slower for him. He uses a cane now.    Was last seen by Dr. Sallyanne Pugh on 05/03/2017 and was stable, asymptomatic He was advised to continue his exercise regimen and to see PCP for ongoing surveillance of colon cancer and prostate cancer, which is in remission.     Past Medical History:  Diagnosis Date  . Abnormality of gait 12/18/2013  . Anemia    hx of anemia as a child   . Arthritis   . Biliary dyskinesia   . CAD (coronary artery disease) 2012   a. hx of stent x2 in ramus/OM and mid LAD in 2012  b. balloon angioplaty of OM  . CLL (chronic lymphocytic leukemia) ([place])   . Colon cancer ([place]) 12/2011   s/p right hemicolectomy, did not tolerate chemo  . Colon polyps    tubular adenomas  . Diverticula of colon 2010  . Exposure to [employer]   . Family history of prostate cancer   . Family history of stomach cancer   . Folic acid  deficiency   . Gastroparesis 04/12/2013   borderline  . GERD (gastroesophageal reflux disease)   . Glaucoma   . Heart palpitations 10/2012  . Hiatal hernia 2010   tiny  . History of kidney stones   . HTN (hypertension)   . Iron deficiency 05/02/2012   followed by Dr. Tressie [name], iron infusions.   . Myocardial infarction ([place]) 2012  . Peripheral neuropathy   . Prostate cancer [facility]) 2010   s/p radiation, surgery  . PTSD (post-traumatic stress disorder)   . Radiation Feb 2011   treatment  . Radiation proctitis   . Rosacea     Past Surgical History:  Procedure Laterality Date  . CARDIAC CATHETERIZATION  02/13/2011   [facility], [facility] cardiology  . CATARACT EXTRACTION     2011  . CATARACT EXTRACTION W/PHACO Right 08/06/2013   Procedure: CATARACT EXTRACTION PHACO AND INTRAOCULAR LENS PLACEMENT (IOC);  Surgeon: Tonny Branch, MD;  Location: [facility];  Service: Ophthalmology;  Laterality: Right;  CDE:20.96  . CHOLECYSTECTOMY    . COLONOSCOPY  02/12/2004   Dr. Gala Romney- L side diverticula, inflammatory  colon polyps  . COLONOSCOPY  01/19/2012   RMR: Prominent changes involving the rectal mucosa consistent with radiation-induced proctitis. Multiple colonic  polyps removed as described above. Sigmoid Diverticulosis/ 1.5 x 2 cm relatively flat ulcerated lesion in cecum/path  showed adenocarcinoma of the colon. descending colon polyp with tubular adenoma and one fragment of focal high grade dysplasia.   . COLONOSCOPY  08/28/2012   FXT:[id number] proctitis. Colonic diverticulosis/Ulcerations at the surgical anastomosis  path: ulcerated colonic mucosa with prolapse changes, tubular adenoma.   . COLONOSCOPY N/A 08/02/2013   RMR: Colonic polyps -removed as described above. Colonic diverticulosis. Status post reight hemicolectomy. Radiation proctitis- asymptomatic.   Marland Kitchen COLONOSCOPY N/A 05/09/2015   Procedure: COLONOSCOPY;  Surgeon: Daneil Dolin, MD;  Location: [facility];  Service: Endoscopy;  Laterality: N/A;   1200-moved to [address] to notify pt  . COLONOSCOPY WITH PROPOFOL N/A 11/10/2017   Procedure: COLONOSCOPY WITH PROPOFOL;  Surgeon: Daneil Dolin, MD;  Location: [facility];  Service: Endoscopy;  Laterality: N/A;  . CORONARY ANGIOPLASTY  06/17/14   OM1 POBA  . CORONARY STENT PLACEMENT  01/2011   2.5x96mm Xience drug-eluting stent in ramus intermedius/OMI vessel, 2.5x54mm Promus Element stent in mid LAD artery  . ESOPHAGOGASTRODUODENOSCOPY  10/10/2008   Dr. Tilda Burrow hiatal hernia, normal esophagus, normal stomach  . ESOPHAGOGASTRODUODENOSCOPY  02/12/2004   Dr. Dudley Major- normal   . ESOPHAGOGASTRODUODENOSCOPY N/A 05/09/2015   Procedure: ESOPHAGOGASTRODUODENOSCOPY (EGD);  Surgeon: Daneil Dolin, MD;  [facility];  Service: Endoscopy;  Laterality: N/A;  . ESOPHAGOGASTRODUODENOSCOPY (EGD) WITH PROPOFOL N/A 11/10/2017   Procedure: ESOPHAGOGASTRODUODENOSCOPY (EGD) WITH PROPOFOL;  Surgeon: Daneil Dolin, MD;  [facility]: [facility];  Service: Endoscopy;  Laterality: N/A;  9:45am  . GIVENS CAPSULE STUDY N/A 06/03/2015   Procedure: GIVENS CAPSULE STUDY;  Surgeon: Daneil Dolin, MD;  [facility]: [facility];  Service: Endoscopy;  Laterality: N/A;  0700  . HOT HEMOSTASIS  11/10/2017   Procedure: HOT HEMOSTASIS (ARGON PLASMA COAGULATION/BICAP);  Surgeon: Daneil Dolin, MD;  [facility];  Service: Endoscopy;;  rectum  . KNEE SURGERY     left knee   . LEFT HEART CATHETERIZATION WITH CORONARY ANGIOGRAM N/A 06/17/2014   Procedure: LEFT HEART CATHETERIZATION WITH CORONARY ANGIOGRAM;  Surgeon: Burnell Blanks, MD;  Location[facility];  Service: Cardiovascular;  Laterality: N/A;  . PARTIAL COLECTOMY  02/29/2012   Procedure: PARTIAL COLECTOMY;  Surgeon: Adin Hector, MD;  Location: [facility];  Service: General;  Laterality: Right;  . POLYPECTOMY  11/10/2017   Procedure: POLYPECTOMY;  Surgeon: Daneil Dolin, MD;  [facility];  Service: Endoscopy;;  gastric colon    . PROSTATECTOMY    . TONSILLECTOMY    . TRANSURETHRAL RESECTION OF PROSTATE    . VASECTOMY       Current Outpatient Medications  Medication Sig Dispense Refill  . ALPRAZolam (XANAX) 0.5 MG tablet Take 0.5 mg by mouth daily as needed for anxiety.    Marland Kitchen aspirin EC 81 MG tablet Take 81 mg by mouth daily.    . B Complex Vitamins (VITAMIN B COMPLEX PO) Take 1 tablet by mouth at bedtime.     . cetirizine (ZYRTEC) 10 MG tablet Take 10 mg by mouth daily.    . cholecalciferol (VITAMIN D) 400 units TABS tablet Take 400 Units by mouth daily.    . citalopram (CELEXA) 20 MG tablet Take 20 mg by mouth daily.    . clopidogrel (PLAVIX) 75 MG tablet TAKE (1) TABLET BY MOUTH ONCE DAILY. 90 tablet 3  . folic acid (FOLVITE) 1 MG tablet TAKE (1) TABLET BY MOUTH ONCE DAILY . 30 tablet 0  . hydrocortisone (ANUSOL-HC) 2.5 % rectal cream  Place 1 application rectally 3 (three) times daily. (Patient taking differently: Place 1 application rectally as needed. ) 30 g 1  . ibuprofen (ADVIL,MOTRIN) 200 MG tablet Take 400 mg by mouth daily as needed for headache or moderate pain.    . isosorbide mononitrate (IMDUR) 30 MG 24 hr tablet isosorbide mononitrate ER 30 mg tablet,extended release 24 hr    . latanoprost (XALATAN) 0.005 % ophthalmic solution Place 1 drop into both eyes at bedtime.     Marland Kitchen loperamide (IMODIUM) 2 MG capsule Take 2 mg by mouth as needed for diarrhea or loose stools.    Marland Kitchen losartan (COZAAR) 50 MG tablet Take 25 mg by mouth 2 (two) times daily.     . meloxicam (MOBIC) 15 MG tablet meloxicam 15 mg tablet    . metoprolol (LOPRESSOR) 50 MG tablet Take 50 mg by mouth 2 (two) times daily.     . montelukast (SINGULAIR) 10 MG tablet Take 10 mg by mouth at bedtime.    . Multiple Vitamin (MULTIVITAMIN WITH MINERALS) TABS tablet Take 1 tablet by mouth daily.    Marland Kitchen NITROSTAT 0.4 MG SL tablet PLACE ONE (1) TABLET UNDER TONGUE EVERY 5 MINUTES UP TO (3) DOSES AS NEEDED FOR CHEST PAIN. 25 tablet 0  . ofloxacin  (OCUFLOX) 0.3 % ophthalmic solution Take as directed.    . pantoprazole (PROTONIX) 40 MG tablet TAKE 2 TABLETS BY MOUTH ONCE DAILY. (Patient taking differently: Takes one tablet daily) 60 tablet 6  . pravastatin (PRAVACHOL) 40 MG tablet TAKE 1 TABLET BY MOUTH AT BEDTIME. 90 tablet 2  . Probiotic Product (PROBIOTIC DAILY PO) Take 1 capsule by mouth daily.    Marland Kitchen saccharomyces boulardii (FLORASTOR) 250 MG capsule Take by mouth.    . vitamin B-12 (CYANOCOBALAMIN) 1000 MCG tablet Take by mouth.    . Vitamins/Minerals TABS Take by mouth.    . Wheat Dextrin (BENEFIBER) POWD Take by mouth. Daily in the AM     No current facility-administered medications for this visit.     Allergies:   Shellfish allergy and Sulfonamide derivatives    Social History:  The patient  reports that he has never smoked. He has never used smokeless tobacco. He reports that he drinks alcohol. He reports that he does not use drugs.   Family History:  The patient's family history includes Cancer in his paternal uncle; Heart attack in his paternal grandfather; Melanoma in his brother; Neuropathy in his brother; Prostate cancer (age of onset: 80) in his brother; Stomach cancer (age of onset: 39) in his cousin; Throat cancer in his father.    ROS: All other systems are reviewed and negative. Unless otherwise mentioned in H&P    PHYSICAL EXAM: VS:  There were no vitals taken for this visit. , BMI There is no height or weight on file to calculate BMI. GEN: Well nourished, well developed, in no acute distress HEENT: normal Neck: no JVD, carotid bruits, or masses Cardiac: RRR; 1/6  Systolic murmurs, rubs, or gallops,no edema  Respiratory:  Clear to auscultation bilaterally, normal work of breathing GI: soft, nontender, nondistended, + BS MS: no deformity or atrophy Skin: warm and dry, no rash Neuro:  Strength and sensation are intact Psych: euthymic mood, full affect   EKG Sinus bradycardia rate of 52 bpm.   Recent  Labs: 06/06/2018: ALT 21; BUN 18; Creatinine, Ser 1.41; Hemoglobin 15.3; Platelets 371; Potassium 4.4; Sodium 142    Lipid Panel    Component Value Date/Time   CHOL  133 04/27/2017 0908   TRIG 106 04/27/2017 0908   HDL 63 04/27/2017 0908   CHOLHDL 2.1 04/27/2017 0908   CHOLHDL 1.7 05/05/2016 0840   VLDL 15 05/05/2016 0840   LDLCALC 49 04/27/2017 0908      Wt Readings from Last 3 Encounters:  06/13/18 221 lb 6.4 oz (100.4 kg)  04/18/18 224 lb (101.6 kg)  12/15/17 224 lb 6.4 oz (101.8 kg)      Other studies Reviewed: See scanned document for stress test 10/16/2012.   ASSESSMENT AND PLAN:  1.  CAD: He is without cardiac complaints. BP and HR are well controlled. He is asymptomatic from cardiac standpoint. No changes in his regimen. No planned cardiac testing .  2. Hypertension: Stable no changes in his regimen.                                   3. Colon and Prostate Cancer: Followed by oncology. He is in remission.   Current medicines are reviewed at length with the patient today.    Labs/ tests ordered today include: Labs are completed by PCP and oncology.  Patient request transfer to [facility] office cardiologist as this is closer to his home. I message has been sent to Dr. Sallyanne Pugh and to Dr. Domenic Polite.   David Pugh. David Pugh, ANP, [facility]   07/12/2018 7:35 AM    [address] Office [phone] Fax [phone]

## 2018-07-12 NOTE — Patient Instructions (Signed)
Follow-Up: You will need a follow up appointment in 6 MONTHS.  Please call our office 2 months in [facility] 2020) to schedule the (MAY 2020) appointment.  You may see  DR [facility] OFF[facility], Jory Sims, DNP, ANP -or- one of the Advanced Practice Providers on your designated Care Team  Medication Instructions:  NO CHANGES- Your physician recommends that you continue on your current medications as directed. Please refer to the Current Medication list given to you today.  If you need a refill on your cardiac medications before your next appointment, please call your pharmacy.  Labwork: If you have labs (blood work) drawn today and your tests are completely normal, you will receive your results ONLY by: . MyChart Message (if you have MyChart) -OR- . A paper copy in the mail  At [facility], you and your health needs are our priority.  As part of our continuing mission to provide you with exceptional heart care, we have created designated Provider Care Teams.  These Care Teams include your primary Cardiologist (physician) and Advanced Practice Providers (APPs -  Physician Assistants and Nurse Practitioners) who all work together to provide you with the care you need, when you need it.  Thank you for choosing [facility] at [facility]!!

## 2018-08-15 ENCOUNTER — Other Ambulatory Visit: Payer: Self-pay | Admitting: Cardiovascular Disease

## 2018-08-18 ENCOUNTER — Other Ambulatory Visit: Payer: Self-pay | Admitting: Cardiovascular Disease

## 2018-11-15 ENCOUNTER — Other Ambulatory Visit: Payer: Self-pay | Admitting: Cardiovascular Disease

## 2018-11-28 ENCOUNTER — Institutional Professional Consult (permissible substitution): Payer: TRICARE For Life (TFL) | Admitting: Neurology

## 2018-12-19 ENCOUNTER — Other Ambulatory Visit: Payer: Self-pay

## 2018-12-19 ENCOUNTER — Inpatient Hospital Stay (HOSPITAL_COMMUNITY): Payer: Medicare Other | Attending: Hematology

## 2018-12-19 DIAGNOSIS — Z9221 Personal history of antineoplastic chemotherapy: Secondary | ICD-10-CM | POA: Insufficient documentation

## 2018-12-19 DIAGNOSIS — Z8546 Personal history of malignant neoplasm of prostate: Secondary | ICD-10-CM | POA: Diagnosis not present

## 2018-12-19 DIAGNOSIS — Z800 Family history of malignant neoplasm of digestive organs: Secondary | ICD-10-CM | POA: Diagnosis not present

## 2018-12-19 DIAGNOSIS — Z8042 Family history of malignant neoplasm of prostate: Secondary | ICD-10-CM | POA: Diagnosis not present

## 2018-12-19 DIAGNOSIS — Z79899 Other long term (current) drug therapy: Secondary | ICD-10-CM | POA: Diagnosis not present

## 2018-12-19 DIAGNOSIS — C9110 Chronic lymphocytic leukemia of B-cell type not having achieved remission: Secondary | ICD-10-CM | POA: Diagnosis not present

## 2018-12-19 DIAGNOSIS — Z85038 Personal history of other malignant neoplasm of large intestine: Secondary | ICD-10-CM | POA: Insufficient documentation

## 2018-12-19 DIAGNOSIS — D509 Iron deficiency anemia, unspecified: Secondary | ICD-10-CM | POA: Diagnosis not present

## 2018-12-19 DIAGNOSIS — Z7982 Long term (current) use of aspirin: Secondary | ICD-10-CM | POA: Diagnosis not present

## 2018-12-19 DIAGNOSIS — Z923 Personal history of irradiation: Secondary | ICD-10-CM | POA: Insufficient documentation

## 2018-12-19 DIAGNOSIS — I10 Essential (primary) hypertension: Secondary | ICD-10-CM | POA: Diagnosis not present

## 2018-12-19 LAB — CBC WITH DIFFERENTIAL/PLATELET
Abs Immature Granulocytes: 0.09 10*3/uL — ABNORMAL HIGH (ref 0.00–0.07)
Basophils Absolute: 0.1 10*3/uL (ref 0.0–0.1)
Basophils Relative: 1 %
Eosinophils Absolute: 0.5 10*3/uL (ref 0.0–0.5)
Eosinophils Relative: 3 %
HCT: 45.6 % (ref 39.0–52.0)
Hemoglobin: 14.8 g/dL (ref 13.0–17.0)
Immature Granulocytes: 1 %
Lymphocytes Relative: 43 %
Lymphs Abs: 6.8 10*3/uL — ABNORMAL HIGH (ref 0.7–4.0)
MCH: 30.8 pg (ref 26.0–34.0)
MCHC: 32.5 g/dL (ref 30.0–36.0)
MCV: 95 fL (ref 80.0–100.0)
Monocytes Absolute: 1.2 10*3/uL — ABNORMAL HIGH (ref 0.1–1.0)
Monocytes Relative: 8 %
Neutro Abs: 7 10*3/uL (ref 1.7–7.7)
Neutrophils Relative %: 44 %
Platelets: 359 10*3/uL (ref 150–400)
RBC: 4.8 MIL/uL (ref 4.22–5.81)
RDW: 12.5 % (ref 11.5–15.5)
WBC: 15.7 10*3/uL — ABNORMAL HIGH (ref 4.0–10.5)
nRBC: 0 % (ref 0.0–0.2)

## 2018-12-19 LAB — COMPREHENSIVE METABOLIC PANEL
ALT: 20 U/L (ref 0–44)
AST: 22 U/L (ref 15–41)
Albumin: 3.9 g/dL (ref 3.5–5.0)
Alkaline Phosphatase: 54 U/L (ref 38–126)
Anion gap: 6 (ref 5–15)
BUN: 16 mg/dL (ref 8–23)
CO2: 25 mmol/L (ref 22–32)
Calcium: 8.8 mg/dL — ABNORMAL LOW (ref 8.9–10.3)
Chloride: 107 mmol/L (ref 98–111)
Creatinine, Ser: 1.15 mg/dL (ref 0.61–1.24)
GFR calc Af Amer: >60 mL/min (ref >60)
GFR calc non Af Amer: 59 mL/min — ABNORMAL LOW (ref >60)
Glucose, Bld: 90 mg/dL (ref 70–99)
Potassium: 4.4 mmol/L (ref 3.5–5.1)
Sodium: 138 mmol/L (ref 135–145)
Total Bilirubin: 0.9 mg/dL (ref 0.3–1.2)
Total Protein: 6.7 g/dL (ref 6.5–8.1)

## 2018-12-19 LAB — PSA: Prostatic Specific Antigen: 0.14 ng/mL (ref 0.00–4.00)

## 2018-12-20 LAB — CEA: CEA: 1.8 ng/mL (ref 0.0–4.7)

## 2018-12-25 ENCOUNTER — Other Ambulatory Visit: Payer: Self-pay

## 2018-12-26 ENCOUNTER — Inpatient Hospital Stay (HOSPITAL_BASED_OUTPATIENT_CLINIC_OR_DEPARTMENT_OTHER): Payer: Medicare Other | Admitting: Nurse Practitioner

## 2018-12-26 ENCOUNTER — Ambulatory Visit (HOSPITAL_COMMUNITY): Payer: Medicare Other | Admitting: Hematology

## 2018-12-26 VITALS — BP 151/70 | HR 61 | Temp 98.1°F | Resp 18 | Wt 221.0 lb

## 2018-12-26 DIAGNOSIS — Z79899 Other long term (current) drug therapy: Secondary | ICD-10-CM

## 2018-12-26 DIAGNOSIS — Z923 Personal history of irradiation: Secondary | ICD-10-CM

## 2018-12-26 DIAGNOSIS — Z85038 Personal history of other malignant neoplasm of large intestine: Secondary | ICD-10-CM

## 2018-12-26 DIAGNOSIS — C9110 Chronic lymphocytic leukemia of B-cell type not having achieved remission: Secondary | ICD-10-CM

## 2018-12-26 DIAGNOSIS — Z7982 Long term (current) use of aspirin: Secondary | ICD-10-CM

## 2018-12-26 DIAGNOSIS — D509 Iron deficiency anemia, unspecified: Secondary | ICD-10-CM | POA: Diagnosis not present

## 2018-12-26 DIAGNOSIS — Z9221 Personal history of antineoplastic chemotherapy: Secondary | ICD-10-CM

## 2018-12-26 DIAGNOSIS — Z800 Family history of malignant neoplasm of digestive organs: Secondary | ICD-10-CM

## 2018-12-26 DIAGNOSIS — I10 Essential (primary) hypertension: Secondary | ICD-10-CM

## 2018-12-26 DIAGNOSIS — Z8546 Personal history of malignant neoplasm of prostate: Secondary | ICD-10-CM | POA: Diagnosis not present

## 2018-12-26 DIAGNOSIS — Z8042 Family history of malignant neoplasm of prostate: Secondary | ICD-10-CM

## 2018-12-26 NOTE — Progress Notes (Signed)
[place], [place] [address]   CLINIC:  Medical Oncology/Hematology  PCP:  Sandi Mealy, MD [place] [address] [phone]   REASON FOR VISIT: Follow-up for CLL, colon cancer, prostate cancer  CURRENT THERAPY: Observation  BRIEF ONCOLOGIC HISTORY:    Cancer of ascending colon ([place])   02/29/2012 Procedure    laparoscopic assisted R colectomy    03/09/2012 Miscellaneous    Iron deficiency and folic acid deficiency    03/29/2012 Initial Diagnosis    Cancer of ascending colon, CEA on 01/19/2012 was WNL    08/02/2013 Procedure    Ileocolonoscopy with snare polypectomy    06/11/2014 Imaging    CT abdomen/pelvis with no findings to suggest metastatic disease. Diverticulosis    06/11/2016 Imaging    CT Abdomen/Pelvis with no findings to suggest recurrent or metastatic disease. Prostatomegaly.       CANCER STAGING: Cancer Staging Cancer of ascending colon ([place]) Staging form: Colon and Rectum, AJCC 7th Edition - Clinical: Stage IIIB (T3, N1c, M0) - Signed by Baird Cancer, PA on 05/02/2012    INTERVAL HISTORY:  David Pugh 84 y.o. male returns for routine follow-up for history of CLL, colon cancer, and prostate cancer.  Patient is doing well since his last visit.  He has no complaints at this time.  He denies any easy bruising or bleeding.  He denies any B symptoms.  He denies any unexplained weight loss. Denies any nausea, vomiting, or diarrhea. Denies any new pains. Had not noticed any recent bleeding such as epistaxis, hematuria or hematochezia. Denies recent chest pain on exertion, shortness of breath on minimal exertion, pre-syncopal episodes, or palpitations. Denies any numbness or tingling in hands or feet. Denies any recent fevers, infections, or recent hospitalizations. Patient reports appetite at 100% and energy level at 100%.  He is eating well and maintaining his weight at this time.   REVIEW OF  SYSTEMS:  Review of Systems  All other systems reviewed and are negative.    PAST MEDICAL/SURGICAL HISTORY:  Past Medical History:  Diagnosis Date  . Abnormality of gait 12/18/2013  . Anemia    hx of anemia as a child   . Arthritis   . Biliary dyskinesia   . CAD (coronary artery disease) 2012   a. hx of stent x2 in ramus/OM and mid LAD in 2012  b. balloon angioplaty of OM  . CLL (chronic lymphocytic leukemia) ([place])   . Colon cancer ([place]) 12/2011   s/p right hemicolectomy, did not tolerate chemo  . Colon polyps    tubular adenomas  . Diverticula of colon 2010  . Exposure to [employer]   . Family history of prostate cancer   . Family history of stomach cancer   . Folic acid deficiency   . Gastroparesis 04/12/2013   borderline  . GERD (gastroesophageal reflux disease)   . Glaucoma   . Heart palpitations 10/2012  . Hiatal hernia 2010   tiny  . History of kidney stones   . HTN (hypertension)   . Iron deficiency 05/02/2012   followed by Dr. Tressie [name], iron infusions.   . Myocardial infarction ([place]) 2012  . Peripheral neuropathy   . Prostate cancer [facility]) 2010   s/p radiation, surgery  . PTSD (post-traumatic stress disorder)   . Radiation Feb 2011   treatment  . Radiation proctitis   . Rosacea    Past Surgical History:  Procedure Laterality Date  .  CARDIAC CATHETERIZATION  02/13/2011   [facility] cardiology  . CATARACT EXTRACTION     2011  . CATARACT EXTRACTION W/PHACO Right 08/06/2013   Procedure: CATARACT EXTRACTION PHACO AND INTRAOCULAR LENS PLACEMENT (IOC);  Surgeon: Tonny Branch, MD;  Location: [facility] ORS;  Service: Ophthalmology;  Laterality: Right;  CDE:20.96  . CHOLECYSTECTOMY    . COLONOSCOPY  02/12/2004   Dr. Gala Romney- L side diverticula, inflammatory  colon polyps  . COLONOSCOPY  01/19/2012   RMR: Prominent changes involving the rectal mucosa consistent with radiation-induced proctitis. Multiple colonic  polyps removed as described above. Sigmoid Diverticulosis/  1.5 x 2 cm relatively flat ulcerated lesion in cecum/path showed adenocarcinoma of the colon. descending colon polyp with tubular adenoma and one fragment of focal high grade dysplasia.   . COLONOSCOPY  08/28/2012   FWY:OVZCHYIFO proctitis. Colonic diverticulosis/Ulcerations at the surgical anastomosis  path: ulcerated colonic mucosa with prolapse changes, tubular adenoma.   . COLONOSCOPY N/A 08/02/2013   RMR: Colonic polyps -removed as described above. Colonic diverticulosis. Status post reight hemicolectomy. Radiation proctitis- asymptomatic.   Marland Kitchen COLONOSCOPY N/A 05/09/2015   Procedure: COLONOSCOPY;  Surgeon: Daneil Dolin, MD;  Location: [facility] SU[facility];  Service: Endoscopy;  Laterality: N/A;  1200-moved to 1145 [place] to notify pt  . COLONOSCOPY WITH PROPOFOL N/A 11/10/2017   Procedure: COLONOSCOPY WITH PROPOFOL;  Surgeon: Daneil Dolin, MD;  Location: [facility];  Service: Endoscopy;  Laterality: N/A;  . CORONARY ANGIOPLASTY  06/17/14   OM1 POBA  . CORONARY STENT PLACEMENT  01/2011   2.5x1mm Xience drug-eluting stent in ramus intermedius/OMI vessel, 2.5x98mm Promus Element stent in mid LAD artery  . ESOPHAGOGASTRODUODENOSCOPY  10/10/2008   Dr. Tilda Burrow hiatal hernia, normal esophagus, normal stomach  . ESOPHAGOGASTRODUODENOSCOPY  02/12/2004   Dr. Dudley Major- normal   . ESOPHAGOGASTRODUODENOSCOPY N/A 05/09/2015   Procedure: ESOPHAGOGASTRODUODENOSCOPY (EGD);  Surgeon: Daneil Dolin, MD;  [facility];  Service: Endoscopy;  Laterality: N/A;  . ESOPHAGOGASTRODUODENOSCOPY (EGD) WITH PROPOFOL N/A 11/10/2017   Procedure: ESOPHAGOGASTRODUODENOSCOPY (EGD) WITH PROPOFOL;  Surgeon: Daneil Dolin, MD;  Location[facility];  Service: Endoscopy;  Laterality: N/A;  9:45am  . GIVENS CAPSULE STUDY N/A 06/03/2015   Procedure: GIVENS CAPSULE STUDY;  Surgeon: Daneil Dolin, MD;  Location: [facility];  Service: Endoscopy;  Laterality: N/A;  0700  . HOT HEMOSTASIS  11/10/2017   Procedure: HOT  HEMOSTASIS (ARGON PLASMA COAGULATION/BICAP);  Surgeon: Daneil Dolin, MD;  Location[facility];  Service: Endoscopy;;  rectum  . KNEE SURGERY     left knee   . LEFT HEART CATHETERIZATION WITH CORONARY ANGIOGRAM N/A 06/17/2014   Procedure: LEFT HEART CATHETERIZATION WITH CORONARY ANGIOGRAM;  Surgeon: Burnell Blanks, MD;  Location: [facility] C[facility] LAB;  Service: Cardiovascular;  Laterality: N/A;  . PARTIAL COLECTOMY  02/29/2012   Procedure: PARTIAL COLECTOMY;  Surgeon: Adin Hector, MD;  Location: [facility];  Service: General;  Laterality: Right;  . POLYPECTOMY  11/10/2017   Procedure: POLYPECTOMY;  Surgeon: Daneil Dolin, MD;  Location[facility];  Service: Endoscopy;;  gastric colon  . PROSTATECTOMY    . TONSILLECTOMY    . TRANSURETHRAL RESECTION OF PROSTATE    . VASECTOMY       SOCIAL HISTORY:  Social History   Socioeconomic History  . Marital status: Married    Spouse name: Not on file  . Number of children: 3  . 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 Highest education level: Not on  file  Occupational History  . 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 Financial resource strain: Not on file  . Food insecurity:    Worry: Not on file    Inability: Not on file  . Transportation needs:    Medical: Not on file    Non-medical: Not on file  Tobacco Use  . Smoking status: Never Smoker  . Smokeless tobacco: Never Used  Substance and Sexual Activity  . Alcohol use: Yes    Comment: 1 cocktail/month  . Drug use: No  . Sexual activity: Yes    Birth control/protection: None  Lifestyle  . Physical activity:    Days per week: Not on file    Minutes per session: Not on file  . Stress: Not on file  Relationships  . Social connections:    Talks on phone: Not on file    Gets together: Not on file    Attends religious service: Not on file    Active member of club or organization: Not on file    Attends meetings of clubs or organizations: Not on file     Relationship status: Not on file  . Intimate partner violence:    Fear of current or ex partner: Not on file    Emotionally abused: Not on file    Physically abused: Not on file    Forced sexual activity: Not on file  Other Topics Concern  . Not on file  Social History Narrative  . Not on file    FAMILY HISTORY:  Family History  Problem Relation Age of Onset  . Throat cancer Father        dx in his 64s and again in his 34s; pipe and cigar smoker  . Neuropathy Brother   . Prostate cancer Brother 23  . Melanoma Brother        dx in his 52s  . Cancer Paternal Uncle        smoking related cancer  . Heart attack Paternal Grandfather   . Stomach cancer Cousin 37  . Colon cancer Neg Hx     CURRENT MEDICATIONS:  Outpatient Encounter Medications as of 12/26/2018  Medication Sig Note  . ALPRAZolam (XANAX) 0.5 MG tablet Take 0.5 mg by mouth daily as needed for anxiety. 10/26/2017: Primarily uses this when fly  . aspirin EC 81 MG tablet Take 81 mg by mouth daily.   . B Complex Vitamins (VITAMIN B COMPLEX PO) Take 1 tablet by mouth at bedtime.    . cetirizine (ZYRTEC) 10 MG tablet Take 10 mg by mouth daily.   . cholecalciferol (VITAMIN D) 400 units TABS tablet Take 400 Units by mouth daily.   . citalopram (CELEXA) 20 MG tablet Take 20 mg by mouth daily.   . clopidogrel (PLAVIX) 75 MG tablet TAKE (1) TABLET BY MOUTH ONCE DAILY.   . folic acid (FOLVITE) 1 MG tablet TAKE (1) TABLET BY MOUTH ONCE DAILY . (Patient taking differently: Take 1 mg by mouth once a week. )   . hydrocortisone (ANUSOL-HC) 2.5 % rectal cream Place 1 application rectally 3 (three) times daily. (Patient taking differently: Place 1 application rectally as needed. )   . ibuprofen (ADVIL,MOTRIN) 200 MG tablet Take 400 mg by mouth daily as needed for headache or moderate pain.   Marland Kitchen latanoprost (XALATAN) 0.005 % ophthalmic solution Place 1 drop into both eyes at bedtime.    Marland Kitchen loperamide (IMODIUM) 2 MG capsule Take 2 mg by mouth  as needed for diarrhea or loose stools.   Marland Kitchen losartan (COZAAR) 50 MG tablet Take 25 mg by mouth 2 (two) times daily.    . metoprolol (LOPRESSOR) 50 MG tablet Take 50 mg by mouth 2 (two) times daily.    . montelukast (SINGULAIR) 10 MG tablet Take 10 mg by mouth at bedtime.   . Multiple Vitamin (MULTIVITAMIN WITH MINERALS) TABS tablet Take 1 tablet by mouth daily.   . nitroGLYCERIN (NITROSTAT) 0.4 MG SL tablet PLACE ONE (1) TABLET UNDER TONGUE EVERY 5 MINUTES UP TO (3) DOSES AS NEEDED FOR CHEST PAIN.   Marland Kitchen ofloxacin (OCUFLOX) 0.3 % ophthalmic solution Place 1 drop into both eyes as needed.    . pantoprazole (PROTONIX) 40 MG tablet Take 40 mg by mouth daily.   . pravastatin (PRAVACHOL) 40 MG tablet TAKE 1 TABLET BY MOUTH AT BEDTIME.   Marland Kitchen saccharomyces boulardii (FLORASTOR) 250 MG capsule Take 250 mg by mouth daily.    . vitamin B-12 (CYANOCOBALAMIN) 1000 MCG tablet Take by mouth.   . Vitamins/Minerals TABS Take 1 tablet by mouth daily.    . Wheat Dextrin (BENEFIBER) POWD Take by mouth as needed. Daily in the AM    . meloxicam (MOBIC) 15 MG tablet Take 15 mg by mouth daily.   . [DISCONTINUED] isosorbide mononitrate (IMDUR) 30 MG 24 hr tablet isosorbide mononitrate ER 30 mg tablet,extended release 24 hr    No facility-administered encounter medications on file as of 12/26/2018.     ALLERGIES:  Allergies  Allergen Reactions  . Shellfish Allergy Anaphylaxis  . Sulfonamide Derivatives Diarrhea and Nausea Only     PHYSICAL EXAM:  ECOG Performance status: 1  Vitals:   12/26/18 1147  BP: (!) 151/70  Pulse: 61  Resp: 18  Temp: 98.1 F (36.7 C)  SpO2: 99%   Filed Weights   12/26/18 1147  Weight: 221 lb (100.2 kg)    Physical Exam Constitutional:      Appearance: Normal appearance. He is normal weight.  Cardiovascular:     Rate and Rhythm: Normal rate and regular rhythm.     Heart sounds: Normal heart sounds.  Pulmonary:     Effort: Pulmonary effort is normal.     Breath sounds:  Normal breath sounds.  Abdominal:     General: Bowel sounds are normal.     Palpations: Abdomen is soft.  Musculoskeletal: Normal range of motion.  Skin:    General: Skin is warm and dry.  Neurological:     Mental Status: He is alert and oriented to person, place, and time. Mental status is at baseline.  Psychiatric:        Mood and Affect: Mood normal.        Behavior: Behavior normal.        Thought Content: Thought content normal.        Judgment: Judgment normal.      LABORATORY DATA:  I have reviewed the labs as listed.  CBC    Component Value Date/Time   WBC 15.7 (H) 12/19/2018 1211   RBC 4.80 12/19/2018 1211   HGB 14.8 12/19/2018 1211   HCT 45.6 12/19/2018 1211   PLT 359 12/19/2018 1211   MCV 95.0 12/19/2018 1211   MCH 30.8 12/19/2018 1211   MCHC 32.5 12/19/2018 1211   RDW 12.5 12/19/2018 1211   LYMPHSABS 6.8 (H) 12/19/2018 1211   MONOABS 1.2 (H) 12/19/2018 1211   EOSABS 0.5 12/19/2018 1211   BASOSABS 0.1 12/19/2018 1211   CMP Latest  Ref Rng & Units 12/19/2018 06/06/2018 12/12/2017  Glucose 70 - 99 mg/dL 90 96 129(H)  BUN 8 - 23 mg/dL 16 18 13   Creatinine 0.61 - 1.24 mg/dL 1.15 1.41(H) 1.12  Sodium 135 - 145 mmol/L 138 142 140  Potassium 3.5 - 5.1 mmol/L 4.4 4.4 4.5  Chloride 98 - 111 mmol/L 107 109 107  CO2 22 - 32 mmol/L 25 26 24   Calcium 8.9 - 10.3 mg/dL 8.8(L) 9.2 8.9  Total Protein 6.5 - 8.1 g/dL 6.7 7.0 6.8  Total Bilirubin 0.3 - 1.2 mg/dL 0.9 1.0 0.8  Alkaline Phos 38 - 126 U/L 54 58 57  AST 15 - 41 U/L 22 19 25   ALT 0 - 44 U/L 20 21 20     I personally performed a face-to-face visit.  All questions were answered to patient's stated satisfaction. Encouraged patient to call with any new concerns or questions before his next visit to the cancer center and we can certain see him sooner, if needed.     ASSESSMENT & PLAN:   CLL (chronic lymphocytic leukemia) ([facility]) 1.  Stage 0 CLL: - He was diagnosed by flow on 12/13/2014 - Today's examination did not  reveal any palpable adenopathy or splenomegaly. -She denies any recurrent infections or hospitalizations. -Labs on 12/19/2018 showed his WBC at 18.7, hemoglobin was 14.8, and platelets were 359. -We will see him back in 6 months for follow-up and repeat blood work and physical exam.  2.  Stage III ascending colon cancer: - He had 2 out of 20 lymph nodes positive. -Status post right hemicolectomy on 02/29/2012, could not tolerate adjuvant Xeloda. - EGD showed multiple benign gastric polyps, otherwise within normal limits. -He is completed 5 years of surveillance.  No more scans needed at this time. -He had a colonoscopy on 11/10/2017 which showed radiation proctitis changes.  Diverticulosis in the sigmoid colon.  One polyp in the ascending colon which was removed - Labs on 12/19/2018 showed his CEA level at 1.8. - We will continue to follow his CEA levels and do scans if disease warrants.  3.  Prostate cancer: -Underwent radiation therapy at [facility] long hospital in 2011. -He also underwent a TURP x2. - Labs on 12/19/2018 showed his PSA level at 0.14. -He follows up with Dr. [place] Crane his urologist in [facility]. -We will continue to monitor PSA levels.      Orders placed this encounter:  Orders Placed This Encounter  Procedures  . PSA  . CEA  . Lactate dehydrogenase  . CBC with Differential/Platelet  . Comprehensive metabolic panel  . Vitamin B12  . VITAMIN D 25 Hydroxy (Vit-D Deficiency, Fractures)      Natale Lay, FNP-C [place] [phone]

## 2018-12-26 NOTE — Assessment & Plan Note (Signed)
1.  Stage 0 CLL: - He was diagnosed by flow on 12/13/2014 - Today's examination did not reveal any palpable adenopathy or splenomegaly. -She denies any recurrent infections or hospitalizations. -Labs on 12/19/2018 showed his WBC at 18.7, hemoglobin was 14.8, and platelets were 359. -We will see him back in 6 months for follow-up and repeat blood work and physical exam.  2.  Stage III ascending colon cancer: - He had 2 out of 20 lymph nodes positive. -Status post right hemicolectomy on 02/29/2012, could not tolerate adjuvant Xeloda. - EGD showed multiple benign gastric polyps, otherwise within normal limits. -He is completed 5 years of surveillance.  No more scans needed at this time. -He had a colonoscopy on 11/10/2017 which showed radiation proctitis changes.  Diverticulosis in the sigmoid colon.  One polyp in the ascending colon which was removed - Labs on 12/19/2018 showed his CEA level at 1.8. - We will continue to follow his CEA levels and do scans if disease warrants.  3.  Prostate cancer: -Underwent radiation therapy at [facility] long hospital in 2011. -He also underwent a TURP x2. - Labs on 12/19/2018 showed his PSA level at 0.14. -He follows up with Dr. [place] Crane his urologist in [facility]. -We will continue to monitor PSA levels.

## 2018-12-26 NOTE — Patient Instructions (Signed)
[facility] Discharge Instructions  Follow-up in 6 months with repeat labs.   Thank you for choosing [facility] at [facility] to provide your oncology and hematology care.  To afford each patient quality time with our provider, please arrive at least 15 minutes before your scheduled appointment time.   If you have a lab appointment with the Cancer Center please come in thru the  Main Entrance and check in at the main information desk  You need to re-schedule your appointment should you arrive 10 or more minutes late.  We strive to give you quality time with our providers, and arriving late affects you and other patients whose appointments are after yours.  Also, if you no show three or more times for appointments you may be dismissed from the clinic at the providers discretion.     Again, thank you for choosing [facility].  Our hope is that these requests will decrease the amount of time that you wait before being seen by our physicians.       _____________________________________________________________  Should you have questions after your visit to [facility], please contact our office at [phone] between the hours of 8:00 a.m. and 4:30 p.m.  Voicemails left after 4:00 p.m. will not be returned until the following business day.  For prescription refill requests, have your pharmacy contact our office and allow 72 hours.    Cancer Center Support Programs:   > Cancer Support Group  2nd Tuesday of the month 1pm-2pm, Journey Room    

## 2019-01-03 ENCOUNTER — Telehealth: Payer: Self-pay | Admitting: Cardiology

## 2019-01-03 NOTE — Telephone Encounter (Signed)
Spoke with pt and notified pt that we are currently seeing pts in office at this time but would like to change his appt to a virtual visit. Pt was in agreement and voiced understanding.     Virtual Visit Pre-Appointment Phone Call  "(Name), I am calling you today to discuss your upcoming appointment. We are currently trying to limit exposure to the virus that causes COVID-19 by seeing patients at home rather than in the office."  1. "What is the BEST phone number to call the day of the visit?" - include this in appointment notes  2. "Do you have or have access to (through a family member/friend) a smartphone with video capability that we can use for your visit?" a. If yes - list this number in appt notes as "cell" (if different from BEST phone #) and list the appointment type as a VIDEO visit in appointment notes b. If no - list the appointment type as a PHONE visit in appointment notes  3. Confirm consent - "In the setting of the current Covid19 crisis, you are scheduled for a (phone or video) visit with your provider on (date) at (time).  Just as we do with many in-office visits, in order for you to participate in this visit, we must obtain consent.  If you'd like, I can send this to your mychart (if signed up) or email for you to review.  Otherwise, I can obtain your verbal consent now.  All virtual visits are billed to your insurance company just like a normal visit would be.  By agreeing to a virtual visit, we'd like you to understand that the technology does not allow for your provider to perform an examination, and thus may limit your provider's ability to fully assess your condition. If your provider identifies any concerns that need to be evaluated in person, we will make arrangements to do so.  Finally, though the technology is pretty good, we cannot assure that it will always work on either your or our end, and in the setting of a video visit, we may have to convert it to a phone-only visit.   In either situation, we cannot ensure that we have a secure connection.  Are you willing to proceed?" STAFF: Did the patient verbally acknowledge consent to telehealth visit? Document YES/NO here: Yes  4. Advise patient to be prepared - "Two hours prior to your appointment, go ahead and check your blood pressure, pulse, oxygen saturation, and your weight (if you have the equipment to check those) and write them all down. When your visit starts, your provider will ask you for this information. If you have an Apple Watch or Kardia device, please plan to have heart rate information ready on the day of your appointment. Please have a pen and paper handy nearby the day of the visit as well."  5. Give patient instructions for MyChart download to smartphone OR Doximity/Doxy.me as below if video visit (depending on what platform provider is using)  6. Inform patient they will receive a phone call 15 minutes prior to their appointment time (may be from unknown caller ID) so they should be prepared to answer    Cokato Hottinger Jr. has been deemed a candidate for a follow-up tele-health visit to limit community exposure during the Covid-19 pandemic. I spoke with the patient via phone to ensure availability of phone/video source, confirm preferred email & phone number, and discuss instructions and expectations.  I reminded David Eugene  Sheryle Pugh. to be prepared with any vital sign and/or heart rhythm information that could potentially be obtained via home monitoring, at the time of his visit. I reminded David Pugh. to expect a phone call prior to his visit.  Lawrence Santiago, LPN 09/04/1094 04:54 AM   INSTRUCTIONS FOR DOWNLOADING THE MYCHART APP TO SMARTPHONE  - The patient must first make sure to have activated MyChart and know their login information - If Apple, go to [employer] and type in MyChart in the search bar and download the app. If Android, ask patient to go to [employer] and type in [place] in the search bar and download the app. The app is free but as with any other app downloads, their phone may require them to verify saved payment information or Apple/Android password.  - The patient will need to then log into the app with their MyChart username and password, and select [facility] as their healthcare provider to link the account. When it is time for your visit, go to the MyChart app, find appointments, and click Begin Video Visit. Be sure to Select Allow for your device to access the Microphone and Camera for your visit. You will then be connected, and your provider will be with you shortly.  **If they have any issues connecting, or need assistance please contact MyChart service desk [phone])**  **If using a computer, in order to ensure the best quality for their visit they will need to use either of the following Internet Browsers: [employer], or Google Chrome**  IF USING DOXIMITY or DOXY.ME - The patient will receive a link just prior to their visit by text.     FULL LENGTH CONSENT FOR TELE-HEALTH VISIT   I hereby voluntarily request, consent and authorize [facility] and its employed or contracted physicians, physician assistants, nurse practitioners or other licensed health care professionals (the Practitioner), to provide me with telemedicine health care services (the "Services") as deemed necessary by the treating Practitioner. I acknowledge and consent to receive the Services by the Practitioner via telemedicine. I understand that the telemedicine visit will involve communicating with the Practitioner through live audiovisual communication technology and the disclosure of certain medical information by electronic transmission. I acknowledge that I have been given the opportunity to request an in-person assessment or other available alternative prior to the telemedicine visit and am voluntarily participating in the  telemedicine visit.  I understand that I have the right to withhold or withdraw my consent to the use of telemedicine in the course of my care at any time, without affecting my right to future care or treatment, and that the Practitioner or I may terminate the telemedicine visit at any time. I understand that I have the right to inspect all information obtained and/or recorded in the course of the telemedicine visit and may receive copies of available information for a reasonable fee.  I understand that some of the potential risks of receiving the Services via telemedicine include:  Marland Kitchen Delay or interruption in medical evaluation due to technological equipment failure or disruption; . Information transmitted may not be sufficient (e.g. poor resolution of images) to allow for appropriate medical decision making by the Practitioner; and/or  . In rare instances, security protocols could fail, causing a breach of personal health information.  Furthermore, I acknowledge that it is my responsibility to provide information about my medical history, conditions and care that is complete and accurate to the best of my ability.  I acknowledge that Practitioner's advice, recommendations, and/or decision may be based on factors not within their control, such as incomplete or inaccurate data provided by me or distortions of diagnostic images or specimens that may result from electronic transmissions. I understand that the practice of medicine is not an exact science and that Practitioner makes no warranties or guarantees regarding treatment outcomes. I acknowledge that I will receive a copy of this consent concurrently upon execution via email to the email address I last provided but may also request a printed copy by calling the office of [facility].    I understand that my insurance will be billed for this visit.   I have read or had this consent read to me. . I understand the contents of this consent, which  adequately explains the benefits and risks of the Services being provided via telemedicine.  . I have been provided ample opportunity to ask questions regarding this consent and the Services and have had my questions answered to my satisfaction. . I give my informed consent for the services to be provided through the use of telemedicine in my medical care  By participating in this telemedicine visit I agree to the above.

## 2019-01-03 NOTE — Telephone Encounter (Signed)
Questioning about home health nurse telling him just now that he has PAD?  Has up coming appointment he wanted to know if he needed to be seen in office

## 2019-01-09 ENCOUNTER — Encounter: Payer: Self-pay | Admitting: Cardiology

## 2019-01-09 NOTE — Progress Notes (Signed)
Virtual Visit via Telephone Note   This visit type was conducted due to national recommendations for restrictions regarding the COVID-19 Pandemic (e.g. social distancing) in an effort to limit this patient's exposure and mitigate transmission in our community.  Due to his co-morbid illnesses, this patient is at least at moderate risk for complications without adequate follow up.  This format is felt to be most appropriate for this patient at this time.  The patient did not have access to video technology/had technical difficulties with video requiring transitioning to audio format only (telephone).  All issues noted in this document were discussed and addressed.  No physical exam could be performed with this format.  Please refer to the patient's chart for his  consent to telehealth for [facility].   Date:  01/10/2019   ID:  David Ades., DOB 09/04/1934, MRN [id number]  Patient Location: Home Provider Location: Office  PCP:  Sandi Mealy, MD  Cardiologist:  Rozann Lesches, MD  Evaluation Performed:  Follow-Up Visit  Chief Complaint:   Cardiac follow-up  History of Present Illness:    David Rodas. is an 84 y.o. male former patient of Dr. Sallyanne Kuster last seen by Ms. Lawrence DNP in November 2019.  He is establishing follow-up in the [facility] practice.  I reviewed his records and updated the chart.  Not have video access today and we spoke by phone.  From a cardiac perspective he indicates doing well, no angina symptoms or nitroglycerin use.  He exercises on elliptical machine for 30 minutes, 5 days a week.  He reports NYHA class II dyspnea, no palpitations or syncope.  He has been told that he has PAD based on prior examinations, details not clear.  He does follow-up with the [place] system, and also had a [place] [facility] [place] home check earlier in the year.  He was told that this affects his right leg predominantly.  He does not however report any obvious  angina symptoms, no slowly healing wounds or ulcerations.  He does have chronic bilateral knee arthritis.  He celebrated his 84nd birthday yesterday.  He states that he has been social distancing, wears a mask when he goes out.  I reviewed his medications which are outlined below and stable from a cardiac perspective.  He reports no obvious intolerances.  He has not had a recent lipid panel since 2018.  Cardiac catheterization and PCI from 2015 is outlined below.   The patient does not have symptoms concerning for COVID-19 infection (fever, chills, cough, or new shortness of breath).    Past Medical History:  Diagnosis Date   Abnormality of gait 12/18/2013   Arthritis    Biliary dyskinesia    CAD (coronary artery disease) 2012   a. DES x2 in ramus/OM and mid LAD in 2012  b. balloon angioplaty of OM 2015   CLL (chronic lymphocytic leukemia) ([place])    Colon cancer ([place]) 2013   Right hemicolectomy, did not tolerate chemotherapy   Colon polyps    tubular adenomas   Diverticula of colon 2010   Essential hypertension    Exposure to Agent Orange    Folic acid deficiency    Gastroparesis 2014   GERD (gastroesophageal reflux disease)    Glaucoma    Heart palpitations 2014   Hiatal hernia 2010   History of anemia as a child    History of kidney stones    Iron deficiency 2013   Myocardial infarction [facility]) 2012  Peripheral neuropathy    Prostate cancer ([place]) 2010   Radiation, surgery   PTSD (post-traumatic stress disorder)    Radiation proctitis    Rosacea    Past Surgical History:  Procedure Laterality Date   CARDIAC CATHETERIZATION  02/13/2011   [facility], [facility] cardiology   CATARACT EXTRACTION     2011   CATARACT EXTRACTION W/PHACO Right 08/06/2013   Procedure: CATARACT EXTRACTION PHACO AND INTRAOCULAR LENS PLACEMENT ([place]);  Surgeon: Tonny Branch, MD;  Location: [facility] ORS;  Service: Ophthalmology;  Laterality: Right;  CDE:20.96   CHOLECYSTECTOMY       COLONOSCOPY  02/12/2004   Dr. Gala Romney- L side diverticula, inflammatory  colon polyps   COLONOSCOPY  01/19/2012   RMR: Prominent changes involving the rectal mucosa consistent with radiation-induced proctitis. Multiple colonic  polyps removed as described above. Sigmoid Diverticulosis/ 1.5 x 2 cm relatively flat ulcerated lesion in cecum/path showed adenocarcinoma of the colon. descending colon polyp with tubular adenoma and one fragment of focal high grade dysplasia.    COLONOSCOPY  08/28/2012   NOB:[id number] proctitis. Colonic diverticulosis/Ulcerations at the surgical anastomosis  path: ulcerated colonic mucosa with prolapse changes, tubular adenoma.    COLONOSCOPY N/A 08/02/2013   RMR: Colonic polyps -removed as described above. Colonic diverticulosis. Status post reight hemicolectomy. Radiation proctitis- asymptomatic.    COLONOSCOPY N/A 05/09/2015   Procedure: COLONOSCOPY;  Surgeon: Daneil Dolin, MD;  Location: [facility] END[facility];  Service: Endoscopy;  Laterality: N/A;  1200-moved to [address] [place] to notify pt   COLONOSCOPY WITH PROPOFOL N/A 11/10/2017   Procedure: COLONOSCOPY WITH PROPOFOL;  Surgeon: Daneil Dolin, MD;  Location: [facility];  Service: Endoscopy;  Laterality: N/A;   CORONARY ANGIOPLASTY  06/17/14   OM1 POBA   CORONARY STENT PLACEMENT  01/2011   2.5x54mm Xience drug-eluting stent in ramus intermedius/OMI vessel, 2.5x66mm Promus Element stent in mid LAD artery   ESOPHAGOGASTRODUODENOSCOPY  10/10/2008   Dr. Tilda Burrow hiatal hernia, normal esophagus, normal stomach   ESOPHAGOGASTRODUODENOSCOPY  02/12/2004   Dr. Dudley Major- normal    ESOPHAGOGASTRODUODENOSCOPY N/A 05/09/2015   Procedure: ESOPHAGOGASTRODUODENOSCOPY (EGD);  Surgeon: Daneil Dolin, MD;  [facility];  Service: Endoscopy;  Laterality: N/A;   ESOPHAGOGASTRODUODENOSCOPY (EGD) WITH PROPOFOL N/A 11/10/2017   Procedure: ESOPHAGOGASTRODUODENOSCOPY (EGD) WITH PROPOFOL;  Surgeon: Daneil Dolin, MD;  Location: [facility]  END[facility] SU[facility];  Service: Endoscopy;  Laterality: N/A;  9:45am   GIVENS CAPSULE STUDY N/A 06/03/2015   Procedure: GIVENS CAPSULE STUDY;  Surgeon: Daneil Dolin, MD;  [facility];  Service: Endoscopy;  Laterality: N/A;  0700   HOT HEMOSTASIS  11/10/2017   Procedure: HOT HEMOSTASIS (ARGON PLASMA COAGULATION/BICAP);  Surgeon: Daneil Dolin, MD;  [facility];  Service: Endoscopy;;  rectum   KNEE SURGERY     left knee    LEFT HEART CATHETERIZATION WITH CORONARY ANGIOGRAM N/A 06/17/2014   Procedure: LEFT HEART CATHETERIZATION WITH CORONARY ANGIOGRAM;  Surgeon: Burnell Blanks, MD;  [facility] C[facility] LAB;  Service: Cardiovascular;  Laterality: N/A;   PARTIAL COLECTOMY  02/29/2012   Procedure: PARTIAL COLECTOMY;  Surgeon: Adin Hector, MD;  Location: [facility];  Service: General;  Laterality: Right;   POLYPECTOMY  11/10/2017   Procedure: POLYPECTOMY;  Surgeon: Daneil Dolin, MD;  Location: [facility];  Service: Endoscopy;;  gastric colon   PROSTATECTOMY     TONSILLECTOMY     TRANSURETHRAL RESECTION OF PROSTATE     VASECTOMY  Current Meds  Medication Sig   ALPRAZolam (XANAX) 0.5 MG tablet Take 0.5 mg by mouth daily as needed for anxiety.   aspirin EC 81 MG tablet Take 81 mg by mouth daily.   B Complex Vitamins (VITAMIN B COMPLEX PO) Take 1 tablet by mouth at bedtime.    cetirizine (ZYRTEC) 10 MG tablet Take 10 mg by mouth daily.   cholecalciferol (VITAMIN D) 400 units TABS tablet Take 400 Units by mouth daily.   citalopram (CELEXA) 20 MG tablet Take 20 mg by mouth daily.   clopidogrel (PLAVIX) 75 MG tablet TAKE (1) TABLET BY MOUTH ONCE DAILY.   folic acid (FOLVITE) 1 MG tablet TAKE (1) TABLET BY MOUTH ONCE DAILY . (Patient taking differently: Take 1 mg by mouth once a week. )   hydrocortisone (ANUSOL-HC) 2.5 % rectal cream Place 1 application rectally 3 (three) times daily. (Patient taking differently: Place 1 application rectally as needed.  )   ibuprofen (ADVIL,MOTRIN) 200 MG tablet Take 400 mg by mouth daily as needed for headache or moderate pain.   latanoprost (XALATAN) 0.005 % ophthalmic solution Place 1 drop into both eyes at bedtime.    loperamide (IMODIUM) 2 MG capsule Take 2 mg by mouth as needed for diarrhea or loose stools.   losartan (COZAAR) 50 MG tablet Take 25 mg by mouth 2 (two) times daily.    meloxicam (MOBIC) 15 MG tablet Take 15 mg by mouth daily.   metoprolol (LOPRESSOR) 50 MG tablet Take 50 mg by mouth 2 (two) times daily.    montelukast (SINGULAIR) 10 MG tablet Take 10 mg by mouth at bedtime.   Multiple Vitamin (MULTIVITAMIN WITH MINERALS) TABS tablet Take 1 tablet by mouth daily.   nitroGLYCERIN (NITROSTAT) 0.4 MG SL tablet PLACE ONE (1) TABLET UNDER TONGUE EVERY 5 MINUTES UP TO (3) DOSES AS NEEDED FOR CHEST PAIN.   ofloxacin (OCUFLOX) 0.3 % ophthalmic solution Place 1 drop into both eyes as needed.    pantoprazole (PROTONIX) 40 MG tablet Take 40 mg by mouth daily.   pravastatin (PRAVACHOL) 40 MG tablet TAKE 1 TABLET BY MOUTH AT BEDTIME.   saccharomyces boulardii (FLORASTOR) 250 MG capsule Take 250 mg by mouth daily.    vitamin B-12 (CYANOCOBALAMIN) 1000 MCG tablet Take by mouth.   Vitamins/Minerals TABS Take 1 tablet by mouth daily.    Wheat Dextrin (BENEFIBER) POWD Take by mouth as needed. Daily in the AM      Allergies:   Shellfish allergy and Sulfonamide derivatives   Social History   Tobacco Use   Smoking status: Never Smoker   Smokeless tobacco: Never Used  Substance Use Topics   Alcohol use: Yes    Comment: 1 cocktail/month   Drug use: No     Family Hx: The patient's family history includes Cancer in his paternal uncle; Heart attack in his paternal grandfather; Melanoma in his brother; Neuropathy in his brother; Prostate cancer (age of onset: 68) in his brother; Stomach cancer (age of onset: 32) in his cousin; Throat cancer in his father. There is no history of Colon  cancer.  ROS:   Please see the history of present illness.    All other systems reviewed and are negative.   Prior CV studies:   The following studies were reviewed today:  Cardiac catheterization and PCI 06/17/2014: PCI Note: He was given an additional 6000 units IV heparin. ACT was 235. He was then given an additional 3000 units IV heparin. ACT was over 280. I then  engaged the left main with a XB 3.0 guiding catheter. I passed a Cougar IC wire down the Circumflex into the OM1. I then dilated the long segment of stenosis in OM1 with a 2.0 x 20 mm balloon x 2. I was unable to deliver a small stent despite attempting this with a buddy wire. I used a FineCross catheter to shuttle in a long Mailman wire but still could not deliver the stent. I was unable to successfully deliver a Guideliner for guide support. The case was terminated with an acceptable balloon angioplasty result. The stenosis was taken from 99% down to 30%. There was excellent flow down the vessel.   The sheath was removed from the right radial artery and a Terumo hemostasis band was applied at the arteriotomy site on the right wrist.    There were no immediate complications. The patient was taken to the recovery area in stable condition.   Hemodynamic Findings: Central aortic pressure: 125/76 Left ventricular pressure: 127/14/17  Angiographic Findings:  Left main: No obstructive disease.   Left Anterior Descending Artery: Moderate to large caliber vessel that courses to the apex. The proximal vessel has mild disease. The mid vessel has diffuse 30% stenosis followed by a patent stented segment with no restenosis. The apical LAD becomes very small in caliber with diffuse 80% stenosis. Small caliber bifurcating diagonal branch with diffuse 30% stenosis.   Circumflex Artery: Small to moderate caliber diffusely diseased vessel with proximal 30% stenosis. There is a small caliber (2.0) obtuse marginal branch with proximal  50% stenosis followed by 99% focal stenosis. The AV groove Circumflex is small in caliber beyond the OM branch and has mild plaque disease.   Intermediate Branch: Moderate caliber vessel with patent proximal stent with minimal restenosis.   Right Coronary Artery: Large dominant vessel with 30% proximal stenosis, diffuse 40% mid stenosis, 50% focal distal stenosis. The posterolateral branch is a small caliber vessel with 99% distal stenosis (too small for PCI).   Left Ventricular Angiogram: Deferred.   Impression: 1. Triple vessel CAD 2. Patent stents in the mid LAD and proximal intermediate branch 3. Severe stenosis in the small caliber OM branch, s/p balloon angioplasty (unable to deliver a stent due to takeoff of Circumflex from left main, size of the vessel and tortuosity)  Labs/Other Tests and Data Reviewed:    EKG:  An ECG dated 07/12/2018 was personally reviewed today and demonstrated:  Sinus rhythm with nonspecific T wave changes.  Recent Labs: 12/19/2018: ALT 20; BUN 16; Creatinine, Ser 1.15; Hemoglobin 14.8; Platelets 359; Potassium 4.4; Sodium 138   Recent Lipid Panel Lab Results  Component Value Date/Time   CHOL 133 04/27/2017 09:08 AM   TRIG 106 04/27/2017 09:08 AM   HDL 63 04/27/2017 09:08 AM   CHOLHDL 2.1 04/27/2017 09:08 AM   CHOLHDL 1.7 05/05/2016 08:40 AM   LDLCALC 49 04/27/2017 09:08 AM    Wt Readings from Last 3 Encounters:  01/10/19 222 lb (100.7 kg)  12/26/18 221 lb (100.2 kg)  07/12/18 221 lb 6.4 oz (100.4 kg)     Objective:    Vital Signs:  BP 114/80    Pulse 66    Ht 5\' 11"  (1.803 m)    Wt 222 lb (100.7 kg)    BMI 30.96 kg/m    Patient spoke in full sentences by phone, not short of breath. Voice tone and speech pattern were normal. No audible wheezing.  ASSESSMENT & PLAN:    1.  CAD with history of  DES x2 to the ramus intermedius/obtuse marginal and mid LAD in 2012 with subsequent balloon angioplasty of the OM in 2015.  He does not report any  active angina at this time on medical therapy, no recent nitroglycerin use.  Medical therapy overall looks good.  Vital signs also stable today.  No clear indication for follow-up ischemic testing at this time.  He reports good exercise tolerance.  2.  Mixed hyperlipidemia, he continues on Pravachol.  Follow-up fasting lipid panel with next routine lab work this year.  3.  Reported peripheral arterial disease, possibly involving the right leg although he does not indicate any obvious claudication at this time.  We will obtain lower extremity arterial Dopplers and ABIs for objective assessment.  Otherwise continue aspirin and statin.  4.  Essential hypertension by history, blood pressure is normal today.  COVID-19 Education: The signs and symptoms of COVID-19 were discussed with the patient and how to seek care for testing (follow up with PCP or arrange E-visit).  The importance of social distancing was discussed today.  Time:   Today, I have spent 12 minutes with the patient with telehealth technology discussing the above problems.     Medication Adjustments/Labs and Tests Ordered: Current medicines are reviewed at length with the patient today.  Concerns regarding medicines are outlined above.   Tests Ordered: Orders Placed This Encounter  Procedures   Lipid panel    Medication Changes: No orders of the defined types were placed in this encounter.   Disposition:  Follow up 6 months in [place] office.  Signed, Rozann Lesches, MD  01/10/2019 9:10 AM    [place]

## 2019-01-10 ENCOUNTER — Encounter: Payer: Self-pay | Admitting: Cardiology

## 2019-01-10 ENCOUNTER — Telehealth (INDEPENDENT_AMBULATORY_CARE_PROVIDER_SITE_OTHER): Payer: Medicare Other | Admitting: Cardiology

## 2019-01-10 ENCOUNTER — Other Ambulatory Visit: Payer: Self-pay | Admitting: Cardiovascular Disease

## 2019-01-10 VITALS — BP 114/80 | HR 66 | Ht 71.0 in | Wt 222.0 lb

## 2019-01-10 DIAGNOSIS — E782 Mixed hyperlipidemia: Secondary | ICD-10-CM | POA: Diagnosis not present

## 2019-01-10 DIAGNOSIS — I10 Essential (primary) hypertension: Secondary | ICD-10-CM

## 2019-01-10 DIAGNOSIS — I739 Peripheral vascular disease, unspecified: Secondary | ICD-10-CM

## 2019-01-10 DIAGNOSIS — I25119 Atherosclerotic heart disease of native coronary artery with unspecified angina pectoris: Secondary | ICD-10-CM

## 2019-01-10 DIAGNOSIS — Z7189 Other specified counseling: Secondary | ICD-10-CM

## 2019-01-10 NOTE — Patient Instructions (Addendum)
Medication Instructions:   Your physician recommends that you continue on your current medications as directed. Please refer to the Current Medication list given to you today.  Labwork:  Your physician recommends that you return for a FASTING lipid profile: October 2020. Please do not eat or drink for at least 8 hours when you have this done.  Testing/Procedures:  Your physician has requested that you have a lower extremity arterial exercise duplex. During this test, exercise and ultrasound are used to evaluate arterial blood flow in the legs. Allow one hour for this exam. There are no restrictions or special instructions.  Follow-Up:  Your physician recommends that you schedule a follow-up appointment in: 6 months. You will receive a reminder letter in the mail in about 4 months reminding you to call and schedule your appointment. If you don't receive this letter, please contact our office.  Any Other Special Instructions Will Be Listed Below (If Applicable).  If you need a refill on your cardiac medications before your next appointment, please call your pharmacy.

## 2019-01-11 ENCOUNTER — Other Ambulatory Visit: Payer: Self-pay | Admitting: Cardiology

## 2019-01-11 DIAGNOSIS — I739 Peripheral vascular disease, unspecified: Secondary | ICD-10-CM

## 2019-02-13 ENCOUNTER — Telehealth: Payer: Self-pay | Admitting: Cardiology

## 2019-02-13 NOTE — Telephone Encounter (Signed)

## 2019-02-14 ENCOUNTER — Ambulatory Visit (INDEPENDENT_AMBULATORY_CARE_PROVIDER_SITE_OTHER): Payer: Medicare Other

## 2019-02-14 ENCOUNTER — Telehealth: Payer: Self-pay | Admitting: *Deleted

## 2019-02-14 ENCOUNTER — Other Ambulatory Visit: Payer: Self-pay

## 2019-02-14 DIAGNOSIS — I739 Peripheral vascular disease, unspecified: Secondary | ICD-10-CM | POA: Diagnosis not present

## 2019-02-14 NOTE — Telephone Encounter (Signed)
-----   Message from Satira Sark, MD sent at 02/14/2019  3:46 PM EDT ----- Results reviewed.  Lower extremity ABIs are normal bilaterally.  Would continue with medical therapy and follow-up plan.

## 2019-02-14 NOTE — Telephone Encounter (Signed)
Patient informed. Copy sent to PCP °

## 2019-02-22 ENCOUNTER — Institutional Professional Consult (permissible substitution): Payer: TRICARE For Life (TFL) | Admitting: Neurology

## 2019-03-30 ENCOUNTER — Other Ambulatory Visit: Payer: Self-pay | Admitting: Cardiovascular Disease

## 2019-04-02 ENCOUNTER — Ambulatory Visit (INDEPENDENT_AMBULATORY_CARE_PROVIDER_SITE_OTHER): Payer: Medicare Other | Admitting: Neurology

## 2019-04-02 ENCOUNTER — Encounter: Payer: Self-pay | Admitting: Neurology

## 2019-04-02 ENCOUNTER — Other Ambulatory Visit: Payer: Self-pay

## 2019-04-02 VITALS — BP 126/70 | HR 52 | Temp 98.4°F | Ht 71.0 in | Wt 219.2 lb

## 2019-04-02 DIAGNOSIS — E538 Deficiency of other specified B group vitamins: Secondary | ICD-10-CM | POA: Diagnosis not present

## 2019-04-02 DIAGNOSIS — G609 Hereditary and idiopathic neuropathy, unspecified: Secondary | ICD-10-CM

## 2019-04-02 DIAGNOSIS — M21371 Foot drop, right foot: Secondary | ICD-10-CM

## 2019-04-02 DIAGNOSIS — R269 Unspecified abnormalities of gait and mobility: Secondary | ICD-10-CM

## 2019-04-02 DIAGNOSIS — M21372 Foot drop, left foot: Secondary | ICD-10-CM

## 2019-04-02 HISTORY — DX: Foot drop, right foot: M21.372

## 2019-04-02 HISTORY — DX: Foot drop, right foot: M21.371

## 2019-04-02 NOTE — Progress Notes (Signed)
Reason for visit: Peripheral neuropathy  Referring physician: Dr. Mosie Lukes. is a 84 y.o. male  History of present illness:  David Pugh is an 84 year old right-handed white male with a history of a peripheral neuropathy, he was seen to this office initially approximately 10 years ago.  The patient was last seen in April 2015.  At that time, he was having difficulty with gait instability, he was using a cane for ambulation.  Over time, the peripheral neuropathy has gradually worsened, he stumbles frequently, but he has not had any falls.  He has no pain associated with neuropathy in his hands or in his feet fortunately.  He feels numb in the distal third of the legs bilaterally.  He does drop things from his hands on occasion.  He is still able to operate a motor vehicle, he uses both feet to operate the pedals.  He has to be careful when going up and down stairs, he will hold onto the railing.  He sleeps well at night, he denies any pain issues in this regard.  He does have some low back pain, he denies neck pain.  He reports that he does have hammertoes particularly on the right foot.  He was sent to this office for an evaluation.  Past Medical History:  Diagnosis Date  . Abnormality of gait 12/18/2013  . Arthritis   . Biliary dyskinesia   . CAD (coronary artery disease) 2012   a. DES x2 in ramus/OM and mid LAD in 2012  b. balloon angioplaty of OM 2015  . CLL (chronic lymphocytic leukemia) ([place])   . Colon cancer [facility]) 2013   Right hemicolectomy, did not tolerate chemotherapy  . Colon polyps    tubular adenomas  . Diverticula of colon 2010  . Essential hypertension   . Exposure to [employer]   . Folic acid deficiency   . Gastroparesis 2014  . GERD (gastroesophageal reflux disease)   . Glaucoma   . Heart palpitations 2014  . Hiatal hernia 2010  . History of anemia as a child   . History of kidney stones   . Iron deficiency 2013  . Myocardial infarction  ([place]) 2012  . Peripheral neuropathy   . Prostate cancer [facility]) 2010   Radiation, surgery  . PTSD (post-traumatic stress disorder)   . Radiation proctitis   . Rosacea     Past Surgical History:  Procedure Laterality Date  . CARDIAC CATHETERIZATION  02/13/2011   [facility], [facility] cardiology  . CATARACT EXTRACTION     2011  . CATARACT EXTRACTION W/PHACO Right 08/06/2013   Procedure: CATARACT EXTRACTION PHACO AND INTRAOCULAR LENS PLACEMENT (IOC);  Surgeon: Tonny Branch, MD;  Location: [facility] ORS;  Service: Ophthalmology;  Laterality: Right;  CDE:20.96  . CHOLECYSTECTOMY    . COLONOSCOPY  02/12/2004   Dr. Gala Romney- L side diverticula, inflammatory  colon polyps  . COLONOSCOPY  01/19/2012   RMR: Prominent changes involving the rectal mucosa consistent with radiation-induced proctitis. Multiple colonic  polyps removed as described above. Sigmoid Diverticulosis/ 1.5 x 2 cm relatively flat ulcerated lesion in cecum/path showed adenocarcinoma of the colon. descending colon polyp with tubular adenoma and one fragment of focal high grade dysplasia.   . COLONOSCOPY  08/28/2012   SEL:TRVUYEBXI proctitis. Colonic diverticulosis/Ulcerations at the surgical anastomosis  path: ulcerated colonic mucosa with prolapse changes, tubular adenoma.   . COLONOSCOPY N/A 08/02/2013   RMR: Colonic polyps -removed as described above. Colonic diverticulosis. Status post reight hemicolectomy.  Radiation proctitis- asymptomatic.   Marland Kitchen COLONOSCOPY N/A 05/09/2015   Procedure: COLONOSCOPY;  Surgeon: Daneil Dolin, MD;  Location: [facility];  Service: Endoscopy;  Laterality: N/A;  1200-moved to [address]5 [address] to notify pt  . COLONOSCOPY WITH PROPOFOL N/A 11/10/2017   Procedure: COLONOSCOPY WITH PROPOFOL;  Surgeon: Daneil Dolin, MD;  Location: [facility];  Service: Endoscopy;  Laterality: N/A;  . CORONARY ANGIOPLASTY  06/17/14   OM1 POBA  . CORONARY STENT PLACEMENT  01/2011   2.5x42mm Xience drug-eluting stent in ramus intermedius/OMI  vessel, 2.5x16mm Promus Element stent in mid LAD artery  . ESOPHAGOGASTRODUODENOSCOPY  10/10/2008   Dr. Tilda Burrow hiatal hernia, normal esophagus, normal stomach  . ESOPHAGOGASTRODUODENOSCOPY  02/12/2004   Dr. Dudley Major- normal   . ESOPHAGOGASTRODUODENOSCOPY N/A 05/09/2015   Procedure: ESOPHAGOGASTRODUODENOSCOPY (EGD);  Surgeon: Daneil Dolin, MD;  [facility];  Service: Endoscopy;  Laterality: N/A;  . ESOPHAGOGASTRODUODENOSCOPY (EGD) WITH PROPOFOL N/A 11/10/2017   Procedure: ESOPHAGOGASTRODUODENOSCOPY (EGD) WITH PROPOFOL;  Surgeon: Daneil Dolin, MD;  [facility];  Service: Endoscopy;  Laterality: N/A;  9:45am  . GIVENS CAPSULE STUDY N/A 06/03/2015   Procedure: GIVENS CAPSULE STUDY;  Surgeon: Daneil Dolin, MD;  [facility]: [facility];  Service: Endoscopy;  Laterality: N/A;  0700  . HOT HEMOSTASIS  11/10/2017   Procedure: HOT HEMOSTASIS (ARGON PLASMA COAGULATION/BICAP);  Surgeon: Daneil Dolin, MD;  Location[facility];  Service: Endoscopy;;  rectum  . KNEE SURGERY     left knee   . LEFT HEART CATHETERIZATION WITH CORONARY ANGIOGRAM N/A 06/17/2014   Procedure: LEFT HEART CATHETERIZATION WITH CORONARY ANGIOGRAM;  Surgeon: Burnell Blanks, MD;  Location[facility] CATH LAB;  Service: Cardiovascular;  Laterality: N/A;  . PARTIAL COLECTOMY  02/29/2012   Procedure: PARTIAL COLECTOMY;  Surgeon: Adin Hector, MD;  Location: [facility];  Service: General;  Laterality: Right;  . POLYPECTOMY  11/10/2017   Procedure: POLYPECTOMY;  Surgeon: Daneil Dolin, MD;  Location[facility];  Service: Endoscopy;;  gastric colon  . PROSTATECTOMY    . TONSILLECTOMY    . TRANSURETHRAL RESECTION OF PROSTATE    . VASECTOMY      Family History  Problem Relation Age of Onset  . Throat cancer Father        dx in his 22s and again in his 54s; pipe and cigar smoker  . Neuropathy Brother   . Prostate cancer Brother 87  . Melanoma Brother        dx in his 21s  . Cancer Paternal Uncle         smoking related cancer  . Heart attack Paternal Grandfather   . Stomach cancer Cousin 63  . Colon cancer Neg Hx     Social history:  reports that he has never smoked. He has never used smokeless tobacco. He reports current alcohol use. He reports that he does not use drugs.  Medications:  Prior to Admission medications   Medication Sig Start Date End Date Taking? Authorizing Provider  ALPRAZolam Duanne Moron) 0.5 MG tablet Take 0.5 mg by mouth daily as needed for anxiety.   Yes [provider]  aspirin EC 81 MG tablet Take 81 mg by mouth daily.   Yes [provider]  B Complex Vitamins (VITAMIN B COMPLEX PO) Take 1 tablet by mouth at bedtime.    Yes [provider]  cetirizine (ZYRTEC) 10 MG tablet Take 10 mg by mouth daily.   Yes  [provider]  cholecalciferol (VITAMIN D) 400 units TABS tablet Take 400 Units by mouth daily.   Yes [provider]  citalopram (CELEXA) 20 MG tablet Take 20 mg by mouth daily.   Yes [provider]  clopidogrel (PLAVIX) 75 MG tablet TAKE (1) TABLET BY MOUTH ONCE DAILY. 07/03/18  Yes Croitoru, Mihai, MD  folic acid (FOLVITE) 1 MG tablet TAKE (1) TABLET BY MOUTH ONCE DAILY . Patient taking differently: Take 1 mg by mouth once a week.  07/13/17  Yes Holley Bouche, NP  hydrocortisone (ANUSOL-HC) 2.5 % rectal cream Place 1 application rectally 3 (three) times daily. Patient taking differently: Place 1 application rectally as needed.  11/25/17  Yes Rourk, Cristopher Estimable, MD  ibuprofen (ADVIL,MOTRIN) 200 MG tablet Take 400 mg by mouth daily as needed for headache or moderate pain.   Yes [provider]  latanoprost (XALATAN) 0.005 % ophthalmic solution Place 1 drop into both eyes at bedtime.    Yes [provider]  loperamide (IMODIUM) 2 MG capsule Take 2 mg by mouth as needed for diarrhea or loose stools.   Yes [provider]  losartan (COZAAR) 50 MG tablet Take 25 mg by mouth 2 (two) times  daily.    Yes [provider]  meloxicam (MOBIC) 15 MG tablet Take 15 mg by mouth daily.   Yes [provider]  metoprolol (LOPRESSOR) 50 MG tablet Take 50 mg by mouth 2 (two) times daily.    Yes [provider]  montelukast (SINGULAIR) 10 MG tablet Take 10 mg by mouth at bedtime.   Yes [provider]  Multiple Vitamin (MULTIVITAMIN WITH MINERALS) TABS tablet Take 1 tablet by mouth daily.   Yes [provider]  nitroGLYCERIN (NITROSTAT) 0.4 MG SL tablet PLACE ONE (1) TABLET UNDER TONGUE EVERY 5 MINUTES UP TO (3) DOSES AS NEEDED FOR CHEST PAIN. 11/15/18  Yes Croitoru, Mihai, MD  ofloxacin (OCUFLOX) 0.3 % ophthalmic solution Place 1 drop into both eyes as needed.    Yes [provider]  pantoprazole (PROTONIX) 40 MG tablet Take 40 mg by mouth daily.   Yes [provider]  pravastatin (PRAVACHOL) 40 MG tablet TAKE 1 TABLET BY MOUTH AT BEDTIME. 04/02/19  Yes Satira Sark, MD  saccharomyces boulardii (FLORASTOR) 250 MG capsule Take 250 mg by mouth daily.    Yes [provider]  vitamin B-12 (CYANOCOBALAMIN) 1000 MCG tablet Take by mouth.   Yes [provider]  Vitamins/Minerals TABS Take 1 tablet by mouth daily.    Yes [provider]  Wheat Dextrin (BENEFIBER) POWD Take by mouth as needed. Daily in the AM    Yes [provider]      Allergies  Allergen Reactions  . Shellfish Allergy Anaphylaxis  . Sulfonamide Derivatives Diarrhea and Nausea Only    ROS:  Out of a complete 14 system review of symptoms, the patient complains only of the following symptoms, and all other reviewed systems are negative.  Walking difficulty, stumbles Numbness Low back pain  Blood pressure 126/70, pulse (!) 52, temperature 98.4 F (36.9 C), temperature source Temporal, height 5\' 11"  (1.803 m), weight 219 lb 4 oz (99.5 kg).  Physical Exam  General: The patient is alert and cooperative at the time of the  examination.  Eyes: Pupils are equal, round, and reactive to light. Discs are flat bilaterally.  Neck: The neck is supple, no carotid bruits are noted.  Respiratory: The respiratory examination is clear.  Cardiovascular: The cardiovascular examination reveals a regular rate and rhythm, no obvious murmurs or rubs are noted.  Skin: Extremities are without significant edema.  Neurologic Exam  Mental status: The patient is alert and oriented x 3 at the time of the examination. The patient has apparent normal recent and remote memory, with an apparently normal attention span and concentration ability.  Cranial nerves: Facial symmetry is present. There is good sensation of the face to pinprick and soft touch bilaterally. The strength of the facial muscles and the muscles to head turning and shoulder shrug are normal bilaterally. Speech is well enunciated, no aphasia or dysarthria is noted. Extraocular movements are full. Visual fields are full. The tongue is midline, and the patient has symmetric elevation of the soft palate. No obvious hearing deficits are noted.  Motor: The motor testing reveals 5 over 5 strength of all 4 extremities, with exception of prominent bilateral foot drop. Good symmetric motor tone is noted throughout.  Sensory: Sensory testing is intact to pinprick, soft touch, vibration sensation, and position sense on the upper extremities.  With the lower extremities, there is a stocking pattern pinprick sensory deficit across the ankle bilaterally, significant impairment of vibration and position sense is seen in both feet.  No evidence of extinction is noted.  Coordination: Cerebellar testing reveals good finger-nose-finger and heel-to-shin bilaterally.  Gait and station: Gait is wide-based, unsteady.  The patient normally uses a cane for ambulation.  Tandem gait was not attempted.  Romberg is unsteady but he does not fall.  Reflexes: Deep tendon reflexes are symmetric, but are  depressed bilaterally. Toes are downgoing bilaterally.   Assessment/Plan:  1.  Peripheral neuropathy  2.  Bilateral foot drops  3.  Gait disturbance  The patient has a significant peripheral neuropathy associated with significant numbness and motor weakness that is his likely primary cause of his gait disorder.  The patient could benefit from bilateral AFO braces, a prescription was given.  He will be set up for physical therapy.  Further blood work will be done.  We will follow-up in 6 months.  David Alexanders MD 04/02/2019 11:40 AM  [facility] [address] [place], [place] [address]  Phone [phone] Fax [phone]

## 2019-04-04 ENCOUNTER — Encounter: Payer: Self-pay | Admitting: Internal Medicine

## 2019-04-04 LAB — MULTIPLE MYELOMA PANEL, SERUM
Albumin SerPl Elph-Mcnc: 3.9 g/dL (ref 2.9–4.4)
Albumin/Glob SerPl: 1.6 (ref 0.7–1.7)
Alpha 1: 0.2 g/dL (ref 0.0–0.4)
Alpha2 Glob SerPl Elph-Mcnc: 0.8 g/dL (ref 0.4–1.0)
B-Globulin SerPl Elph-Mcnc: 0.9 g/dL (ref 0.7–1.3)
Gamma Glob SerPl Elph-Mcnc: 0.7 g/dL (ref 0.4–1.8)
Globulin, Total: 2.5 g/dL (ref 2.2–3.9)
IgA/Immunoglobulin A, Serum: 145 mg/dL (ref 61–437)
IgG (Immunoglobin G), Serum: 848 mg/dL (ref 603–1613)
IgM (Immunoglobulin M), Srm: 43 mg/dL (ref 15–143)
Total Protein: 6.4 g/dL (ref 6.0–8.5)

## 2019-04-04 LAB — VITAMIN B12: Vitamin B-12: 643 pg/mL (ref 232–1245)

## 2019-04-04 LAB — B. BURGDORFI ANTIBODIES: Lyme IgG/IgM Ab: <0.91 {ISR} (ref 0.00–0.90)

## 2019-04-04 LAB — ANA W/REFLEX: Anti Nuclear Antibody (ANA): NEGATIVE

## 2019-04-06 ENCOUNTER — Encounter (HOSPITAL_COMMUNITY): Payer: Self-pay

## 2019-04-06 ENCOUNTER — Other Ambulatory Visit: Payer: Self-pay

## 2019-04-06 ENCOUNTER — Ambulatory Visit (HOSPITAL_COMMUNITY): Payer: Medicare Other | Attending: Neurology

## 2019-04-06 DIAGNOSIS — R2689 Other abnormalities of gait and mobility: Secondary | ICD-10-CM | POA: Insufficient documentation

## 2019-04-06 DIAGNOSIS — M6281 Muscle weakness (generalized): Secondary | ICD-10-CM | POA: Diagnosis present

## 2019-04-06 DIAGNOSIS — R2681 Unsteadiness on feet: Secondary | ICD-10-CM | POA: Diagnosis present

## 2019-04-06 NOTE — Therapy (Signed)
[facility] [address] [place], [place], [address] Phone: [phone]   Fax:  [phone]  Physical Therapy Evaluation  Patient Details  Name: David Pugh. MRN: [id number] Date of Birth: 1934/12/26 Referring Provider (PT): Kathrynn Ducking, MD   Encounter Date: 04/06/2019  PT End of Session - 04/06/19 1230    Visit Number  1    Number of Visits  8    Date for PT Re-Evaluation  05/04/19    Authorization Type  [employer]   Secondary: [employer]   Authorization Time Period  04/06/19 to 05/04/19    Authorization - Visit Number  1    Authorization - Number of Visits  10    PT Start Time  1115    PT Stop Time  1200    PT Time Calculation (min)  45 min    Equipment Utilized During Treatment  Gait belt    Activity Tolerance  Patient tolerated treatment well    Behavior During Therapy  WFL for tasks assessed/performed       Past Medical History:  Diagnosis Date  . Abnormality of gait 12/18/2013  . Arthritis   . Bilateral foot-drop 04/02/2019  . Biliary dyskinesia   . CAD (coronary artery disease) 2012   a. DES x2 in ramus/OM and mid LAD in 2012  b. balloon angioplaty of OM 2015  . CLL (chronic lymphocytic leukemia) ([place])   . Colon cancer [facility]) 2013   Right hemicolectomy, did not tolerate chemotherapy  . Colon polyps    tubular adenomas  . Diverticula of colon 2010  . Essential hypertension   . Exposure to [employer]   . Folic acid deficiency   . Gastroparesis 2014  . GERD (gastroesophageal reflux disease)   . Glaucoma   . Heart palpitations 2014  . Hiatal hernia 2010  . History of anemia as a child   . History of kidney stones   . Iron deficiency 2013  . Myocardial infarction ([place]) 2012  . Peripheral neuropathy   . Prostate cancer [facility]) 2010   Radiation, surgery  . PTSD (post-traumatic stress disorder)   . Radiation proctitis   . Rosacea     Past Surgical History:  Procedure Laterality Date  . CARDIAC  CATHETERIZATION  02/13/2011   [facility], [facility] cardiology  . CATARACT EXTRACTION     2011  . CATARACT EXTRACTION W/PHACO Right 08/06/2013   Procedure: CATARACT EXTRACTION PHACO AND INTRAOCULAR LENS PLACEMENT (IOC);  Surgeon: Tonny Branch, MD;  Location: [facility] ORS;  Service: Ophthalmology;  Laterality: Right;  CDE:20.96  . CHOLECYSTECTOMY    . COLONOSCOPY  02/12/2004   Dr. Gala Romney- L side diverticula, inflammatory  colon polyps  . COLONOSCOPY  01/19/2012   RMR: Prominent changes involving the rectal mucosa consistent with radiation-induced proctitis. Multiple colonic  polyps removed as described above. Sigmoid Diverticulosis/ 1.5 x 2 cm relatively flat ulcerated lesion in cecum/path showed adenocarcinoma of the colon. descending colon polyp with tubular adenoma and one fragment of focal high grade dysplasia.   . COLONOSCOPY  08/28/2012   GYI:RSWNIOEVO proctitis. Colonic diverticulosis/Ulcerations at the surgical anastomosis  path: ulcerated colonic mucosa with prolapse changes, tubular adenoma.   . COLONOSCOPY N/A 08/02/2013   RMR: Colonic polyps -removed as described above. Colonic diverticulosis. Status post reight hemicolectomy. Radiation proctitis- asymptomatic.   Marland Kitchen COLONOSCOPY N/A 05/09/2015   Procedure: COLONOSCOPY;  Surgeon: Daneil Dolin, MD;  Location: [facility];  Service: Endoscopy;  Laterality: N/A;  1200-moved to [address] to notify pt  . COLONOSCOPY WITH PROPOFOL N/A 11/10/2017   Procedure: COLONOSCOPY WITH PROPOFOL;  Surgeon: Daneil Dolin, MD;  Location: [facility];  Service: Endoscopy;  Laterality: N/A;  . CORONARY ANGIOPLASTY  06/17/14   OM1 POBA  . CORONARY STENT PLACEMENT  01/2011   2.5x79mm Xience drug-eluting stent in ramus intermedius/OMI vessel, 2.5x109mm Promus Element stent in mid LAD artery  . ESOPHAGOGASTRODUODENOSCOPY  10/10/2008   Dr. Tilda Burrow hiatal hernia, normal esophagus, normal stomach  . ESOPHAGOGASTRODUODENOSCOPY  02/12/2004   Dr. Dudley Major- normal   .  ESOPHAGOGASTRODUODENOSCOPY N/A 05/09/2015   Procedure: ESOPHAGOGASTRODUODENOSCOPY (EGD);  Surgeon: Daneil Dolin, MD;  [facility];  Service: Endoscopy;  Laterality: N/A;  . ESOPHAGOGASTRODUODENOSCOPY (EGD) WITH PROPOFOL N/A 11/10/2017   Procedure: ESOPHAGOGASTRODUODENOSCOPY (EGD) WITH PROPOFOL;  Surgeon: Daneil Dolin, MD;  Location: [facility];  Service: Endoscopy;  Laterality: N/A;  9:45am  . GIVENS CAPSULE STUDY N/A 06/03/2015   Procedure: GIVENS CAPSULE STUDY;  Surgeon: Daneil Dolin, MD;  Location: [facility];  Service: Endoscopy;  Laterality: N/A;  0700  . HOT HEMOSTASIS  11/10/2017   Procedure: HOT HEMOSTASIS (ARGON PLASMA COAGULATION/BICAP);  Surgeon: Daneil Dolin, MD;  Location: [facility];  Service: Endoscopy;;  rectum  . KNEE SURGERY     left knee   . LEFT HEART CATHETERIZATION WITH CORONARY ANGIOGRAM N/A 06/17/2014   Procedure: LEFT HEART CATHETERIZATION WITH CORONARY ANGIOGRAM;  Surgeon: Burnell Blanks, MD;  Location: [facility] CATH LAB;  Service: Cardiovascular;  Laterality: N/A;  . PARTIAL COLECTOMY  02/29/2012   Procedure: PARTIAL COLECTOMY;  Surgeon: Adin Hector, MD;  Location: [facility];  Service: General;  Laterality: Right;  . POLYPECTOMY  11/10/2017   Procedure: POLYPECTOMY;  Surgeon: Daneil Dolin, MD;  Location: [facility];  Service: Endoscopy;;  gastric colon  . PROSTATECTOMY    . TONSILLECTOMY    . TRANSURETHRAL RESECTION OF PROSTATE    . VASECTOMY      There were no vitals filed for this visit.   Subjective Assessment - 04/06/19 1119    Subjective  Pt reports about 12 years ago he began losing sensation in bil feet and has continued to progress. Pt reports previously doing PT for same issues 5 years ago with benefits, but recently was diagnosed with bil foot drop. Pt reports 1 fall this year when walking aorund house and tripped on rug, 15 near falls approximately due to tripping over objects. Pt reports difficulty with balance when  performing funcitonal activities due to R foot drop more than L. Pt denies pain in bil feet. Pt reports basement (11 steps) with handrail that he performs with HR and occasional tripping. Pt reports exercising 5x/week on elliptical and walking ~1 mile/day with walking stick. Ptdenies tingling or pain in bil feet, only numbness that stops at top of socks above ankles. Pt reports using SPC in community, but nothing inside the home.    Limitations  Walking;House hold activities    How long can you sit comfortably?  no issues    How long can you stand comfortably?  no issues; back limits    How long can you walk comfortably?  no issues when uses walking stick/SPC in community/outside    Diagnostic tests  none    Patient Stated Goals  regain balance    Currently in Pain?  No/denies         [facility] PT Assessment - 04/06/19 0001  Assessment   Medical Diagnosis  idiopathic PN, gait abnomality, bil foot drop    Referring Provider (PT)  Kathrynn Ducking, MD    Onset Date/Surgical Date  --   declining over ~12 years   Next MD Visit  f/u Feb 2021    Prior Therapy  Yes, for balance but without dropfoot diagnosis      Precautions   Precautions  Fall      Restrictions   Weight Bearing Restrictions  No      Balance Screen   Has the patient fallen in the past 6 months  Yes    How many times?  1    Has the patient had a decrease in activity level because of a fear of falling?   No    Is the patient reluctant to leave their home because of a fear of falling?   No      Prior Function   Level of Independence  Independent    Vocation  Retired    Leisure  exercise (eliptical), walking, target shoot      Observation/Other Assessments   Observations  --    Skin Integrity  Bil foot inspection without any wounds noted to dorsal or plantar surfaces    Focus on Therapeutic Outcomes (FOTO)   n/a      Sensation   Light Touch  Impaired Detail    Light Touch Impaired Details  Impaired RLE;Impaired LLE     Additional Comments  Lack of sensation (light touch and deep ressure) to bil feet; sensation begins 4.5" superior to L lateral mal and 6.5" superior to R lateral mal      Functional Tests   Functional tests  --      Sit to Stand   Comments  --      ROM / Strength   AROM / PROM / Strength  AROM;Strength      AROM   AROM Assessment Site  Ankle    Right/Left Ankle  Right;Left    Right Ankle Dorsiflexion  3    Right Ankle Plantar Flexion  50    Left Ankle Dorsiflexion  2    Left Ankle Plantar Flexion  55      Strength   Strength Assessment Site  Hip;Knee;Ankle    Right Hip Flexion  4/5    Right Hip Extension  4-/5    Right Hip ABduction  4/5    Left Hip Flexion  5/5    Left Hip Extension  4-/5    Left Hip ABduction  4/5    Right Knee Flexion  4/5    Right Knee Extension  5/5    Left Knee Flexion  4/5    Left Knee Extension  5/5    Right Ankle Dorsiflexion  3/5    Right Ankle Plantar Flexion  2+/5   single leg, <1 full rep   Right Ankle Inversion  4/5    Right Ankle Eversion  4/5    Left Ankle Dorsiflexion  3/5    Left Ankle Plantar Flexion  2+/5   single leg, <1 full rep   Left Ankle Inversion  4/5    Left Ankle Eversion  4/5      Special Tests    Special Tests  --    Lumbar Tests  --      Ambulation/Gait   Ambulation/Gait  Yes    Ambulation/Gait Assistance  4: Min guard;5: Supervision    Assistive device  None  Ambulation Surface  Level;Indoor    Gait Comments  general unsteadiness, veering R and L, lateral protective stepping, decreased heel-toe pattern, no foot drop noted throughout gait cycle, no near falls noted      Balance   Balance Assessed  Yes      Static Standing Balance   Static Standing - Balance Support  No upper extremity supported    Static Standing Balance -  Activities   Single Leg Stance - Right Leg;Single Leg Stance - Left Leg    Static Standing - Comment/# of Minutes  R: <2 sec L: <2 sec   very unsteady     Functional Gait   Assessment   Gait assessed   Yes    Gait Level Surface  Walks 20 ft, slow speed, abnormal gait pattern, evidence for imbalance or deviates 10-15 in outside of the 12 in walkway width. Requires more than 7 sec to ambulate 20 ft.    Change in Gait Speed  Makes only minor adjustments to walking speed, or accomplishes a change in speed with significant gait deviations, deviates 10-15 in outside the 12 in walkway width, or changes speed but loses balance but is able to recover and continue walking.    Gait with Horizontal Head Turns  Performs head turns smoothly with slight change in gait velocity (eg, minor disruption to smooth gait path), deviates 6-10 in outside 12 in walkway width, or uses an assistive device.    Gait with Vertical Head Turns  Performs task with moderate change in gait velocity, slows down, deviates 10-15 in outside 12 in walkway width but recovers, can continue to walk.    Gait and Pivot Turn  Turns slowly, requires verbal cueing, or requires several small steps to catch balance following turn and stop    Step Over Obstacle  Is able to step over one shoe box (4.5 in total height) but must slow down and adjust steps to clear box safely. May require verbal cueing.    Gait with Narrow Base of Support  Ambulates less than 4 steps heel to toe or cannot perform without assistance.   2 steps   Gait with Eyes Closed  Walks 20 ft, slow speed, abnormal gait pattern, evidence for imbalance, deviates 10-15 in outside 12 in walkway width. Requires more than 9 sec to ambulate 20 ft.    Ambulating Backwards  Walks 20 ft, slow speed, abnormal gait pattern, evidence for imbalance, deviates 10-15 in outside 12 in walkway width.    Steps  Two feet to a stair, must use rail.    Total Score  10        Objective measurements completed on examination: See above findings.         PT Education - 04/06/19 1126    Education Details  Assessment findings, POC, intiated HEP    Person(s) Educated   Patient    Methods  Explanation;Handout    Comprehension  Verbalized understanding       PT Short Term Goals - 04/06/19 1234      PT SHORT TERM GOAL #1   Title  Pt will be independent with HEP, update as needed and perform consistently.    Time  2    Period  Weeks    Status  New    Target Date  04/20/19      PT SHORT TERM GOAL #2   Title  Pt will improve ankle muscle strength by 1/2 grade to improve ability to perform funcitonal  activities and reduce risk for falls.    Time  2    Period  Weeks    Status  New      PT SHORT TERM GOAL #3   Title  Pt will maintain SLS for 15 sec to demo improved functional strength and reduce risk for falls.    Time  2    Period  Weeks    Status  New        PT Long Term Goals - 04/06/19 1236      PT LONG TERM GOAL #1   Title  Pt will improve muscle strength by 1 grade to improve ability to perform funcitonal activities and reduce risk for falls.    Time  4    Period  Weeks    Status  New    Target Date  05/04/19      PT LONG TERM GOAL #2   Title  Pt will improve FGA score to >19 to reduce risk for falls and improve safety with ambulation around the community.    Time  4    Period  Weeks    Status  New      PT LONG TERM GOAL #3   Title  Pt will maintain SLS for 30 sec to demo improved functional strength and reduce risk for falls.    Time  4    Period  Weeks    Status  New      PT LONG TERM GOAL #4   Title  Pt will perform 5x STS in 14.8 sec or < to indicate improve funcitonal strength and ability to move around home safely.    Time  4    Period  Weeks    Status  New             Plan - 04/06/19 1225    Clinical Impression Statement  Pt is a pleasant 84 YO male who presents to therapy with increasing bil foot polyneuropathy impairing balance with gait. Subjectively, pt is active and not limited by pain with previous positive results to PT. Upon evaluation, pt has glove and stocking syndrome numbness throughout bil feet and  is unable to feel light touch or deep pressure until 4.5" superior to L lat mal and 6.5" superior to R lat mal. Pt presents with deficits in balance AEB <2 sec in SLS without UE support and scoring 10 on FGA (<19 indicates high risk for falls). Pt with minimal deficits in bil hip and knee strength, but significant deficits in plantar and dorsiflexion bilaterally. Pt's ankle AROM WNL bil, except lacking some DF, and able to perform toe raises in seated position in decreased range. Pt would benefit from skilled PT interventions to improve strength, AROM, balance, gait mechanics, and overall ability to perform functional activities with reduced risk for falls.    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  Evolving/Moderate complexity    Clinical Decision Making  Moderate    Rehab Potential  Good    PT Frequency  2x / week    PT Duration  4 weeks    PT Treatment/Interventions  ADLs/Self Care Home Management;Aquatic Therapy;Biofeedback;Cryotherapy;Electrical Stimulation;Iontophoresis 4mg /ml Dexamethasone;Moist Heat;Ultrasound;DME Instruction;Gait training;Stair training;Functional mobility training;Therapeutic activities;Therapeutic exercise;Balance training;Neuromuscular re-education;Patient/family education;Orthotic Fit/Training;Manual techniques;Passive range of motion;Dry needling;Taping;Joint Manipulations  PT Next Visit Plan  Review goals, 5x STS. Begin hip and calf strengthening. Challenge balance. Gait training with and without SPC. Update HEP weekly.    PT Home Exercise Plan  Eval: seated heel and toe raises    Consulted and Agree with Plan of Care  Patient       Patient will benefit from skilled  therapeutic intervention in order to improve the following deficits and impairments:  Abnormal gait, Decreased balance, Decreased coordination, Decreased range of motion, Decreased strength, Difficulty walking, Impaired sensation  Visit Diagnosis: 1. Unsteadiness on feet   2. Other abnormalities of gait and mobility   3. Muscle weakness (generalized)        Problem List Patient Active Problem List   Diagnosis Date Noted  . Bilateral foot-drop 04/02/2019  . Decreased appetite 08/18/2017  . Loss of weight 08/18/2017  . Dark stools 08/18/2017  . Morbid obesity ([place]) 05/02/2017  . Vitamin D deficiency 11/26/2015  . CLL (chronic lymphocytic leukemia) ([place]) 07/21/2015  . Mucosal abnormality of esophagus   . Mucosal abnormality of stomach   . Multiple gastric polyps   . History of colon cancer   . Diverticulosis of colon without hemorrhage   . Family history of prostate cancer   . Family history of colon cancer   . Family history of stomach cancer   . Benign prostatic hyperplasia with urinary obstruction 09/12/2014  . H/O prostate cancer 08/20/2014  . Radiation proctitis 08/19/2014  . H/O agent Orange exposure 07/01/2014  . Unstable angina ([place]) 06/16/2014  . Shingles outbreak, right anterior thoracic 01/28/2014  . Gait abnormality 12/18/2013  . Viral gastroenteritis 11/26/2013  . Dyslipidemia 02/12/2013  . Peripheral neuropathy 10/14/2012  . CAD (coronary artery disease) 10/14/2012  . HTN (hypertension) 10/14/2012  . Hx of adenomatous colonic polyps 07/26/2012  . Folic acid deficiency 51/70/0174  . Iron deficiency 05/02/2012  . Cancer of ascending colon ([place]) 03/29/2012  . GERD 03/09/2010  . PREMATURE VENTRICULAR CONTRACTIONS 04/30/2009      Talbot Grumbling PT, DPT 04/06/19, 12:42 PM [facility] [address] [place], [place], [address] Phone: [phone]   Fax:  [phone]  Name: Thijs Brunton. MRN: [id number] Date of Birth: Mar 12, 1935

## 2019-04-10 ENCOUNTER — Other Ambulatory Visit: Payer: Self-pay

## 2019-04-10 ENCOUNTER — Ambulatory Visit (HOSPITAL_COMMUNITY): Payer: Medicare Other

## 2019-04-10 ENCOUNTER — Encounter (HOSPITAL_COMMUNITY): Payer: Self-pay

## 2019-04-10 DIAGNOSIS — M6281 Muscle weakness (generalized): Secondary | ICD-10-CM

## 2019-04-10 DIAGNOSIS — R2681 Unsteadiness on feet: Secondary | ICD-10-CM

## 2019-04-10 DIAGNOSIS — R2689 Other abnormalities of gait and mobility: Secondary | ICD-10-CM

## 2019-04-10 NOTE — Therapy (Signed)
[place], [place], [address] Phone: [phone]   Fax:  [phone]  Physical Therapy Treatment  Patient Details  Name: David Pugh. MRN: [id number] Date of Birth: [birth year] Referring Provider (PT): Kathrynn Ducking, MD   Encounter Date: 04/10/2019  PT End of Session - 04/10/19 1456    Visit Number  2    Number of Visits  8    Date for PT Re-Evaluation  05/04/19    Authorization Type  [employer]; Secondary: [employer] for life    Authorization Time Period  04/06/19 to 05/04/19    Authorization - Visit Number  2    Authorization - Number of Visits  10    PT Start Time  1740    PT Stop Time  1352    PT Time Calculation (min)  40 min    Equipment Utilized During Treatment  Gait belt    Activity Tolerance  Patient tolerated treatment well    Behavior During Therapy  WFL for tasks assessed/performed       Past Medical History:  Diagnosis Date  . Abnormality of gait 12/18/2013  . Arthritis   . Bilateral foot-drop 04/02/2019  . Biliary dyskinesia   . CAD (coronary artery disease) 2012   a. DES x2 in ramus/OM and mid LAD in 2012  b. balloon angioplaty of OM 2015  . CLL (chronic lymphocytic leukemia) ([place])   . Colon cancer [facility]) 2013   Right hemicolectomy, did not tolerate chemotherapy  . Colon polyps    tubular adenomas  . Diverticula of colon 2010  . Essential hypertension   . Exposure to [employer]   . Folic acid deficiency   . Gastroparesis 2014  . GERD (gastroesophageal reflux disease)   . Glaucoma   . Heart palpitations 2014  . Hiatal hernia 2010  . History of anemia as a child   . History of kidney stones   . Iron deficiency 2013  . Myocardial infarction ([place]) 2012  . Peripheral neuropathy   . Prostate cancer [facility]) 2010   Radiation, surgery  . PTSD (post-traumatic stress disorder)   . Radiation proctitis   . Rosacea     Past Surgical History:  Procedure Laterality Date  . CARDIAC  CATHETERIZATION  02/13/2011   [facility], [facility] cardiology  . CATARACT EXTRACTION     2011  . CATARACT EXTRACTION W/PHACO Right 08/06/2013   Procedure: CATARACT EXTRACTION PHACO AND INTRAOCULAR LENS PLACEMENT (IOC);  Surgeon: Tonny Branch, MD;  Location: [facility] ORS;  Service: Ophthalmology;  Laterality: Right;  CDE:20.96  . CHOLECYSTECTOMY    . COLONOSCOPY  02/12/2004   Dr. Gala Romney- L side diverticula, inflammatory  colon polyps  . COLONOSCOPY  01/19/2012   RMR: Prominent changes involving the rectal mucosa consistent with radiation-induced proctitis. Multiple colonic  polyps removed as described above. Sigmoid Diverticulosis/ 1.5 x 2 cm relatively flat ulcerated lesion in cecum/path showed adenocarcinoma of the colon. descending colon polyp with tubular adenoma and one fragment of focal high grade dysplasia.   . COLONOSCOPY  08/28/2012   CXK:GYJEHUDJS proctitis. Colonic diverticulosis/Ulcerations at the surgical anastomosis  path: ulcerated colonic mucosa with prolapse changes, tubular adenoma.   . COLONOSCOPY N/A 08/02/2013   RMR: Colonic polyps -removed as described above. Colonic diverticulosis. Status post reight hemicolectomy. Radiation proctitis- asymptomatic.   Marland Kitchen COLONOSCOPY N/A 05/09/2015   Procedure: COLONOSCOPY;  Surgeon: Daneil Dolin, MD;  Location: [facility];  Service: Endoscopy;  Laterality: N/A;  1200-moved to 1145 [place] to notify pt  . COLONOSCOPY WITH PROPOFOL N/A 11/10/2017   Procedure: COLONOSCOPY WITH PROPOFOL;  Surgeon: Daneil Dolin, MD;  Location: [facility];  Service: Endoscopy;  Laterality: N/A;  . CORONARY ANGIOPLASTY  06/17/14   OM1 POBA  . CORONARY STENT PLACEMENT  01/2011   2.5x28mm Xience drug-eluting stent in ramus intermedius/OMI vessel, 2.5x74mm Promus Element stent in mid LAD artery  . ESOPHAGOGASTRODUODENOSCOPY  10/10/2008   Dr. Tilda Burrow hiatal hernia, normal esophagus, normal stomach  . ESOPHAGOGASTRODUODENOSCOPY  02/12/2004   Dr. Dudley Major- normal   .  ESOPHAGOGASTRODUODENOSCOPY N/A 05/09/2015   Procedure: ESOPHAGOGASTRODUODENOSCOPY (EGD);  Surgeon: Daneil Dolin, MD;  [facility];  Service: Endoscopy;  Laterality: N/A;  . ESOPHAGOGASTRODUODENOSCOPY (EGD) WITH PROPOFOL N/A 11/10/2017   Procedure: ESOPHAGOGASTRODUODENOSCOPY (EGD) WITH PROPOFOL;  Surgeon: Daneil Dolin, MD;  [facility];  Service: Endoscopy;  Laterality: N/A;  9:45am  . GIVENS CAPSULE STUDY N/A 06/03/2015   Procedure: GIVENS CAPSULE STUDY;  Surgeon: Daneil Dolin, MD;  [facility];  Service: Endoscopy;  Laterality: N/A;  0700  . HOT HEMOSTASIS  11/10/2017   Procedure: HOT HEMOSTASIS (ARGON PLASMA COAGULATION/BICAP);  Surgeon: Daneil Dolin, MD;  Location: [facility];  Service: Endoscopy;;  rectum  . KNEE SURGERY     left knee   . LEFT HEART CATHETERIZATION WITH CORONARY ANGIOGRAM N/A 06/17/2014   Procedure: LEFT HEART CATHETERIZATION WITH CORONARY ANGIOGRAM;  Surgeon: Burnell Blanks, MD;  Location: [facility] CATH LAB;  Service: Cardiovascular;  Laterality: N/A;  . PARTIAL COLECTOMY  02/29/2012   Procedure: PARTIAL COLECTOMY;  Surgeon: Adin Hector, MD;  Location: [facility];  Service: General;  Laterality: Right;  . POLYPECTOMY  11/10/2017   Procedure: POLYPECTOMY;  Surgeon: Daneil Dolin, MD;  Location: [facility];  Service: Endoscopy;;  gastric colon  . PROSTATECTOMY    . TONSILLECTOMY    . TRANSURETHRAL RESECTION OF PROSTATE    . VASECTOMY      There were no vitals filed for this visit.  Subjective Assessment - 04/10/19 1411    Subjective  Pt reports he has been compliance iwht HEP every day, averaging 30 reps a day.  No reports of pain or recent falls.         [facility] PT Assessment - 04/10/19 0001      Assessment   Medical Diagnosis  idiopathic PN, gait abnomality, bil foot drop    Referring Provider (PT)  Kathrynn Ducking, MD    Onset Date/Surgical Date  --   declining over 12 years   Next MD Visit  f/u Feb 2021     Prior Therapy  Yes, for balance but without dropfoot diagnosis      Precautions   Precautions  Fall      Transfers   Transfers  Sit to Stand    Sit to Stand  5: Supervision      Standardized Balance Assessment   Standardized Balance Assessment  Five Times Sit to Stand    Five times sit to stand comments   13.98" no HHA            [facility] Adult PT Treatment/Exercise - 04/10/19 0001      Exercises   Exercises  Knee/Hip      Knee/Hip Exercises: Standing   Heel Raises  10 reps    Heel Raises Limitations  cueing for UE balance, no pushing wiht UE    Hip Abduction  Both;10 reps;Knee straight    Hip Extension  Both;10 reps;Knee straight    Lateral Step Up  Right;Left;10 reps;Hand Hold: 2;Step Height: 6"    Forward Step Up  Right;Left;10 reps;Hand Hold: 2;Step Height: 6"      Knee/Hip Exercises: Seated   Other Seated Knee/Hip Exercises  heel and toe raises 20    Sit to Sand  2 sets;5 reps;without UE support;Other (comment)   dependent upon back of knees to fully extend, d         Balance Exercises - 04/10/19 1430      Balance Exercises: Standing   SLS  Eyes open;Solid surface;3 reps   2-3" max    SLS with Vectors  Solid surface;Upper extremity assist 1;3 reps   BLE 3x 5"   Partial Tandem Stance  Eyes open;3 reps;30 secs    Sidestepping  2 reps;Theraband   GTB around thigh down blue line   Sit to Stand Time  5STS 13.98"          PT Short Term Goals - 04/06/19 1234      PT SHORT TERM GOAL #1   Title  Pt will be independent with HEP, update as needed and perform consistently.    Time  2    Period  Weeks    Status  New    Target Date  04/20/19      PT SHORT TERM GOAL #2   Title  Pt will improve ankle muscle strength by 1/2 grade to improve ability to perform funcitonal activities and reduce risk for falls.    Time  2    Period  Weeks    Status  New      PT SHORT TERM GOAL #3   Title  Pt will maintain SLS for 15 sec to demo improved functional strength and  reduce risk for falls.    Time  2    Period  Weeks    Status  New        PT Long Term Goals - 04/06/19 1236      PT LONG TERM GOAL #1   Title  Pt will improve muscle strength by 1 grade to improve ability to perform funcitonal activities and reduce risk for falls.    Time  4    Period  Weeks    Status  New    Target Date  05/04/19      PT LONG TERM GOAL #2   Title  Pt will improve FGA score to >19 to reduce risk for falls and improve safety with ambulation around the community.    Time  4    Period  Weeks    Status  New      PT LONG TERM GOAL #3   Title  Pt will maintain SLS for 30 sec to demo improved functional strength and reduce risk for falls.    Time  4    Period  Weeks    Status  New      PT LONG TERM GOAL #4   Title  Pt will perform 5x STS in 14.8 sec or < to indicate improve funcitonal strength and ability to move around home safely.    Time  4    Period  Weeks    Status  New            Plan - 04/10/19 1505    Clinical Impression Statement  Reviewed goals and assured compliance with HEP.  Pt able to demonstrate and  verbalized compliance daily at 30 reps.  Session focus on hip, knee and gastroc strengthening exercises as well as balance training.  Pt able to demonstrate 5STS in 13.98" wihtout HHA, dependent upon back of knee on chair for full knee extension and inability for eccentric control descending due to LE weakness.  Balance training complete inside // bars, pt able to self correct LOB episodes but required HHA for safety.  Pt able to demonstrate appropriate mechanics wiht all exercises, min cueing for form and technqiue wiht new exercise.  Pt given advanced HEP printout to add STS and balance at home infront of counter, verbalized understanding for safety.    Personal Factors and Comorbidities  Age;Comorbidity 3+;Fitness;Past/Current Experience;Time since onset of injury/illness/exacerbation    Comorbidities  see above    Examination-Activity Limitations   Locomotion Level;Transfers    Examination-Participation Restrictions  Community Activity;Yard Work    [employer]  Evolving/Moderate complexity    Clinical Decision Making  Moderate    Rehab Potential  Good    PT Frequency  2x / week    PT Duration  4 weeks    PT Treatment/Interventions  ADLs/Self Care Home Management;Aquatic Therapy;Biofeedback;Cryotherapy;Electrical Stimulation;Iontophoresis 4mg /ml Dexamethasone;Moist Heat;Ultrasound;DME Instruction;Gait training;Stair training;Functional mobility training;Therapeutic activities;Therapeutic exercise;Balance training;Neuromuscular re-education;Patient/family education;Orthotic Fit/Training;Manual techniques;Passive range of motion;Dry needling;Taping;Joint Manipulations    PT Next Visit Plan  Next session begin tandem stance on foam.  Begin hip and calf strengthening. Challenge balance. Gait training with and without SPC. Update HEP weekly.    PT Home Exercise Plan  Eval: seated heel and toe raises; 8/11: STS, SLS and tandem stance       Patient will benefit from skilled therapeutic intervention in order to improve the following deficits and impairments:  Abnormal gait, Decreased balance, Decreased coordination, Decreased range of motion, Decreased strength, Difficulty walking, Impaired sensation  Visit Diagnosis: 1. Unsteadiness on feet   2. Other abnormalities of gait and mobility   3. Muscle weakness (generalized)        Problem List Patient Active Problem List   Diagnosis Date Noted  . Bilateral foot-drop 04/02/2019  . Decreased appetite 08/18/2017  . Loss of weight 08/18/2017  . Dark stools 08/18/2017  . Morbid obesity ([place]) 05/02/2017  . Vitamin D deficiency 11/26/2015  . CLL (chronic lymphocytic leukemia) ([place]) 07/21/2015  . Mucosal abnormality of esophagus   . Mucosal abnormality of stomach   . Multiple gastric polyps   . History of colon cancer   . Diverticulosis of colon without hemorrhage   .  Family history of prostate cancer   . Family history of colon cancer   . Family history of stomach cancer   . Benign prostatic hyperplasia with urinary obstruction 09/12/2014  . H/O prostate cancer 08/20/2014  . Radiation proctitis 08/19/2014  . H/O agent Orange exposure 07/01/2014  . Unstable angina ([place]) 06/16/2014  . Shingles outbreak, right anterior thoracic 01/28/2014  . Gait abnormality 12/18/2013  . Viral gastroenteritis 11/26/2013  . Dyslipidemia 02/12/2013  . Peripheral neuropathy 10/14/2012  . CAD (coronary artery disease) 10/14/2012  . HTN (hypertension) 10/14/2012  . Hx of adenomatous colonic polyps 07/26/2012  . Folic acid deficiency 74/25/9563  . Iron deficiency 05/02/2012  . Cancer of ascending colon ([place]) 03/29/2012  . GERD 03/09/2010  . PREMATURE VENTRICULAR CONTRACTIONS 04/30/2009   Ihor Austin, LPTA; [place]  Aldona Lento 04/10/2019, 3:16 PM  [place], [place], [address] Phone: [phone]   Fax:  [phone]  Name: David Pugh. MRN: [id number] Date of Birth: 25-Jul-1935

## 2019-04-10 NOTE — Patient Instructions (Signed)
Functional Quadriceps: Sit to Stand    Sit on edge of chair, feet flat on floor. Stand upright, extending knees fully. Repeat 10 times per set. Do 2 sets per session.   http://orth.exer.us/734   Copyright  VHI. All rights reserved.   Tandem Stance    Right foot in front of left, heel touching toe both feet "straight ahead". Stand on Foot Triangle of Support with both feet.  Balance in this position 30 seconds. Do with left foot in front of right.  Copyright  VHI. All rights reserved.   SINGLE LIMB STANCE    Stance: single leg on floor. Raise leg. Hold 30 seconds. Repeat with other leg. 6 days per week  Copyright  VHI. All rights reserved.

## 2019-04-11 ENCOUNTER — Ambulatory Visit (HOSPITAL_COMMUNITY): Payer: Medicare Other

## 2019-04-11 ENCOUNTER — Encounter (HOSPITAL_COMMUNITY): Payer: Self-pay

## 2019-04-11 DIAGNOSIS — R2689 Other abnormalities of gait and mobility: Secondary | ICD-10-CM

## 2019-04-11 DIAGNOSIS — R2681 Unsteadiness on feet: Secondary | ICD-10-CM | POA: Diagnosis not present

## 2019-04-11 DIAGNOSIS — M6281 Muscle weakness (generalized): Secondary | ICD-10-CM

## 2019-04-11 NOTE — Patient Instructions (Signed)
Dorsiflexion (Eccentric), (Resistance Band)    Pull foot up against resistance band. Slowly release for 3-5 seconds. Use green resistance band. 20 reps per set, 1 sets per day, 4 days per week.  http://ecce.exer.us/0   Copyright  VHI. All rights reserved.

## 2019-04-11 NOTE — Therapy (Signed)
[facility] [place], [place], [address] Phone: [phone]   Fax:  [phone]  Physical Therapy Treatment  Patient Details  Name: David Pugh. MRN: [id number] Date of Birth: Feb 10, 1935 Referring Provider (PT): Kathrynn Ducking, MD   Encounter Date: 04/11/2019  PT End of Session - 04/11/19 0919    Visit Number  3    Number of Visits  8    Date for PT Re-Evaluation  05/04/19    Authorization Type  [employer]; Secondary: [employer] for life    Authorization Time Period  04/06/19 to 05/04/19    Authorization - Visit Number  3    Authorization - Number of Visits  10    PT Start Time  0914    PT Stop Time  0956    PT Time Calculation (min)  42 min    Equipment Utilized During Treatment  Gait belt    Activity Tolerance  Patient tolerated treatment well    Behavior During Therapy  WFL for tasks assessed/performed       Past Medical History:  Diagnosis Date  . Abnormality of gait 12/18/2013  . Arthritis   . Bilateral foot-drop 04/02/2019  . Biliary dyskinesia   . CAD (coronary artery disease) 2012   a. DES x2 in ramus/OM and mid LAD in 2012  b. balloon angioplaty of OM 2015  . CLL (chronic lymphocytic leukemia) ([place])   . Colon cancer [facility]) 2013   Right hemicolectomy, did not tolerate chemotherapy  . Colon polyps    tubular adenomas  . Diverticula of colon 2010  . Essential hypertension   . Exposure to [employer]   . Folic acid deficiency   . Gastroparesis 2014  . GERD (gastroesophageal reflux disease)   . Glaucoma   . Heart palpitations 2014  . Hiatal hernia 2010  . History of anemia as a child   . History of kidney stones   . Iron deficiency 2013  . Myocardial infarction ([place]) 2012  . Peripheral neuropathy   . Prostate cancer [facility]) 2010   Radiation, surgery  . PTSD (post-traumatic stress disorder)   . Radiation proctitis   . Rosacea     Past Surgical History:  Procedure Laterality Date  . CARDIAC  CATHETERIZATION  02/13/2011   [facility], [facility] cardiology  . CATARACT EXTRACTION     2011  . CATARACT EXTRACTION W/PHACO Right 08/06/2013   Procedure: CATARACT EXTRACTION PHACO AND INTRAOCULAR LENS PLACEMENT (IOC);  Surgeon: Tonny Branch, MD;  Location: [facility] ORS;  Service: Ophthalmology;  Laterality: Right;  CDE:20.96  . CHOLECYSTECTOMY    . COLONOSCOPY  02/12/2004   Dr. Gala Romney- L side diverticula, inflammatory  colon polyps  . COLONOSCOPY  01/19/2012   RMR: Prominent changes involving the rectal mucosa consistent with radiation-induced proctitis. Multiple colonic  polyps removed as described above. Sigmoid Diverticulosis/ 1.5 x 2 cm relatively flat ulcerated lesion in cecum/path showed adenocarcinoma of the colon. descending colon polyp with tubular adenoma and one fragment of focal high grade dysplasia.   . COLONOSCOPY  08/28/2012   OYD:XAJOINOMV proctitis. Colonic diverticulosis/Ulcerations at the surgical anastomosis  path: ulcerated colonic mucosa with prolapse changes, tubular adenoma.   . COLONOSCOPY N/A 08/02/2013   RMR: Colonic polyps -removed as described above. Colonic diverticulosis. Status post reight hemicolectomy. Radiation proctitis- asymptomatic.   Marland Kitchen COLONOSCOPY N/A 05/09/2015   Procedure: COLONOSCOPY;  Surgeon: Daneil Dolin, MD;  Location: [facility];  Service: Endoscopy;  Laterality: N/A;  1200-moved to [address] to notify pt  . COLONOSCOPY WITH PROPOFOL N/A 11/10/2017   Procedure: COLONOSCOPY WITH PROPOFOL;  Surgeon: Daneil Dolin, MD;  Location: [facility];  Service: Endoscopy;  Laterality: N/A;  . CORONARY ANGIOPLASTY  06/17/14   OM1 POBA  . CORONARY STENT PLACEMENT  01/2011   2.5x29mm Xience drug-eluting stent in ramus intermedius/OMI vessel, 2.5x45mm Promus Element stent in mid LAD artery  . ESOPHAGOGASTRODUODENOSCOPY  10/10/2008   Dr. Tilda Burrow hiatal hernia, normal esophagus, normal stomach  . ESOPHAGOGASTRODUODENOSCOPY  02/12/2004   Dr. Dudley Major- normal   .  ESOPHAGOGASTRODUODENOSCOPY N/A 05/09/2015   Procedure: ESOPHAGOGASTRODUODENOSCOPY (EGD);  Surgeon: Daneil Dolin, MD;  [facility];  Service: Endoscopy;  Laterality: N/A;  . ESOPHAGOGASTRODUODENOSCOPY (EGD) WITH PROPOFOL N/A 11/10/2017   Procedure: ESOPHAGOGASTRODUODENOSCOPY (EGD) WITH PROPOFOL;  Surgeon: Daneil Dolin, MD;  [facility]: [facility];  Service: Endoscopy;  Laterality: N/A;  9:45am  . GIVENS CAPSULE STUDY N/A 06/03/2015   Procedure: GIVENS CAPSULE STUDY;  Surgeon: Daneil Dolin, MD;  Location: [facility];  Service: Endoscopy;  Laterality: N/A;  0700  . HOT HEMOSTASIS  11/10/2017   Procedure: HOT HEMOSTASIS (ARGON PLASMA COAGULATION/BICAP);  Surgeon: Daneil Dolin, MD;  Location: [facility];  Service: Endoscopy;;  rectum  . KNEE SURGERY     left knee   . LEFT HEART CATHETERIZATION WITH CORONARY ANGIOGRAM N/A 06/17/2014   Procedure: LEFT HEART CATHETERIZATION WITH CORONARY ANGIOGRAM;  Surgeon: Burnell Blanks, MD;  Location[facility] CATH LAB;  Service: Cardiovascular;  Laterality: N/A;  . PARTIAL COLECTOMY  02/29/2012   Procedure: PARTIAL COLECTOMY;  Surgeon: Adin Hector, MD;  Location: [facility];  Service: General;  Laterality: Right;  . POLYPECTOMY  11/10/2017   Procedure: POLYPECTOMY;  Surgeon: Daneil Dolin, MD;  Location: [facility];  Service: Endoscopy;;  gastric colon  . PROSTATECTOMY    . TONSILLECTOMY    . TRANSURETHRAL RESECTION OF PROSTATE    . VASECTOMY      There were no vitals filed for this visit.  Subjective Assessment - 04/11/19 0917    Subjective  Pt stated he has difficulty wiht the balance activities at home, able to hold tandem stance for 3" prior LOB at home.  Completed exercise by counter at home.    Patient Stated Goals  regain balance    Currently in Pain?  No/denies                       [facility] Adult PT Treatment/Exercise - 04/11/19 0001      Exercises   Exercises  Knee/Hip      Knee/Hip Exercises:  Machines for Strengthening   Cybex Leg Press  2x 10 30#      Knee/Hip Exercises: Standing   Heel Raises  15 reps    Heel Raises Limitations  slant     Hip Abduction  Both;10 reps;Knee straight    Abduction Limitations  with GTB around thigh    Hip Extension  Both;10 reps;Knee straight    Extension Limitations  with GTB    Lateral Step Up  Right;Left;10 reps;Hand Hold: 2;Step Height: 6"    Forward Step Up  Right;Left;10 reps;Hand Hold: 2;Step Height: 6"    Step Down  Right;Left;10 reps;Hand Hold: 2;Step Height: 6"    Functional Squat  10 reps    Functional Squat Limitations  front of chair      Knee/Hip Exercises: Seated   Other  Seated Knee/Hip Exercises  toe raises 20x    Other Seated Knee/Hip Exercises  GTB dorsiflexion 10x 5" holds BLE    Sit to Sand  10 reps;without UE support   eccentric control         Balance Exercises - 04/11/19 0951      Balance Exercises: Standing   Tandem Stance  Eyes open;Foam/compliant surface;2 reps;30 secs;Intermittent upper extremity support    SLS  Eyes open;Solid surface;3 reps    SLS with Vectors  Solid surface;Upper extremity assist 1;3 reps    Sidestepping  2 reps;Theraband   GTB around thigh         PT Short Term Goals - 04/06/19 1234      PT SHORT TERM GOAL #1   Title  Pt will be independent with HEP, update as needed and perform consistently.    Time  2    Period  Weeks    Status  New    Target Date  04/20/19      PT SHORT TERM GOAL #2   Title  Pt will improve ankle muscle strength by 1/2 grade to improve ability to perform funcitonal activities and reduce risk for falls.    Time  2    Period  Weeks    Status  New      PT SHORT TERM GOAL #3   Title  Pt will maintain SLS for 15 sec to demo improved functional strength and reduce risk for falls.    Time  2    Period  Weeks    Status  New        PT Long Term Goals - 04/06/19 1236      PT LONG TERM GOAL #1   Title  Pt will improve muscle strength by 1 grade to  improve ability to perform funcitonal activities and reduce risk for falls.    Time  4    Period  Weeks    Status  New    Target Date  05/04/19      PT LONG TERM GOAL #2   Title  Pt will improve FGA score to >19 to reduce risk for falls and improve safety with ambulation around the community.    Time  4    Period  Weeks    Status  New      PT LONG TERM GOAL #3   Title  Pt will maintain SLS for 30 sec to demo improved functional strength and reduce risk for falls.    Time  4    Period  Weeks    Status  New      PT LONG TERM GOAL #4   Title  Pt will perform 5x STS in 14.8 sec or < to indicate improve funcitonal strength and ability to move around home safely.    Time  4    Period  Weeks    Status  New            Plan - 04/11/19 1003    Clinical Impression Statement  Session focus with LE strengthening and balance training.  Able to progress to dynamic surface with partial tandem stance with moderate difficulty and intermittent HHA required for LOB episodes, pt able to maintain 10" without HHA prior LOB.  Also progressed functional strengthening wiht additional step down off 6 in step, functional squats infront of chair and leg press cybex machine.  Added isometric dorsiflexion for strengthening, pt given GTB and printout to add to HEP at  home.  EOS limited by fatigue, no reports of pain.    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  Evolving/Moderate complexity    Clinical Decision Making  Moderate    Rehab Potential  Good    PT Frequency  2x / week    PT Duration  4 weeks    PT Treatment/Interventions  ADLs/Self Care Home Management;Aquatic Therapy;Biofeedback;Cryotherapy;Electrical  Stimulation;Iontophoresis 4mg /ml Dexamethasone;Moist Heat;Ultrasound;DME Instruction;Gait training;Stair training;Functional mobility training;Therapeutic activities;Therapeutic exercise;Balance training;Neuromuscular re-education;Patient/family education;Orthotic Fit/Training;Manual techniques;Passive range of motion;Dry needling;Taping;Joint Manipulations    PT Next Visit Plan  Continue hip and calf strengthening. Challenge balance. Gait training with and without SPC. Update HEP weekly.    PT Home Exercise Plan  Eval: seated heel and toe raises; 8/11: STS, SLS and tandem stance; 8/12: GTB and dorsiflexion printout       Patient will benefit from skilled therapeutic intervention in order to improve the following deficits and impairments:  Abnormal gait, Decreased balance, Decreased coordination, Decreased range of motion, Decreased strength, Difficulty walking, Impaired sensation  Visit Diagnosis: 1. Other abnormalities of gait and mobility   2. Muscle weakness (generalized)   3. Unsteadiness on feet        Problem List Patient Active Problem List   Diagnosis Date Noted  . Bilateral foot-drop 04/02/2019  . Decreased appetite 08/18/2017  . Loss of weight 08/18/2017  . Dark stools 08/18/2017  . Morbid obesity ([place]) 05/02/2017  . Vitamin D deficiency 11/26/2015  . CLL (chronic lymphocytic leukemia) ([place]) 07/21/2015  . Mucosal abnormality of esophagus   . Mucosal abnormality of stomach   . Multiple gastric polyps   . History of colon cancer   . Diverticulosis of colon without hemorrhage   . Family history of prostate cancer   . Family history of colon cancer   . Family history of stomach cancer   . Benign prostatic hyperplasia with urinary obstruction 09/12/2014  . H/O prostate cancer 08/20/2014  . Radiation proctitis 08/19/2014  . H/O agent Orange exposure 07/01/2014  . Unstable angina ([place]) 06/16/2014  . Shingles outbreak, right anterior thoracic 01/28/2014  . Gait abnormality  12/18/2013  . Viral gastroenteritis 11/26/2013  . Dyslipidemia 02/12/2013  . Peripheral neuropathy 10/14/2012  . CAD (coronary artery disease) 10/14/2012  . HTN (hypertension) 10/14/2012  . Hx of adenomatous colonic polyps 07/26/2012  . Folic acid deficiency 70/35/0093  . Iron deficiency 05/02/2012  . Cancer of ascending colon ([place]) 03/29/2012  . GERD 03/09/2010  . PREMATURE VENTRICULAR CONTRACTIONS 04/30/2009   Ihor Austin, [place]; Evangeline  Aldona Lento 04/11/2019, 1:07 PM  [facility] [address] [place], [place], [address] Phone: [phone]   Fax:  [phone]  Name: David Pugh. MRN: [id number] Date of Birth: 1934-10-28

## 2019-04-18 ENCOUNTER — Ambulatory Visit (HOSPITAL_COMMUNITY): Payer: Medicare Other

## 2019-04-18 ENCOUNTER — Other Ambulatory Visit: Payer: Self-pay

## 2019-04-18 ENCOUNTER — Encounter (HOSPITAL_COMMUNITY): Payer: Self-pay

## 2019-04-18 DIAGNOSIS — R2681 Unsteadiness on feet: Secondary | ICD-10-CM

## 2019-04-18 DIAGNOSIS — M6281 Muscle weakness (generalized): Secondary | ICD-10-CM

## 2019-04-18 DIAGNOSIS — R2689 Other abnormalities of gait and mobility: Secondary | ICD-10-CM

## 2019-04-18 NOTE — Therapy (Signed)
David Pugh, [place], [address] Phone: [phone]   Fax:  [phone]  Physical Therapy Treatment  Patient Details  Name: David Pugh. MRN: [id number] Date of Birth: 1935/04/06 Referring Provider (PT): David Ducking, MD   Encounter Date: 04/18/2019  PT End of Session - 04/18/19 1320    Visit Number  4    Number of Visits  8    Date for PT Re-Evaluation  05/04/19    Authorization Type  [employer]; Secondary: [employer] for life    Authorization Time Period  04/06/19 to 05/04/19    Authorization - Visit Number  4    Authorization - Number of Visits  10    PT Start Time  6389    PT Stop Time  1400    PT Time Calculation (min)  45 min    Activity Tolerance  Patient tolerated treatment well    Behavior During Therapy  WFL for tasks assessed/performed       Past Medical History:  Diagnosis Date  . Abnormality of gait 12/18/2013  . Arthritis   . Bilateral foot-drop 04/02/2019  . Biliary dyskinesia   . CAD (coronary artery disease) 2012   a. DES x2 in ramus/OM and mid LAD in 2012  b. balloon angioplaty of OM 2015  . CLL (chronic lymphocytic leukemia) ([place])   . Colon cancer [facility]) 2013   Right hemicolectomy, did not tolerate chemotherapy  . Colon polyps    tubular adenomas  . Diverticula of colon 2010  . Essential hypertension   . Exposure to [employer]   . Folic acid deficiency   . Gastroparesis 2014  . GERD (gastroesophageal reflux disease)   . Glaucoma   . Heart palpitations 2014  . Hiatal hernia 2010  . History of anemia as a child   . History of kidney stones   . Iron deficiency 2013  . Myocardial infarction ([place]) 2012  . Peripheral neuropathy   . Prostate cancer [facility]) 2010   Radiation, surgery  . PTSD (post-traumatic stress disorder)   . Radiation proctitis   . Rosacea     Past Surgical History:  Procedure Laterality Date  . CARDIAC CATHETERIZATION  02/13/2011   [facility], [facility]  cardiology  . CATARACT EXTRACTION     2011  . CATARACT EXTRACTION W/PHACO Right 08/06/2013   Procedure: CATARACT EXTRACTION PHACO AND INTRAOCULAR LENS PLACEMENT (IOC);  Surgeon: David Branch, MD;  Location: [facility];  Service: Ophthalmology;  Laterality: Right;  CDE:20.96  . CHOLECYSTECTOMY    . COLONOSCOPY  02/12/2004   Dr. Gala Pugh- L side diverticula, inflammatory  colon polyps  . COLONOSCOPY  01/19/2012   RMR: Prominent changes involving the rectal mucosa consistent with radiation-induced proctitis. Multiple colonic  polyps removed as described above. Sigmoid Diverticulosis/ 1.5 x 2 cm relatively flat ulcerated lesion in cecum/path showed adenocarcinoma of the colon. descending colon polyp with tubular adenoma and one fragment of focal high grade dysplasia.   . COLONOSCOPY  08/28/2012   HTD:[id number] proctitis. Colonic diverticulosis/Ulcerations at the surgical anastomosis  path: ulcerated colonic mucosa with prolapse changes, tubular adenoma.   . COLONOSCOPY N/A 08/02/2013   RMR: Colonic polyps -removed as described above. Colonic diverticulosis. Status post reight hemicolectomy. Radiation proctitis- asymptomatic.   Marland Kitchen COLONOSCOPY N/A 05/09/2015   Procedure: COLONOSCOPY;  Surgeon: David Dolin, MD;  Location: [facility];  Service: Endoscopy;  Laterality: N/A;  1200-moved to [address] to notify pt  . COLONOSCOPY  WITH PROPOFOL N/A 11/10/2017   Procedure: COLONOSCOPY WITH PROPOFOL;  Surgeon: David Dolin, MD;  Location: [facility];  Service: Endoscopy;  Laterality: N/A;  . CORONARY ANGIOPLASTY  06/17/14   OM1 POBA  . CORONARY STENT PLACEMENT  01/2011   2.5x64mm Xience drug-eluting stent in ramus intermedius/OMI vessel, 2.5x103mm Promus Element stent in mid LAD artery  . ESOPHAGOGASTRODUODENOSCOPY  10/10/2008   Dr. Tilda Pugh hiatal hernia, normal esophagus, normal stomach  . ESOPHAGOGASTRODUODENOSCOPY  02/12/2004   Dr. Dudley Pugh- normal   . ESOPHAGOGASTRODUODENOSCOPY N/A 05/09/2015   Procedure:  ESOPHAGOGASTRODUODENOSCOPY (EGD);  Surgeon: David Dolin, MD;  [facility];  Service: Endoscopy;  Laterality: N/A;  . ESOPHAGOGASTRODUODENOSCOPY (EGD) WITH PROPOFOL N/A 11/10/2017   Procedure: ESOPHAGOGASTRODUODENOSCOPY (EGD) WITH PROPOFOL;  Surgeon: David Dolin, MD;  Location: [facility];  Service: Endoscopy;  Laterality: N/A;  9:45am  . GIVENS CAPSULE STUDY N/A 06/03/2015   Procedure: GIVENS CAPSULE STUDY;  Surgeon: David Dolin, MD;  Location: [facility];  Service: Endoscopy;  Laterality: N/A;  0700  . HOT HEMOSTASIS  11/10/2017   Procedure: HOT HEMOSTASIS (ARGON PLASMA COAGULATION/BICAP);  Surgeon: David Dolin, MD;  Location: [facility];  Service: Endoscopy;;  rectum  . KNEE SURGERY     left knee   . LEFT HEART CATHETERIZATION WITH CORONARY ANGIOGRAM N/A 06/17/2014   Procedure: LEFT HEART CATHETERIZATION WITH CORONARY ANGIOGRAM;  Surgeon: David Blanks, MD;  Location: [facility] CATH LAB;  Service: Cardiovascular;  Laterality: N/A;  . PARTIAL COLECTOMY  02/29/2012   Procedure: PARTIAL COLECTOMY;  Surgeon: David Hector, MD;  Location: [facility];  Service: General;  Laterality: Right;  . POLYPECTOMY  11/10/2017   Procedure: POLYPECTOMY;  Surgeon: David Dolin, MD;  Location: [facility];  Service: Endoscopy;;  gastric colon  . PROSTATECTOMY    . TONSILLECTOMY    . TRANSURETHRAL RESECTION OF PROSTATE    . VASECTOMY      There were no vitals filed for this visit.  Subjective Assessment - 04/18/19 1315    Subjective  Pt reports his knees hurt after performing leg press machine and he had swelling throughout the weekend. Pt reports he dropped a 6.6# dumbbell on his foot this weekend, it hurt instantly, but no longer feels pain and says they are black.    Limitations  Walking;House hold activities    How long can you sit comfortably?  no issues    How long can you stand comfortably?  no issues; back limits    How long can you walk comfortably?  no issues  when uses walking stick/SPC in community/outside    Diagnostic tests  none    Patient Stated Goals  regain balance    Currently in Pain?  No/denies           [facility] Adult PT Treatment/Exercise - 04/18/19 0001      Knee/Hip Exercises: Stretches   Gastroc Stretch  Both;3 reps;30 seconds      Knee/Hip Exercises: Standing   Heel Raises  15 reps    Heel Raises Limitations  15 toe raises    Other Standing Knee Exercises  sidestepping with GTB, //bars, x2RT    Other Standing Knee Exercises  toe raised walking, x1RT blue line          Balance Exercises - 04/18/19 1330      Balance Exercises: Standing   SLS  Eyes open;Solid surface;3 reps;30 secs   2-3 sec increments at most  SLS with Vectors  Solid surface;Upper extremity assist 1;5 reps   forward, lateral, backward   Gait with Head Turns  Forward   up/down and R/L, 50 ftx2 RT   Step Over Hurdles / Cones  4 6" hurdles, // bars, x3RT    Cone Rotation Limitations  flat foot taps to 6" step, no UE support    Other Standing Exercises  partial tandem, paloff press with 2#, x10 reps with each leg back; partial tandem, BUE flexion with 2#, x10 reps each leg back        PT Education - 04/18/19 1320    Education Details  Continue HEP, exercise technique, Bio-tech information for AFO prescription from MD    Person(s) Educated  Patient    Methods  Explanation    Comprehension  Verbalized understanding       PT Short Term Goals - 04/18/19 1408      PT SHORT TERM GOAL #1   Title  Pt will be independent with HEP, update as needed and perform consistently.    Time  2    Period  Weeks    Status  On-going    Target Date  04/20/19      PT SHORT TERM GOAL #2   Title  Pt will improve ankle muscle strength by 1/2 grade to improve ability to perform funcitonal activities and reduce risk for falls.    Time  2    Period  Weeks    Status  On-going      PT SHORT TERM GOAL #3   Title  Pt will maintain SLS for 15 sec to demo improved  functional strength and reduce risk for falls.    Time  2    Period  Weeks    Status  On-going        PT Long Term Goals - 04/18/19 1408      PT LONG TERM GOAL #1   Title  Pt will improve muscle strength by 1 grade to improve ability to perform funcitonal activities and reduce risk for falls.    Time  4    Period  Weeks    Status  On-going      PT LONG TERM GOAL #2   Title  Pt will improve FGA score to >19 to reduce risk for falls and improve safety with ambulation around the community.    Time  4    Period  Weeks    Status  On-going      PT LONG TERM GOAL #3   Title  Pt will maintain SLS for 30 sec to demo improved functional strength and reduce risk for falls.    Time  4    Period  Weeks    Status  On-going      PT LONG TERM GOAL #4   Title  Pt will perform 5x STS in 14.8 sec or < to indicate improve funcitonal strength and ability to move around home safely.    Time  4    Period  Weeks    Status  On-going            Plan - 04/18/19 1407    Clinical Impression Statement  Continued with pt's established POC this date. Performed stepping over hurdles in // bars, pt with increased difficulty with R foot stepping relying on L SLS and occasionally requires UE assist on bars to prevent loss of balance. Straight line gait with head turns up/down and R/L demonstrating decreased bil  foot clearance, veering R and L with occasional protective stepping laterally to prevent loss of balance, contact guard assist and no physical assistance from therapist to prevent falls. Pt with increased difficulty performing toe raised walking for dorsiflexion strengthening, requiring min assist to complete and 1 loss of balance due to L knee buckling requiring max assist from therapist to prevent loss of balance. Added UE paloff press and flexion in partial tandem stance to challenge balance, requiring intermediate UE return to parallel bars, no noticeable difference between R and L leg back.  Provided pt with bio-tech information to fill AFO prescription from MD. Continue to progress as able.    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  Evolving/Moderate complexity    Rehab Potential  Good    PT Frequency  2x / week    PT Duration  4 weeks    PT Treatment/Interventions  ADLs/Self Care Home Management;Aquatic Therapy;Biofeedback;Cryotherapy;Electrical Stimulation;Iontophoresis 4mg /ml Dexamethasone;Moist Heat;Ultrasound;DME Instruction;Gait training;Stair training;Functional mobility training;Therapeutic activities;Therapeutic exercise;Balance training;Neuromuscular re-education;Patient/family education;Orthotic Fit/Training;Manual techniques;Passive range of motion;Dry needling;Taping;Joint Manipulations    PT Next Visit Plan  Continue hip and calf strengthening, static and dynamic balance exercises. Gait training with and without SPC.    PT Home Exercise Plan  Eval: seated heel and toe raises; 8/11: STS, SLS and tandem stance; 8/12: GTB and dorsiflexion printout    Consulted and Agree with Plan of Care  Patient       Patient will benefit from skilled therapeutic intervention in order to improve the following deficits and impairments:  Abnormal gait, Decreased balance, Decreased coordination, Decreased range of motion, Decreased strength, Difficulty walking, Impaired sensation  Visit Diagnosis: 1. Other abnormalities of gait and mobility   2. Muscle weakness (generalized)   3. Unsteadiness on feet        Problem List Patient Active Problem List   Diagnosis Date Noted  . Bilateral foot-drop 04/02/2019  . Decreased appetite 08/18/2017  . Loss of weight 08/18/2017  . Dark stools  08/18/2017  . Morbid obesity ([place]) 05/02/2017  . Vitamin D deficiency 11/26/2015  . CLL (chronic lymphocytic leukemia) ([place]) 07/21/2015  . Mucosal abnormality of esophagus   . Mucosal abnormality of stomach   . Multiple gastric polyps   . History of colon cancer   . Diverticulosis of colon without hemorrhage   . Family history of prostate cancer   . Family history of colon cancer   . Family history of stomach cancer   . Benign prostatic hyperplasia with urinary obstruction 09/12/2014  . H/O prostate cancer 08/20/2014  . Radiation proctitis 08/19/2014  . H/O agent Orange exposure 07/01/2014  . Unstable angina ([place]) 06/16/2014  . Shingles outbreak, right anterior thoracic 01/28/2014  . Gait abnormality 12/18/2013  . Viral gastroenteritis 11/26/2013  . Dyslipidemia 02/12/2013  . Peripheral neuropathy 10/14/2012  . CAD (coronary artery disease) 10/14/2012  . HTN (hypertension) 10/14/2012  . Hx of adenomatous colonic polyps 07/26/2012  . Folic acid deficiency 24/04/7352  . Iron deficiency 05/02/2012  . Cancer of ascending colon ([place]) 03/29/2012  . GERD 03/09/2010  . PREMATURE VENTRICULAR CONTRACTIONS 04/30/2009      Talbot Grumbling PT, DPT 04/18/19, 2:10 PM [place], [place], [address] Phone: [phone]   Fax:  [phone]  Name: Asmar Brozek. MRN: [id number] Date of Birth: March 24, 1935

## 2019-04-20 ENCOUNTER — Encounter (HOSPITAL_COMMUNITY): Payer: Self-pay

## 2019-04-20 ENCOUNTER — Other Ambulatory Visit: Payer: Self-pay

## 2019-04-20 ENCOUNTER — Ambulatory Visit (HOSPITAL_COMMUNITY): Payer: Medicare Other

## 2019-04-20 DIAGNOSIS — R2681 Unsteadiness on feet: Secondary | ICD-10-CM

## 2019-04-20 DIAGNOSIS — R2689 Other abnormalities of gait and mobility: Secondary | ICD-10-CM

## 2019-04-20 DIAGNOSIS — M6281 Muscle weakness (generalized): Secondary | ICD-10-CM

## 2019-04-20 NOTE — Therapy (Signed)
[place], [place], [address] Phone: [phone]   Fax:  [phone]  Physical Therapy Treatment  Patient Details  Name: David Pugh. MRN: [id number] Date of Birth: 12/28/1934 Referring Provider (PT): Kathrynn Ducking, MD   Encounter Date: 04/20/2019  PT End of Session - 04/20/19 1358    Visit Number  5    Number of Visits  8    Date for PT Re-Evaluation  05/04/19    Authorization Type  [employer]; Secondary: [employer] for life    Authorization Time Period  04/06/19 to 05/04/19    Authorization - Visit Number  5    Authorization - Number of Visits  10    PT Start Time  [id number]    PT Stop Time  1355    PT Time Calculation (min)  50 min    Equipment Utilized During Treatment  Gait belt    Activity Tolerance  Patient tolerated treatment well    Behavior During Therapy  WFL for tasks assessed/performed       Past Medical History:  Diagnosis Date  . Abnormality of gait 12/18/2013  . Arthritis   . Bilateral foot-drop 04/02/2019  . Biliary dyskinesia   . CAD (coronary artery disease) 2012   a. DES x2 in ramus/OM and mid LAD in 2012  b. balloon angioplaty of OM 2015  . CLL (chronic lymphocytic leukemia) ([place])   . Colon cancer [facility]) 2013   Right hemicolectomy, did not tolerate chemotherapy  . Colon polyps    tubular adenomas  . Diverticula of colon 2010  . Essential hypertension   . Exposure to [employer]   . Folic acid deficiency   . Gastroparesis 2014  . GERD (gastroesophageal reflux disease)   . Glaucoma   . Heart palpitations 2014  . Hiatal hernia 2010  . History of anemia as a child   . History of kidney stones   . Iron deficiency 2013  . Myocardial infarction ([place]) 2012  . Peripheral neuropathy   . Prostate cancer [facility]) 2010   Radiation, surgery  . PTSD (post-traumatic stress disorder)   . Radiation proctitis   . Rosacea     Past Surgical History:  Procedure Laterality Date  . CARDIAC  CATHETERIZATION  02/13/2011   [facility], [facility] cardiology  . CATARACT EXTRACTION     2011  . CATARACT EXTRACTION W/PHACO Right 08/06/2013   Procedure: CATARACT EXTRACTION PHACO AND INTRAOCULAR LENS PLACEMENT (IOC);  Surgeon: Tonny Branch, MD;  Location: [facility] ORS;  Service: Ophthalmology;  Laterality: Right;  CDE:20.96  . CHOLECYSTECTOMY    . COLONOSCOPY  02/12/2004   Dr. Gala Romney- L side diverticula, inflammatory  colon polyps  . COLONOSCOPY  01/19/2012   RMR: Prominent changes involving the rectal mucosa consistent with radiation-induced proctitis. Multiple colonic  polyps removed as described above. Sigmoid Diverticulosis/ 1.5 x 2 cm relatively flat ulcerated lesion in cecum/path showed adenocarcinoma of the colon. descending colon polyp with tubular adenoma and one fragment of focal high grade dysplasia.   . COLONOSCOPY  08/28/2012   [id number] proctitis. Colonic diverticulosis/Ulcerations at the surgical anastomosis  path: ulcerated colonic mucosa with prolapse changes, tubular adenoma.   . COLONOSCOPY N/A 08/02/2013   RMR: Colonic polyps -removed as described above. Colonic diverticulosis. Status post reight hemicolectomy. Radiation proctitis- asymptomatic.   Marland Kitchen COLONOSCOPY N/A 05/09/2015   Procedure: COLONOSCOPY;  Surgeon: Daneil Dolin, MD;  Location: [facility];  Service: Endoscopy;  Laterality: N/A;  1200-moved to [address]5 [address] to notify pt  . COLONOSCOPY WITH PROPOFOL N/A 11/10/2017   Procedure: COLONOSCOPY WITH PROPOFOL;  Surgeon: Daneil Dolin, MD;  Location: [facility];  Service: Endoscopy;  Laterality: N/A;  . CORONARY ANGIOPLASTY  06/17/14   OM1 POBA  . CORONARY STENT PLACEMENT  01/2011   2.5x64mm Xience drug-eluting stent in ramus intermedius/OMI vessel, 2.5x44mm Promus Element stent in mid LAD artery  . ESOPHAGOGASTRODUODENOSCOPY  10/10/2008   Dr. Tilda Burrow hiatal hernia, normal esophagus, normal stomach  . ESOPHAGOGASTRODUODENOSCOPY  02/12/2004   Dr. Dudley Major- normal   .  ESOPHAGOGASTRODUODENOSCOPY N/A 05/09/2015   Procedure: ESOPHAGOGASTRODUODENOSCOPY (EGD);  Surgeon: Daneil Dolin, MD;  [facility];  Service: Endoscopy;  Laterality: N/A;  . ESOPHAGOGASTRODUODENOSCOPY (EGD) WITH PROPOFOL N/A 11/10/2017   Procedure: ESOPHAGOGASTRODUODENOSCOPY (EGD) WITH PROPOFOL;  Surgeon: Daneil Dolin, MD;  [facility]: [facility];  Service: Endoscopy;  Laterality: N/A;  9:45am  . GIVENS CAPSULE STUDY N/A 06/03/2015   Procedure: GIVENS CAPSULE STUDY;  Surgeon: Daneil Dolin, MD;  [facility]: [facility];  Service: Endoscopy;  Laterality: N/A;  0700  . HOT HEMOSTASIS  11/10/2017   Procedure: HOT HEMOSTASIS (ARGON PLASMA COAGULATION/BICAP);  Surgeon: Daneil Dolin, MD;  Location: [facility];  Service: Endoscopy;;  rectum  . KNEE SURGERY     left knee   . LEFT HEART CATHETERIZATION WITH CORONARY ANGIOGRAM N/A 06/17/2014   Procedure: LEFT HEART CATHETERIZATION WITH CORONARY ANGIOGRAM;  Surgeon: Burnell Blanks, MD;  Location: [facility] CATH LAB;  Service: Cardiovascular;  Laterality: N/A;  . PARTIAL COLECTOMY  02/29/2012   Procedure: PARTIAL COLECTOMY;  Surgeon: Adin Hector, MD;  Location: [facility];  Service: General;  Laterality: Right;  . POLYPECTOMY  11/10/2017   Procedure: POLYPECTOMY;  Surgeon: Daneil Dolin, MD;  Location: [facility];  Service: Endoscopy;;  gastric colon  . PROSTATECTOMY    . TONSILLECTOMY    . TRANSURETHRAL RESECTION OF PROSTATE    . VASECTOMY      There were no vitals filed for this visit.  Subjective Assessment - 04/20/19 1302    Subjective  Pt reports now 4 toes are black from dropping the weight on them this past weekend, but no pain and he is monitoring. Pt reports he has his AFO appointment on Monday (8/24).    Limitations  Walking;House hold activities    How long can you sit comfortably?  no issues    How long can you stand comfortably?  no issues; back limits    How long can you walk comfortably?  no issues when uses  walking stick/SPC in community/outside    Diagnostic tests  none    Patient Stated Goals  regain balance    Currently in Pain?  No/denies           [facility] Adult PT Treatment/Exercise - 04/20/19 0001      Knee/Hip Exercises: Standing   Heel Raises  15 reps    Heel Raises Limitations  15 toe raises    Hip Abduction  Both;10 reps    Abduction Limitations  GTB above knees    Hip Extension  Both;10 reps    Extension Limitations  GTB above knees    Lateral Step Up  Both;10 reps;Step Height: 6"      Knee/Hip Exercises: Seated   Long Arc Quad  Both;2 sets;15 reps    Long Arc Quad Weight  5 lbs.    Sit to [employer]  10 reps  Balance Exercises - 04/20/19 1315      Balance Exercises: Standing   Tandem Stance  Eyes open;Foam/compliant surface;Intermittent upper extremity support;4 reps;30 secs   3-5 sec increments without UE support   SLS  Eyes open;Solid surface;2 reps;10 secs   R: 1 sec or <, L: <1 sec   Gait with Head Turns  Forward   head turns up/down, R/L, 50 X2RT   Step Over Hurdles / Cones  4 6" hurdles on blue line, x2RT    Cone Rotation Limitations  flat foot taps to 6" step, single UE support    Marching Limitations  amb, pouring ball back and forth between cups, x2RT blue line; amb, stacking/unstacking cones in hands, x2RT blue line    Other Standing Exercises  partial tandem, paloff press with 5#, x10 reps with each leg back; partial tandem, BUE flexion with 5#, 2x10 reps each leg back; feet together, BUE extended with 5#, turning R and L, x10 reps        PT Education - 04/20/19 1302    Education Details  Updated HEP, exercise technique    Person(s) Educated  Patient    Methods  Explanation;Demonstration;Handout    Comprehension  Verbalized understanding;Returned demonstration       PT Short Term Goals - 04/18/19 1408      PT SHORT TERM GOAL #1   Title  Pt will be independent with HEP, update as needed and perform consistently.    Time  2    Period   Weeks    Status  On-going    Target Date  04/20/19      PT SHORT TERM GOAL #2   Title  Pt will improve ankle muscle strength by 1/2 grade to improve ability to perform funcitonal activities and reduce risk for falls.    Time  2    Period  Weeks    Status  On-going      PT SHORT TERM GOAL #3   Title  Pt will maintain SLS for 15 sec to demo improved functional strength and reduce risk for falls.    Time  2    Period  Weeks    Status  On-going        PT Long Term Goals - 04/18/19 1408      PT LONG TERM GOAL #1   Title  Pt will improve muscle strength by 1 grade to improve ability to perform funcitonal activities and reduce risk for falls.    Time  4    Period  Weeks    Status  On-going      PT LONG TERM GOAL #2   Title  Pt will improve FGA score to >19 to reduce risk for falls and improve safety with ambulation around the community.    Time  4    Period  Weeks    Status  On-going      PT LONG TERM GOAL #3   Title  Pt will maintain SLS for 30 sec to demo improved functional strength and reduce risk for falls.    Time  4    Period  Weeks    Status  On-going      PT LONG TERM GOAL #4   Title  Pt will perform 5x STS in 14.8 sec or < to indicate improve funcitonal strength and ability to move around home safely.    Time  4    Period  Weeks    Status  On-going  Plan - 04/20/19 1358    Clinical Impression Statement  Continued with pt's established POC. Pt able to perform flat foot taps to 6" box with foot clearance 75% of the time and toe not dorsiflexing enough 25% of the time. Pt also with improved ability to maintain single limb stance without placing weight into tapping foot, but requires single UE support to maintain balance. Pt with partial tandem stance and BUE flexion exercise causing balance challenge, but pt able to right trunk and protective step x2 reps to prevent loss of balance laterally. Pt demonstrates good balance, able to ambulate straight line  gait without veering, protective steps, or loss of balance while pouring ball back and forth between cups and stacking cones back and forth in hands. Pt with decreased balance with ambulation performing up/down head turns causing 1 loss of balance to R requiring pt to reach out for wall and therapist assist to prevent fall. Added weighted LAQ for quad strengthening to reduce bil chronic knee pain. Pt denies pain throughout session. Continue to progress as able.    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  Evolving/Moderate complexity    Rehab Potential  Good    PT Frequency  2x / week    PT Duration  4 weeks    PT Treatment/Interventions  ADLs/Self Care Home Management;Aquatic Therapy;Biofeedback;Cryotherapy;Electrical Stimulation;Iontophoresis 4mg /ml Dexamethasone;Moist Heat;Ultrasound;DME Instruction;Gait training;Stair training;Functional mobility training;Therapeutic activities;Therapeutic exercise;Balance training;Neuromuscular re-education;Patient/family education;Orthotic Fit/Training;Manual techniques;Passive range of motion;Dry needling;Taping;Joint Manipulations    PT Next Visit Plan  Progress quad and hip strengthening. Continue static and dynamic balance exercises. Gait training with and without SPC.    PT Home Exercise Plan  Eval: seated heel and toe raises; 8/11: STS, SLS and tandem stance; 8/12: GTB and dorsiflexion printout; 8/21: LAQ, STS, calf raises standing, tandem stance at counter    Consulted and Agree with Plan of Care  Patient       Patient will benefit from skilled therapeutic intervention in order to improve the following deficits and impairments:  Abnormal gait, Decreased balance,  Decreased coordination, Decreased range of motion, Decreased strength, Difficulty walking, Impaired sensation  Visit Diagnosis: Unsteadiness on feet  Other abnormalities of gait and mobility  Muscle weakness (generalized)     Problem List Patient Active Problem List   Diagnosis Date Noted  . Bilateral foot-drop 04/02/2019  . Decreased appetite 08/18/2017  . Loss of weight 08/18/2017  . Dark stools 08/18/2017  . Morbid obesity ([place]) 05/02/2017  . Vitamin D deficiency 11/26/2015  . CLL (chronic lymphocytic leukemia) ([place]) 07/21/2015  . Mucosal abnormality of esophagus   . Mucosal abnormality of stomach   . Multiple gastric polyps   . History of colon cancer   . Diverticulosis of colon without hemorrhage   . Family history of prostate cancer   . Family history of colon cancer   . Family history of stomach cancer   . Benign prostatic hyperplasia with urinary obstruction 09/12/2014  . H/O prostate cancer 08/20/2014  . Radiation proctitis 08/19/2014  . H/O agent [place] exposure 07/01/2014  . Unstable angina ([place]) 06/16/2014  . Shingles outbreak, right anterior thoracic 01/28/2014  . Gait abnormality 12/18/2013  . Viral gastroenteritis 11/26/2013  . Dyslipidemia 02/12/2013  . Peripheral neuropathy 10/14/2012  . CAD (coronary artery disease) 10/14/2012  . HTN (  hypertension) 10/14/2012  . Hx of adenomatous colonic polyps 07/26/2012  . Folic acid deficiency [id number]  . Iron deficiency 05/02/2012  . Cancer of ascending colon ([place]) 03/29/2012  . GERD 03/09/2010  . PREMATURE VENTRICULAR CONTRACTIONS 04/30/2009    Talbot Grumbling PT, DPT 04/20/19, 2:01 PM [place] [address] [place], [place], [address] Phone: [phone]   Fax:  [phone]  Name: Iliyan Mould. MRN: [id number] Date of Birth: [birth year]

## 2019-04-23 ENCOUNTER — Encounter (HOSPITAL_COMMUNITY): Payer: Self-pay

## 2019-04-23 ENCOUNTER — Other Ambulatory Visit: Payer: Self-pay

## 2019-04-23 ENCOUNTER — Ambulatory Visit (HOSPITAL_COMMUNITY): Payer: Medicare Other

## 2019-04-23 DIAGNOSIS — R2681 Unsteadiness on feet: Secondary | ICD-10-CM

## 2019-04-23 DIAGNOSIS — M6281 Muscle weakness (generalized): Secondary | ICD-10-CM

## 2019-04-23 DIAGNOSIS — R2689 Other abnormalities of gait and mobility: Secondary | ICD-10-CM

## 2019-04-23 NOTE — Therapy (Signed)
[place], [place], [address] Phone: [phone]   Fax:  [phone]  Physical Therapy Treatment  Patient Details  Name: David Pugh. MRN: [id number] Date of Birth: 08-Jun-1935 Referring Provider (PT): Kathrynn Ducking, MD   Encounter Date: 04/23/2019  PT End of Session - 04/23/19 1304    Visit Number  6    Number of Visits  8    Date for PT Re-Evaluation  05/04/19    Authorization Type  [employer]; Secondary: [employer] for life    Authorization Time Period  04/06/19 to 05/04/19    Authorization - Visit Number  6    Authorization - Number of Visits  10    PT Start Time  [id number]    Equipment Utilized During Treatment  Gait belt    Activity Tolerance  Patient tolerated treatment well    Behavior During Therapy  [facility] for tasks assessed/performed       Past Medical History:  Diagnosis Date  . Abnormality of gait 12/18/2013  . Arthritis   . Bilateral foot-drop 04/02/2019  . Biliary dyskinesia   . CAD (coronary artery disease) 2012   a. DES x2 in ramus/OM and mid LAD in 2012  b. balloon angioplaty of OM 2015  . CLL (chronic lymphocytic leukemia) ([place])   . Colon cancer [facility]) 2013   Right hemicolectomy, did not tolerate chemotherapy  . Colon polyps    tubular adenomas  . Diverticula of colon 2010  . Essential hypertension   . Exposure to [employer]   . Folic acid deficiency   . Gastroparesis 2014  . GERD (gastroesophageal reflux disease)   . Glaucoma   . Heart palpitations 2014  . Hiatal hernia 2010  . History of anemia as a child   . History of kidney stones   . Iron deficiency 2013  . Myocardial infarction ([place]) 2012  . Peripheral neuropathy   . Prostate cancer [facility]) 2010   Radiation, surgery  . PTSD (post-traumatic stress disorder)   . Radiation proctitis   . Rosacea     Past Surgical History:  Procedure Laterality Date  . CARDIAC CATHETERIZATION  02/13/2011   [facility], [facility] cardiology  .  CATARACT EXTRACTION     2011  . CATARACT EXTRACTION W/PHACO Right 08/06/2013   Procedure: CATARACT EXTRACTION PHACO AND INTRAOCULAR LENS PLACEMENT (IOC);  Surgeon: Tonny Branch, MD;  Location: [facility] ORS;  Service: Ophthalmology;  Laterality: Right;  CDE:20.96  . CHOLECYSTECTOMY    . COLONOSCOPY  02/12/2004   Dr. Gala Romney- L side diverticula, inflammatory  colon polyps  . COLONOSCOPY  01/19/2012   RMR: Prominent changes involving the rectal mucosa consistent with radiation-induced proctitis. Multiple colonic  polyps removed as described above. Sigmoid Diverticulosis/ 1.5 x 2 cm relatively flat ulcerated lesion in cecum/path showed adenocarcinoma of the colon. descending colon polyp with tubular adenoma and one fragment of focal high grade dysplasia.   . COLONOSCOPY  08/28/2012   [id number] proctitis. Colonic diverticulosis/Ulcerations at the surgical anastomosis  path: ulcerated colonic mucosa with prolapse changes, tubular adenoma.   . COLONOSCOPY N/A 08/02/2013   RMR: Colonic polyps -removed as described above. Colonic diverticulosis. Status post reight hemicolectomy. Radiation proctitis- asymptomatic.   Marland Kitchen COLONOSCOPY N/A 05/09/2015   Procedure: COLONOSCOPY;  Surgeon: Daneil Dolin, MD;  Location: [facility];  Service: Endoscopy;  Laterality: N/A;  1200-moved to [address] to notify pt  . COLONOSCOPY WITH PROPOFOL N/A 11/10/2017   Procedure: COLONOSCOPY  WITH PROPOFOL;  Surgeon: Daneil Dolin, MD;  Location: [facility];  Service: Endoscopy;  Laterality: N/A;  . CORONARY ANGIOPLASTY  06/17/14   OM1 POBA  . CORONARY STENT PLACEMENT  01/2011   2.5x44mm Xience drug-eluting stent in ramus intermedius/OMI vessel, 2.5x63mm Promus Element stent in mid LAD artery  . ESOPHAGOGASTRODUODENOSCOPY  10/10/2008   Dr. Tilda Burrow hiatal hernia, normal esophagus, normal stomach  . ESOPHAGOGASTRODUODENOSCOPY  02/12/2004   Dr. Dudley Major- normal   . ESOPHAGOGASTRODUODENOSCOPY N/A 05/09/2015   Procedure:  ESOPHAGOGASTRODUODENOSCOPY (EGD);  Surgeon: Daneil Dolin, MD;  [facility];  Service: Endoscopy;  Laterality: N/A;  . ESOPHAGOGASTRODUODENOSCOPY (EGD) WITH PROPOFOL N/A 11/10/2017   Procedure: ESOPHAGOGASTRODUODENOSCOPY (EGD) WITH PROPOFOL;  Surgeon: Daneil Dolin, MD;  [facility];  Service: Endoscopy;  Laterality: N/A;  9:45am  . GIVENS CAPSULE STUDY N/A 06/03/2015   Procedure: GIVENS CAPSULE STUDY;  Surgeon: Daneil Dolin, MD;  Location: [facility];  Service: Endoscopy;  Laterality: N/A;  0700  . HOT HEMOSTASIS  11/10/2017   Procedure: HOT HEMOSTASIS (ARGON PLASMA COAGULATION/BICAP);  Surgeon: Daneil Dolin, MD;  Location: [facility];  Service: Endoscopy;;  rectum  . KNEE SURGERY     left knee   . LEFT HEART CATHETERIZATION WITH CORONARY ANGIOGRAM N/A 06/17/2014   Procedure: LEFT HEART CATHETERIZATION WITH CORONARY ANGIOGRAM;  Surgeon: Burnell Blanks, MD;  Location: [facility] CATH LAB;  Service: Cardiovascular;  Laterality: N/A;  . PARTIAL COLECTOMY  02/29/2012   Procedure: PARTIAL COLECTOMY;  Surgeon: Adin Hector, MD;  Location: [facility];  Service: General;  Laterality: Right;  . POLYPECTOMY  11/10/2017   Procedure: POLYPECTOMY;  Surgeon: Daneil Dolin, MD;  Location: [facility];  Service: Endoscopy;;  gastric colon  . PROSTATECTOMY    . TONSILLECTOMY    . TRANSURETHRAL RESECTION OF PROSTATE    . VASECTOMY      There were no vitals filed for this visit.  Subjective Assessment - 04/23/19 1304    Subjective  Pt reports he went to a tank museum over the weekend and walked around for 4 hours causing some swelling, but no pain. Pt noticed his toes occaisonally liked to drag, but his SPC assisted in steadyign him. Pt reports going for bil AFO evaluation tomorrow at [employer] in [place], [place].    Limitations  Walking;House hold activities    How long can you sit comfortably?  no issues    How long can you stand comfortably?  no issues; back limits     How long can you walk comfortably?  no issues when uses walking stick/SPC in community/outside    Diagnostic tests  none    Patient Stated Goals  regain balance    Currently in Pain?  No/denies             [facility] Adult PT Treatment/Exercise - 04/23/19 0001      Knee/Hip Exercises: Standing   Heel Raises  15 reps    Heel Raises Limitations  15 toe raises    Hip Abduction  Both;10 reps    Abduction Limitations  GTB above knees    Hip Extension  Both;10 reps    Extension Limitations  GTB above knees    Wall Squat  10 reps    Wall Squat Limitations  3 sec hold    Other Standing Knee Exercises  weighted walking backward/forward, 40#, 10 reps          Balance Exercises - 04/23/19  1350      Balance Exercises: Standing   Standing Eyes Opened  Narrow base of support (BOS);Foam/compliant surface;3 reps;30 secs    Stepping Strategy  Anterior;10 reps    Gait with Head Turns  Forward   head turns up/down, R/L, 50 x6RT with SPC and without SPC   Other Standing Exercises  partial tandem, paloff press with BTG, x10 reps with each leg back; partial tandem, BUE flexion with 5#, 2x10 reps each leg back        PT Education - 04/23/19 1309    Education Details  Continue HEP, exercise technique    Person(s) Educated  Patient    Methods  Explanation    Comprehension  Verbalized understanding       PT Short Term Goals - 04/18/19 1408      PT SHORT TERM GOAL #1   Title  Pt will be independent with HEP, update as needed and perform consistently.    Time  2    Period  Weeks    Status  On-going    Target Date  04/20/19      PT SHORT TERM GOAL #2   Title  Pt will improve ankle muscle strength by 1/2 grade to improve ability to perform funcitonal activities and reduce risk for falls.    Time  2    Period  Weeks    Status  On-going      PT SHORT TERM GOAL #3   Title  Pt will maintain SLS for 15 sec to demo improved functional strength and reduce risk for falls.    Time  2     Period  Weeks    Status  On-going        PT Long Term Goals - 04/18/19 1408      PT LONG TERM GOAL #1   Title  Pt will improve muscle strength by 1 grade to improve ability to perform funcitonal activities and reduce risk for falls.    Time  4    Period  Weeks    Status  On-going      PT LONG TERM GOAL #2   Title  Pt will improve FGA score to >19 to reduce risk for falls and improve safety with ambulation around the community.    Time  4    Period  Weeks    Status  On-going      PT LONG TERM GOAL #3   Title  Pt will maintain SLS for 30 sec to demo improved functional strength and reduce risk for falls.    Time  4    Period  Weeks    Status  On-going      PT LONG TERM GOAL #4   Title  Pt will perform 5x STS in 14.8 sec or < to indicate improve funcitonal strength and ability to move around home safely.    Time  4    Period  Weeks    Status  On-going            Plan - 04/23/19 1304    Clinical Impression Statement  Added weighted walking with hands holding 40# and ambulating backwards then returning to start with slow, eccentric control, no loss of balance. Added stepping strategy inside // bars targeting anterior step when weight shifts too far anterior to encourage stepping and discourage reaching for objects. Pt able to step appropriately for all reps, varying in control, but able to recover without assistance. Pt performs BUE flexion  in tandem stance with 1 loss of balance with LLE back and able to protective step laterally to prevent fall and recover independently. Pt able to perform static standing with narrow BOS on foam surface for 3x30 seconds with weight shifting from heel to toe and able to upright self without assistance or use of BUE. Pt performs gait with head turns without AD performing with decreased speed and occasional protective stepping laterally. Pt with increased speed and improved balance when performing head turns with SPC. Attempted wall sits for 3 sec  within pain-free range, but increases bil knee pain by final rep requiring seated rest break. Compared SPC in clinic to pt's personal SPC noting 2 inch difference in height, which would improve pt's upright posture at home and decrease leaning onto [facility]; pt reports will find other SPC at home. Continue to progress as able.    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  Evolving/Moderate complexity    Rehab Potential  Good    PT Frequency  2x / week    PT Duration  4 weeks    PT Treatment/Interventions  ADLs/Self Care Home Management;Aquatic Therapy;Biofeedback;Cryotherapy;Electrical Stimulation;Iontophoresis 4mg /ml Dexamethasone;Moist Heat;Ultrasound;DME Instruction;Gait training;Stair training;Functional mobility training;Therapeutic activities;Therapeutic exercise;Balance training;Neuromuscular re-education;Patient/family education;Orthotic Fit/Training;Manual techniques;Passive range of motion;Dry needling;Taping;Joint Manipulations    PT Next Visit Plan  Progress quad and hip strengthening. Progress static and dynamic balance exercises. Initiate gait training with personal SPC for long distance, balance training.    PT Home Exercise Plan  Eval: seated heel and toe raises; 8/11: STS, SLS and tandem stance; 8/12: GTB and dorsiflexion printout; 8/21: LAQ, STS, calf raises standing, tandem stance at counter    Consulted and Agree with Plan of Care  Patient       Patient will benefit from skilled therapeutic intervention in order to improve the following deficits and impairments:  Abnormal gait, Decreased balance, Decreased coordination, Decreased range of motion, Decreased strength, Difficulty walking, Impaired  sensation  Visit Diagnosis: Unsteadiness on feet  Other abnormalities of gait and mobility  Muscle weakness (generalized)     Problem List Patient Active Problem List   Diagnosis Date Noted  . Bilateral foot-drop 04/02/2019  . Decreased appetite 08/18/2017  . Loss of weight 08/18/2017  . Dark stools 08/18/2017  . Morbid obesity ([place]) 05/02/2017  . Vitamin D deficiency 11/26/2015  . CLL (chronic lymphocytic leukemia) ([place]) 07/21/2015  . Mucosal abnormality of esophagus   . Mucosal abnormality of stomach   . Multiple gastric polyps   . History of colon cancer   . Diverticulosis of colon without hemorrhage   . Family history of prostate cancer   . Family history of colon cancer   . Family history of stomach cancer   . Benign prostatic hyperplasia with urinary obstruction 09/12/2014  . H/O prostate cancer 08/20/2014  . Radiation proctitis 08/19/2014  . H/O agent Orange exposure 07/01/2014  . Unstable angina ([place]) 06/16/2014  . Shingles outbreak, right anterior thoracic 01/28/2014  . Gait abnormality 12/18/2013  . Viral gastroenteritis 11/26/2013  . Dyslipidemia 02/12/2013  . Peripheral neuropathy 10/14/2012  . CAD (coronary artery disease) 10/14/2012  . HTN (hypertension) 10/14/2012  . Hx of adenomatous colonic polyps 07/26/2012  . Folic acid deficiency [id number]  . Iron deficiency 05/02/2012  . Cancer of ascending colon ([place]) 03/29/2012  .  GERD 03/09/2010  . PREMATURE VENTRICULAR CONTRACTIONS 04/30/2009      Talbot Grumbling PT, DPT 04/23/19, 2:01 PM [place] [address] [place], [place], [address] Phone: [phone]   Fax:  [phone]  Name: David Pugh. MRN: [id number] Date of Birth: March 01, 1935

## 2019-04-25 ENCOUNTER — Ambulatory Visit (HOSPITAL_COMMUNITY): Payer: Medicare Other

## 2019-04-27 ENCOUNTER — Ambulatory Visit (HOSPITAL_COMMUNITY): Payer: Medicare Other

## 2019-04-27 ENCOUNTER — Encounter (HOSPITAL_COMMUNITY): Payer: Self-pay

## 2019-04-27 ENCOUNTER — Other Ambulatory Visit: Payer: Self-pay

## 2019-04-27 DIAGNOSIS — R2681 Unsteadiness on feet: Secondary | ICD-10-CM | POA: Diagnosis not present

## 2019-04-27 DIAGNOSIS — R2689 Other abnormalities of gait and mobility: Secondary | ICD-10-CM

## 2019-04-27 DIAGNOSIS — M6281 Muscle weakness (generalized): Secondary | ICD-10-CM

## 2019-04-27 NOTE — Therapy (Signed)
[place], [place], [address] Phone: [phone]   Fax:  [phone]  Physical Therapy Treatment  Patient Details  Name: David Pugh. MRN: [id number] Date of Birth: 1934-10-17 Referring Provider (PT): Kathrynn Ducking, MD   Encounter Date: 04/27/2019  PT End of Session - 04/27/19 1108    Visit Number  7    Number of Visits  8    Date for PT Re-Evaluation  05/04/19    Authorization Type  [employer]; Secondary: [employer] for life    Authorization Time Period  04/06/19 to 05/04/19    Authorization - Visit Number  7    Authorization - Number of Visits  10    PT Start Time  1110    PT Stop Time  1155    PT Time Calculation (min)  45 min    Equipment Utilized During Treatment  Gait belt    Activity Tolerance  Patient tolerated treatment well    Behavior During Therapy  WFL for tasks assessed/performed       Past Medical History:  Diagnosis Date  . Abnormality of gait 12/18/2013  . Arthritis   . Bilateral foot-drop 04/02/2019  . Biliary dyskinesia   . CAD (coronary artery disease) 2012   a. DES x2 in ramus/OM and mid LAD in 2012  b. balloon angioplaty of OM 2015  . CLL (chronic lymphocytic leukemia) ([place])   . Colon cancer [facility]) 2013   Right hemicolectomy, did not tolerate chemotherapy  . Colon polyps    tubular adenomas  . Diverticula of colon 2010  . Essential hypertension   . Exposure to [employer]   . Folic acid deficiency   . Gastroparesis 2014  . GERD (gastroesophageal reflux disease)   . Glaucoma   . Heart palpitations 2014  . Hiatal hernia 2010  . History of anemia as a child   . History of kidney stones   . Iron deficiency 2013  . Myocardial infarction ([place]) 2012  . Peripheral neuropathy   . Prostate cancer [facility]) 2010   Radiation, surgery  . PTSD (post-traumatic stress disorder)   . Radiation proctitis   . Rosacea     Past Surgical History:  Procedure Laterality Date  . CARDIAC  CATHETERIZATION  02/13/2011   [facility] cardiology  . CATARACT EXTRACTION     2011  . CATARACT EXTRACTION W/PHACO Right 08/06/2013   Procedure: CATARACT EXTRACTION PHACO AND INTRAOCULAR LENS PLACEMENT (IOC);  Surgeon: Tonny Branch, MD;  Location: [facility] ORS;  Service: Ophthalmology;  Laterality: Right;  CDE:20.96  . CHOLECYSTECTOMY    . COLONOSCOPY  02/12/2004   Dr. Gala Romney- L side diverticula, inflammatory  colon polyps  . COLONOSCOPY  01/19/2012   RMR: Prominent changes involving the rectal mucosa consistent with radiation-induced proctitis. Multiple colonic  polyps removed as described above. Sigmoid Diverticulosis/ 1.5 x 2 cm relatively flat ulcerated lesion in cecum/path showed adenocarcinoma of the colon. descending colon polyp with tubular adenoma and one fragment of focal high grade dysplasia.   . COLONOSCOPY  08/28/2012   [id number] proctitis. Colonic diverticulosis/Ulcerations at the surgical anastomosis  path: ulcerated colonic mucosa with prolapse changes, tubular adenoma.   . COLONOSCOPY N/A 08/02/2013   RMR: Colonic polyps -removed as described above. Colonic diverticulosis. Status post reight hemicolectomy. Radiation proctitis- asymptomatic.   Marland Kitchen COLONOSCOPY N/A 05/09/2015   Procedure: COLONOSCOPY;  Surgeon: Daneil Dolin, MD;  Location: [facility];  Service: Endoscopy;  Laterality: N/A;  1200-moved to [address] to notify pt  . COLONOSCOPY WITH PROPOFOL N/A 11/10/2017   Procedure: COLONOSCOPY WITH PROPOFOL;  Surgeon: Daneil Dolin, MD;  Location: [facility];  Service: Endoscopy;  Laterality: N/A;  . CORONARY ANGIOPLASTY  06/17/14   OM1 POBA  . CORONARY STENT PLACEMENT  01/2011   2.5x61mm Xience drug-eluting stent in ramus intermedius/OMI vessel, 2.5x33mm Promus Element stent in mid LAD artery  . ESOPHAGOGASTRODUODENOSCOPY  10/10/2008   Dr. Tilda Burrow hiatal hernia, normal esophagus, normal stomach  . ESOPHAGOGASTRODUODENOSCOPY  02/12/2004   Dr. Dudley Major- normal   .  ESOPHAGOGASTRODUODENOSCOPY N/A 05/09/2015   Procedure: ESOPHAGOGASTRODUODENOSCOPY (EGD);  Surgeon: Daneil Dolin, MD;  [facility];  Service: Endoscopy;  Laterality: N/A;  . ESOPHAGOGASTRODUODENOSCOPY (EGD) WITH PROPOFOL N/A 11/10/2017   Procedure: ESOPHAGOGASTRODUODENOSCOPY (EGD) WITH PROPOFOL;  Surgeon: Daneil Dolin, MD;  [facility]: [facility];  Service: Endoscopy;  Laterality: N/A;  9:45am  . GIVENS CAPSULE STUDY N/A 06/03/2015   Procedure: GIVENS CAPSULE STUDY;  Surgeon: Daneil Dolin, MD;  Location: [facility];  Service: Endoscopy;  Laterality: N/A;  0700  . HOT HEMOSTASIS  11/10/2017   Procedure: HOT HEMOSTASIS (ARGON PLASMA COAGULATION/BICAP);  Surgeon: Daneil Dolin, MD;  Location: [facility];  Service: Endoscopy;;  rectum  . KNEE SURGERY     left knee   . LEFT HEART CATHETERIZATION WITH CORONARY ANGIOGRAM N/A 06/17/2014   Procedure: LEFT HEART CATHETERIZATION WITH CORONARY ANGIOGRAM;  Surgeon: Burnell Blanks, MD;  Location: [facility] CATH LAB;  Service: Cardiovascular;  Laterality: N/A;  . PARTIAL COLECTOMY  02/29/2012   Procedure: PARTIAL COLECTOMY;  Surgeon: Adin Hector, MD;  Location: [facility];  Service: General;  Laterality: Right;  . POLYPECTOMY  11/10/2017   Procedure: POLYPECTOMY;  Surgeon: Daneil Dolin, MD;  Location: [facility];  Service: Endoscopy;;  gastric colon  . PROSTATECTOMY    . TONSILLECTOMY    . TRANSURETHRAL RESECTION OF PROSTATE    . VASECTOMY      There were no vitals filed for this visit.  Subjective Assessment - 04/27/19 1107    Subjective  Pt reports bil AFOs are being made, minimum of 2 weeks to get in, he will be able to walk with them in his shoes.    Limitations  Walking;House hold activities    How long can you sit comfortably?  no issues    How long can you stand comfortably?  no issues; back limits    How long can you walk comfortably?  no issues when uses walking stick/SPC in community/outside    Diagnostic tests   none    Patient Stated Goals  regain balance    Currently in Pain?  No/denies           [facility] Adult PT Treatment/Exercise - 04/27/19 0001      Knee/Hip Exercises: Stretches   Gastroc Stretch  Both;3 reps;30 seconds    Gastroc Stretch Limitations  slant baord      Knee/Hip Exercises: Standing   Heel Raises  20 reps    Heel Raises Limitations  20 toe raises seated    Lateral Step Up  Both;10 reps;Hand Hold: 1;Step Height: 6"    Forward Step Up  Both;10 reps;Hand Hold: 0;Step Height: 6"    Other Standing Knee Exercises  weighted walking backward/forward, 40#, 10 reps; weight walking laterally, 10#, x5 reps each direction          Balance Exercises - 04/27/19 1129  Balance Exercises: Standing   Gait with Head Turns  Forward   head turns up/down, R/L, 50 x6RT without SPC   Tandem Gait  Forward;Foam/compliant surface;4 reps   5 beams in hallway, single UE on wall   Sidestepping  Foam/compliant support;4 reps   //bars, x2RT, intermittent UE support   Other Standing Exercises  Narrow BOS, foam surface, paloff press and BUE flexion with 5# bar, x10 reps; tandem stance, BUE flexion, foam surface, x10 reps each LE back        PT Education - 04/27/19 1116    Education Details  Continue HEP, exercise technique, benefit of AFOs    Person(s) Educated  Patient    Methods  Explanation    Comprehension  Verbalized understanding       PT Short Term Goals - 04/18/19 1408      PT SHORT TERM GOAL #1   Title  Pt will be independent with HEP, update as needed and perform consistently.    Time  2    Period  Weeks    Status  On-going    Target Date  04/20/19      PT SHORT TERM GOAL #2   Title  Pt will improve ankle muscle strength by 1/2 grade to improve ability to perform funcitonal activities and reduce risk for falls.    Time  2    Period  Weeks    Status  On-going      PT SHORT TERM GOAL #3   Title  Pt will maintain SLS for 15 sec to demo improved functional strength  and reduce risk for falls.    Time  2    Period  Weeks    Status  On-going        PT Long Term Goals - 04/18/19 1408      PT LONG TERM GOAL #1   Title  Pt will improve muscle strength by 1 grade to improve ability to perform funcitonal activities and reduce risk for falls.    Time  4    Period  Weeks    Status  On-going      PT LONG TERM GOAL #2   Title  Pt will improve FGA score to >19 to reduce risk for falls and improve safety with ambulation around the community.    Time  4    Period  Weeks    Status  On-going      PT LONG TERM GOAL #3   Title  Pt will maintain SLS for 30 sec to demo improved functional strength and reduce risk for falls.    Time  4    Period  Weeks    Status  On-going      PT LONG TERM GOAL #4   Title  Pt will perform 5x STS in 14.8 sec or < to indicate improve funcitonal strength and ability to move around home safely.    Time  4    Period  Weeks    Status  On-going            Plan - 04/27/19 1108    Clinical Impression Statement  Pt arrives motivated and upbeat following AFO appointment at orthotist office. Added foam surfaces for ambulation forward, laterally and standing with BUE movement to challenge protective stepping and righting reactions. Pt able to protective step laterally with losses of balance on foam surface with 1 loss of balance requiring multiple steps laterally and min assist from therapist to regain upright posture.  Added weight walking laterally to challenge hip abduction strength concentrically and eccentrically, as well as challenge balance with external forces. Plan to reassess goals and other objective measures next session. Continue to progress as able.    Personal Factors and Comorbidities  Age;Comorbidity 3+;Fitness;Past/Current Experience;Time since onset of injury/illness/exacerbation    Comorbidities  see above    Examination-Activity Limitations  Locomotion Level;Transfers    Examination-Participation Restrictions   Community Activity;Yard Work    [employer]  Evolving/Moderate complexity    Rehab Potential  Good    PT Frequency  2x / week    PT Duration  4 weeks    PT Treatment/Interventions  ADLs/Self Care Home Management;Aquatic Therapy;Biofeedback;Cryotherapy;Electrical Stimulation;Iontophoresis 4mg /ml Dexamethasone;Moist Heat;Ultrasound;DME Instruction;Gait training;Stair training;Functional mobility training;Therapeutic activities;Therapeutic exercise;Balance training;Neuromuscular re-education;Patient/family education;Orthotic Fit/Training;Manual techniques;Passive range of motion;Dry needling;Taping;Joint Manipulations    PT Next Visit Plan  Reassess next session. Progress BLE strengthening and static/dynamic balance exercises. Initiate gait training with personal SPC for long distance, balance training. f/u with AFOs    PT Home Exercise Plan  Eval: seated heel and toe raises; 8/11: STS, SLS and tandem stance; 8/12: GTB and dorsiflexion printout; 8/21: LAQ, STS, calf raises standing, tandem stance at counter    Consulted and Agree with Plan of Care  Patient       Patient will benefit from skilled therapeutic intervention in order to improve the following deficits and impairments:  Abnormal gait, Decreased balance, Decreased coordination, Decreased range of motion, Decreased strength, Difficulty walking, Impaired sensation  Visit Diagnosis: Unsteadiness on feet  Other abnormalities of gait and mobility  Muscle weakness (generalized)     Problem List Patient Active Problem List   Diagnosis Date Noted  . Bilateral foot-drop 04/02/2019  . Decreased appetite 08/18/2017  . Loss of weight 08/18/2017  . Dark stools 08/18/2017  . Morbid obesity ([place]) 05/02/2017  . Vitamin D deficiency 11/26/2015  . CLL (chronic lymphocytic leukemia) ([place]) 07/21/2015  . Mucosal abnormality of esophagus   . Mucosal abnormality of stomach   . Multiple gastric polyps   . History of colon  cancer   . Diverticulosis of colon without hemorrhage   . Family history of prostate cancer   . Family history of colon cancer   . Family history of stomach cancer   . Benign prostatic hyperplasia with urinary obstruction 09/12/2014  . H/O prostate cancer 08/20/2014  . Radiation proctitis 08/19/2014  . H/O agent Orange exposure 07/01/2014  . Unstable angina ([place]) 06/16/2014  . Shingles outbreak, right anterior thoracic 01/28/2014  . Gait abnormality 12/18/2013  . Viral gastroenteritis 11/26/2013  . Dyslipidemia 02/12/2013  . Peripheral neuropathy 10/14/2012  . CAD (coronary artery disease) 10/14/2012  . HTN (hypertension) 10/14/2012  . Hx of adenomatous colonic polyps 07/26/2012  . Folic acid deficiency [id number]  . Iron deficiency 05/02/2012  . Cancer of ascending colon ([place]) 03/29/2012  . GERD 03/09/2010  . PREMATURE VENTRICULAR CONTRACTIONS 04/30/2009      Talbot Grumbling PT, DPT 04/27/19, 12:22 PM [place] [address] [place], [place], [address] Phone: [phone]   Fax:  [phone]  Name: David Pugh. MRN: [id number] Date of Birth: 02-07-1935

## 2019-04-30 ENCOUNTER — Ambulatory Visit (HOSPITAL_COMMUNITY): Payer: Medicare Other

## 2019-05-01 ENCOUNTER — Ambulatory Visit (HOSPITAL_COMMUNITY): Payer: Medicare Other

## 2019-05-01 ENCOUNTER — Ambulatory Visit (HOSPITAL_COMMUNITY): Payer: Medicare Other | Attending: Neurology

## 2019-05-01 ENCOUNTER — Other Ambulatory Visit: Payer: Self-pay

## 2019-05-01 ENCOUNTER — Encounter (HOSPITAL_COMMUNITY): Payer: Self-pay

## 2019-05-01 DIAGNOSIS — R2689 Other abnormalities of gait and mobility: Secondary | ICD-10-CM | POA: Diagnosis present

## 2019-05-01 DIAGNOSIS — M6281 Muscle weakness (generalized): Secondary | ICD-10-CM | POA: Diagnosis present

## 2019-05-01 DIAGNOSIS — R2681 Unsteadiness on feet: Secondary | ICD-10-CM | POA: Insufficient documentation

## 2019-05-01 NOTE — Therapy (Addendum)
[place], [place], [address] Phone: [phone]   Fax:  [phone]  Progress Note Reporting Period 04/06/19 to 05/01/19  See note below for Objective Data and Assessment of Progress/Goals.        Physical Therapy Treatment  Patient Details  Name: David Pugh. MRN: [id number] Date of Birth: Mar 02, 1935 Referring Provider (PT): Kathrynn Ducking, MD   Encounter Date: 05/01/2019  PT End of Session - 05/01/19 1120    Visit Number  8    Number of Visits  14    Date for PT Re-Evaluation  05/04/19    Authorization Type  [employer]; Secondary: [employer] for life    Authorization Time Period  04/06/19 to 05/04/19; NEW: 05/01/19 to 05/25/19    Authorization - Visit Number  8   Reassessment compltted 05/01/19 visit 8   Authorization - Number of Visits  10    PT Start Time  1115    PT Stop Time  1155    PT Time Calculation (min)  40 min    Equipment Utilized During Treatment  Gait belt    Activity Tolerance  Patient tolerated treatment well    Behavior During Therapy  WFL for tasks assessed/performed       Past Medical History:  Diagnosis Date  . Abnormality of gait 12/18/2013  . Arthritis   . Bilateral foot-drop 04/02/2019  . Biliary dyskinesia   . CAD (coronary artery disease) 2012   a. DES x2 in ramus/OM and mid LAD in 2012  b. balloon angioplaty of OM 2015  . CLL (chronic lymphocytic leukemia) ([place])   . Colon cancer [facility]) 2013   Right hemicolectomy, did not tolerate chemotherapy  . Colon polyps    tubular adenomas  . Diverticula of colon 2010  . Essential hypertension   . Exposure to [employer]   . Folic acid deficiency   . Gastroparesis 2014  . GERD (gastroesophageal reflux disease)   . Glaucoma   . Heart palpitations 2014  . Hiatal hernia 2010  . History of anemia as a child   . History of kidney stones   . Iron deficiency 2013  . Myocardial infarction ([place]) 2012  . Peripheral neuropathy   .  Prostate cancer [facility]) 2010   Radiation, surgery  . PTSD (post-traumatic stress disorder)   . Radiation proctitis   . Rosacea     Past Surgical History:  Procedure Laterality Date  . CARDIAC CATHETERIZATION  02/13/2011   [facility] cardiology  . CATARACT EXTRACTION     2011  . CATARACT EXTRACTION W/PHACO Right 08/06/2013   Procedure: CATARACT EXTRACTION PHACO AND INTRAOCULAR LENS PLACEMENT (IOC);  Surgeon: Tonny Branch, MD;  Location: [facility];  Service: Ophthalmology;  Laterality: Right;  CDE:20.96  . CHOLECYSTECTOMY    . COLONOSCOPY  02/12/2004   Dr. Gala Romney- L side diverticula, inflammatory  colon polyps  . COLONOSCOPY  01/19/2012   RMR: Prominent changes involving the rectal mucosa consistent with radiation-induced proctitis. Multiple colonic  polyps removed as described above. Sigmoid Diverticulosis/ 1.5 x 2 cm relatively flat ulcerated lesion in cecum/path showed adenocarcinoma of the colon. descending colon polyp with tubular adenoma and one fragment of focal high grade dysplasia.   . COLONOSCOPY  08/28/2012   WPY:[id number] proctitis. Colonic diverticulosis/Ulcerations at the surgical anastomosis  path: ulcerated colonic mucosa with prolapse changes, tubular adenoma.   . COLONOSCOPY N/A 08/02/2013   RMR: Colonic polyps -removed as described above. Colonic diverticulosis.  Status post reight hemicolectomy. Radiation proctitis- asymptomatic.   Marland Kitchen COLONOSCOPY N/A 05/09/2015   Procedure: COLONOSCOPY;  Surgeon: Daneil Dolin, MD;  Location[facility];  Service: Endoscopy;  Laterality: N/A;  1200-moved to 1145 [place] to notify pt  . COLONOSCOPY WITH PROPOFOL N/A 11/10/2017   Procedure: COLONOSCOPY WITH PROPOFOL;  Surgeon: Daneil Dolin, MD;  [facility];  Service: Endoscopy;  Laterality: N/A;  . CORONARY ANGIOPLASTY  06/17/14   OM1 POBA  . CORONARY STENT PLACEMENT  01/2011   2.5x37m Xience drug-eluting stent in ramus intermedius/OMI vessel, 2.5x638mPromus Element stent in  mid LAD artery  . ESOPHAGOGASTRODUODENOSCOPY  10/10/2008   Dr. RoTilda Burrowiatal hernia, normal esophagus, normal stomach  . ESOPHAGOGASTRODUODENOSCOPY  02/12/2004   Dr. RoDudley Majornormal   . ESOPHAGOGASTRODUODENOSCOPY N/A 05/09/2015   Procedure: ESOPHAGOGASTRODUODENOSCOPY (EGD);  Surgeon: RoDaneil DolinMD;  [facility];  Service: Endoscopy;  Laterality: N/A;  . ESOPHAGOGASTRODUODENOSCOPY (EGD) WITH PROPOFOL N/A 11/10/2017   Procedure: ESOPHAGOGASTRODUODENOSCOPY (EGD) WITH PROPOFOL;  Surgeon: RoDaneil DolinMD;  [facility];  Service: Endoscopy;  Laterality: N/A;  9:45am  . GIVENS CAPSULE STUDY N/A 06/03/2015   Procedure: GIVENS CAPSULE STUDY;  Surgeon: RoDaneil DolinMD;  [facility];  Service: Endoscopy;  Laterality: N/A;  0700  . HOT HEMOSTASIS  11/10/2017   Procedure: HOT HEMOSTASIS (ARGON PLASMA COAGULATION/BICAP);  Surgeon: RoDaneil DolinMD;  [facility];  Service: Endoscopy;;  rectum  . KNEE SURGERY     left knee   . LEFT HEART CATHETERIZATION WITH CORONARY ANGIOGRAM N/A 06/17/2014   Procedure: LEFT HEART CATHETERIZATION WITH CORONARY ANGIOGRAM;  Surgeon: ChBurnell BlanksMD;  [facility];  Service: Cardiovascular;  Laterality: N/A;  . PARTIAL COLECTOMY  02/29/2012   Procedure: PARTIAL COLECTOMY;  Surgeon: HaAdin HectorMD;  Location: [facility];  Service: General;  Laterality: Right;  . POLYPECTOMY  11/10/2017   Procedure: POLYPECTOMY;  Surgeon: RoDaneil DolinMD;  [facility];  Service: Endoscopy;;  gastric colon  . PROSTATECTOMY    . TONSILLECTOMY    . TRANSURETHRAL RESECTION OF PROSTATE    . VASECTOMY      There were no vitals filed for this visit.  Subjective Assessment - 05/01/19 1119    Subjective  Pt reports balance has improved since starting therapy. Pt reports no falls since starting therapy.    Limitations  Walking;House hold activities    How long can you sit comfortably?  no issues    How  long can you stand comfortably?  no issues; back limits    How long can you walk comfortably?  no issues when uses walking stick/SPC in community/outside    Diagnostic tests  none    Patient Stated Goals  regain balance    Currently in Pain?  No/denies                   Balance Exercises - 05/01/19 1153      Balance Exercises: Standing   Marching Limitations  BLE, // bars, x10 reps each with 1 sec hold, 3 sec holds        PT Education - 05/01/19 1120    Education Details  Reassessment findings, exercise technique, continue HEP    Person(s) Educated  Patient    Methods  Explanation    Comprehension  Verbalized understanding       PT Short Term Goals - 05/01/19 1121  PT SHORT TERM GOAL #1   Title  Pt will be independent with HEP, update as needed and perform consistently.    Baseline  9/1: pt reports compliance without difficulty    Time  2    Period  Weeks    Status  Achieved    Target Date  04/20/19      PT SHORT TERM GOAL #2   Title  Pt will improve ankle muscle strength by 1/2 grade to improve ability to perform funcitonal activities and reduce risk for falls.    Baseline  9/1: see MMT    Time  2    Period  Weeks    Status  On-going      PT SHORT TERM GOAL #3   Title  Pt will maintain SLS for 15 sec to demo improved functional strength and reduce risk for falls.    Baseline  9/1: 2 sec or < bil    Time  2    Period  Weeks    Status  On-going        PT Long Term Goals - 05/01/19 1122      PT LONG TERM GOAL #1   Title  Pt will improve muscle strength by 1 grade to improve ability to perform funcitonal activities and reduce risk for falls.    Baseline  9/1: see MMT    Time  4    Period  Weeks    Status  On-going      PT LONG TERM GOAL #2   Title  Pt will improve FGA score to >19 to reduce risk for falls and improve safety with ambulation around the community.    Baseline  9/1: 15    Time  4    Period  Weeks    Status  On-going      PT  LONG TERM GOAL #3   Title  Pt will maintain SLS for 30 sec to demo improved functional strength and reduce risk for falls.    Baseline  9/1: 2 sec or < bil    Time  4    Period  Weeks    Status  On-going      PT LONG TERM GOAL #4   Title  Pt will perform 5x STS in 14.8 sec or < to indicate improve funcitonal strength and ability to move around home safely.    Baseline  9/1: 13 sec    Time  4    Period  Weeks    Status  Achieved            Plan - 05/01/19 1200    Clinical Impression Statement  Pt due for reassessment this date. Pt has met 1 STG and 1 LTG this date with minimal improvement in BLE strength and 5 pt increase in FGA. Pt continues to demonstrate gait abnormalities with occasional toe catching due to decreased bil foot clearance, but able to achieve neutral and past neutral for dorsiflexion per ROM testing. Pt receiving bil AFOs to assist with bil toe clearance and with improvement functionally, pt would continue to benefit from skilled PT interventions for additional 3 weeks to improve gait pattern, balance, strength, and overall mobility to improve QoL.    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  Evolving/Moderate complexity  Rehab Potential  Good    PT Frequency  2x / week    PT Duration  3 weeks    PT Treatment/Interventions  ADLs/Self Care Home Management;Aquatic Therapy;Biofeedback;Cryotherapy;Electrical Stimulation;Iontophoresis 75m/ml Dexamethasone;Moist Heat;Ultrasound;DME Instruction;Gait training;Stair training;Functional mobility training;Therapeutic activities;Therapeutic exercise;Balance training;Neuromuscular re-education;Patient/family education;Orthotic  Fit/Training;Manual techniques;Passive range of motion;Dry needling;Taping;Joint Manipulations    PT Next Visit Plan  Progress BLE strengthening and static/dynamic balance exercises. Gait training with personal SAlexanderfor long distance, balance training. f/u with AFOs    PT Home Exercise Plan  Eval: seated heel and toe raises; 8/11: STS, SLS and tandem stance; 8/12: GTB and dorsiflexion printout; 8/21: LAQ, STS, calf raises standing, tandem stance at counter    Consulted and Agree with Plan of Care  Patient       Patient will benefit from skilled therapeutic intervention in order to improve the following deficits and impairments:  Abnormal gait, Decreased balance, Decreased coordination, Decreased range of motion, Decreased strength, Difficulty walking, Impaired sensation  Visit Diagnosis: Unsteadiness on feet - Plan: PT plan of care cert/re-cert  Other abnormalities of gait and mobility - Plan: PT plan of care cert/re-cert  Muscle weakness (generalized) - Plan: PT plan of care cert/re-cert     Problem List Patient Active Problem List   Diagnosis Date Noted  . Bilateral foot-drop 04/02/2019  . Decreased appetite 08/18/2017  . Loss of weight 08/18/2017  . Dark stools 08/18/2017  . Morbid obesity ([place] 05/02/2017  . Vitamin D deficiency 11/26/2015  . CLL (chronic lymphocytic leukemia) ([place] 07/21/2015  . Mucosal abnormality of esophagus   . Mucosal abnormality of stomach   . Multiple gastric polyps   . History of colon cancer   . Diverticulosis of colon without hemorrhage   . Family history of prostate cancer   . Family history of colon cancer   . Family history of stomach cancer   . Benign prostatic hyperplasia with urinary obstruction 09/12/2014  . H/O prostate cancer 08/20/2014  . Radiation proctitis 08/19/2014  . H/O agent [place] exposure 07/01/2014  . Unstable angina ([place] 06/16/2014  . Shingles outbreak, right anterior thoracic 01/28/2014  . Gait abnormality 12/18/2013  .  Viral gastroenteritis 11/26/2013  . Dyslipidemia 02/12/2013  . Peripheral neuropathy 10/14/2012  . CAD (coronary artery disease) 10/14/2012  . HTN (hypertension) 10/14/2012  . Hx of adenomatous colonic polyps 07/26/2012  . Folic acid deficiency 138/33/3832 . Iron deficiency 05/02/2012  . Cancer of ascending colon ([place] 03/29/2012  . GERD 03/09/2010  . PREMATURE VENTRICULAR CONTRACTIONS 04/30/2009     TTalbot GrumblingPT, DPT 05/01/19, 12:10 PM [id number] [id number]: [phone]  Fax:  [phone] Name: GSkylen Spiering MRN: [id number] of Birth: 503-25-1936

## 2019-05-02 ENCOUNTER — Ambulatory Visit (HOSPITAL_COMMUNITY): Payer: Medicare Other | Admitting: Physical Therapy

## 2019-05-02 ENCOUNTER — Encounter (HOSPITAL_COMMUNITY): Payer: Self-pay | Admitting: Physical Therapy

## 2019-05-02 DIAGNOSIS — R2681 Unsteadiness on feet: Secondary | ICD-10-CM

## 2019-05-02 DIAGNOSIS — M6281 Muscle weakness (generalized): Secondary | ICD-10-CM

## 2019-05-02 DIAGNOSIS — R2689 Other abnormalities of gait and mobility: Secondary | ICD-10-CM

## 2019-05-02 NOTE — Therapy (Signed)
[place], [place], [address] Phone: [phone]   Fax:  [phone]  Physical Therapy Treatment  Patient Details  Name: David Pugh. MRN: [id number] Date of Birth: [birth year] Referring Provider (PT): Kathrynn Ducking, MD   Encounter Date: 05/02/2019  PT End of Session - 05/02/19 1405    Visit Number  9    Number of Visits  14    Date for PT Re-Evaluation  05/04/19    Authorization Type  [employer]; Secondary: [employer] for life    Authorization Time Period  04/06/19 to 05/04/19; NEW: 05/01/19 to 05/25/19    Authorization - Visit Number  1   Reassessment compltted 05/01/19 visit 8   Authorization - Number of Visits  10    PT Start Time  1351    PT Stop Time  1429    PT Time Calculation (min)  38 min    Equipment Utilized During Treatment  Gait belt    Activity Tolerance  Patient tolerated treatment well    Behavior During Therapy  WFL for tasks assessed/performed       Past Medical History:  Diagnosis Date  . Abnormality of gait 12/18/2013  . Arthritis   . Bilateral foot-drop 04/02/2019  . Biliary dyskinesia   . CAD (coronary artery disease) 2012   a. DES x2 in ramus/OM and mid LAD in 2012  b. balloon angioplaty of OM 2015  . CLL (chronic lymphocytic leukemia) ([place])   . Colon cancer [facility]) 2013   Right hemicolectomy, did not tolerate chemotherapy  . Colon polyps    tubular adenomas  . Diverticula of colon 2010  . Essential hypertension   . Exposure to [employer]   . Folic acid deficiency   . Gastroparesis 2014  . GERD (gastroesophageal reflux disease)   . Glaucoma   . Heart palpitations 2014  . Hiatal hernia 2010  . History of anemia as a child   . History of kidney stones   . Iron deficiency 2013  . Myocardial infarction ([place]) 2012  . Peripheral neuropathy   . Prostate cancer [facility]) 2010   Radiation, surgery  . PTSD (post-traumatic stress disorder)   . Radiation proctitis   . Rosacea      Past Surgical History:  Procedure Laterality Date  . CARDIAC CATHETERIZATION  02/13/2011   [facility] cardiology  . CATARACT EXTRACTION     2011  . CATARACT EXTRACTION W/PHACO Right 08/06/2013   Procedure: CATARACT EXTRACTION PHACO AND INTRAOCULAR LENS PLACEMENT (IOC);  Surgeon: Tonny Branch, MD;  Location: [facility];  Service: Ophthalmology;  Laterality: Right;  CDE:20.96  . CHOLECYSTECTOMY    . COLONOSCOPY  02/12/2004   Dr. Gala Romney- L side diverticula, inflammatory  colon polyps  . COLONOSCOPY  01/19/2012   RMR: Prominent changes involving the rectal mucosa consistent with radiation-induced proctitis. Multiple colonic  polyps removed as described above. Sigmoid Diverticulosis/ 1.5 x 2 cm relatively flat ulcerated lesion in cecum/path showed adenocarcinoma of the colon. descending colon polyp with tubular adenoma and one fragment of focal high grade dysplasia.   . COLONOSCOPY  08/28/2012   [id number] proctitis. Colonic diverticulosis/Ulcerations at the surgical anastomosis  path: ulcerated colonic mucosa with prolapse changes, tubular adenoma.   . COLONOSCOPY N/A 08/02/2013   RMR: Colonic polyps -removed as described above. Colonic diverticulosis. Status post reight hemicolectomy. Radiation proctitis- asymptomatic.   Marland Kitchen COLONOSCOPY N/A 05/09/2015   Procedure: COLONOSCOPY;  Surgeon: Daneil Dolin, MD;  Location[facility]  [facility];  Service: Endoscopy;  Laterality: N/A;  1200-moved to [address]5 [address] to notify pt  . COLONOSCOPY WITH PROPOFOL N/A 11/10/2017   Procedure: COLONOSCOPY WITH PROPOFOL;  Surgeon: Daneil Dolin, MD;  Location: [facility];  Service: Endoscopy;  Laterality: N/A;  . CORONARY ANGIOPLASTY  06/17/14   OM1 POBA  . CORONARY STENT PLACEMENT  01/2011   2.5x38mm Xience drug-eluting stent in ramus intermedius/OMI vessel, 2.5x61mm Promus Element stent in mid LAD artery  . ESOPHAGOGASTRODUODENOSCOPY  10/10/2008   Dr. Tilda Burrow hiatal hernia, normal esophagus, normal stomach  .  ESOPHAGOGASTRODUODENOSCOPY  02/12/2004   Dr. Dudley Major- normal   . ESOPHAGOGASTRODUODENOSCOPY N/A 05/09/2015   Procedure: ESOPHAGOGASTRODUODENOSCOPY (EGD);  Surgeon: Daneil Dolin, MD;  [facility];  Service: Endoscopy;  Laterality: N/A;  . ESOPHAGOGASTRODUODENOSCOPY (EGD) WITH PROPOFOL N/A 11/10/2017   Procedure: ESOPHAGOGASTRODUODENOSCOPY (EGD) WITH PROPOFOL;  Surgeon: Daneil Dolin, MD;  [facility];  Service: Endoscopy;  Laterality: N/A;  9:45am  . GIVENS CAPSULE STUDY N/A 06/03/2015   Procedure: GIVENS CAPSULE STUDY;  Surgeon: Daneil Dolin, MD;  [facility];  Service: Endoscopy;  Laterality: N/A;  0700  . HOT HEMOSTASIS  11/10/2017   Procedure: HOT HEMOSTASIS (ARGON PLASMA COAGULATION/BICAP);  Surgeon: Daneil Dolin, MD;  Location[facility];  Service: Endoscopy;;  rectum  . KNEE SURGERY     left knee   . LEFT HEART CATHETERIZATION WITH CORONARY ANGIOGRAM N/A 06/17/2014   Procedure: LEFT HEART CATHETERIZATION WITH CORONARY ANGIOGRAM;  Surgeon: Burnell Blanks, MD;  Location: [facility] CATH LAB;  Service: Cardiovascular;  Laterality: N/A;  . PARTIAL COLECTOMY  02/29/2012   Procedure: PARTIAL COLECTOMY;  Surgeon: Adin Hector, MD;  Location: [facility];  Service: General;  Laterality: Right;  . POLYPECTOMY  11/10/2017   Procedure: POLYPECTOMY;  Surgeon: Daneil Dolin, MD;  [facility];  Service: Endoscopy;;  gastric colon  . PROSTATECTOMY    . TONSILLECTOMY    . TRANSURETHRAL RESECTION OF PROSTATE    . VASECTOMY      There were no vitals filed for this visit.  Subjective Assessment - 05/02/19 1445    Subjective  Patient denied any pain. At end of session asked if there was anything to work on his single leg balance to do at home.    Limitations  Walking;House hold activities    How long can you sit comfortably?  no issues    How long can you stand comfortably?  no issues; back limits    How long can you walk comfortably?  no issues  when uses walking stick/SPC in community/outside    Diagnostic tests  none    Patient Stated Goals  regain balance    Currently in Pain?  No/denies                       [facility] Adult PT Treatment/Exercise - 05/02/19 0001      Knee/Hip Exercises: Stretches   Gastroc Stretch  Both;3 reps;30 seconds      Knee/Hip Exercises: Standing   Heel Raises  20 reps    Heel Raises Limitations  single leg, 10 reps each    Lateral Step Up  Both;1 set;15 reps;Hand Hold: 2;Step Height: 6"    Forward Step Up  Both;15 reps;Hand Hold: 2;Step Height: 6"    Forward Step Up Limitations  Opposite knee drive  for balance and strengthening    Other Standing Knee Exercises  weighted walking backward/forward, 40# 10 reps. Sit to stand with forward unilateral step towards cone x10 min guard.          Balance Exercises - 05/02/19 1437      Balance Exercises: Standing   Gait with Head Turns  Forward   R/L and up/down inside parallel bars x 4 without AD   Tandem Gait  Forward;Foam/compliant surface;4 reps   In // bars. Intermittent UE   Sidestepping  Foam/compliant support;4 reps   in // bars with intermittent UE   Marching Limitations  BLE, // bars, x10 reps each with 1 sec hold, 3 sec holds        PT Education - 05/02/19 1446    Education Details  Discussed purpose and technique of interventions throughout session.    Person(s) Educated  Patient    Methods  Explanation    Comprehension  Verbalized understanding       PT Short Term Goals - 05/01/19 1121      PT SHORT TERM GOAL #1   Title  Pt will be independent with HEP, update as needed and perform consistently.    Baseline  9/1: pt reports compliance without difficulty    Time  2    Period  Weeks    Status  Achieved    Target Date  04/20/19      PT SHORT TERM GOAL #2   Title  Pt will improve ankle muscle strength by 1/2 grade to improve ability to perform funcitonal activities and reduce risk for falls.    Baseline  9/1:  see MMT    Time  2    Period  Weeks    Status  On-going      PT SHORT TERM GOAL #3   Title  Pt will maintain SLS for 15 sec to demo improved functional strength and reduce risk for falls.    Baseline  9/1: 2 sec or < bil    Time  2    Period  Weeks    Status  On-going        PT Long Term Goals - 05/01/19 1122      PT LONG TERM GOAL #1   Title  Pt will improve muscle strength by 1 grade to improve ability to perform funcitonal activities and reduce risk for falls.    Baseline  9/1: see MMT    Time  4    Period  Weeks    Status  On-going      PT LONG TERM GOAL #2   Title  Pt will improve FGA score to >19 to reduce risk for falls and improve safety with ambulation around the community.    Baseline  9/1: 15    Time  4    Period  Weeks    Status  On-going      PT LONG TERM GOAL #3   Title  Pt will maintain SLS for 30 sec to demo improved functional strength and reduce risk for falls.    Baseline  9/1: 2 sec or < bil    Time  4    Period  Weeks    Status  On-going      PT LONG TERM GOAL #4   Title  Pt will perform 5x STS in 14.8 sec or < to indicate improve funcitonal strength and ability to move around home safely.    Baseline  9/1: 13 sec    Time  4    Period  Weeks  Status  Achieved            Plan - 05/02/19 1447    Clinical Impression Statement  This session continued to progress patient as able. This session added forward step ups with opposite knee drive to challenge strengthening and balance. In addition, added sit to stands with unilateral forward stepping towards a cone. At the end of the session patient asked about having more exercises to work on him being able to stand on one leg at home. Discussed that hip strengthening may help to improve patient's single leg balance and plan to put it in the plan to add some hip strengthening exercises next session following trialing them during the session.    Personal Factors and Comorbidities  Age;Comorbidity  3+;Fitness;Past/Current Experience;Time since onset of injury/illness/exacerbation    Comorbidities  see above    Examination-Activity Limitations  Locomotion Level;Transfers    Examination-Participation Restrictions  Community Activity;Yard Work    [employer]  Evolving/Moderate complexity    Rehab Potential  Good    PT Frequency  2x / week    PT Duration  3 weeks    PT Treatment/Interventions  ADLs/Self Care Home Management;Aquatic Therapy;Biofeedback;Cryotherapy;Electrical Stimulation;Iontophoresis 4mg /ml Dexamethasone;Moist Heat;Ultrasound;DME Instruction;Gait training;Stair training;Functional mobility training;Therapeutic activities;Therapeutic exercise;Balance training;Neuromuscular re-education;Patient/family education;Orthotic Fit/Training;Manual techniques;Passive range of motion;Dry needling;Taping;Joint Manipulations    PT Next Visit Plan  Consider adding targeted hip strengthening to work on single leg balance to add to patient's HEP, patient would like to work on single leg balance. Progress BLE strengthening and static/dynamic balance exercises. Gait training with personal [place] for long distance, balance training. f/u with AFOs    PT Home Exercise Plan  Eval: seated heel and toe raises; 8/11: STS, SLS and tandem stance; 8/12: GTB and dorsiflexion printout; 8/21: LAQ, STS, calf raises standing, tandem stance at counter    Consulted and Agree with Plan of Care  Patient       Patient will benefit from skilled therapeutic intervention in order to improve the following deficits and impairments:  Abnormal gait, Decreased balance, Decreased coordination, Decreased range of motion, Decreased strength, Difficulty walking, Impaired sensation  Visit Diagnosis: Unsteadiness on feet  Other abnormalities of gait and mobility  Muscle weakness (generalized)     Problem List Patient Active Problem List   Diagnosis Date Noted  . Bilateral foot-drop 04/02/2019  .  Decreased appetite 08/18/2017  . Loss of weight 08/18/2017  . Dark stools 08/18/2017  . Morbid obesity ([place]) 05/02/2017  . Vitamin D deficiency 11/26/2015  . CLL (chronic lymphocytic leukemia) ([place]) 07/21/2015  . Mucosal abnormality of esophagus   . Mucosal abnormality of stomach   . Multiple gastric polyps   . History of colon cancer   . Diverticulosis of colon without hemorrhage   . Family history of prostate cancer   . Family history of colon cancer   . Family history of stomach cancer   . Benign prostatic hyperplasia with urinary obstruction 09/12/2014  . H/O prostate cancer 08/20/2014  . Radiation proctitis 08/19/2014  . H/O agent Orange exposure 07/01/2014  . Unstable angina ([place]) 06/16/2014  . Shingles outbreak, right anterior thoracic 01/28/2014  . Gait abnormality 12/18/2013  . Viral gastroenteritis 11/26/2013  . Dyslipidemia 02/12/2013  . Peripheral neuropathy 10/14/2012  . CAD (coronary artery disease) 10/14/2012  . HTN (hypertension) 10/14/2012  . Hx of adenomatous colonic polyps 07/26/2012  . Folic acid deficiency [id number]  . Iron deficiency 05/02/2012  . Cancer of ascending colon ([place]) 03/29/2012  . GERD 03/09/2010  .  PREMATURE VENTRICULAR CONTRACTIONS 04/30/2009   Clarene Critchley PT, DPT 2:50 PM, 05/02/19 [place], [place], [address] Phone: [phone]   Fax:  [phone]  Name: David Pugh. MRN: [id number] Date of Birth: 05-31-1935

## 2019-05-04 ENCOUNTER — Ambulatory Visit (HOSPITAL_COMMUNITY): Payer: Medicare Other

## 2019-05-09 ENCOUNTER — Ambulatory Visit (HOSPITAL_COMMUNITY): Payer: Medicare Other

## 2019-05-09 ENCOUNTER — Other Ambulatory Visit: Payer: Self-pay

## 2019-05-09 ENCOUNTER — Encounter (HOSPITAL_COMMUNITY): Payer: Self-pay

## 2019-05-09 DIAGNOSIS — M6281 Muscle weakness (generalized): Secondary | ICD-10-CM

## 2019-05-09 DIAGNOSIS — R2681 Unsteadiness on feet: Secondary | ICD-10-CM

## 2019-05-09 DIAGNOSIS — R2689 Other abnormalities of gait and mobility: Secondary | ICD-10-CM

## 2019-05-09 NOTE — Therapy (Signed)
[place], [place], [address] Phone: [phone]   Fax:  [phone]  Physical Therapy Treatment  Patient Details  Name: David Pugh. MRN: [id number] Date of Birth: [birth year] Referring Provider (PT): Kathrynn Ducking, MD   Encounter Date: 05/09/2019  PT End of Session - 05/09/19 1155    Visit Number  10    Number of Visits  14    Date for PT Re-Evaluation  05/04/19    Authorization Type  [employer]; Secondary: [employer] for life    Authorization Time Period  04/06/19 to 05/04/19; NEW: 05/01/19 to 05/25/19    Authorization - Visit Number  2   Reassessment compltted 05/01/19 visit 8   Authorization - Number of Visits  10    PT Start Time  1115    PT Stop Time  1155    PT Time Calculation (min)  40 min    Equipment Utilized During Treatment  Gait belt    Activity Tolerance  Patient tolerated treatment well    Behavior During Therapy  WFL for tasks assessed/performed       Past Medical History:  Diagnosis Date  . Abnormality of gait 12/18/2013  . Arthritis   . Bilateral foot-drop 04/02/2019  . Biliary dyskinesia   . CAD (coronary artery disease) 2012   a. DES x2 in ramus/OM and mid LAD in 2012  b. balloon angioplaty of OM 2015  . CLL (chronic lymphocytic leukemia) ([place])   . Colon cancer [facility]) 2013   Right hemicolectomy, did not tolerate chemotherapy  . Colon polyps    tubular adenomas  . Diverticula of colon 2010  . Essential hypertension   . Exposure to [employer]   . Folic acid deficiency   . Gastroparesis 2014  . GERD (gastroesophageal reflux disease)   . Glaucoma   . Heart palpitations 2014  . Hiatal hernia 2010  . History of anemia as a child   . History of kidney stones   . Iron deficiency 2013  . Myocardial infarction ([place]) 2012  . Peripheral neuropathy   . Prostate cancer [facility]) 2010   Radiation, surgery  . PTSD (post-traumatic stress disorder)   . Radiation proctitis   . Rosacea      Past Surgical History:  Procedure Laterality Date  . CARDIAC CATHETERIZATION  02/13/2011   [facility] cardiology  . CATARACT EXTRACTION     2011  . CATARACT EXTRACTION W/PHACO Right 08/06/2013   Procedure: CATARACT EXTRACTION PHACO AND INTRAOCULAR LENS PLACEMENT (IOC);  Surgeon: Tonny Branch, MD;  Location: [facility] OR[facility];  Service: Ophthalmology;  Laterality: Right;  CDE:20.96  . CHOLECYSTECTOMY    . COLONOSCOPY  02/12/2004   Dr. Gala Romney- L side diverticula, inflammatory  colon polyps  . COLONOSCOPY  01/19/2012   RMR: Prominent changes involving the rectal mucosa consistent with radiation-induced proctitis. Multiple colonic  polyps removed as described above. Sigmoid Diverticulosis/ 1.5 x 2 cm relatively flat ulcerated lesion in cecum/path showed adenocarcinoma of the colon. descending colon polyp with tubular adenoma and one fragment of focal high grade dysplasia.   . COLONOSCOPY  08/28/2012   [id number] proctitis. Colonic diverticulosis/Ulcerations at the surgical anastomosis  path: ulcerated colonic mucosa with prolapse changes, tubular adenoma.   . COLONOSCOPY N/A 08/02/2013   RMR: Colonic polyps -removed as described above. Colonic diverticulosis. Status post reight hemicolectomy. Radiation proctitis- asymptomatic.   Marland Kitchen COLONOSCOPY N/A 05/09/2015   Procedure: COLONOSCOPY;  Surgeon: Daneil Dolin, MD;  Location:  [facility];  Service: Endoscopy;  Laterality: N/A;  1200-moved to [address]5 [place] to notify pt  . COLONOSCOPY WITH PROPOFOL N/A 11/10/2017   Procedure: COLONOSCOPY WITH PROPOFOL;  Surgeon: Daneil Dolin, MD;  Location: [facility];  Service: Endoscopy;  Laterality: N/A;  . CORONARY ANGIOPLASTY  06/17/14   OM1 POBA  . CORONARY STENT PLACEMENT  01/2011   2.5x11mm Xience drug-eluting stent in ramus intermedius/OMI vessel, 2.5x85mm Promus Element stent in mid LAD artery  . ESOPHAGOGASTRODUODENOSCOPY  10/10/2008   Dr. Tilda Burrow hiatal hernia, normal esophagus, normal stomach  .  ESOPHAGOGASTRODUODENOSCOPY  02/12/2004   Dr. Dudley Major- normal   . ESOPHAGOGASTRODUODENOSCOPY N/A 05/09/2015   Procedure: ESOPHAGOGASTRODUODENOSCOPY (EGD);  Surgeon: Daneil Dolin, MD;  Location[facility];  Service: Endoscopy;  Laterality: N/A;  . ESOPHAGOGASTRODUODENOSCOPY (EGD) WITH PROPOFOL N/A 11/10/2017   Procedure: ESOPHAGOGASTRODUODENOSCOPY (EGD) WITH PROPOFOL;  Surgeon: Daneil Dolin, MD;  [facility]: [facility];  Service: Endoscopy;  Laterality: N/A;  9:45am  . GIVENS CAPSULE STUDY N/A 06/03/2015   Procedure: GIVENS CAPSULE STUDY;  Surgeon: Daneil Dolin, MD;  Location: [facility];  Service: Endoscopy;  Laterality: N/A;  0700  . HOT HEMOSTASIS  11/10/2017   Procedure: HOT HEMOSTASIS (ARGON PLASMA COAGULATION/BICAP);  Surgeon: Daneil Dolin, MD;  Location: [facility];  Service: Endoscopy;;  rectum  . KNEE SURGERY     left knee   . LEFT HEART CATHETERIZATION WITH CORONARY ANGIOGRAM N/A 06/17/2014   Procedure: LEFT HEART CATHETERIZATION WITH CORONARY ANGIOGRAM;  Surgeon: Burnell Blanks, MD;  Location: [facility] CATH LAB;  Service: Cardiovascular;  Laterality: N/A;  . PARTIAL COLECTOMY  02/29/2012   Procedure: PARTIAL COLECTOMY;  Surgeon: Adin Hector, MD;  Location: [facility];  Service: General;  Laterality: Right;  . POLYPECTOMY  11/10/2017   Procedure: POLYPECTOMY;  Surgeon: Daneil Dolin, MD;  Location: [facility];  Service: Endoscopy;;  gastric colon  . PROSTATECTOMY    . TONSILLECTOMY    . TRANSURETHRAL RESECTION OF PROSTATE    . VASECTOMY      There were no vitals filed for this visit.  Subjective Assessment - 05/09/19 1119    Subjective  Pt reports he is doing well, still no word on his AFOs.    Limitations  Walking;House hold activities    How long can you sit comfortably?  no issues    How long can you stand comfortably?  no issues; back limits    How long can you walk comfortably?  no issues when uses walking stick/SPC in community/outside    Diagnostic  tests  none    Patient Stated Goals  regain balance    Currently in Pain?  No/denies         [facility] Adult PT Treatment/Exercise - 05/09/19 0001      Knee/Hip Exercises: Standing   Heel Raises  20 reps    Heel Raises Limitations  single leg, 15 reps    Hip Abduction  Both;10 reps    Abduction Limitations  GTB above knees    Hip Extension  Both;10 reps    Extension Limitations  GTB above knees    Forward Step Up  Both;10 reps;Hand Hold: 1;Step Height: 6"    Forward Step Up Limitations  Opposite knee drive for balance and strengthening    Other Standing Knee Exercises  weighted walking laterally, 30#, x5RT    Other Standing Knee Exercises  sidestepping, GTB around ankles, x1RT blue line  Balance Exercises - 05/09/19 1207      Balance Exercises: Standing   Tandem Gait  Forward;Foam/compliant surface;4 reps   intermittent UE assist on wall, balance beam in hall   Sidestepping  Foam/compliant support;4 reps   intermittent UE assist on wall, balance beam in hall   Marching Limitations  BLE, // bars, x10 reps each with 1 sec hold, 3 sec holds, single UE assist    Other Standing Exercises  vector stance, forward/lateral/back, 3 sec hold, single UE support, x10 reps each LE        PT Education - 05/09/19 1202    Education Details  Exercise technique, continue HEP    Person(s) Educated  Patient    Methods  Explanation    Comprehension  Verbalized understanding       PT Short Term Goals - 05/01/19 1121      PT SHORT TERM GOAL #1   Title  Pt will be independent with HEP, update as needed and perform consistently.    Baseline  9/1: pt reports compliance without difficulty    Time  2    Period  Weeks    Status  Achieved    Target Date  04/20/19      PT SHORT TERM GOAL #2   Title  Pt will improve ankle muscle strength by 1/2 grade to improve ability to perform funcitonal activities and reduce risk for falls.    Baseline  9/1: see MMT    Time  2    Period  Weeks     Status  On-going      PT SHORT TERM GOAL #3   Title  Pt will maintain SLS for 15 sec to demo improved functional strength and reduce risk for falls.    Baseline  9/1: 2 sec or < bil    Time  2    Period  Weeks    Status  On-going        PT Long Term Goals - 05/01/19 1122      PT LONG TERM GOAL #1   Title  Pt will improve muscle strength by 1 grade to improve ability to perform funcitonal activities and reduce risk for falls.    Baseline  9/1: see MMT    Time  4    Period  Weeks    Status  On-going      PT LONG TERM GOAL #2   Title  Pt will improve FGA score to >19 to reduce risk for falls and improve safety with ambulation around the community.    Baseline  9/1: 15    Time  4    Period  Weeks    Status  On-going      PT LONG TERM GOAL #3   Title  Pt will maintain SLS for 30 sec to demo improved functional strength and reduce risk for falls.    Baseline  9/1: 2 sec or < bil    Time  4    Period  Weeks    Status  On-going      PT LONG TERM GOAL #4   Title  Pt will perform 5x STS in 14.8 sec or < to indicate improve funcitonal strength and ability to move around home safely.    Baseline  9/1: 13 sec    Time  4    Period  Weeks    Status  Achieved            Plan - 05/09/19 1203  Clinical Impression Statement  Progressed weighted walking to 30# lateral stepping with significantly increased difficulty. Pt with multiple protective stepping strategies forward lateral and posterior when sidestepping to the L with weight anchored on the R. Pt continues with increased difficulty performing forward and lateral walking on foams surface with 2 near falls requiring mod assist from therapist to prevent fall due to stepping on toes. Pt progressed with step ups and opposite knee drives requiring single UE support this date. Added vector stance, but pt requires since UE support for balance, able to progress from gripped hand hold to flat palm on support surface. Continued  strengthening on bil hips, but continue to be limited due to bil knee pain requiring therapeutic rest breaks to recover. Pt denies pain at EOS; still no word on AFO arrival from [employer]. Continue to progress as able.    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  Evolving/Moderate complexity    Rehab Potential  Good    PT Frequency  2x / week    PT Duration  3 weeks    PT Treatment/Interventions  ADLs/Self Care Home Management;Aquatic Therapy;Biofeedback;Cryotherapy;Electrical Stimulation;Iontophoresis 4mg /ml Dexamethasone;Moist Heat;Ultrasound;DME Instruction;Gait training;Stair training;Functional mobility training;Therapeutic activities;Therapeutic exercise;Balance training;Neuromuscular re-education;Patient/family education;Orthotic Fit/Training;Manual techniques;Passive range of motion;Dry needling;Taping;Joint Manipulations    PT Next Visit Plan  Progress hip strengthening, single limb stance, and foam surface balance exercises. Gait training with personal [place] for long distance, balance training. f/u with AFOs    PT Home Exercise Plan  Eval: seated heel and toe raises; 8/11: STS, SLS and tandem stance; 8/12: GTB and dorsiflexion printout; 8/21: LAQ, STS, calf raises standing, tandem stance at counter    Consulted and Agree with Plan of Care  Patient       Patient will benefit from skilled therapeutic intervention in order to improve the following deficits and impairments:  Abnormal gait, Decreased balance, Decreased coordination, Decreased range of motion, Decreased strength, Difficulty walking, Impaired sensation  Visit Diagnosis: Unsteadiness on feet  Other abnormalities of gait and  mobility  Muscle weakness (generalized)     Problem List Patient Active Problem List   Diagnosis Date Noted  . Bilateral foot-drop 04/02/2019  . Decreased appetite 08/18/2017  . Loss of weight 08/18/2017  . Dark stools 08/18/2017  . Morbid obesity ([place]) 05/02/2017  . Vitamin D deficiency 11/26/2015  . CLL (chronic lymphocytic leukemia) ([place]) 07/21/2015  . Mucosal abnormality of esophagus   . Mucosal abnormality of stomach   . Multiple gastric polyps   . History of colon cancer   . Diverticulosis of colon without hemorrhage   . Family history of prostate cancer   . Family history of colon cancer   . Family history of stomach cancer   . Benign prostatic hyperplasia with urinary obstruction 09/12/2014  . H/O prostate cancer 08/20/2014  . Radiation proctitis 08/19/2014  . H/O agent [place] exposure 07/01/2014  . Unstable angina ([place]) 06/16/2014  . Shingles outbreak, right anterior thoracic 01/28/2014  . Gait abnormality 12/18/2013  . Viral gastroenteritis 11/26/2013  . Dyslipidemia 02/12/2013  . Peripheral neuropathy 10/14/2012  . CAD (coronary artery disease) 10/14/2012  . HTN (hypertension) 10/14/2012  . Hx of adenomatous colonic polyps 07/26/2012  . Folic acid deficiency [id number]  . Iron deficiency 05/02/2012  . Cancer of ascending colon ([place]) 03/29/2012  .  GERD 03/09/2010  . PREMATURE VENTRICULAR CONTRACTIONS 04/30/2009     Talbot Grumbling PT, DPT 05/09/19, 12:11 PM [place] [address] [place], [place], [address] Phone: [phone]   Fax:  [phone]  Name: Natalie Corr. MRN: [id number] Date of Birth: 1935-04-21

## 2019-05-10 ENCOUNTER — Ambulatory Visit (HOSPITAL_COMMUNITY): Payer: Medicare Other

## 2019-05-10 ENCOUNTER — Encounter (HOSPITAL_COMMUNITY): Payer: Self-pay

## 2019-05-10 DIAGNOSIS — R2681 Unsteadiness on feet: Secondary | ICD-10-CM

## 2019-05-10 DIAGNOSIS — M6281 Muscle weakness (generalized): Secondary | ICD-10-CM

## 2019-05-10 DIAGNOSIS — R2689 Other abnormalities of gait and mobility: Secondary | ICD-10-CM

## 2019-05-10 NOTE — Therapy (Signed)
[place], [place], [address] Phone: [phone]   Fax:  [phone]  Physical Therapy Treatment  Patient Details  Name: David Pugh. MRN: [id number] Date of Birth: 06-09-1935 Referring Provider (PT): Kathrynn Ducking, MD   Encounter Date: 05/10/2019  PT End of Session - 05/10/19 1044    Visit Number  11    Number of Visits  14    Date for PT Re-Evaluation  05/04/19    Authorization Type  [employer]; Secondary: [employer] for life    Authorization Time Period  04/06/19 to 05/04/19; NEW: 05/01/19 to 05/25/19    Authorization - Visit Number  3   Reassessment compltted 05/01/19 visit 8   Authorization - Number of Visits  10    PT Start Time  1037    PT Stop Time  1115    PT Time Calculation (min)  38 min    Equipment Utilized During Treatment  Gait belt    Activity Tolerance  Patient tolerated treatment well    Behavior During Therapy  WFL for tasks assessed/performed       Past Medical History:  Diagnosis Date  . Abnormality of gait 12/18/2013  . Arthritis   . Bilateral foot-drop 04/02/2019  . Biliary dyskinesia   . CAD (coronary artery disease) 2012   a. DES x2 in ramus/OM and mid LAD in 2012  b. balloon angioplaty of OM 2015  . CLL (chronic lymphocytic leukemia) ([place])   . Colon cancer [facility]) 2013   Right hemicolectomy, did not tolerate chemotherapy  . Colon polyps    tubular adenomas  . Diverticula of colon 2010  . Essential hypertension   . Exposure to [employer]   . Folic acid deficiency   . Gastroparesis 2014  . GERD (gastroesophageal reflux disease)   . Glaucoma   . Heart palpitations 2014  . Hiatal hernia 2010  . History of anemia as a child   . History of kidney stones   . Iron deficiency 2013  . Myocardial infarction ([place]) 2012  . Peripheral neuropathy   . Prostate cancer [facility]) 2010   Radiation, surgery  . PTSD (post-traumatic stress disorder)   . Radiation proctitis   . Rosacea      Past Surgical History:  Procedure Laterality Date  . CARDIAC CATHETERIZATION  02/13/2011   [facility] cardiology  . CATARACT EXTRACTION     2011  . CATARACT EXTRACTION W/PHACO Right 08/06/2013   Procedure: CATARACT EXTRACTION PHACO AND INTRAOCULAR LENS PLACEMENT (IOC);  Surgeon: Tonny Branch, MD;  Location: [facility];  Service: Ophthalmology;  Laterality: Right;  CDE:20.96  . CHOLECYSTECTOMY    . COLONOSCOPY  02/12/2004   Dr. Gala Romney- L side diverticula, inflammatory  colon polyps  . COLONOSCOPY  01/19/2012   RMR: Prominent changes involving the rectal mucosa consistent with radiation-induced proctitis. Multiple colonic  polyps removed as described above. Sigmoid Diverticulosis/ 1.5 x 2 cm relatively flat ulcerated lesion in cecum/path showed adenocarcinoma of the colon. descending colon polyp with tubular adenoma and one fragment of focal high grade dysplasia.   . COLONOSCOPY  08/28/2012   [id number] proctitis. Colonic diverticulosis/Ulcerations at the surgical anastomosis  path: ulcerated colonic mucosa with prolapse changes, tubular adenoma.   . COLONOSCOPY N/A 08/02/2013   RMR: Colonic polyps -removed as described above. Colonic diverticulosis. Status post reight hemicolectomy. Radiation proctitis- asymptomatic.   Marland Kitchen COLONOSCOPY N/A 05/09/2015   Procedure: COLONOSCOPY;  Surgeon: Daneil Dolin, MD;  Location:  [facility];  Service: Endoscopy;  Laterality: N/A;  1200-moved to [address] to notify pt  . COLONOSCOPY WITH PROPOFOL N/A 11/10/2017   Procedure: COLONOSCOPY WITH PROPOFOL;  Surgeon: Daneil Dolin, MD;  Location: [facility];  Service: Endoscopy;  Laterality: N/A;  . CORONARY ANGIOPLASTY  06/17/14   OM1 POBA  . CORONARY STENT PLACEMENT  01/2011   2.5x1mm Xience drug-eluting stent in ramus intermedius/OMI vessel, 2.5x33mm Promus Element stent in mid LAD artery  . ESOPHAGOGASTRODUODENOSCOPY  10/10/2008   Dr. Tilda Burrow hiatal hernia, normal esophagus, normal stomach  .  ESOPHAGOGASTRODUODENOSCOPY  02/12/2004   Dr. Dudley Major- normal   . ESOPHAGOGASTRODUODENOSCOPY N/A 05/09/2015   Procedure: ESOPHAGOGASTRODUODENOSCOPY (EGD);  Surgeon: Daneil Dolin, MD;  [facility];  Service: Endoscopy;  Laterality: N/A;  . ESOPHAGOGASTRODUODENOSCOPY (EGD) WITH PROPOFOL N/A 11/10/2017   Procedure: ESOPHAGOGASTRODUODENOSCOPY (EGD) WITH PROPOFOL;  Surgeon: Daneil Dolin, MD;  [facility]: [facility];  Service: Endoscopy;  Laterality: N/A;  9:45am  . GIVENS CAPSULE STUDY N/A 06/03/2015   Procedure: GIVENS CAPSULE STUDY;  Surgeon: Daneil Dolin, MD;  [facility]: [facility];  Service: Endoscopy;  Laterality: N/A;  0700  . HOT HEMOSTASIS  11/10/2017   Procedure: HOT HEMOSTASIS (ARGON PLASMA COAGULATION/BICAP);  Surgeon: Daneil Dolin, MD;  Location: [facility];  Service: Endoscopy;;  rectum  . KNEE SURGERY     left knee   . LEFT HEART CATHETERIZATION WITH CORONARY ANGIOGRAM N/A 06/17/2014   Procedure: LEFT HEART CATHETERIZATION WITH CORONARY ANGIOGRAM;  Surgeon: Burnell Blanks, MD;  Location: [facility] CATH LAB;  Service: Cardiovascular;  Laterality: N/A;  . PARTIAL COLECTOMY  02/29/2012   Procedure: PARTIAL COLECTOMY;  Surgeon: Adin Hector, MD;  Location: [facility];  Service: General;  Laterality: Right;  . POLYPECTOMY  11/10/2017   Procedure: POLYPECTOMY;  Surgeon: Daneil Dolin, MD;  [facility];  Service: Endoscopy;;  gastric colon  . PROSTATECTOMY    . TONSILLECTOMY    . TRANSURETHRAL RESECTION OF PROSTATE    . VASECTOMY      There were no vitals filed for this visit.  Subjective Assessment - 05/10/19 1041    Subjective  Pt reports he is doing well today. 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    Limitations  Walking;House hold activities    How long can you sit comfortably?  no issues    How long can you stand comfortably?  no issues; back limits    How long can you walk comfortably?  no issues when uses walking stick/SPC in  community/outside    Diagnostic tests  none    Patient Stated Goals  regain balance    Currently in Pain?  No/denies           [facility] Adult PT Treatment/Exercise - 05/10/19 0001      Ambulation/Gait   Ambulation/Gait  --    Assistive device  --    Ambulation Surface  --    Gait Comments  --      Knee/Hip Exercises: Standing   Heel Raises Limitations  single leg, 15 reps    Forward Step Up  Both;10 reps;Hand Hold: 1;Step Height: 6"    Forward Step Up Limitations  Opposite knee drive for balance and strengthening    Gait Training  practicing speed changes, safely and quickly, using SPC, verbal cues for heel-toe pattern to improve toe clearance and reduce tripping  Balance Exercises - 05/10/19 1115      Balance Exercises: Standing   Gait with Head Turns  Forward   up/down, R/L with SPC   Tandem Gait  Forward;Foam/compliant surface;4 reps   //bars, stepping over 4 6-inch hurdles, single UE support   Sidestepping  Foam/compliant support;4 reps   //bars, stepping over 4 6-inch hurdles, single UE support   Lift / Chop Limitations  weaving in/out of cones 18 inches apart, x2RT blue line with and without SPC; sidestepping with flat foot cone taps, blue line x2RT with and without SPC    Other Standing Exercises  vector stance, forward/lateral/back, 5 sec hold, single UE support, x10 reps each LE        PT Education - 05/10/19 1043    Education Details  Exercise technique, updated HEP    Person(s) Educated  Patient    Methods  Explanation;Demonstration;Handout    Comprehension  Verbalized understanding;Returned demonstration       PT Short Term Goals - 05/01/19 1121      PT SHORT TERM GOAL #1   Title  Pt will be independent with HEP, update as needed and perform consistently.    Baseline  9/1: pt reports compliance without difficulty    Time  2    Period  Weeks    Status  Achieved    Target Date  04/20/19      PT SHORT TERM GOAL #2   Title  Pt will improve  ankle muscle strength by 1/2 grade to improve ability to perform funcitonal activities and reduce risk for falls.    Baseline  9/1: see MMT    Time  2    Period  Weeks    Status  On-going      PT SHORT TERM GOAL #3   Title  Pt will maintain SLS for 15 sec to demo improved functional strength and reduce risk for falls.    Baseline  9/1: 2 sec or < bil    Time  2    Period  Weeks    Status  On-going        PT Long Term Goals - 05/01/19 1122      PT LONG TERM GOAL #1   Title  Pt will improve muscle strength by 1 grade to improve ability to perform funcitonal activities and reduce risk for falls.    Baseline  9/1: see MMT    Time  4    Period  Weeks    Status  On-going      PT LONG TERM GOAL #2   Title  Pt will improve FGA score to >19 to reduce risk for falls and improve safety with ambulation around the community.    Baseline  9/1: 15    Time  4    Period  Weeks    Status  On-going      PT LONG TERM GOAL #3   Title  Pt will maintain SLS for 30 sec to demo improved functional strength and reduce risk for falls.    Baseline  9/1: 2 sec or < bil    Time  4    Period  Weeks    Status  On-going      PT LONG TERM GOAL #4   Title  Pt will perform 5x STS in 14.8 sec or < to indicate improve funcitonal strength and ability to move around home safely.    Baseline  9/1: 13 sec    Time  4    Period  Weeks    Status  Achieved            Plan - 05/10/19 1045    Clinical Impression Statement  Continued with established POC with increased focus on gait training this session. Performed sidestepping, weaving in/out of cones, head turns, and speed changes with gait training while using SPC improving balance from fair with frequent protective steps without AD to good with occasional protective steps with SPC. Verbal cues for increasing heel-toe pattern to clear foot appropriately and reduce tripping. Pt able to perform step up knee drives with improve steadiness throughout trunk  control and vector stance for up to 5 sec isometric hold with improve steadiness throughout trunk. Increased balance challenge with gait on foam surface stepping over hurdles forward and lateral, unable to remove both hands from // bars and requiring bil HHA 50% of the time. Continue to progress as able.    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  Evolving/Moderate complexity    Rehab Potential  Good    PT Frequency  2x / week    PT Duration  3 weeks    PT Treatment/Interventions  ADLs/Self Care Home Management;Aquatic Therapy;Biofeedback;Cryotherapy;Electrical Stimulation;Iontophoresis 4mg /ml Dexamethasone;Moist Heat;Ultrasound;DME Instruction;Gait training;Stair training;Functional mobility training;Therapeutic activities;Therapeutic exercise;Balance training;Neuromuscular re-education;Patient/family education;Orthotic Fit/Training;Manual techniques;Passive range of motion;Dry needling;Taping;Joint Manipulations    PT Next Visit Plan  Gait training with personal SPC, stepping over obstacles, gait on foam, etc. Progress hip strengthening, single limb stance, and foam surface balance exercises. f/u with AFOs    PT Home Exercise Plan  Eval: seated heel and toe raises; 8/11: STS, SLS and tandem stance; 8/12: GTB and dorsiflexion printout; 8/21: LAQ, STS, calf raises standing, tandem stance at counter; 9/10: vector stance forward/lateral/back with 5 sec hold    Consulted and Agree with Plan of Care  Patient       Patient will benefit from skilled therapeutic intervention in order to improve the following deficits and impairments:  Abnormal gait, Decreased balance, Decreased coordination, Decreased range of  motion, Decreased strength, Difficulty walking, Impaired sensation  Visit Diagnosis: Unsteadiness on feet  Other abnormalities of gait and mobility  Muscle weakness (generalized)     Problem List Patient Active Problem List   Diagnosis Date Noted  . Bilateral foot-drop 04/02/2019  . Decreased appetite 08/18/2017  . Loss of weight 08/18/2017  . Dark stools 08/18/2017  . Morbid obesity ([place]) 05/02/2017  . Vitamin D deficiency 11/26/2015  . CLL (chronic lymphocytic leukemia) ([place]) 07/21/2015  . Mucosal abnormality of esophagus   . Mucosal abnormality of stomach   . Multiple gastric polyps   . History of colon cancer   . Diverticulosis of colon without hemorrhage   . Family history of prostate cancer   . Family history of colon cancer   . Family history of stomach cancer   . Benign prostatic hyperplasia with urinary obstruction 09/12/2014  . H/O prostate cancer 08/20/2014  . Radiation proctitis 08/19/2014  . H/O agent Orange exposure 07/01/2014  . Unstable angina ([place]) 06/16/2014  . Shingles outbreak, right anterior thoracic 01/28/2014  . Gait abnormality 12/18/2013  . Viral gastroenteritis 11/26/2013  . Dyslipidemia 02/12/2013  . Peripheral neuropathy 10/14/2012  . CAD (coronary artery disease) 10/14/2012  . HTN (hypertension) 10/14/2012  . Hx  of adenomatous colonic polyps 07/26/2012  . Folic acid deficiency [id number]  . Iron deficiency 05/02/2012  . Cancer of ascending colon ([place]) 03/29/2012  . GERD 03/09/2010  . PREMATURE VENTRICULAR CONTRACTIONS 04/30/2009    Talbot Grumbling PT, DPT 05/10/19, 12:38 PM [place] [address] [place], [place], [address] Phone: [phone]   Fax:  [phone]  Name: David Pugh. MRN: [id number] Date of Birth: [birth year]

## 2019-05-15 ENCOUNTER — Other Ambulatory Visit: Payer: Self-pay

## 2019-05-15 ENCOUNTER — Ambulatory Visit (HOSPITAL_COMMUNITY): Payer: Medicare Other

## 2019-05-15 ENCOUNTER — Telehealth (HOSPITAL_COMMUNITY): Payer: Self-pay

## 2019-05-15 ENCOUNTER — Encounter (HOSPITAL_COMMUNITY): Payer: Self-pay

## 2019-05-15 DIAGNOSIS — R2689 Other abnormalities of gait and mobility: Secondary | ICD-10-CM

## 2019-05-15 DIAGNOSIS — M6281 Muscle weakness (generalized): Secondary | ICD-10-CM

## 2019-05-15 DIAGNOSIS — R2681 Unsteadiness on feet: Secondary | ICD-10-CM | POA: Diagnosis not present

## 2019-05-15 NOTE — Telephone Encounter (Signed)
Entry error.  Talbot Grumbling PT, DPT 05/15/19, 1:39 PM [phone]

## 2019-05-15 NOTE — Therapy (Signed)
[place], [place], [address] Phone: [phone]   Fax:  [phone]  Physical Therapy Treatment  Patient Details  Name: David Pugh. MRN: [id number] Date of Birth: May 24, 1935 Referring Provider (PT): Kathrynn Ducking, MD   Encounter Date: 05/15/2019  PT End of Session - 05/15/19 1342    Visit Number  12    Number of Visits  14    Date for PT Re-Evaluation  05/04/19    Authorization Type  [employer]; Secondary: [employer] for life    Authorization Time Period  04/06/19 to 05/04/19; NEW: 05/01/19 to 05/25/19    Authorization - Visit Number  4   Reassessment compltted 05/01/19 visit 8   Authorization - Number of Visits  10    PT Start Time  1345    PT Stop Time  1430    PT Time Calculation (min)  45 min    Equipment Utilized During Treatment  Gait belt    Activity Tolerance  Patient tolerated treatment well    Behavior During Therapy  WFL for tasks assessed/performed       Past Medical History:  Diagnosis Date  . Abnormality of gait 12/18/2013  . Arthritis   . Bilateral foot-drop 04/02/2019  . Biliary dyskinesia   . CAD (coronary artery disease) 2012   a. DES x2 in ramus/OM and mid LAD in 2012  b. balloon angioplaty of OM 2015  . CLL (chronic lymphocytic leukemia) ([place])   . Colon cancer [facility]) 2013   Right hemicolectomy, did not tolerate chemotherapy  . Colon polyps    tubular adenomas  . Diverticula of colon 2010  . Essential hypertension   . Exposure to [employer]   . Folic acid deficiency   . Gastroparesis 2014  . GERD (gastroesophageal reflux disease)   . Glaucoma   . Heart palpitations 2014  . Hiatal hernia 2010  . History of anemia as a child   . History of kidney stones   . Iron deficiency 2013  . Myocardial infarction ([place]) 2012  . Peripheral neuropathy   . Prostate cancer [facility]) 2010   Radiation, surgery  . PTSD (post-traumatic stress disorder)   . Radiation proctitis   . Rosacea      Past Surgical History:  Procedure Laterality Date  . CARDIAC CATHETERIZATION  02/13/2011   [facility], [facility] cardiology  . CATARACT EXTRACTION     2011  . CATARACT EXTRACTION W/PHACO Right 08/06/2013   Procedure: CATARACT EXTRACTION PHACO AND INTRAOCULAR LENS PLACEMENT (IOC);  Surgeon: Tonny Branch, MD;  Location: [facility] OR[facility];  Service: Ophthalmology;  Laterality: Right;  CDE:20.96  . CHOLECYSTECTOMY    . COLONOSCOPY  02/12/2004   Dr. Gala Romney- L side diverticula, inflammatory  colon polyps  . COLONOSCOPY  01/19/2012   RMR: Prominent changes involving the rectal mucosa consistent with radiation-induced proctitis. Multiple colonic  polyps removed as described above. Sigmoid Diverticulosis/ 1.5 x 2 cm relatively flat ulcerated lesion in cecum/path showed adenocarcinoma of the colon. descending colon polyp with tubular adenoma and one fragment of focal high grade dysplasia.   . COLONOSCOPY  08/28/2012   [id number] proctitis. Colonic diverticulosis/Ulcerations at the surgical anastomosis  path: ulcerated colonic mucosa with prolapse changes, tubular adenoma.   . COLONOSCOPY N/A 08/02/2013   RMR: Colonic polyps -removed as described above. Colonic diverticulosis. Status post reight hemicolectomy. Radiation proctitis- asymptomatic.   Marland Kitchen COLONOSCOPY N/A 05/09/2015   Procedure: COLONOSCOPY;  Surgeon: Daneil Dolin, MD;  Location[facility]  [facility];  Service: Endoscopy;  Laterality: N/A;  1200-moved to [address] to notify pt  . COLONOSCOPY WITH PROPOFOL N/A 11/10/2017   Procedure: COLONOSCOPY WITH PROPOFOL;  Surgeon: Daneil Dolin, MD;  Location: [facility];  Service: Endoscopy;  Laterality: N/A;  . CORONARY ANGIOPLASTY  06/17/14   OM1 POBA  . CORONARY STENT PLACEMENT  01/2011   2.5x66mm Xience drug-eluting stent in ramus intermedius/OMI vessel, 2.5x11mm Promus Element stent in mid LAD artery  . ESOPHAGOGASTRODUODENOSCOPY  10/10/2008   Dr. Tilda Burrow hiatal hernia, normal esophagus, normal stomach  .  ESOPHAGOGASTRODUODENOSCOPY  02/12/2004   Dr. Dudley Major- normal   . ESOPHAGOGASTRODUODENOSCOPY N/A 05/09/2015   Procedure: ESOPHAGOGASTRODUODENOSCOPY (EGD);  Surgeon: Daneil Dolin, MD;  [facility];  Service: Endoscopy;  Laterality: N/A;  . ESOPHAGOGASTRODUODENOSCOPY (EGD) WITH PROPOFOL N/A 11/10/2017   Procedure: ESOPHAGOGASTRODUODENOSCOPY (EGD) WITH PROPOFOL;  Surgeon: Daneil Dolin, MD;  [facility];  Service: Endoscopy;  Laterality: N/A;  9:45am  . GIVENS CAPSULE STUDY N/A 06/03/2015   Procedure: GIVENS CAPSULE STUDY;  Surgeon: Daneil Dolin, MD;  [facility];  Service: Endoscopy;  Laterality: N/A;  0700  . HOT HEMOSTASIS  11/10/2017   Procedure: HOT HEMOSTASIS (ARGON PLASMA COAGULATION/BICAP);  Surgeon: Daneil Dolin, MD;  Location: [facility];  Service: Endoscopy;;  rectum  . KNEE SURGERY     left knee   . LEFT HEART CATHETERIZATION WITH CORONARY ANGIOGRAM N/A 06/17/2014   Procedure: LEFT HEART CATHETERIZATION WITH CORONARY ANGIOGRAM;  Surgeon: Burnell Blanks, MD;  Location: [facility] CATH LAB;  Service: Cardiovascular;  Laterality: N/A;  . PARTIAL COLECTOMY  02/29/2012   Procedure: PARTIAL COLECTOMY;  Surgeon: Adin Hector, MD;  Location: [facility];  Service: General;  Laterality: Right;  . POLYPECTOMY  11/10/2017   Procedure: POLYPECTOMY;  Surgeon: Daneil Dolin, MD;  [facility];  Service: Endoscopy;;  gastric colon  . PROSTATECTOMY    . TONSILLECTOMY    . TRANSURETHRAL RESECTION OF PROSTATE    . VASECTOMY      There were no vitals filed for this visit.  Subjective Assessment - 05/15/19 1346    Subjective  Pt reports [employer] said his AFOs would be in this week, requested Wed/Thursday and she sounded uncertain but was going to see what she could do.    Limitations  Walking;House hold activities    How long can you sit comfortably?  no issues    How long can you stand comfortably?  no issues; back limits    How long can you walk  comfortably?  no issues when uses walking stick/SPC in community/outside    Diagnostic tests  none    Patient Stated Goals  regain balance    Currently in Pain?  No/denies          [facility] Adult PT Treatment/Exercise - 05/15/19 0001      Knee/Hip Exercises: Standing   Heel Raises Limitations  single leg, 15 reps    Gait Training  With and without personal [facility] practicing forward weaving in/out of cones, lateral weaving around cones, retro gait, stepping over 6-inch hurdles, changing speeds, 180 degree turns, head turns up/down and R/L, tandem gait on foam surface; 50 ft x2RT each activity          Balance Exercises - 05/15/19 1422      Balance Exercises: Standing   SLS  Eyes open;Solid surface;Time   3-5 sec intervals x1 minute each LE  Lift / Chop Limitations  Warrior I and II, 4x5 sec holds    Other Standing Exercises  vector stance, forward/lateral/back, 5 sec hold, single UE support, x10 reps each LE        PT Education - 05/15/19 1446    Education Details  Gait with and without SPC, gait tasks and balance challenges associated with variations, continue HEP    Person(s) Educated  Patient    Methods  Explanation    Comprehension  Verbalized understanding       PT Short Term Goals - 05/01/19 1121      PT SHORT TERM GOAL #1   Title  Pt will be independent with HEP, update as needed and perform consistently.    Baseline  9/1: pt reports compliance without difficulty    Time  2    Period  Weeks    Status  Achieved    Target Date  04/20/19      PT SHORT TERM GOAL #2   Title  Pt will improve ankle muscle strength by 1/2 grade to improve ability to perform funcitonal activities and reduce risk for falls.    Baseline  9/1: see MMT    Time  2    Period  Weeks    Status  On-going      PT SHORT TERM GOAL #3   Title  Pt will maintain SLS for 15 sec to demo improved functional strength and reduce risk for falls.    Baseline  9/1: 2 sec or < bil    Time  2    Period   Weeks    Status  On-going        PT Long Term Goals - 05/01/19 1122      PT LONG TERM GOAL #1   Title  Pt will improve muscle strength by 1 grade to improve ability to perform funcitonal activities and reduce risk for falls.    Baseline  9/1: see MMT    Time  4    Period  Weeks    Status  On-going      PT LONG TERM GOAL #2   Title  Pt will improve FGA score to >19 to reduce risk for falls and improve safety with ambulation around the community.    Baseline  9/1: 15    Time  4    Period  Weeks    Status  On-going      PT LONG TERM GOAL #3   Title  Pt will maintain SLS for 30 sec to demo improved functional strength and reduce risk for falls.    Baseline  9/1: 2 sec or < bil    Time  4    Period  Weeks    Status  On-going      PT LONG TERM GOAL #4   Title  Pt will perform 5x STS in 14.8 sec or < to indicate improve funcitonal strength and ability to move around home safely.    Baseline  9/1: 13 sec    Time  4    Period  Weeks    Status  Achieved            Plan - 05/15/19 1343    Clinical Impression Statement  Focus on gait training without SPC to improve strategies of bil step length, bil foot clearance and balance deficits in challenging environments. Added SPC to gait training activities to improve safety and balance, pt with significant improvement in all areas when  using SPC, able to complete tasks without protective stepping, improved bil toe clearance, improved gait speed, and improved turning speed and balance. Added yoga warrior I and II poses this date to challenge balance when lower body and upper body are facing different planes and with eye gaze in different direction as well, only able to maintain for up to 5 seconds before protective stepping or requiring break out of position. Continue to progress as able.    Personal Factors and Comorbidities  Age;Comorbidity 3+;Fitness;Past/Current Experience;Time since onset of injury/illness/exacerbation     Comorbidities  see above    Examination-Activity Limitations  Locomotion Level;Transfers    Examination-Participation Restrictions  Community Activity;Yard Work    [employer]  Evolving/Moderate complexity    Rehab Potential  Good    PT Frequency  2x / week    PT Duration  3 weeks    PT Treatment/Interventions  ADLs/Self Care Home Management;Aquatic Therapy;Biofeedback;Cryotherapy;Electrical Stimulation;Iontophoresis 4mg /ml Dexamethasone;Moist Heat;Ultrasound;DME Instruction;Gait training;Stair training;Functional mobility training;Therapeutic activities;Therapeutic exercise;Balance training;Neuromuscular re-education;Patient/family education;Orthotic Fit/Training;Manual techniques;Passive range of motion;Dry needling;Taping;Joint Manipulations    PT Next Visit Plan  Continue gait training with personal SPC in variety of environments. Progress hip strengthening, single limb stance, and foam surface balance exercises. f/u with AFOs    PT Home Exercise Plan  Eval: seated heel and toe raises; 8/11: STS, SLS and tandem stance; 8/12: GTB and dorsiflexion printout; 8/21: LAQ, STS, calf raises standing, tandem stance at counter; 9/10: vector stance forward/lateral/back with 5 sec hold    Consulted and Agree with Plan of Care  Patient       Patient will benefit from skilled therapeutic intervention in order to improve the following deficits and impairments:  Abnormal gait, Decreased balance, Decreased coordination, Decreased range of motion, Decreased strength, Difficulty walking, Impaired sensation  Visit Diagnosis: Unsteadiness on feet  Other abnormalities of gait and mobility  Muscle weakness (generalized)     Problem List Patient Active Problem List   Diagnosis Date Noted  . Bilateral foot-drop 04/02/2019  . Decreased appetite 08/18/2017  . Loss of weight 08/18/2017  . Dark stools 08/18/2017  . Morbid obesity ([place]) 05/02/2017  . Vitamin D deficiency 11/26/2015  .  CLL (chronic lymphocytic leukemia) ([place]) 07/21/2015  . Mucosal abnormality of esophagus   . Mucosal abnormality of stomach   . Multiple gastric polyps   . History of colon cancer   . Diverticulosis of colon without hemorrhage   . Family history of prostate cancer   . Family history of colon cancer   . Family history of stomach cancer   . Benign prostatic hyperplasia with urinary obstruction 09/12/2014  . H/O prostate cancer 08/20/2014  . Radiation proctitis 08/19/2014  . H/O agent Orange exposure 07/01/2014  . Unstable angina ([place]) 06/16/2014  . Shingles outbreak, right anterior thoracic 01/28/2014  . Gait abnormality 12/18/2013  . Viral gastroenteritis 11/26/2013  . Dyslipidemia 02/12/2013  . Peripheral neuropathy 10/14/2012  . CAD (coronary artery disease) 10/14/2012  . HTN (hypertension) 10/14/2012  . Hx of adenomatous colonic polyps 07/26/2012  . Folic acid deficiency [id number]  . Iron deficiency 05/02/2012  . Cancer of ascending colon ([place]) 03/29/2012  . GERD 03/09/2010  . PREMATURE VENTRICULAR CONTRACTIONS 04/30/2009     Talbot Grumbling PT, DPT 05/15/19, 2:51 PM [facility] [address] [place], [place], [address] Phone: [phone]   Fax:  [phone]  Name: David Pugh. MRN: [id number] Date of Birth: Dec 28, 1934

## 2019-05-18 ENCOUNTER — Ambulatory Visit (HOSPITAL_COMMUNITY): Payer: Medicare Other

## 2019-05-18 ENCOUNTER — Encounter (HOSPITAL_COMMUNITY): Payer: Self-pay

## 2019-05-18 ENCOUNTER — Other Ambulatory Visit: Payer: Self-pay

## 2019-05-18 DIAGNOSIS — R2681 Unsteadiness on feet: Secondary | ICD-10-CM | POA: Diagnosis not present

## 2019-05-18 DIAGNOSIS — M6281 Muscle weakness (generalized): Secondary | ICD-10-CM

## 2019-05-18 DIAGNOSIS — R2689 Other abnormalities of gait and mobility: Secondary | ICD-10-CM

## 2019-05-18 NOTE — Therapy (Signed)
[place], [place], [address] Phone: [phone]   Fax:  [phone]  Physical Therapy Treatment  Patient Details  Name: David Pugh. MRN: [id number] Date of Birth: April 10, 1935 Referring Provider (PT): Kathrynn Ducking, MD   Encounter Date: 05/18/2019  PT End of Session - 05/18/19 1347    Visit Number  13    Number of Visits  14    Date for PT Re-Evaluation  05/04/19    Authorization Type  [employer]; Secondary: [employer] for life    Authorization Time Period  04/06/19 to 05/04/19; NEW: 05/01/19 to 05/25/19    Authorization - Visit Number  5   Reassessment compltted 05/01/19 visit 8   Authorization - Number of Visits  10    PT Start Time  [id number]    PT Stop Time  1425    PT Time Calculation (min)  40 min    Equipment Utilized During Treatment  Gait belt    Activity Tolerance  Patient tolerated treatment well    Behavior During Therapy  WFL for tasks assessed/performed       Past Medical History:  Diagnosis Date  . Abnormality of gait 12/18/2013  . Arthritis   . Bilateral foot-drop 04/02/2019  . Biliary dyskinesia   . CAD (coronary artery disease) 2012   a. DES x2 in ramus/OM and mid LAD in 2012  b. balloon angioplaty of OM 2015  . CLL (chronic lymphocytic leukemia) ([place])   . Colon cancer [facility]) 2013   Right hemicolectomy, did not tolerate chemotherapy  . Colon polyps    tubular adenomas  . Diverticula of colon 2010  . Essential hypertension   . Exposure to [employer]   . Folic acid deficiency   . Gastroparesis 2014  . GERD (gastroesophageal reflux disease)   . Glaucoma   . Heart palpitations 2014  . Hiatal hernia 2010  . History of anemia as a child   . History of kidney stones   . Iron deficiency 2013  . Myocardial infarction ([place]) 2012  . Peripheral neuropathy   . Prostate cancer [facility]) 2010   Radiation, surgery  . PTSD (post-traumatic stress disorder)   . Radiation proctitis   . Rosacea      Past Surgical History:  Procedure Laterality Date  . CARDIAC CATHETERIZATION  02/13/2011   [facility] cardiology  . CATARACT EXTRACTION     2011  . CATARACT EXTRACTION W/PHACO Right 08/06/2013   Procedure: CATARACT EXTRACTION PHACO AND INTRAOCULAR LENS PLACEMENT (IOC);  Surgeon: Tonny Branch, MD;  Location: [facility];  Service: Ophthalmology;  Laterality: Right;  CDE:20.96  . CHOLECYSTECTOMY    . COLONOSCOPY  02/12/2004   Dr. Gala Romney- L side diverticula, inflammatory  colon polyps  . COLONOSCOPY  01/19/2012   RMR: Prominent changes involving the rectal mucosa consistent with radiation-induced proctitis. Multiple colonic  polyps removed as described above. Sigmoid Diverticulosis/ 1.5 x 2 cm relatively flat ulcerated lesion in cecum/path showed adenocarcinoma of the colon. descending colon polyp with tubular adenoma and one fragment of focal high grade dysplasia.   . COLONOSCOPY  08/28/2012   [id number] proctitis. Colonic diverticulosis/Ulcerations at the surgical anastomosis  path: ulcerated colonic mucosa with prolapse changes, tubular adenoma.   . COLONOSCOPY N/A 08/02/2013   RMR: Colonic polyps -removed as described above. Colonic diverticulosis. Status post reight hemicolectomy. Radiation proctitis- asymptomatic.   Marland Kitchen COLONOSCOPY N/A 05/09/2015   Procedure: COLONOSCOPY;  Surgeon: Daneil Dolin, MD;  [facility]  [facility];  Service: Endoscopy;  Laterality: N/A;  1200-moved to [address] to notify pt  . COLONOSCOPY WITH PROPOFOL N/A 11/10/2017   Procedure: COLONOSCOPY WITH PROPOFOL;  Surgeon: Daneil Dolin, MD;  Location: [facility];  Service: Endoscopy;  Laterality: N/A;  . CORONARY ANGIOPLASTY  06/17/14   OM1 POBA  . CORONARY STENT PLACEMENT  01/2011   2.5x60mm Xience drug-eluting stent in ramus intermedius/OMI vessel, 2.5x71mm Promus Element stent in mid LAD artery  . ESOPHAGOGASTRODUODENOSCOPY  10/10/2008   Dr. Tilda Burrow hiatal hernia, normal esophagus, normal stomach  .  ESOPHAGOGASTRODUODENOSCOPY  02/12/2004   Dr. Dudley Major- normal   . ESOPHAGOGASTRODUODENOSCOPY N/A 05/09/2015   Procedure: ESOPHAGOGASTRODUODENOSCOPY (EGD);  Surgeon: Daneil Dolin, MD;  [facility];  Service: Endoscopy;  Laterality: N/A;  . ESOPHAGOGASTRODUODENOSCOPY (EGD) WITH PROPOFOL N/A 11/10/2017   Procedure: ESOPHAGOGASTRODUODENOSCOPY (EGD) WITH PROPOFOL;  Surgeon: Daneil Dolin, MD;  [facility];  Service: Endoscopy;  Laterality: N/A;  9:45am  . GIVENS CAPSULE STUDY N/A 06/03/2015   Procedure: GIVENS CAPSULE STUDY;  Surgeon: Daneil Dolin, MD;  [facility];  Service: Endoscopy;  Laterality: N/A;  0700  . HOT HEMOSTASIS  11/10/2017   Procedure: HOT HEMOSTASIS (ARGON PLASMA COAGULATION/BICAP);  Surgeon: Daneil Dolin, MD;  [facility];  Service: Endoscopy;;  rectum  . KNEE SURGERY     left knee   . LEFT HEART CATHETERIZATION WITH CORONARY ANGIOGRAM N/A 06/17/2014   Procedure: LEFT HEART CATHETERIZATION WITH CORONARY ANGIOGRAM;  Surgeon: Burnell Blanks, MD;  Location: [facility] CATH LAB;  Service: Cardiovascular;  Laterality: N/A;  . PARTIAL COLECTOMY  02/29/2012   Procedure: PARTIAL COLECTOMY;  Surgeon: Adin Hector, MD;  Location: [facility];  Service: General;  Laterality: Right;  . POLYPECTOMY  11/10/2017   Procedure: POLYPECTOMY;  Surgeon: Daneil Dolin, MD;  [facility];  Service: Endoscopy;;  gastric colon  . PROSTATECTOMY    . TONSILLECTOMY    . TRANSURETHRAL RESECTION OF PROSTATE    . VASECTOMY      There were no vitals filed for this visit.  Subjective Assessment - 05/18/19 1346    Subjective  Pt reports picking up AFOs on Monday prior to therapy session.    Limitations  Walking;House hold activities    How long can you sit comfortably?  no issues    How long can you stand comfortably?  no issues; back limits    How long can you walk comfortably?  no issues when uses walking stick/SPC in community/outside     Diagnostic tests  none    Patient Stated Goals  regain balance    Currently in Pain?  No/denies             [facility] Adult PT Treatment/Exercise - 05/18/19 0001      Knee/Hip Exercises: Standing   Gait Training  With personal [facility] practicing forward sidestepping and tandem gait on foam surface, forward and side stepping over obstacles, marching, 180 degree turns, head turns up/down and R/L, 36ft x2RT each activity          Balance Exercises - 05/18/19 1406      Balance Exercises: Standing   Standing Eyes Opened  Narrow base of support (BOS);Foam/compliant surface;30 secs    SLS  Eyes open;Time;Foam/compliant surface;Upper extremity support 1;2 reps;30 secs    Toe Raise Limitations  standing on foam surface, alternating flat foot cone taps, midline and crossing midline, x10 reps each  Lift / Chop Limitations  Warrior I and II, 4x10 sec holds    Other Standing Exercises  SLS on solid surface, 2x30 sec BLE        PT Education - 05/18/19 1346    Education Details  Exercise technique, continue HEP    Person(s) Educated  Patient    Methods  Explanation    Comprehension  Verbalized understanding       PT Short Term Goals - 05/01/19 1121      PT SHORT TERM GOAL #1   Title  Pt will be independent with HEP, update as needed and perform consistently.    Baseline  9/1: pt reports compliance without difficulty    Time  2    Period  Weeks    Status  Achieved    Target Date  04/20/19      PT SHORT TERM GOAL #2   Title  Pt will improve ankle muscle strength by 1/2 grade to improve ability to perform funcitonal activities and reduce risk for falls.    Baseline  9/1: see MMT    Time  2    Period  Weeks    Status  On-going      PT SHORT TERM GOAL #3   Title  Pt will maintain SLS for 15 sec to demo improved functional strength and reduce risk for falls.    Baseline  9/1: 2 sec or < bil    Time  2    Period  Weeks    Status  On-going        PT Long Term Goals - 05/01/19  1122      PT LONG TERM GOAL #1   Title  Pt will improve muscle strength by 1 grade to improve ability to perform funcitonal activities and reduce risk for falls.    Baseline  9/1: see MMT    Time  4    Period  Weeks    Status  On-going      PT LONG TERM GOAL #2   Title  Pt will improve FGA score to >19 to reduce risk for falls and improve safety with ambulation around the community.    Baseline  9/1: 15    Time  4    Period  Weeks    Status  On-going      PT LONG TERM GOAL #3   Title  Pt will maintain SLS for 30 sec to demo improved functional strength and reduce risk for falls.    Baseline  9/1: 2 sec or < bil    Time  4    Period  Weeks    Status  On-going      PT LONG TERM GOAL #4   Title  Pt will perform 5x STS in 14.8 sec or < to indicate improve funcitonal strength and ability to move around home safely.    Baseline  9/1: 13 sec    Time  4    Period  Weeks    Status  Achieved            Plan - 05/18/19 1347    Clinical Impression Statement  Continued with established POC. Pt able to hold yoga poses for 10 sec isometric hold, but requires single UE assist to assume split stance position. Continued gait training with SPC in variety of setting with good balance on firm surface and fair balance on foam surfaces. Progressed balance training with flat foot taps to cone while standing on  foam surface requiring single UE assist with balance. Pt continues to demonstrate good stepping strategies with gait and balance challenges. Plan to gait train with AFOs next session and prepare for d/c due to progress with skilled PT.    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  Evolving/Moderate  complexity    Rehab Potential  Good    PT Frequency  2x / week    PT Duration  3 weeks    PT Treatment/Interventions  ADLs/Self Care Home Management;Aquatic Therapy;Biofeedback;Cryotherapy;Electrical Stimulation;Iontophoresis 4mg /ml Dexamethasone;Moist Heat;Ultrasound;DME Instruction;Gait training;Stair training;Functional mobility training;Therapeutic activities;Therapeutic exercise;Balance training;Neuromuscular re-education;Patient/family education;Orthotic Fit/Training;Manual techniques;Passive range of motion;Dry needling;Taping;Joint Manipulations    PT Next Visit Plan  Gait trainining with AFOs, prepare for d/c.    PT Home Exercise Plan  Eval: seated heel and toe raises; 8/11: STS, SLS and tandem stance; 8/12: GTB and dorsiflexion printout; 8/21: LAQ, STS, calf raises standing, tandem stance at counter; 9/10: vector stance forward/lateral/back with 5 sec hold    Consulted and Agree with Plan of Care  Patient       Patient will benefit from skilled therapeutic intervention in order to improve the following deficits and impairments:  Abnormal gait, Decreased balance, Decreased coordination, Decreased range of motion, Decreased strength, Difficulty walking, Impaired sensation  Visit Diagnosis: Unsteadiness on feet  Other abnormalities of gait and mobility  Muscle weakness (generalized)     Problem List Patient Active Problem List   Diagnosis Date Noted  . Bilateral foot-drop 04/02/2019  . Decreased appetite 08/18/2017  . Loss of weight 08/18/2017  . Dark stools 08/18/2017  . Morbid obesity ([place]) 05/02/2017  . Vitamin D deficiency 11/26/2015  . CLL (chronic lymphocytic leukemia) ([place]) 07/21/2015  . Mucosal abnormality of esophagus   . Mucosal abnormality of stomach   . Multiple gastric polyps   . History of colon cancer   . Diverticulosis of colon without hemorrhage   . Family history of prostate cancer   . Family history of colon cancer   . Family history of stomach cancer    . Benign prostatic hyperplasia with urinary obstruction 09/12/2014  . H/O prostate cancer 08/20/2014  . Radiation proctitis 08/19/2014  . H/O agent Orange exposure 07/01/2014  . Unstable angina ([place]) 06/16/2014  . Shingles outbreak, right anterior thoracic 01/28/2014  . Gait abnormality 12/18/2013  . Viral gastroenteritis 11/26/2013  . Dyslipidemia 02/12/2013  . Peripheral neuropathy 10/14/2012  . CAD (coronary artery disease) 10/14/2012  . HTN (hypertension) 10/14/2012  . Hx of adenomatous colonic polyps 07/26/2012  . Folic acid deficiency [id number]  . Iron deficiency 05/02/2012  . Cancer of ascending colon ([place]) 03/29/2012  . GERD 03/09/2010  . PREMATURE VENTRICULAR CONTRACTIONS 04/30/2009    Talbot Grumbling PT, DPT 05/18/19, 2:39 PM [place] [address] [place], [place], [address] Phone: [phone]   Fax:  [phone]  Name: Mariana Arens. MRN: [id number] Date of Birth: [birth year]

## 2019-05-21 ENCOUNTER — Other Ambulatory Visit: Payer: Self-pay

## 2019-05-21 ENCOUNTER — Ambulatory Visit (HOSPITAL_COMMUNITY): Payer: Medicare Other

## 2019-05-21 ENCOUNTER — Encounter (HOSPITAL_COMMUNITY): Payer: Self-pay

## 2019-05-21 DIAGNOSIS — M6281 Muscle weakness (generalized): Secondary | ICD-10-CM

## 2019-05-21 DIAGNOSIS — R2681 Unsteadiness on feet: Secondary | ICD-10-CM | POA: Diagnosis not present

## 2019-05-21 DIAGNOSIS — R2689 Other abnormalities of gait and mobility: Secondary | ICD-10-CM

## 2019-05-21 NOTE — Therapy (Signed)
[place], [place], [address] Phone: [phone]   Fax:  [phone]      Physical Therapy Treatment  Patient Details  Name: David Pugh. MRN: [id number] Date of Birth: 1934/09/24 Referring Provider (PT): Kathrynn Ducking, MD   Encounter Date: 05/21/2019  PT End of Session - 05/21/19 1442    Visit Number  14    Number of Visits  15    Date for PT Re-Evaluation  05/04/19    Authorization Type  [employer]; Secondary: [employer] for life    Authorization Time Period  04/06/19 to 05/04/19; NEW: 05/01/19 to 05/25/19    Authorization - Visit Number  6   Reassessment compltted 05/01/19 visit 8   Authorization - Number of Visits  10    PT Start Time  1435    PT Stop Time  1505    PT Time Calculation (min)  30 min    Activity Tolerance  Patient tolerated treatment well    Behavior During Therapy  [facility] for tasks assessed/performed       Past Medical History:  Diagnosis Date  . Abnormality of gait 12/18/2013  . Arthritis   . Bilateral foot-drop 04/02/2019  . Biliary dyskinesia   . CAD (coronary artery disease) 2012   a. DES x2 in ramus/OM and mid LAD in 2012  b. balloon angioplaty of OM 2015  . CLL (chronic lymphocytic leukemia) ([place])   . Colon cancer [facility]) 2013   Right hemicolectomy, did not tolerate chemotherapy  . Colon polyps    tubular adenomas  . Diverticula of colon 2010  . Essential hypertension   . Exposure to [employer]   . Folic acid deficiency   . Gastroparesis 2014  . GERD (gastroesophageal reflux disease)   . Glaucoma   . Heart palpitations 2014  . Hiatal hernia 2010  . History of anemia as a child   . History of kidney stones   . Iron deficiency 2013  . Myocardial infarction ([place]) 2012  . Peripheral neuropathy   . Prostate cancer [facility]) 2010   Radiation, surgery  . PTSD (post-traumatic stress disorder)   . Radiation proctitis   . Rosacea     Past Surgical History:  Procedure  Laterality Date  . CARDIAC CATHETERIZATION  02/13/2011   [facility] cardiology  . CATARACT EXTRACTION     2011  . CATARACT EXTRACTION W/PHACO Right 08/06/2013   Procedure: CATARACT EXTRACTION PHACO AND INTRAOCULAR LENS PLACEMENT (IOC);  Surgeon: Tonny Branch, MD;  Location: [facility] OR[facility];  Service: Ophthalmology;  Laterality: Right;  CDE:20.96  . CHOLECYSTECTOMY    . COLONOSCOPY  02/12/2004   Dr. Gala Romney- L side diverticula, inflammatory  colon polyps  . COLONOSCOPY  01/19/2012   RMR: Prominent changes involving the rectal mucosa consistent with radiation-induced proctitis. Multiple colonic  polyps removed as described above. Sigmoid Diverticulosis/ 1.5 x 2 cm relatively flat ulcerated lesion in cecum/path showed adenocarcinoma of the colon. descending colon polyp with tubular adenoma and one fragment of focal high grade dysplasia.   . COLONOSCOPY  08/28/2012   [id number] proctitis. Colonic diverticulosis/Ulcerations at the surgical anastomosis  path: ulcerated colonic mucosa with prolapse changes, tubular adenoma.   . COLONOSCOPY N/A 08/02/2013   RMR: Colonic polyps -removed as described above. Colonic diverticulosis. Status post reight hemicolectomy. Radiation proctitis- asymptomatic.   Marland Kitchen COLONOSCOPY N/A 05/09/2015   Procedure: COLONOSCOPY;  Surgeon: Daneil Dolin, MD;  Location: [facility];  Service: Endoscopy;  Laterality: N/A;  1200-moved to [address] to notify pt  . COLONOSCOPY WITH PROPOFOL N/A 11/10/2017   Procedure: COLONOSCOPY WITH PROPOFOL;  Surgeon: Daneil Dolin, MD;  Location: [facility];  Service: Endoscopy;  Laterality: N/A;  . CORONARY ANGIOPLASTY  06/17/14   OM1 POBA  . CORONARY STENT PLACEMENT  01/2011   2.5x51mm Xience drug-eluting stent in ramus intermedius/OMI vessel, 2.5x35mm Promus Element stent in mid LAD artery  . ESOPHAGOGASTRODUODENOSCOPY  10/10/2008   Dr. Tilda Burrow hiatal hernia, normal esophagus, normal stomach  . ESOPHAGOGASTRODUODENOSCOPY  02/12/2004   Dr.  Dudley Major- normal   . ESOPHAGOGASTRODUODENOSCOPY N/A 05/09/2015   Procedure: ESOPHAGOGASTRODUODENOSCOPY (EGD);  Surgeon: Daneil Dolin, MD;  [facility];  Service: Endoscopy;  Laterality: N/A;  . ESOPHAGOGASTRODUODENOSCOPY (EGD) WITH PROPOFOL N/A 11/10/2017   Procedure: ESOPHAGOGASTRODUODENOSCOPY (EGD) WITH PROPOFOL;  Surgeon: Daneil Dolin, MD;  [facility]: [facility];  Service: Endoscopy;  Laterality: N/A;  9:45am  . GIVENS CAPSULE STUDY N/A 06/03/2015   Procedure: GIVENS CAPSULE STUDY;  Surgeon: Daneil Dolin, MD;  [facility]: [facility];  Service: Endoscopy;  Laterality: N/A;  0700  . HOT HEMOSTASIS  11/10/2017   Procedure: HOT HEMOSTASIS (ARGON PLASMA COAGULATION/BICAP);  Surgeon: Daneil Dolin, MD;  Location: [facility];  Service: Endoscopy;;  rectum  . KNEE SURGERY     left knee   . LEFT HEART CATHETERIZATION WITH CORONARY ANGIOGRAM N/A 06/17/2014   Procedure: LEFT HEART CATHETERIZATION WITH CORONARY ANGIOGRAM;  Surgeon: Burnell Blanks, MD;  Location: [facility] CATH LAB;  Service: Cardiovascular;  Laterality: N/A;  . PARTIAL COLECTOMY  02/29/2012   Procedure: PARTIAL COLECTOMY;  Surgeon: Adin Hector, MD;  Location: [facility];  Service: General;  Laterality: Right;  . POLYPECTOMY  11/10/2017   Procedure: POLYPECTOMY;  Surgeon: Daneil Dolin, MD;  [facility]: [facility];  Service: Endoscopy;;  gastric colon  . PROSTATECTOMY    . TONSILLECTOMY    . TRANSURETHRAL RESECTION OF PROSTATE    . VASECTOMY      There were no vitals filed for this visit.  Subjective Assessment - 05/21/19 1434    Subjective  Pt presents with AFOs this date; picked them up this morning and likes them so far.    Limitations  Walking;House hold activities    How long can you sit comfortably?  no issues    How long can you stand comfortably?  no issues; back limits    How long can you walk comfortably?  no issues when uses walking stick/SPC in community/outside    Diagnostic tests  none     Patient Stated Goals  regain balance    Currently in Pain?  No/denies           [facility] Adult PT Treatment/Exercise - 05/21/19 0001      Self-Care   Self-Care  Other Self-Care Comments    Other Self-Care Comments   donning/doffing AFOs, proper wearing schedule to increaes tolerance, monitor skin for irritation/breakdown due to decreased sensation      Knee/Hip Exercises: Standing   Gait Training  With bil AFOs donned and personal [place] practicing speed changes, forward and sidestepping on level ground, forward and side stepping over obstacles, weaving around cones 180 degree turns, 54ft x2RT each activity       PT Education - 05/21/19 1442    Education Details  Exercise technique, continue HEP, wearing AFO schedule/monitor for skin breakdown/irritation    Person(s) Educated  Patient    Methods  Explanation    Comprehension  Verbalized understanding       PT Short Term Goals - 05/01/19 1121      PT SHORT TERM GOAL #1   Title  Pt will be independent with HEP, update as needed and perform consistently.    Baseline  9/1: pt reports compliance without difficulty    Time  2    Period  Weeks    Status  Achieved    Target Date  04/20/19      PT SHORT TERM GOAL #2   Title  Pt will improve ankle muscle strength by 1/2 grade to improve ability to perform funcitonal activities and reduce risk for falls.    Baseline  9/1: see MMT    Time  2    Period  Weeks    Status  On-going      PT SHORT TERM GOAL #3   Title  Pt will maintain SLS for 15 sec to demo improved functional strength and reduce risk for falls.    Baseline  9/1: 2 sec or < bil    Time  2    Period  Weeks    Status  On-going        PT Long Term Goals - 05/01/19 1122      PT LONG TERM GOAL #1   Title  Pt will improve muscle strength by 1 grade to improve ability to perform funcitonal activities and reduce risk for falls.    Baseline  9/1: see MMT    Time  4    Period  Weeks    Status  On-going      PT LONG TERM  GOAL #2   Title  Pt will improve FGA score to >19 to reduce risk for falls and improve safety with ambulation around the community.    Baseline  9/1: 15    Time  4    Period  Weeks    Status  On-going      PT LONG TERM GOAL #3   Title  Pt will maintain SLS for 30 sec to demo improved functional strength and reduce risk for falls.    Baseline  9/1: 2 sec or < bil    Time  4    Period  Weeks    Status  On-going      PT LONG TERM GOAL #4   Title  Pt will perform 5x STS in 14.8 sec or < to indicate improve funcitonal strength and ability to move around home safely.    Baseline  9/1: 13 sec    Time  4    Period  Weeks    Status  Achieved            Plan - 05/21/19 1515    Clinical Impression Statement  Performed gait training with bil AFOs and SPC this date. Pt required moderate assist to don AFOs appropriately, but with improved strategy with repeated reps. Pt able to step over objects, side step, and perform speed changes without any toe drag or missteps. Pt without protective stepping or foot repositioning this session. Pt with same SLS tolerance with AFOs donned due to AFO not aiding in static balance. Session cut short today due to AFO wearing schedule. Pt would benefit form 1 additional treatment session to finalize HEP and f/u with gait training with AFOs.    Personal Factors and Comorbidities  Age;Comorbidity 3+;Fitness;Past/Current Experience;Time since onset of injury/illness/exacerbation    Comorbidities  see above  Examination-Activity Limitations  Locomotion Level;Transfers    Examination-Participation Restrictions  Community Activity;Yard Work    [employer]  Evolving/Moderate complexity    Rehab Potential  Good    PT Frequency  One time visit   d/c visit to finalize HEP and f/u with gait training with bil AFOs   PT Duration  --    PT Treatment/Interventions  ADLs/Self Care Home Management;Aquatic Therapy;Biofeedback;Cryotherapy;Electrical  Stimulation;Iontophoresis 4mg /ml Dexamethasone;Moist Heat;Ultrasound;DME Instruction;Gait training;Stair training;Functional mobility training;Therapeutic activities;Therapeutic exercise;Balance training;Neuromuscular re-education;Patient/family education;Orthotic Fit/Training;Manual techniques;Passive range of motion;Dry needling;Taping;Joint Manipulations    PT Next Visit Plan  Finalize HEP and gait training with AFOs.    PT Home Exercise Plan  Eval: seated heel and toe raises; 8/11: STS, SLS and tandem stance; 8/12: GTB and dorsiflexion printout; 8/21: LAQ, STS, calf raises standing, tandem stance at counter; 9/10: vector stance forward/lateral/back with 5 sec hold    Consulted and Agree with Plan of Care  Patient       Patient will benefit from skilled therapeutic intervention in order to improve the following deficits and impairments:  Abnormal gait, Decreased balance, Decreased coordination, Decreased range of motion, Decreased strength, Difficulty walking, Impaired sensation  Visit Diagnosis: Unsteadiness on feet  Other abnormalities of gait and mobility  Muscle weakness (generalized)     Problem List Patient Active Problem List   Diagnosis Date Noted  . Bilateral foot-drop 04/02/2019  . Decreased appetite 08/18/2017  . Loss of weight 08/18/2017  . Dark stools 08/18/2017  . Morbid obesity ([place]) 05/02/2017  . Vitamin D deficiency 11/26/2015  . CLL (chronic lymphocytic leukemia) ([place]) 07/21/2015  . Mucosal abnormality of esophagus   . Mucosal abnormality of stomach   . Multiple gastric polyps   . History of colon cancer   . Diverticulosis of colon without hemorrhage   . Family history of prostate cancer   . Family history of colon cancer   . Family history of stomach cancer   . Benign prostatic hyperplasia with urinary obstruction 09/12/2014  . H/O prostate cancer 08/20/2014  . Radiation proctitis 08/19/2014  . H/O agent Orange exposure 07/01/2014  . Unstable angina  ([place]) 06/16/2014  . Shingles outbreak, right anterior thoracic 01/28/2014  . Gait abnormality 12/18/2013  . Viral gastroenteritis 11/26/2013  . Dyslipidemia 02/12/2013  . Peripheral neuropathy 10/14/2012  . CAD (coronary artery disease) 10/14/2012  . HTN (hypertension) 10/14/2012  . Hx of adenomatous colonic polyps 07/26/2012  . Folic acid deficiency [id number]  . Iron deficiency 05/02/2012  . Cancer of ascending colon ([place]) 03/29/2012  . GERD 03/09/2010  . PREMATURE VENTRICULAR CONTRACTIONS 04/30/2009    Talbot Grumbling PT, DPT 05/21/19, 3:18 PM [place] [address] [place], [place], [address] Phone: [phone]   Fax:  [phone]  Name: Jaymison Densmore. MRN: [id number] Date of Birth: [birth year]

## 2019-05-24 ENCOUNTER — Encounter (HOSPITAL_COMMUNITY): Payer: Self-pay

## 2019-05-24 ENCOUNTER — Ambulatory Visit (HOSPITAL_COMMUNITY): Payer: Medicare Other

## 2019-05-24 ENCOUNTER — Other Ambulatory Visit: Payer: Self-pay

## 2019-05-24 DIAGNOSIS — M6281 Muscle weakness (generalized): Secondary | ICD-10-CM

## 2019-05-24 DIAGNOSIS — R2681 Unsteadiness on feet: Secondary | ICD-10-CM

## 2019-05-24 DIAGNOSIS — R2689 Other abnormalities of gait and mobility: Secondary | ICD-10-CM

## 2019-05-24 NOTE — Therapy (Signed)
[name] [address] [place], [place], [address] Phone: [phone]   Fax:  [phone]   PHYSICAL THERAPY DISCHARGE SUMMARY  Visits from Start of Care: 15  Current functional level related to goals / functional outcomes: See below   Remaining deficits: See below   Education / Equipment: HEP, gait training with bil AFO, donning/doffing AFO Plan: Patient agrees to discharge.  Patient goals were partially met. Patient is being discharged due to being pleased with the current functional level.  ?????       Physical Therapy Treatment  Patient Details  Name: David Pugh. MRN: [id number] Date of Birth: 09/10/1934 Referring Provider (PT): Kathrynn Ducking, MD   Encounter Date: 05/24/2019  PT End of Session - 05/24/19 1340    Visit Number  15    Number of Visits  15    Date for PT Re-Evaluation  05/04/19    Authorization Type  [employer]; Secondary: [employer] for life    Authorization Time Period  04/06/19 to 05/04/19; NEW: 05/01/19 to 05/25/19    Authorization - Visit Number  7   Reassessment compltted 05/01/19 visit 8   Authorization - Number of Visits  10    PT Start Time  1345    PT Stop Time  1405    PT Time Calculation (min)  20 min    Activity Tolerance  Patient tolerated treatment well    Behavior During Therapy  [facility] for tasks assessed/performed       Past Medical History:  Diagnosis Date  . Abnormality of gait 12/18/2013  . Arthritis   . Bilateral foot-drop 04/02/2019  . Biliary dyskinesia   . CAD (coronary artery disease) 2012   a. DES x2 in ramus/OM and mid LAD in 2012  b. balloon angioplaty of OM 2015  . CLL (chronic lymphocytic leukemia) ([place])   . Colon cancer [facility]) 2013   Right hemicolectomy, did not tolerate chemotherapy  . Colon polyps    tubular adenomas  . Diverticula of colon 2010  . Essential hypertension   . Exposure to [employer]   . Folic acid deficiency   . Gastroparesis 2014  . GERD  (gastroesophageal reflux disease)   . Glaucoma   . Heart palpitations 2014  . Hiatal hernia 2010  . History of anemia as a child   . History of kidney stones   . Iron deficiency 2013  . Myocardial infarction ([place]) 2012  . Peripheral neuropathy   . Prostate cancer [facility]) 2010   Radiation, surgery  . PTSD (post-traumatic stress disorder)   . Radiation proctitis   . Rosacea     Past Surgical History:  Procedure Laterality Date  . CARDIAC CATHETERIZATION  02/13/2011   [facility] cardiology  . CATARACT EXTRACTION     2011  . CATARACT EXTRACTION W/PHACO Right 08/06/2013   Procedure: CATARACT EXTRACTION PHACO AND INTRAOCULAR LENS PLACEMENT (IOC);  Surgeon: Tonny Branch, MD;  Location: [facility] ORS;  Service: Ophthalmology;  Laterality: Right;  CDE:20.96  . CHOLECYSTECTOMY    . COLONOSCOPY  02/12/2004   Dr. Gala Romney- L side diverticula, inflammatory  colon polyps  . COLONOSCOPY  01/19/2012   RMR: Prominent changes involving the rectal mucosa consistent with radiation-induced proctitis. Multiple colonic  polyps removed as described above. Sigmoid Diverticulosis/ 1.5 x 2 cm relatively flat ulcerated lesion in cecum/path showed adenocarcinoma of the colon. descending colon polyp with tubular adenoma and one fragment of focal high grade dysplasia.   Marland Kitchen  COLONOSCOPY  08/28/2012   LZJ:[id number] proctitis. Colonic diverticulosis/Ulcerations at the surgical anastomosis  path: ulcerated colonic mucosa with prolapse changes, tubular adenoma.   . COLONOSCOPY N/A 08/02/2013   RMR: Colonic polyps -removed as described above. Colonic diverticulosis. Status post reight hemicolectomy. Radiation proctitis- asymptomatic.   Marland Kitchen COLONOSCOPY N/A 05/09/2015   Procedure: COLONOSCOPY;  Surgeon: Daneil Dolin, MD;  Location: [facility];  Service: Endoscopy;  Laterality: N/A;  1200-moved to [address]5 [address] to notify pt  . COLONOSCOPY WITH PROPOFOL N/A 11/10/2017   Procedure: COLONOSCOPY WITH PROPOFOL;  Surgeon: Daneil Dolin,  MD;  Location: [facility];  Service: Endoscopy;  Laterality: N/A;  . CORONARY ANGIOPLASTY  06/17/14   OM1 POBA  . CORONARY STENT PLACEMENT  01/2011   2.5x58m Xience drug-eluting stent in ramus intermedius/OMI vessel, 2.5x627mPromus Element stent in mid LAD artery  . ESOPHAGOGASTRODUODENOSCOPY  10/10/2008   Dr. RoTilda Burrowiatal hernia, normal esophagus, normal stomach  . ESOPHAGOGASTRODUODENOSCOPY  02/12/2004   Dr. RoDudley Majornormal   . ESOPHAGOGASTRODUODENOSCOPY N/A 05/09/2015   Procedure: ESOPHAGOGASTRODUODENOSCOPY (EGD);  Surgeon: RoDaneil DolinMD;  [facility];  Service: Endoscopy;  Laterality: N/A;  . ESOPHAGOGASTRODUODENOSCOPY (EGD) WITH PROPOFOL N/A 11/10/2017   Procedure: ESOPHAGOGASTRODUODENOSCOPY (EGD) WITH PROPOFOL;  Surgeon: RoDaneil DolinMD;  Location: [facility];  Service: Endoscopy;  Laterality: N/A;  9:45am  . GIVENS CAPSULE STUDY N/A 06/03/2015   Procedure: GIVENS CAPSULE STUDY;  Surgeon: RoDaneil DolinMD;  Location: [facility];  Service: Endoscopy;  Laterality: N/A;  0700  . HOT HEMOSTASIS  11/10/2017   Procedure: HOT HEMOSTASIS (ARGON PLASMA COAGULATION/BICAP);  Surgeon: RoDaneil DolinMD;  Location: [facility];  Service: Endoscopy;;  rectum  . KNEE SURGERY     left knee   . LEFT HEART CATHETERIZATION WITH CORONARY ANGIOGRAM N/A 06/17/2014   Procedure: LEFT HEART CATHETERIZATION WITH CORONARY ANGIOGRAM;  Surgeon: ChBurnell BlanksMD;  [facility];  Service: Cardiovascular;  Laterality: N/A;  . PARTIAL COLECTOMY  02/29/2012   Procedure: PARTIAL COLECTOMY;  Surgeon: HaAdin HectorMD;  Location: [facility];  Service: General;  Laterality: Right;  . POLYPECTOMY  11/10/2017   Procedure: POLYPECTOMY;  Surgeon: RoDaneil DolinMD;  Location: [facility];  Service: Endoscopy;;  gastric colon  . PROSTATECTOMY    . TONSILLECTOMY    . TRANSURETHRAL RESECTION OF PROSTATE    . VASECTOMY      There were no vitals filed for this  visit.  Subjective Assessment - 05/24/19 1339    Subjective  Pt reports he pulled his knee going up/down stairs today. Pt presents with AFOs donned and is happy to d/c from therapy due to progress since evaluation.    Limitations  Walking;House hold activities    How long can you sit comfortably?  no issues    How long can you stand comfortably?  no issues; back limits    How long can you walk comfortably?  no issues when uses walking stick/SPC in community/outside    Diagnostic tests  none    Patient Stated Goals  regain balance    Currently in Pain?  No/denies         [facility] 05/24/19 0001      Assessment   Medical Diagnosis  idiopathic PN, gait abnomality, bil foot drop    Referring Provider (PT)  ChKathrynn DuckingMD    Onset Date/Surgical Date  --   declining  over 12 years   Next MD Visit  f/u Feb 2021    Prior Therapy  Yes, for balance but without dropfoot diagnosis      Strength   Overall Strength Comments  AFOs donned, ankle strength not tested    Strength Assessment Site  Hip;Knee;Ankle    Right Hip Flexion  5/5   4+   Right Hip Extension  4/5   4   Right Hip ABduction  4+/5   4+   Left Hip Flexion  5/5   5   Left Hip Extension  4/5   4-   Left Hip ABduction  4+/5   4+   Right Knee Flexion  5/5   4   Right Knee Extension  5/5   5   Left Knee Flexion  5/5   4   Left Knee Extension  5/5   5   Right Ankle Dorsiflexion  3+/5   3+   Right Ankle Plantar Flexion  --    Right Ankle Inversion  --    Right Ankle Eversion  --    Left Ankle Dorsiflexion  3+/5   3+   Left Ankle Plantar Flexion  --    Left Ankle Inversion  --    Left Ankle Eversion  --      Balance   Balance Assessed  Yes      Static Standing Balance   Static Standing - Balance Support  No upper extremity supported    Static Standing Balance -  Activities   Single Leg Stance - Right Leg;Single Leg Stance - Left Leg    Static Standing - Comment/# of Minutes  R: 3 sec or <, L: 4  sec or <   was 2 sec or < bilaterally     Standardized Balance Assessment   Standardized Balance Assessment  Five Times Sit to Stand    Five times sit to stand comments   13.5 sec, from chair, no UE   was 13 sec     Functional Gait  Assessment   Gait assessed   Yes    Gait Level Surface  Walks 20 ft in less than 5.5 sec, no assistive devices, good speed, no evidence for imbalance, normal gait pattern, deviates no more than 6 in outside of the 12 in walkway width.    Change in Gait Speed  Able to change speed, demonstrates mild gait deviations, deviates 6-10 in outside of the 12 in walkway width, or no gait deviations, unable to achieve a major change in velocity, or uses a change in velocity, or uses an assistive device.    Gait with Horizontal Head Turns  Performs head turns smoothly with slight change in gait velocity (eg, minor disruption to smooth gait path), deviates 6-10 in outside 12 in walkway width, or uses an assistive device.    Gait with Vertical Head Turns  Performs task with slight change in gait velocity (eg, minor disruption to smooth gait path), deviates 6 - 10 in outside 12 in walkway width or uses assistive device    Gait and Pivot Turn  Pivot turns safely in greater than 3 sec and stops with no loss of balance, or pivot turns safely within 3 sec and stops with mild imbalance, requires small steps to catch balance.    Step Over Obstacle  Is able to step over one shoe box (4.5 in total height) but must slow down and adjust steps to clear box safely. May require verbal cueing.  Gait with Narrow Base of Support  Ambulates less than 4 steps heel to toe or cannot perform without assistance.    Gait with Eyes Closed  Walks 20 ft, slow speed, abnormal gait pattern, evidence for imbalance, deviates 10-15 in outside 12 in walkway width. Requires more than 9 sec to ambulate 20 ft.    Ambulating Backwards  Walks 20 ft, uses assistive device, slower speed, mild gait deviations, deviates  6-10 in outside 12 in walkway width.    Steps  Alternating feet, must use rail.    Total Score  17                   [facility] Adult PT Treatment/Exercise - 05/24/19 0001      Ambulation/Gait   Gait Comments  gait training in parking lot, uneven paved surface, sloping surfaces up/down, and navigating curb without SPC; no unsteadiness noted             PT Education - 05/24/19 1339    Education Details  d/c to HEP    Person(s) Educated  Patient    Methods  Explanation    Comprehension  Verbalized understanding       PT Short Term Goals - 05/24/19 1414      PT SHORT TERM GOAL #1   Title  Pt will be independent with HEP, update as needed and perform consistently.    Baseline  9/1: pt reports compliance without difficulty    Time  2    Period  Weeks    Status  Achieved    Target Date  04/20/19      PT SHORT TERM GOAL #2   Title  Pt will improve ankle muscle strength by 1/2 grade to improve ability to perform funcitonal activities and reduce risk for falls.    Baseline  9/1: see MMT; 9/24: no change in ankle DF    Time  2    Period  Weeks    Status  Not Met      PT SHORT TERM GOAL #3   Title  Pt will maintain SLS for 15 sec to demo improved functional strength and reduce risk for falls.    Baseline  9/1: 2 sec or < bil; 9/24: R: 3 sec or <, L: 4 sec or <    Time  2    Period  Weeks    Status  Not Met        PT Long Term Goals - 05/24/19 1414      PT LONG TERM GOAL #1   Title  Pt will improve muscle strength by 1 grade to improve ability to perform funcitonal activities and reduce risk for falls.    Baseline  9/1: see MMT; 9/24: see MMT    Time  4    Period  Weeks    Status  Partially Met      PT LONG TERM GOAL #2   Title  Pt will improve FGA score to >19 to reduce risk for falls and improve safety with ambulation around the community.    Baseline  9/1: 15; 9/24: 17/24    Time  4    Period  Weeks    Status  Not Met      PT LONG TERM GOAL #3    Title  Pt will maintain SLS for 30 sec to demo improved functional strength and reduce risk for falls.    Baseline  9/1: 2 sec or < bil; 9/24R: 3  sec or <, L: 4 sec or <    Time  4    Period  Weeks    Status  On-going      PT LONG TERM GOAL #4   Title  Pt will perform 5x STS in 14.8 sec or < to indicate improve funcitonal strength and ability to move around home safely.    Baseline  9/1: 13 sec; 9/24: 13.5 sec    Time  4    Period  Weeks    Status  Achieved            Plan - 05/24/19 1340    Clinical Impression Statement  Pt reports for finals session with AFOs donned for gait training. Pt with improvement in FGA to 17 with AFOs donned and continued to be limited in heel-toe gait, backwards gait and eyes closed. Pt with improvements in speed changes and clearing over obstacles. Pt independent and motivated to continue HEP at home and with new AFOs in order to maintain balance with gait and other dynamic activities. Pt with significant gait training, able to ambulate without veering in variety of surface without unsteadiness noted with AFOs and with and without SPC. Pt is appropriate to d/c this date due to being pleased with current functional status and able to continue progressing at home.    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  Evolving/Moderate complexity    Rehab Potential  Good    PT Frequency  One time visit   d/c visit to finalize HEP and f/u with gait training with bil AFOs   PT Treatment/Interventions  ADLs/Self Care Home Management;Aquatic Therapy;Biofeedback;Cryotherapy;Electrical Stimulation;Iontophoresis 51m/ml Dexamethasone;Moist Heat;Ultrasound;DME Instruction;Gait  training;Stair training;Functional mobility training;Therapeutic activities;Therapeutic exercise;Balance training;Neuromuscular re-education;Patient/family education;Orthotic Fit/Training;Manual techniques;Passive range of motion;Dry needling;Taping;Joint Manipulations    PT Next Visit Plan  d/c to HEP    PT Home Exercise Plan  Eval: seated heel and toe raises; 8/11: STS, SLS and tandem stance; 8/12: GTB and dorsiflexion printout; 8/21: LAQ, STS, calf raises standing, tandem stance at counter; 9/10: vector stance forward/lateral/back with 5 sec hold    Consulted and Agree with Plan of Care  Patient       Patient will benefit from skilled therapeutic intervention in order to improve the following deficits and impairments:  Abnormal gait, Decreased balance, Decreased coordination, Decreased range of motion, Decreased strength, Difficulty walking, Impaired sensation  Visit Diagnosis: Unsteadiness on feet  Other abnormalities of gait and mobility  Muscle weakness (generalized)     Problem List Patient Active Problem List   Diagnosis Date Noted  . Bilateral foot-drop 04/02/2019  . Decreased appetite 08/18/2017  . Loss of weight 08/18/2017  . Dark stools 08/18/2017  . [place]bid obesity [place] 05/02/2017  . Vitamin D deficiency 11/26/2015  . CLL [place] lymphocytic [place]) [place] 07/21/2015  . Mucosal abnormality of esophagus   . Mucosal abnormality of stomach   . Multiple gastric polyps   . History of colon cancer   . Diverticulosis of colon without hemorrhage   . Family history of prostate cancer   . Family history of colon cancer   . Family history of stomach cancer   . Benign prostatic hyperplasia with urinary obstruction 09/12/2014  . H/O prostate cancer 08/20/2014  . Radiation proctitis 08/19/2014  . H/O agent Orange  exposure 07/01/2014  . Unstable angina ([place]) 06/16/2014  . Shingles outbreak, right anterior thoracic 01/28/2014  . Gait abnormality 12/18/2013  . Viral  gastroenteritis 11/26/2013  . Dyslipidemia 02/12/2013  . Peripheral neuropathy 10/14/2012  . CAD (coronary artery disease) 10/14/2012  . HTN (hypertension) 10/14/2012  . Hx of adenomatous colonic polyps 07/26/2012  . Folic acid deficiency 18/29/9371  . Iron deficiency 05/02/2012  . Cancer of ascending colon ([place]) 03/29/2012  . GERD 03/09/2010  . PREMATURE VENTRICULAR CONTRACTIONS 04/30/2009     Talbot Grumbling PT, DPT 05/24/19, 2:22 PM [place] [address] [place], [place], [address] Phone: [phone]   Fax:  [phone]  Name: Singleton Hickox. MRN: [id number] Date of Birth: 1934-09-08

## 2019-06-08 ENCOUNTER — Other Ambulatory Visit: Payer: Self-pay

## 2019-06-08 ENCOUNTER — Ambulatory Visit (INDEPENDENT_AMBULATORY_CARE_PROVIDER_SITE_OTHER): Payer: Medicare Other | Admitting: Internal Medicine

## 2019-06-08 ENCOUNTER — Encounter: Payer: Self-pay | Admitting: Internal Medicine

## 2019-06-08 VITALS — BP 134/70 | HR 57 | Temp 97.2°F | Ht 70.0 in | Wt 220.6 lb

## 2019-06-08 DIAGNOSIS — K5909 Other constipation: Secondary | ICD-10-CM | POA: Diagnosis not present

## 2019-06-08 NOTE — Progress Notes (Signed)
Primary Care Physician:  Sandi Mealy, MD Primary Gastroenterologist:  Dr. Gala Romney  Pre-Procedure History & Physical: HPI:  David Pugh. is a 84 y.o. male here for 84 year old gentleman with a history of colon cancer, GERD, radiation proctitis and constipation is here for follow-up visit.  Doing very well.  GERD well-controlled on Protonix 40 mg daily no dysphagia.  No rectal bleeding.  He is chronically constipated utilizing a fiber supplement.  Colonoscopy last year demonstrated radiation proctitis-treated with APC and a single adenoma removed along with diverticulosis.  No future colonoscopy recommended.  EGD revealed benign gastric polyps no evidence of H. pylori or malignancy.  Past Medical History:  Diagnosis Date  . Abnormality of gait 12/18/2013  . Arthritis   . Bilateral foot-drop 04/02/2019  . Biliary dyskinesia   . CAD (coronary artery disease) 2012   a. DES x2 in ramus/OM and mid LAD in 2012  b. balloon angioplaty of OM 2015  . CLL (chronic lymphocytic leukemia) ([place])   . Colon cancer [facility]) 2013   Right hemicolectomy, did not tolerate chemotherapy  . Colon polyps    tubular adenomas  . Diverticula of colon 2010  . Essential hypertension   . Exposure to [employer]   . Folic acid deficiency   . Gastroparesis 2014  . GERD (gastroesophageal reflux disease)   . Glaucoma   . Heart palpitations 2014  . Hiatal hernia 2010  . History of anemia as a child   . History of kidney stones   . Iron deficiency 2013  . Myocardial infarction ([place]) 2012  . Peripheral neuropathy   . Prostate cancer [facility]) 2010   Radiation, surgery  . PTSD (post-traumatic stress disorder)   . Radiation proctitis   . Rosacea     Past Surgical History:  Procedure Laterality Date  . CARDIAC CATHETERIZATION  02/13/2011   [facility] cardiology  . CATARACT EXTRACTION     2011  . CATARACT EXTRACTION W/PHACO Right 08/06/2013   Procedure: CATARACT EXTRACTION PHACO AND INTRAOCULAR  LENS PLACEMENT (IOC);  Surgeon: Tonny Branch, MD;  Location: [facility] ORS;  Service: Ophthalmology;  Laterality: Right;  CDE:20.96  . CHOLECYSTECTOMY    . COLONOSCOPY  02/12/2004   Dr. Gala Romney- L side diverticula, inflammatory  colon polyps  . COLONOSCOPY  01/19/2012   RMR: Prominent changes involving the rectal mucosa consistent with radiation-induced proctitis. Multiple colonic  polyps removed as described above. Sigmoid Diverticulosis/ 1.5 x 2 cm relatively flat ulcerated lesion in cecum/path showed adenocarcinoma of the colon. descending colon polyp with tubular adenoma and one fragment of focal high grade dysplasia.   . COLONOSCOPY  08/28/2012   [id number] proctitis. Colonic diverticulosis/Ulcerations at the surgical anastomosis  path: ulcerated colonic mucosa with prolapse changes, tubular adenoma.   . COLONOSCOPY N/A 08/02/2013   RMR: Colonic polyps -removed as described above. Colonic diverticulosis. Status post reight hemicolectomy. Radiation proctitis- asymptomatic.   Marland Kitchen COLONOSCOPY N/A 05/09/2015   Procedure: COLONOSCOPY;  Surgeon: Daneil Dolin, MD;  Location: [facility];  Service: Endoscopy;  Laterality: N/A;  1200-moved to [address]5 [address] to notify pt  . COLONOSCOPY WITH PROPOFOL N/A 11/10/2017   Procedure: COLONOSCOPY WITH PROPOFOL;  Surgeon: Daneil Dolin, MD;  Location: [facility];  Service: Endoscopy;  Laterality: N/A;  . CORONARY ANGIOPLASTY  06/17/14   OM1 POBA  . CORONARY STENT PLACEMENT  01/2011   2.5x19mm Xience drug-eluting stent in ramus intermedius/OMI vessel, 2.5x58mm Promus Element stent in mid LAD artery  .  ESOPHAGOGASTRODUODENOSCOPY  10/10/2008   Dr. Tilda Burrow hiatal hernia, normal esophagus, normal stomach  . ESOPHAGOGASTRODUODENOSCOPY  02/12/2004   Dr. Dudley Major- normal   . ESOPHAGOGASTRODUODENOSCOPY N/A 05/09/2015   Procedure: ESOPHAGOGASTRODUODENOSCOPY (EGD);  Surgeon: Daneil Dolin, MD;  [facility];  Service: Endoscopy;  Laterality: N/A;  .  ESOPHAGOGASTRODUODENOSCOPY (EGD) WITH PROPOFOL N/A 11/10/2017   Procedure: ESOPHAGOGASTRODUODENOSCOPY (EGD) WITH PROPOFOL;  Surgeon: Daneil Dolin, MD;  [facility]: [facility];  Service: Endoscopy;  Laterality: N/A;  9:45am  . GIVENS CAPSULE STUDY N/A 06/03/2015   Procedure: GIVENS CAPSULE STUDY;  Surgeon: Daneil Dolin, MD;  Location: [facility];  Service: Endoscopy;  Laterality: N/A;  0700  . HOT HEMOSTASIS  11/10/2017   Procedure: HOT HEMOSTASIS (ARGON PLASMA COAGULATION/BICAP);  Surgeon: Daneil Dolin, MD;  Location: [facility];  Service: Endoscopy;;  rectum  . KNEE SURGERY     left knee   . LEFT HEART CATHETERIZATION WITH CORONARY ANGIOGRAM N/A 06/17/2014   Procedure: LEFT HEART CATHETERIZATION WITH CORONARY ANGIOGRAM;  Surgeon: Burnell Blanks, MD;  Location: [facility] CATH LAB;  Service: Cardiovascular;  Laterality: N/A;  . PARTIAL COLECTOMY  02/29/2012   Procedure: PARTIAL COLECTOMY;  Surgeon: Adin Hector, MD;  Location: [facility];  Service: General;  Laterality: Right;  . POLYPECTOMY  11/10/2017   Procedure: POLYPECTOMY;  Surgeon: Daneil Dolin, MD;  Location[facility];  Service: Endoscopy;;  gastric colon  . PROSTATECTOMY    . TONSILLECTOMY    . TRANSURETHRAL RESECTION OF PROSTATE    . VASECTOMY      Prior to Admission medications   Medication Sig Start Date End Date Taking? Authorizing Provider  ALPRAZolam Duanne Moron) 0.5 MG tablet Take 0.5 mg by mouth daily as needed for anxiety.   Yes [provider]  aspirin EC 81 MG tablet Take 81 mg by mouth daily.   Yes [provider]  B Complex Vitamins (VITAMIN B COMPLEX PO) Take 1 tablet by mouth at bedtime.    Yes [provider]  cholecalciferol (VITAMIN D) 400 units TABS tablet Take 400 Units by mouth daily.   Yes [provider]  citalopram (CELEXA) 20 MG tablet Take 10 mg by mouth daily.    Yes [provider]  clopidogrel (PLAVIX) 75 MG tablet TAKE (1) TABLET BY MOUTH ONCE  DAILY. 07/03/18  Yes Croitoru, Mihai, MD  folic acid (FOLVITE) 1 MG tablet TAKE (1) TABLET BY MOUTH ONCE DAILY . Patient taking differently: Take 1 mg by mouth once a week.  07/13/17  Yes Holley Bouche, NP  hydrocortisone (ANUSOL-HC) 2.5 % rectal cream Place 1 application rectally 3 (three) times daily. Patient taking differently: Place 1 application rectally as needed.  11/25/17  Yes Bennette Hasty, Cristopher Estimable, MD  ibuprofen (ADVIL,MOTRIN) 200 MG tablet Take 400 mg by mouth daily as needed for headache or moderate pain.   Yes [provider]  latanoprost (XALATAN) 0.005 % ophthalmic solution Place 1 drop into both eyes at bedtime.    Yes [provider]  loperamide (IMODIUM) 2 MG capsule Take 2 mg by mouth as needed for diarrhea or loose stools.   Yes [provider]  losartan (COZAAR) 50 MG tablet Take 25 mg by mouth 2 (two) times daily.    Yes [provider]  metoprolol (LOPRESSOR) 50 MG tablet Take 50 mg by mouth 2 (two) times daily.    Yes [provider]  montelukast (SINGULAIR) 10 MG tablet Take 10 mg  by mouth at bedtime.   Yes [provider]  Multiple Vitamin (MULTIVITAMIN WITH MINERALS) TABS tablet Take 1 tablet by mouth daily.   Yes [provider]  nitroGLYCERIN (NITROSTAT) 0.4 MG SL tablet PLACE ONE (1) TABLET UNDER TONGUE EVERY 5 MINUTES UP TO (3) DOSES AS NEEDED FOR CHEST PAIN. 11/15/18  Yes Croitoru, Mihai, MD  ofloxacin (OCUFLOX) 0.3 % ophthalmic solution Place 1 drop into both eyes as needed.    Yes [provider]  pantoprazole (PROTONIX) 40 MG tablet Take 40 mg by mouth daily.   Yes [provider]  pravastatin (PRAVACHOL) 40 MG tablet TAKE 1 TABLET BY MOUTH AT BEDTIME. 04/02/19  Yes Satira Sark, MD  vitamin B-12 (CYANOCOBALAMIN) 1000 MCG tablet Take by mouth.   Yes [provider]  Wheat Dextrin (BENEFIBER) POWD Take by mouth as needed. Daily in the AM    Yes [provider]     Allergies as of 06/08/2019 - Review Complete 06/08/2019  Allergen Reaction Noted  . Shellfish allergy Anaphylaxis 01/13/2012  . Sulfonamide derivatives Diarrhea and Nausea Only 04/30/2009    Family History  Problem Relation Age of Onset  . Throat cancer Father        dx in his 43s and again in his 53s; pipe and cigar smoker  . Neuropathy Brother   . Prostate cancer Brother 67  . Melanoma Brother        dx in his 76s  . Cancer Paternal Uncle        smoking related cancer  . Heart attack Paternal Grandfather   . Stomach cancer Cousin 20  . Colon cancer Neg Hx     Social History   Socioeconomic History  . Marital status: Married    Spouse name: Not on file  . Number of children: 3  . 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 Highest education level: Not on file  Occupational History  . 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 Financial resource strain: Not on file  . Food insecurity    Worry: Not on file    Inability: Not on file  . Transportation needs    Medical: Not on file    Non-medical: Not on file  Tobacco Use  . Smoking status: Never Smoker  . Smokeless tobacco: Never Used  Substance and Sexual Activity  . Alcohol use: Yes    Comment: 1 cocktail/month  . Drug use: No  . Sexual activity: Yes    Birth control/protection: None  Lifestyle  . Physical activity    Days per week: Not on file    Minutes per session: Not on file  . Stress: Not on file  Relationships  . Social Herbalist on phone: Not on file    Gets together: Not on file    Attends religious service: Not on file    Active member of club or organization: Not on file    Attends meetings of clubs or organizations: Not on file    Relationship status: Not on file  . Intimate partner violence    Fear of current or ex partner: Not on file    Emotionally abused: Not on file    Physically abused: Not on file    Forced sexual activity: Not on file  Other Topics Concern  . Not on  file  Social History Narrative   Right handed   Caffeine~1 cup per day    Lives at home  with wife     Review of Systems: See HPI, otherwise negative ROS  Physical Exam: BP 134/70   Pulse (!) 57   Temp (!) 97.2 F (36.2 C) (Temporal)   Ht 5\' 10"  (1.778 m)   Wt 220 lb 9.6 oz (100.1 kg)   BMI 31.65 kg/m  General:   Alert,   pleasant and cooperative in NAD Lungs:  Clear throughout to auscultation.   No wheezes, crackles, or rhonchi. No acute distress. Heart:  Regular rate and rhythm; no murmurs, clicks, rubs,  or gallops. Abdomen: Non-distended, normal bowel sounds.  Soft and nontender without appreciable mass or hepatosplenomegaly.  Pulses:  Normal pulses noted. Extremities:  Without clubbing or edema.  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   GERD doing well on Protonix 40 mg a daily.  History of colon cancer  - status post right hemicolectomy with essentially negative colonoscopy 1 year ago.  Radiation proctitis successfully treated. Chronic constipation needs further intervention.  Not much mileage with fiber supplementation these days.   Recommendations:  Continue Protonix 40 mg daily  May stop using fiber supplements for now  Use Miralax 17 grams orally at bedtime on any day with no bowel movement   No future colonoscopy unless new symptoms develop.  OV here in 6 months    Notice: This dictation was prepared with Dragon dictation along with smaller phrase technology. Any transcriptional errors that result from this process are unintentional and may not be corrected upon review.

## 2019-06-08 NOTE — Patient Instructions (Signed)
Continue Protonix 40 mg daily  May stop using fiber for now  Use Miralax 17 grams orally at bedtime on any day with no bowel movement   OV here in 6 months

## 2019-06-18 ENCOUNTER — Other Ambulatory Visit: Payer: Self-pay | Admitting: Cardiovascular Disease

## 2019-06-20 ENCOUNTER — Inpatient Hospital Stay (HOSPITAL_COMMUNITY): Payer: Medicare Other | Attending: Hematology

## 2019-06-20 ENCOUNTER — Other Ambulatory Visit: Payer: Self-pay

## 2019-06-20 DIAGNOSIS — I10 Essential (primary) hypertension: Secondary | ICD-10-CM | POA: Insufficient documentation

## 2019-06-20 DIAGNOSIS — Z79899 Other long term (current) drug therapy: Secondary | ICD-10-CM | POA: Diagnosis not present

## 2019-06-20 DIAGNOSIS — Z7982 Long term (current) use of aspirin: Secondary | ICD-10-CM | POA: Insufficient documentation

## 2019-06-20 DIAGNOSIS — Z9221 Personal history of antineoplastic chemotherapy: Secondary | ICD-10-CM | POA: Diagnosis not present

## 2019-06-20 DIAGNOSIS — Z791 Long term (current) use of non-steroidal anti-inflammatories (NSAID): Secondary | ICD-10-CM | POA: Insufficient documentation

## 2019-06-20 DIAGNOSIS — E785 Hyperlipidemia, unspecified: Secondary | ICD-10-CM | POA: Diagnosis not present

## 2019-06-20 DIAGNOSIS — K219 Gastro-esophageal reflux disease without esophagitis: Secondary | ICD-10-CM | POA: Diagnosis not present

## 2019-06-20 DIAGNOSIS — Z8546 Personal history of malignant neoplasm of prostate: Secondary | ICD-10-CM | POA: Diagnosis not present

## 2019-06-20 DIAGNOSIS — C9110 Chronic lymphocytic leukemia of B-cell type not having achieved remission: Secondary | ICD-10-CM | POA: Diagnosis not present

## 2019-06-20 DIAGNOSIS — Z8249 Family history of ischemic heart disease and other diseases of the circulatory system: Secondary | ICD-10-CM | POA: Diagnosis not present

## 2019-06-20 DIAGNOSIS — Z923 Personal history of irradiation: Secondary | ICD-10-CM | POA: Insufficient documentation

## 2019-06-20 DIAGNOSIS — Z85038 Personal history of other malignant neoplasm of large intestine: Secondary | ICD-10-CM | POA: Diagnosis not present

## 2019-06-20 LAB — COMPREHENSIVE METABOLIC PANEL
ALT: 18 U/L (ref 0–44)
AST: 20 U/L (ref 15–41)
Albumin: 3.8 g/dL (ref 3.5–5.0)
Alkaline Phosphatase: 56 U/L (ref 38–126)
Anion gap: 8 (ref 5–15)
BUN: 13 mg/dL (ref 8–23)
CO2: 26 mmol/L (ref 22–32)
Calcium: 8.8 mg/dL — ABNORMAL LOW (ref 8.9–10.3)
Chloride: 109 mmol/L (ref 98–111)
Creatinine, Ser: 1.13 mg/dL (ref 0.61–1.24)
GFR calc Af Amer: >60 mL/min (ref >60)
GFR calc non Af Amer: >60 mL/min (ref >60)
Glucose, Bld: 101 mg/dL — ABNORMAL HIGH (ref 70–99)
Potassium: 4.2 mmol/L (ref 3.5–5.1)
Sodium: 143 mmol/L (ref 135–145)
Total Bilirubin: 1.4 mg/dL — ABNORMAL HIGH (ref 0.3–1.2)
Total Protein: 6.3 g/dL — ABNORMAL LOW (ref 6.5–8.1)

## 2019-06-20 LAB — CBC WITH DIFFERENTIAL/PLATELET
Abs Immature Granulocytes: 0.06 10*3/uL (ref 0.00–0.07)
Basophils Absolute: 0.1 10*3/uL (ref 0.0–0.1)
Basophils Relative: 1 %
Eosinophils Absolute: 0.5 10*3/uL (ref 0.0–0.5)
Eosinophils Relative: 4 %
HCT: 45.1 % (ref 39.0–52.0)
Hemoglobin: 14.6 g/dL (ref 13.0–17.0)
Immature Granulocytes: 1 %
Lymphocytes Relative: 42 %
Lymphs Abs: 5.6 10*3/uL — ABNORMAL HIGH (ref 0.7–4.0)
MCH: 31.1 pg (ref 26.0–34.0)
MCHC: 32.4 g/dL (ref 30.0–36.0)
MCV: 96.2 fL (ref 80.0–100.0)
Monocytes Absolute: 1 10*3/uL (ref 0.1–1.0)
Monocytes Relative: 8 %
Neutro Abs: 5.6 10*3/uL (ref 1.7–7.7)
Neutrophils Relative %: 44 %
Platelets: 387 10*3/uL (ref 150–400)
RBC: 4.69 MIL/uL (ref 4.22–5.81)
RDW: 12.7 % (ref 11.5–15.5)
WBC: 12.8 10*3/uL — ABNORMAL HIGH (ref 4.0–10.5)
nRBC: 0 % (ref 0.0–0.2)

## 2019-06-20 LAB — LACTATE DEHYDROGENASE: LDH: 126 U/L (ref 98–192)

## 2019-06-20 LAB — VITAMIN B12: Vitamin B-12: 468 pg/mL (ref 180–914)

## 2019-06-20 LAB — VITAMIN D 25 HYDROXY (VIT D DEFICIENCY, FRACTURES): Vit D, 25-Hydroxy: 32.34 ng/mL (ref 30–100)

## 2019-06-20 LAB — PSA: Prostatic Specific Antigen: 0.11 ng/mL (ref 0.00–4.00)

## 2019-06-21 LAB — CEA: CEA: 2 ng/mL (ref 0.0–4.7)

## 2019-06-22 ENCOUNTER — Other Ambulatory Visit: Payer: Self-pay

## 2019-06-22 DIAGNOSIS — Z20822 Contact with and (suspected) exposure to covid-19: Secondary | ICD-10-CM

## 2019-06-24 LAB — NOVEL CORONAVIRUS, NAA: SARS-CoV-2, NAA: NOT DETECTED

## 2019-06-27 ENCOUNTER — Ambulatory Visit (HOSPITAL_COMMUNITY): Payer: Medicare Other | Admitting: Nurse Practitioner

## 2019-06-27 ENCOUNTER — Other Ambulatory Visit: Payer: Self-pay

## 2019-06-28 ENCOUNTER — Inpatient Hospital Stay (HOSPITAL_BASED_OUTPATIENT_CLINIC_OR_DEPARTMENT_OTHER): Payer: Medicare Other | Admitting: Hematology

## 2019-06-28 DIAGNOSIS — C9110 Chronic lymphocytic leukemia of B-cell type not having achieved remission: Secondary | ICD-10-CM | POA: Diagnosis not present

## 2019-06-28 NOTE — Assessment & Plan Note (Signed)
1.  Stage 0 CLL: - He was diagnosed by flow on 12/13/2014 - Today's examination did not reveal any palpable adenopathy or splenomegaly. -Denies any fevers, chills, night sweats.  No weight loss.  No lymphadenopathy. -Labs are stable white blood cell count of 12.8, ANC 5.6, lymphocytes 5.6, hemoglobin 14.6 and platelets 387,000. -No indication for treatment at this time.  We will continue to monitor labs. -Patient return to clinic in 6 months.   2.  Stage III ascending colon cancer: - He had 2 out of 20 lymph nodes positive. -Status post right hemicolectomy on 02/29/2012, could not tolerate adjuvant Xeloda. - EGD showed multiple benign gastric polyps, otherwise within normal limits. -He is completed 5 years of surveillance.  No more scans needed at this time. -He had a colonoscopy on 11/10/2017 which showed radiation proctitis changes.  Diverticulosis in the sigmoid colon.  One polyp in the ascending colon which was removed -CEA is within normal. - We will continue to follow his CEA levels and do scans if disease warrants.  3.  Prostate cancer: -Underwent radiation therapy at [facility] in 2011. -He also underwent a TURP x2. -He follows up with Dr. Nevada Crane his urologist in [facility]. -PSA levels are within normal. -We will continue to monitor PSA levels.

## 2019-06-28 NOTE — Progress Notes (Signed)
[place], [place] [address]   CLINIC:  Medical Oncology/Hematology  PCP:  Sandi Mealy, MD [place] [address] [phone]   REASON FOR VISIT:  Follow-up for CLL  CURRENT THERAPY: Clinical surveillance  BRIEF ONCOLOGIC HISTORY:  Oncology History  Cancer of ascending colon ([place])  02/29/2012 Procedure   laparoscopic assisted R colectomy   03/09/2012 Miscellaneous   Iron deficiency and folic acid deficiency   03/29/2012 Initial Diagnosis   Cancer of ascending colon, CEA on 01/19/2012 was WNL   08/02/2013 Procedure   Ileocolonoscopy with snare polypectomy   06/11/2014 Imaging   CT abdomen/pelvis with no findings to suggest metastatic disease. Diverticulosis   06/11/2016 Imaging   CT Abdomen/Pelvis with no findings to suggest recurrent or metastatic disease. Prostatomegaly.       CANCER STAGING: Cancer Staging Cancer of ascending colon ([place]) Staging form: Colon and Rectum, AJCC 7th Edition - Clinical: Stage IIIB (T3, N1c, M0) - Signed by Baird Cancer, PA on 05/02/2012    INTERVAL HISTORY:  David Pugh 84 y.o. male presents today for follow-up.  He reports overall doing well.  He he denies any significant fatigue.  Denies any change in bowel habits.  Appetite is stable.  No weight loss.  Denies any fevers, chills, night sweats.  No lymphadenopathy.  Denies any changes to his health history since his last visit.  He is here for repeat labs and follow-up.    REVIEW OF SYSTEMS:  Review of Systems  Constitutional: Negative.   HENT:  Negative.   Eyes: Negative.   Respiratory: Negative.   Cardiovascular: Negative.   Gastrointestinal: Negative.   Genitourinary: Negative.    Musculoskeletal: Positive for arthralgias and gait problem.  Skin: Negative.   Neurological: Positive for extremity weakness and gait problem.  Hematological: Negative.   Psychiatric/Behavioral: Negative.      PAST  MEDICAL/SURGICAL HISTORY:  Past Medical History:  Diagnosis Date  . Abnormality of gait 12/18/2013  . Arthritis   . Bilateral foot-drop 04/02/2019  . Biliary dyskinesia   . CAD (coronary artery disease) 2012   a. DES x2 in ramus/OM and mid LAD in 2012  b. balloon angioplaty of OM 2015  . CLL (chronic lymphocytic leukemia) ([place])   . Colon cancer [facility]) 2013   Right hemicolectomy, did not tolerate chemotherapy  . Colon polyps    tubular adenomas  . Diverticula of colon 2010  . Essential hypertension   . Exposure to [employer]   . Folic acid deficiency   . Gastroparesis 2014  . GERD (gastroesophageal reflux disease)   . Glaucoma   . Heart palpitations 2014  . Hiatal hernia 2010  . History of anemia as a child   . History of kidney stones   . Iron deficiency 2013  . Myocardial infarction ([place]) 2012  . Peripheral neuropathy   . Prostate cancer [facility]) 2010   Radiation, surgery  . PTSD (post-traumatic stress disorder)   . Radiation proctitis   . Rosacea    Past Surgical History:  Procedure Laterality Date  . CARDIAC CATHETERIZATION  02/13/2011   [facility] cardiology  . CATARACT EXTRACTION     2011  . CATARACT EXTRACTION W/PHACO Right 08/06/2013   Procedure: CATARACT EXTRACTION PHACO AND INTRAOCULAR LENS PLACEMENT (IOC);  Surgeon: Tonny Branch, MD;  Location: [facility] ORS;  Service: Ophthalmology;  Laterality: Right;  CDE:20.96  . CHOLECYSTECTOMY    . COLONOSCOPY  02/12/2004   Dr.  Rourk- L side diverticula, inflammatory  colon polyps  . COLONOSCOPY  01/19/2012   RMR: Prominent changes involving the rectal mucosa consistent with radiation-induced proctitis. Multiple colonic  polyps removed as described above. Sigmoid Diverticulosis/ 1.5 x 2 cm relatively flat ulcerated lesion in cecum/path showed adenocarcinoma of the colon. descending colon polyp with tubular adenoma and one fragment of focal high grade dysplasia.   . COLONOSCOPY  08/28/2012   [id number] proctitis. Colonic  diverticulosis/Ulcerations at the surgical anastomosis  path: ulcerated colonic mucosa with prolapse changes, tubular adenoma.   . COLONOSCOPY N/A 08/02/2013   RMR: Colonic polyps -removed as described above. Colonic diverticulosis. Status post reight hemicolectomy. Radiation proctitis- asymptomatic.   Marland Kitchen COLONOSCOPY N/A 05/09/2015   Procedure: COLONOSCOPY;  Surgeon: Daneil Dolin, MD;  Location: [facility] END[facility] SUITE;  Service: Endoscopy;  Laterality: N/A;  1200-moved to [address]5 [address] to notify pt  . COLONOSCOPY WITH PROPOFOL N/A 11/10/2017   Procedure: COLONOSCOPY WITH PROPOFOL;  Surgeon: Daneil Dolin, MD;  Location: [facility];  Service: Endoscopy;  Laterality: N/A;  . CORONARY ANGIOPLASTY  06/17/14   OM1 POBA  . CORONARY STENT PLACEMENT  01/2011   2.5x44mm Xience drug-eluting stent in ramus intermedius/OMI vessel, 2.5x61mm Promus Element stent in mid LAD artery  . ESOPHAGOGASTRODUODENOSCOPY  10/10/2008   Dr. Tilda Burrow hiatal hernia, normal esophagus, normal stomach  . ESOPHAGOGASTRODUODENOSCOPY  02/12/2004   Dr. Dudley Major- normal   . ESOPHAGOGASTRODUODENOSCOPY N/A 05/09/2015   Procedure: ESOPHAGOGASTRODUODENOSCOPY (EGD);  Surgeon: Daneil Dolin, MD;  Location: [facility];  Service: Endoscopy;  Laterality: N/A;  . ESOPHAGOGASTRODUODENOSCOPY (EGD) WITH PROPOFOL N/A 11/10/2017   Procedure: ESOPHAGOGASTRODUODENOSCOPY (EGD) WITH PROPOFOL;  Surgeon: Daneil Dolin, MD;  Location: [facility];  Service: Endoscopy;  Laterality: N/A;  9:45am  . GIVENS CAPSULE STUDY N/A 06/03/2015   Procedure: GIVENS CAPSULE STUDY;  Surgeon: Daneil Dolin, MD;  Location: [facility];  Service: Endoscopy;  Laterality: N/A;  0700  . HOT HEMOSTASIS  11/10/2017   Procedure: HOT HEMOSTASIS (ARGON PLASMA COAGULATION/BICAP);  Surgeon: Daneil Dolin, MD;  Location: [facility];  Service: Endoscopy;;  rectum  . KNEE SURGERY     left knee   . LEFT HEART CATHETERIZATION WITH CORONARY ANGIOGRAM N/A 06/17/2014   Procedure: LEFT  HEART CATHETERIZATION WITH CORONARY ANGIOGRAM;  Surgeon: Burnell Blanks, MD;  Location: [facility] CATH LAB;  Service: Cardiovascular;  Laterality: N/A;  . PARTIAL COLECTOMY  02/29/2012   Procedure: PARTIAL COLECTOMY;  Surgeon: Adin Hector, MD;  Location: [facility] ORS;  Service: General;  Laterality: Right;  . POLYPECTOMY  11/10/2017   Procedure: POLYPECTOMY;  Surgeon: Daneil Dolin, MD;  Location: [facility];  Service: Endoscopy;;  gastric colon  . PROSTATECTOMY    . TONSILLECTOMY    . TRANSURETHRAL RESECTION OF PROSTATE    . VASECTOMY       SOCIAL HISTORY:  Social History   Socioeconomic History  . Marital status: Married    Spouse name: Not on file  . Number of children: 3  . 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 Highest education level: Not on file  Occupational History  . 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 Financial resource strain: Not on file  . Food insecurity    Worry: Not on file    Inability: Not on file  . Transportation needs    Medical: Not on file    Non-medical: Not on file  Tobacco Use  . Smoking  status: Never Smoker  . Smokeless tobacco: Never Used  Substance and Sexual Activity  . Alcohol use: Yes    Comment: 1 cocktail/month  . Drug use: No  . Sexual activity: Yes    Birth control/protection: None  Lifestyle  . Physical activity    Days per week: Not on file    Minutes per session: Not on file  . Stress: Not on file  Relationships  . Social Herbalist on phone: Not on file    Gets together: Not on file    Attends religious service: Not on file    Active member of club or organization: Not on file    Attends meetings of clubs or organizations: Not on file    Relationship status: Not on file  . Intimate partner violence    Fear of current or ex partner: Not on file    Emotionally abused: Not on file    Physically abused: Not on file    Forced sexual activity: Not on file  Other Topics Concern  . Not on file   Social History Narrative   Right handed   Caffeine~1 cup per day    Lives at home with wife     FAMILY HISTORY:  Family History  Problem Relation Age of Onset  . Throat cancer Father        dx in his 18s and again in his 22s; pipe and cigar smoker  . Neuropathy Brother   . Prostate cancer Brother 19  . Melanoma Brother        dx in his 65s  . Cancer Paternal Uncle        smoking related cancer  . Heart attack Paternal Grandfather   . Stomach cancer Cousin 15  . Colon cancer Neg Hx     CURRENT MEDICATIONS:  Outpatient Encounter Medications as of 06/28/2019  Medication Sig Note  . aspirin EC 81 MG tablet Take 81 mg by mouth daily.   . B Complex Vitamins (VITAMIN B COMPLEX PO) Take 1 tablet by mouth at bedtime.    . cholecalciferol (VITAMIN D) 400 units TABS tablet Take 400 Units by mouth daily.   . citalopram (CELEXA) 20 MG tablet Take 10 mg by mouth daily.    . clopidogrel (PLAVIX) 75 MG tablet TAKE 1 TABLET ONCE DAILY.   . folic acid (FOLVITE) 1 MG tablet TAKE (1) TABLET BY MOUTH ONCE DAILY . (Patient taking differently: Take 1 mg by mouth once a week. )   . latanoprost (XALATAN) 0.005 % ophthalmic solution Place 1 drop into both eyes at bedtime.    Marland Kitchen losartan (COZAAR) 50 MG tablet Take 25 mg by mouth 2 (two) times daily.    . metoprolol (LOPRESSOR) 50 MG tablet Take 50 mg by mouth 2 (two) times daily.    . montelukast (SINGULAIR) 10 MG tablet Take 10 mg by mouth at bedtime.   . Multiple Vitamin (MULTIVITAMIN WITH MINERALS) TABS tablet Take 1 tablet by mouth daily.   . pantoprazole (PROTONIX) 40 MG tablet Take 40 mg by mouth daily.   . pravastatin (PRAVACHOL) 40 MG tablet TAKE 1 TABLET BY MOUTH AT BEDTIME.   . vitamin B-12 (CYANOCOBALAMIN) 1000 MCG tablet Take by mouth.   . ALPRAZolam (XANAX) 0.5 MG tablet Take 0.5 mg by mouth daily as needed for anxiety. 10/26/2017: Primarily uses this when fly  . hydrocortisone (ANUSOL-HC) 2.5 % rectal cream Place 1 application rectally 3  (three) times daily. (Patient not taking:  Reported on 06/28/2019)   . ibuprofen (ADVIL,MOTRIN) 200 MG tablet Take 400 mg by mouth daily as needed for headache or moderate pain.   Marland Kitchen loperamide (IMODIUM) 2 MG capsule Take 2 mg by mouth as needed for diarrhea or loose stools.   . nitroGLYCERIN (NITROSTAT) 0.4 MG SL tablet PLACE ONE (1) TABLET UNDER TONGUE EVERY 5 MINUTES UP TO (3) DOSES AS NEEDED FOR CHEST PAIN. (Patient not taking: Reported on 06/28/2019)   . ofloxacin (OCUFLOX) 0.3 % ophthalmic solution Place 1 drop into both eyes as needed.    . Wheat Dextrin (BENEFIBER) POWD Take by mouth as needed. Daily in the AM     No facility-administered encounter medications on file as of 06/28/2019.     ALLERGIES:  Allergies  Allergen Reactions  . Shellfish Allergy Anaphylaxis  . Sulfonamide Derivatives Diarrhea and Nausea Only     PHYSICAL EXAM:  ECOG Performance status: 1  Vitals:   06/28/19 0914  BP: (!) 164/67  Pulse: 61  Resp: 18  Temp: (!) 97.5 F (36.4 C)  SpO2: 99%   Filed Weights   06/28/19 0914  Weight: 220 lb (99.8 kg)    Physical Exam Constitutional:      Appearance: Normal appearance.  HENT:     Head: Normocephalic.     Nose: Nose normal.     Mouth/Throat:     Mouth: Mucous membranes are moist.  Eyes:     Conjunctiva/sclera: Conjunctivae normal.  Neck:     Musculoskeletal: Normal range of motion.  Cardiovascular:     Rate and Rhythm: Normal rate and regular rhythm.     Pulses: Normal pulses.     Heart sounds: Normal heart sounds.  Pulmonary:     Effort: Pulmonary effort is normal.     Breath sounds: Normal breath sounds.  Abdominal:     General: Bowel sounds are normal.  Musculoskeletal: Normal range of motion.  Skin:    General: Skin is warm.  Neurological:     General: No focal deficit present.     Mental Status: He is alert and oriented to person, place, and time. Mental status is at baseline.  Psychiatric:        Mood and Affect: Mood normal.         Behavior: Behavior normal.        Thought Content: Thought content normal.        Judgment: Judgment normal.      LABORATORY DATA:  I have reviewed the labs as listed.  CBC    Component Value Date/Time   WBC 12.8 (H) 06/20/2019 1037   RBC 4.69 06/20/2019 1037   HGB 14.6 06/20/2019 1037   HCT 45.1 06/20/2019 1037   PLT 387 06/20/2019 1037   MCV 96.2 06/20/2019 1037   MCH 31.1 06/20/2019 1037   MCHC 32.4 06/20/2019 1037   RDW 12.7 06/20/2019 1037   LYMPHSABS 5.6 (H) 06/20/2019 1037   MONOABS 1.0 06/20/2019 1037   EOSABS 0.5 06/20/2019 1037   BASOSABS 0.1 06/20/2019 1037   CMP Latest Ref Rng & Units 06/20/2019 04/02/2019 12/19/2018  Glucose 70 - 99 mg/dL 101(H) - 90  BUN 8 - 23 mg/dL 13 - 16  Creatinine 0.61 - 1.24 mg/dL 1.13 - 1.15  Sodium 135 - 145 mmol/L 143 - 138  Potassium 3.5 - 5.1 mmol/L 4.2 - 4.4  Chloride 98 - 111 mmol/L 109 - 107  CO2 22 - 32 mmol/L 26 - 25  Calcium 8.9 - 10.3 mg/dL 8.8(L) -  8.8(L)  Total Protein 6.5 - 8.1 g/dL 6.3(L) 6.4 6.7  Total Bilirubin 0.3 - 1.2 mg/dL 1.4(H) - 0.9  Alkaline Phos 38 - 126 U/L 56 - 54  AST 15 - 41 U/L 20 - 22  ALT 0 - 44 U/L 18 - 20          ASSESSMENT & PLAN:   CLL (chronic lymphocytic leukemia) ([facility]) 1.  Stage 0 CLL: - He was diagnosed by flow on 12/13/2014 - Today's examination did not reveal any palpable adenopathy or splenomegaly. -Denies any fevers, chills, night sweats.  No weight loss.  No lymphadenopathy. -Labs are stable white blood cell count of 12.8, ANC 5.6, lymphocytes 5.6, hemoglobin 14.6 and platelets 387,000. -No indication for treatment at this time.  We will continue to monitor labs. -Patient return to clinic in 6 months.   2.  Stage III ascending colon cancer: - He had 2 out of 20 lymph nodes positive. -Status post right hemicolectomy on 02/29/2012, could not tolerate adjuvant Xeloda. - EGD showed multiple benign gastric polyps, otherwise within normal limits. -He is completed 5 years of  surveillance.  No more scans needed at this time. -He had a colonoscopy on 11/10/2017 which showed radiation proctitis changes.  Diverticulosis in the sigmoid colon.  One polyp in the ascending colon which was removed -CEA is within normal. - We will continue to follow his CEA levels and do scans if disease warrants.  3.  Prostate cancer: -Underwent radiation therapy at [facility] in 2011. -He also underwent a TURP x2. -He follows up with Dr. Nevada Crane his urologist in [facility]. -PSA levels are within normal. -We will continue to monitor PSA levels.         [place] [phone]

## 2019-07-08 NOTE — Progress Notes (Deleted)
Cardiology Office Note  Date: 07/08/2019   ID: [name], DOB October 31, 1934, MRN [id number]  PCP:  Sandi Mealy, MD  Cardiologist:  Rozann Lesches, MD Electrophysiologist:  None   No chief complaint on file.   History of Present Illness: David Melchi. is an 84 y.o. male last assessed via telehealth encounter in May.  Lower extremity ABIs done back in June were normal as outlined below.  Past Medical History:  Diagnosis Date  . Abnormality of gait 12/18/2013  . Arthritis   . Bilateral foot-drop 04/02/2019  . Biliary dyskinesia   . CAD (coronary artery disease) 2012   a. DES x2 in ramus/OM and mid LAD in 2012  b. balloon angioplaty of OM 2015  . CLL (chronic lymphocytic leukemia) ([place])   . Colon cancer [facility]) 2013   Right hemicolectomy, did not tolerate chemotherapy  . Colon polyps    tubular adenomas  . Diverticula of colon 2010  . Essential hypertension   . Exposure to [employer]   . Folic acid deficiency   . Gastroparesis 2014  . GERD (gastroesophageal reflux disease)   . Glaucoma   . Heart palpitations 2014  . Hiatal hernia 2010  . History of anemia as a child   . History of kidney stones   . Iron deficiency 2013  . Myocardial infarction ([place]) 2012  . Peripheral neuropathy   . Prostate cancer [facility]) 2010   Radiation, surgery  . PTSD (post-traumatic stress disorder)   . Radiation proctitis   . Rosacea     Past Surgical History:  Procedure Laterality Date  . CARDIAC CATHETERIZATION  02/13/2011   [facility] cardiology  . CATARACT EXTRACTION     2011  . CATARACT EXTRACTION W/PHACO Right 08/06/2013   Procedure: CATARACT EXTRACTION PHACO AND INTRAOCULAR LENS PLACEMENT (IOC);  Surgeon: Tonny Branch, MD;  Location: [facility] ORS;  Service: Ophthalmology;  Laterality: Right;  CDE:20.96  . CHOLECYSTECTOMY    . COLONOSCOPY  02/12/2004   Dr. Gala Romney- L side diverticula, inflammatory  colon polyps  . COLONOSCOPY  01/19/2012   RMR: Prominent  changes involving the rectal mucosa consistent with radiation-induced proctitis. Multiple colonic  polyps removed as described above. Sigmoid Diverticulosis/ 1.5 x 2 cm relatively flat ulcerated lesion in cecum/path showed adenocarcinoma of the colon. descending colon polyp with tubular adenoma and one fragment of focal high grade dysplasia.   . COLONOSCOPY  08/28/2012   [id number] proctitis. Colonic diverticulosis/Ulcerations at the surgical anastomosis  path: ulcerated colonic mucosa with prolapse changes, tubular adenoma.   . COLONOSCOPY N/A 08/02/2013   RMR: Colonic polyps -removed as described above. Colonic diverticulosis. Status post reight hemicolectomy. Radiation proctitis- asymptomatic.   Marland Kitchen COLONOSCOPY N/A 05/09/2015   Procedure: COLONOSCOPY;  Surgeon: Daneil Dolin, MD;  Location: [facility];  Service: Endoscopy;  Laterality: N/A;  1200-moved to [address]5 [address] to notify pt  . COLONOSCOPY WITH PROPOFOL N/A 11/10/2017   Procedure: COLONOSCOPY WITH PROPOFOL;  Surgeon: Daneil Dolin, MD;  Location: [facility];  Service: Endoscopy;  Laterality: N/A;  . CORONARY ANGIOPLASTY  06/17/14   OM1 POBA  . CORONARY STENT PLACEMENT  01/2011   2.5x13mm Xience drug-eluting stent in ramus intermedius/OMI vessel, 2.5x48mm Promus Element stent in mid LAD artery  . ESOPHAGOGASTRODUODENOSCOPY  10/10/2008   Dr. Tilda Burrow hiatal hernia, normal esophagus, normal stomach  . ESOPHAGOGASTRODUODENOSCOPY  02/12/2004   Dr. Dudley Major- normal   . ESOPHAGOGASTRODUODENOSCOPY N/A 05/09/2015   Procedure: ESOPHAGOGASTRODUODENOSCOPY (EGD);  Surgeon:  Daneil Dolin, MD;  [facility];  Service: Endoscopy;  Laterality: N/A;  . ESOPHAGOGASTRODUODENOSCOPY (EGD) WITH PROPOFOL N/A 11/10/2017   Procedure: ESOPHAGOGASTRODUODENOSCOPY (EGD) WITH PROPOFOL;  Surgeon: Daneil Dolin, MD;  [facility];  Service: Endoscopy;  Laterality: N/A;  9:45am  . GIVENS CAPSULE STUDY N/A 06/03/2015   Procedure: GIVENS CAPSULE  STUDY;  Surgeon: Daneil Dolin, MD;  [facility]: [facility];  Service: Endoscopy;  Laterality: N/A;  0700  . HOT HEMOSTASIS  11/10/2017   Procedure: HOT HEMOSTASIS (ARGON PLASMA COAGULATION/BICAP);  Surgeon: Daneil Dolin, MD;  Location: [facility];  Service: Endoscopy;;  rectum  . KNEE SURGERY     left knee   . LEFT HEART CATHETERIZATION WITH CORONARY ANGIOGRAM N/A 06/17/2014   Procedure: LEFT HEART CATHETERIZATION WITH CORONARY ANGIOGRAM;  Surgeon: Burnell Blanks, MD;  Location: [facility] CATH LAB;  Service: Cardiovascular;  Laterality: N/A;  . PARTIAL COLECTOMY  02/29/2012   Procedure: PARTIAL COLECTOMY;  Surgeon: Adin Hector, MD;  Location: [facility];  Service: General;  Laterality: Right;  . POLYPECTOMY  11/10/2017   Procedure: POLYPECTOMY;  Surgeon: Daneil Dolin, MD;  Location[facility];  Service: Endoscopy;;  gastric colon  . PROSTATECTOMY    . TONSILLECTOMY    . TRANSURETHRAL RESECTION OF PROSTATE    . VASECTOMY      Current Outpatient Medications  Medication Sig Dispense Refill  . ALPRAZolam (XANAX) 0.5 MG tablet Take 0.5 mg by mouth daily as needed for anxiety.    Marland Kitchen aspirin EC 81 MG tablet Take 81 mg by mouth daily.    . B Complex Vitamins (VITAMIN B COMPLEX PO) Take 1 tablet by mouth at bedtime.     . cholecalciferol (VITAMIN D) 400 units TABS tablet Take 400 Units by mouth daily.    . citalopram (CELEXA) 20 MG tablet Take 10 mg by mouth daily.     . clopidogrel (PLAVIX) 75 MG tablet TAKE 1 TABLET ONCE DAILY. 90 tablet 1  . folic acid (FOLVITE) 1 MG tablet TAKE (1) TABLET BY MOUTH ONCE DAILY . (Patient taking differently: Take 1 mg by mouth once a week. ) 30 tablet 0  . hydrocortisone (ANUSOL-HC) 2.5 % rectal cream Place 1 application rectally 3 (three) times daily. (Patient not taking: Reported on 06/28/2019) 30 g 1  . ibuprofen (ADVIL,MOTRIN) 200 MG tablet Take 400 mg by mouth daily as needed for headache or moderate pain.    Marland Kitchen latanoprost (XALATAN) 0.005 %  ophthalmic solution Place 1 drop into both eyes at bedtime.     Marland Kitchen loperamide (IMODIUM) 2 MG capsule Take 2 mg by mouth as needed for diarrhea or loose stools.    Marland Kitchen losartan (COZAAR) 50 MG tablet Take 25 mg by mouth 2 (two) times daily.     . metoprolol (LOPRESSOR) 50 MG tablet Take 50 mg by mouth 2 (two) times daily.     . montelukast (SINGULAIR) 10 MG tablet Take 10 mg by mouth at bedtime.    . Multiple Vitamin (MULTIVITAMIN WITH MINERALS) TABS tablet Take 1 tablet by mouth daily.    . nitroGLYCERIN (NITROSTAT) 0.4 MG SL tablet PLACE ONE (1) TABLET UNDER TONGUE EVERY 5 MINUTES UP TO (3) DOSES AS NEEDED FOR CHEST PAIN. (Patient not taking: Reported on 06/28/2019) 25 tablet 4  . ofloxacin (OCUFLOX) 0.3 % ophthalmic solution Place 1 drop into both eyes as needed.     . pantoprazole (PROTONIX) 40 MG tablet Take 40  mg by mouth daily.    . pravastatin (PRAVACHOL) 40 MG tablet TAKE 1 TABLET BY MOUTH AT BEDTIME. 90 tablet 2  . vitamin B-12 (CYANOCOBALAMIN) 1000 MCG tablet Take by mouth.    . Wheat Dextrin (BENEFIBER) POWD Take by mouth as needed. Daily in the AM      No current facility-administered medications for this visit.    Allergies:  Shellfish allergy and Sulfonamide derivatives   Social History: The patient  reports that he has never smoked. He has never used smokeless tobacco. He reports current alcohol use. He reports that he does not use drugs.   Family History: The patient's family history includes Cancer in his paternal uncle; Heart attack in his paternal grandfather; Melanoma in his brother; Neuropathy in his brother; Prostate cancer (age of onset: 45) in his brother; Stomach cancer (age of onset: 35) in his cousin; Throat cancer in his father.   ROS:  Please see the history of present illness. Otherwise, complete review of systems is positive for {NONE DEFAULTED:18576::"none"}.  All other systems are reviewed and negative.   Physical Exam: VS:  There were no vitals taken for this  visit., BMI There is no height or weight on file to calculate BMI.  Wt Readings from Last 3 Encounters:  06/28/19 220 lb (99.8 kg)  06/08/19 220 lb 9.6 oz (100.1 kg)  04/02/19 219 lb 4 oz (99.5 kg)    General: Patient appears comfortable at rest. HEENT: Conjunctiva and lids normal, oropharynx clear with moist mucosa. Neck: Supple, no elevated JVP or carotid bruits, no thyromegaly. Lungs: Clear to auscultation, nonlabored breathing at rest. Cardiac: Regular rate and rhythm, no S3 or significant systolic murmur, no pericardial rub. Abdomen: Soft, nontender, no hepatomegaly, bowel sounds present, no guarding or rebound. Extremities: No pitting edema, distal pulses 2+. Skin: Warm and dry. Musculoskeletal: No kyphosis. Neuropsychiatric: Alert and oriented x3, affect grossly appropriate.  ECG:  An ECG dated 07/12/2018 was personally reviewed today and demonstrated:  Sinus rhythm with nonspecific T wave changes.  Recent Labwork: 06/20/2019: ALT 18; AST 20; BUN 13; Creatinine, Ser 1.13; Hemoglobin 14.6; Platelets 387; Potassium 4.2; Sodium 143     Component Value Date/Time   CHOL 133 04/27/2017 0908   TRIG 106 04/27/2017 0908   HDL 63 04/27/2017 0908   CHOLHDL 2.1 04/27/2017 0908   CHOLHDL 1.7 05/05/2016 0840   VLDL 15 05/05/2016 0840   LDLCALC 49 04/27/2017 0908  David 2020: Cholesterol 119, triglycerides 96, HDL 55, LDL 45  Other Studies Reviewed Today:  Lower extremity ABIs 02/14/2019: Summary: Right: Resting right ankle-brachial index is within normal range. No evidence of significant right lower extremity arterial disease. The right toe-brachial index is normal. PPG tracings appear dampened.  Left: Resting left ankle-brachial index is within normal range. No evidence of significant left lower extremity arterial disease. The left toe-brachial index is normal.  Cardiac catheterization and PCI 06/17/2014: PCI Note:He was given an additional 6000 units IV heparin. ACT was 235. He  was then given an additional 3000 units IV heparin. ACT was over 280. I then engaged the left main with a XB 3.0 guiding catheter. I passed a Cougar IC wire down the Circumflex into the OM1. I then dilated the long segment of stenosis in OM1 with a 2.0 x 20 mm balloon x 2. I was unable to deliver a small stent despite attempting this with a buddy wire. I used a FineCross catheter to shuttle in a long Mailman wire but still could not  deliver the stent. I was unable to successfully deliver a Guideliner for guide support. The case was terminated with an acceptable balloon angioplasty result. The stenosis was taken from 99% down to 30%. There was excellent flow down the vessel.   The sheath was removed from the right radial artery and a Terumo hemostasis band was applied at the arteriotomy site on the right wrist.   There were no immediate complications. The patient was taken to the recovery area in stable condition.   Hemodynamic Findings: Central aortic pressure: 125/76 Left ventricular pressure: 127/14/17  Angiographic Findings:  Left main: No obstructive disease.   Left Anterior Descending Artery: Moderate to large caliber vessel that courses to the apex. The proximal vessel has mild disease. The mid vessel has diffuse 30% stenosis followed by a patent stented segment with no restenosis. The apical LAD becomes very small in caliber with diffuse 80% stenosis. Small caliber bifurcating diagonal branch with diffuse 30% stenosis.   Circumflex Artery: Small to moderate caliber diffusely diseased vessel with proximal 30% stenosis. There is a small caliber (2.0) obtuse marginal branch with proximal 50% stenosis followed by 99% focal stenosis. The AV groove Circumflex is small in caliber beyond the OM branch and has mild plaque disease.   Intermediate Branch:Moderate caliber vessel with patent proximal stent with minimal restenosis.   Right Coronary Artery: Large dominant vessel with 30%  proximal stenosis, diffuse 40% mid stenosis, 50% focal distal stenosis. The posterolateral branch is a small caliber vessel with 99% distal stenosis (too small for PCI).   Left Ventricular Angiogram: Deferred.   Impression: 1. Triple vessel CAD 2. Patent stents in the mid LAD and proximal intermediate branch 3. Severe stenosis in the small caliber OM branch, s/p balloon angioplasty (unable to deliver a stent due to takeoff of Circumflex from left main, size of the vessel and tortuosity)  Assessment and Plan:    Medication Adjustments/Labs and Tests Ordered: Current medicines are reviewed at length with the patient today.  Concerns regarding medicines are outlined above.   Tests Ordered: No orders of the defined types were placed in this encounter.   Medication Changes: No orders of the defined types were placed in this encounter.   Disposition:  Follow up {follow up:15908}  Signed, Satira Sark, MD, [facility] 07/08/2019 1:54 PM    [place] [facility] [place], [place], [place] [address] Phone: [phone]; Fax: [phone]

## 2019-07-09 ENCOUNTER — Ambulatory Visit: Payer: Medicare Other | Admitting: Cardiology

## 2019-08-09 ENCOUNTER — Ambulatory Visit (INDEPENDENT_AMBULATORY_CARE_PROVIDER_SITE_OTHER): Payer: Medicare Other | Admitting: Cardiology

## 2019-08-09 ENCOUNTER — Encounter: Payer: Self-pay | Admitting: Cardiology

## 2019-08-09 ENCOUNTER — Other Ambulatory Visit: Payer: Self-pay

## 2019-08-09 VITALS — BP 138/72 | HR 61 | Ht 71.0 in | Wt 218.0 lb

## 2019-08-09 DIAGNOSIS — E782 Mixed hyperlipidemia: Secondary | ICD-10-CM | POA: Diagnosis not present

## 2019-08-09 DIAGNOSIS — I25119 Atherosclerotic heart disease of native coronary artery with unspecified angina pectoris: Secondary | ICD-10-CM | POA: Diagnosis not present

## 2019-08-09 DIAGNOSIS — I10 Essential (primary) hypertension: Secondary | ICD-10-CM | POA: Diagnosis not present

## 2019-08-09 NOTE — Patient Instructions (Addendum)

## 2019-08-09 NOTE — Progress Notes (Signed)
Cardiology Office Note  Date: 08/09/2019   ID: David Heyser., DOB Aug 01, 1935, MRN [id number]  PCP:  Sandi Mealy, MD  Cardiologist:  Rozann Lesches, MD Electrophysiologist:  None   Chief Complaint  Patient presents with  . Cardiac follow-up    History of Present Illness: David Silvan. is an 84 y.o. male last assessed via telehealth encounter in May.  He presents for a routine visit today.  He tells me that he has had no angina symptoms and has not used any nitroglycerin.  He is exercising using an elliptical machine in the mornings, currently 20 minutes at a time while recovering from leg injury, was previously doing 30 minutes at a time.  I reviewed his medications.  Cardiac regimen includes aspirin, Plavix, Cozaar, Lopressor, and Pravachol.  ECG today shows sinus bradycardia with a Q wave in lead III.  Lower extremity ABIs were normal as of June.  I reviewed his recent lab work from November as outlined below.  LDL was 45.  Past Medical History:  Diagnosis Date  . Abnormality of gait 12/18/2013  . Arthritis   . Bilateral foot-drop 04/02/2019  . Biliary dyskinesia   . CAD (coronary artery disease) 2012   a. DES x2 in ramus/OM and mid LAD in 2012  b. balloon angioplaty of OM 2015  . CLL (chronic lymphocytic leukemia) ([place])   . Colon cancer [facility]) 2013   Right hemicolectomy, did not tolerate chemotherapy  . Colon polyps    tubular adenomas  . Diverticula of colon 2010  . Essential hypertension   . Exposure to [employer]   . Folic acid deficiency   . Gastroparesis 2014  . GERD (gastroesophageal reflux disease)   . Glaucoma   . Heart palpitations 2014  . Hiatal hernia 2010  . History of anemia as a child   . History of kidney stones   . Iron deficiency 2013  . Myocardial infarction ([place]) 2012  . Peripheral neuropathy   . Prostate cancer [facility]) 2010   Radiation, surgery  . PTSD (post-traumatic stress disorder)   . Radiation proctitis   .  Rosacea     Past Surgical History:  Procedure Laterality Date  . CARDIAC CATHETERIZATION  02/13/2011   [facility], [facility] cardiology  . CATARACT EXTRACTION     2011  . CATARACT EXTRACTION W/PHACO Right 08/06/2013   Procedure: CATARACT EXTRACTION PHACO AND INTRAOCULAR LENS PLACEMENT (IOC);  Surgeon: Tonny Branch, MD;  Location: [facility] ORS;  Service: Ophthalmology;  Laterality: Right;  CDE:20.96  . CHOLECYSTECTOMY    . COLONOSCOPY  02/12/2004   Dr. Gala Romney- L side diverticula, inflammatory  colon polyps  . COLONOSCOPY  01/19/2012   RMR: Prominent changes involving the rectal mucosa consistent with radiation-induced proctitis. Multiple colonic  polyps removed as described above. Sigmoid Diverticulosis/ 1.5 x 2 cm relatively flat ulcerated lesion in cecum/path showed adenocarcinoma of the colon. descending colon polyp with tubular adenoma and one fragment of focal high grade dysplasia.   . COLONOSCOPY  08/28/2012   [id number] proctitis. Colonic diverticulosis/Ulcerations at the surgical anastomosis  path: ulcerated colonic mucosa with prolapse changes, tubular adenoma.   . COLONOSCOPY N/A 08/02/2013   RMR: Colonic polyps -removed as described above. Colonic diverticulosis. Status post reight hemicolectomy. Radiation proctitis- asymptomatic.   Marland Kitchen COLONOSCOPY N/A 05/09/2015   Procedure: COLONOSCOPY;  Surgeon: Daneil Dolin, MD;  Location: [facility];  Service: Endoscopy;  Laterality: N/A;  1200-moved to [address]5 [address] to notify pt  .  COLONOSCOPY WITH PROPOFOL N/A 11/10/2017   Procedure: COLONOSCOPY WITH PROPOFOL;  Surgeon: Daneil Dolin, MD;  Location: [facility];  Service: Endoscopy;  Laterality: N/A;  . CORONARY ANGIOPLASTY  06/17/14   OM1 POBA  . CORONARY STENT PLACEMENT  01/2011   2.5x32mm Xience drug-eluting stent in ramus intermedius/OMI vessel, 2.5x23mm Promus Element stent in mid LAD artery  . ESOPHAGOGASTRODUODENOSCOPY  10/10/2008   Dr. Tilda Burrow hiatal hernia, normal esophagus, normal stomach   . ESOPHAGOGASTRODUODENOSCOPY  02/12/2004   Dr. Dudley Major- normal   . ESOPHAGOGASTRODUODENOSCOPY N/A 05/09/2015   Procedure: ESOPHAGOGASTRODUODENOSCOPY (EGD);  Surgeon: Daneil Dolin, MD;  [facility];  Service: Endoscopy;  Laterality: N/A;  . ESOPHAGOGASTRODUODENOSCOPY (EGD) WITH PROPOFOL N/A 11/10/2017   Procedure: ESOPHAGOGASTRODUODENOSCOPY (EGD) WITH PROPOFOL;  Surgeon: Daneil Dolin, MD;  [facility];  Service: Endoscopy;  Laterality: N/A;  9:45am  . GIVENS CAPSULE STUDY N/A 06/03/2015   Procedure: GIVENS CAPSULE STUDY;  Surgeon: Daneil Dolin, MD;  [facility];  Service: Endoscopy;  Laterality: N/A;  0700  . HOT HEMOSTASIS  11/10/2017   Procedure: HOT HEMOSTASIS (ARGON PLASMA COAGULATION/BICAP);  Surgeon: Daneil Dolin, MD;  Location: [facility];  Service: Endoscopy;;  rectum  . KNEE SURGERY     left knee   . LEFT HEART CATHETERIZATION WITH CORONARY ANGIOGRAM N/A 06/17/2014   Procedure: LEFT HEART CATHETERIZATION WITH CORONARY ANGIOGRAM;  Surgeon: Burnell Blanks, MD;  Location: [facility] CATH LAB;  Service: Cardiovascular;  Laterality: N/A;  . PARTIAL COLECTOMY  02/29/2012   Procedure: PARTIAL COLECTOMY;  Surgeon: Adin Hector, MD;  Location: [facility];  Service: General;  Laterality: Right;  . POLYPECTOMY  11/10/2017   Procedure: POLYPECTOMY;  Surgeon: Daneil Dolin, MD;  [facility];  Service: Endoscopy;;  gastric colon  . PROSTATECTOMY    . TONSILLECTOMY    . TRANSURETHRAL RESECTION OF PROSTATE    . VASECTOMY      Current Outpatient Medications  Medication Sig Dispense Refill  . ALPRAZolam (XANAX) 0.5 MG tablet Take 0.5 mg by mouth daily as needed for anxiety.    Marland Kitchen aspirin EC 81 MG tablet Take 81 mg by mouth daily.    . B Complex Vitamins (VITAMIN B COMPLEX PO) Take 1 tablet by mouth at bedtime.     . cholecalciferol (VITAMIN D) 400 units TABS tablet Take 400 Units by mouth daily.    . citalopram (CELEXA) 20 MG tablet Take 10 mg  by mouth daily.     . clopidogrel (PLAVIX) 75 MG tablet TAKE 1 TABLET ONCE DAILY. 90 tablet 1  . folic acid (FOLVITE) 1 MG tablet TAKE (1) TABLET BY MOUTH ONCE DAILY . (Patient taking differently: Take 1 mg by mouth once a week. ) 30 tablet 0  . hydrocortisone (ANUSOL-HC) 2.5 % rectal cream Place 1 application rectally 3 (three) times daily. 30 g 1  . ibuprofen (ADVIL,MOTRIN) 200 MG tablet Take 400 mg by mouth daily as needed for headache or moderate pain.    Marland Kitchen latanoprost (XALATAN) 0.005 % ophthalmic solution Place 1 drop into both eyes at bedtime.     Marland Kitchen loperamide (IMODIUM) 2 MG capsule Take 2 mg by mouth as needed for diarrhea or loose stools.    Marland Kitchen losartan (COZAAR) 50 MG tablet Take 25 mg by mouth 2 (two) times daily.     . metoprolol (LOPRESSOR) 50 MG tablet Take 50 mg by mouth 2 (two) times daily.     Marland Kitchen  montelukast (SINGULAIR) 10 MG tablet Take 10 mg by mouth at bedtime.    . Multiple Vitamin (MULTIVITAMIN WITH MINERALS) TABS tablet Take 1 tablet by mouth daily.    . nitroGLYCERIN (NITROSTAT) 0.4 MG SL tablet PLACE ONE (1) TABLET UNDER TONGUE EVERY 5 MINUTES UP TO (3) DOSES AS NEEDED FOR CHEST PAIN. 25 tablet 4  . ofloxacin (OCUFLOX) 0.3 % ophthalmic solution Place 1 drop into both eyes as needed.     . pantoprazole (PROTONIX) 40 MG tablet Take 40 mg by mouth daily.    . pravastatin (PRAVACHOL) 40 MG tablet TAKE 1 TABLET BY MOUTH AT BEDTIME. 90 tablet 2  . vitamin B-12 (CYANOCOBALAMIN) 1000 MCG tablet Take by mouth.    . Wheat Dextrin (BENEFIBER) POWD Take by mouth as needed. Daily in the AM      No current facility-administered medications for this visit.   Allergies:  Shellfish allergy and Sulfonamide derivatives   Social History: The patient  reports that he has never smoked. He has never used smokeless tobacco. He reports current alcohol use. He reports that he does not use drugs.   ROS:  Please see the history of present illness. Otherwise, complete review of systems is positive  for none.  All other systems are reviewed and negative.   Physical Exam: VS:  BP 138/72   Pulse 61   Ht 5\' 11"  (1.803 m)   Wt 218 lb (98.9 kg)   SpO2 98%   BMI 30.40 kg/m , BMI Body mass index is 30.4 kg/m.  Wt Readings from Last 3 Encounters:  08/09/19 218 lb (98.9 kg)  06/28/19 220 lb (99.8 kg)  06/08/19 220 lb 9.6 oz (100.1 kg)    General: Patient appears comfortable at rest. HEENT: Conjunctiva and lids normal, wearing a mask. Neck: Supple, no elevated JVP or carotid bruits, no thyromegaly. Lungs: Clear to auscultation, nonlabored breathing at rest. Cardiac: Regular rate and rhythm, no S3, soft systolic murmur. Abdomen: Soft, nontender, bowel sounds present. Extremities: No pitting edema, distal pulses 2+. Skin: Warm and dry. Musculoskeletal: No kyphosis. Neuropsychiatric: Alert and oriented x3, affect grossly appropriate.  ECG:  An ECG dated 07/12/2018 was personally reviewed today and demonstrated:  Sinus rhythm with nonspecific T wave changes.  Recent Labwork: 06/20/2019: ALT 18; AST 20; BUN 13; Creatinine, Ser 1.13; Hemoglobin 14.6; Platelets 387; Potassium 4.2; Sodium 143     Component Value Date/Time   CHOL 133 04/27/2017 0908   TRIG 106 04/27/2017 0908   HDL 63 04/27/2017 0908   CHOLHDL 2.1 04/27/2017 0908   CHOLHDL 1.7 05/05/2016 0840   VLDL 15 05/05/2016 0840   LDLCALC 49 04/27/2017 0908  November 2020: Hemoglobin 13.5, platelets 506, potassium 4.3, BUN 14, creatinine 1.07, cholesterol 119, triglycerides 96, HDL 55, LDL 45  Other Studies Reviewed Today:  Cardiac catheterization and PCI 06/17/2014: PCI Note:He was given an additional 6000 units IV heparin. ACT was 235. He was then given an additional 3000 units IV heparin. ACT was over 280. I then engaged the left main with a XB 3.0 guiding catheter. I passed a Cougar IC wire down the Circumflex into the OM1. I then dilated the long segment of stenosis in OM1 with a 2.0 x 20 mm balloon x 2. I was unable to  deliver a small stent despite attempting this with a buddy wire. I used a FineCross catheter to shuttle in a long Mailman wire but still could not deliver the stent. I was unable to successfully deliver a Guideliner  for guide support. The case was terminated with an acceptable balloon angioplasty result. The stenosis was taken from 99% down to 30%. There was excellent flow down the vessel.   The sheath was removed from the right radial artery and a Terumo hemostasis band was applied at the arteriotomy site on the right wrist.   There were no immediate complications. The patient was taken to the recovery area in stable condition.   Hemodynamic Findings: Central aortic pressure: 125/76 Left ventricular pressure: 127/14/17  Angiographic Findings:  Left main: No obstructive disease.   Left Anterior Descending Artery: Moderate to large caliber vessel that courses to the apex. The proximal vessel has mild disease. The mid vessel has diffuse 30% stenosis followed by a patent stented segment with no restenosis. The apical LAD becomes very small in caliber with diffuse 80% stenosis. Small caliber bifurcating diagonal branch with diffuse 30% stenosis.   Circumflex Artery: Small to moderate caliber diffusely diseased vessel with proximal 30% stenosis. There is a small caliber (2.0) obtuse marginal branch with proximal 50% stenosis followed by 99% focal stenosis. The AV groove Circumflex is small in caliber beyond the OM branch and has mild plaque disease.   Intermediate Branch:Moderate caliber vessel with patent proximal stent with minimal restenosis.   Right Coronary Artery: Large dominant vessel with 30% proximal stenosis, diffuse 40% mid stenosis, 50% focal distal stenosis. The posterolateral branch is a small caliber vessel with 99% distal stenosis (too small for PCI).   Left Ventricular Angiogram: Deferred.   Impression: 1. Triple vessel CAD 2. Patent stents in the mid LAD and  proximal intermediate branch 3. Severe stenosis in the small caliber OM branch, s/p balloon angioplasty (unable to deliver a stent due to takeoff of Circumflex from left main, size of the vessel and tortuosity)  Lower extremity ABIs 02/14/2019: Summary: Right: Resting right ankle-brachial index is within normal range. No evidence of significant right lower extremity arterial disease. The right toe-brachial index is normal. PPG tracings appear dampened.  Left: Resting left ankle-brachial index is within normal range. No evidence of significant left lower extremity arterial disease. The left toe-brachial index is normal.  Assessment and Plan:  1.  CAD status post DES x2 to the ramus intermedius/obtuse marginal and mid LAD in 2012 with subsequent balloon angioplasty of the OM in 2015.  He is doing well without active angina at this time on medical therapy.  I reviewed his ECG today.  Plan to continue observation on aspirin, Plavix, Cozaar, Lopressor, and Pravachol.  2.  Mixed hyperlipidemia, tolerating Pravachol with most recent LDL 55.  3.  Normal lower extremity ABIs.  4.  Essential hypertension, systolic is in the [id number] today.  No changes made to present regimen.  Medication Adjustments/Labs and Tests Ordered: Current medicines are reviewed at length with the patient today.  Concerns regarding medicines are outlined above.   Tests Ordered: Orders Placed This Encounter  Procedures  . EKG 12-Lead    Medication Changes: No orders of the defined types were placed in this encounter.   Disposition:  Follow up 6 months in the [place] office.  Signed, Satira Sark, MD, [facility] 08/09/2019 4:54 PM    [facility] [address], [place], [place] [address] Phone: [phone]; Fax: [phone]

## 2019-10-10 ENCOUNTER — Ambulatory Visit (INDEPENDENT_AMBULATORY_CARE_PROVIDER_SITE_OTHER): Payer: Medicare PPO | Admitting: Neurology

## 2019-10-10 ENCOUNTER — Encounter: Payer: Self-pay | Admitting: Neurology

## 2019-10-10 ENCOUNTER — Other Ambulatory Visit: Payer: Self-pay

## 2019-10-10 VITALS — BP 132/75 | HR 55 | Temp 97.8°F | Ht 71.0 in | Wt 219.0 lb

## 2019-10-10 DIAGNOSIS — G609 Hereditary and idiopathic neuropathy, unspecified: Secondary | ICD-10-CM | POA: Diagnosis not present

## 2019-10-10 NOTE — Patient Instructions (Signed)
It was great to meet you today! Let me know if the pain becomes more intense in your feet. Please be careful not to fall. See you back in 6 months!

## 2019-10-10 NOTE — Progress Notes (Signed)
PATIENT: David Pugh. DOB: May 26, 1935  REASON FOR VISIT: follow up HISTORY FROM: patient  HISTORY OF PRESENT ILLNESS: Today 10/10/19  David Pugh is an 85 year old male with history of peripheral neuropathy.  He has no pain associated with the neuropathy.  He was set up for physical therapy and given AFO braces.  Laboratory evaluation (B12, ANA, multiple myeloma panel, B burgdorferi) was unremarkable.  The AFO braces were helpful, but they resulted in knee pain, he was unable to tolerate wearing them.  He continues to report numbness in his feet up to mid shin.  Just this last week, he has had a few occasional sharp pains to the bottom of his feet, that are very brief.  They do not require medication, or prevent him from doing anything.  He did sustain a fall in October, while walking outside, he tripped, landed on his left side.  Had significant bruising.  Was hospitalized at [facility] for 3 days.  No abnormality was found.  He reports he sleeps well at night, symptoms do not keep him up.  He uses a cane for ambulation.  He uses an elliptical 5 days a week.  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  He did complete physical therapy for gait training, that was helpful for his balance.  He presents today for evaluation unaccompanied.  HISTORY 04/02/2019 Dr. Jannifer Franklin: David Pugh is an 85 year old right-handed white male with a history of a peripheral neuropathy, he was seen to this office initially approximately 10 years ago.  The patient was last seen in April 2015.  At that time, he was having difficulty with gait instability, he was using a cane for ambulation.  Over time, the peripheral neuropathy has gradually worsened, he stumbles frequently, but he has not had any falls.  He has no pain associated with neuropathy in his hands or in his feet fortunately.  He feels numb in the distal third of the legs bilaterally.  He does drop things from his hands on occasion.  He is  still able to operate a motor vehicle, he uses both feet to operate the pedals.  He has to be careful when going up and down stairs, he will hold onto the railing.  He sleeps well at night, he denies any pain issues in this regard.  He does have some low back pain, he denies neck pain.  He reports that he does have hammertoes particularly on the right foot.  He was sent to this office for an evaluation.   REVIEW OF SYSTEMS: Out of a complete 14 system review of symptoms, the patient complains only of the following symptoms, and all other reviewed systems are negative.  Gait abnormality, numbness  ALLERGIES: Allergies  Allergen Reactions  . Shellfish Allergy Anaphylaxis  . Sulfonamide Derivatives Diarrhea and Nausea Only    HOME MEDICATIONS: Outpatient Medications Prior to Visit  Medication Sig Dispense Refill  . ALPRAZolam (XANAX) 0.5 MG tablet Take 0.5 mg by mouth daily as needed for anxiety.    Marland Kitchen aspirin EC 81 MG tablet Take 81 mg by mouth daily.    . B Complex Vitamins (VITAMIN B COMPLEX PO) Take 1 tablet by mouth at bedtime.     . cholecalciferol (VITAMIN D) 400 units TABS tablet Take 400 Units by mouth daily.    . citalopram (CELEXA) 20 MG tablet Take 10 mg by mouth daily.     . clopidogrel (PLAVIX) 75 MG tablet TAKE 1  TABLET ONCE DAILY. 90 tablet 1  . fluticasone (FLONASE) 50 MCG/ACT nasal spray Place 1 spray into both nostrils at bedtime.    . folic acid (FOLVITE) 1 MG tablet TAKE (1) TABLET BY MOUTH ONCE DAILY . (Patient taking differently: Take 1 mg by mouth once a week. ) 30 tablet 0  . hydrocortisone (ANUSOL-HC) 2.5 % rectal cream Place 1 application rectally 3 (three) times daily. 30 g 1  . ibuprofen (ADVIL,MOTRIN) 200 MG tablet Take 400 mg by mouth daily as needed for headache or moderate pain.    Marland Kitchen latanoprost (XALATAN) 0.005 % ophthalmic solution Place 1 drop into both eyes at bedtime.     Marland Kitchen loperamide (IMODIUM) 2 MG capsule Take 2 mg by mouth as needed for diarrhea or loose  stools.    Marland Kitchen losartan (COZAAR) 50 MG tablet Take 25 mg by mouth 2 (two) times daily.     . metoprolol (LOPRESSOR) 50 MG tablet Take 50 mg by mouth 2 (two) times daily.     . montelukast (SINGULAIR) 10 MG tablet Take 10 mg by mouth at bedtime.    . Multiple Vitamin (MULTIVITAMIN WITH MINERALS) TABS tablet Take 1 tablet by mouth daily.    . nitroGLYCERIN (NITROSTAT) 0.4 MG SL tablet PLACE ONE (1) TABLET UNDER TONGUE EVERY 5 MINUTES UP TO (3) DOSES AS NEEDED FOR CHEST PAIN. 25 tablet 4  . ofloxacin (OCUFLOX) 0.3 % ophthalmic solution Place 1 drop into both eyes as needed.     . pantoprazole (PROTONIX) 40 MG tablet Take 40 mg by mouth daily.    . pravastatin (PRAVACHOL) 40 MG tablet TAKE 1 TABLET BY MOUTH AT BEDTIME. 90 tablet 2  . vitamin B-12 (CYANOCOBALAMIN) 1000 MCG tablet Take by mouth.    . Wheat Dextrin (BENEFIBER) POWD Take by mouth as needed. Daily in the AM      No facility-administered medications prior to visit.    PAST MEDICAL HISTORY: Past Medical History:  Diagnosis Date  . Abnormality of gait 12/18/2013  . Arthritis   . Bilateral foot-drop 04/02/2019  . Biliary dyskinesia   . CAD (coronary artery disease) 2012   a. DES x2 in ramus/OM and mid LAD in 2012  b. balloon angioplaty of OM 2015  . CLL (chronic lymphocytic leukemia) ([place])   . Colon cancer [facility]) 2013   Right hemicolectomy, did not tolerate chemotherapy  . Colon polyps    tubular adenomas  . Diverticula of colon 2010  . Essential hypertension   . Exposure to [employer]   . Folic acid deficiency   . Gastroparesis 2014  . GERD (gastroesophageal reflux disease)   . Glaucoma   . Heart palpitations 2014  . Hiatal hernia 2010  . History of anemia as a child   . History of kidney stones   . Iron deficiency 2013  . Myocardial infarction ([place]) 2012  . Peripheral neuropathy   . Prostate cancer [facility]) 2010   Radiation, surgery  . PTSD (post-traumatic stress disorder)   . Radiation proctitis   . Rosacea     PAST  SURGICAL HISTORY: Past Surgical History:  Procedure Laterality Date  . CARDIAC CATHETERIZATION  02/13/2011   [facility] cardiology  . CATARACT EXTRACTION     2011  . CATARACT EXTRACTION W/PHACO Right 08/06/2013   Procedure: CATARACT EXTRACTION PHACO AND INTRAOCULAR LENS PLACEMENT (IOC);  Surgeon: Tonny Branch, MD;  Location: [facility] ORS;  Service: Ophthalmology;  Laterality: Right;  CDE:20.96  . CHOLECYSTECTOMY    . COLONOSCOPY  02/12/2004   Dr. Gala Romney- L side diverticula, inflammatory  colon polyps  . COLONOSCOPY  01/19/2012   RMR: Prominent changes involving the rectal mucosa consistent with radiation-induced proctitis. Multiple colonic  polyps removed as described above. Sigmoid Diverticulosis/ 1.5 x 2 cm relatively flat ulcerated lesion in cecum/path showed adenocarcinoma of the colon. descending colon polyp with tubular adenoma and one fragment of focal high grade dysplasia.   . COLONOSCOPY  08/28/2012   [id number] proctitis. Colonic diverticulosis/Ulcerations at the surgical anastomosis  path: ulcerated colonic mucosa with prolapse changes, tubular adenoma.   . COLONOSCOPY N/A 08/02/2013   RMR: Colonic polyps -removed as described above. Colonic diverticulosis. Status post reight hemicolectomy. Radiation proctitis- asymptomatic.   Marland Kitchen COLONOSCOPY N/A 05/09/2015   Procedure: COLONOSCOPY;  Surgeon: Daneil Dolin, MD;  Location: [facility] END[facility] SUITE;  Service: Endoscopy;  Laterality: N/A;  1200-moved to 1145 [address] to notify pt  . COLONOSCOPY WITH PROPOFOL N/A 11/10/2017   Procedure: COLONOSCOPY WITH PROPOFOL;  Surgeon: Daneil Dolin, MD;  Location: [facility];  Service: Endoscopy;  Laterality: N/A;  . CORONARY ANGIOPLASTY  06/17/14   OM1 POBA  . CORONARY STENT PLACEMENT  01/2011   2.5x61m Xience drug-eluting stent in ramus intermedius/OMI vessel, 2.5x679mPromus Element stent in mid LAD artery  . ESOPHAGOGASTRODUODENOSCOPY  10/10/2008   Dr. RoTilda Burrowiatal hernia, normal esophagus, normal  stomach  . ESOPHAGOGASTRODUODENOSCOPY  02/12/2004   Dr. RoDudley Majornormal   . ESOPHAGOGASTRODUODENOSCOPY N/A 05/09/2015   Procedure: ESOPHAGOGASTRODUODENOSCOPY (EGD);  Surgeon: RoDaneil DolinMD;  [facility];  Service: Endoscopy;  Laterality: N/A;  . ESOPHAGOGASTRODUODENOSCOPY (EGD) WITH PROPOFOL N/A 11/10/2017   Procedure: ESOPHAGOGASTRODUODENOSCOPY (EGD) WITH PROPOFOL;  Surgeon: RoDaneil DolinMD;  Location: [facility];  Service: Endoscopy;  Laterality: N/A;  9:45am  . GIVENS CAPSULE STUDY N/A 06/03/2015   Procedure: GIVENS CAPSULE STUDY;  Surgeon: RoDaneil DolinMD;  Location: [facility];  Service: Endoscopy;  Laterality: N/A;  0700  . HOT HEMOSTASIS  11/10/2017   Procedure: HOT HEMOSTASIS (ARGON PLASMA COAGULATION/BICAP);  Surgeon: RoDaneil DolinMD;  Location: [facility];  Service: Endoscopy;;  rectum  . KNEE SURGERY     left knee   . LEFT HEART CATHETERIZATION WITH CORONARY ANGIOGRAM N/A 06/17/2014   Procedure: LEFT HEART CATHETERIZATION WITH CORONARY ANGIOGRAM;  Surgeon: ChBurnell BlanksMD;  [facility];  Service: Cardiovascular;  Laterality: N/A;  . PARTIAL COLECTOMY  02/29/2012   Procedure: PARTIAL COLECTOMY;  Surgeon: HaAdin HectorMD;  Location: [facility];  Service: General;  Laterality: Right;  . POLYPECTOMY  11/10/2017   Procedure: POLYPECTOMY;  Surgeon: RoDaneil DolinMD;  Location: [facility];  Service: Endoscopy;;  gastric colon  . PROSTATECTOMY    . TONSILLECTOMY    . TRANSURETHRAL RESECTION OF PROSTATE    . VASECTOMY      FAMILY HISTORY: Family History  Problem Relation Age of Onset  . Throat cancer Father        dx in his 5021snd again in his 750spipe and cigar smoker  . Neuropathy Brother   . Prostate cancer Brother [id number]. Melanoma Brother        dx in his 4049s. Cancer Paternal Uncle        smoking related cancer  . Heart attack Paternal Grandfather   . Stomach cancer Cousin [id number]. Colon cancer Neg Hx     SOCIAL  HISTORY: Social History  Socioeconomic History  . Marital status: Married    Spouse name: Not on file  . Number of children: 3  . 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 Highest education level: Not on file  Occupational History  . 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 Smoking status: Never Smoker  . Smokeless tobacco: Never Used  Substance and Sexual Activity  . Alcohol use: Yes    Comment: 1 cocktail/month  . Drug use: No  . Sexual activity: Not on file  Other Topics Concern  . Not on file  Social History Narrative   Right handed   Caffeine~1 cup per day    Lives at home with wife    Social Determinants of Health   Financial Resource Strain:   . Difficulty of Paying Living Expenses: Not on file  Food Insecurity:   . Worried About Charity fundraiser in the Last Year: Not on file  . Ran Out of Food in the Last Year: Not on file  Transportation Needs:   . Lack of Transportation (Medical): Not on file  . Lack of Transportation (Non-Medical): Not on file  Physical Activity:   . Days of Exercise per Week: Not on file  . Minutes of Exercise per Session: Not on file  Stress:   . Feeling of Stress : Not on file  Social Connections:   . Frequency of Communication with Friends and Family: Not on file  . Frequency of Social Gatherings with Friends and Family: Not on file  . Attends Religious Services: Not on file  . Active Member of Clubs or Organizations: Not on file  . Attends Archivist Meetings: Not on file  . Marital Status: Not on file  Intimate Partner Violence:   . Fear of Current or Ex-Partner: Not on file  . Emotionally Abused: Not on file  . Physically Abused: Not on file  . Sexually Abused: Not on file   PHYSICAL EXAM  Vitals:   10/10/19 1224  BP: 132/75  Pulse: (!) 55  Temp: 97.8 F (36.6 C)  TempSrc: Oral  Weight: 219 lb (99.3 kg)  Height: 5' 11"  (1.803 m)   Body mass index is 30.54 kg/m.  Generalized: Well developed, in  no acute distress   Neurological examination  Mentation: Alert oriented to time, place, history taking. Follows all commands speech and language fluent Cranial nerve II-XII: Pupils were equal round reactive to light. Extraocular movements were full, visual field were full on confrontational test. Facial sensation and strength were normal.  Head turning and shoulder shrug were normal and symmetric. Motor: The motor testing reveals 5 over 5 strength of all 4 extremities. Good symmetric motor tone is noted throughout.  Prominent bilateral foot drop. Sensory: Stocking pattern sensory deficit to pinprick, along with vibration sensation to mid shin Coordination: Cerebellar testing reveals good finger-nose-finger and heel-to-shin bilaterally.  Gait and station: Has to push off from seated position to stand.  Gait is wide-based, unsteady, uses a cane for ambulation.  Tandem gait was not attempted.  Romberg was unsteady. Reflexes: Deep tendon reflexes are symmetric    DIAGNOSTIC DATA (LABS, IMAGING, TESTING) - I reviewed patient records, labs, notes, testing and imaging myself where available.  Lab Results  Component Value Date   WBC 12.8 (H) 06/20/2019   HGB 14.6 06/20/2019   HCT 45.1 06/20/2019   MCV 96.2 06/20/2019   PLT 387 06/20/2019      Component Value Date/Time   NA 143  06/20/2019 1037   K 4.2 06/20/2019 1037   CL 109 06/20/2019 1037   CO2 26 06/20/2019 1037   GLUCOSE 101 (H) 06/20/2019 1037   BUN 13 06/20/2019 1037   CREATININE 1.13 06/20/2019 1037   CREATININE 1.20 (H) 05/05/2016 0840   CALCIUM 8.8 (L) 06/20/2019 1037   PROT 6.3 (L) 06/20/2019 1037   PROT 6.4 04/02/2019 1248   ALBUMIN 3.8 06/20/2019 1037   AST 20 06/20/2019 1037   ALT 18 06/20/2019 1037   ALKPHOS 56 06/20/2019 1037   BILITOT 1.4 (H) 06/20/2019 1037   GFRNONAA >60 06/20/2019 1037   GFRAA >60 06/20/2019 1037   Lab Results  Component Value Date   CHOL 133 04/27/2017   HDL 63 04/27/2017   LDLCALC 49  04/27/2017   TRIG 106 04/27/2017   CHOLHDL 2.1 04/27/2017   Lab Results  Component Value Date   HGBA1C 5.9 (H) 11/25/2013   Lab Results  Component Value Date   KDXIPJAS50 539 06/20/2019   Lab Results  Component Value Date   TSH 3.129 10/14/2012      ASSESSMENT AND PLAN 85 y.o. year old male  has a past medical history of Abnormality of gait (12/18/2013), Arthritis, Bilateral foot-drop (04/02/2019), Biliary dyskinesia, CAD (coronary artery disease) (2012), CLL (chronic lymphocytic leukemia) ([place]), Colon cancer ([place]) (2013), Colon polyps, Diverticula of colon (2010), Essential hypertension, Exposure to [employer], Folic acid deficiency, Gastroparesis (2014), GERD (gastroesophageal reflux disease), Glaucoma, Heart palpitations (2014), Hiatal hernia (2010), History of anemia as a child, History of kidney stones, Iron deficiency (2013), Myocardial infarction ([place]) (2012), Peripheral neuropathy, Prostate cancer ([place]) (2010), PTSD (post-traumatic stress disorder), Radiation proctitis, and Rosacea. here with:  1.  Peripheral neuropathy 2.  Bilateral foot drops 3.  Gait disturbance  Overall, his symptoms remain stable, but he has reported some occasional sharp pains to the bottom of his feet.  He does not wish to pursue medication at this time, he will let me know if they worsen.  He should continue to use a cane for ambulation.  He should be careful not to fall.  He was unable to tolerate AFO braces, as they resulted in knee pain.  Gait training and physical therapy were helpful for his balance.  He will follow-up in 6 months or sooner if needed.   I spent 15 minutes with the patient. 50% of this time was spent discussing his plan of care.   Butler Denmark, AGNP-C, DNP 10/10/2019, 1:12 PM [facility] [address], [place], [place] [address] [phone]

## 2019-10-10 NOTE — Progress Notes (Signed)
I have read the note, and I agree with the clinical assessment and plan.  Erikson Danzy K Biff Rutigliano   

## 2019-11-12 ENCOUNTER — Encounter: Payer: Self-pay | Admitting: Internal Medicine

## 2019-12-17 ENCOUNTER — Other Ambulatory Visit (INDEPENDENT_AMBULATORY_CARE_PROVIDER_SITE_OTHER): Payer: Self-pay | Admitting: Ophthalmology

## 2019-12-25 ENCOUNTER — Ambulatory Visit: Payer: Medicare Other | Admitting: Internal Medicine

## 2019-12-30 ENCOUNTER — Other Ambulatory Visit: Payer: Self-pay | Admitting: Cardiology

## 2019-12-31 ENCOUNTER — Other Ambulatory Visit: Payer: Self-pay | Admitting: Cardiology

## 2020-01-02 ENCOUNTER — Other Ambulatory Visit (HOSPITAL_COMMUNITY): Payer: Self-pay | Admitting: *Deleted

## 2020-01-02 DIAGNOSIS — C9110 Chronic lymphocytic leukemia of B-cell type not having achieved remission: Secondary | ICD-10-CM

## 2020-01-03 ENCOUNTER — Inpatient Hospital Stay (HOSPITAL_COMMUNITY): Payer: Medicare PPO | Attending: Hematology

## 2020-01-03 ENCOUNTER — Inpatient Hospital Stay (HOSPITAL_COMMUNITY): Payer: TRICARE For Life (TFL)

## 2020-01-03 ENCOUNTER — Other Ambulatory Visit: Payer: Self-pay

## 2020-01-03 DIAGNOSIS — Z85038 Personal history of other malignant neoplasm of large intestine: Secondary | ICD-10-CM | POA: Diagnosis not present

## 2020-01-03 DIAGNOSIS — Z8249 Family history of ischemic heart disease and other diseases of the circulatory system: Secondary | ICD-10-CM | POA: Insufficient documentation

## 2020-01-03 DIAGNOSIS — Z8546 Personal history of malignant neoplasm of prostate: Secondary | ICD-10-CM | POA: Insufficient documentation

## 2020-01-03 DIAGNOSIS — Z923 Personal history of irradiation: Secondary | ICD-10-CM | POA: Insufficient documentation

## 2020-01-03 DIAGNOSIS — Z9221 Personal history of antineoplastic chemotherapy: Secondary | ICD-10-CM | POA: Insufficient documentation

## 2020-01-03 DIAGNOSIS — C9110 Chronic lymphocytic leukemia of B-cell type not having achieved remission: Secondary | ICD-10-CM | POA: Diagnosis present

## 2020-01-03 DIAGNOSIS — Z8719 Personal history of other diseases of the digestive system: Secondary | ICD-10-CM | POA: Diagnosis not present

## 2020-01-03 LAB — CBC WITH DIFFERENTIAL/PLATELET
Abs Immature Granulocytes: 0.07 10*3/uL (ref 0.00–0.07)
Basophils Absolute: 0.1 10*3/uL (ref 0.0–0.1)
Basophils Relative: 1 %
Eosinophils Absolute: 0.5 10*3/uL (ref 0.0–0.5)
Eosinophils Relative: 3 %
HCT: 44.5 % (ref 39.0–52.0)
Hemoglobin: 14.1 g/dL (ref 13.0–17.0)
Immature Granulocytes: 0 %
Lymphocytes Relative: 43 %
Lymphs Abs: 7.1 10*3/uL — ABNORMAL HIGH (ref 0.7–4.0)
MCH: 30.3 pg (ref 26.0–34.0)
MCHC: 31.7 g/dL (ref 30.0–36.0)
MCV: 95.7 fL (ref 80.0–100.0)
Monocytes Absolute: 1.5 10*3/uL — ABNORMAL HIGH (ref 0.1–1.0)
Monocytes Relative: 9 %
Neutro Abs: 7.1 10*3/uL (ref 1.7–7.7)
Neutrophils Relative %: 44 %
Platelets: 373 10*3/uL (ref 150–400)
RBC: 4.65 MIL/uL (ref 4.22–5.81)
RDW: 13.1 % (ref 11.5–15.5)
WBC: 16.3 10*3/uL — ABNORMAL HIGH (ref 4.0–10.5)
nRBC: 0 % (ref 0.0–0.2)

## 2020-01-03 LAB — COMPREHENSIVE METABOLIC PANEL
ALT: 21 U/L (ref 0–44)
AST: 20 U/L (ref 15–41)
Albumin: 3.7 g/dL (ref 3.5–5.0)
Alkaline Phosphatase: 54 U/L (ref 38–126)
Anion gap: 7 (ref 5–15)
BUN: 18 mg/dL (ref 8–23)
CO2: 26 mmol/L (ref 22–32)
Calcium: 8.8 mg/dL — ABNORMAL LOW (ref 8.9–10.3)
Chloride: 107 mmol/L (ref 98–111)
Creatinine, Ser: 1.09 mg/dL (ref 0.61–1.24)
GFR calc Af Amer: >60 mL/min (ref >60)
GFR calc non Af Amer: >60 mL/min (ref >60)
Glucose, Bld: 88 mg/dL (ref 70–99)
Potassium: 4 mmol/L (ref 3.5–5.1)
Sodium: 140 mmol/L (ref 135–145)
Total Bilirubin: 0.7 mg/dL (ref 0.3–1.2)
Total Protein: 6.6 g/dL (ref 6.5–8.1)

## 2020-01-03 LAB — VITAMIN B12: Vitamin B-12: 401 pg/mL (ref 180–914)

## 2020-01-03 LAB — LACTATE DEHYDROGENASE: LDH: 133 U/L (ref 98–192)

## 2020-01-03 LAB — PSA: Prostatic Specific Antigen: 0.08 ng/mL (ref 0.00–4.00)

## 2020-01-03 LAB — VITAMIN D 25 HYDROXY (VIT D DEFICIENCY, FRACTURES): Vit D, 25-Hydroxy: 23.55 ng/mL — ABNORMAL LOW (ref 30–100)

## 2020-01-04 LAB — CEA: CEA: 2 ng/mL (ref 0.0–4.7)

## 2020-01-06 ENCOUNTER — Other Ambulatory Visit: Payer: Self-pay | Admitting: Cardiology

## 2020-01-07 ENCOUNTER — Other Ambulatory Visit: Payer: Self-pay | Admitting: Cardiology

## 2020-01-09 ENCOUNTER — Other Ambulatory Visit: Payer: Self-pay

## 2020-01-09 ENCOUNTER — Inpatient Hospital Stay (HOSPITAL_BASED_OUTPATIENT_CLINIC_OR_DEPARTMENT_OTHER): Payer: Medicare PPO | Admitting: Nurse Practitioner

## 2020-01-09 DIAGNOSIS — C9110 Chronic lymphocytic leukemia of B-cell type not having achieved remission: Secondary | ICD-10-CM | POA: Diagnosis not present

## 2020-01-09 NOTE — Assessment & Plan Note (Signed)
1.  Stage 0 CLL: - He was diagnosed by flow on 12/13/2014 - Today's examination did not reveal any palpable adenopathy or splenomegaly. -She denies any recurrent infections or hospitalizations. -She denies any fevers, chills, night sweats or unexplained weight loss.  No adenopathy. -Labs on 01/03/2020 showed his WBC at 16.3 hemoglobin was 14.1, and platelets were 373. -No indication for treatment at this time.  We will continue to monitor labs. -We will see him back in 6 months for follow-up and repeat blood work and physical exam.  2.  Stage III ascending colon cancer: - He had 2 out of 20 lymph nodes positive. -Status post right hemicolectomy on 02/29/2012, could not tolerate adjuvant Xeloda. - EGD showed multiple benign gastric polyps, otherwise within normal limits. -He is completed 5 years of surveillance.  No more scans needed at this time. -He had a colonoscopy on 11/10/2017 which showed radiation proctitis changes.  Diverticulosis in the sigmoid colon.  One polyp in the ascending colon which was removed - Labs on 01/03/2020 showed his CEA level at 2.0 - We will continue to follow his CEA levels and do scans if disease warrants.  3.  Prostate cancer: -Underwent radiation therapy at [facility] in 2011. -He also underwent a TURP x2. - Labs on 01/03/2020 showed his PSA level at 0.08 -He follows up with Dr. Nevada Crane his urologist in [facility]. -We will continue to monitor PSA levels.

## 2020-01-09 NOTE — Progress Notes (Signed)
David Pugh, David Pugh [id number]   CLINIC:  Medical Oncology/Hematology  PCP:  David Merles, MD [phone] [place] [address] [phone]   REASON FOR VISIT: Follow-up for CLL   CURRENT THERAPY: Clinical surveillance  BRIEF ONCOLOGIC HISTORY:  Oncology History  Cancer of ascending colon (David Pugh)  02/29/2012 Procedure   laparoscopic assisted R colectomy   03/09/2012 Miscellaneous   Iron deficiency and folic acid deficiency   03/29/2012 Initial Diagnosis   Cancer of ascending colon, CEA on 01/19/2012 was WNL   08/02/2013 Procedure   Ileocolonoscopy with snare polypectomy   06/11/2014 Imaging   CT abdomen/pelvis with no findings to suggest metastatic disease. Diverticulosis   06/11/2016 Imaging   CT Abdomen/Pelvis with no findings to suggest recurrent or metastatic disease. Prostatomegaly.      CANCER STAGING: Cancer Staging Cancer of ascending colon (David Pugh) Staging form: Colon and Rectum, AJCC 7th Edition - Clinical: Stage IIIB (T3, N1c, M0) - Signed by Baird Cancer, PA on 05/02/2012    INTERVAL HISTORY:  David Pugh 85 Pugh.o. male returns for routine follow-up for CLL.  Patient reports he is doing well since his last visit.  He denies any B symptoms including fevers, chills, night sweats or unexplained weight loss. Denies any nausea, vomiting, or diarrhea. Denies any new pains. Had not noticed any recent bleeding such as epistaxis, hematuria or hematochezia. Denies recent chest pain on exertion, shortness of breath on minimal exertion, pre-syncopal episodes, or palpitations. Denies any numbness or tingling in hands or feet. Denies any recent fevers, infections, or recent hospitalizations. Patient reports appetite at 100% and energy level at 75%.  Patient is eating well maintain his weight at this time.     REVIEW OF SYSTEMS:  Review of Systems  Gastrointestinal: Positive for constipation.  Neurological:  Positive for numbness.  Psychiatric/Behavioral: The patient is nervous/anxious.   All other systems reviewed and are negative.    PAST MEDICAL/SURGICAL HISTORY:  Past Medical History:  Diagnosis Date  . Abnormality of gait 12/18/2013  . Arthritis   . Bilateral foot-drop 04/02/2019  . Biliary dyskinesia   . CAD (coronary artery disease) 2012   a. DES x2 in ramus/OM and mid LAD in 2012  b. balloon angioplaty of OM 2015  . CLL (chronic lymphocytic leukemia) ([place])   . Colon cancer [facility]) 2013   Right hemicolectomy, did not tolerate chemotherapy  . Colon polyps    tubular adenomas  . Diverticula of colon 2010  . Essential hypertension   . Exposure to [employer]   . Folic acid deficiency   . Gastroparesis 2014  . GERD (gastroesophageal reflux disease)   . Glaucoma   . Heart palpitations 2014  . Hiatal hernia 2010  . History of anemia as a child   . History of kidney stones   . Iron deficiency 2013  . Myocardial infarction ([place]) 2012  . Peripheral neuropathy   . Prostate cancer [facility]) 2010   Radiation, surgery  . PTSD (post-traumatic stress disorder)   . Radiation proctitis   . Rosacea    Past Surgical History:  Procedure Laterality Date  . CARDIAC CATHETERIZATION  02/13/2011   [facility], [facility] cardiology  . CATARACT EXTRACTION     2011  . CATARACT EXTRACTION W/PHACO Right 08/06/2013   Procedure: CATARACT EXTRACTION PHACO AND INTRAOCULAR LENS PLACEMENT (IOC);  Surgeon: David Branch, MD;  Location: [facility] ORS;  Service: Ophthalmology;  Laterality: Right;  CDE:20.96  . CHOLECYSTECTOMY    .  COLONOSCOPY  02/12/2004   David Pugh- L side diverticula, inflammatory  colon polyps  . COLONOSCOPY  01/19/2012   RMR: Prominent changes involving the rectal mucosa consistent with radiation-induced proctitis. Multiple colonic  polyps removed as described above. Sigmoid Diverticulosis/ 1.5 x 2 cm relatively flat ulcerated lesion in cecum/path showed adenocarcinoma of the colon. descending colon polyp  with tubular adenoma and one fragment of focal high grade dysplasia.   . COLONOSCOPY  08/28/2012   [id number] proctitis. Colonic diverticulosis/Ulcerations at the surgical anastomosis  path: ulcerated colonic mucosa with prolapse changes, tubular adenoma.   . COLONOSCOPY N/A 08/02/2013   RMR: Colonic polyps -removed as described above. Colonic diverticulosis. Status post reight hemicolectomy. Radiation proctitis- asymptomatic.   Marland Kitchen COLONOSCOPY N/A 05/09/2015   Procedure: COLONOSCOPY;  Surgeon: David Dolin, MD;  Location[facility] END[facility] SU[facility];  Service: Endoscopy;  Laterality: N/A;  1200-moved to [address]5 [place] to notify pt  . COLONOSCOPY WITH PROPOFOL N/A 11/10/2017   Procedure: COLONOSCOPY WITH PROPOFOL;  Surgeon: David Dolin, MD;  Location: [facility] END[facility] SU[facility];  Service: Endoscopy;  Laterality: N/A;  . CORONARY ANGIOPLASTY  06/17/14   OM1 POBA  . CORONARY STENT PLACEMENT  01/2011   2.5x44mm Xience drug-eluting stent in ramus intermedius/OMI vessel, 2.5x47mm Promus Element stent in mid LAD artery  . ESOPHAGOGASTRODUODENOSCOPY  10/10/2008   David Pugh hiatal hernia, normal esophagus, normal stomach  . ESOPHAGOGASTRODUODENOSCOPY  02/12/2004   David Pugh- normal   . ESOPHAGOGASTRODUODENOSCOPY N/A 05/09/2015   Procedure: ESOPHAGOGASTRODUODENOSCOPY (EGD);  Surgeon: David Dolin, MD;  [facility];  Service: Endoscopy;  Laterality: N/A;  . ESOPHAGOGASTRODUODENOSCOPY (EGD) WITH PROPOFOL N/A 11/10/2017   Procedure: ESOPHAGOGASTRODUODENOSCOPY (EGD) WITH PROPOFOL;  Surgeon: David Dolin, MD;  [facility];  Service: Endoscopy;  Laterality: N/A;  9:45am  . GIVENS CAPSULE STUDY N/A 06/03/2015   Procedure: GIVENS CAPSULE STUDY;  Surgeon: David Dolin, MD;  [facility];  Service: Endoscopy;  Laterality: N/A;  0700  . HOT HEMOSTASIS  11/10/2017   Procedure: HOT HEMOSTASIS (ARGON PLASMA COAGULATION/BICAP);  Surgeon: David Dolin, MD;  Location[facility];  Service: Endoscopy;;   rectum  . KNEE SURGERY     left knee   . LEFT HEART CATHETERIZATION WITH CORONARY ANGIOGRAM N/A 06/17/2014   Procedure: LEFT HEART CATHETERIZATION WITH CORONARY ANGIOGRAM;  Surgeon: David Blanks, MD;  Location[facility] C[facility] LAB;  Service: Cardiovascular;  Laterality: N/A;  . PARTIAL COLECTOMY  02/29/2012   Procedure: PARTIAL COLECTOMY;  Surgeon: David Hector, MD;  Location: [facility]S;  Service: General;  Laterality: Right;  . POLYPECTOMY  11/10/2017   Procedure: POLYPECTOMY;  Surgeon: David Dolin, MD;  Location[facility];  Service: Endoscopy;;  gastric colon  . PROSTATECTOMY    . TONSILLECTOMY    . TRANSURETHRAL RESECTION OF PROSTATE    . VASECTOMY       SOCIAL HISTORY:  Social History   Socioeconomic History  . Marital status: Married    Spouse name: Not on file  . Number of children: 3  . 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 Highest education level: Not on file  Occupational History  . 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 Smoking status: Never Smoker  . Smokeless tobacco: Never Used  Substance and Sexual Activity  . Alcohol use: Yes    Comment: 1 cocktail/month  . Drug use: No  . Sexual activity: Not on file  Other Topics Concern  .  Not on file  Social History Narrative   Right handed   Caffeine~1 cup per day    Lives at home with wife    Social Determinants of Health   Financial Resource Strain:   . Difficulty of Paying Living Expenses:   Food Insecurity:   . Worried About Charity fundraiser in the Last Year:   . Arboriculturist in the Last Year:   Transportation Needs:   . Film/video editor (Medical):   Marland Kitchen Lack of Transportation (Non-Medical):   Physical Activity:   . Days of Exercise per Week:   . Minutes of Exercise per Session:   Stress:   . Feeling of Stress :   Social Connections:   . Frequency of Communication with Friends and Family:   . Frequency of Social Gatherings with Friends and Family:   . Attends  Religious Services:   . Active Member of Clubs or Organizations:   . Attends Archivist Meetings:   Marland Kitchen Marital Status:   Intimate Partner Violence:   . Fear of Current or Ex-Partner:   . Emotionally Abused:   Marland Kitchen Physically Abused:   . Sexually Abused:     FAMILY HISTORY:  Family History  Problem Relation Age of Onset  . Throat cancer Father        dx in his 64s and again in his 75s; pipe and cigar smoker  . Neuropathy Brother   . Prostate cancer Brother 44  . Melanoma Brother        dx in his 76s  . Cancer Paternal Uncle        smoking related cancer  . Heart attack Paternal Grandfather   . Stomach cancer Cousin 62  . Colon cancer Neg Hx     CURRENT MEDICATIONS:  Outpatient Encounter Medications as of 01/09/2020  Medication Sig Note  . aspirin EC 81 MG tablet Take 81 mg by mouth daily.   . B Complex Vitamins (VITAMIN B COMPLEX PO) Take 1 tablet by mouth at bedtime.    . cholecalciferol (VITAMIN D) 400 units TABS tablet Take 400 Units by mouth daily.   . citalopram (CELEXA) 20 MG tablet Take 10 mg by mouth daily.    . clopidogrel (PLAVIX) 75 MG tablet TAKE 1 TABLET BY MOUTH ONCE DAILY.   . fluticasone (FLONASE) 50 MCG/ACT nasal spray Place 1 spray into both nostrils at bedtime.   . folic acid (FOLVITE) 1 MG tablet TAKE (1) TABLET BY MOUTH ONCE DAILY . (Patient taking differently: Take 1 mg by mouth once a week. )   . latanoprost (XALATAN) 0.005 % ophthalmic solution 1 DROP IN [facility] EYE AT BEDTIME.   Marland Kitchen losartan (COZAAR) 50 MG tablet Take 25 mg by mouth 2 (two) times daily.    . metoprolol (LOPRESSOR) 50 MG tablet Take 50 mg by mouth 2 (two) times daily.    . montelukast (SINGULAIR) 10 MG tablet Take 10 mg by mouth at bedtime.   . Multiple Vitamin (MULTIVITAMIN WITH MINERALS) TABS tablet Take 1 tablet by mouth daily.   Marland Kitchen ofloxacin (OCUFLOX) 0.3 % ophthalmic solution Place 1 drop into both eyes as needed.    . pantoprazole (PROTONIX) 40 MG tablet Take 40 mg by mouth  daily.   . pravastatin (PRAVACHOL) 40 MG tablet TAKE 1 TABLET BY MOUTH AT BEDTIME.   . vitamin B-12 (CYANOCOBALAMIN) 1000 MCG tablet Take by mouth.   . Wheat Dextrin (BENEFIBER) POWD Take by mouth as needed. Daily in  the AM    . ALPRAZolam (XANAX) 0.5 MG tablet Take 0.5 mg by mouth daily as needed for anxiety. 10/26/2017: Primarily uses this when fly  . ibuprofen (ADVIL,MOTRIN) 200 MG tablet Take 400 mg by mouth daily as needed for headache or moderate pain.   Marland Kitchen loperamide (IMODIUM) 2 MG capsule Take 2 mg by mouth as needed for diarrhea or loose stools.   . nitroGLYCERIN (NITROSTAT) 0.4 MG SL tablet PLACE ONE (1) TABLET UNDER TONGUE EVERY 5 MINUTES UP TO (3) DOSES AS NEEDED FOR CHEST PAIN. (Patient not taking: Reported on 01/09/2020)   . [DISCONTINUED] hydrocortisone (ANUSOL-HC) 2.5 % rectal cream Place 1 application rectally 3 (three) times daily. (Patient not taking: Reported on 01/09/2020)    No facility-administered encounter medications on file as of 01/09/2020.    ALLERGIES:  Allergies  Allergen Reactions  . Shellfish Allergy Anaphylaxis  . Sulfonamide Derivatives Diarrhea and Nausea Only     PHYSICAL EXAM:  ECOG Performance status: 1  Vitals:   01/09/20 1020  BP: (!) 166/75  Pulse: 62  Resp: 19  Temp: (!) 97.1 F (36.2 C)  SpO2: 96%   Filed Weights   01/09/20 1020  Weight: 218 lb 6.4 oz (99.1 kg)   Physical Exam Constitutional:      Appearance: Normal appearance. He is normal weight.  Cardiovascular:     Rate and Rhythm: Normal rate and regular rhythm.     Heart sounds: Normal heart sounds.  Pulmonary:     Effort: Pulmonary effort is normal.     Breath sounds: Normal breath sounds.  Abdominal:     General: Bowel sounds are normal.     Palpations: Abdomen is soft.  Musculoskeletal:        General: Normal range of motion.  Skin:    General: Skin is warm.  Neurological:     Mental Status: He is alert and oriented to person, place, and time. Mental status is at  baseline.  Psychiatric:        Mood and Affect: Mood normal.        Behavior: Behavior normal.        Thought Content: Thought content normal.        Judgment: Judgment normal.      LABORATORY DATA:  I have reviewed the labs as listed.  CBC    Component Value Date/Time   WBC 16.3 (H) 01/03/2020 1413   RBC 4.65 01/03/2020 1413   HGB 14.1 01/03/2020 1413   HCT 44.5 01/03/2020 1413   PLT 373 01/03/2020 1413   MCV 95.7 01/03/2020 1413   MCH 30.3 01/03/2020 1413   MCHC 31.7 01/03/2020 1413   RDW 13.1 01/03/2020 1413   LYMPHSABS 7.1 (H) 01/03/2020 1413   MONOABS 1.5 (H) 01/03/2020 1413   EOSABS 0.5 01/03/2020 1413   BASOSABS 0.1 01/03/2020 1413   CMP Latest Ref Rng & Units 01/03/2020 06/20/2019 04/02/2019  Glucose 70 - 99 mg/dL 88 101(H) -  BUN 8 - 23 mg/dL 18 13 -  Creatinine 0.61 - 1.24 mg/dL 1.09 1.13 -  Sodium 135 - 145 mmol/L 140 143 -  Potassium 3.5 - 5.1 mmol/L 4.0 4.2 -  Chloride 98 - 111 mmol/L 107 109 -  CO2 22 - 32 mmol/L 26 26 -  Calcium 8.9 - 10.3 mg/dL 8.8(L) 8.8(L) -  Total Protein 6.5 - 8.1 g/dL 6.6 6.3(L) 6.4  Total Bilirubin 0.3 - 1.2 mg/dL 0.7 1.4(H) -  Alkaline Phos 38 - 126 U/L 54 56 -  AST 15 - 41 U/L 20 20 -  ALT 0 - 44 U/L 21 18 -    All questions were answered to patient's stated satisfaction. Encouraged patient to call with any new concerns or questions before his next visit to the cancer center and we can certain see him sooner, if needed.     ASSESSMENT & PLAN:  CLL (chronic lymphocytic leukemia) ([place]) 1.  Stage 0 CLL: - He was diagnosed by flow on 12/13/2014 - Today's examination did not reveal any palpable adenopathy or splenomegaly. -She denies any recurrent infections or hospitalizations. -She denies any fevers, chills, night sweats or unexplained weight loss.  No adenopathy. -Labs on 01/03/2020 showed his WBC at 16.3 hemoglobin was 14.1, and platelets were 373. -No indication for treatment at this time.  We will continue to monitor  labs. -We will see him back in 6 months for follow-up and repeat blood work and physical exam.  2.  Stage III ascending colon cancer: - He had 2 out of 20 lymph nodes positive. -Status post right hemicolectomy on 02/29/2012, could not tolerate adjuvant Xeloda. - EGD showed multiple benign gastric polyps, otherwise within normal limits. -He is completed 5 years of surveillance.  No more scans needed at this time. -He had a colonoscopy on 11/10/2017 which showed radiation proctitis changes.  Diverticulosis in the sigmoid colon.  One polyp in the ascending colon which was removed - Labs on 01/03/2020 showed his CEA level at 2.0 - We will continue to follow his CEA levels and do scans if disease warrants.  3.  Prostate cancer: -Underwent radiation therapy at [facility] in 2011. -He also underwent a TURP x2. - Labs on 01/03/2020 showed his PSA level at 0.08 -He follows up with Dr. Nevada Crane his urologist in [facility]. -We will continue to monitor PSA levels.     Orders placed this encounter:  Orders Placed This Encounter  Procedures  . Lactate dehydrogenase  . CBC with Differential/Platelet  . Comprehensive metabolic panel  . Ferritin  . Iron and TIBC  . Vitamin B12  . VITAMIN D 25 Hydroxy (Vit-D Deficiency, Fractures)  . Folate  . CEA  . PSA      David Finders, FNP-C [place] [phone]

## 2020-01-09 NOTE — Patient Instructions (Signed)
[facility] Discharge Instructions  Follow up in 6 months with labs    Thank you for choosing [facility] to provide your oncology and hematology care.  To afford each patient quality time with our provider, please arrive at least 15 minutes before your scheduled appointment time.   If you have a lab appointment with the Cancer Center please come in thru the Main Entrance and check in at the main information desk.  You need to re-schedule your appointment should you arrive 10 or more minutes late.  We strive to give you quality time with our providers, and arriving late affects you and other patients whose appointments are after yours.  Also, if you no show three or more times for appointments you may be dismissed from the clinic at the providers discretion.     Again, thank you for choosing [facility].  Our hope is that these requests will decrease the amount of time that you wait before being seen by our physicians.       _____________________________________________________________  Should you have questions after your visit to [facility], please contact our office at [phone] between the hours of 8:00 a.m. and 4:30 p.m.  Voicemails left after 4:00 p.m. will not be returned until the following business day.  For prescription refill requests, have your pharmacy contact our office and allow 72 hours.    Due to Covid, you will need to wear a mask upon entering the hospital. If you do not have a mask, a mask will be given to you at the Main Entrance upon arrival. For doctor visits, patients may have 1 support person with them. For treatment visits, patients can not have anyone with them due to social distancing guidelines and our immunocompromised population.      

## 2020-01-10 ENCOUNTER — Ambulatory Visit (HOSPITAL_COMMUNITY): Payer: Medicare Other | Admitting: Nurse Practitioner

## 2020-01-10 ENCOUNTER — Other Ambulatory Visit (INDEPENDENT_AMBULATORY_CARE_PROVIDER_SITE_OTHER): Payer: Self-pay | Admitting: Ophthalmology

## 2020-01-11 ENCOUNTER — Other Ambulatory Visit (HOSPITAL_COMMUNITY): Payer: Self-pay | Admitting: *Deleted

## 2020-01-11 ENCOUNTER — Telehealth (HOSPITAL_COMMUNITY): Payer: Self-pay | Admitting: *Deleted

## 2020-01-11 MED ORDER — CHOLECALCIFEROL 10 MCG (400 UNIT) PO TABS: 400.0000 [IU] | ORAL_TABLET | Freq: Every day | ORAL | 5 refills | Status: DC

## 2020-01-15 ENCOUNTER — Telehealth (INDEPENDENT_AMBULATORY_CARE_PROVIDER_SITE_OTHER): Payer: Self-pay

## 2020-01-15 NOTE — Telephone Encounter (Signed)
Pt needed refills for Latanaprost .005%.   Per Dr. Zadie Rhine I called in 12 refills for Latanaprost .005% , 1 gtt nightly OU , at [facility] in [place], [place].  Pt has upcoming appt on 01/21/20 with Dr. Zadie Rhine.

## 2020-01-21 ENCOUNTER — Encounter (INDEPENDENT_AMBULATORY_CARE_PROVIDER_SITE_OTHER): Payer: Self-pay | Admitting: Ophthalmology

## 2020-01-21 ENCOUNTER — Other Ambulatory Visit: Payer: Self-pay

## 2020-01-21 ENCOUNTER — Ambulatory Visit (INDEPENDENT_AMBULATORY_CARE_PROVIDER_SITE_OTHER): Payer: Medicare PPO | Admitting: Ophthalmology

## 2020-01-21 DIAGNOSIS — H353111 Nonexudative age-related macular degeneration, right eye, early dry stage: Secondary | ICD-10-CM

## 2020-01-21 DIAGNOSIS — H401131 Primary open-angle glaucoma, bilateral, mild stage: Secondary | ICD-10-CM | POA: Insufficient documentation

## 2020-01-21 DIAGNOSIS — H353124 Nonexudative age-related macular degeneration, left eye, advanced atrophic with subfoveal involvement: Secondary | ICD-10-CM | POA: Insufficient documentation

## 2020-01-21 DIAGNOSIS — H35371 Puckering of macula, right eye: Secondary | ICD-10-CM | POA: Diagnosis not present

## 2020-01-21 DIAGNOSIS — H35352 Cystoid macular degeneration, left eye: Secondary | ICD-10-CM | POA: Diagnosis not present

## 2020-01-21 DIAGNOSIS — H348122 Central retinal vein occlusion, left eye, stable: Secondary | ICD-10-CM

## 2020-01-21 MED ORDER — LATANOPROST 0.005 % OP SOLN: 1.0000 [drp] | Freq: Every day | OPHTHALMIC | 3 refills | Status: DC

## 2020-01-21 NOTE — Progress Notes (Signed)
01/21/2020     CHIEF COMPLAINT Patient presents for Retina Follow Up   HISTORY OF PRESENT ILLNESS: David Pugh. is a 85 y.o. male who presents to the clinic today for:   HPI    Retina Follow Up    Patient presents with  CRVO/BRVO.  In both eyes.  Severity is moderate.  Duration of 1 year.  Since onset it is stable.  I, the attending physician,  performed the HPI with the patient and updated documentation appropriately.          Comments    1 Year f\u OU. OCT  Pt states vision is stable. Pt sees floaters in OS. Using gtts as directed.        Last edited by Tilda Franco on 01/21/2020 10:21 AM. (History)      Referring physician: Charlsie Merles, MD [phone] [place],  [place] [address]  HISTORICAL INFORMATION:   Selected notes from the MEDICAL RECORD NUMBER    Lab Results  Component Value Date   HGBA1C 5.9 (H) 11/25/2013     CURRENT MEDICATIONS: Current Outpatient Medications (Ophthalmic Drugs)  Medication Sig  . latanoprost (XALATAN) 0.005 % ophthalmic solution 1 DROP IN [facility] EYE AT BEDTIME.  Marland Kitchen ofloxacin (OCUFLOX) 0.3 % ophthalmic solution Place 1 drop into both eyes as needed.    No current facility-administered medications for this visit. (Ophthalmic Drugs)   Current Outpatient Medications (Other)  Medication Sig  . ALPRAZolam (XANAX) 0.5 MG tablet Take 0.5 mg by mouth daily as needed for anxiety.  Marland Kitchen aspirin EC 81 MG tablet Take 81 mg by mouth daily.  . B Complex Vitamins (VITAMIN B COMPLEX PO) Take 1 tablet by mouth at bedtime.   . cholecalciferol (VITAMIN D3) 10 MCG (400 UNIT) TABS tablet Take 1 tablet (400 Units total) by mouth daily.  . citalopram (CELEXA) 20 MG tablet Take 10 mg by mouth daily.   . clopidogrel (PLAVIX) 75 MG tablet TAKE 1 TABLET BY MOUTH ONCE DAILY.  . fluticasone (FLONASE) 50 MCG/ACT nasal spray Place 1 spray into both nostrils at bedtime.  . folic acid (FOLVITE) 1 MG tablet TAKE (1) TABLET BY MOUTH  ONCE DAILY . (Patient taking differently: Take 1 mg by mouth once a week. )  . ibuprofen (ADVIL,MOTRIN) 200 MG tablet Take 400 mg by mouth daily as needed for headache or moderate pain.  Marland Kitchen loperamide (IMODIUM) 2 MG capsule Take 2 mg by mouth as needed for diarrhea or loose stools.  Marland Kitchen losartan (COZAAR) 50 MG tablet Take 25 mg by mouth 2 (two) times daily.   . metoprolol (LOPRESSOR) 50 MG tablet Take 50 mg by mouth 2 (two) times daily.   . montelukast (SINGULAIR) 10 MG tablet Take 10 mg by mouth at bedtime.  . Multiple Vitamin (MULTIVITAMIN WITH MINERALS) TABS tablet Take 1 tablet by mouth daily.  . nitroGLYCERIN (NITROSTAT) 0.4 MG SL tablet PLACE ONE (1) TABLET UNDER TONGUE EVERY 5 MINUTES UP TO (3) DOSES AS NEEDED FOR CHEST PAIN. (Patient not taking: Reported on 01/09/2020)  . pantoprazole (PROTONIX) 40 MG tablet Take 40 mg by mouth daily.  . pravastatin (PRAVACHOL) 40 MG tablet TAKE 1 TABLET BY MOUTH AT BEDTIME.  . vitamin B-12 (CYANOCOBALAMIN) 1000 MCG tablet Take by mouth.  . Wheat Dextrin (BENEFIBER) POWD Take by mouth as needed. Daily in the AM    No current facility-administered medications for this visit. (Other)      REVIEW OF SYSTEMS:  ALLERGIES Allergies  Allergen Reactions  . Shellfish Allergy Anaphylaxis  . Sulfonamide Derivatives Diarrhea and Nausea Only    PAST MEDICAL HISTORY Past Medical History:  Diagnosis Date  . Abnormality of gait 12/18/2013  . Arthritis   . Bilateral foot-drop 04/02/2019  . Biliary dyskinesia   . CAD (coronary artery disease) 2012   a. DES x2 in ramus/OM and mid LAD in 2012  b. balloon angioplaty of OM 2015  . CLL (chronic lymphocytic leukemia) ([place])   . Colon cancer [facility]) 2013   Right hemicolectomy, did not tolerate chemotherapy  . Colon polyps    tubular adenomas  . Diverticula of colon 2010  . Essential hypertension   . Exposure to [employer]   . Folic acid deficiency   . Gastroparesis 2014  . GERD (gastroesophageal reflux  disease)   . Glaucoma   . Heart palpitations 2014  . Hiatal hernia 2010  . History of anemia as a child   . History of kidney stones   . Iron deficiency 2013  . Myocardial infarction ([place]) 2012  . Peripheral neuropathy   . Prostate cancer [facility]) 2010   Radiation, surgery  . PTSD (post-traumatic stress disorder)   . Radiation proctitis   . Rosacea    Past Surgical History:  Procedure Laterality Date  . CARDIAC CATHETERIZATION  02/13/2011   [facility]  . CATARACT EXTRACTION     2011  . CATARACT EXTRACTION W/PHACO Right 08/06/2013   Procedure: CATARACT EXTRACTION PHACO AND INTRAOCULAR LENS PLACEMENT (IOC);  Surgeon: Tonny Branch, MD;  Location: [facility] ORS;  Service: Ophthalmology;  Laterality: Right;  CDE:20.96  . CHOLECYSTECTOMY    . COLONOSCOPY  02/12/2004   Dr. Gala Romney- L side diverticula, inflammatory  colon polyps  . COLONOSCOPY  01/19/2012   RMR: Prominent changes involving the rectal mucosa consistent with radiation-induced proctitis. Multiple colonic  polyps removed as described above. Sigmoid Diverticulosis/ 1.5 x 2 cm relatively flat ulcerated lesion in cecum/path showed adenocarcinoma of the colon. descending colon polyp with tubular adenoma and one fragment of focal high grade dysplasia.   . COLONOSCOPY  08/28/2012   [id number] proctitis. Colonic diverticulosis/Ulcerations at the surgical anastomosis  path: ulcerated colonic mucosa with prolapse changes, tubular adenoma.   . COLONOSCOPY N/A 08/02/2013   RMR: Colonic polyps -removed as described above. Colonic diverticulosis. Status post reight hemicolectomy. Radiation proctitis- asymptomatic.   Marland Kitchen COLONOSCOPY N/A 05/09/2015   Procedure: COLONOSCOPY;  Surgeon: Daneil Dolin, MD;  Location: [facility] END[facility] SU[facility];  Service: Endoscopy;  Laterality: N/A;  1200-moved to 1145 [place] to notify pt  . COLONOSCOPY WITH PROPOFOL N/A 11/10/2017   Procedure: COLONOSCOPY WITH PROPOFOL;  Surgeon: Daneil Dolin, MD;  Location: [facility];   Service: Endoscopy;  Laterality: N/A;  . CORONARY ANGIOPLASTY  06/17/14   OM1 POBA  . CORONARY STENT PLACEMENT  01/2011   2.5x64mm Xience drug-eluting stent in ramus intermedius/OMI vessel, 2.5x52mm Promus Element stent in mid LAD artery  . ESOPHAGOGASTRODUODENOSCOPY  10/10/2008   Dr. Tilda Burrow hiatal hernia, normal esophagus, normal stomach  . ESOPHAGOGASTRODUODENOSCOPY  02/12/2004   Dr. Dudley Major- normal   . ESOPHAGOGASTRODUODENOSCOPY N/A 05/09/2015   Procedure: ESOPHAGOGASTRODUODENOSCOPY (EGD);  Surgeon: Daneil Dolin, MD;  [facility];  Service: Endoscopy;  Laterality: N/A;  . ESOPHAGOGASTRODUODENOSCOPY (EGD) WITH PROPOFOL N/A 11/10/2017   Procedure: ESOPHAGOGASTRODUODENOSCOPY (EGD) WITH PROPOFOL;  Surgeon: Daneil Dolin, MD;  Location: [facility];  Service: Endoscopy;  Laterality: N/A;  9:45am  . GIVENS CAPSULE STUDY  N/A 06/03/2015   Procedure: GIVENS CAPSULE STUDY;  Surgeon: Daneil Dolin, MD;  Location: [facility];  Service: Endoscopy;  Laterality: N/A;  0700  . HOT HEMOSTASIS  11/10/2017   Procedure: HOT HEMOSTASIS (ARGON PLASMA COAGULATION/BICAP);  Surgeon: Daneil Dolin, MD;  Location: [facility];  Service: Endoscopy;;  rectum  . KNEE SURGERY     left knee   . LEFT HEART CATHETERIZATION WITH CORONARY ANGIOGRAM N/A 06/17/2014   Procedure: LEFT HEART CATHETERIZATION WITH CORONARY ANGIOGRAM;  Surgeon: Burnell Blanks, MD;  Location: [facility] CATH LAB;  Service: Cardiovascular;  Laterality: N/A;  . PARTIAL COLECTOMY  02/29/2012   Procedure: PARTIAL COLECTOMY;  Surgeon: Adin Hector, MD;  Location: [facility]S;  Service: General;  Laterality: Right;  . POLYPECTOMY  11/10/2017   Procedure: POLYPECTOMY;  Surgeon: Daneil Dolin, MD;  Location: [facility];  Service: Endoscopy;;  gastric colon  . PROSTATECTOMY    . TONSILLECTOMY    . TRANSURETHRAL RESECTION OF PROSTATE    . VASECTOMY      FAMILY HISTORY Family History  Problem Relation Age of Onset  . Throat  cancer Father        dx in his 57s and again in his 56s; pipe and cigar smoker  . Neuropathy Brother   . Prostate cancer Brother 40  . Melanoma Brother        dx in his 60s  . Cancer Paternal Uncle        smoking related cancer  . Heart attack Paternal Grandfather   . Stomach cancer Cousin 85  . Colon cancer Neg Hx     SOCIAL HISTORY Social History   Tobacco Use  . Smoking status: Never Smoker  . Smokeless tobacco: Never Used  Substance Use Topics  . Alcohol use: Yes    Comment: 1 cocktail/month  . Drug use: No         OPHTHALMIC EXAM: Base Eye Exam    Visual Acuity (Snellen - Linear)      Right Left   Dist [place] 20/20 -1 E Card @ 3'       Tonometry (Tonopen, 10:25 AM)      Right Left   Pressure 8 13       Pupils      Pupils Dark Light Shape React APD   Right PERRL 3 2.5 Round Slow None   Left PERRL 3 2.5 Round Slow None       Visual Fields (Counting fingers)      Left Right    Full Full       Neuro/Psych    Mood/Affect: Normal       Dilation    Both eyes: 1.0% Mydriacyl, 2.5% Phenylephrine @ 10:25 AM        Slit Lamp and Fundus Exam    Slit Lamp Exam      Right Left   Lens Posterior chamber intraocular lens Posterior chamber intraocular lens          IMAGING AND PROCEDURES  Imaging and Procedures for 01/21/20  OCT, Retina - OU - Both Eyes       Right Eye Quality was good. Scan locations included subfoveal. Central Foveal Thickness: 283.   Left Eye Quality was good. Scan locations included subfoveal. Central Foveal Thickness: 200.                 ASSESSMENT/PLAN:  No problem-specific Assessment & Plan notes found for this encounter.  ICD-10-CM   1. Central retinal vein occlusion of left eye, unspecified complication status  99991111 OCT, Retina - OU - Both Eyes  2. Early stage nonexudative age-related macular degeneration of right eye  H35.3111   3. Right epiretinal membrane  H35.371 OCT, Retina - OU - Both Eyes  4.  Cystoid macular edema of left eye  H35.352     1.  2.  3.  Ophthalmic Meds Ordered this visit:  No orders of the defined types were placed in this encounter.      No follow-ups on file.  There are no Patient Instructions on file for this visit.   Explained the diagnoses, plan, and follow up with the patient and they expressed understanding.  Patient expressed understanding of the importance of proper follow up care.   Clent Demark Jolan Upchurch M.D. Diseases & Surgery of the Retina and Vitreous Retina [facility] Diabetic [place] 01/21/20     Abbreviations: M myopia (nearsighted); A astigmatism; H hyperopia (farsighted); P presbyopia; Mrx spectacle prescription;  CTL contact lenses; OD right eye; OS left eye; OU both eyes  XT exotropia; ET esotropia; PEK punctate epithelial keratitis; PEE punctate epithelial erosions; DES dry eye syndrome; MGD meibomian gland dysfunction; ATs artificial tears; PFAT's preservative free artificial tears; [place] nuclear sclerotic cataract; PSC posterior subcapsular cataract; ERM epi-retinal membrane; PVD posterior vitreous detachment; RD retinal detachment; DM diabetes mellitus; DR diabetic retinopathy; NPDR non-proliferative diabetic retinopathy; PDR proliferative diabetic retinopathy; CSME clinically significant macular edema; DME diabetic macular edema; dbh dot blot hemorrhages; CWS cotton wool spot; POAG primary open angle glaucoma; C/D cup-to-disc ratio; HVF humphrey visual field; GVF goldmann visual field; OCT optical coherence tomography; IOP intraocular pressure; BRVO Branch retinal vein occlusion; CRVO central retinal vein occlusion; CRAO central retinal artery occlusion; BRAO branch retinal artery occlusion; RT retinal tear; SB scleral buckle; PPV pars plana vitrectomy; VH Vitreous hemorrhage; PRP panretinal laser photocoagulation; IVK intravitreal kenalog; VMT vitreomacular traction; MH Macular hole;  NVD neovascularization of the disc; NVE neovascularization  elsewhere; AREDS age related eye disease study; ARMD age related macular degeneration; POAG primary open angle glaucoma; EBMD epithelial/anterior basement membrane dystrophy; ACIOL anterior chamber intraocular lens; IOL intraocular lens; PCIOL posterior chamber intraocular lens; Phaco/IOL phacoemulsification with intraocular lens placement; [place] photorefractive keratectomy; LASIK laser assisted in situ keratomileusis; HTN hypertension; DM diabetes mellitus; COPD chronic obstructive pulmonary disease

## 2020-02-21 ENCOUNTER — Other Ambulatory Visit: Payer: Self-pay

## 2020-02-21 ENCOUNTER — Encounter: Payer: Self-pay | Admitting: Cardiology

## 2020-02-21 ENCOUNTER — Ambulatory Visit (INDEPENDENT_AMBULATORY_CARE_PROVIDER_SITE_OTHER): Payer: Medicare PPO | Admitting: Cardiology

## 2020-02-21 VITALS — BP 138/78 | HR 56 | Ht 71.0 in | Wt 219.9 lb

## 2020-02-21 DIAGNOSIS — E782 Mixed hyperlipidemia: Secondary | ICD-10-CM | POA: Diagnosis not present

## 2020-02-21 DIAGNOSIS — I25119 Atherosclerotic heart disease of native coronary artery with unspecified angina pectoris: Secondary | ICD-10-CM

## 2020-02-21 MED ORDER — NITROGLYCERIN 0.4 MG SL SUBL: SUBLINGUAL_TABLET | SUBLINGUAL | 3 refills | Status: DC

## 2020-02-21 NOTE — Progress Notes (Signed)
Cardiology Office Note  Date: 02/21/2020   ID: David Ades., DOB 05-Jan-1935, MRN [id number]  PCP:  David Merles, MD  Cardiologist:  David Lesches, MD Electrophysiologist:  None   Chief Complaint  Patient presents with  . Cardiac follow-up    History of Present Illness: David Pugh. is an 85 y.o. male last seen in December 2020. He presents for a routine visit.  He does not report any active angina symptoms at this time on medical therapy.  He and his wife are taking care of his 87 year old mother-in-law, still at home.  I reviewed his current cardiac medications which are outlined below.  He reports compliance and no obvious intolerances.  His LDL from last summer was aggressively controlled at 45.  He does need a refill for a fresh bottle of nitroglycerin.  Past Medical History:  Diagnosis Date  . Abnormality of gait 12/18/2013  . Arthritis   . Bilateral foot-drop 04/02/2019  . Biliary dyskinesia   . CAD (coronary artery disease) 2012   a. DES x2 in ramus/OM and mid LAD in 2012  b. balloon angioplaty of OM 2015  . CLL (chronic lymphocytic leukemia) ([place])   . Colon cancer [facility]) 2013   Right hemicolectomy, did not tolerate chemotherapy  . Colon polyps    tubular adenomas  . Diverticula of colon 2010  . Essential hypertension   . Exposure to [employer]   . Folic acid deficiency   . Gastroparesis 2014  . GERD (gastroesophageal reflux disease)   . Glaucoma   . Heart palpitations 2014  . Hiatal hernia 2010  . History of anemia as a child   . History of kidney stones   . Iron deficiency 2013  . Myocardial infarction ([place]) 2012  . Peripheral neuropathy   . Prostate cancer [facility]) 2010   Radiation, surgery  . PTSD (post-traumatic stress disorder)   . Radiation proctitis   . Rosacea     Past Surgical History:  Procedure Laterality Date  . CARDIAC CATHETERIZATION  02/13/2011   [facility] cardiology  . CATARACT EXTRACTION     2011  .  CATARACT EXTRACTION W/PHACO Right 08/06/2013   Procedure: CATARACT EXTRACTION PHACO AND INTRAOCULAR LENS PLACEMENT (IOC);  Surgeon: Tonny Branch, MD;  Location: [facility] ORS;  Service: Ophthalmology;  Laterality: Right;  CDE:20.96  . CHOLECYSTECTOMY    . COLONOSCOPY  02/12/2004   Dr. Gala Romney- L side diverticula, inflammatory  colon polyps  . COLONOSCOPY  01/19/2012   RMR: Prominent changes involving the rectal mucosa consistent with radiation-induced proctitis. Multiple colonic  polyps removed as described above. Sigmoid Diverticulosis/ 1.5 x 2 cm relatively flat ulcerated lesion in cecum/path showed adenocarcinoma of the colon. descending colon polyp with tubular adenoma and one fragment of focal high grade dysplasia.   . COLONOSCOPY  08/28/2012   PYK:[id number] proctitis. Colonic diverticulosis/Ulcerations at the surgical anastomosis  path: ulcerated colonic mucosa with prolapse changes, tubular adenoma.   . COLONOSCOPY N/A 08/02/2013   RMR: Colonic polyps -removed as described above. Colonic diverticulosis. Status post reight hemicolectomy. Radiation proctitis- asymptomatic.   Marland Kitchen COLONOSCOPY N/A 05/09/2015   Procedure: COLONOSCOPY;  Surgeon: Daneil Dolin, MD;  Location: [facility];  Service: Endoscopy;  Laterality: N/A;  1200-moved to [address]5 [address] to notify pt  . COLONOSCOPY WITH PROPOFOL N/A 11/10/2017   Procedure: COLONOSCOPY WITH PROPOFOL;  Surgeon: Daneil Dolin, MD;  Location: [facility];  Service: Endoscopy;  Laterality: N/A;  . CORONARY ANGIOPLASTY  06/17/14   OM1 POBA  . CORONARY STENT PLACEMENT  01/2011   2.5x30mm Xience drug-eluting stent in ramus intermedius/OMI vessel, 2.5x12mm Promus Element stent in mid LAD artery  . ESOPHAGOGASTRODUODENOSCOPY  10/10/2008   Dr. Tilda Burrow hiatal hernia, normal esophagus, normal stomach  . ESOPHAGOGASTRODUODENOSCOPY  02/12/2004   Dr. Dudley Major- normal   . ESOPHAGOGASTRODUODENOSCOPY N/A 05/09/2015   Procedure: ESOPHAGOGASTRODUODENOSCOPY (EGD);  Surgeon: Daneil Dolin, MD;  [facility]: [facility];  Service: Endoscopy;  Laterality: N/A;  . ESOPHAGOGASTRODUODENOSCOPY (EGD) WITH PROPOFOL N/A 11/10/2017   Procedure: ESOPHAGOGASTRODUODENOSCOPY (EGD) WITH PROPOFOL;  Surgeon: Daneil Dolin, MD;  Location: [facility];  Service: Endoscopy;  Laterality: N/A;  9:45am  . GIVENS CAPSULE STUDY N/A 06/03/2015   Procedure: GIVENS CAPSULE STUDY;  Surgeon: Daneil Dolin, MD;  Location: [facility];  Service: Endoscopy;  Laterality: N/A;  0700  . HOT HEMOSTASIS  11/10/2017   Procedure: HOT HEMOSTASIS (ARGON PLASMA COAGULATION/BICAP);  Surgeon: Daneil Dolin, MD;  Location: [facility];  Service: Endoscopy;;  rectum  . KNEE SURGERY     left knee   . LEFT HEART CATHETERIZATION WITH CORONARY ANGIOGRAM N/A 06/17/2014   Procedure: LEFT HEART CATHETERIZATION WITH CORONARY ANGIOGRAM;  Surgeon: Burnell Blanks, MD;  Location[facility] C[facility] LAB;  Service: Cardiovascular;  Laterality: N/A;  . PARTIAL COLECTOMY  02/29/2012   Procedure: PARTIAL COLECTOMY;  Surgeon: Adin Hector, MD;  Location: [facility];  Service: General;  Laterality: Right;  . POLYPECTOMY  11/10/2017   Procedure: POLYPECTOMY;  Surgeon: Daneil Dolin, MD;  Location: [facility];  Service: Endoscopy;;  gastric colon  . PROSTATECTOMY    . TONSILLECTOMY    . TRANSURETHRAL RESECTION OF PROSTATE    . VASECTOMY      Current Outpatient Medications  Medication Sig Dispense Refill  . ALPRAZolam (XANAX) 0.5 MG tablet Take 0.5 mg by mouth daily as needed for anxiety.    Marland Kitchen aspirin EC 81 MG tablet Take 81 mg by mouth daily.    . B Complex Vitamins (VITAMIN B COMPLEX PO) Take 1 tablet by mouth at bedtime.     . cholecalciferol (VITAMIN D3) 10 MCG (400 UNIT) TABS tablet Take 1 tablet (400 Units total) by mouth daily. 30 tablet 5  . citalopram (CELEXA) 20 MG tablet Take 10 mg by mouth daily.     . clopidogrel (PLAVIX) 75 MG tablet TAKE 1 TABLET BY MOUTH ONCE DAILY. 90 tablet 1  . fluticasone (FLONASE) 50  MCG/ACT nasal spray Place 1 spray into both nostrils at bedtime.    . folic acid (FOLVITE) 1 MG tablet TAKE (1) TABLET BY MOUTH ONCE DAILY . (Patient taking differently: Take 1 mg by mouth once a week. ) 30 tablet 0  . ibuprofen (ADVIL,MOTRIN) 200 MG tablet Take 400 mg by mouth daily as needed for headache or moderate pain.    Marland Kitchen latanoprost (XALATAN) 0.005 % ophthalmic solution Place 1 drop into both eyes at bedtime. 2.5 mL 3  . loperamide (IMODIUM) 2 MG capsule Take 2 mg by mouth as needed for diarrhea or loose stools.    Marland Kitchen losartan (COZAAR) 50 MG tablet Take 25 mg by mouth 2 (two) times daily.     . metoprolol (LOPRESSOR) 50 MG tablet Take 50 mg by mouth 2 (two) times daily.     . montelukast (SINGULAIR) 10 MG tablet Take 10 mg by mouth at bedtime.    . Multiple Vitamin (MULTIVITAMIN WITH MINERALS) TABS tablet Take 1  tablet by mouth daily.    . nitroGLYCERIN (NITROSTAT) 0.4 MG SL tablet PLACE ONE (1) TABLET UNDER TONGUE EVERY 5 MINUTES UP TO (3) DOSES AS NEEDED FOR CHEST PAIN. 25 tablet 3  . ofloxacin (OCUFLOX) 0.3 % ophthalmic solution Place 1 drop into both eyes as needed.     . pantoprazole (PROTONIX) 40 MG tablet Take 40 mg by mouth daily.    . pravastatin (PRAVACHOL) 40 MG tablet TAKE 1 TABLET BY MOUTH AT BEDTIME. 30 tablet 6  . vitamin B-12 (CYANOCOBALAMIN) 1000 MCG tablet Take by mouth.    . Wheat Dextrin (BENEFIBER) POWD Take by mouth as needed. Daily in the AM      No current facility-administered medications for this visit.   Allergies:  Shellfish allergy and Sulfonamide derivatives   ROS:  No palpitations or syncope.  Continued neuropathy symptoms.  Physical Exam: VS:  BP 138/78   Pulse (!) 56   Ht 5\' 11"  (1.803 m)   Wt 219 lb 14.4 oz (99.7 kg)   SpO2 97%   BMI 30.67 kg/m , BMI Body mass index is 30.67 kg/m.  Wt Readings from Last 3 Encounters:  02/21/20 219 lb 14.4 oz (99.7 kg)  01/09/20 218 lb 6.4 oz (99.1 kg)  10/10/19 219 lb (99.3 kg)    General:  Elderly male,  appears comfortable at rest. HEENT: Conjunctiva and lids normal, wearing a mask. Neck: Supple, no elevated JVP or carotid bruits, no thyromegaly. Lungs: Clear to auscultation, nonlabored breathing at rest. Cardiac: Regular rate and rhythm, no S3, soft systolic murmur, no pericardial rub. Extremities: No pitting edema, distal pulses 2+.  ECG:  An ECG dated 08/09/2019 was personally reviewed today and demonstrated:  Sinus bradycardia with Q-wave in lead III.  Recent Labwork: 01/03/2020: ALT 21; AST 20; BUN 18; Creatinine, Ser 1.09; Hemoglobin 14.1; Platelets 373; Potassium 4.0; Sodium 140  June 2020: Cholesterol 119, triglycerides 96, HDL 55, LDL 45  Other Studies Reviewed Today:  Cardiac catheterization and PCI 06/17/2014: PCI Note:He was given an additional 6000 units IV heparin. ACT was 235. He was then given an additional 3000 units IV heparin. ACT was over 280. I then engaged the left main with a XB 3.0 guiding catheter. I passed a Cougar IC wire down the Circumflex into the OM1. I then dilated the long segment of stenosis in OM1 with a 2.0 x 20 mm balloon x 2. I was unable to deliver a small stent despite attempting this with a buddy wire. I used a FineCross catheter to shuttle in a long Mailman wire but still could not deliver the stent. I was unable to successfully deliver a Guideliner for guide support. The case was terminated with an acceptable balloon angioplasty result. The stenosis was taken from 99% down to 30%. There was excellent flow down the vessel.   The sheath was removed from the right radial artery and a Terumo hemostasis band was applied at the arteriotomy site on the right wrist.   There were no immediate complications. The patient was taken to the recovery area in stable condition.   Hemodynamic Findings: Central aortic pressure: 125/76 Left ventricular pressure: 127/14/17  Angiographic Findings:  Left main: No obstructive disease.   Left Anterior Descending  Artery: Moderate to large caliber vessel that courses to the apex. The proximal vessel has mild disease. The mid vessel has diffuse 30% stenosis followed by a patent stented segment with no restenosis. The apical LAD becomes very small in caliber with diffuse 80% stenosis.  Small caliber bifurcating diagonal branch with diffuse 30% stenosis.   Circumflex Artery: Small to moderate caliber diffusely diseased vessel with proximal 30% stenosis. There is a small caliber (2.0) obtuse marginal branch with proximal 50% stenosis followed by 99% focal stenosis. The AV groove Circumflex is small in caliber beyond the OM branch and has mild plaque disease.   Intermediate Branch:Moderate caliber vessel with patent proximal stent with minimal restenosis.   Right Coronary Artery: Large dominant vessel with 30% proximal stenosis, diffuse 40% mid stenosis, 50% focal distal stenosis. The posterolateral branch is a small caliber vessel with 99% distal stenosis (too small for PCI).   Left Ventricular Angiogram: Deferred.   Impression: 1. Triple vessel CAD 2. Patent stents in the mid LAD and proximal intermediate branch 3. Severe stenosis in the small caliber OM branch, s/p balloon angioplasty (unable to deliver a stent due to takeoff of Circumflex from left main, size of the vessel and tortuosity)  Assessment and Plan:  1.  CAD status post DES x2 to the ramus intermedius/obtuse marginal system and mid LAD in 2012.  He underwent subsequent balloon angioplasty of the OM in 2015.  At this point reports no active angina on medical therapy, we are providing a refill for fresh bottle of nitroglycerin. Continue aspirin, Plavix, Cozaar, Lopressor, and Pravachol.  2.  Mixed hyperlipidemia, doing well on Pravachol with last LDL 45.  Medication Adjustments/Labs and Tests Ordered: Current medicines are reviewed at length with the patient today.  Concerns regarding medicines are outlined above.   Tests Ordered: No  orders of the defined types were placed in this encounter.   Medication Changes: Meds ordered this encounter  Medications  . nitroGLYCERIN (NITROSTAT) 0.4 MG SL tablet    Sig: PLACE ONE (1) TABLET UNDER TONGUE EVERY 5 MINUTES UP TO (3) DOSES AS NEEDED FOR CHEST PAIN.    Dispense:  25 tablet    Refill:  3    Disposition:  Follow up 6 months in the [place] office.  Signed, Satira Sark, MD, [facility] 02/21/2020 2:13 PM    [facility] [place], [place], [place] [address] Phone: [phone]; Fax: [phone]

## 2020-02-21 NOTE — Patient Instructions (Addendum)

## 2020-04-09 ENCOUNTER — Ambulatory Visit: Payer: TRICARE For Life (TFL) | Admitting: Neurology

## 2020-04-09 NOTE — Progress Notes (Deleted)
PATIENT: David Pugh. DOB: [birth year]  REASON FOR VISIT: follow up HISTORY FROM: patient  HISTORY OF PRESENT ILLNESS: Today 04/09/20  David Pugh is an 85 year old male with history of peripheral neuropathy.  He has no pain associated.  AFO braces were helpful, but resulted in knee pain.  HISTORY 10/10/2019 SS: David Pugh is an 85 year old male with history of peripheral neuropathy.  He has no pain associated with the neuropathy.  He was set up for physical therapy and given AFO braces.  Laboratory evaluation (B12, ANA, multiple myeloma panel, B burgdorferi) was unremarkable.  The AFO braces were helpful, but they resulted in knee pain, he was unable to tolerate wearing them.  He continues to report numbness in his feet up to mid shin.  Just this last week, he has had a few occasional sharp pains to the bottom of his feet, that are very brief.  They do not require medication, or prevent him from doing anything.  He did sustain a fall in October, while walking outside, he tripped, landed on his left side.  Had significant bruising.  Was hospitalized at [facility] for 3 days.  No abnormality was found.  He reports he sleeps well at night, symptoms do not keep him up.  He uses a cane for ambulation.  He uses an elliptical 5 days a week.  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  He did complete physical therapy for gait training, that was helpful for his balance.  He presents today for evaluation unaccompanied.   REVIEW OF SYSTEMS: Out of a complete 14 system review of symptoms, the patient complains only of the following symptoms, and all other reviewed systems are negative.  ALLERGIES: Allergies  Allergen Reactions  . Shellfish Allergy Anaphylaxis  . Sulfonamide Derivatives Diarrhea and Nausea Only    HOME MEDICATIONS: Outpatient Medications Prior to Visit  Medication Sig Dispense Refill  . ALPRAZolam (XANAX) 0.5 MG tablet Take 0.5 mg by mouth daily as needed  for anxiety.    Marland Kitchen aspirin EC 81 MG tablet Take 81 mg by mouth daily.    . B Complex Vitamins (VITAMIN B COMPLEX PO) Take 1 tablet by mouth at bedtime.     . cholecalciferol (VITAMIN D3) 10 MCG (400 UNIT) TABS tablet Take 1 tablet (400 Units total) by mouth daily. 30 tablet 5  . citalopram (CELEXA) 20 MG tablet Take 10 mg by mouth daily.     . clopidogrel (PLAVIX) 75 MG tablet TAKE 1 TABLET BY MOUTH ONCE DAILY. 90 tablet 1  . fluticasone (FLONASE) 50 MCG/ACT nasal spray Place 1 spray into both nostrils at bedtime.    . folic acid (FOLVITE) 1 MG tablet TAKE (1) TABLET BY MOUTH ONCE DAILY . (Patient taking differently: Take 1 mg by mouth once a week. ) 30 tablet 0  . ibuprofen (ADVIL,MOTRIN) 200 MG tablet Take 400 mg by mouth daily as needed for headache or moderate pain.    Marland Kitchen latanoprost (XALATAN) 0.005 % ophthalmic solution Place 1 drop into both eyes at bedtime. 2.5 mL 3  . loperamide (IMODIUM) 2 MG capsule Take 2 mg by mouth as needed for diarrhea or loose stools.    Marland Kitchen losartan (COZAAR) 50 MG tablet Take 25 mg by mouth 2 (two) times daily.     . metoprolol (LOPRESSOR) 50 MG tablet Take 50 mg by mouth 2 (two) times daily.     . montelukast (SINGULAIR) 10 MG tablet Take 10 mg  by mouth at bedtime.    . Multiple Vitamin (MULTIVITAMIN WITH MINERALS) TABS tablet Take 1 tablet by mouth daily.    . nitroGLYCERIN (NITROSTAT) 0.4 MG SL tablet PLACE ONE (1) TABLET UNDER TONGUE EVERY 5 MINUTES UP TO (3) DOSES AS NEEDED FOR CHEST PAIN. 25 tablet 3  . ofloxacin (OCUFLOX) 0.3 % ophthalmic solution Place 1 drop into both eyes as needed.     . pantoprazole (PROTONIX) 40 MG tablet Take 40 mg by mouth daily.    . pravastatin (PRAVACHOL) 40 MG tablet TAKE 1 TABLET BY MOUTH AT BEDTIME. 30 tablet 6  . vitamin B-12 (CYANOCOBALAMIN) 1000 MCG tablet Take by mouth.    . Wheat Dextrin (BENEFIBER) POWD Take by mouth as needed. Daily in the AM      No facility-administered medications prior to visit.    PAST MEDICAL  HISTORY: Past Medical History:  Diagnosis Date  . Abnormality of gait 12/18/2013  . Arthritis   . Bilateral foot-drop 04/02/2019  . Biliary dyskinesia   . CAD (coronary artery disease) 2012   a. DES x2 in ramus/OM and mid LAD in 2012  b. balloon angioplaty of OM 2015  . CLL (chronic lymphocytic leukemia) ([place])   . Colon cancer [facility]) 2013   Right hemicolectomy, did not tolerate chemotherapy  . Colon polyps    tubular adenomas  . Diverticula of colon 2010  . Essential hypertension   . Exposure to [employer]   . Folic acid deficiency   . Gastroparesis 2014  . GERD (gastroesophageal reflux disease)   . Glaucoma   . Heart palpitations 2014  . Hiatal hernia 2010  . History of anemia as a child   . History of kidney stones   . Iron deficiency 2013  . Myocardial infarction ([place]) 2012  . Peripheral neuropathy   . Prostate cancer [facility]) 2010   Radiation, surgery  . PTSD (post-traumatic stress disorder)   . Radiation proctitis   . Rosacea     PAST SURGICAL HISTORY: Past Surgical History:  Procedure Laterality Date  . CARDIAC CATHETERIZATION  02/13/2011   [facility], [facility] cardiology  . CATARACT EXTRACTION     2011  . CATARACT EXTRACTION W/PHACO Right 08/06/2013   Procedure: CATARACT EXTRACTION PHACO AND INTRAOCULAR LENS PLACEMENT (IOC);  Surgeon: Tonny Branch, MD;  Location: [facility];  Service: Ophthalmology;  Laterality: Right;  CDE:20.96  . CHOLECYSTECTOMY    . COLONOSCOPY  02/12/2004   Dr. Gala Romney- L side diverticula, inflammatory  colon polyps  . COLONOSCOPY  01/19/2012   RMR: Prominent changes involving the rectal mucosa consistent with radiation-induced proctitis. Multiple colonic  polyps removed as described above. Sigmoid Diverticulosis/ 1.5 x 2 cm relatively flat ulcerated lesion in cecum/path showed adenocarcinoma of the colon. descending colon polyp with tubular adenoma and one fragment of focal high grade dysplasia.   . COLONOSCOPY  08/28/2012   LOV:FIEPPIRJJ proctitis.  Colonic diverticulosis/Ulcerations at the surgical anastomosis  path: ulcerated colonic mucosa with prolapse changes, tubular adenoma.   . COLONOSCOPY N/A 08/02/2013   RMR: Colonic polyps -removed as described above. Colonic diverticulosis. Status post reight hemicolectomy. Radiation proctitis- asymptomatic.   Marland Kitchen COLONOSCOPY N/A 05/09/2015   Procedure: COLONOSCOPY;  Surgeon: Daneil Dolin, MD;  Location[facility];  Service: Endoscopy;  Laterality: N/A;  1200-moved to 1145 [address] to notify pt  . COLONOSCOPY WITH PROPOFOL N/A 11/10/2017   Procedure: COLONOSCOPY WITH PROPOFOL;  Surgeon: Daneil Dolin, MD;  Location: [facility];  Service: Endoscopy;  Laterality:  N/A;  . CORONARY ANGIOPLASTY  06/17/14   OM1 POBA  . CORONARY STENT PLACEMENT  01/2011   2.5x48m Xience drug-eluting stent in ramus intermedius/OMI vessel, 2.5x645mPromus Element stent in mid LAD artery  . ESOPHAGOGASTRODUODENOSCOPY  10/10/2008   Dr. RoTilda Burrowiatal hernia, normal esophagus, normal stomach  . ESOPHAGOGASTRODUODENOSCOPY  02/12/2004   Dr. RoDudley Majornormal   . ESOPHAGOGASTRODUODENOSCOPY N/A 05/09/2015   Procedure: ESOPHAGOGASTRODUODENOSCOPY (EGD);  Surgeon: RoDaneil DolinMD;  [facility];  Service: Endoscopy;  Laterality: N/A;  . ESOPHAGOGASTRODUODENOSCOPY (EGD) WITH PROPOFOL N/A 11/10/2017   Procedure: ESOPHAGOGASTRODUODENOSCOPY (EGD) WITH PROPOFOL;  Surgeon: RoDaneil DolinMD;  [facility]: [facility];  Service: Endoscopy;  Laterality: N/A;  9:45am  . GIVENS CAPSULE STUDY N/A 06/03/2015   Procedure: GIVENS CAPSULE STUDY;  Surgeon: RoDaneil DolinMD;  Location: [facility];  Service: Endoscopy;  Laterality: N/A;  0700  . HOT HEMOSTASIS  11/10/2017   Procedure: HOT HEMOSTASIS (ARGON PLASMA COAGULATION/BICAP);  Surgeon: RoDaneil DolinMD;  Location: [facility];  Service: Endoscopy;;  rectum  . KNEE SURGERY     left knee   . LEFT HEART CATHETERIZATION WITH CORONARY ANGIOGRAM N/A 06/17/2014   Procedure:  LEFT HEART CATHETERIZATION WITH CORONARY ANGIOGRAM;  Surgeon: ChBurnell BlanksMD;  [facility];  Service: Cardiovascular;  Laterality: N/A;  . PARTIAL COLECTOMY  02/29/2012   Procedure: PARTIAL COLECTOMY;  Surgeon: HaAdin HectorMD;  Location: [facility];  Service: General;  Laterality: Right;  . POLYPECTOMY  11/10/2017   Procedure: POLYPECTOMY;  Surgeon: RoDaneil DolinMD;  Location[facility];  Service: Endoscopy;;  gastric colon  . PROSTATECTOMY    . TONSILLECTOMY    . TRANSURETHRAL RESECTION OF PROSTATE    . VASECTOMY      FAMILY HISTORY: Family History  Problem Relation Age of Onset  . Throat cancer Father        dx in his 5068snd again in his 7049spipe and cigar smoker  . Neuropathy Brother   . Prostate cancer Brother [id number]. Melanoma Brother        dx in his 4094s. Cancer Paternal Uncle        smoking related cancer  . Heart attack Paternal Grandfather   . Stomach cancer Cousin [id number]. Colon cancer Neg Hx     SOCIAL HISTORY: Social History   Socioeconomic History  . Marital status: Married    Spouse name: Not on file  . Number of children: 3  . 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 Highest education level: Not on file  Occupational History  . 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 Smoking status: Never Smoker  . Smokeless tobacco: Never Used  Vaping Use  . Vaping Use: Never used  Substance and Sexual Activity  . Alcohol use: Not Currently    Comment: 1 cocktail/month  . Drug use: No  . Sexual activity: Not on file  Other Topics Concern  . Not on file  Social History Narrative   Right handed   Caffeine~1 cup per day    Lives at home with wife    Social Determinants of Health   Financial Resource Strain:   . Difficulty of Paying Living Expenses:   Food Insecurity:   . Worried About RuCharity fundraisern the Last Year:   . RaArboriculturistn the Last Year:   Transportation Needs:   .  Lack of Transportation (Medical):     Marland Kitchen Lack of Transportation (Non-Medical):   Physical Activity:   . Days of Exercise per Week:   . Minutes of Exercise per Session:   Stress:   . Feeling of Stress :   Social Connections:   . Frequency of Communication with Friends and Family:   . Frequency of Social Gatherings with Friends and Family:   . Attends Religious Services:   . Active Member of Clubs or Organizations:   . Attends Archivist Meetings:   Marland Kitchen Marital Status:   Intimate Partner Violence:   . Fear of Current or Ex-Partner:   . Emotionally Abused:   Marland Kitchen Physically Abused:   . Sexually Abused:       PHYSICAL EXAM  There were no vitals filed for this visit. There is no height or weight on file to calculate BMI.  Generalized: Well developed, in no acute distress   Neurological examination  Mentation: Alert oriented to time, place, history taking. Follows all commands speech and language fluent Cranial nerve II-XII: Pupils were equal round reactive to light. Extraocular movements were full, visual field were full on confrontational test. Facial sensation and strength were normal. Uvula tongue midline. Head turning and shoulder shrug  were normal and symmetric. Motor: The motor testing reveals 5 over 5 strength of all 4 extremities. Good symmetric motor tone is noted throughout.  Sensory: Sensory testing is intact to soft touch on all 4 extremities. No evidence of extinction is noted.  Coordination: Cerebellar testing reveals good finger-nose-finger and heel-to-shin bilaterally.  Gait and station: Gait is normal. Tandem gait is normal. Romberg is negative. No drift is seen.  Reflexes: Deep tendon reflexes are symmetric and normal bilaterally.   DIAGNOSTIC DATA (LABS, IMAGING, TESTING) - I reviewed patient records, labs, notes, testing and imaging myself where available.  Lab Results  Component Value Date   WBC 16.3 (H) 01/03/2020   HGB 14.1 01/03/2020   HCT 44.5 01/03/2020   MCV 95.7 01/03/2020    PLT 373 01/03/2020      Component Value Date/Time   NA 140 01/03/2020 1413   K 4.0 01/03/2020 1413   CL 107 01/03/2020 1413   CO2 26 01/03/2020 1413   GLUCOSE 88 01/03/2020 1413   BUN 18 01/03/2020 1413   CREATININE 1.09 01/03/2020 1413   CREATININE 1.20 (H) 05/05/2016 0840   CALCIUM 8.8 (L) 01/03/2020 1413   PROT 6.6 01/03/2020 1413   PROT 6.4 04/02/2019 1248   ALBUMIN 3.7 01/03/2020 1413   AST 20 01/03/2020 1413   ALT 21 01/03/2020 1413   ALKPHOS 54 01/03/2020 1413   BILITOT 0.7 01/03/2020 1413   GFRNONAA >60 01/03/2020 1413   GFRAA >60 01/03/2020 1413   Lab Results  Component Value Date   CHOL 133 04/27/2017   HDL 63 04/27/2017   LDLCALC 49 04/27/2017   TRIG 106 04/27/2017   CHOLHDL 2.1 04/27/2017   Lab Results  Component Value Date   HGBA1C 5.9 (H) 11/25/2013   Lab Results  Component Value Date   VITAMINB12 401 01/03/2020   Lab Results  Component Value Date   TSH 3.129 10/14/2012      ASSESSMENT AND PLAN 85 y.o. year old male  has a past medical history of Abnormality of gait (12/18/2013), Arthritis, Bilateral foot-drop (04/02/2019), Biliary dyskinesia, CAD (coronary artery disease) (2012), CLL (chronic lymphocytic leukemia) ([place]), Colon cancer ([place]) (2013), Colon polyps, Diverticula of colon (2010), Essential hypertension, Exposure to [employer], Folic acid  deficiency, Gastroparesis (2014), GERD (gastroesophageal reflux disease), Glaucoma, Heart palpitations (2014), Hiatal hernia (2010), History of anemia as a child, History of kidney stones, Iron deficiency (2013), Myocardial infarction ([place]) (2012), Peripheral neuropathy, Prostate cancer ([place]) (2010), PTSD (post-traumatic stress disorder), Radiation proctitis, and Rosacea. here with ***   I spent 15 minutes with the patient. 50% of this time was spent   Butler Denmark, [place], DNP 04/09/2020, 5:54 AM [facility] [address] [place], [place] [address] [phone]

## 2020-06-08 ENCOUNTER — Other Ambulatory Visit: Payer: Self-pay | Admitting: Cardiology

## 2020-06-25 ENCOUNTER — Ambulatory Visit: Payer: TRICARE For Life (TFL) | Admitting: Neurology

## 2020-06-25 ENCOUNTER — Encounter: Payer: Self-pay | Admitting: Neurology

## 2020-06-25 NOTE — Progress Notes (Deleted)
PATIENT: David Pugh. DOB: November 30, 1934  REASON FOR VISIT: follow up HISTORY FROM: patient  HISTORY OF PRESENT ILLNESS: Today 06/25/20 Mr. Campas is an 85 year old male with history of peripheral neuropathy.  He has no pain associated with the neuropathy.  He was set up for PT given AFO braces, the braces caused knee pain. HISTORY 10/10/2019 SS: Mr. Stearns is an 85 year old male with history of peripheral neuropathy.  He has no pain associated with the neuropathy.  He was set up for physical therapy and given AFO braces.  Laboratory evaluation (B12, ANA, multiple myeloma panel, B burgdorferi) was unremarkable.  The AFO braces were helpful, but they resulted in knee pain, he was unable to tolerate wearing them.  He continues to report numbness in his feet up to mid shin.  Just this last week, he has had a few occasional sharp pains to the bottom of his feet, that are very brief.  They do not require medication, or prevent him from doing anything.  He did sustain a fall in October, while walking outside, he tripped, landed on his left side.  Had significant bruising.  Was hospitalized at [facility] for 3 days.  No abnormality was found.  He reports he sleeps well at night, symptoms do not keep him up.  He uses a cane for ambulation.  He uses an elliptical 5 days a week.  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  He did complete physical therapy for gait training, that was helpful for his balance.  He presents today for evaluation unaccompanied.   REVIEW OF SYSTEMS: Out of a complete 14 system review of symptoms, the patient complains only of the following symptoms, and all other reviewed systems are negative.  ALLERGIES: Allergies  Allergen Reactions  . Shellfish Allergy Anaphylaxis  . Sulfonamide Derivatives Diarrhea and Nausea Only    HOME MEDICATIONS: Outpatient Medications Prior to Visit  Medication Sig Dispense Refill  . ALPRAZolam (XANAX) 0.5 MG tablet Take  0.5 mg by mouth daily as needed for anxiety.    Marland Kitchen aspirin EC 81 MG tablet Take 81 mg by mouth daily.    . B Complex Vitamins (VITAMIN B COMPLEX PO) Take 1 tablet by mouth at bedtime.     . cholecalciferol (VITAMIN D3) 10 MCG (400 UNIT) TABS tablet Take 1 tablet (400 Units total) by mouth daily. 30 tablet 5  . citalopram (CELEXA) 20 MG tablet Take 10 mg by mouth daily.     . clopidogrel (PLAVIX) 75 MG tablet TAKE 1 TABLET BY MOUTH ONCE DAILY. 30 tablet 11  . fluticasone (FLONASE) 50 MCG/ACT nasal spray Place 1 spray into both nostrils at bedtime.    . folic acid (FOLVITE) 1 MG tablet TAKE (1) TABLET BY MOUTH ONCE DAILY . (Patient taking differently: Take 1 mg by mouth once a week. ) 30 tablet 0  . ibuprofen (ADVIL,MOTRIN) 200 MG tablet Take 400 mg by mouth daily as needed for headache or moderate pain.    Marland Kitchen latanoprost (XALATAN) 0.005 % ophthalmic solution Place 1 drop into both eyes at bedtime. 2.5 mL 3  . loperamide (IMODIUM) 2 MG capsule Take 2 mg by mouth as needed for diarrhea or loose stools.    Marland Kitchen losartan (COZAAR) 50 MG tablet Take 25 mg by mouth 2 (two) times daily.     . metoprolol (LOPRESSOR) 50 MG tablet Take 50 mg by mouth 2 (two) times daily.     . montelukast (SINGULAIR)  10 MG tablet Take 10 mg by mouth at bedtime.    . Multiple Vitamin (MULTIVITAMIN WITH MINERALS) TABS tablet Take 1 tablet by mouth daily.    . nitroGLYCERIN (NITROSTAT) 0.4 MG SL tablet PLACE ONE (1) TABLET UNDER TONGUE EVERY 5 MINUTES UP TO (3) DOSES AS NEEDED FOR CHEST PAIN. 25 tablet 3  . ofloxacin (OCUFLOX) 0.3 % ophthalmic solution Place 1 drop into both eyes as needed.     . pantoprazole (PROTONIX) 40 MG tablet Take 40 mg by mouth daily.    . pravastatin (PRAVACHOL) 40 MG tablet TAKE 1 TABLET BY MOUTH AT BEDTIME. 30 tablet 6  . vitamin B-12 (CYANOCOBALAMIN) 1000 MCG tablet Take by mouth.    . Wheat Dextrin (BENEFIBER) POWD Take by mouth as needed. Daily in the AM      No facility-administered medications  prior to visit.    PAST MEDICAL HISTORY: Past Medical History:  Diagnosis Date  . Abnormality of gait 12/18/2013  . Arthritis   . Bilateral foot-drop 04/02/2019  . Biliary dyskinesia   . CAD (coronary artery disease) 2012   a. DES x2 in ramus/OM and mid LAD in 2012  b. balloon angioplaty of OM 2015  . CLL (chronic lymphocytic leukemia) ([place])   . Colon cancer [facility]) 2013   Right hemicolectomy, did not tolerate chemotherapy  . Colon polyps    tubular adenomas  . Diverticula of colon 2010  . Essential hypertension   . Exposure to [employer]   . Folic acid deficiency   . Gastroparesis 2014  . GERD (gastroesophageal reflux disease)   . Glaucoma   . Heart palpitations 2014  . Hiatal hernia 2010  . History of anemia as a child   . History of kidney stones   . Iron deficiency 2013  . Myocardial infarction ([place]) 2012  . Peripheral neuropathy   . Prostate cancer [facility]) 2010   Radiation, surgery  . PTSD (post-traumatic stress disorder)   . Radiation proctitis   . Rosacea     PAST SURGICAL HISTORY: Past Surgical History:  Procedure Laterality Date  . CARDIAC CATHETERIZATION  02/13/2011   [facility] cardiology  . CATARACT EXTRACTION     2011  . CATARACT EXTRACTION W/PHACO Right 08/06/2013   Procedure: CATARACT EXTRACTION PHACO AND INTRAOCULAR LENS PLACEMENT (IOC);  Surgeon: Tonny Branch, MD;  Location: [facility] ORS;  Service: Ophthalmology;  Laterality: Right;  CDE:20.96  . CHOLECYSTECTOMY    . COLONOSCOPY  02/12/2004   Dr. Gala Romney- L side diverticula, inflammatory  colon polyps  . COLONOSCOPY  01/19/2012   RMR: Prominent changes involving the rectal mucosa consistent with radiation-induced proctitis. Multiple colonic  polyps removed as described above. Sigmoid Diverticulosis/ 1.5 x 2 cm relatively flat ulcerated lesion in cecum/path showed adenocarcinoma of the colon. descending colon polyp with tubular adenoma and one fragment of focal high grade dysplasia.   . COLONOSCOPY  08/28/2012    PIR:JJOACZYSA proctitis. Colonic diverticulosis/Ulcerations at the surgical anastomosis  path: ulcerated colonic mucosa with prolapse changes, tubular adenoma.   . COLONOSCOPY N/A 08/02/2013   RMR: Colonic polyps -removed as described above. Colonic diverticulosis. Status post reight hemicolectomy. Radiation proctitis- asymptomatic.   Marland Kitchen COLONOSCOPY N/A 05/09/2015   Procedure: COLONOSCOPY;  Surgeon: Daneil Dolin, MD;  Location[facility];  Service: Endoscopy;  Laterality: N/A;  1200-moved to [address]5 [address] to notify pt  . COLONOSCOPY WITH PROPOFOL N/A 11/10/2017   Procedure: COLONOSCOPY WITH PROPOFOL;  Surgeon: Daneil Dolin, MD;  Location: [facility];  Service: Endoscopy;  Laterality: N/A;  . CORONARY ANGIOPLASTY  06/17/14   OM1 POBA  . CORONARY STENT PLACEMENT  01/2011   2.5x63mm Xience drug-eluting stent in ramus intermedius/OMI vessel, 2.5x46mm Promus Element stent in mid LAD artery  . ESOPHAGOGASTRODUODENOSCOPY  10/10/2008   Dr. Tilda Burrow hiatal hernia, normal esophagus, normal stomach  . ESOPHAGOGASTRODUODENOSCOPY  02/12/2004   Dr. Dudley Major- normal   . ESOPHAGOGASTRODUODENOSCOPY N/A 05/09/2015   Procedure: ESOPHAGOGASTRODUODENOSCOPY (EGD);  Surgeon: Daneil Dolin, MD;  [facility];  Service: Endoscopy;  Laterality: N/A;  . ESOPHAGOGASTRODUODENOSCOPY (EGD) WITH PROPOFOL N/A 11/10/2017   Procedure: ESOPHAGOGASTRODUODENOSCOPY (EGD) WITH PROPOFOL;  Surgeon: Daneil Dolin, MD;  [facility];  Service: Endoscopy;  Laterality: N/A;  9:45am  . GIVENS CAPSULE STUDY N/A 06/03/2015   Procedure: GIVENS CAPSULE STUDY;  Surgeon: Daneil Dolin, MD;  Location: [facility];  Service: Endoscopy;  Laterality: N/A;  0700  . HOT HEMOSTASIS  11/10/2017   Procedure: HOT HEMOSTASIS (ARGON PLASMA COAGULATION/BICAP);  Surgeon: Daneil Dolin, MD;  Location: [facility];  Service: Endoscopy;;  rectum  . KNEE SURGERY     left knee   . LEFT HEART CATHETERIZATION WITH CORONARY ANGIOGRAM  N/A 06/17/2014   Procedure: LEFT HEART CATHETERIZATION WITH CORONARY ANGIOGRAM;  Surgeon: Burnell Blanks, MD;  Location: [facility] CATH LAB;  Service: Cardiovascular;  Laterality: N/A;  . PARTIAL COLECTOMY  02/29/2012   Procedure: PARTIAL COLECTOMY;  Surgeon: Adin Hector, MD;  Location: [facility];  Service: General;  Laterality: Right;  . POLYPECTOMY  11/10/2017   Procedure: POLYPECTOMY;  Surgeon: Daneil Dolin, MD;  Location[facility];  Service: Endoscopy;;  gastric colon  . PROSTATECTOMY    . TONSILLECTOMY    . TRANSURETHRAL RESECTION OF PROSTATE    . VASECTOMY      FAMILY HISTORY: Family History  Problem Relation Age of Onset  . Throat cancer Father        dx in his 37s and again in his 76s; pipe and cigar smoker  . Neuropathy Brother   . Prostate cancer Brother 21  . Melanoma Brother        dx in his 42s  . Cancer Paternal Uncle        smoking related cancer  . Heart attack Paternal Grandfather   . Stomach cancer Cousin 11  . Colon cancer Neg Hx     SOCIAL HISTORY: Social History   Socioeconomic History  . Marital status: Married    Spouse name: Not on file  . Number of children: 3  . 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 Highest education level: Not on file  Occupational History  . 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 Smoking status: Never Smoker  . Smokeless tobacco: Never Used  Vaping Use  . Vaping Use: Never used  Substance and Sexual Activity  . Alcohol use: Not Currently    Comment: 1 cocktail/month  . Drug use: No  . Sexual activity: Not on file  Other Topics Concern  . Not on file  Social History Narrative   Right handed   Caffeine~1 cup per day    Lives at home with wife    Social Determinants of Health   Financial Resource Strain:   . Difficulty of Paying Living Expenses: Not on file  Food Insecurity:   . Worried About Charity fundraiser in the Last Year: Not on file  . Ran Out  of Food in the Last Year: Not on file    Transportation Needs:   . Lack of Transportation (Medical): Not on file  . Lack of Transportation (Non-Medical): Not on file  Physical Activity:   . Days of Exercise per Week: Not on file  . Minutes of Exercise per Session: Not on file  Stress:   . Feeling of Stress : Not on file  Social Connections:   . Frequency of Communication with Friends and Family: Not on file  . Frequency of Social Gatherings with Friends and Family: Not on file  . Attends Religious Services: Not on file  . Active Member of Clubs or Organizations: Not on file  . Attends Archivist Meetings: Not on file  . Marital Status: Not on file  Intimate Partner Violence:   . Fear of Current or Ex-Partner: Not on file  . Emotionally Abused: Not on file  . Physically Abused: Not on file  . Sexually Abused: Not on file      PHYSICAL EXAM  There were no vitals filed for this visit. There is no height or weight on file to calculate BMI.  Generalized: Well developed, in no acute distress   Neurological examination  Mentation: Alert oriented to time, place, history taking. Follows all commands speech and language fluent Cranial nerve II-XII: Pupils were equal round reactive to light. Extraocular movements were full, visual field were full on confrontational test. Facial sensation and strength were normal. Uvula tongue midline. Head turning and shoulder shrug  were normal and symmetric. Motor: The motor testing reveals 5 over 5 strength of all 4 extremities. Good symmetric motor tone is noted throughout.  Sensory: Sensory testing is intact to soft touch on all 4 extremities. No evidence of extinction is noted.  Coordination: Cerebellar testing reveals good finger-nose-finger and heel-to-shin bilaterally.  Gait and station: Gait is normal. Tandem gait is normal. Romberg is negative. No drift is seen.  Reflexes: Deep tendon reflexes are symmetric and normal bilaterally.   DIAGNOSTIC DATA (LABS, IMAGING,  TESTING) - I reviewed patient records, labs, notes, testing and imaging myself where available.  Lab Results  Component Value Date   WBC 16.3 (H) 01/03/2020   HGB 14.1 01/03/2020   HCT 44.5 01/03/2020   MCV 95.7 01/03/2020   PLT 373 01/03/2020      Component Value Date/Time   NA 140 01/03/2020 1413   K 4.0 01/03/2020 1413   CL 107 01/03/2020 1413   CO2 26 01/03/2020 1413   GLUCOSE 88 01/03/2020 1413   BUN 18 01/03/2020 1413   CREATININE 1.09 01/03/2020 1413   CREATININE 1.20 (H) 05/05/2016 0840   CALCIUM 8.8 (L) 01/03/2020 1413   PROT 6.6 01/03/2020 1413   PROT 6.4 04/02/2019 1248   ALBUMIN 3.7 01/03/2020 1413   AST 20 01/03/2020 1413   ALT 21 01/03/2020 1413   ALKPHOS 54 01/03/2020 1413   BILITOT 0.7 01/03/2020 1413   GFRNONAA >60 01/03/2020 1413   GFRAA >60 01/03/2020 1413   Lab Results  Component Value Date   CHOL 133 04/27/2017   HDL 63 04/27/2017   LDLCALC 49 04/27/2017   TRIG 106 04/27/2017   CHOLHDL 2.1 04/27/2017   Lab Results  Component Value Date   HGBA1C 5.9 (H) 11/25/2013   Lab Results  Component Value Date   MBWGYKZL93 570 01/03/2020   Lab Results  Component Value Date   TSH 3.129 10/14/2012      ASSESSMENT AND PLAN 85 y.o. year old male  has a past medical history of Abnormality of gait (12/18/2013), Arthritis, Bilateral foot-drop (04/02/2019), Biliary dyskinesia, CAD (coronary artery disease) (2012), CLL (chronic lymphocytic leukemia) ([place]), Colon cancer ([place]) (2013), Colon polyps, Diverticula of colon (2010), Essential hypertension, Exposure to [employer], Folic acid deficiency, Gastroparesis (2014), GERD (gastroesophageal reflux disease), Glaucoma, Heart palpitations (2014), Hiatal hernia (2010), History of anemia as a child, History of kidney stones, Iron deficiency (2013), Myocardial infarction ([place]) (2012), Peripheral neuropathy, Prostate cancer ([place]) (2010), PTSD (post-traumatic stress disorder), Radiation proctitis, and Rosacea. here with  ***   I spent 15 minutes with the patient. 50% of this time was spent   Butler Denmark, [place], DNP 06/25/2020, 5:59 AM [facility] [address], [place], [place] [address] [phone]

## 2020-07-01 ENCOUNTER — Ambulatory Visit (INDEPENDENT_AMBULATORY_CARE_PROVIDER_SITE_OTHER): Payer: Medicare PPO | Admitting: Internal Medicine

## 2020-07-01 ENCOUNTER — Encounter: Payer: Self-pay | Admitting: Internal Medicine

## 2020-07-01 ENCOUNTER — Other Ambulatory Visit: Payer: Self-pay

## 2020-07-01 ENCOUNTER — Other Ambulatory Visit: Payer: Self-pay | Admitting: *Deleted

## 2020-07-01 ENCOUNTER — Telehealth: Payer: Self-pay | Admitting: *Deleted

## 2020-07-01 VITALS — BP 152/78 | HR 58 | Temp 97.7°F | Ht 71.0 in | Wt 219.2 lb

## 2020-07-01 DIAGNOSIS — R1013 Epigastric pain: Secondary | ICD-10-CM

## 2020-07-01 DIAGNOSIS — C182 Malignant neoplasm of ascending colon: Secondary | ICD-10-CM

## 2020-07-01 DIAGNOSIS — K439 Ventral hernia without obstruction or gangrene: Secondary | ICD-10-CM

## 2020-07-01 MED ORDER — PANTOPRAZOLE SODIUM 40 MG PO TBEC
40.0000 mg | DELAYED_RELEASE_TABLET | Freq: Two times a day (BID) | ORAL | 3 refills | Status: DC
Start: 2020-07-01 — End: 2021-01-04

## 2020-07-01 NOTE — Telephone Encounter (Signed)
PA approved. Auth# [id number] dates 07/25/2020-08/24/2020

## 2020-07-01 NOTE — Patient Instructions (Signed)
Increase protonix to 40 mg twice daily for now (disp 60 - 3 refills)  Stop INSTAFLEX  Limit use of Ibuprofen  CT of the abdomen and pelvis with IV contrast (epigastric pain, ventral hernia - hx of colon cancer)   Further recommendations to follow

## 2020-07-01 NOTE — Progress Notes (Signed)
Primary Care Physician:  Leeanne Rio, MD Primary Gastroenterologist:  Dr. Gala Romney  Pre-Procedure History & Physical: HPI:  David Pugh. is a 85 y.o. male here for further evaluation of generalized periumbilical abdominal pain of 1 months duration;  states has gone from a severity of 2 to 4 out of 10.  He reproducibly gets nearly complete relief with eating a meal for couple hours -  then it recurs.  He is not awakened at night with abdominal pain.  Not associated with having a bowel movement.  States she has 1 formed bowel movement daily- not dark/ no blood.  Has been able to eat good meals.  His weight is stable.  Reflux well controlled on Protonix 40 mg once daily. Does take occasional ibuprofen.  It is notable he started an OTC herbal remedy called "Insta Flex" about 2 months ago for his knees.  It has helped his knees tremendously. CBC and CEA look good 6 months ago (history of stage III colorectal cancer  -  status post right hemicolectomy-  tolerated only 1 round of chemotherapy 7 years ago).  Chronically elevated white count-history of CLL.  History of prostate cancer with radiation proctitis-treated with APC previously. History of benign polyps on EGD previously (negative for H. Pylori).  Last colonoscopy 2019  -  small adenoma removed. No future colonoscopy recommended.   Past Medical History:  Diagnosis Date  . Abnormality of gait 12/18/2013  . Arthritis   . Bilateral foot-drop 04/02/2019  . Biliary dyskinesia   . CAD (coronary artery disease) 2012   a. DES x2 in ramus/OM and mid LAD in 2012  b. balloon angioplaty of OM 2015  . CLL (chronic lymphocytic leukemia) ([place])   . Colon cancer [facility]) 2013   Right hemicolectomy, did not tolerate chemotherapy  . Colon polyps    tubular adenomas  . Diverticula of colon 2010  . Essential hypertension   . Exposure to [employer]   . Folic acid deficiency   . Gastroparesis 2014  . GERD (gastroesophageal reflux disease)   .  Glaucoma   . Heart palpitations 2014  . Hiatal hernia 2010  . History of anemia as a child   . History of kidney stones   . Iron deficiency 2013  . Myocardial infarction ([place]) 2012  . Peripheral neuropathy   . Prostate cancer [facility]) 2010   Radiation, surgery  . PTSD (post-traumatic stress disorder)   . Radiation proctitis   . Rosacea     Past Surgical History:  Procedure Laterality Date  . CARDIAC CATHETERIZATION  02/13/2011   [facility] cardiology  . CATARACT EXTRACTION     2011  . CATARACT EXTRACTION W/PHACO Right 08/06/2013   Procedure: CATARACT EXTRACTION PHACO AND INTRAOCULAR LENS PLACEMENT (IOC);  Surgeon: Tonny Branch, MD;  Location: [facility] ORS;  Service: Ophthalmology;  Laterality: Right;  CDE:20.96  . CHOLECYSTECTOMY    . COLONOSCOPY  02/12/2004   Dr. Gala Romney- L side diverticula, inflammatory  colon polyps  . COLONOSCOPY  01/19/2012   RMR: Prominent changes involving the rectal mucosa consistent with radiation-induced proctitis. Multiple colonic  polyps removed as described above. Sigmoid Diverticulosis/ 1.5 x 2 cm relatively flat ulcerated lesion in cecum/path showed adenocarcinoma of the colon. descending colon polyp with tubular adenoma and one fragment of focal high grade dysplasia.   . COLONOSCOPY  08/28/2012   [facility]:[id number] proctitis. Colonic diverticulosis/Ulcerations at the surgical anastomosis  path: ulcerated colonic mucosa with prolapse changes, tubular adenoma.   Marland Kitchen  COLONOSCOPY N/A 08/02/2013   RMR: Colonic polyps -removed as described above. Colonic diverticulosis. Status post reight hemicolectomy. Radiation proctitis- asymptomatic.   Marland Kitchen COLONOSCOPY N/A 05/09/2015   Procedure: COLONOSCOPY;  Surgeon: Daneil Dolin, MD;  Location: [facility];  Service: Endoscopy;  Laterality: N/A;  1200-moved to [address]5 [address] to notify pt  . COLONOSCOPY WITH PROPOFOL N/A 11/10/2017   Procedure: COLONOSCOPY WITH PROPOFOL;  Surgeon: Daneil Dolin, MD;  Location: [facility];  Service:  Endoscopy;  Laterality: N/A;  . CORONARY ANGIOPLASTY  06/17/14   OM1 POBA  . CORONARY STENT PLACEMENT  01/2011   2.5x80mm Xience drug-eluting stent in ramus intermedius/OMI vessel, 2.5x34mm Promus Element stent in mid LAD artery  . ESOPHAGOGASTRODUODENOSCOPY  10/10/2008   Dr. Tilda Burrow hiatal hernia, normal esophagus, normal stomach  . ESOPHAGOGASTRODUODENOSCOPY  02/12/2004   Dr. Dudley Major- normal   . ESOPHAGOGASTRODUODENOSCOPY N/A 05/09/2015   Procedure: ESOPHAGOGASTRODUODENOSCOPY (EGD);  Surgeon: Daneil Dolin, MD;  [facility]: [facility];  Service: Endoscopy;  Laterality: N/A;  . ESOPHAGOGASTRODUODENOSCOPY (EGD) WITH PROPOFOL N/A 11/10/2017   Procedure: ESOPHAGOGASTRODUODENOSCOPY (EGD) WITH PROPOFOL;  Surgeon: Daneil Dolin, MD;  Location: [facility];  Service: Endoscopy;  Laterality: N/A;  9:45am  . GIVENS CAPSULE STUDY N/A 06/03/2015   Procedure: GIVENS CAPSULE STUDY;  Surgeon: Daneil Dolin, MD;  Location: [facility];  Service: Endoscopy;  Laterality: N/A;  0700  . HOT HEMOSTASIS  11/10/2017   Procedure: HOT HEMOSTASIS (ARGON PLASMA COAGULATION/BICAP);  Surgeon: Daneil Dolin, MD;  Location: [facility];  Service: Endoscopy;;  rectum  . KNEE SURGERY     left knee   . LEFT HEART CATHETERIZATION WITH CORONARY ANGIOGRAM N/A 06/17/2014   Procedure: LEFT HEART CATHETERIZATION WITH CORONARY ANGIOGRAM;  Surgeon: Burnell Blanks, MD;  Location: [facility] CATH LAB;  Service: Cardiovascular;  Laterality: N/A;  . PARTIAL COLECTOMY  02/29/2012   Procedure: PARTIAL COLECTOMY;  Surgeon: Adin Hector, MD;  Location: [facility];  Service: General;  Laterality: Right;  . POLYPECTOMY  11/10/2017   Procedure: POLYPECTOMY;  Surgeon: Daneil Dolin, MD;  Location: [facility];  Service: Endoscopy;;  gastric colon  . PROSTATECTOMY    . TONSILLECTOMY    . TRANSURETHRAL RESECTION OF PROSTATE    . VASECTOMY      Prior to Admission medications   Medication Sig Start Date End Date Taking? Authorizing  Provider  ALPRAZolam Duanne Moron) 0.5 MG tablet Take 0.5 mg by mouth daily as needed for anxiety.   Yes [provider]  aspirin EC 81 MG tablet Take 81 mg by mouth daily.   Yes [provider]  B Complex Vitamins (VITAMIN B COMPLEX PO) Take 1 tablet by mouth at bedtime.    Yes [provider]  cholecalciferol (VITAMIN D3) 25 MCG (1000 UNIT) tablet Take 1,000 Units by mouth 2 (two) times daily.   Yes [provider]  citalopram (CELEXA) 20 MG tablet Take 10 mg by mouth daily.    Yes [provider]  clopidogrel (PLAVIX) 75 MG tablet TAKE 1 TABLET BY MOUTH ONCE DAILY. 06/09/20  Yes Satira Sark, MD  fluticasone (FLONASE) 50 MCG/ACT nasal spray Place 1 spray into both nostrils at bedtime. 09/08/19  Yes [provider]  folic acid (FOLVITE) 1 MG tablet TAKE (1) TABLET BY MOUTH ONCE DAILY . Patient taking differently: Take 1 mg by mouth daily.  07/13/17  Yes Holley Bouche, NP  ibuprofen (ADVIL,MOTRIN) 200 MG tablet Take 400 mg  by mouth daily as needed for headache or moderate pain.   Yes [provider]  latanoprost (XALATAN) 0.005 % ophthalmic solution Place 1 drop into both eyes at bedtime. 01/21/20  Yes Rankin, Clent Demark, MD  loperamide (IMODIUM) 2 MG capsule Take 2 mg by mouth as needed for diarrhea or loose stools.   Yes [provider]  losartan (COZAAR) 50 MG tablet Take 25 mg by mouth 2 (two) times daily.    Yes [provider]  metoprolol (LOPRESSOR) 50 MG tablet Take 50 mg by mouth 2 (two) times daily.    Yes [provider]  montelukast (SINGULAIR) 10 MG tablet Take 10 mg by mouth at bedtime.   Yes [provider]  Multiple Vitamin (MULTIVITAMIN WITH MINERALS) TABS tablet Take 1 tablet by mouth daily.   Yes [provider]  nitroGLYCERIN (NITROSTAT) 0.4 MG SL tablet PLACE ONE (1) TABLET UNDER TONGUE EVERY 5 MINUTES UP TO (3) DOSES AS NEEDED FOR CHEST PAIN. 02/21/20  Yes Satira Sark, MD  OVER THE COUNTER MEDICATION Instaflex one tablet daily   Yes [provider]  pantoprazole (PROTONIX) 40 MG tablet Take 40 mg by mouth daily.   Yes [provider]  pravastatin (PRAVACHOL) 40 MG tablet TAKE 1 TABLET BY MOUTH AT BEDTIME. 01/07/20  Yes Satira Sark, MD  vitamin B-12 (CYANOCOBALAMIN) 1000 MCG tablet Take by mouth daily.    Yes [provider]  Wheat Dextrin (BENEFIBER) POWD Take by mouth as needed.    Yes [provider]    Allergies as of 07/01/2020 - Review Complete 07/01/2020  Allergen Reaction Noted  . Shellfish allergy Anaphylaxis 01/13/2012  . Sulfonamide derivatives Diarrhea and Nausea Only 04/30/2009    Family History  Problem Relation Age of Onset  . Throat cancer Father        dx in his 67s and again in his 41s; pipe and cigar smoker  . Neuropathy Brother   . Prostate cancer Brother 63  . Melanoma Brother        dx in his 16s  . Cancer Paternal Uncle        smoking related cancer  . Heart attack Paternal Grandfather   . Stomach cancer Cousin 23  . Colon cancer Neg Hx     Social History   Socioeconomic History  . Marital status: Married    Spouse name: Not on file  . Number of children: 3  . 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 Highest education level: Not on file  Occupational History  . 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 Smoking status: Never Smoker  . Smokeless tobacco: Never Used  Vaping Use  . Vaping Use: Never used  Substance and Sexual Activity  . Alcohol use: Yes    Comment: 1 drink every 2 months  . Drug use: No  . Sexual activity: Not on file  Other Topics Concern  . Not on file  Social History Narrative   Right handed   Caffeine~1 cup per day    Lives at home with wife    Social Determinants of Health   Financial Resource Strain:   . Difficulty of Paying Living Expenses: Not on file  Food Insecurity:   . Worried About Charity fundraiser in the Last Year:  Not on file  . Ran Out of Food in the Last Year: Not on file  Transportation Needs:   . Lack of Transportation (Medical): Not  on file  . Lack of Transportation (Non-Medical): Not on file  Physical Activity:   . Days of Exercise per Week: Not on file  . Minutes of Exercise per Session: Not on file  Stress:   . Feeling of Stress : Not on file  Social Connections:   . Frequency of Communication with Friends and Family: Not on file  . Frequency of Social Gatherings with Friends and Family: Not on file  . Attends Religious Services: Not on file  . Active Member of Clubs or Organizations: Not on file  . Attends Archivist Meetings: Not on file  . Marital Status: Not on file  Intimate Partner Violence:   . Fear of Current or Ex-Partner: Not on file  . Emotionally Abused: Not on file  . Physically Abused: Not on file  . Sexually Abused: Not on file    Review of Systems: See HPI, otherwise negative ROS  Physical Exam: BP (!) 152/78   Pulse (!) 58   Temp 97.7 F (36.5 C) (Temporal)   Ht 5\' 11"  (1.803 m)   Wt 219 lb 3.2 oz (99.4 kg)   BMI 30.57 kg/m  General:   Alert, elderly, somewhat frail, pleasant and cooperative in NAD Neck:  Supple; no masses or thyromegaly. No significant cervical adenopathy. Lungs:  Clear throughout to auscultation.   No wheezes, crackles, or rhonchi. No acute distress. Heart:  Regular rate and rhythm; no murmurs, clicks, rubs,  or gallops. Abdomen: Ventral hernia/diastases present.  As the bowel sounds.  Soft and nontender without appreciable mass organomegaly  Pulses:  Normal pulses noted. Extremities:  Without clubbing or edema.  Impression/Plan: 1 month history of insidiously worsening periumbilical abdominal pain reproducibly improved with eating a meal.  NSAID use.  Recent self-medicating with the herbal remedy. Concomitant PPI therapy.  History of stage III colon cancer with incomplete course of chemotherapy. Symptoms sound more acid peptic  than any thing else.  Certainly, not consistent with ischemia. Oncology surveillance through this year has been reassuring.  Last colonoscopy 2019 also reassuring. GERD well-controlled on PPI.  Recommendations:  Stop Insta Flex as it could be playing a role here or use could be entirely coincidental.  Continue PPI.  Stop ibuprofen for the time being.  Initially recommended 2 week progress report; if symptoms not much better at that time further evaluation would be warranted.  In the setting, would opt for CT.  Patient states he is quite anxious about the worsening abdominal pain/cancer history.  He would prefer to go ahead have a CT scan now.  This is not unreasonable given the totality of the situation.  Therefore, we will proceed with a contrast CT of the abdomen and pelvis in the near future.  Further recommendations to follow in the near future.     Notice: This dictation was prepared with Dragon dictation along with smaller phrase technology. Any transcriptional errors that result from this process are unintentional and may not be corrected upon review.

## 2020-07-01 NOTE — Telephone Encounter (Signed)
CT A/P scheduled for 11/26 at 1:00pm, arrival 12:45pm, npo 4 hrs prior, p/u oral contrast at least 1-2 days prior to procedure.  Called pt and made aware of appt details. He voiced understanding.  PA submitted via [employer]. PA pending review. Tracking # [id number]

## 2020-07-08 ENCOUNTER — Other Ambulatory Visit (HOSPITAL_COMMUNITY): Payer: Self-pay

## 2020-07-08 DIAGNOSIS — C9110 Chronic lymphocytic leukemia of B-cell type not having achieved remission: Secondary | ICD-10-CM

## 2020-07-09 ENCOUNTER — Inpatient Hospital Stay (HOSPITAL_COMMUNITY): Payer: Medicare PPO | Attending: Hematology

## 2020-07-09 ENCOUNTER — Other Ambulatory Visit: Payer: Self-pay

## 2020-07-09 DIAGNOSIS — Z9221 Personal history of antineoplastic chemotherapy: Secondary | ICD-10-CM | POA: Diagnosis not present

## 2020-07-09 DIAGNOSIS — Z85038 Personal history of other malignant neoplasm of large intestine: Secondary | ICD-10-CM | POA: Diagnosis not present

## 2020-07-09 DIAGNOSIS — Z8546 Personal history of malignant neoplasm of prostate: Secondary | ICD-10-CM | POA: Insufficient documentation

## 2020-07-09 DIAGNOSIS — Z923 Personal history of irradiation: Secondary | ICD-10-CM | POA: Diagnosis not present

## 2020-07-09 DIAGNOSIS — Z8719 Personal history of other diseases of the digestive system: Secondary | ICD-10-CM | POA: Insufficient documentation

## 2020-07-09 DIAGNOSIS — C9110 Chronic lymphocytic leukemia of B-cell type not having achieved remission: Secondary | ICD-10-CM | POA: Insufficient documentation

## 2020-07-09 DIAGNOSIS — Z8249 Family history of ischemic heart disease and other diseases of the circulatory system: Secondary | ICD-10-CM | POA: Insufficient documentation

## 2020-07-09 LAB — COMPREHENSIVE METABOLIC PANEL
ALT: 20 U/L (ref 0–44)
AST: 20 U/L (ref 15–41)
Albumin: 3.7 g/dL (ref 3.5–5.0)
Alkaline Phosphatase: 52 U/L (ref 38–126)
Anion gap: 7 (ref 5–15)
BUN: 19 mg/dL (ref 8–23)
CO2: 24 mmol/L (ref 22–32)
Calcium: 8.6 mg/dL — ABNORMAL LOW (ref 8.9–10.3)
Chloride: 106 mmol/L (ref 98–111)
Creatinine, Ser: 1.15 mg/dL (ref 0.61–1.24)
GFR, Estimated: >60 mL/min (ref >60)
Glucose, Bld: 109 mg/dL — ABNORMAL HIGH (ref 70–99)
Potassium: 4.2 mmol/L (ref 3.5–5.1)
Sodium: 137 mmol/L (ref 135–145)
Total Bilirubin: 0.9 mg/dL (ref 0.3–1.2)
Total Protein: 6.3 g/dL — ABNORMAL LOW (ref 6.5–8.1)

## 2020-07-09 LAB — CBC WITH DIFFERENTIAL/PLATELET
Abs Immature Granulocytes: 0.06 10*3/uL (ref 0.00–0.07)
Basophils Absolute: 0.1 10*3/uL (ref 0.0–0.1)
Basophils Relative: 0 %
Eosinophils Absolute: 0.6 10*3/uL — ABNORMAL HIGH (ref 0.0–0.5)
Eosinophils Relative: 4 %
HCT: 44.8 % (ref 39.0–52.0)
Hemoglobin: 14.2 g/dL (ref 13.0–17.0)
Immature Granulocytes: 0 %
Lymphocytes Relative: 51 %
Lymphs Abs: 7.4 10*3/uL — ABNORMAL HIGH (ref 0.7–4.0)
MCH: 30.6 pg (ref 26.0–34.0)
MCHC: 31.7 g/dL (ref 30.0–36.0)
MCV: 96.6 fL (ref 80.0–100.0)
Monocytes Absolute: 1.1 10*3/uL — ABNORMAL HIGH (ref 0.1–1.0)
Monocytes Relative: 8 %
Neutro Abs: 5.5 10*3/uL (ref 1.7–7.7)
Neutrophils Relative %: 37 %
Platelets: 350 10*3/uL (ref 150–400)
RBC: 4.64 MIL/uL (ref 4.22–5.81)
RDW: 12.5 % (ref 11.5–15.5)
WBC: 14.7 10*3/uL — ABNORMAL HIGH (ref 4.0–10.5)
nRBC: 0 % (ref 0.0–0.2)

## 2020-07-09 LAB — IRON AND TIBC
Iron: 116 ug/dL (ref 45–182)
Saturation Ratios: 34 % (ref 17.9–39.5)
TIBC: 336 ug/dL (ref 250–450)
UIBC: 220 ug/dL

## 2020-07-09 LAB — PSA: Prostatic Specific Antigen: 0.16 ng/mL (ref 0.00–4.00)

## 2020-07-09 LAB — FERRITIN: Ferritin: 52 ng/mL (ref 24–336)

## 2020-07-09 LAB — LACTATE DEHYDROGENASE: LDH: 127 U/L (ref 98–192)

## 2020-07-09 LAB — VITAMIN D 25 HYDROXY (VIT D DEFICIENCY, FRACTURES): Vit D, 25-Hydroxy: 48.92 ng/mL (ref 30–100)

## 2020-07-09 LAB — FOLATE: Folate: 77.1 ng/mL (ref >5.9)

## 2020-07-09 LAB — VITAMIN B12: Vitamin B-12: 427 pg/mL (ref 180–914)

## 2020-07-10 LAB — CEA: CEA: 1.5 ng/mL (ref 0.0–4.7)

## 2020-07-14 ENCOUNTER — Other Ambulatory Visit: Payer: Self-pay

## 2020-07-14 ENCOUNTER — Inpatient Hospital Stay (HOSPITAL_BASED_OUTPATIENT_CLINIC_OR_DEPARTMENT_OTHER): Payer: Medicare PPO | Admitting: Oncology

## 2020-07-14 ENCOUNTER — Encounter (HOSPITAL_COMMUNITY): Payer: Self-pay | Admitting: Oncology

## 2020-07-14 VITALS — BP 141/64 | HR 58 | Temp 98.3°F | Resp 19 | Wt 222.0 lb

## 2020-07-14 DIAGNOSIS — C9110 Chronic lymphocytic leukemia of B-cell type not having achieved remission: Secondary | ICD-10-CM

## 2020-07-14 NOTE — Progress Notes (Signed)
David Pugh, [place] [address]   CLINIC:  Medical Oncology/Hematology  PCP:  Leeanne Rio, MD [address] [place] [address] [phone]   REASON FOR VISIT: Follow-up for CLL   CURRENT THERAPY: Clinical surveillance  BRIEF ONCOLOGIC HISTORY:  Oncology History  Cancer of ascending colon ([place])  02/29/2012 Procedure   laparoscopic assisted R colectomy   03/09/2012 Miscellaneous   Iron deficiency and folic acid deficiency   03/29/2012 Initial Diagnosis   Cancer of ascending colon, CEA on 01/19/2012 was WNL   08/02/2013 Procedure   Ileocolonoscopy with snare polypectomy   06/11/2014 Imaging   CT abdomen/pelvis with no findings to suggest metastatic disease. Diverticulosis   06/11/2016 Imaging   CT Abdomen/Pelvis with no findings to suggest recurrent or metastatic disease. Prostatomegaly.      CANCER STAGING: Cancer Staging Cancer of ascending colon ([place]) Staging form: Colon and Rectum, AJCC 7th Edition - Clinical: Stage IIIB (T3, N1c, M0) - Signed by Baird Cancer, PA on 05/02/2012  INTERVAL HISTORY:  David Pugh 85 y.o. male returns for routine follow-up for CLL.  Patient reports he is doing well since his last visit.  He has chronic right shoulder pain and rates it a 2 out of 10.  He was recently evaluated by GI for abdominal pain and he was instructed to stop NSAID use and an OTC supplement for joint pain.  Symptoms did not improve over 2 weeks span and a CT scan was ordered.  This is scheduled for the end of the month.  He denies any B symptoms including fevers, chills, night sweats or unexplained weight loss. Denies any nausea, vomiting, or diarrhea. Had not noticed any recent bleeding such as epistaxis, hematuria or hematochezia. Denies recent chest pain on exertion, shortness of breath on minimal exertion, pre-syncopal episodes, or palpitations. Denies any numbness or tingling in hands or feet. Denies any recent fevers,  infections, or recent hospitalizations. Patient reports appetite at 100% and energy level at 75%.  Patient is eating well maintain his weight at this time.  REVIEW OF SYSTEMS:  Review of Systems  Gastrointestinal: Positive for abdominal pain and constipation.  Neurological: Positive for numbness.  Psychiatric/Behavioral: The patient is nervous/anxious.   All other systems reviewed and are negative.    PAST MEDICAL/SURGICAL HISTORY:  Past Medical History:  Diagnosis Date  . Abnormality of gait 12/18/2013  . Arthritis   . Bilateral foot-drop 04/02/2019  . Biliary dyskinesia   . CAD (coronary artery disease) 2012   a. DES x2 in ramus/OM and mid LAD in 2012  b. balloon angioplaty of OM 2015  . CLL (chronic lymphocytic leukemia) ([place])   . Colon cancer [facility]) 2013   Right hemicolectomy, did not tolerate chemotherapy  . Colon polyps    tubular adenomas  . Diverticula of colon 2010  . Essential hypertension   . Exposure to [employer]   . Folic acid deficiency   . Gastroparesis 2014  . GERD (gastroesophageal reflux disease)   . Glaucoma   . Heart palpitations 2014  . Hiatal hernia 2010  . History of anemia as a child   . History of kidney stones   . Iron deficiency 2013  . Myocardial infarction ([place]) 2012  . Peripheral neuropathy   . Prostate cancer [facility]) 2010   Radiation, surgery  . PTSD (post-traumatic stress disorder)   . Radiation proctitis   . Rosacea    Past Surgical History:  Procedure Laterality  Date  . CARDIAC CATHETERIZATION  02/13/2011   [facility], [facility] cardiology  . CATARACT EXTRACTION     2011  . CATARACT EXTRACTION W/PHACO Right 08/06/2013   Procedure: CATARACT EXTRACTION PHACO AND INTRAOCULAR LENS PLACEMENT (IOC);  Surgeon: Tonny Branch, MD;  Location: [facility] ORS;  Service: Ophthalmology;  Laterality: Right;  CDE:20.96  . CHOLECYSTECTOMY    . COLONOSCOPY  02/12/2004   Dr. Gala Romney- L side diverticula, inflammatory  colon polyps  . COLONOSCOPY  01/19/2012   RMR:  Prominent changes involving the rectal mucosa consistent with radiation-induced proctitis. Multiple colonic  polyps removed as described above. Sigmoid Diverticulosis/ 1.5 x 2 cm relatively flat ulcerated lesion in cecum/path showed adenocarcinoma of the colon. descending colon polyp with tubular adenoma and one fragment of focal high grade dysplasia.   . COLONOSCOPY  08/28/2012   WER:[id number] proctitis. Colonic diverticulosis/Ulcerations at the surgical anastomosis  path: ulcerated colonic mucosa with prolapse changes, tubular adenoma.   . COLONOSCOPY N/A 08/02/2013   RMR: Colonic polyps -removed as described above. Colonic diverticulosis. Status post reight hemicolectomy. Radiation proctitis- asymptomatic.   Marland Kitchen COLONOSCOPY N/A 05/09/2015   Procedure: COLONOSCOPY;  Surgeon: Daneil Dolin, MD;  Location: [facility] ENDO SUITE;  Service: Endoscopy;  Laterality: N/A;  1200-moved to [address]5 [address] to notify pt  . COLONOSCOPY WITH PROPOFOL N/A 11/10/2017   Procedure: COLONOSCOPY WITH PROPOFOL;  Surgeon: Daneil Dolin, MD;  Location: [facility] END[facility] SUITE;  Service: Endoscopy;  Laterality: N/A;  . CORONARY ANGIOPLASTY  06/17/14   OM1 POBA  . CORONARY STENT PLACEMENT  01/2011   2.5x66mm Xience drug-eluting stent in ramus intermedius/OMI vessel, 2.5x51mm Promus Element stent in mid LAD artery  . ESOPHAGOGASTRODUODENOSCOPY  10/10/2008   Dr. Tilda Burrow hiatal hernia, normal esophagus, normal stomach  . ESOPHAGOGASTRODUODENOSCOPY  02/12/2004   Dr. Dudley Major- normal   . ESOPHAGOGASTRODUODENOSCOPY N/A 05/09/2015   Procedure: ESOPHAGOGASTRODUODENOSCOPY (EGD);  Surgeon: Daneil Dolin, MD;  Location[facility];  Service: Endoscopy;  Laterality: N/A;  . ESOPHAGOGASTRODUODENOSCOPY (EGD) WITH PROPOFOL N/A 11/10/2017   Procedure: ESOPHAGOGASTRODUODENOSCOPY (EGD) WITH PROPOFOL;  Surgeon: Daneil Dolin, MD;  Location: [facility];  Service: Endoscopy;  Laterality: N/A;  9:45am  . GIVENS CAPSULE STUDY N/A 06/03/2015   Procedure: GIVENS  CAPSULE STUDY;  Surgeon: Daneil Dolin, MD;  Location: [facility];  Service: Endoscopy;  Laterality: N/A;  0700  . HOT HEMOSTASIS  11/10/2017   Procedure: HOT HEMOSTASIS (ARGON PLASMA COAGULATION/BICAP);  Surgeon: Daneil Dolin, MD;  Location: [facility];  Service: Endoscopy;;  rectum  . KNEE SURGERY     left knee   . LEFT HEART CATHETERIZATION WITH CORONARY ANGIOGRAM N/A 06/17/2014   Procedure: LEFT HEART CATHETERIZATION WITH CORONARY ANGIOGRAM;  Surgeon: Burnell Blanks, MD;  Location: [facility] CATH LAB;  Service: Cardiovascular;  Laterality: N/A;  . PARTIAL COLECTOMY  02/29/2012   Procedure: PARTIAL COLECTOMY;  Surgeon: Adin Hector, MD;  Location: [facility] ORS;  Service: General;  Laterality: Right;  . POLYPECTOMY  11/10/2017   Procedure: POLYPECTOMY;  Surgeon: Daneil Dolin, MD;  Location: [facility];  Service: Endoscopy;;  gastric colon  . PROSTATECTOMY    . TONSILLECTOMY    . TRANSURETHRAL RESECTION OF PROSTATE    . VASECTOMY       SOCIAL HISTORY:  Social History   Socioeconomic History  . Marital status: Married    Spouse name: Not on file  . Number of children: 3  . 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 Highest education  level: Not on file  Occupational History  . 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 Smoking status: Never Smoker  . Smokeless tobacco: Never Used  Vaping Use  . Vaping Use: Never used  Substance and Sexual Activity  . Alcohol use: Yes    Comment: 1 drink every 6 months  . Drug use: No  . Sexual activity: Not Currently  Other Topics Concern  . Not on file  Social History Narrative   Right handed   Caffeine~1 cup per day    Lives at home with wife    Social Determinants of Health   Financial Resource Strain: Low Risk   . Difficulty of Paying Living Expenses: Not hard at all  Food Insecurity: No Food Insecurity  . Worried About Charity fundraiser in the Last Year: Never true  . Ran Out of Food in the Last Year: Never  true  Transportation Needs: No Transportation Needs  . Lack of Transportation (Medical): No  . Lack of Transportation (Non-Medical): No  Physical Activity: Inactive  . Days of Exercise per Week: 0 days  . Minutes of Exercise per Session: 0 min  Stress: No Stress Concern Present  . Feeling of Stress : Not at all  Social Connections: Moderately Isolated  . Frequency of Communication with Friends and Family: More than three times a week  . Frequency of Social Gatherings with Friends and Family: Three times a week  . Attends Religious Services: Never  . Active Member of Clubs or Organizations: No  . Attends Archivist Meetings: Never  . Marital Status: Married  Human resources officer Violence: Not At Risk  . Fear of Current or Ex-Partner: No  . Emotionally Abused: No  . Physically Abused: No  . Sexually Abused: No    FAMILY HISTORY:  Family History  Problem Relation Age of Onset  . Throat cancer Father        dx in his 85s and again in his 5s; pipe and cigar smoker  . Neuropathy Brother   . Prostate cancer Brother 44  . Melanoma Brother        dx in his 41s  . Cancer Paternal Uncle        smoking related cancer  . Heart attack Paternal Grandfather   . Stomach cancer Cousin 48  . Colon cancer Neg Hx     CURRENT MEDICATIONS:  Outpatient Encounter Medications as of 07/14/2020  Medication Sig Note  . [DISCONTINUED] citalopram (CELEXA) 20 MG tablet Take 1 tablet by mouth daily.   . [DISCONTINUED] fluticasone (FLONASE) 50 MCG/ACT nasal spray 1 spray into each nostril daily.   . [DISCONTINUED] losartan (COZAAR) 50 MG tablet Take 1 tablet by mouth daily.   Marland Kitchen ALPRAZolam (XANAX) 0.5 MG tablet Take 0.5 mg by mouth daily as needed for anxiety. 10/26/2017: Primarily uses this when fly  . aspirin EC 81 MG tablet Take 81 mg by mouth daily.   . B Complex Vitamins (VITAMIN B COMPLEX PO) Take 1 tablet by mouth at bedtime.    . cholecalciferol (VITAMIN D3) 25 MCG (1000 UNIT) tablet Take  1,000 Units by mouth 2 (two) times daily.   . citalopram (CELEXA) 20 MG tablet Take 10 mg by mouth daily.    . clopidogrel (PLAVIX) 75 MG tablet TAKE 1 TABLET BY MOUTH ONCE DAILY.   . fluticasone (FLONASE) 50 MCG/ACT nasal spray Place 1 spray into both nostrils at bedtime.   . folic acid (FOLVITE) 1  MG tablet TAKE (1) TABLET BY MOUTH ONCE DAILY . (Patient taking differently: Take 1 mg by mouth daily. )   . ibuprofen (ADVIL,MOTRIN) 200 MG tablet Take 400 mg by mouth daily as needed for headache or moderate pain.   Marland Kitchen latanoprost (XALATAN) 0.005 % ophthalmic solution Place 1 drop into both eyes at bedtime.   Marland Kitchen loperamide (IMODIUM) 2 MG capsule Take 2 mg by mouth as needed for diarrhea or loose stools.   Marland Kitchen losartan (COZAAR) 50 MG tablet Take 25 mg by mouth 2 (two) times daily.    . metoprolol (LOPRESSOR) 50 MG tablet Take 50 mg by mouth 2 (two) times daily.    . montelukast (SINGULAIR) 10 MG tablet Take 10 mg by mouth at bedtime.   . Multiple Vitamin (MULTIVITAMIN WITH MINERALS) TABS tablet Take 1 tablet by mouth daily.   . nitroGLYCERIN (NITROSTAT) 0.4 MG SL tablet PLACE ONE (1) TABLET UNDER TONGUE EVERY 5 MINUTES UP TO (3) DOSES AS NEEDED FOR CHEST PAIN.   Marland Kitchen OVER THE COUNTER MEDICATION Instaflex one tablet daily   . pantoprazole (PROTONIX) 40 MG tablet Take 1 tablet (40 mg total) by mouth 2 (two) times daily.   . pravastatin (PRAVACHOL) 40 MG tablet TAKE 1 TABLET BY MOUTH AT BEDTIME.   . vitamin B-12 (CYANOCOBALAMIN) 1000 MCG tablet Take by mouth daily.    . Wheat Dextrin (BENEFIBER) POWD Take by mouth as needed.    . [DISCONTINUED] pantoprazole (PROTONIX) 40 MG tablet Take 40 mg by mouth daily.    No facility-administered encounter medications on file as of 07/14/2020.    ALLERGIES:  Allergies  Allergen Reactions  . Shellfish Allergy Anaphylaxis  . Sulfonamide Derivatives Diarrhea and Nausea Only     PHYSICAL EXAM:  ECOG Performance status: 1  Vitals:   07/14/20 0957  BP: (!)  141/64  Pulse: (!) 58  Resp: 19  Temp: 98.3 F (36.8 C)  SpO2: 98%   Filed Weights   07/14/20 0957  Weight: 222 lb (100.7 kg)   Physical Exam Constitutional:      Appearance: Normal appearance. He is normal weight.  Cardiovascular:     Rate and Rhythm: Normal rate and regular rhythm.     Heart sounds: Normal heart sounds.  Pulmonary:     Effort: Pulmonary effort is normal.     Breath sounds: Normal breath sounds.  Abdominal:     General: Bowel sounds are normal.     Palpations: Abdomen is soft.  Musculoskeletal:        General: Normal range of motion.  Skin:    General: Skin is warm.  Neurological:     Mental Status: He is alert and oriented to person, place, and time. Mental status is at baseline.  Psychiatric:        Mood and Affect: Mood normal.        Behavior: Behavior normal.        Thought Content: Thought content normal.        Judgment: Judgment normal.      LABORATORY DATA:  I have reviewed the labs as listed.  CBC    Component Value Date/Time   WBC 14.7 (H) 07/09/2020 1040   RBC 4.64 07/09/2020 1040   HGB 14.2 07/09/2020 1040   HCT 44.8 07/09/2020 1040   PLT 350 07/09/2020 1040   MCV 96.6 07/09/2020 1040   MCH 30.6 07/09/2020 1040   MCHC 31.7 07/09/2020 1040   RDW 12.5 07/09/2020 1040   LYMPHSABS 7.4 (H)  07/09/2020 1040   MONOABS 1.1 (H) 07/09/2020 1040   EOSABS 0.6 (H) 07/09/2020 1040   BASOSABS 0.1 07/09/2020 1040   CMP Latest Ref Rng & Units 07/09/2020 01/03/2020 06/20/2019  Glucose 70 - 99 mg/dL 109(H) 88 101(H)  BUN 8 - 23 mg/dL 19 18 13   Creatinine 0.61 - 1.24 mg/dL 1.15 1.09 1.13  Sodium 135 - 145 mmol/L 137 140 143  Potassium 3.5 - 5.1 mmol/L 4.2 4.0 4.2  Chloride 98 - 111 mmol/L 106 107 109  CO2 22 - 32 mmol/L 24 26 26   Calcium 8.9 - 10.3 mg/dL 8.6(L) 8.8(L) 8.8(L)  Total Protein 6.5 - 8.1 g/dL 6.3(L) 6.6 6.3(L)  Total Bilirubin 0.3 - 1.2 mg/dL 0.9 0.7 1.4(H)  Alkaline Phos 38 - 126 U/L 52 54 56  AST 15 - 41 U/L 20 20 20   ALT 0 -  44 U/L 20 21 18     All questions were answered to patient's stated satisfaction. Encouraged patient to call with any new concerns or questions before his next visit to the cancer center and we can certain see him sooner, if needed.     ASSESSMENT & PLAN:  1.  Stage 0 CLL: - He was diagnosed by flow on 12/13/2014 - Today's examination did not reveal any palpable adenopathy or splenomegaly. -He denies any recurrent infections or hospitalizations. -He denies any fevers, chills, night sweats or unexplained weight loss.  No adenopathy. -Labs on 07/09/20 showed his WBC at 14.7 hemoglobin was 14.2, and platelets were 350. -No indication for treatment at this time.  We will continue to monitor labs. -We will see him back in 6 months for follow-up and repeat blood work and physical exam.  2.  Stage III ascending colon cancer: - He had 2 out of 20 lymph nodes positive. -Status post right hemicolectomy on 02/29/2012, could not tolerate adjuvant Xeloda. - EGD showed multiple benign gastric polyps, otherwise within normal limits. -He is completed 5 years of surveillance.  No more scans needed at this time. -He had a colonoscopy on 11/10/2017 which showed radiation proctitis changes.  Diverticulosis in the sigmoid colon.  One polyp in the ascending colon which was removed - Labs on 07/09/2020 showed his CEA level at 1.5 and normal LDH.  -Had new onset abdominal pain.  Followed by Dr. Gala Romney.  Initially stopped several medications to see if symptoms resolved.  Unfortunately, they did not so he will be having a CT scan at the end of the month.  He will follow with GI.  Normal CEA is reassuring.  3.  Prostate cancer: -Underwent radiation [facility] at [facility] in 2011. -He also underwent a TURP x2. - Labs on 01/03/2020 showed his PSA level at 0.16 -He follows up with Dr. Nevada Crane his urologist in [facility]. -We will continue to monitor PSA levels.  Disposition: -RTC in 6 months for repeat lab work  (CEA, PSA, CBC, CMP, LDH, ferritin, iron, vitamin B12, folate and vitamin D) and assessment.  Greater than 50% was spent in counseling and coordination of care with this patient including but not limited to discussion of the relevant topics above (See A&P) including, but not limited to diagnosis and management of acute and chronic medical conditions.   No problem-specific Assessment & Plan notes found for this encounter.  Orders placed this encounter:  No orders of the defined types were placed in this encounter.  Faythe Casa, NP 07/16/2020 10:08 AM  [place] [phone]

## 2020-07-16 ENCOUNTER — Ambulatory Visit (HOSPITAL_COMMUNITY): Payer: TRICARE For Life (TFL) | Admitting: Nurse Practitioner

## 2020-07-25 ENCOUNTER — Ambulatory Visit (HOSPITAL_COMMUNITY)
Admission: RE | Admit: 2020-07-25 | Discharge: 2020-07-25 | Disposition: A | Discharge status: Home / Self Care | Payer: Medicare PPO | Source: Ambulatory Visit | Attending: Internal Medicine | Admitting: Internal Medicine

## 2020-07-25 ENCOUNTER — Other Ambulatory Visit: Payer: Self-pay

## 2020-07-25 DIAGNOSIS — R1013 Epigastric pain: Secondary | ICD-10-CM | POA: Insufficient documentation

## 2020-07-25 DIAGNOSIS — C182 Malignant neoplasm of ascending colon: Secondary | ICD-10-CM | POA: Diagnosis present

## 2020-07-25 DIAGNOSIS — K439 Ventral hernia without obstruction or gangrene: Secondary | ICD-10-CM | POA: Diagnosis present

## 2020-07-25 MED ORDER — IOHEXOL 300 MG/ML  SOLN
100.0000 mL | Freq: Once | INTRAMUSCULAR | Status: AC | PRN
  Administered 2020-07-25: 100 mL via INTRAVENOUS

## 2020-07-28 ENCOUNTER — Encounter: Payer: Self-pay | Admitting: Internal Medicine

## 2020-08-18 ENCOUNTER — Ambulatory Visit: Payer: TRICARE For Life (TFL) | Admitting: Cardiology

## 2020-08-18 NOTE — Progress Notes (Deleted)
Cardiology Office Note  Date: 08/18/2020   ID: David Fuhrmann., DOB [birth year], MRN [id number]  PCP:  Leeanne Rio, MD  Cardiologist:  Rozann Lesches, MD Electrophysiologist:  None   No chief complaint on file.   History of Present Illness: David Lafoy. is an 85 y.o. male last seen in June.  Past Medical History:  Diagnosis Date  . Abnormality of gait 12/18/2013  . Arthritis   . Bilateral foot-drop 04/02/2019  . Biliary dyskinesia   . CAD (coronary artery disease) 2012   a. DES x2 in ramus/OM and mid LAD in 2012  b. balloon angioplaty of OM 2015  . CLL (chronic lymphocytic leukemia) ([place])   . Colon cancer [facility]) 2013   Right hemicolectomy, did not tolerate chemotherapy  . Colon polyps    tubular adenomas  . Diverticula of colon 2010  . Essential hypertension   . Exposure to [employer]   . Folic acid deficiency   . Gastroparesis 2014  . GERD (gastroesophageal reflux disease)   . Glaucoma   . Heart palpitations 2014  . Hiatal hernia 2010  . History of anemia as a child   . History of kidney stones   . Iron deficiency 2013  . Myocardial infarction ([place]) 2012  . Peripheral neuropathy   . Prostate cancer [facility]) 2010   Radiation, surgery  . PTSD (post-traumatic stress disorder)   . Radiation proctitis   . Rosacea     Past Surgical History:  Procedure Laterality Date  . CARDIAC CATHETERIZATION  02/13/2011   [facility], [facility] cardiology  . CATARACT EXTRACTION     2011  . CATARACT EXTRACTION W/PHACO Right 08/06/2013   Procedure: CATARACT EXTRACTION PHACO AND INTRAOCULAR LENS PLACEMENT (IOC);  Surgeon: Tonny Branch, MD;  Location: [facility] ORS;  Service: Ophthalmology;  Laterality: Right;  CDE:20.96  . CHOLECYSTECTOMY    . COLONOSCOPY  02/12/2004   Dr. Gala Romney- L side diverticula, inflammatory  colon polyps  . COLONOSCOPY  01/19/2012   RMR: Prominent changes involving the rectal mucosa consistent with radiation-induced proctitis. Multiple colonic  polyps  removed as described above. Sigmoid Diverticulosis/ 1.5 x 2 cm relatively flat ulcerated lesion in cecum/path showed adenocarcinoma of the colon. descending colon polyp with tubular adenoma and one fragment of focal high grade dysplasia.   . COLONOSCOPY  08/28/2012   IRS:WNIOEVOJJ proctitis. Colonic diverticulosis/Ulcerations at the surgical anastomosis  path: ulcerated colonic mucosa with prolapse changes, tubular adenoma.   . COLONOSCOPY N/A 08/02/2013   RMR: Colonic polyps -removed as described above. Colonic diverticulosis. Status post reight hemicolectomy. Radiation proctitis- asymptomatic.   Marland Kitchen COLONOSCOPY N/A 05/09/2015   Procedure: COLONOSCOPY;  Surgeon: Daneil Dolin, MD;  [facility];  Service: Endoscopy;  Laterality: N/A;  1200-moved to [address]5 [address] to notify pt  . COLONOSCOPY WITH PROPOFOL N/A 11/10/2017   Procedure: COLONOSCOPY WITH PROPOFOL;  Surgeon: Daneil Dolin, MD;  [facility];  Service: Endoscopy;  Laterality: N/A;  . CORONARY ANGIOPLASTY  06/17/14   OM1 POBA  . CORONARY STENT PLACEMENT  01/2011   2.5x26mm Xience drug-eluting stent in ramus intermedius/OMI vessel, 2.5x30mm Promus Element stent in mid LAD artery  . ESOPHAGOGASTRODUODENOSCOPY  10/10/2008   Dr. Tilda Burrow hiatal hernia, normal esophagus, normal stomach  . ESOPHAGOGASTRODUODENOSCOPY  02/12/2004   Dr. Dudley Major- normal   . ESOPHAGOGASTRODUODENOSCOPY N/A 05/09/2015   Procedure: ESOPHAGOGASTRODUODENOSCOPY (EGD);  Surgeon: Daneil Dolin, MD;  [facility];  Service: Endoscopy;  Laterality: N/A;  .  ESOPHAGOGASTRODUODENOSCOPY (EGD) WITH PROPOFOL N/A 11/10/2017   Procedure: ESOPHAGOGASTRODUODENOSCOPY (EGD) WITH PROPOFOL;  Surgeon: Daneil Dolin, MD;  [facility];  Service: Endoscopy;  Laterality: N/A;  9:45am  . GIVENS CAPSULE STUDY N/A 06/03/2015   Procedure: GIVENS CAPSULE STUDY;  Surgeon: Daneil Dolin, MD;  [facility];  Service: Endoscopy;  Laterality: N/A;  0700  .  HOT HEMOSTASIS  11/10/2017   Procedure: HOT HEMOSTASIS (ARGON PLASMA COAGULATION/BICAP);  Surgeon: Daneil Dolin, MD;  [facility];  Service: Endoscopy;;  rectum  . KNEE SURGERY     left knee   . LEFT HEART CATHETERIZATION WITH CORONARY ANGIOGRAM N/A 06/17/2014   Procedure: LEFT HEART CATHETERIZATION WITH CORONARY ANGIOGRAM;  Surgeon: Burnell Blanks, MD;  Location: [facility] CATH LAB;  Service: Cardiovascular;  Laterality: N/A;  . PARTIAL COLECTOMY  02/29/2012   Procedure: PARTIAL COLECTOMY;  Surgeon: Adin Hector, MD;  [facility];  Service: General;  Laterality: Right;  . POLYPECTOMY  11/10/2017   Procedure: POLYPECTOMY;  Surgeon: Daneil Dolin, MD;  [facility];  Service: Endoscopy;;  gastric colon  . PROSTATECTOMY    . TONSILLECTOMY    . TRANSURETHRAL RESECTION OF PROSTATE    . VASECTOMY      Current Outpatient Medications  Medication Sig Dispense Refill  . ALPRAZolam (XANAX) 0.5 MG tablet Take 0.5 mg by mouth daily as needed for anxiety.    Marland Kitchen aspirin EC 81 MG tablet Take 81 mg by mouth daily.    . B Complex Vitamins (VITAMIN B COMPLEX PO) Take 1 tablet by mouth at bedtime.     . cholecalciferol (VITAMIN D3) 25 MCG (1000 UNIT) tablet Take 1,000 Units by mouth 2 (two) times daily.    . citalopram (CELEXA) 20 MG tablet Take 10 mg by mouth daily.     . clopidogrel (PLAVIX) 75 MG tablet TAKE 1 TABLET BY MOUTH ONCE DAILY. 30 tablet 11  . fluticasone (FLONASE) 50 MCG/ACT nasal spray Place 1 spray into both nostrils at bedtime.    . folic acid (FOLVITE) 1 MG tablet TAKE (1) TABLET BY MOUTH ONCE DAILY . (Patient taking differently: Take 1 mg by mouth daily. ) 30 tablet 0  . ibuprofen (ADVIL,MOTRIN) 200 MG tablet Take 400 mg by mouth daily as needed for headache or moderate pain.    Marland Kitchen latanoprost (XALATAN) 0.005 % ophthalmic solution Place 1 drop into both eyes at bedtime. 2.5 mL 3  . loperamide (IMODIUM) 2 MG capsule Take 2 mg by mouth as needed for diarrhea  or loose stools.    Marland Kitchen losartan (COZAAR) 50 MG tablet Take 25 mg by mouth 2 (two) times daily.     . metoprolol (LOPRESSOR) 50 MG tablet Take 50 mg by mouth 2 (two) times daily.     . montelukast (SINGULAIR) 10 MG tablet Take 10 mg by mouth at bedtime.    . Multiple Vitamin (MULTIVITAMIN WITH MINERALS) TABS tablet Take 1 tablet by mouth daily.    . nitroGLYCERIN (NITROSTAT) 0.4 MG SL tablet PLACE ONE (1) TABLET UNDER TONGUE EVERY 5 MINUTES UP TO (3) DOSES AS NEEDED FOR CHEST PAIN. 25 tablet 3  . OVER THE COUNTER MEDICATION Instaflex one tablet daily    . pantoprazole (PROTONIX) 40 MG tablet Take 1 tablet (40 mg total) by mouth 2 (two) times daily. 60 tablet 3  . pravastatin (PRAVACHOL) 40 MG tablet TAKE 1 TABLET BY MOUTH AT BEDTIME. 30 tablet 6  . vitamin  B-12 (CYANOCOBALAMIN) 1000 MCG tablet Take by mouth daily.     . Wheat Dextrin (BENEFIBER) POWD Take by mouth as needed.      No current facility-administered medications for this visit.   Allergies:  Shellfish allergy and Sulfonamide derivatives   Social History: The patient  reports that he has never smoked. He has never used smokeless tobacco. He reports current alcohol use. He reports that he does not use drugs.   Family History: The patient's family history includes Cancer in his paternal uncle; Heart attack in his paternal grandfather; Melanoma in his brother; Neuropathy in his brother; Prostate cancer (age of onset: 75) in his brother; Stomach cancer (age of onset: 19) in his cousin; Throat cancer in his father.   ROS:  Please see the history of present illness. Otherwise, complete review of systems is positive for {NONE DEFAULTED:18576::"none"}.  All other systems are reviewed and negative.   Physical Exam: VS:  There were no vitals taken for this visit., BMI There is no height or weight on file to calculate BMI.  Wt Readings from Last 3 Encounters:  07/14/20 222 lb (100.7 kg)  07/01/20 219 lb 3.2 oz (99.4 kg)  02/21/20 219 lb  14.4 oz (99.7 kg)    General: Patient appears comfortable at rest. HEENT: Conjunctiva and lids normal, oropharynx clear with moist mucosa. Neck: Supple, no elevated JVP or carotid bruits, no thyromegaly. Lungs: Clear to auscultation, nonlabored breathing at rest. Cardiac: Regular rate and rhythm, no S3 or significant systolic murmur, no pericardial rub. Abdomen: Soft, nontender, no hepatomegaly, bowel sounds present, no guarding or rebound. Extremities: No pitting edema, distal pulses 2+. Skin: Warm and dry. Musculoskeletal: No kyphosis. Neuropsychiatric: Alert and oriented x3, affect grossly appropriate.  ECG:  An ECG dated 08/09/2019 was personally reviewed today and demonstrated:  Sinus bradycardia.  Recent Labwork: 07/09/2020: ALT 20; AST 20; BUN 19; Creatinine, Ser 1.15; Hemoglobin 14.2; Platelets 350; Potassium 4.2; Sodium 137  July 2020: Cholesterol 119, triglycerides 96, HDL 55, LDL 45  Other Studies Reviewed Today:  No interval cardiac testing for review today.  Assessment and Plan:    Medication Adjustments/Labs and Tests Ordered: Current medicines are reviewed at length with the patient today.  Concerns regarding medicines are outlined above.   Tests Ordered: No orders of the defined types were placed in this encounter.   Medication Changes: No orders of the defined types were placed in this encounter.   Disposition:  Follow up {follow up:15908}  Signed, Satira Sark, MD, [facility] 08/18/2020 12:45 PM    [place] [facility] [place], [place], [place] [address] Phone: [phone]; Fax: [phone]

## 2020-08-20 ENCOUNTER — Other Ambulatory Visit: Payer: Self-pay

## 2020-08-20 ENCOUNTER — Encounter: Payer: Self-pay | Admitting: Cardiology

## 2020-08-20 ENCOUNTER — Ambulatory Visit (INDEPENDENT_AMBULATORY_CARE_PROVIDER_SITE_OTHER): Payer: Medicare PPO | Admitting: Cardiology

## 2020-08-20 VITALS — BP 150/82 | HR 56 | Ht 70.5 in | Wt 223.0 lb

## 2020-08-20 DIAGNOSIS — I25119 Atherosclerotic heart disease of native coronary artery with unspecified angina pectoris: Secondary | ICD-10-CM | POA: Diagnosis not present

## 2020-08-20 DIAGNOSIS — E782 Mixed hyperlipidemia: Secondary | ICD-10-CM

## 2020-08-20 MED ORDER — NITROGLYCERIN 0.4 MG SL SUBL: SUBLINGUAL_TABLET | SUBLINGUAL | 3 refills | Status: DC

## 2020-08-20 NOTE — Progress Notes (Signed)
Cardiology Office Note  Date: 08/20/2020   ID: David Anders., DOB Feb 04, 1935, MRN [id number]  PCP:  Leeanne Rio, MD  Cardiologist:  Rozann Lesches, MD Electrophysiologist:  None   Chief Complaint  Patient presents with  . Cardiac follow-up    History of Present Illness: David Flaugh. is an 85 y.o. male last seen in June.  He presents for a routine visit.  Overall doing well from a cardiac perspective without active angina or nitroglycerin use, in fact needs a refill for her old bottle.  He remains functional with ADLs.  I reviewed his medications which are outlined below.  Cardiac regimen is stable, he reports no active bleeding problems on aspirin and Plavix.  No significant intolerance to Pravachol.  His LDL has been well controlled over time, generally down in the 40s.  He continues to follow with Dr. Huel Cote.  Past Medical History:  Diagnosis Date  . Abnormality of gait 12/18/2013  . Arthritis   . Bilateral foot-drop 04/02/2019  . Biliary dyskinesia   . CAD (coronary artery disease) 2012   a. DES x2 in ramus/OM and mid LAD in 2012  b. balloon angioplaty of OM 2015  . CLL (chronic lymphocytic leukemia) ([place])   . Colon cancer [facility]) 2013   Right hemicolectomy, did not tolerate chemotherapy  . Colon polyps    tubular adenomas  . Diverticula of colon 2010  . Essential hypertension   . Exposure to [employer]   . Folic acid deficiency   . Gastroparesis 2014  . GERD (gastroesophageal reflux disease)   . Glaucoma   . Heart palpitations 2014  . Hiatal hernia 2010  . History of anemia as a child   . History of kidney stones   . Iron deficiency 2013  . Myocardial infarction ([place]) 2012  . Peripheral neuropathy   . Prostate cancer [facility]) 2010   Radiation, surgery  . PTSD (post-traumatic stress disorder)   . Radiation proctitis   . Rosacea     Past Surgical History:  Procedure Laterality Date  . CARDIAC CATHETERIZATION  02/13/2011   [facility] cardiology  . CATARACT EXTRACTION     2011  . CATARACT EXTRACTION W/PHACO Right 08/06/2013   Procedure: CATARACT EXTRACTION PHACO AND INTRAOCULAR LENS PLACEMENT (IOC);  Surgeon: Tonny Branch, MD;  Location: [facility];  Service: Ophthalmology;  Laterality: Right;  CDE:20.96  . CHOLECYSTECTOMY    . COLONOSCOPY  02/12/2004   Dr. Gala Romney- L side diverticula, inflammatory  colon polyps  . COLONOSCOPY  01/19/2012   RMR: Prominent changes involving the rectal mucosa consistent with radiation-induced proctitis. Multiple colonic  polyps removed as described above. Sigmoid Diverticulosis/ 1.5 x 2 cm relatively flat ulcerated lesion in cecum/path showed adenocarcinoma of the colon. descending colon polyp with tubular adenoma and one fragment of focal high grade dysplasia.   . COLONOSCOPY  08/28/2012   [id number] proctitis. Colonic diverticulosis/Ulcerations at the surgical anastomosis  path: ulcerated colonic mucosa with prolapse changes, tubular adenoma.   . COLONOSCOPY N/A 08/02/2013   RMR: Colonic polyps -removed as described above. Colonic diverticulosis. Status post reight hemicolectomy. Radiation proctitis- asymptomatic.   Marland Kitchen COLONOSCOPY N/A 05/09/2015   Procedure: COLONOSCOPY;  Surgeon: Daneil Dolin, MD;  [facility];  Service: Endoscopy;  Laterality: N/A;  1200-moved to 1145 [address] to notify pt  . COLONOSCOPY WITH PROPOFOL N/A 11/10/2017   Procedure: COLONOSCOPY WITH PROPOFOL;  Surgeon: Daneil Dolin, MD;  [facility];  Service: Endoscopy;  Laterality: N/A;  . CORONARY ANGIOPLASTY  06/17/14   OM1 POBA  . CORONARY STENT PLACEMENT  01/2011   2.5x17mm Xience drug-eluting stent in ramus intermedius/OMI vessel, 2.5x51mm Promus Element stent in mid LAD artery  . ESOPHAGOGASTRODUODENOSCOPY  10/10/2008   Dr. Tilda Burrow hiatal hernia, normal esophagus, normal stomach  . ESOPHAGOGASTRODUODENOSCOPY  02/12/2004   Dr. Dudley Major- normal   . ESOPHAGOGASTRODUODENOSCOPY N/A 05/09/2015    Procedure: ESOPHAGOGASTRODUODENOSCOPY (EGD);  Surgeon: Daneil Dolin, MD;  [facility];  Service: Endoscopy;  Laterality: N/A;  . ESOPHAGOGASTRODUODENOSCOPY (EGD) WITH PROPOFOL N/A 11/10/2017   Procedure: ESOPHAGOGASTRODUODENOSCOPY (EGD) WITH PROPOFOL;  Surgeon: Daneil Dolin, MD;  [facility];  Service: Endoscopy;  Laterality: N/A;  9:45am  . GIVENS CAPSULE STUDY N/A 06/03/2015   Procedure: GIVENS CAPSULE STUDY;  Surgeon: Daneil Dolin, MD;  [facility];  Service: Endoscopy;  Laterality: N/A;  0700  . HOT HEMOSTASIS  11/10/2017   Procedure: HOT HEMOSTASIS (ARGON PLASMA COAGULATION/BICAP);  Surgeon: Daneil Dolin, MD;  [facility];  Service: Endoscopy;;  rectum  . KNEE SURGERY     left knee   . LEFT HEART CATHETERIZATION WITH CORONARY ANGIOGRAM N/A 06/17/2014   Procedure: LEFT HEART CATHETERIZATION WITH CORONARY ANGIOGRAM;  Surgeon: Burnell Blanks, MD;  Location: [facility] CATH LAB;  Service: Cardiovascular;  Laterality: N/A;  . PARTIAL COLECTOMY  02/29/2012   Procedure: PARTIAL COLECTOMY;  Surgeon: Adin Hector, MD;  [facility];  Service: General;  Laterality: Right;  . POLYPECTOMY  11/10/2017   Procedure: POLYPECTOMY;  Surgeon: Daneil Dolin, MD;  [facility];  Service: Endoscopy;;  gastric colon  . PROSTATECTOMY    . TONSILLECTOMY    . TRANSURETHRAL RESECTION OF PROSTATE    . VASECTOMY      Current Outpatient Medications  Medication Sig Dispense Refill  . aspirin EC 81 MG tablet Take 81 mg by mouth daily.    . B Complex Vitamins (VITAMIN B COMPLEX PO) Take 1 tablet by mouth at bedtime.     . cholecalciferol (VITAMIN D3) 25 MCG (1000 UNIT) tablet Take 1,000 Units by mouth 2 (two) times daily.    . citalopram (CELEXA) 20 MG tablet Take 10 mg by mouth daily.     . clopidogrel (PLAVIX) 75 MG tablet TAKE 1 TABLET BY MOUTH ONCE DAILY. 30 tablet 11  . fluticasone (FLONASE) 50 MCG/ACT nasal spray Place 1 spray into both  nostrils at bedtime.    . folic acid (FOLVITE) 1 MG tablet TAKE (1) TABLET BY MOUTH ONCE DAILY . (Patient taking differently: Take 1 mg by mouth daily.) 30 tablet 0  . ibuprofen (ADVIL,MOTRIN) 200 MG tablet Take 400 mg by mouth daily as needed for headache or moderate pain.    Marland Kitchen latanoprost (XALATAN) 0.005 % ophthalmic solution Place 1 drop into both eyes at bedtime. 2.5 mL 3  . loperamide (IMODIUM) 2 MG capsule Take 2 mg by mouth as needed for diarrhea or loose stools.    Marland Kitchen losartan (COZAAR) 50 MG tablet Take 25 mg by mouth 2 (two) times daily.     . metoprolol (LOPRESSOR) 50 MG tablet Take 50 mg by mouth 2 (two) times daily.    . montelukast (SINGULAIR) 10 MG tablet Take 10 mg by mouth at bedtime.    . Multiple Vitamin (MULTIVITAMIN WITH MINERALS) TABS tablet Take 1 tablet by mouth daily.    Marland Kitchen OVER THE COUNTER MEDICATION Instaflex one tablet  daily    . pantoprazole (PROTONIX) 40 MG tablet Take 1 tablet (40 mg total) by mouth 2 (two) times daily. 60 tablet 3  . pravastatin (PRAVACHOL) 40 MG tablet TAKE 1 TABLET BY MOUTH AT BEDTIME. 30 tablet 6  . vitamin B-12 (CYANOCOBALAMIN) 1000 MCG tablet Take by mouth daily.     Marland Kitchen ALPRAZolam (XANAX) 0.5 MG tablet Take 0.5 mg by mouth daily as needed for anxiety. (Patient not taking: Reported on 08/20/2020)    . nitroGLYCERIN (NITROSTAT) 0.4 MG SL tablet PLACE ONE (1) TABLET UNDER TONGUE EVERY 5 MINUTES UP TO (3) DOSES AS NEEDED FOR CHEST PAIN. 25 tablet 3   No current facility-administered medications for this visit.   Allergies:  Shellfish allergy and Sulfonamide derivatives   ROS: No palpitations or syncope.  Physical Exam: VS:  BP (!) 150/82   Pulse (!) 56   Ht 5' 10.5" (1.791 m)   Wt 223 lb (101.2 kg)   SpO2 97%   BMI 31.54 kg/m , BMI Body mass index is 31.54 kg/m.  Wt Readings from Last 3 Encounters:  08/20/20 223 lb (101.2 kg)  07/14/20 222 lb (100.7 kg)  07/01/20 219 lb 3.2 oz (99.4 kg)    General: Elderly male, appears comfortable at  rest. HEENT: Conjunctiva and lids normal, wearing a mask. Neck: Supple, no elevated JVP or carotid bruits, no thyromegaly. Lungs: Clear to auscultation, nonlabored breathing at rest. Cardiac: Regular rate and rhythm, no S3, soft systolic murmur. Extremities: No pitting edema, distal pulses 2+.  ECG:  An ECG dated 08/09/2019 was personally reviewed today and demonstrated:  Sinus bradycardia, rule out old inferior infarct pattern.  Recent Labwork: 07/09/2020: ALT 20; AST 20; BUN 19; Creatinine, Ser 1.15; Hemoglobin 14.2; Platelets 350; Potassium 4.2; Sodium 137     Component Value Date/Time   CHOL 133 04/27/2017 0908   TRIG 106 04/27/2017 0908   HDL 63 04/27/2017 0908   CHOLHDL 2.1 04/27/2017 0908   CHOLHDL 1.7 05/05/2016 0840   VLDL 15 05/05/2016 0840   LDLCALC 49 04/27/2017 0908  July 2020: Cholesterol 119, triglycerides 96, HDL 55, LDL 45  Other Studies Reviewed Today:  No interval cardiac testing for review today.  Assessment and Plan:  1.  CAD status post DES x2 to the ramus intermedius/obtuse marginal system as well as mid LAD in 2012.  Subsequent angioplasty of the obtuse marginal performed in 2015.  We continue with observation on medical therapy in the absence of angina symptoms.  Refill provided for fresh bottle of nitroglycerin.  Otherwise continue aspirin, Plavix, Cozaar, Lopressor, and Pravachol.  2.  Mixed hyperlipidemia, tolerating Pravachol with good LDL control over time.  Medication Adjustments/Labs and Tests Ordered: Current medicines are reviewed at length with the patient today.  Concerns regarding medicines are outlined above.   Tests Ordered: Orders Placed This Encounter  Procedures  . EKG 12-Lead    Medication Changes: Meds ordered this encounter  Medications  . DISCONTD: nitroGLYCERIN (NITROSTAT) 0.4 MG SL tablet    Sig: PLACE ONE (1) TABLET UNDER TONGUE EVERY 5 MINUTES UP TO (3) DOSES AS NEEDED FOR CHEST PAIN.    Dispense:  25 tablet    Refill:   3  . nitroGLYCERIN (NITROSTAT) 0.4 MG SL tablet    Sig: PLACE ONE (1) TABLET UNDER TONGUE EVERY 5 MINUTES UP TO (3) DOSES AS NEEDED FOR CHEST PAIN.    Dispense:  25 tablet    Refill:  3    Disposition:  Follow up 6  months in the [place] office.  Signed, Satira Sark, MD, [facility] 08/20/2020 3:58 PM    [place] at [facility] [address], [place], [place] [address] Phone: [phone]; Fax: [phone]

## 2020-08-20 NOTE — Patient Instructions (Addendum)
Medication Instructions:  °Your physician recommends that you continue on your current medications as directed. Please refer to the Current Medication list given to you today. ° °*If you need a refill on your cardiac medications before your next appointment, please call your pharmacy* ° ° °Lab Work: °None today °If you have labs (blood work) drawn today and your tests are completely normal, you will receive your results only by: °• MyChart Message (if you have MyChart) OR °• A paper copy in the mail °If you have any lab test that is abnormal or we need to change your treatment, we will call you to review the results. ° ° °Testing/Procedures: °None today ° ° °Follow-Up: °At [facility], you and your health needs are our priority.  As part of our continuing mission to provide you with exceptional heart care, we have created designated Provider Care Teams.  These Care Teams include your primary Cardiologist (physician) and Advanced Practice Providers (APPs -  Physician Assistants and Nurse Practitioners) who all work together to provide you with the care you need, when you need it. ° °We recommend signing up for the patient portal called "MyChart".  Sign up information is provided on this After Visit Summary.  MyChart is used to connect with patients for Virtual Visits (Telemedicine).  Patients are able to view lab/test results, encounter notes, upcoming appointments, etc.  Non-urgent messages can be sent to your provider as well.   °To learn more about what you can do with MyChart, go to [email or web address].   ° °Your next appointment:   °6 month(s) ° °The format for your next appointment:   °In Person ° °Provider:   °Samuel McDowell, MD ° ° °Other Instructions °None ° ° ° ° °Thank you for choosing [facility] ! ° ° ° ° ° ° ° ° °

## 2020-09-04 ENCOUNTER — Other Ambulatory Visit: Payer: Self-pay

## 2020-09-04 ENCOUNTER — Telehealth: Payer: Self-pay | Admitting: Cardiology

## 2020-09-04 DIAGNOSIS — I2510 Atherosclerotic heart disease of native coronary artery without angina pectoris: Secondary | ICD-10-CM

## 2020-09-04 DIAGNOSIS — I25119 Atherosclerotic heart disease of native coronary artery with unspecified angina pectoris: Secondary | ICD-10-CM

## 2020-09-04 NOTE — Telephone Encounter (Signed)
If his blood pressure is normal and he is not having any other active cardiac symptoms, this could be related to his inner ear.  Since he does have vascular disease, let's set him up for carotid Dopplers just to make sure he does not have any significant carotid stenosis.

## 2020-09-04 NOTE — Telephone Encounter (Signed)
Called patient to let him know of Dr. Orson Gear suggestion. He verbalized understanding and agreement.

## 2020-09-04 NOTE — Telephone Encounter (Signed)
Contacted patient. He states that he is not having cp's or sob. He states that he has checked his b/p and it remains within normal range and he continues to exercise and takes his medication daily. Patient said he was just a little concerned when it started happening and ask that I let Dr. Diona Browner know to see what he recommends.

## 2020-09-04 NOTE — Telephone Encounter (Signed)
Patient called stating he can hear his heart beating in his ears no matter what he's doing he can hear it. States it's 4-5 beats and he'll hear it and then it will stop and then in about a half hr later he will hear it again.   It's been going on for around 3 days.   States no other symptoms  [phone]

## 2020-09-11 ENCOUNTER — Ambulatory Visit (HOSPITAL_COMMUNITY)
Admission: RE | Admit: 2020-09-11 | Discharge: 2020-09-11 | Disposition: A | Discharge status: Home / Self Care | Payer: Medicare PPO | Source: Ambulatory Visit | Attending: Cardiology | Admitting: Cardiology

## 2020-09-11 ENCOUNTER — Other Ambulatory Visit: Payer: Self-pay

## 2020-09-11 DIAGNOSIS — I2510 Atherosclerotic heart disease of native coronary artery without angina pectoris: Secondary | ICD-10-CM | POA: Diagnosis present

## 2020-10-07 ENCOUNTER — Other Ambulatory Visit: Payer: Self-pay

## 2020-10-07 ENCOUNTER — Ambulatory Visit (INDEPENDENT_AMBULATORY_CARE_PROVIDER_SITE_OTHER): Payer: Medicare PPO | Admitting: Internal Medicine

## 2020-10-07 ENCOUNTER — Encounter: Payer: Self-pay | Admitting: Internal Medicine

## 2020-10-07 VITALS — BP 118/70 | HR 64 | Temp 96.9°F | Ht 70.5 in | Wt 219.6 lb

## 2020-10-07 DIAGNOSIS — K219 Gastro-esophageal reflux disease without esophagitis: Secondary | ICD-10-CM

## 2020-10-07 DIAGNOSIS — K439 Ventral hernia without obstruction or gangrene: Secondary | ICD-10-CM

## 2020-10-07 DIAGNOSIS — R1013 Epigastric pain: Secondary | ICD-10-CM

## 2020-10-07 DIAGNOSIS — A048 Other specified bacterial intestinal infections: Secondary | ICD-10-CM

## 2020-10-07 NOTE — Patient Instructions (Signed)
Stop Protonix x2 weeks  We will get a stool antigen test done in 2 weeks then you can resume Protonix  CT of the abdomen and pelvis with contrast to follow-up on lymph nodes seen previously (can schedule in the next 4 weeks)  Continue famotidine/Pepcid  Further recommendations to follow.
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
## 2020-10-23 ENCOUNTER — Telehealth: Payer: Self-pay | Admitting: Internal Medicine

## 2020-10-23 LAB — HELICOBACTER PYLORI  SPECIAL ANTIGEN
MICRO NUMBER:: 11570993
SPECIMEN QUALITY: ADEQUATE

## 2020-10-23 NOTE — Telephone Encounter (Signed)
Recall sent 

## 2020-10-23 NOTE — Telephone Encounter (Signed)
LAST NOTE WAS IN ERROR, ITS RECALL FOR CT

## 2020-10-23 NOTE — Telephone Encounter (Signed)
RECALL FOR ULTRASOUND 

## 2020-10-29 ENCOUNTER — Other Ambulatory Visit: Payer: Self-pay

## 2020-10-29 DIAGNOSIS — R935 Abnormal findings on diagnostic imaging of other abdominal regions, including retroperitoneum: Secondary | ICD-10-CM

## 2020-10-29 NOTE — Progress Notes (Unsigned)
c 

## 2020-11-10 ENCOUNTER — Telehealth: Payer: Self-pay

## 2020-11-10 NOTE — Telephone Encounter (Signed)
Spoke with pt. Pt reports no fever. Pt was given recommendations of clear liquids for 24 hrs, Imodium 4 times daily. Pt will drink clear liquids to avoid dehydration.

## 2020-11-10 NOTE — Telephone Encounter (Signed)
Communication noted.  

## 2020-11-10 NOTE — Telephone Encounter (Signed)
Spoke with pt. Pt states that he feels he has a stomach bug. Pt has vomiting and diarrhea that started around 6 pm last night. Pt didn't get to finish his sandwich before getting sick. Pt was having abdominal pain which has gone away. Pts stool was thicker and is now watery. Pt has had 13 watery stools between last night and today. Pt would like to know what he can take for vomiting and diarrhea. Pt drunk ginger ale this morning and it has stayed down.

## 2020-11-10 NOTE — Telephone Encounter (Signed)
Any fever?  Recommend clear liquids for another 24 hours.  Would go with Imodium up to 4 times a day to combat diarrhea.  With any luck, symptoms will resolve over the next 24 to 48 hours. Maintaining clear liquid intake will reduce the risk of dehydration.

## 2020-11-11 ENCOUNTER — Other Ambulatory Visit: Payer: Self-pay

## 2020-11-11 MED ORDER — ONDANSETRON HCL 4 MG PO TABS: 4.0000 mg | ORAL_TABLET | Freq: Four times a day (QID) | ORAL | 0 refills | Status: DC | PRN

## 2020-11-11 NOTE — Telephone Encounter (Signed)
Lets try Zofran 4 mg orally disintegrating tablets  - put 1 under the tongue every 6 hours as needed for nausea (dispense 20) no refills.  Maintain clear liquids.  Would not try to eat any solid food.  Continue Protonix and Imodium as needed.  If he continues not to be able to keep anything down, he may need to come to the emergency room  Lets check on him tomorrow and see how he is doing.

## 2020-11-11 NOTE — Telephone Encounter (Signed)
Spoke with pt. Pt is taking Imodium as directed. Pt is still having diarrhea and dry heaving at this time. Pt feels acid is coming up. Pt reports no abdominal pain, urine is clear, pt is drinking fluids and they are coming back up. Pt is asking for some help dry heaving, vomiting and diarrhea.

## 2020-11-11 NOTE — Telephone Encounter (Signed)
Spoke with pt. Zofran was sent into pts pharmacy. Pt will avoid solid foods and continue with clear liquids, continue Pantoprazole and Imodium. Pt advised to go to the ED if symptoms worsen.

## 2020-11-13 ENCOUNTER — Other Ambulatory Visit: Payer: Self-pay

## 2020-11-13 ENCOUNTER — Emergency Department (HOSPITAL_COMMUNITY): Payer: Medicare PPO

## 2020-11-13 ENCOUNTER — Encounter (HOSPITAL_COMMUNITY): Payer: Self-pay

## 2020-11-13 ENCOUNTER — Emergency Department (HOSPITAL_COMMUNITY)
Admission: EM | Admit: 2020-11-13 | Discharge: 2020-11-13 | Disposition: A | Discharge status: Home / Self Care | Payer: Medicare PPO | Attending: Emergency Medicine | Admitting: Emergency Medicine

## 2020-11-13 DIAGNOSIS — Z7902 Long term (current) use of antithrombotics/antiplatelets: Secondary | ICD-10-CM | POA: Insufficient documentation

## 2020-11-13 DIAGNOSIS — Z79899 Other long term (current) drug therapy: Secondary | ICD-10-CM | POA: Diagnosis not present

## 2020-11-13 DIAGNOSIS — I2510 Atherosclerotic heart disease of native coronary artery without angina pectoris: Secondary | ICD-10-CM | POA: Diagnosis not present

## 2020-11-13 DIAGNOSIS — I10 Essential (primary) hypertension: Secondary | ICD-10-CM | POA: Insufficient documentation

## 2020-11-13 DIAGNOSIS — R1110 Vomiting, unspecified: Secondary | ICD-10-CM | POA: Diagnosis present

## 2020-11-13 DIAGNOSIS — Z7982 Long term (current) use of aspirin: Secondary | ICD-10-CM | POA: Insufficient documentation

## 2020-11-13 DIAGNOSIS — R197 Diarrhea, unspecified: Secondary | ICD-10-CM | POA: Diagnosis not present

## 2020-11-13 DIAGNOSIS — Z955 Presence of coronary angioplasty implant and graft: Secondary | ICD-10-CM | POA: Insufficient documentation

## 2020-11-13 DIAGNOSIS — Z85038 Personal history of other malignant neoplasm of large intestine: Secondary | ICD-10-CM | POA: Insufficient documentation

## 2020-11-13 LAB — COMPREHENSIVE METABOLIC PANEL
ALT: 32 U/L (ref 0–44)
AST: 35 U/L (ref 15–41)
Albumin: 3.4 g/dL — ABNORMAL LOW (ref 3.5–5.0)
Alkaline Phosphatase: 52 U/L (ref 38–126)
Anion gap: 14 (ref 5–15)
BUN: 16 mg/dL (ref 8–23)
CO2: 18 mmol/L — ABNORMAL LOW (ref 22–32)
Calcium: 8.4 mg/dL — ABNORMAL LOW (ref 8.9–10.3)
Chloride: 106 mmol/L (ref 98–111)
Creatinine, Ser: 1.1 mg/dL (ref 0.61–1.24)
GFR, Estimated: >60 mL/min (ref >60)
Glucose, Bld: 92 mg/dL (ref 70–99)
Potassium: 3.2 mmol/L — ABNORMAL LOW (ref 3.5–5.1)
Sodium: 138 mmol/L (ref 135–145)
Total Bilirubin: 1.2 mg/dL (ref 0.3–1.2)
Total Protein: 6.5 g/dL (ref 6.5–8.1)

## 2020-11-13 LAB — CBC WITH DIFFERENTIAL/PLATELET
Abs Immature Granulocytes: 0.06 10*3/uL (ref 0.00–0.07)
Basophils Absolute: 0.1 10*3/uL (ref 0.0–0.1)
Basophils Relative: 0 %
Eosinophils Absolute: 0.6 10*3/uL — ABNORMAL HIGH (ref 0.0–0.5)
Eosinophils Relative: 5 %
HCT: 45 % (ref 39.0–52.0)
Hemoglobin: 15.1 g/dL (ref 13.0–17.0)
Immature Granulocytes: 1 %
Lymphocytes Relative: 24 %
Lymphs Abs: 2.8 10*3/uL (ref 0.7–4.0)
MCH: 31.4 pg (ref 26.0–34.0)
MCHC: 33.6 g/dL (ref 30.0–36.0)
MCV: 93.6 fL (ref 80.0–100.0)
Monocytes Absolute: 1.2 10*3/uL — ABNORMAL HIGH (ref 0.1–1.0)
Monocytes Relative: 10 %
Neutro Abs: 7 10*3/uL (ref 1.7–7.7)
Neutrophils Relative %: 60 %
Platelets: 341 10*3/uL (ref 150–400)
RBC: 4.81 MIL/uL (ref 4.22–5.81)
RDW: 12.2 % (ref 11.5–15.5)
WBC: 11.6 10*3/uL — ABNORMAL HIGH (ref 4.0–10.5)
nRBC: 0 % (ref 0.0–0.2)

## 2020-11-13 LAB — LIPASE, BLOOD: Lipase: 22 U/L (ref 11–51)

## 2020-11-13 MED ORDER — SODIUM CHLORIDE 0.9 % IV SOLN: INTRAVENOUS | Status: DC

## 2020-11-13 MED ORDER — LOPERAMIDE HCL 2 MG PO CAPS: 2.0000 mg | ORAL_CAPSULE | ORAL | 0 refills | Status: DC | PRN

## 2020-11-13 MED ORDER — ONDANSETRON HCL 4 MG/2ML IJ SOLN
4.0000 mg | Freq: Once | INTRAMUSCULAR | Status: AC
  Administered 2020-11-13: 4 mg via INTRAVENOUS
  Filled 2020-11-13: qty 2

## 2020-11-13 MED ORDER — LOPERAMIDE HCL 2 MG PO CAPS
4.0000 mg | ORAL_CAPSULE | Freq: Once | ORAL | Status: AC
  Administered 2020-11-13: 4 mg via ORAL
  Filled 2020-11-13: qty 2

## 2020-11-13 MED ORDER — SODIUM CHLORIDE 0.9 % IV BOLUS
1000.0000 mL | Freq: Once | INTRAVENOUS | Status: AC
  Administered 2020-11-13: 1000 mL via INTRAVENOUS

## 2020-11-13 NOTE — ED Triage Notes (Signed)
Vomiting and diarrhea since this past Saturday. Rx of zofran not helping, immodium not helping. No fever.

## 2020-11-13 NOTE — Discharge Instructions (Addendum)
Take your Zofran as needed for nausea.  Continue the Imodium for diarrhea.  Follow-up with your doctor if not improving in the week.  Return to the ER for worsening symptoms including dehydration, abdominal pain, fever

## 2020-11-13 NOTE — ED Provider Notes (Signed)
[facility] Provider Note   CSN: [id number] Arrival date & time: 11/13/20  1006     History Chief Complaint  Patient presents with  . Emesis    David Broadus. is a 86 y.o. male.  HPI   Patient presents to the ED for evaluation of vomiting and diarrhea.  Patient states he started having symptoms over the weekend.  He has had numerous episodes of both vomiting and diarrhea.  He thinks he has had maybe 78 episodes of vomiting since it started over the weekend but has had greater than 20 episodes of diarrhea.  Initially the diarrhea was brown and watery and now looks more yellow and watery.  He has not noticed any blood.  Patient denies having any history of abdominal pain.  He is not having any fevers.  He has had issues with similar symptoms in the past and it seems to occur at least once a year.  He denies any recent antibiotics.  No known ill contacts.  Past Medical History:  Diagnosis Date  . Abnormality of gait 12/18/2013  . Arthritis   . Bilateral foot-drop 04/02/2019  . Biliary dyskinesia   . CAD (coronary artery disease) 2012   a. DES x2 in ramus/OM and mid LAD in 2012  b. balloon angioplaty of OM 2015  . CLL (chronic lymphocytic leukemia) ([place])   . Colon cancer [facility]) 2013   Right hemicolectomy, did not tolerate chemotherapy  . Colon polyps    tubular adenomas  . Diverticula of colon 2010  . Essential hypertension   . Exposure to [employer]   . Folic acid deficiency   . Gastroparesis 2014  . GERD (gastroesophageal reflux disease)   . Glaucoma   . Heart palpitations 2014  . Hiatal hernia 2010  . History of anemia as a child   . History of kidney stones   . Iron deficiency 2013  . Myocardial infarction ([place]) 2012  . Peripheral neuropathy   . Prostate cancer [facility]) 2010   Radiation, surgery  . PTSD (post-traumatic stress disorder)   . Radiation proctitis   . Rosacea     Patient Active Problem List   Diagnosis Date Noted  . Central  retinal vein occlusion of left eye 01/21/2020  . Early stage nonexudative age-related macular degeneration of right eye 01/21/2020  . Right epiretinal membrane 01/21/2020  . Cystoid macular edema of left eye 01/21/2020  . Advanced nonexudative age-related macular degeneration of left eye with subfoveal involvement 01/21/2020  . Primary open angle glaucoma of both eyes, mild stage 01/21/2020  . Bilateral foot-drop 04/02/2019  . Decreased appetite 08/18/2017  . Loss of weight 08/18/2017  . Dark stools 08/18/2017  . Morbid obesity ([place]) 05/02/2017  . Vitamin D deficiency 11/26/2015  . CLL (chronic lymphocytic leukemia) ([place]) 07/21/2015  . Mucosal abnormality of esophagus   . Mucosal abnormality of stomach   . Multiple gastric polyps   . History of colon cancer   . Diverticulosis of colon without hemorrhage   . Family history of prostate cancer   . Family history of colon cancer   . Family history of stomach cancer   . Benign prostatic hyperplasia with urinary obstruction 09/12/2014  . H/O prostate cancer 08/20/2014  . Radiation proctitis 08/19/2014  . H/O agent Orange exposure 07/01/2014  . Unstable angina ([place]) 06/16/2014  . Shingles outbreak, right anterior thoracic 01/28/2014  . Gait abnormality 12/18/2013  . Viral gastroenteritis 11/26/2013  . Dyslipidemia 02/12/2013  . Peripheral  neuropathy 10/14/2012  . CAD (coronary artery disease) 10/14/2012  . HTN (hypertension) 10/14/2012  . Hx of adenomatous colonic polyps 07/26/2012  . Folic acid deficiency 20/94/7096  . Iron deficiency 05/02/2012  . Cancer of ascending colon ([place]) 03/29/2012  . GERD 03/09/2010  . PREMATURE VENTRICULAR CONTRACTIONS 04/30/2009    Past Surgical History:  Procedure Laterality Date  . CARDIAC CATHETERIZATION  02/13/2011   [facility], [facility] cardiology  . CATARACT EXTRACTION     2011  . CATARACT EXTRACTION W/PHACO Right 08/06/2013   Procedure: CATARACT EXTRACTION PHACO AND INTRAOCULAR LENS PLACEMENT  (IOC);  Surgeon: Tonny Branch, MD;  Location: [facility] ORS;  Service: Ophthalmology;  Laterality: Right;  CDE:20.96  . CHOLECYSTECTOMY    . COLONOSCOPY  02/12/2004   Dr. Gala Romney- L side diverticula, inflammatory  colon polyps  . COLONOSCOPY  01/19/2012   RMR: Prominent changes involving the rectal mucosa consistent with radiation-induced proctitis. Multiple colonic  polyps removed as described above. Sigmoid Diverticulosis/ 1.5 x 2 cm relatively flat ulcerated lesion in cecum/path showed adenocarcinoma of the colon. descending colon polyp with tubular adenoma and one fragment of focal high grade dysplasia.   . COLONOSCOPY  08/28/2012   GEZ:MOQHUTMLY proctitis. Colonic diverticulosis/Ulcerations at the surgical anastomosis  path: ulcerated colonic mucosa with prolapse changes, tubular adenoma.   . COLONOSCOPY N/A 08/02/2013   RMR: Colonic polyps -removed as described above. Colonic diverticulosis. Status post reight hemicolectomy. Radiation proctitis- asymptomatic.   Marland Kitchen COLONOSCOPY N/A 05/09/2015   Procedure: COLONOSCOPY;  Surgeon: Daneil Dolin, MD;  Location: [facility];  Service: Endoscopy;  Laterality: N/A;  1200-moved to 1145 [place] to notify pt  . COLONOSCOPY WITH PROPOFOL N/A 11/10/2017   Procedure: COLONOSCOPY WITH PROPOFOL;  Surgeon: Daneil Dolin, MD;  Location: [facility];  Service: Endoscopy;  Laterality: N/A;  . CORONARY ANGIOPLASTY  06/17/14   OM1 POBA  . CORONARY STENT PLACEMENT  01/2011   2.5x63mm Xience drug-eluting stent in ramus intermedius/OMI vessel, 2.5x65mm Promus Element stent in mid LAD artery  . ESOPHAGOGASTRODUODENOSCOPY  10/10/2008   Dr. Tilda Burrow hiatal hernia, normal esophagus, normal stomach  . ESOPHAGOGASTRODUODENOSCOPY  02/12/2004   Dr. Dudley Major- normal   . ESOPHAGOGASTRODUODENOSCOPY N/A 05/09/2015   Procedure: ESOPHAGOGASTRODUODENOSCOPY (EGD);  Surgeon: Daneil Dolin, MD;  [facility];  Service: Endoscopy;  Laterality: N/A;  . ESOPHAGOGASTRODUODENOSCOPY (EGD) WITH  PROPOFOL N/A 11/10/2017   Procedure: ESOPHAGOGASTRODUODENOSCOPY (EGD) WITH PROPOFOL;  Surgeon: Daneil Dolin, MD;  Location: [facility];  Service: Endoscopy;  Laterality: N/A;  9:45am  . GIVENS CAPSULE STUDY N/A 06/03/2015   Procedure: GIVENS CAPSULE STUDY;  Surgeon: Daneil Dolin, MD;  Location: [facility];  Service: Endoscopy;  Laterality: N/A;  0700  . HOT HEMOSTASIS  11/10/2017   Procedure: HOT HEMOSTASIS (ARGON PLASMA COAGULATION/BICAP);  Surgeon: Daneil Dolin, MD;  Location: [facility];  Service: Endoscopy;;  rectum  . KNEE SURGERY     left knee   . LEFT HEART CATHETERIZATION WITH CORONARY ANGIOGRAM N/A 06/17/2014   Procedure: LEFT HEART CATHETERIZATION WITH CORONARY ANGIOGRAM;  Surgeon: Burnell Blanks, MD;  Location: [facility] CATH LAB;  Service: Cardiovascular;  Laterality: N/A;  . PARTIAL COLECTOMY  02/29/2012   Procedure: PARTIAL COLECTOMY;  Surgeon: Adin Hector, MD;  Location: [facility]S;  Service: General;  Laterality: Right;  . POLYPECTOMY  11/10/2017   Procedure: POLYPECTOMY;  Surgeon: Daneil Dolin, MD;  Location[facility];  Service: Endoscopy;;  gastric colon  . PROSTATECTOMY    .  TONSILLECTOMY    . TRANSURETHRAL RESECTION OF PROSTATE    . VASECTOMY         Family History  Problem Relation Age of Onset  . Throat cancer Father        dx in his 46s and again in his 63s; pipe and cigar smoker  . Neuropathy Brother   . Prostate cancer Brother 41  . Melanoma Brother        dx in his 69s  . Cancer Paternal Uncle        smoking related cancer  . Heart attack Paternal Grandfather   . Stomach cancer Cousin 30  . Colon cancer Neg Hx     Social History   Tobacco Use  . Smoking status: Never Smoker  . Smokeless tobacco: Never Used  Vaping Use  . Vaping Use: Never used  Substance Use Topics  . Alcohol use: Yes    Comment: 1 drink every 6 months  . Drug use: No    Home Medications Prior to Admission medications   Medication Sig Start Date End  Date Taking? Authorizing Provider  ALPRAZolam Duanne Moron) 0.5 MG tablet Take 0.5 mg by mouth daily as needed for anxiety. Patient not taking: No sig reported    [provider]  aspirin EC 81 MG tablet Take 81 mg by mouth daily.    [provider]  B Complex Vitamins (VITAMIN B COMPLEX PO) Take 1 tablet by mouth at bedtime.     [provider]  cholecalciferol (VITAMIN D3) 25 MCG (1000 UNIT) tablet Take 1,000 Units by mouth 2 (two) times daily.    [provider]  citalopram (CELEXA) 20 MG tablet Take 10 mg by mouth daily.     [provider]  clopidogrel (PLAVIX) 75 MG tablet TAKE 1 TABLET BY MOUTH ONCE DAILY. 06/09/20   Satira Sark, MD  fluticasone (FLONASE) 50 MCG/ACT nasal spray Place 1 spray into both nostrils at bedtime. 09/08/19   [provider]  folic acid (FOLVITE) 1 MG tablet TAKE (1) TABLET BY MOUTH ONCE DAILY . Patient taking differently: Take 1 mg by mouth daily. 07/13/17   Holley Bouche, NP  ibuprofen (ADVIL,MOTRIN) 200 MG tablet Take 400 mg by mouth daily as needed for headache or moderate pain. Patient not taking: Reported on 10/07/2020    [provider]  latanoprost (XALATAN) 0.005 % ophthalmic solution Place 1 drop into both eyes at bedtime. 01/21/20   Rankin, Clent Demark, MD  loperamide (IMODIUM) 2 MG capsule Take 1 capsule (2 mg total) by mouth as needed for diarrhea or loose stools (maximum 16 mg per day). 11/13/20   Dorie Rank, MD  losartan (COZAAR) 50 MG tablet Take 25 mg by mouth 2 (two) times daily.     [provider]  metoprolol (LOPRESSOR) 50 MG tablet Take 50 mg by mouth 2 (two) times daily.    [provider]  montelukast (SINGULAIR) 10 MG tablet Take 10 mg by mouth at bedtime.    [provider]  Multiple Vitamin (MULTIVITAMIN WITH MINERALS) TABS tablet Take 1 tablet by mouth daily.    [provider]  nitroGLYCERIN (NITROSTAT) 0.4 MG SL tablet PLACE ONE (1) TABLET UNDER  TONGUE EVERY 5 MINUTES UP TO (3) DOSES AS NEEDED FOR CHEST PAIN. 08/20/20   Satira Sark, MD  ondansetron (ZOFRAN) 4 MG tablet Take 1 tablet (4 mg total) by mouth every 6 (six) hours as needed for nausea or vomiting. 11/11/20  Daneil Dolin, MD  OVER THE COUNTER MEDICATION Instaflex one tablet daily Patient not taking: Reported on 10/07/2020    [provider]  pantoprazole (PROTONIX) 40 MG tablet Take 1 tablet (40 mg total) by mouth 2 (two) times daily. 07/01/20   Rourk, Cristopher Estimable, MD  pravastatin (PRAVACHOL) 40 MG tablet TAKE 1 TABLET BY MOUTH AT BEDTIME. 01/07/20   Satira Sark, MD  vitamin B-12 (CYANOCOBALAMIN) 1000 MCG tablet Take by mouth daily.     [provider]    Allergies    Shellfish allergy and Sulfonamide derivatives  Review of Systems   Review of Systems  All other systems reviewed and are negative.   Physical Exam Updated Vital Signs BP (!) 162/85   Pulse 76   Temp 98 F (36.7 C) (Oral)   Resp 18   Ht 1.791 m (5' 10.5")   Wt 93.4 kg   SpO2 100%   BMI 29.14 kg/m   Physical Exam Vitals and nursing note reviewed.  Constitutional:      Appearance: He is well-developed. He is not ill-appearing or diaphoretic.  HENT:     Head: Normocephalic and atraumatic.     Right Ear: External ear normal.     Left Ear: External ear normal.  Eyes:     General: No scleral icterus.       Right eye: No discharge.        Left eye: No discharge.     Conjunctiva/sclera: Conjunctivae normal.  Neck:     Trachea: No tracheal deviation.  Cardiovascular:     Rate and Rhythm: Normal rate and regular rhythm.  Pulmonary:     Effort: Pulmonary effort is normal. No respiratory distress.     Breath sounds: Normal breath sounds. No stridor. No wheezing or rales.  Abdominal:     General: Bowel sounds are normal. There is no distension.     Palpations: Abdomen is soft.     Tenderness: There is no abdominal tenderness. There is no guarding or rebound.   Musculoskeletal:        General: No tenderness.     Cervical back: Neck supple.  Skin:    General: Skin is warm and dry.     Findings: No rash.  Neurological:     Mental Status: He is alert.     Cranial Nerves: No cranial nerve deficit (no facial droop, extraocular movements intact, no slurred speech).     Sensory: No sensory deficit.     Motor: No abnormal muscle tone or seizure activity.     Coordination: Coordination normal.     ED Results / Procedures / Treatments   Labs (all labs ordered are listed, but only abnormal results are displayed) Labs Reviewed  COMPREHENSIVE METABOLIC PANEL - Abnormal; Notable for the following components:      Result Value   Potassium 3.2 (*)    CO2 18 (*)    Calcium 8.4 (*)    Albumin 3.4 (*)    All other components within normal limits  CBC WITH DIFFERENTIAL/PLATELET - Abnormal; Notable for the following components:   WBC 11.6 (*)    Monocytes Absolute 1.2 (*)    Eosinophils Absolute 0.6 (*)    All other components within normal limits  LIPASE, BLOOD    EKG None  Radiology DG Abd Acute W/Chest  Result Date: 11/13/2020 CLINICAL DATA:  Vomiting and diarrhea.  History of colon cancer. EXAM: DG ABDOMEN ACUTE WITH 1 VIEW CHEST COMPARISON:  CT abdomen  pelvis dated July 25, 2020. Chest x-ray dated June 16, 2014. FINDINGS: There is no evidence of dilated bowel loops or free intraperitoneal air. Air-fluid levels in the colon. No radiopaque calculi or other significant radiographic abnormality is seen. Postsurgical changes in the right abdomen. Heart size and mediastinal contours are within normal limits. Bibasilar scarring. Both lungs are otherwise clear. IMPRESSION: 1. No evidence of obstruction. 2. Air-fluid levels in the colon, consistent with diarrheal state. 3. No active cardiopulmonary disease. Electronically Signed   By: Titus Dubin M.D.   On: 11/13/2020 12:02    Procedures Procedures   Medications Ordered in ED Medications   sodium chloride 0.9 % bolus 1,000 mL (0 mLs Intravenous Stopped 11/13/20 1207)    And  0.9 %  sodium chloride infusion (has no administration in time range)  ondansetron (ZOFRAN) injection 4 mg (4 mg Intravenous Given 11/13/20 1110)  loperamide (IMODIUM) capsule 4 mg (4 mg Oral Given 11/13/20 1110)    ED Course  I have reviewed the triage vital signs and the nursing notes.  Pertinent labs & imaging results that were available during my care of the patient were reviewed by me and considered in my medical decision making (see chart for details).  Clinical Course as of 11/13/20 1222  Thu Nov 13, 2020  1153 Electrolyte panel shows decreased bicarbonate.  White blood cell count slightly elevated [JK]  1208 No evidence of obstruction x-rays [JK]  1219 Patient has received a liter of IV fluids.  He states he is feeling better now.  No further episodes of vomiting or diarrhea while he has been here [JK]  1219 He has been able to keep some fluids down orally [JK]    Clinical Course User Index [JK] Dorie Rank, MD   MDM Rules/Calculators/A&P                          Patient presented to the ED for evaluation of vomiting and diarrhea.  Patient does not have any tenderness on exam.  Laboratory tests reassuring.  No signs of severe dehydration.  Suspect viral illness and doubt colitis diverticulitis or obstructive process.  Plan on discharge home with Zofran and Imodium.  Patient already has a Zofran prescription and does not need refill.  Warning signs precautions discussed. Final Clinical Impression(s) / ED Diagnoses Final diagnoses:  Vomiting and diarrhea    Rx / DC Orders ED Discharge Orders         Ordered    loperamide (IMODIUM) 2 MG capsule  As needed        11/13/20 1221           Dorie Rank, MD 11/13/20 1223

## 2020-11-24 ENCOUNTER — Ambulatory Visit (HOSPITAL_COMMUNITY)
Admission: RE | Admit: 2020-11-24 | Discharge: 2020-11-24 | Disposition: A | Discharge status: Home / Self Care | Payer: Medicare PPO | Source: Ambulatory Visit | Attending: Internal Medicine | Admitting: Internal Medicine

## 2020-11-24 DIAGNOSIS — R935 Abnormal findings on diagnostic imaging of other abdominal regions, including retroperitoneum: Secondary | ICD-10-CM | POA: Diagnosis not present

## 2020-11-24 MED ORDER — IOHEXOL 300 MG/ML  SOLN
100.0000 mL | Freq: Once | INTRAMUSCULAR | Status: AC | PRN
  Administered 2020-11-24: 100 mL via INTRAVENOUS

## 2020-12-15 ENCOUNTER — Other Ambulatory Visit (INDEPENDENT_AMBULATORY_CARE_PROVIDER_SITE_OTHER): Payer: Self-pay | Admitting: Ophthalmology

## 2021-01-01 ENCOUNTER — Other Ambulatory Visit: Payer: Self-pay | Admitting: Internal Medicine

## 2021-01-08 ENCOUNTER — Other Ambulatory Visit: Payer: Self-pay | Admitting: Cardiology

## 2021-01-09 ENCOUNTER — Other Ambulatory Visit (INDEPENDENT_AMBULATORY_CARE_PROVIDER_SITE_OTHER): Payer: Self-pay | Admitting: Ophthalmology

## 2021-01-09 ENCOUNTER — Other Ambulatory Visit: Payer: Self-pay | Admitting: Cardiology

## 2021-01-12 ENCOUNTER — Other Ambulatory Visit (HOSPITAL_COMMUNITY): Payer: Self-pay

## 2021-01-12 DIAGNOSIS — C9110 Chronic lymphocytic leukemia of B-cell type not having achieved remission: Secondary | ICD-10-CM

## 2021-01-13 ENCOUNTER — Other Ambulatory Visit: Payer: Self-pay

## 2021-01-13 ENCOUNTER — Ambulatory Visit (HOSPITAL_COMMUNITY): Payer: Medicare PPO | Admitting: Hematology

## 2021-01-13 ENCOUNTER — Inpatient Hospital Stay (HOSPITAL_COMMUNITY): Payer: Medicare PPO | Attending: Hematology

## 2021-01-13 DIAGNOSIS — Z923 Personal history of irradiation: Secondary | ICD-10-CM | POA: Diagnosis not present

## 2021-01-13 DIAGNOSIS — Z85038 Personal history of other malignant neoplasm of large intestine: Secondary | ICD-10-CM | POA: Insufficient documentation

## 2021-01-13 DIAGNOSIS — Z8249 Family history of ischemic heart disease and other diseases of the circulatory system: Secondary | ICD-10-CM | POA: Diagnosis not present

## 2021-01-13 DIAGNOSIS — C9110 Chronic lymphocytic leukemia of B-cell type not having achieved remission: Secondary | ICD-10-CM | POA: Diagnosis present

## 2021-01-13 DIAGNOSIS — Z8719 Personal history of other diseases of the digestive system: Secondary | ICD-10-CM | POA: Diagnosis not present

## 2021-01-13 DIAGNOSIS — Z9221 Personal history of antineoplastic chemotherapy: Secondary | ICD-10-CM | POA: Insufficient documentation

## 2021-01-13 DIAGNOSIS — Z8546 Personal history of malignant neoplasm of prostate: Secondary | ICD-10-CM | POA: Diagnosis not present

## 2021-01-13 LAB — PSA: Prostatic Specific Antigen: 0.07 ng/mL (ref 0.00–4.00)

## 2021-01-13 LAB — CBC WITH DIFFERENTIAL/PLATELET
Abs Immature Granulocytes: 0.07 10*3/uL (ref 0.00–0.07)
Basophils Absolute: 0.1 10*3/uL (ref 0.0–0.1)
Basophils Relative: 1 %
Eosinophils Absolute: 1.1 10*3/uL — ABNORMAL HIGH (ref 0.0–0.5)
Eosinophils Relative: 7 %
HCT: 46.1 % (ref 39.0–52.0)
Hemoglobin: 15 g/dL (ref 13.0–17.0)
Immature Granulocytes: 1 %
Lymphocytes Relative: 37 %
Lymphs Abs: 5.6 10*3/uL — ABNORMAL HIGH (ref 0.7–4.0)
MCH: 31.1 pg (ref 26.0–34.0)
MCHC: 32.5 g/dL (ref 30.0–36.0)
MCV: 95.4 fL (ref 80.0–100.0)
Monocytes Absolute: 1.1 10*3/uL — ABNORMAL HIGH (ref 0.1–1.0)
Monocytes Relative: 7 %
Neutro Abs: 7.1 10*3/uL (ref 1.7–7.7)
Neutrophils Relative %: 47 %
Platelets: 315 10*3/uL (ref 150–400)
RBC: 4.83 MIL/uL (ref 4.22–5.81)
RDW: 12.6 % (ref 11.5–15.5)
WBC: 15 10*3/uL — ABNORMAL HIGH (ref 4.0–10.5)
nRBC: 0 % (ref 0.0–0.2)

## 2021-01-13 LAB — COMPREHENSIVE METABOLIC PANEL
ALT: 17 U/L (ref 0–44)
AST: 21 U/L (ref 15–41)
Albumin: 3.9 g/dL (ref 3.5–5.0)
Alkaline Phosphatase: 50 U/L (ref 38–126)
Anion gap: 7 (ref 5–15)
BUN: 15 mg/dL (ref 8–23)
CO2: 26 mmol/L (ref 22–32)
Calcium: 9.1 mg/dL (ref 8.9–10.3)
Chloride: 107 mmol/L (ref 98–111)
Creatinine, Ser: 1.09 mg/dL (ref 0.61–1.24)
GFR, Estimated: >60 mL/min (ref >60)
Glucose, Bld: 105 mg/dL — ABNORMAL HIGH (ref 70–99)
Potassium: 4.3 mmol/L (ref 3.5–5.1)
Sodium: 140 mmol/L (ref 135–145)
Total Bilirubin: 1.2 mg/dL (ref 0.3–1.2)
Total Protein: 7 g/dL (ref 6.5–8.1)

## 2021-01-13 LAB — VITAMIN D 25 HYDROXY (VIT D DEFICIENCY, FRACTURES): Vit D, 25-Hydroxy: 51.8 ng/mL (ref 30–100)

## 2021-01-13 LAB — FERRITIN: Ferritin: 67 ng/mL (ref 24–336)

## 2021-01-13 LAB — IRON AND TIBC
Iron: 153 ug/dL (ref 45–182)
Saturation Ratios: 47 % — ABNORMAL HIGH (ref 17.9–39.5)
TIBC: 327 ug/dL (ref 250–450)
UIBC: 174 ug/dL

## 2021-01-13 LAB — VITAMIN B12: Vitamin B-12: 580 pg/mL (ref 180–914)

## 2021-01-13 LAB — LACTATE DEHYDROGENASE: LDH: 142 U/L (ref 98–192)

## 2021-01-14 LAB — FOLATE: Folate: 32 ng/mL (ref >5.9)

## 2021-01-14 LAB — CEA: CEA: 1.6 ng/mL (ref 0.0–4.7)

## 2021-01-20 ENCOUNTER — Other Ambulatory Visit: Payer: Self-pay

## 2021-01-20 ENCOUNTER — Ambulatory Visit (INDEPENDENT_AMBULATORY_CARE_PROVIDER_SITE_OTHER): Payer: Medicare PPO | Admitting: Ophthalmology

## 2021-01-20 ENCOUNTER — Encounter (INDEPENDENT_AMBULATORY_CARE_PROVIDER_SITE_OTHER): Payer: Self-pay | Admitting: Ophthalmology

## 2021-01-20 DIAGNOSIS — H353124 Nonexudative age-related macular degeneration, left eye, advanced atrophic with subfoveal involvement: Secondary | ICD-10-CM

## 2021-01-20 DIAGNOSIS — H353111 Nonexudative age-related macular degeneration, right eye, early dry stage: Secondary | ICD-10-CM

## 2021-01-20 DIAGNOSIS — H348122 Central retinal vein occlusion, left eye, stable: Secondary | ICD-10-CM | POA: Diagnosis not present

## 2021-01-20 NOTE — Assessment & Plan Note (Signed)
OS, atrophy, accounts for acuity no change overall

## 2021-01-20 NOTE — Assessment & Plan Note (Signed)
Compensated CRV O with collaterals at the nerve no change

## 2021-01-20 NOTE — Progress Notes (Signed)
David Pugh, [place] [address]   CLINIC:  Medical Oncology/Hematology  PCP:  Leeanne Rio, MD [place] / [place] [address]  [phone]  REASON FOR VISIT:  Follow-up for CLL  PRIOR THERAPY: intermittent feraheme (last infusion 01/03/2015)  CURRENT THERAPY: surveillance  INTERVAL HISTORY:  [name], a 86 y.o. male, returns for routine follow-up for his CLL. David Pugh was last seen on 07/14/2020.  Today he reports feeling good. He reports mild diarrhea. He was reported to ED for vomiting. He denies any night sweats or unintentional weight loss; he has lost some weight but it has been intentional. He reports bleeding from his right ear, but denies bloody or black stools.   REVIEW OF SYSTEMS:  Review of Systems  Constitutional: Positive for fatigue (75%). Negative for appetite change and unexpected weight change.  Respiratory: Positive for cough.   Gastrointestinal: Positive for diarrhea. Negative for blood in stool.  Neurological: Positive for numbness (feet).  All other systems reviewed and are negative.   PAST MEDICAL/SURGICAL HISTORY:  Past Medical History:  Diagnosis Date  . Abnormality of gait 12/18/2013  . Arthritis   . Bilateral foot-drop 04/02/2019  . Biliary dyskinesia   . CAD (coronary artery disease) 2012   a. DES x2 in ramus/OM and mid LAD in 2012  b. balloon angioplaty of OM 2015  . CLL (chronic lymphocytic leukemia) ([place])   . Colon cancer [facility]) 2013   Right hemicolectomy, did not tolerate chemotherapy  . Colon polyps    tubular adenomas  . Diverticula of colon 2010  . Essential hypertension   . Exposure to [employer]   . Folic acid deficiency   . Gastroparesis 2014  . GERD (gastroesophageal reflux disease)   . Glaucoma   . Heart palpitations 2014  . Hiatal hernia 2010  . History of anemia as a child   . History of kidney stones   . Iron deficiency 2013  . Myocardial infarction ([place])  2012  . Peripheral neuropathy   . Prostate cancer [facility]) 2010   Radiation, surgery  . PTSD (post-traumatic stress disorder)   . Radiation proctitis   . Rosacea    Past Surgical History:  Procedure Laterality Date  . CARDIAC CATHETERIZATION  02/13/2011   [facility], [facility] cardiology  . CATARACT EXTRACTION     2011  . CATARACT EXTRACTION W/PHACO Right 08/06/2013   Procedure: CATARACT EXTRACTION PHACO AND INTRAOCULAR LENS PLACEMENT (IOC);  Surgeon: Tonny Branch, MD;  Location: [facility] ORS;  Service: Ophthalmology;  Laterality: Right;  CDE:20.96  . CHOLECYSTECTOMY    . COLONOSCOPY  02/12/2004   Dr. Gala Romney- L side diverticula, inflammatory  colon polyps  . COLONOSCOPY  01/19/2012   RMR: Prominent changes involving the rectal mucosa consistent with radiation-induced proctitis. Multiple colonic  polyps removed as described above. Sigmoid Diverticulosis/ 1.5 x 2 cm relatively flat ulcerated lesion in cecum/path showed adenocarcinoma of the colon. descending colon polyp with tubular adenoma and one fragment of focal high grade dysplasia.   . COLONOSCOPY  08/28/2012   CXK:GYJEHUDJS proctitis. Colonic diverticulosis/Ulcerations at the surgical anastomosis  path: ulcerated colonic mucosa with prolapse changes, tubular adenoma.   . COLONOSCOPY N/A 08/02/2013   RMR: Colonic polyps -removed as described above. Colonic diverticulosis. Status post reight hemicolectomy. Radiation proctitis- asymptomatic.   Marland Kitchen COLONOSCOPY N/A 05/09/2015   Procedure: COLONOSCOPY;  Surgeon: Daneil Dolin, MD;  Location: [facility] ENDO SUITE;  Service: Endoscopy;  Laterality: N/A;  1200-moved to 1145 [place] to notify pt  . COLONOSCOPY WITH PROPOFOL N/A 11/10/2017   Procedure: COLONOSCOPY WITH PROPOFOL;  Surgeon: Daneil Dolin, MD;  Location: [facility] ENDO SUITE;  Service: Endoscopy;  Laterality: N/A;  . CORONARY ANGIOPLASTY  06/17/14   OM1 POBA  . CORONARY STENT PLACEMENT  01/2011   2.5x69mm Xience drug-eluting stent in ramus intermedius/OMI vessel,  2.5x49mm Promus Element stent in mid LAD artery  . ESOPHAGOGASTRODUODENOSCOPY  10/10/2008   Dr. Tilda Burrow hiatal hernia, normal esophagus, normal stomach  . ESOPHAGOGASTRODUODENOSCOPY  02/12/2004   Dr. Dudley Major- normal   . ESOPHAGOGASTRODUODENOSCOPY N/A 05/09/2015   Procedure: ESOPHAGOGASTRODUODENOSCOPY (EGD);  Surgeon: Daneil Dolin, MD;  Location[facility];  Service: Endoscopy;  Laterality: N/A;  . ESOPHAGOGASTRODUODENOSCOPY (EGD) WITH PROPOFOL N/A 11/10/2017   Procedure: ESOPHAGOGASTRODUODENOSCOPY (EGD) WITH PROPOFOL;  Surgeon: Daneil Dolin, MD;  Location: [facility];  Service: Endoscopy;  Laterality: N/A;  9:45am  . GIVENS CAPSULE STUDY N/A 06/03/2015   Procedure: GIVENS CAPSULE STUDY;  Surgeon: Daneil Dolin, MD;  Location: [facility];  Service: Endoscopy;  Laterality: N/A;  0700  . HOT HEMOSTASIS  11/10/2017   Procedure: HOT HEMOSTASIS (ARGON PLASMA COAGULATION/BICAP);  Surgeon: Daneil Dolin, MD;  Location: [facility] END[facility];  Service: Endoscopy;;  rectum  . KNEE SURGERY     left knee   . LEFT HEART CATHETERIZATION WITH CORONARY ANGIOGRAM N/A 06/17/2014   Procedure: LEFT HEART CATHETERIZATION WITH CORONARY ANGIOGRAM;  Surgeon: Burnell Blanks, MD;  Location: [facility] CATH LAB;  Service: Cardiovascular;  Laterality: N/A;  . PARTIAL COLECTOMY  02/29/2012   Procedure: PARTIAL COLECTOMY;  Surgeon: Adin Hector, MD;  Location: [facility] ORS;  Service: General;  Laterality: Right;  . POLYPECTOMY  11/10/2017   Procedure: POLYPECTOMY;  Surgeon: Daneil Dolin, MD;  Location: [facility];  Service: Endoscopy;;  gastric colon  . PROSTATECTOMY    . TONSILLECTOMY    . TRANSURETHRAL RESECTION OF PROSTATE    . VASECTOMY      SOCIAL HISTORY:  Social History   Socioeconomic History  . Marital status: Married    Spouse name: Not on file  . Number of children: 3  . 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 Highest education level: Not on file  Occupational History  . 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 Smoking status: Never Smoker  . Smokeless tobacco: Never Used  Vaping Use  . Vaping Use: Never used  Substance and Sexual Activity  . Alcohol use: Yes    Comment: 1 drink every 6 months  . Drug use: No  . Sexual activity: Not Currently  Other Topics Concern  . Not on file  Social History Narrative   Right handed   Caffeine~1 cup per day    Lives at home with wife    Social Determinants of Health   Financial Resource Strain: Low Risk   . Difficulty of Paying Living Expenses: Not hard at all  Food Insecurity: No Food Insecurity  . Worried About Charity fundraiser in the Last Year: Never true  . Ran Out of Food in the Last Year: Never true  Transportation Needs: No Transportation Needs  . Lack of Transportation (Medical): No  . Lack of Transportation (Non-Medical): No  Physical Activity: Inactive  . Days of Exercise per Week: 0 days  . Minutes of Exercise per Session: 0 min  Stress: No Stress Concern Present  . Feeling of Stress :  Not at all  Social Connections: Moderately Isolated  . Frequency of Communication with Friends and Family: More than three times a week  . Frequency of Social Gatherings with Friends and Family: Three times a week  . Attends Religious Services: Never  . Active Member of Clubs or Organizations: No  . Attends Archivist Meetings: Never  . Marital Status: Married  Human resources officer Violence: Not At Risk  . Fear of Current or Ex-Partner: No  . Emotionally Abused: No  . Physically Abused: No  . Sexually Abused: No    FAMILY HISTORY:  Family History  Problem Relation Age of Onset  . Throat cancer Father        dx in his 66s and again in his 32s; pipe and cigar smoker  . Neuropathy Brother   . Prostate cancer Brother 44  . Melanoma Brother        dx in his 60s  . Cancer Paternal Uncle        smoking related cancer  . Heart attack Paternal Grandfather   . Stomach cancer Cousin 42  . Colon cancer  Neg Hx     CURRENT MEDICATIONS:  Current Outpatient Medications  Medication Sig Dispense Refill  . ALPRAZolam (XANAX) 0.5 MG tablet Take 0.5 mg by mouth daily as needed for anxiety. (Patient not taking: No sig reported)    . aspirin EC 81 MG tablet Take 81 mg by mouth daily.    . B Complex Vitamins (VITAMIN B COMPLEX PO) Take 1 tablet by mouth at bedtime.     . cholecalciferol (VITAMIN D3) 25 MCG (1000 UNIT) tablet Take 1,000 Units by mouth 2 (two) times daily.    . citalopram (CELEXA) 20 MG tablet Take 10 mg by mouth daily.     . clopidogrel (PLAVIX) 75 MG tablet TAKE 1 TABLET BY MOUTH ONCE DAILY. 30 tablet 11  . fluticasone (FLONASE) 50 MCG/ACT nasal spray Place 1 spray into both nostrils at bedtime.    . folic acid (FOLVITE) 1 MG tablet TAKE (1) TABLET BY MOUTH ONCE DAILY . (Patient taking differently: Take 1 mg by mouth daily.) 30 tablet 0  . ibuprofen (ADVIL,MOTRIN) 200 MG tablet Take 400 mg by mouth daily as needed for headache or moderate pain. (Patient not taking: Reported on 10/07/2020)    . latanoprost (XALATAN) 0.005 % ophthalmic solution 1 DROP IN [facility] EYE AT BEDTIME. 2.5 mL 0  . loperamide (IMODIUM) 2 MG capsule Take 1 capsule (2 mg total) by mouth as needed for diarrhea or loose stools (maximum 16 mg per day). 30 capsule 0  . losartan (COZAAR) 50 MG tablet Take 25 mg by mouth 2 (two) times daily.     . metoprolol (LOPRESSOR) 50 MG tablet Take 50 mg by mouth 2 (two) times daily.    . montelukast (SINGULAIR) 10 MG tablet Take 10 mg by mouth at bedtime.    . Multiple Vitamin (MULTIVITAMIN WITH MINERALS) TABS tablet Take 1 tablet by mouth daily.    . nitroGLYCERIN (NITROSTAT) 0.4 MG SL tablet PLACE ONE (1) TABLET UNDER TONGUE EVERY 5 MINUTES UP TO (3) DOSES AS NEEDED FOR CHEST PAIN. 25 tablet 3  . ondansetron (ZOFRAN) 4 MG tablet Take 1 tablet (4 mg total) by mouth every 6 (six) hours as needed for nausea or vomiting. 20 tablet 0  . OVER THE COUNTER MEDICATION Instaflex one tablet  daily (Patient not taking: Reported on 10/07/2020)    . pantoprazole (PROTONIX) 40 MG tablet TAKE (1)  TABLET TWICE DAILY. 60 tablet 5  . pravastatin (PRAVACHOL) 40 MG tablet TAKE 1 TABLET BY MOUTH AT BEDTIME. 30 tablet 1  . vitamin B-12 (CYANOCOBALAMIN) 1000 MCG tablet Take by mouth daily.      No current facility-administered medications for this visit.    ALLERGIES:  Allergies  Allergen Reactions  . Shellfish Allergy Anaphylaxis  . Sulfonamide Derivatives Diarrhea and Nausea Only    PHYSICAL EXAM:  Performance status (ECOG): 1 - Symptomatic but completely ambulatory  There were no vitals filed for this visit. Wt Readings from Last 3 Encounters:  11/13/20 206 lb (93.4 kg)  10/07/20 219 lb 9.6 oz (99.6 kg)  08/20/20 223 lb (101.2 kg)   Physical Exam Vitals reviewed.  Constitutional:      Appearance: Normal appearance.  Cardiovascular:     Rate and Rhythm: Normal rate and regular rhythm.     Pulses: Normal pulses.     Heart sounds: Normal heart sounds.  Pulmonary:     Effort: Pulmonary effort is normal.     Breath sounds: Normal breath sounds.  Chest:  Breasts:     Right: No axillary adenopathy or supraclavicular adenopathy.     Left: No axillary adenopathy or supraclavicular adenopathy.    Abdominal:     Palpations: Abdomen is soft. There is no hepatomegaly, splenomegaly or mass.     Tenderness: There is no abdominal tenderness.  Musculoskeletal:     Right lower leg: No edema.     Left lower leg: No edema.  Lymphadenopathy:     Cervical: No cervical adenopathy.     Right cervical: No superficial cervical adenopathy.    Left cervical: No superficial cervical adenopathy.     Upper Body:     Right upper body: No supraclavicular, axillary or pectoral adenopathy.     Left upper body: No supraclavicular, axillary or pectoral adenopathy.  Neurological:     General: No focal deficit present.     Mental Status: He is alert and oriented to person, place, and time.   Psychiatric:        Mood and Affect: Mood normal.        Behavior: Behavior normal.     LABORATORY DATA:  I have reviewed the labs as listed.  CBC Latest Ref Rng & Units 01/13/2021 11/13/2020 07/09/2020  WBC 4.0 - 10.5 K/uL 15.0(H) 11.6(H) 14.7(H)  Hemoglobin 13.0 - 17.0 g/dL 15.0 15.1 14.2  Hematocrit 39.0 - 52.0 % 46.1 45.0 44.8  Platelets 150 - 400 K/uL 315 341 350   CMP Latest Ref Rng & Units 01/13/2021 11/13/2020 07/09/2020  Glucose 70 - 99 mg/dL 105(H) 92 109(H)  BUN 8 - 23 mg/dL 15 16 19   Creatinine 0.61 - 1.24 mg/dL 1.09 1.10 1.15  Sodium 135 - 145 mmol/L 140 138 137  Potassium 3.5 - 5.1 mmol/L 4.3 3.2(L) 4.2  Chloride 98 - 111 mmol/L 107 106 106  CO2 22 - 32 mmol/L 26 18(L) 24  Calcium 8.9 - 10.3 mg/dL 9.1 8.4(L) 8.6(L)  Total Protein 6.5 - 8.1 g/dL 7.0 6.5 6.3(L)  Total Bilirubin 0.3 - 1.2 mg/dL 1.2 1.2 0.9  Alkaline Phos 38 - 126 U/L 50 52 52  AST 15 - 41 U/L 21 35 20  ALT 0 - 44 U/L 17 32 20      Component Value Date/Time   RBC 4.83 01/13/2021 1000   MCV 95.4 01/13/2021 1000   MCH 31.1 01/13/2021 1000   MCHC 32.5 01/13/2021 1000   RDW 12.6  01/13/2021 1000   LYMPHSABS 5.6 (H) 01/13/2021 1000   MONOABS 1.1 (H) 01/13/2021 1000   EOSABS 1.1 (H) 01/13/2021 1000   BASOSABS 0.1 01/13/2021 1000    DIAGNOSTIC IMAGING:  I have independently reviewed the scans and discussed with the patient. OCT, Retina - OU - Both Eyes  Result Date: 01/20/2021 Right Eye Quality was good. Scan locations included subfoveal. Central Foveal Thickness: 282. Progression has been stable. Left Eye Quality was good. Scan locations included subfoveal. Central Foveal Thickness: 197. Progression has been stable. Findings include abnormal foveal contour, outer retinal atrophy, central retinal atrophy. Notes History of retinal vein occlusion OS, compensated, not active, no CME, RPE atrophy and diffuse foveal atrophy OS    ASSESSMENT:  1.  Stage 0 CLL: - He was diagnosed by flow on  12/13/2014  2.  Stage III ascending colon cancer: - He had 2 out of 20 lymph nodes positive. -Status post right hemicolectomy on 02/29/2012, could not tolerate adjuvant Xeloda. - EGD showed multiple benign gastric polyps, otherwise within normal limits. -He is completed 5 years of surveillance.  No more scans needed at this time. -He had a colonoscopy on 11/10/2017 which showed radiation proctitis changes.  Diverticulosis in the sigmoid colon.  One polyp in the ascending colon which was removed   3.  Prostate cancer: -Underwent radiation therapy at [facility] in 2011. -He also underwent a TURP x2. - He follows up with Dr. Nevada Crane his urologist in [facility].   PLAN:  1.  Stage 0 CLL: - He does not have any B symptoms or recurrent infections. - No palpable adenopathy or splenomegaly.  Reviewed recent CT [facility] from 11/24/2020 which did not show any adenopathy or splenomegaly. - Reviewed labs from 01/13/2021.  White count is stable around 15.  Hemoglobin and platelets are normal.  LDH was normal. - Recommend follow-up in 6 months with repeat labs.  2.  Stage III ascending colon cancer: - Recent CT scan did not show any metastatic disease.  CEA was normal at 1.6.  3.  Prostate cancer: - Latest PSA 0.07.   Orders placed this encounter:  No orders of the defined types were placed in this encounter.    Derek Jack, MD [place] [phone]   I, Thana Ates, am acting as a scribe for Dr. Derek Jack.  I, Derek Jack MD, have reviewed the above documentation for accuracy and completeness, and I agree with the above.

## 2021-01-20 NOTE — Progress Notes (Signed)
01/20/2021     CHIEF COMPLAINT Patient presents for Retina Follow Up (80yr fu OU and OCT/Pt states, "I feel like my NVA continues to get worse but other than that I am not having any issues that I can tell."/Pt reports taking Latanoprost QHS OU)   HISTORY OF PRESENT ILLNESS: David Bazinet. is a 86 y.o. male who presents to the clinic today for:   HPI    Retina Follow Up    Diagnosis: CRVO/BRVO   Laterality: left eye   Onset: 1 year ago   Severity: mild   Duration: 1 year   Course: stable   Comments: 44yr fu OU and OCT Pt states, "I feel like my NVA continues to get worse but other than that I am not having any issues that I can tell." Pt reports taking Latanoprost QHS OU       Last edited by Kendra Opitz, COA on 01/20/2021  9:13 AM. (History)      Referring physician: Charlsie Merles, MD [phone] [address],  [place] [address]  HISTORICAL INFORMATION:   Selected notes from the MEDICAL RECORD NUMBER    Lab Results  Component Value Date   HGBA1C 5.9 (H) 11/25/2013     CURRENT MEDICATIONS: Current Outpatient Medications (Ophthalmic Drugs)  Medication Sig  . latanoprost (XALATAN) 0.005 % ophthalmic solution 1 DROP IN [facility] EYE AT BEDTIME.   No current facility-administered medications for this visit. (Ophthalmic Drugs)   Current Outpatient Medications (Other)  Medication Sig  . ALPRAZolam (XANAX) 0.5 MG tablet Take 0.5 mg by mouth daily as needed for anxiety. (Patient not taking: No sig reported)  . aspirin EC 81 MG tablet Take 81 mg by mouth daily.  . B Complex Vitamins (VITAMIN B COMPLEX PO) Take 1 tablet by mouth at bedtime.   . cholecalciferol (VITAMIN D3) 25 MCG (1000 UNIT) tablet Take 1,000 Units by mouth 2 (two) times daily.  . citalopram (CELEXA) 20 MG tablet Take 10 mg by mouth daily.   . clopidogrel (PLAVIX) 75 MG tablet TAKE 1 TABLET BY MOUTH ONCE DAILY.  . fluticasone (FLONASE) 50 MCG/ACT nasal spray Place 1 spray into both  nostrils at bedtime.  . folic acid (FOLVITE) 1 MG tablet TAKE (1) TABLET BY MOUTH ONCE DAILY . (Patient taking differently: Take 1 mg by mouth daily.)  . ibuprofen (ADVIL,MOTRIN) 200 MG tablet Take 400 mg by mouth daily as needed for headache or moderate pain. (Patient not taking: Reported on 10/07/2020)  . loperamide (IMODIUM) 2 MG capsule Take 1 capsule (2 mg total) by mouth as needed for diarrhea or loose stools (maximum 16 mg per day).  Marland Kitchen losartan (COZAAR) 50 MG tablet Take 25 mg by mouth 2 (two) times daily.   . metoprolol (LOPRESSOR) 50 MG tablet Take 50 mg by mouth 2 (two) times daily.  . montelukast (SINGULAIR) 10 MG tablet Take 10 mg by mouth at bedtime.  . Multiple Vitamin (MULTIVITAMIN WITH MINERALS) TABS tablet Take 1 tablet by mouth daily.  . nitroGLYCERIN (NITROSTAT) 0.4 MG SL tablet PLACE ONE (1) TABLET UNDER TONGUE EVERY 5 MINUTES UP TO (3) DOSES AS NEEDED FOR CHEST PAIN.  Marland Kitchen ondansetron (ZOFRAN) 4 MG tablet Take 1 tablet (4 mg total) by mouth every 6 (six) hours as needed for nausea or vomiting.  Marland Kitchen OVER THE COUNTER MEDICATION Instaflex one tablet daily (Patient not taking: Reported on 10/07/2020)  . pantoprazole (PROTONIX) 40 MG tablet TAKE (1) TABLET TWICE DAILY.  Marland Kitchen  pravastatin (PRAVACHOL) 40 MG tablet TAKE 1 TABLET BY MOUTH AT BEDTIME.  . vitamin B-12 (CYANOCOBALAMIN) 1000 MCG tablet Take by mouth daily.    No current facility-administered medications for this visit. (Other)      REVIEW OF SYSTEMS:    ALLERGIES Allergies  Allergen Reactions  . Shellfish Allergy Anaphylaxis  . Sulfonamide Derivatives Diarrhea and Nausea Only    PAST MEDICAL HISTORY Past Medical History:  Diagnosis Date  . Abnormality of gait 12/18/2013  . Arthritis   . Bilateral foot-drop 04/02/2019  . Biliary dyskinesia   . CAD (coronary artery disease) 2012   a. DES x2 in ramus/OM and mid LAD in 2012  b. balloon angioplaty of OM 2015  . CLL (chronic lymphocytic leukemia) ([place])   . Colon cancer  [facility]) 2013   Right hemicolectomy, did not tolerate chemotherapy  . Colon polyps    tubular adenomas  . Diverticula of colon 2010  . Essential hypertension   . Exposure to [employer]   . Folic acid deficiency   . Gastroparesis 2014  . GERD (gastroesophageal reflux disease)   . Glaucoma   . Heart palpitations 2014  . Hiatal hernia 2010  . History of anemia as a child   . History of kidney stones   . Iron deficiency 2013  . Myocardial infarction ([place]) 2012  . Peripheral neuropathy   . Prostate cancer [facility]) 2010   Radiation, surgery  . PTSD (post-traumatic stress disorder)   . Radiation proctitis   . Rosacea    Past Surgical History:  Procedure Laterality Date  . CARDIAC CATHETERIZATION  02/13/2011   [facility] cardiology  . CATARACT EXTRACTION     2011  . CATARACT EXTRACTION W/PHACO Right 08/06/2013   Procedure: CATARACT EXTRACTION PHACO AND INTRAOCULAR LENS PLACEMENT (IOC);  Surgeon: Tonny Branch, MD;  Location: [facility] ORS;  Service: Ophthalmology;  Laterality: Right;  CDE:20.96  . CHOLECYSTECTOMY    . COLONOSCOPY  02/12/2004   Dr. Gala Romney- L side diverticula, inflammatory  colon polyps  . COLONOSCOPY  01/19/2012   RMR: Prominent changes involving the rectal mucosa consistent with radiation-induced proctitis. Multiple colonic  polyps removed as described above. Sigmoid Diverticulosis/ 1.5 x 2 cm relatively flat ulcerated lesion in cecum/path showed adenocarcinoma of the colon. descending colon polyp with tubular adenoma and one fragment of focal high grade dysplasia.   . COLONOSCOPY  08/28/2012   YTK:ZSWFUXNAT proctitis. Colonic diverticulosis/Ulcerations at the surgical anastomosis  path: ulcerated colonic mucosa with prolapse changes, tubular adenoma.   . COLONOSCOPY N/A 08/02/2013   RMR: Colonic polyps -removed as described above. Colonic diverticulosis. Status post reight hemicolectomy. Radiation proctitis- asymptomatic.   Marland Kitchen COLONOSCOPY N/A 05/09/2015   Procedure: COLONOSCOPY;   Surgeon: Daneil Dolin, MD;  Location: [facility] ENDO SUITE;  Service: Endoscopy;  Laterality: N/A;  1200-moved to [address]5 [place] to notify pt  . COLONOSCOPY WITH PROPOFOL N/A 11/10/2017   Procedure: COLONOSCOPY WITH PROPOFOL;  Surgeon: Daneil Dolin, MD;  Location: [facility] END[facility] SU[facility];  Service: Endoscopy;  Laterality: N/A;  . CORONARY ANGIOPLASTY  06/17/14   OM1 POBA  . CORONARY STENT PLACEMENT  01/2011   2.5x2mm Xience drug-eluting stent in ramus intermedius/OMI vessel, 2.5x23mm Promus Element stent in mid LAD artery  . ESOPHAGOGASTRODUODENOSCOPY  10/10/2008   Dr. Tilda Burrow hiatal hernia, normal esophagus, normal stomach  . ESOPHAGOGASTRODUODENOSCOPY  02/12/2004   Dr. Dudley Major- normal   . ESOPHAGOGASTRODUODENOSCOPY N/A 05/09/2015   Procedure: ESOPHAGOGASTRODUODENOSCOPY (EGD);  Surgeon: Daneil Dolin, MD;  Location[facility];  Service: Endoscopy;  Laterality: N/A;  . ESOPHAGOGASTRODUODENOSCOPY (EGD) WITH PROPOFOL N/A 11/10/2017   Procedure: ESOPHAGOGASTRODUODENOSCOPY (EGD) WITH PROPOFOL;  Surgeon: Daneil Dolin, MD;  Location: [facility];  Service: Endoscopy;  Laterality: N/A;  9:45am  . GIVENS CAPSULE STUDY N/A 06/03/2015   Procedure: GIVENS CAPSULE STUDY;  Surgeon: Daneil Dolin, MD;  Location[facility];  Service: Endoscopy;  Laterality: N/A;  0700  . HOT HEMOSTASIS  11/10/2017   Procedure: HOT HEMOSTASIS (ARGON PLASMA COAGULATION/BICAP);  Surgeon: Daneil Dolin, MD;  Location: [facility];  Service: Endoscopy;;  rectum  . KNEE SURGERY     left knee   . LEFT HEART CATHETERIZATION WITH CORONARY ANGIOGRAM N/A 06/17/2014   Procedure: LEFT HEART CATHETERIZATION WITH CORONARY ANGIOGRAM;  Surgeon: Burnell Blanks, MD;  Location: [facility] CATH LAB;  Service: Cardiovascular;  Laterality: N/A;  . PARTIAL COLECTOMY  02/29/2012   Procedure: PARTIAL COLECTOMY;  Surgeon: Adin Hector, MD;  Location: [facility] ORS;  Service: General;  Laterality: Right;  . POLYPECTOMY  11/10/2017   Procedure: POLYPECTOMY;   Surgeon: Daneil Dolin, MD;  Location: [facility];  Service: Endoscopy;;  gastric colon  . PROSTATECTOMY    . TONSILLECTOMY    . TRANSURETHRAL RESECTION OF PROSTATE    . VASECTOMY      FAMILY HISTORY Family History  Problem Relation Age of Onset  . Throat cancer Father        dx in his 72s and again in his 27s; pipe and cigar smoker  . Neuropathy Brother   . Prostate cancer Brother 65  . Melanoma Brother        dx in his 12s  . Cancer Paternal Uncle        smoking related cancer  . Heart attack Paternal Grandfather   . Stomach cancer Cousin 58  . Colon cancer Neg Hx     SOCIAL HISTORY Social History   Tobacco Use  . Smoking status: Never Smoker  . Smokeless tobacco: Never Used  Vaping Use  . Vaping Use: Never used  Substance Use Topics  . Alcohol use: Yes    Comment: 1 drink every 6 months  . Drug use: No         OPHTHALMIC EXAM: Base Eye Exam    Visual Acuity (ETDRS)      Right Left   Dist Harris 20/25 -1 CF at 5'       Tonometry (Tonopen, 9:16 AM)      Right Left   Pressure 15 14       Pupils      Pupils Dark Light Shape React APD   Right PERRL 3 2 Round Slow None   Left PERRL 3 2 Round Slow None       Visual Fields (Counting fingers)      Left Right    Full Full       Extraocular Movement      Right Left    Full Full       Neuro/Psych    Mood/Affect: Normal       Dilation    Both eyes: 1.0% Mydriacyl, 2.5% Phenylephrine @ 9:16 AM        Slit Lamp and Fundus Exam    External Exam      Right Left   External Normal Normal       Slit Lamp Exam      Right Left   Lids/Lashes Normal Normal   Conjunctiva/Sclera  White and quiet White and quiet   Cornea Clear Clear   Anterior Chamber Deep and quiet Deep and quiet   Iris Round and reactive Round and reactive   Lens Posterior chamber intraocular lens Posterior chamber intraocular lens   Anterior Vitreous Normal Normal       Fundus Exam      Right Left   Posterior Vitreous  Posterior vitreous detachment Posterior vitreous detachment   Disc Normal Normal   C/D Ratio 0.45 0.45   Macula Normal Atrophy, Geographic atrophy   Vessels Normal Old retinal vein occlusion, compensated stable   Periphery Normal Good moderate scatter PRP pattern retina attached          IMAGING AND PROCEDURES  Imaging and Procedures for 01/20/21  OCT, Retina - OU - Both Eyes       Right Eye Quality was good. Scan locations included subfoveal. Central Foveal Thickness: 282. Progression has been stable.   Left Eye Quality was good. Scan locations included subfoveal. Central Foveal Thickness: 197. Progression has been stable. Findings include abnormal foveal contour, outer retinal atrophy, central retinal atrophy.   Notes History of retinal vein occlusion OS, compensated, not active, no CME, RPE atrophy and diffuse foveal atrophy OS                ASSESSMENT/PLAN:  Advanced nonexudative age-related macular degeneration of left eye with subfoveal involvement OS, atrophy, accounts for acuity no change overall  Central retinal vein occlusion of left eye Compensated CRV O with collaterals at the nerve no change  Early stage nonexudative age-related macular degeneration of right eye Minor OD no high risk features observe      ICD-10-CM   1. Central retinal vein occlusion of left eye, unspecified complication status  X52.8413 OCT, Retina - OU - Both Eyes  2. Advanced nonexudative age-related macular degeneration of left eye with subfoveal involvement  H35.3124   3. Early stage nonexudative age-related macular degeneration of right eye  H35.3111     1.  No interval change in findings  2.  Acuity stable in each eye  3.  Ophthalmic Meds Ordered this visit:  No orders of the defined types were placed in this encounter.      Return in about 1 year (around 01/20/2022) for DILATE OU, OCT, COLOR FP.  There are no Patient Instructions on file for this  visit.   Explained the diagnoses, plan, and follow up with the patient and they expressed understanding.  Patient expressed understanding of the importance of proper follow up care.   Clent Demark Bonny Egger M.D. Diseases & Surgery of the Retina and Vitreous [facility] [place] 01/20/21     Abbreviations: M myopia (nearsighted); A astigmatism; H hyperopia (farsighted); P presbyopia; Mrx spectacle prescription;  CTL contact lenses; OD right eye; OS left eye; OU both eyes  XT exotropia; ET esotropia; PEK punctate epithelial keratitis; PEE punctate epithelial erosions; DES dry eye syndrome; MGD meibomian gland dysfunction; ATs artificial tears; PFAT's preservative free artificial tears; [place] nuclear sclerotic cataract; PSC posterior subcapsular cataract; ERM epi-retinal membrane; PVD posterior vitreous detachment; RD retinal detachment; DM diabetes mellitus; DR diabetic retinopathy; NPDR non-proliferative diabetic retinopathy; PDR proliferative diabetic retinopathy; CSME clinically significant macular edema; DME diabetic macular edema; dbh dot blot hemorrhages; CWS cotton wool spot; POAG primary open angle glaucoma; C/D cup-to-disc ratio; HVF humphrey visual field; GVF goldmann visual field; OCT optical coherence tomography; IOP intraocular pressure; BRVO Branch retinal vein occlusion; CRVO central retinal vein occlusion; CRAO  central retinal artery occlusion; BRAO branch retinal artery occlusion; RT retinal tear; SB scleral buckle; PPV pars plana vitrectomy; VH Vitreous hemorrhage; PRP panretinal laser photocoagulation; IVK intravitreal kenalog; VMT vitreomacular traction; MH Macular hole;  NVD neovascularization of the disc; NVE neovascularization elsewhere; AREDS age related eye disease study; ARMD age related macular degeneration; POAG primary open angle glaucoma; EBMD epithelial/anterior basement membrane dystrophy; ACIOL anterior chamber intraocular lens; IOL intraocular lens; PCIOL posterior chamber  intraocular lens; Phaco/IOL phacoemulsification with intraocular lens placement; [place] photorefractive keratectomy; LASIK laser assisted in situ keratomileusis; HTN hypertension; DM diabetes mellitus; COPD chronic obstructive pulmonary disease

## 2021-01-20 NOTE — Assessment & Plan Note (Signed)
Minor OD no high risk features observe

## 2021-01-21 ENCOUNTER — Inpatient Hospital Stay (HOSPITAL_BASED_OUTPATIENT_CLINIC_OR_DEPARTMENT_OTHER): Payer: Medicare PPO | Admitting: Hematology

## 2021-01-21 VITALS — BP 141/67 | HR 63 | Temp 96.8°F | Resp 20 | Wt 213.1 lb

## 2021-01-21 DIAGNOSIS — C9110 Chronic lymphocytic leukemia of B-cell type not having achieved remission: Secondary | ICD-10-CM

## 2021-01-21 NOTE — Patient Instructions (Signed)
[facility] at [facility] Discharge Instructions  You were seen today by Dr. Katragadda. He went over your recent results. Dr. Katragadda will see you back in 6 months for labs and follow up.   Thank you for choosing [facility] at [facility] to provide your oncology and hematology care.  To afford each patient quality time with our provider, please arrive at least 15 minutes before your scheduled appointment time.   If you have a lab appointment with the Cancer Center please come in thru the Main Entrance and check in at the main information desk  You need to re-schedule your appointment should you arrive 10 or more minutes late.  We strive to give you quality time with our providers, and arriving late affects you and other patients whose appointments are after yours.  Also, if you no show three or more times for appointments you may be dismissed from the clinic at the providers discretion.     Again, thank you for choosing [facility].  Our hope is that these requests will decrease the amount of time that you wait before being seen by our physicians.       _____________________________________________________________  Should you have questions after your visit to [facility], please contact our office at [phone] between the hours of 8:00 a.m. and 4:30 p.m.  Voicemails left after 4:00 p.m. will not be returned until the following business day.  For prescription refill requests, have your pharmacy contact our office and allow 72 hours.    Cancer Center Support Programs:   > Cancer Support Group  2nd Tuesday of the month 1pm-2pm, Journey Room    

## 2021-02-10 ENCOUNTER — Other Ambulatory Visit (INDEPENDENT_AMBULATORY_CARE_PROVIDER_SITE_OTHER): Payer: Self-pay | Admitting: Ophthalmology

## 2021-02-10 ENCOUNTER — Encounter: Payer: Self-pay | Admitting: Emergency Medicine

## 2021-02-10 ENCOUNTER — Ambulatory Visit
Admission: EM | Admit: 2021-02-10 | Discharge: 2021-02-10 | Disposition: A | Discharge status: Home / Self Care | Payer: Medicare PPO | Attending: Family Medicine | Admitting: Family Medicine

## 2021-02-10 DIAGNOSIS — R059 Cough, unspecified: Secondary | ICD-10-CM

## 2021-02-10 MED ORDER — BENZONATATE 100 MG PO CAPS: ORAL_CAPSULE | ORAL | 0 refills | Status: DC

## 2021-02-10 NOTE — ED Triage Notes (Signed)
Cough and nasal congestion since Sunday

## 2021-02-10 NOTE — Discharge Instructions (Addendum)
You have been tested for COVID-19 today. °If your test returns positive, you will receive a phone call from [place] regarding your results. °Negative test results are not called. °Both positive and negative results area always visible on MyChart. °If you do not have a MyChart account, sign up instructions are provided in your discharge papers. °Please do not hesitate to contact us should you have questions or concerns. ° °

## 2021-02-10 NOTE — ED Provider Notes (Signed)
[name]   [id number] 02/10/21 Arrival Time: 7673  ASSESSMENT & PLAN:  1. Cough    Lungs CTAB. COVID-19 testing sent. Discussed typical duration of viral URI. OTC symptom care as needed.  As needed for cough: Meds ordered this encounter  Medications   benzonatate (TESSALON) 100 MG capsule    Sig: Take 1 capsule by mouth every 8 (eight) hours for cough.    Dispense:  21 capsule    Refill:  0     Follow-up Information     Leeanne Rio, MD.   Specialty: Family Medicine Why: As needed. Contact information: [address] [place] [address] [phone]                 Reviewed expectations re: course of current medical issues. Questions answered. Outlined signs and symptoms indicating need for more acute intervention. Understanding verbalized. After Visit Summary given.   SUBJECTIVE: History from: patient. David Pugh. is a 86 y.o. male who reports a cough; occasional productive of mucous; abrupt onset pparox 2-3 d ago. No assoc SOB/CP. Denies: fever and sore throat. Normal PO intake without n/v/d.    OBJECTIVE:  Vitals:   02/10/21 1357  BP: 121/70  Pulse: (!) 57  Resp: 17  Temp: 98.2 F (36.8 C)  TempSrc: Tympanic  SpO2: 96%    General appearance: alert; no distress Eyes: PERRLA; EOMI; conjunctiva normal HENT: [place]; AT; with mild nasal congestion Neck: supple  Lungs: speaks full sentences without difficulty; unlabored Extremities: no edema Skin: warm and dry Neurologic: normal gait Psychological: alert and cooperative; normal mood and affect  Labs:  Labs Reviewed  NOVEL CORONAVIRUS, NAA    Allergies  Allergen Reactions   Shellfish Allergy Anaphylaxis   Sulfonamide Derivatives Diarrhea and Nausea Only    Past Medical History:  Diagnosis Date   Abnormality of gait 12/18/2013   Arthritis    Bilateral foot-drop 04/02/2019   Biliary dyskinesia    CAD (coronary artery disease) 2012   a. DES x2 in ramus/OM  and mid LAD in 2012  b. balloon angioplaty of OM 2015   CLL (chronic lymphocytic leukemia) ([place])    Colon cancer ([place]) 2013   Right hemicolectomy, did not tolerate chemotherapy   Colon polyps    tubular adenomas   Diverticula of colon 2010   Essential hypertension    Exposure to Agent Orange    Folic acid deficiency    Gastroparesis 2014   GERD (gastroesophageal reflux disease)    Glaucoma    Heart palpitations 2014   Hiatal hernia 2010   History of anemia as a child    History of kidney stones    Iron deficiency 2013   Myocardial infarction [facility]) 2012   Peripheral neuropathy    Prostate cancer ([place]) 2010   Radiation, surgery   PTSD (post-traumatic stress disorder)    Radiation proctitis    Rosacea    Social History   Socioeconomic History   Marital status: Married    Spouse name: Not on file   Number of children: 3   Years of education: military   Highest education level: Not on file  Occupational History   Occupation: Retired    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  Tobacco Use   Smoking status: Never   Smokeless tobacco: Never  Vaping Use   Vaping Use: Never used  Substance and Sexual Activity   Alcohol use: Yes    Comment: 1 drink every 6 months   Drug use:  No   Sexual activity: Not Currently  Other Topics Concern   Not on file  Social History Narrative   Right handed   Caffeine~1 cup per day    Lives at home with wife    Social Determinants of Health   Financial Resource Strain: Low Risk    Difficulty of Paying Living Expenses: Not hard at all  Food Insecurity: No Food Insecurity   Worried About Charity fundraiser in the Last Year: Never true   Arboriculturist in the Last Year: Never true  Transportation Needs: No Transportation Needs   Lack of Transportation (Medical): No   Lack of Transportation (Non-Medical): No  Physical Activity: Inactive   Days of Exercise per Week: 0 days   Minutes of Exercise per Session: 0 min  Stress: No Stress Concern Present    Feeling of Stress : Not at all  Social Connections: Moderately Isolated   Frequency of Communication with Friends and Family: More than three times a week   Frequency of Social Gatherings with Friends and Family: Three times a week   Attends Religious Services: Never   Active Member of Clubs or Organizations: No   Attends Archivist Meetings: Never   Marital Status: Married  Human resources officer Violence: Not At Risk   Fear of Current or Ex-Partner: No   Emotionally Abused: No   Physically Abused: No   Sexually Abused: No   Family History  Problem Relation Age of Onset   Throat cancer Father        dx in his 77s and again in his 40s; pipe and cigar smoker   Neuropathy Brother    Prostate cancer Brother 86   Melanoma Brother        dx in his 28s   Cancer Paternal Uncle        smoking related cancer   Heart attack Paternal Grandfather    Stomach cancer Cousin 71   Colon cancer Neg Hx    Past Surgical History:  Procedure Laterality Date   CARDIAC CATHETERIZATION  02/13/2011   [facility], [place] cardiology   CATARACT EXTRACTION     2011   CATARACT EXTRACTION W/PHACO Right 08/06/2013   Procedure: CATARACT EXTRACTION PHACO AND INTRAOCULAR LENS PLACEMENT ([place]);  Surgeon: Tonny Branch, MD;  Location: [facility];  Service: Ophthalmology;  Laterality: Right;  CDE:20.96   CHOLECYSTECTOMY     COLONOSCOPY  02/12/2004   Dr. Gala Romney- L side diverticula, inflammatory  colon polyps   COLONOSCOPY  01/19/2012   RMR: Prominent changes involving the rectal mucosa consistent with radiation-induced proctitis. Multiple colonic  polyps removed as described above. Sigmoid Diverticulosis/ 1.5 x 2 cm relatively flat ulcerated lesion in cecum/path showed adenocarcinoma of the colon. descending colon polyp with tubular adenoma and one fragment of focal high grade dysplasia.    COLONOSCOPY  08/28/2012   ERD:EYCXKGYJE proctitis. Colonic diverticulosis/Ulcerations at the surgical anastomosis  path: ulcerated  colonic mucosa with prolapse changes, tubular adenoma.    COLONOSCOPY N/A 08/02/2013   RMR: Colonic polyps -removed as described above. Colonic diverticulosis. Status post reight hemicolectomy. Radiation proctitis- asymptomatic.    COLONOSCOPY N/A 05/09/2015   Procedure: COLONOSCOPY;  Surgeon: Daneil Dolin, MD;  Location: [facility];  Service: Endoscopy;  Laterality: N/A;  1200-moved to [address]5 [address] to notify pt   COLONOSCOPY WITH PROPOFOL N/A 11/10/2017   Procedure: COLONOSCOPY WITH PROPOFOL;  Surgeon: Daneil Dolin, MD;  Location: [facility];  Service: Endoscopy;  Laterality: N/A;  CORONARY ANGIOPLASTY  06/17/14   OM1 POBA   CORONARY STENT PLACEMENT  01/2011   2.5x16mm Xience drug-eluting stent in ramus intermedius/OMI vessel, 2.5x49mm Promus Element stent in mid LAD artery   ESOPHAGOGASTRODUODENOSCOPY  10/10/2008   Dr. Tilda Burrow hiatal hernia, normal esophagus, normal stomach   ESOPHAGOGASTRODUODENOSCOPY  02/12/2004   Dr. Dudley Major- normal    ESOPHAGOGASTRODUODENOSCOPY N/A 05/09/2015   Procedure: ESOPHAGOGASTRODUODENOSCOPY (EGD);  Surgeon: Daneil Dolin, MD;  [facility];  Service: Endoscopy;  Laterality: N/A;   ESOPHAGOGASTRODUODENOSCOPY (EGD) WITH PROPOFOL N/A 11/10/2017   Procedure: ESOPHAGOGASTRODUODENOSCOPY (EGD) WITH PROPOFOL;  Surgeon: Daneil Dolin, MD;  [facility];  Service: Endoscopy;  Laterality: N/A;  9:45am   GIVENS CAPSULE STUDY N/A 06/03/2015   Procedure: GIVENS CAPSULE STUDY;  Surgeon: Daneil Dolin, MD;  [facility];  Service: Endoscopy;  Laterality: N/A;  0700   HOT HEMOSTASIS  11/10/2017   Procedure: HOT HEMOSTASIS (ARGON PLASMA COAGULATION/BICAP);  Surgeon: Daneil Dolin, MD;  [facility];  Service: Endoscopy;;  rectum   KNEE SURGERY     left knee    LEFT HEART CATHETERIZATION WITH CORONARY ANGIOGRAM N/A 06/17/2014   Procedure: LEFT HEART CATHETERIZATION WITH CORONARY ANGIOGRAM;  Surgeon: Burnell Blanks, MD;   Location: [facility] C[facility] LAB;  Service: Cardiovascular;  Laterality: N/A;   PARTIAL COLECTOMY  02/29/2012   Procedure: PARTIAL COLECTOMY;  Surgeon: Adin Hector, MD;  Location: [facility];  Service: General;  Laterality: Right;   POLYPECTOMY  11/10/2017   Procedure: POLYPECTOMY;  Surgeon: Daneil Dolin, MD;  [facility];  Service: Endoscopy;;  gastric colon   PROSTATECTOMY     TONSILLECTOMY     TRANSURETHRAL RESECTION OF PROSTATE     Lady Deutscher, MD 02/10/21 1604

## 2021-02-11 LAB — SARS-COV-2, NAA 2 DAY TAT

## 2021-02-11 LAB — NOVEL CORONAVIRUS, NAA: SARS-CoV-2, NAA: NOT DETECTED

## 2021-03-05 ENCOUNTER — Other Ambulatory Visit: Payer: Self-pay | Admitting: Cardiology

## 2021-03-06 ENCOUNTER — Other Ambulatory Visit: Payer: Self-pay | Admitting: Cardiology

## 2021-03-10 ENCOUNTER — Other Ambulatory Visit (INDEPENDENT_AMBULATORY_CARE_PROVIDER_SITE_OTHER): Payer: Self-pay | Admitting: Ophthalmology

## 2021-04-03 ENCOUNTER — Other Ambulatory Visit (INDEPENDENT_AMBULATORY_CARE_PROVIDER_SITE_OTHER): Payer: Self-pay | Admitting: Ophthalmology

## 2021-04-28 ENCOUNTER — Other Ambulatory Visit (INDEPENDENT_AMBULATORY_CARE_PROVIDER_SITE_OTHER): Payer: Self-pay | Admitting: Ophthalmology

## 2021-05-01 ENCOUNTER — Emergency Department (HOSPITAL_COMMUNITY): Payer: Medicare PPO

## 2021-05-01 ENCOUNTER — Emergency Department (HOSPITAL_COMMUNITY)
Admission: EM | Admit: 2021-05-01 | Discharge: 2021-05-01 | Disposition: A | Discharge status: Home / Self Care | Payer: Medicare PPO | Attending: Emergency Medicine | Admitting: Emergency Medicine

## 2021-05-01 ENCOUNTER — Encounter (HOSPITAL_COMMUNITY): Payer: Self-pay

## 2021-05-01 ENCOUNTER — Other Ambulatory Visit: Payer: Self-pay

## 2021-05-01 DIAGNOSIS — R531 Weakness: Secondary | ICD-10-CM | POA: Diagnosis present

## 2021-05-01 DIAGNOSIS — I10 Essential (primary) hypertension: Secondary | ICD-10-CM | POA: Diagnosis not present

## 2021-05-01 DIAGNOSIS — Z8546 Personal history of malignant neoplasm of prostate: Secondary | ICD-10-CM | POA: Diagnosis not present

## 2021-05-01 DIAGNOSIS — Z20822 Contact with and (suspected) exposure to covid-19: Secondary | ICD-10-CM | POA: Insufficient documentation

## 2021-05-01 DIAGNOSIS — Z7982 Long term (current) use of aspirin: Secondary | ICD-10-CM | POA: Diagnosis not present

## 2021-05-01 DIAGNOSIS — Z79899 Other long term (current) drug therapy: Secondary | ICD-10-CM | POA: Insufficient documentation

## 2021-05-01 DIAGNOSIS — Z85038 Personal history of other malignant neoplasm of large intestine: Secondary | ICD-10-CM | POA: Diagnosis not present

## 2021-05-01 DIAGNOSIS — Z7902 Long term (current) use of antithrombotics/antiplatelets: Secondary | ICD-10-CM | POA: Insufficient documentation

## 2021-05-01 DIAGNOSIS — E860 Dehydration: Secondary | ICD-10-CM | POA: Diagnosis not present

## 2021-05-01 DIAGNOSIS — K469 Unspecified abdominal hernia without obstruction or gangrene: Secondary | ICD-10-CM | POA: Insufficient documentation

## 2021-05-01 DIAGNOSIS — I25110 Atherosclerotic heart disease of native coronary artery with unstable angina pectoris: Secondary | ICD-10-CM | POA: Diagnosis not present

## 2021-05-01 LAB — CBC WITH DIFFERENTIAL/PLATELET
Abs Immature Granulocytes: 0.07 10*3/uL (ref 0.00–0.07)
Basophils Absolute: 0.1 10*3/uL (ref 0.0–0.1)
Basophils Relative: 0 %
Eosinophils Absolute: 0.7 10*3/uL — ABNORMAL HIGH (ref 0.0–0.5)
Eosinophils Relative: 5 %
HCT: 43 % (ref 39.0–52.0)
Hemoglobin: 14.2 g/dL (ref 13.0–17.0)
Immature Granulocytes: 1 %
Lymphocytes Relative: 48 %
Lymphs Abs: 6.7 10*3/uL — ABNORMAL HIGH (ref 0.7–4.0)
MCH: 31.5 pg (ref 26.0–34.0)
MCHC: 33 g/dL (ref 30.0–36.0)
MCV: 95.3 fL (ref 80.0–100.0)
Monocytes Absolute: 1 10*3/uL (ref 0.1–1.0)
Monocytes Relative: 7 %
Neutro Abs: 5.5 10*3/uL (ref 1.7–7.7)
Neutrophils Relative %: 39 %
Platelets: 326 10*3/uL (ref 150–400)
RBC: 4.51 MIL/uL (ref 4.22–5.81)
RDW: 12.7 % (ref 11.5–15.5)
WBC: 14 10*3/uL — ABNORMAL HIGH (ref 4.0–10.5)
nRBC: 0 % (ref 0.0–0.2)

## 2021-05-01 LAB — URINALYSIS, ROUTINE W REFLEX MICROSCOPIC
Bilirubin Urine: NEGATIVE
Glucose, UA: NEGATIVE mg/dL
Ketones, ur: NEGATIVE mg/dL
Nitrite: NEGATIVE
Protein, ur: NEGATIVE mg/dL
Specific Gravity, Urine: 1.02 (ref 1.005–1.030)
pH: 6 (ref 5.0–8.0)

## 2021-05-01 LAB — COMPREHENSIVE METABOLIC PANEL
ALT: 19 U/L (ref 0–44)
AST: 22 U/L (ref 15–41)
Albumin: 3.7 g/dL (ref 3.5–5.0)
Alkaline Phosphatase: 54 U/L (ref 38–126)
Anion gap: 7 (ref 5–15)
BUN: 21 mg/dL (ref 8–23)
CO2: 25 mmol/L (ref 22–32)
Calcium: 8.5 mg/dL — ABNORMAL LOW (ref 8.9–10.3)
Chloride: 109 mmol/L (ref 98–111)
Creatinine, Ser: 1.11 mg/dL (ref 0.61–1.24)
GFR, Estimated: >60 mL/min (ref >60)
Glucose, Bld: 104 mg/dL — ABNORMAL HIGH (ref 70–99)
Potassium: 4.1 mmol/L (ref 3.5–5.1)
Sodium: 141 mmol/L (ref 135–145)
Total Bilirubin: 0.9 mg/dL (ref 0.3–1.2)
Total Protein: 6.4 g/dL — ABNORMAL LOW (ref 6.5–8.1)

## 2021-05-01 LAB — LIPASE, BLOOD: Lipase: 214 U/L — ABNORMAL HIGH (ref 11–51)

## 2021-05-01 LAB — TROPONIN I (HIGH SENSITIVITY)
Troponin I (High Sensitivity): 6 ng/L (ref <18)
Troponin I (High Sensitivity): 6 ng/L (ref <18)

## 2021-05-01 LAB — RESP PANEL BY RT-PCR (FLU A&B, COVID) ARPGX2
Influenza A by PCR: NEGATIVE
Influenza B by PCR: NEGATIVE
SARS Coronavirus 2 by RT PCR: NEGATIVE

## 2021-05-01 LAB — URINALYSIS, MICROSCOPIC (REFLEX)

## 2021-05-01 MED ORDER — IOHEXOL 350 MG/ML SOLN
85.0000 mL | Freq: Once | INTRAVENOUS | Status: AC | PRN
  Administered 2021-05-01: 85 mL via INTRAVENOUS

## 2021-05-01 MED ORDER — SODIUM CHLORIDE 0.9 % IV BOLUS
1000.0000 mL | Freq: Once | INTRAVENOUS | Status: AC
Start: 2021-05-01 — End: 2021-05-01
  Administered 2021-05-01: 1000 mL via INTRAVENOUS

## 2021-05-01 NOTE — Discharge Instructions (Addendum)
Make sure to drink fluids and plenty of rest  May use Imodium for diarrhea  Follow-up with primary care provider in the next few days for reevaluation  Let your cardiologist know by your episode of chest pain  Return for new or worsening symptoms

## 2021-05-01 NOTE — ED Notes (Signed)
Pt ambulated with standby assistance.  States he does not feel dizzy any longer.  Pt sats 95%

## 2021-05-01 NOTE — ED Notes (Signed)
Pt wheeled to front lobby and assisted into car.

## 2021-05-01 NOTE — ED Provider Notes (Signed)
[facility] EMERGENCY DEPARTMENT Provider Note   CSN: [id number] Arrival date & time: 05/01/21  [id number]     History Generalized weakness  David Gillan. is a 86 y.o. male with past medical history significant for CAD, CLL, prior MI who presents for evaluation of weakness.  Patient states he was at the grocery store with his wife on Wednesday.  Felt generally weak.  States he had to go sit out in the car due to his weakness.  Also developed some diarrhea at this time.  He denies any melena or blood per rectum.  Patient states he has had multiple episodes of diarrhea since.  No travel, sick contacts or antibiotic. He continues to be very fatigued and overall weak.  He denies any unilateral weakness, paresthesias, facial droop, slurred speech.  States he got up this morning was sitting on the couch and had a 1 to 2-second episode of chest pain to the center of his chest.  Patient states this did not radiate into his left arm, back or jaw.  No associated diaphoresis, nausea, vomiting or shortness of breath.  He does not typically get exertional or pleuritic chest pain at baseline.  He denies any PND, orthopnea, lower extremity swelling.  He denies any prior history of congestive heart failure.  On EMS arrival patient had orthostatic vital signs.  Patient does state that he feels lightheaded when he goes from sitting to standing.  He denies any prior history of PE or DVT.  Patient states his prior cancer is in remission.  He denies any current pain.  States he just feels "generally weak."  Also with overall decreased appetite. Denies additional aggravating or alleviating factors.  No fever, headache, shortness of breath, cough, abdominal pain, urinary complaints, numbness.  History obtained from patient and past medical records.  No interpreter used  HPI     Past Medical History:  Diagnosis Date   Abnormality of gait 12/18/2013   Arthritis    Bilateral foot-drop 04/02/2019   Biliary dyskinesia     CAD (coronary artery disease) 2012   a. DES x2 in ramus/OM and mid LAD in 2012  b. balloon angioplaty of OM 2015   CLL (chronic lymphocytic leukemia) ([place])    Colon cancer ([place]) 2013   Right hemicolectomy, did not tolerate chemotherapy   Colon polyps    tubular adenomas   Diverticula of colon 2010   Essential hypertension    Exposure to Agent Orange    Folic acid deficiency    Gastroparesis 2014   GERD (gastroesophageal reflux disease)    Glaucoma    Heart palpitations 2014   Hiatal hernia 2010   History of anemia as a child    History of kidney stones    Iron deficiency 2013   Myocardial infarction [facility]) 2012   Peripheral neuropathy    Prostate cancer ([place]) 2010   Radiation, surgery   PTSD (post-traumatic stress disorder)    Radiation proctitis    Rosacea     Patient Active Problem List   Diagnosis Date Noted   Central retinal vein occlusion of left eye 01/21/2020   Early stage nonexudative age-related macular degeneration of right eye 01/21/2020   Right epiretinal membrane 01/21/2020   Cystoid macular edema of left eye 01/21/2020   Advanced nonexudative age-related macular degeneration of left eye with subfoveal involvement 01/21/2020   Primary open angle glaucoma of both eyes, mild stage 01/21/2020   Bilateral foot-drop 04/02/2019   Decreased appetite 08/18/2017  Loss of weight 08/18/2017   Dark stools 08/18/2017   Morbid obesity ([place]) 05/02/2017   Vitamin D deficiency 11/26/2015   CLL (chronic lymphocytic leukemia) ([place]) 07/21/2015   Mucosal abnormality of esophagus    Mucosal abnormality of stomach    Multiple gastric polyps    History of colon cancer    Diverticulosis of colon without hemorrhage    Family history of prostate cancer    Family history of colon cancer    Family history of stomach cancer    Benign prostatic hyperplasia with urinary obstruction 09/12/2014   H/O prostate cancer 08/20/2014   Radiation proctitis 08/19/2014   H/O agent Orange  exposure 07/01/2014   Unstable angina ([place]) 06/16/2014   Shingles outbreak, right anterior thoracic 01/28/2014   Gait abnormality 12/18/2013   Viral gastroenteritis 11/26/2013   Dyslipidemia 02/12/2013   Peripheral neuropathy 10/14/2012   CAD (coronary artery disease) 10/14/2012   HTN (hypertension) 10/14/2012   Hx of adenomatous colonic polyps [id number]   Folic acid deficiency [id number]   Iron deficiency 05/02/2012   Cancer of ascending colon ([place]) 03/29/2012   GERD 03/09/2010   PREMATURE VENTRICULAR CONTRACTIONS 04/30/2009    Past Surgical History:  Procedure Laterality Date   CARDIAC CATHETERIZATION  02/13/2011   [facility] [place] [facility]ology   CATARACT EXTRACTION     2011   CATARACT EXTRACTION W/PHACO Right 08/06/2013   Procedure: CATARACT EXTRACTION PHACO AND INTRAOCULAR LENS PLACEMENT ([place]);  Surgeon: Tonny Branch, MD;  Location: [facility] OR[facility];  Service: Ophthalmology;  Laterality: Right;  CDE:20.96   CHOLECYSTECTOMY     COLONOSCOPY  02/12/2004   Dr. Gala Romney- L side diverticula, inflammatory  colon polyps   COLONOSCOPY  01/19/2012   RMR: Prominent changes involving the rectal mucosa consistent with radiation-induced proctitis. Multiple colonic  polyps removed as described above. Sigmoid Diverticulosis/ 1.5 x 2 cm relatively flat ulcerated lesion in cecum/path showed adenocarcinoma of the colon. descending colon polyp with tubular adenoma and one fragment of focal high grade dysplasia.    COLONOSCOPY  08/28/2012   [id number] proctitis. Colonic diverticulosis/Ulcerations at the surgical anastomosis  path: ulcerated colonic mucosa with prolapse changes, tubular adenoma.    COLONOSCOPY N/A 08/02/2013   RMR: Colonic polyps -removed as described above. Colonic diverticulosis. Status post reight hemicolectomy. Radiation proctitis- asymptomatic.    COLONOSCOPY N/A 05/09/2015   Procedure: COLONOSCOPY;  Surgeon: Daneil Dolin, MD;  Location: [facility];  Service: Endoscopy;  Laterality: N/A;   1200-moved to [address]5 [address] to notify pt   COLONOSCOPY WITH PROPOFOL N/A 11/10/2017   Procedure: COLONOSCOPY WITH PROPOFOL;  Surgeon: Daneil Dolin, MD;  Location: [facility];  Service: Endoscopy;  Laterality: N/A;   CORONARY ANGIOPLASTY  06/17/14   OM1 POBA   CORONARY STENT PLACEMENT  01/2011   2.5x2m Xience drug-eluting stent in ramus intermedius/OMI vessel, 2.5x655mPromus Element stent in mid LAD artery   ESOPHAGOGASTRODUODENOSCOPY  10/10/2008   Dr. RoTilda Burrowiatal hernia, normal esophagus, normal stomach   ESOPHAGOGASTRODUODENOSCOPY  02/12/2004   Dr. RoDudley Majornormal    ESOPHAGOGASTRODUODENOSCOPY N/A 05/09/2015   Procedure: ESOPHAGOGASTRODUODENOSCOPY (EGD);  Surgeon: RoDaneil DolinMD;  Location: [facility];  Service: Endoscopy;  Laterality: N/A;   ESOPHAGOGASTRODUODENOSCOPY (EGD) WITH PROPOFOL N/A 11/10/2017   Procedure: ESOPHAGOGASTRODUODENOSCOPY (EGD) WITH PROPOFOL;  Surgeon: RoDaneil DolinMD;  Location: [facility];  Service: Endoscopy;  Laterality: N/A;  9:45am   GIVENS CAPSULE STUDY N/A 06/03/2015   Procedure: GIVENS CAPSULE STUDY;  Surgeon: RoDaneil DolinMD;  Location:  [facility];  Service: Endoscopy;  Laterality: N/A;  0700   HOT HEMOSTASIS  11/10/2017   Procedure: HOT HEMOSTASIS (ARGON PLASMA COAGULATION/BICAP);  Surgeon: Daneil Dolin, MD;  Location: [facility];  Service: Endoscopy;;  rectum   KNEE SURGERY     left knee    LEFT HEART CATHETERIZATION WITH CORONARY ANGIOGRAM N/A 06/17/2014   Procedure: LEFT HEART CATHETERIZATION WITH CORONARY ANGIOGRAM;  Surgeon: Burnell Blanks, MD;  Location: [facility] CATH LAB;  Service: Cardiovascular;  Laterality: N/A;   PARTIAL COLECTOMY  02/29/2012   Procedure: PARTIAL COLECTOMY;  Surgeon: Adin Hector, MD;  Location: [facility];  Service: General;  Laterality: Right;   POLYPECTOMY  11/10/2017   Procedure: POLYPECTOMY;  Surgeon: Daneil Dolin, MD;  Location: [facility];  Service: Endoscopy;;  gastric colon   PROSTATECTOMY      TONSILLECTOMY     TRANSURETHRAL RESECTION OF PROSTATE     VASECTOMY         Family History  Problem Relation Age of Onset   Throat cancer Father        dx in his 63s and again in his 19s; pipe and cigar smoker   Neuropathy Brother    Prostate cancer Brother 54   Melanoma Brother        dx in his 40s   Cancer Paternal Uncle        smoking related cancer   Heart attack Paternal Grandfather    Stomach cancer Cousin 71   Colon cancer Neg Hx     Social History   Tobacco Use   Smoking status: Never   Smokeless tobacco: Never  Vaping Use   Vaping Use: Never used  Substance Use Topics   Alcohol use: Yes    Comment: 1 drink every 6 months   Drug use: No    Home Medications Prior to Admission medications   Medication Sig Start Date End Date Taking? Authorizing Provider  ALPRAZolam Duanne Moron) 0.5 MG tablet Take 0.5 mg by mouth daily as needed for anxiety.   Yes [provider]  aspirin EC 81 MG tablet Take 81 mg by mouth daily.   Yes [provider]  B Complex Vitamins (VITAMIN B COMPLEX PO) Take 1 tablet by mouth at bedtime.    Yes [provider]  benzonatate (TESSALON) 100 MG capsule Take 1 capsule by mouth every 8 (eight) hours for cough. 02/10/21  Yes Hagler, Aaron Edelman, MD  cholecalciferol (VITAMIN D3) 25 MCG (1000 UNIT) tablet Take 1,000 Units by mouth 2 (two) times daily.   Yes [provider]  citalopram (CELEXA) 20 MG tablet Take 10 mg by mouth daily.    Yes [provider]  clopidogrel (PLAVIX) 75 MG tablet TAKE 1 TABLET BY MOUTH ONCE DAILY. 06/09/20  Yes Satira Sark, MD  fluticasone (FLONASE) 50 MCG/ACT nasal spray Place 1 spray into both nostrils at bedtime. 09/08/19  Yes [provider]  folic acid (FOLVITE) 1 MG tablet TAKE (1) TABLET BY MOUTH ONCE DAILY . Patient taking differently: Take 1 mg by mouth daily. 07/13/17  Yes Holley Bouche, NP  ibuprofen (ADVIL,MOTRIN) 200 MG tablet Take 400 mg by mouth daily  as needed for headache or moderate pain.   Yes [provider]  latanoprost (XALATAN) 0.005 % ophthalmic solution 1 DROP IN [facility] EYE AT BEDTIME. 04/06/21  Yes Rankin, Clent Demark, MD  loperamide (IMODIUM) 2 MG capsule Take 1 capsule (2 mg total) by mouth as needed for  diarrhea or loose stools (maximum 16 mg per day). 11/13/20  Yes Dorie Rank, MD  losartan (COZAAR) 50 MG tablet Take 25 mg by mouth 2 (two) times daily.    Yes [provider]  metoprolol (LOPRESSOR) 50 MG tablet Take 50 mg by mouth 2 (two) times daily.   Yes [provider]  montelukast (SINGULAIR) 10 MG tablet Take 10 mg by mouth at bedtime.   Yes [provider]  Multiple Vitamin (MULTIVITAMIN WITH MINERALS) TABS tablet Take 1 tablet by mouth daily.   Yes [provider]  nitroGLYCERIN (NITROSTAT) 0.4 MG SL tablet PLACE ONE (1) TABLET UNDER TONGUE EVERY 5 MINUTES UP TO (3) DOSES AS NEEDED FOR CHEST PAIN. 08/20/20  Yes Satira Sark, MD  pantoprazole (PROTONIX) 40 MG tablet TAKE (1) TABLET TWICE DAILY. 01/04/21  Yes Annitta Needs, NP  pravastatin (PRAVACHOL) 40 MG tablet TAKE 1 TABLET BY MOUTH AT BEDTIME. 03/06/21  Yes Satira Sark, MD  vitamin B-12 (CYANOCOBALAMIN) 1000 MCG tablet Take by mouth daily.    Yes [provider]  Apoaequorin (PREVAGEN EXTRA STRENGTH) 20 MG CAPS Take 1 capsule by mouth daily.    [provider]  ondansetron (ZOFRAN) 4 MG tablet Take 1 tablet (4 mg total) by mouth every 6 (six) hours as needed for nausea or vomiting. Patient not taking: No sig reported 11/11/20   Rourk, Cristopher Estimable, MD    Allergies    Shellfish allergy and Sulfonamide derivatives  Review of Systems   Review of Systems  Constitutional:  Positive for activity change, appetite change and fatigue. Negative for chills, diaphoresis, fever and unexpected weight change.  HENT: Negative.    Respiratory:  Positive for cough (chronic). Negative for apnea, choking, chest tightness,  shortness of breath, wheezing and stridor.   Cardiovascular:  Positive for chest pain (2 hours PTA, 1-2 sec, resolved). Negative for palpitations and leg swelling.  Gastrointestinal:  Positive for diarrhea and nausea. Negative for abdominal distention, abdominal pain, anal bleeding, blood in stool, constipation, rectal pain and vomiting.  Genitourinary: Negative.   Musculoskeletal: Negative.   Skin: Negative.   Neurological:  Positive for weakness (Generalized) and light-headedness (Orthostatic). Negative for dizziness, tremors, seizures, syncope, facial asymmetry, speech difficulty, numbness and headaches.  All other systems reviewed and are negative.  Physical Exam Updated Vital Signs BP (!) 167/70   Pulse (!) 53   Temp 98.2 F (36.8 C) (Oral)   Resp 16   Ht '5\' 11"'$  (1.803 m)   Wt 97.5 kg   SpO2 100%   BMI 29.99 kg/m   Physical Exam Vitals and nursing note reviewed.  Constitutional:      General: He is not in acute distress.    Appearance: He is well-developed. He is not ill-appearing, toxic-appearing or diaphoretic.  HENT:     Head: Normocephalic and atraumatic.     Nose: Nose normal.     Mouth/Throat:     Mouth: Mucous membranes are moist.  Eyes:     Pupils: Pupils are equal, round, and reactive to light.  Cardiovascular:     Rate and Rhythm: Normal rate and regular rhythm.     Pulses: Normal pulses.          Radial pulses are 2+ on the right side and 2+ on the left side.     Heart sounds: Normal heart sounds.  Pulmonary:     Effort: Pulmonary effort is normal. No respiratory distress.     Breath sounds: Normal breath  sounds.     Comments: Clear bilaterally.  Speaks in full sentences without difficulty Abdominal:     General: Bowel sounds are normal. There is no distension.     Palpations: Abdomen is soft.     Tenderness: There is no abdominal tenderness. There is no right CVA tenderness, left CVA tenderness, guarding or rebound.     Hernia: A hernia is present.      Comments: Soft, nontender.  Prior surgical scar without any overlying skin changes. Soft reducible hernia to right abdomen  Musculoskeletal:        General: No swelling, tenderness, deformity or signs of injury. Normal range of motion.     Cervical back: Normal range of motion and neck supple.     Right lower leg: No edema.     Left lower leg: No edema.  Skin:    General: Skin is warm and dry.     Capillary Refill: Capillary refill takes 2 to 3 seconds.  Neurological:     General: No focal deficit present.     Mental Status: He is alert and oriented to person, place, and time.     Cranial Nerves: Cranial nerves are intact.     Sensory: Sensation is intact.     Motor: Motor function is intact.     Coordination: Coordination is intact.     Gait: Gait normal.     Comments: CN 2-12 grossly Intact Equal strength Intact sensation Neg drift A/Ox3    ED Results / Procedures / Treatments   Labs (all labs ordered are listed, but only abnormal results are displayed) Labs Reviewed  CBC WITH DIFFERENTIAL/PLATELET - Abnormal; Notable for the following components:      Result Value   WBC 14.0 (*)    Lymphs Abs 6.7 (*)    Eosinophils Absolute 0.7 (*)    All other components within normal limits  COMPREHENSIVE METABOLIC PANEL - Abnormal; Notable for the following components:   Glucose, Bld 104 (*)    Calcium 8.5 (*)    Total Protein 6.4 (*)    All other components within normal limits  LIPASE, BLOOD - Abnormal; Notable for the following components:   Lipase 214 (*)    All other components within normal limits  URINALYSIS, ROUTINE W REFLEX MICROSCOPIC - Abnormal; Notable for the following components:   Hgb urine dipstick TRACE (*)    Leukocytes,Ua SMALL (*)    All other components within normal limits  URINALYSIS, MICROSCOPIC (REFLEX) - Abnormal; Notable for the following components:   Bacteria, UA RARE (*)    All other components within normal limits  RESP PANEL BY RT-PCR (FLU A&B,  COVID) ARPGX2  TROPONIN I (HIGH SENSITIVITY)  TROPONIN I (HIGH SENSITIVITY)    EKG None  Radiology CT Abdomen Pelvis W Contrast  Result Date: 05/01/2021 CLINICAL DATA:  Diarrhea and epigastric pain. Left-sided abdominal pain. Weakness. History of colon cancer and prostate cancer with partial colectomy. EXAM: CT ABDOMEN AND PELVIS WITH CONTRAST TECHNIQUE: Multidetector CT imaging of the abdomen and pelvis was performed using the standard protocol following bolus administration of intravenous contrast. CONTRAST:  23m OMNIPAQUE IOHEXOL 350 MG/ML SOLN COMPARISON:  11/24/2020 FINDINGS: Lower chest: Left anterior descending and right coronary artery atherosclerosis along with calcifications along the mitral valve and descending thoracic aortic atherosclerotic calcification. Speckled density in the posterior basal segment right lower lobe is unchanged and process play residua from prior infection or barium aspiration. Mild chronic scarring or atelectasis along the left hemidiaphragm. Herniation  of adipose tissue along the hiatus, with the pancreas extending towards the hiatus on image 21 of series 3 but without overt herniation of the pancreas. No substantial gastric herniation along the hiatus. Hepatobiliary: Cholecystectomy. Stable 1.0 cm hypodense lesion along the base of the caudate lobe on image 33 series 3, probably a cyst. Tiny subcapsular hypodense lesion along the right hepatic lobe medially on image 40 series 3, technically too small to characterize but probably a cyst. Both lesions are relatively stable back through 06/11/2016, and no new or progressive lesion is observed. Pancreas: Unremarkable Spleen: Unremarkable Adrenals/Urinary Tract: Fluid density 3.1 by 2.6 cm exophytic cyst from the left kidney lower pole on image 48 series 3. Both adrenal glands appear normal. Urinary bladder unremarkable. Stomach/Bowel: Diverticulum of the transverse duodenum does not appear inflamed. Prior right  hemicolectomy. Sigmoid colon diverticulosis without findings of active diverticulitis. Vascular/Lymphatic: Atherosclerosis is present, including aortoiliac atherosclerotic disease. Reproductive: Fiducials are present along the prostate gland. Other: No supplemental non-categorized findings. Musculoskeletal: Lower thoracic and lumbar spondylosis and degenerative disc disease, with suspected mild right foraminal impingement at the L4-5 level. There is grade 1 degenerative retrolisthesis at the L2-3 level. Small supraumbilical hernia contains adipose tissue on image 72 series 7. IMPRESSION: 1. There is sigmoid diverticulosis but no active diverticulitis to correlate with the patient's left-sided abdominal pain. 2. Other imaging findings of potential clinical significance: Aortic Atherosclerosis (ICD10-I70.0). Coronary atherosclerosis. Speckled density in the right lower lobe likely from either remote chronic infectious process or prior barium aspiration. Herniation of adipose tissue along the hiatus without gastric herniation; the pancreas extends towards the hiatus but is not herniated. Chronically stable hypodense lesions along the base of the caudate lobe and right hepatic lobe are unchanged from 2017 and considered benign. Mild right foraminal impingement at L4-5. Small supraumbilical hernia contains adipose tissue. Electronically Signed   By: Van Clines M.D.   On: 05/01/2021 12:06   DG Chest Portable 1 View  Result Date: 05/01/2021 CLINICAL DATA:  Chest pain EXAM: PORTABLE CHEST 1 VIEW COMPARISON:  Chest radiograph 06/16/2014 FINDINGS: The heart is mildly enlarged. The mediastinal contours are within normal limits. The apices lung are hyperlucent raising suspicion for underlying COPD. Reticular opacities in the lung bases likely reflect atelectasis and/or scar. There is no focal consolidation. There is no pulmonary edema. There is no pleural effusion or pneumothorax. There is no acute osseous  abnormality. IMPRESSION: 1. Mild cardiomegaly. 2. Suspect underlying COPD. 3. No evidence of acute cardiopulmonary process. Electronically Signed   By: Valetta Mole M.D.   On: 05/01/2021 10:05    Procedures Procedures   Medications Ordered in ED Medications  sodium chloride 0.9 % bolus 1,000 mL (0 mLs Intravenous Stopped 05/01/21 1101)  iohexol (OMNIPAQUE) 350 MG/ML injection 85 mL (85 mLs Intravenous Contrast Given 05/01/21 1105)    ED Course  I have reviewed the triage vital signs and the nursing notes.  Pertinent labs & imaging results that were available during my care of the patient were reviewed by me and considered in my medical decision making (see chart for details).  86 year old here for evaluation multiple complaints.  He is afebrile, nonseptic, not ill-appearing.  His heart and lungs are clear.  Has generalized weakness however has nonfocal neuro exam without deficits I have low suspicion for acute CVA as cause of his weakness.  His abdomen soft, nontender.  Has had some episodes of diarrhea however reassuring he denies any melena or bright blood per rectum.  He is  orthostatic here.  He did have an episode of chest pain earlier today which lasted 1 to 2 seconds no associated symptoms.  Does have history of CAD with prior MI however denies any exertional chest pain at baseline.  Symptoms do not seem consistent with ACS, PE, dissection however will obtain troponins given history as well as EKG and chest x-ray.  We will plan on IV fluid bolus, check lab and reassess  Labs and imaging personally reviewed and interpreted:  COVID/ Flu negative UA negative for infection Lipase 214 Trop flat 6>>6 CBC with leukocytosis at 0000000 Metabolic panel glucose [id number], no additional electrolyte, renal or liver abnormality EKG without ischemic changes Chest x-ray with mild cardiomegaly.  No infiltrates, pulmonary edema, pneumothorax CT abdomen pelvis without any acute findings, specifically no  pancreatitis  Patient reassessed.  States he feels significantly improved after IV fluids and wants to go home.  Suspect viral etiology of patient's symptoms.  Discussed rest, push fluids.  He will notify cardiology of his earlier chest pain however at this time I have low suspicion for acute ACS, PE, dissection given history, reassuring troponins.  Instructed him to return for any new or worsening symptoms which she is agreeable  The patient has been appropriately medically screened and/or stabilized in the ED. I have low suspicion for any other emergent medical condition which would require further screening, evaluation or treatment in the ED or require inpatient management.  Patient is hemodynamically stable and in no acute distress.  Patient able to ambulate in department prior to ED.  Evaluation does not show acute pathology that would require ongoing or additional emergent interventions while in the emergency department or further inpatient treatment.  I have discussed the diagnosis with the patient and answered all questions.  Pain is been managed while in the emergency department and patient has no further complaints prior to discharge.  Patient is comfortable with plan discussed in room and is stable for discharge at this time.  I have discussed strict return precautions for returning to the emergency department.  Patient was encouraged to follow-up with PCP/specialist refer to at discharge.      MDM Rules/Calculators/A&P                            Final Clinical Impression(s) / ED Diagnoses Final diagnoses:  Weakness  Dehydration    Rx / DC Orders ED Discharge Orders     None        Nettie Elm, PA-C 05/01/21 1638    Luna Fuse, MD 05/04/21 [phone]

## 2021-05-01 NOTE — ED Triage Notes (Signed)
Pt states he was in the grocery store on Wednesday and develop chest pain and general weakness.  Pt says he is weak all over,  pt denies chest pain at this time.  Pt has a history of falls and is concerned.

## 2021-05-07 ENCOUNTER — Telehealth: Payer: Self-pay | Admitting: Cardiology

## 2021-05-07 NOTE — Telephone Encounter (Signed)
Notified via detailed voice message.  

## 2021-05-07 NOTE — Telephone Encounter (Signed)
Will fwd to pharm d for advice.

## 2021-05-07 NOTE — Telephone Encounter (Signed)
Either antibiotic is fine for patient to take. Doxycycline would be easier to take because it is twice a day vs TID.

## 2021-05-07 NOTE — Telephone Encounter (Signed)
Pt came into the office today after having a procedure for Basal Cell Carcinoma on his Rt ear   Dr. Nevada Crane Dermatologist is wanting to know which Antibiotic would be ok for the pt to take - please call [phone] and let the pt know which he should take to the pharmacy.

## 2021-05-11 NOTE — Progress Notes (Signed)
Cardiology Office Note  Date: 05/12/2021   ID: David Ades., DOB 21-Aug-1935, MRN [id number]  PCP:  Leeanne Rio, MD  Cardiologist:  Rozann Lesches, MD Electrophysiologist:  None   Chief Complaint: 54-monthfollow-up  History of Present Illness: David Pugh is a 86y.o. male with a history of CAD/MI, HTN, palpitations, GERD, iron deficiency, CLL  Status post DES x2 to ramus intermedius/obtuse marginal and mid LAD 2012.  Subsequent angioplasty of obtuse marginal 2015.  He was last seen by Dr. MDomenic Polite12/22/2021 for routine visit.  He is doing well overall without angina or nitroglycerin use.  He remains functional with ADLs.  Cardiac regimen was stable.  Had no active bleeding on aspirin or Plavix.  No intolerance to Pravachol.  LDL was well controlled.  He is continuing sublingual nitroglycerin, aspirin, Plavix, Cozaar, Lopressor, Pravachol.   He was recently seen in the emergency room on 05/01/2021 for weakness.  He and his wife were at the grocery store when he began feeling generally weak.  Also complained of some diarrhea.  Complained of subsequent diarrhea episodes.  He got up the next day sitting on the couch had a 1 to 2-second episode of chest pain to center of chest without radiation to left arm, back, jaw.  No associated diaphoresis, nausea, vomiting, shortness of breath.  On arrival via EMS he had orthostatic vital signs.  He complained of feeling lightheaded when going from sitting to standing.  He described feeling "generally weak".  Also complained of decreased appetite.  He was negative for COVID and flu.  UA was negative for infection.  Lipase was 214, troponins flat at 6 and 6.  Metabolic panel glucose of 104, no other electrolyte or renal abnormalities.  EKG was without ischemic changes, chest x-ray mild cardiomegaly no infiltrates or pulmonary edema CT of abdomen pelvis without any acute findings, no pancreatitis.  He was treated with IV fluids.  Viral  etiology was suspected.  Rest and fluid hydration suggested.  He is here for 669-monthollow-up and status post recent emergency room visit for weakness.  He was treated for dehydration secondary to diarrhea.  He was ruled out for MI.  States he is feeling much better since hospital discharge.  Denies any further issues with diarrhea.  Denies any sense of weakness.  No shortness of breath or DOE.  No anginal symptoms, no orthostatic symptoms.  Denies any bleeding issues.  Systolic blood pressure is up today.  He states normally at home can be in the 140 range but at other times usually runs in the range of the [id number].  He denies any CVA or TIA-like symptoms, PND, orthopnea, claudication-like symptoms, DVT or PE-like symptoms, or lower extremity edema.  He states he recently had an ABI study at the [facility] which was part of a routine screening for PAD which was normal per his statement.  He is a retired [employer]  Past Medical History:  Diagnosis Date   Abnormality of gait 12/18/2013   Arthritis    Bilateral foot-drop 04/02/2019   Biliary dyskinesia    CAD (coronary artery disease) 2012   a. DES x2 in ramus/OM and mid LAD in 2012  b. balloon angioplaty of OM 2015   CLL (chronic lymphocytic leukemia) ([facility]   [facility] [place]   Right hemicolectomy, did not tolerate chemotherapy   Colon polyps    tubular adenomas   Diverticula of colon  2010   Essential hypertension    Exposure to Agent Orange    Folic acid deficiency    Gastroparesis 2014   GERD (gastroesophageal reflux disease)    Glaucoma    Heart palpitations 2014   Hiatal hernia 2010   History of anemia as a child    History of kidney stones    Iron deficiency 2013   Myocardial infarction [facility]) 2012   Peripheral neuropathy    Prostate cancer ([place]) 2010   Radiation, surgery   PTSD (post-traumatic stress disorder)    Radiation proctitis    Rosacea     Past Surgical History:  Procedure  Laterality Date   CARDIAC CATHETERIZATION  02/13/2011   [facility] cardiology   CATARACT EXTRACTION     2011   CATARACT EXTRACTION W/PHACO Right 08/06/2013   Procedure: CATARACT EXTRACTION PHACO AND INTRAOCULAR LENS PLACEMENT ([place]);  Surgeon: Tonny Branch, MD;  Location: [facility];  Service: Ophthalmology;  Laterality: Right;  CDE:20.96   CHOLECYSTECTOMY     COLONOSCOPY  02/12/2004   Dr. Gala Romney- L side diverticula, inflammatory  colon polyps   COLONOSCOPY  01/19/2012   RMR: Prominent changes involving the rectal mucosa consistent with radiation-induced proctitis. Multiple colonic  polyps removed as described above. Sigmoid Diverticulosis/ 1.5 x 2 cm relatively flat ulcerated lesion in cecum/path showed adenocarcinoma of the colon. descending colon polyp with tubular adenoma and one fragment of focal high grade dysplasia.    COLONOSCOPY  08/28/2012   [id number] proctitis. Colonic diverticulosis/Ulcerations at the surgical anastomosis  path: ulcerated colonic mucosa with prolapse changes, tubular adenoma.    COLONOSCOPY N/A 08/02/2013   RMR: Colonic polyps -removed as described above. Colonic diverticulosis. Status post reight hemicolectomy. Radiation proctitis- asymptomatic.    COLONOSCOPY N/A 05/09/2015   Procedure: COLONOSCOPY;  Surgeon: Daneil Dolin, MD;  Location: [facility];  Service: Endoscopy;  Laterality: N/A;  1200-moved to 1145 [place] to notify pt   COLONOSCOPY WITH PROPOFOL N/A 11/10/2017   Procedure: COLONOSCOPY WITH PROPOFOL;  Surgeon: Daneil Dolin, MD;  Location: [facility];  Service: Endoscopy;  Laterality: N/A;   CORONARY ANGIOPLASTY  06/17/14   OM1 POBA   CORONARY STENT PLACEMENT  01/2011   2.5x29m Xience drug-eluting stent in ramus intermedius/OMI vessel, 2.5x658mPromus Element stent in mid LAD artery   ESOPHAGOGASTRODUODENOSCOPY  10/10/2008   Dr. RoTilda Burrowiatal hernia, normal esophagus, normal stomach   ESOPHAGOGASTRODUODENOSCOPY  02/12/2004   Dr. RoDudley Majornormal     ESOPHAGOGASTRODUODENOSCOPY N/A 05/09/2015   Procedure: ESOPHAGOGASTRODUODENOSCOPY (EGD);  Surgeon: RoDaneil DolinMD;  [facility];  Service: Endoscopy;  Laterality: N/A;   ESOPHAGOGASTRODUODENOSCOPY (EGD) WITH PROPOFOL N/A 11/10/2017   Procedure: ESOPHAGOGASTRODUODENOSCOPY (EGD) WITH PROPOFOL;  Surgeon: RoDaneil DolinMD;  [facility];  Service: Endoscopy;  Laterality: N/A;  9:45am   GIVENS CAPSULE STUDY N/A 06/03/2015   Procedure: GIVENS CAPSULE STUDY;  Surgeon: RoDaneil DolinMD;  [facility];  Service: Endoscopy;  Laterality: N/A;  0700   HOT HEMOSTASIS  11/10/2017   Procedure: HOT HEMOSTASIS (ARGON PLASMA COAGULATION/BICAP);  Surgeon: RoDaneil DolinMD;  [facility];  Service: Endoscopy;;  rectum   KNEE SURGERY     left knee    LEFT HEART CATHETERIZATION WITH CORONARY ANGIOGRAM N/A 06/17/2014   Procedure: LEFT HEART CATHETERIZATION WITH CORONARY ANGIOGRAM;  Surgeon: ChBurnell BlanksMD;  [facility];  Service: Cardiovascular;  Laterality: N/A;   PARTIAL COLECTOMY  02/29/2012  Procedure: PARTIAL COLECTOMY;  Surgeon: Adin Hector, MD;  Location: [facility];  Service: General;  Laterality: Right;   POLYPECTOMY  11/10/2017   Procedure: POLYPECTOMY;  Surgeon: Daneil Dolin, MD;  [facility];  Service: Endoscopy;;  gastric colon   PROSTATECTOMY     TONSILLECTOMY     TRANSURETHRAL RESECTION OF PROSTATE     VASECTOMY      Current Outpatient Medications  Medication Sig Dispense Refill   ALPRAZolam (XANAX) 0.5 MG tablet Take 0.5 mg by mouth daily as needed for anxiety.     Apoaequorin (PREVAGEN EXTRA STRENGTH) 20 MG CAPS Take 1 capsule by mouth daily.     aspirin EC 81 MG tablet Take 81 mg by mouth daily.     B Complex Vitamins (VITAMIN B COMPLEX PO) Take 1 tablet by mouth at bedtime.      benzonatate (TESSALON) 100 MG capsule Take 1 capsule by mouth every 8 (eight) hours for cough. 21 capsule 0    cholecalciferol (VITAMIN D3) 25 MCG (1000 UNIT) tablet Take 1,000 Units by mouth 2 (two) times daily.     citalopram (CELEXA) 20 MG tablet Take 10 mg by mouth daily.      clopidogrel (PLAVIX) 75 MG tablet TAKE 1 TABLET BY MOUTH ONCE DAILY. 30 tablet 11   fluticasone (FLONASE) 50 MCG/ACT nasal spray Place 1 spray into both nostrils at bedtime.     folic acid (FOLVITE) 1 MG tablet TAKE (1) TABLET BY MOUTH ONCE DAILY . (Patient taking differently: Take 1 mg by mouth daily.) 30 tablet 0   ibuprofen (ADVIL,MOTRIN) 200 MG tablet Take 400 mg by mouth daily as needed for headache or moderate pain.     latanoprost (XALATAN) 0.005 % ophthalmic solution 1 DROP IN EACH EYE AT BEDTIME. 2.5 mL 0   loperamide (IMODIUM) 2 MG capsule Take 1 capsule (2 mg total) by mouth as needed for diarrhea or loose stools (maximum 16 mg per day). 30 capsule 0   losartan (COZAAR) 50 MG tablet Take 25 mg by mouth 2 (two) times daily.      metoprolol (LOPRESSOR) 50 MG tablet Take 50 mg by mouth 2 (two) times daily.     montelukast (SINGULAIR) 10 MG tablet Take 10 mg by mouth at bedtime.     Multiple Vitamin (MULTIVITAMIN WITH MINERALS) TABS tablet Take 1 tablet by mouth daily.     nitroGLYCERIN (NITROSTAT) 0.4 MG SL tablet PLACE ONE (1) TABLET UNDER TONGUE EVERY 5 MINUTES UP TO (3) DOSES AS NEEDED FOR CHEST PAIN. 25 tablet 3   ondansetron (ZOFRAN) 4 MG tablet Take 1 tablet (4 mg total) by mouth every 6 (six) hours as needed for nausea or vomiting. 20 tablet 0   pantoprazole (PROTONIX) 40 MG tablet TAKE (1) TABLET TWICE DAILY. 60 tablet 5   pravastatin (PRAVACHOL) 40 MG tablet TAKE 1 TABLET BY MOUTH AT BEDTIME. 30 tablet 0   vitamin B-12 (CYANOCOBALAMIN) 1000 MCG tablet Take by mouth daily.      No current facility-administered medications for this visit.   Allergies:  Shellfish allergy and Sulfonamide derivatives   Social History: The patient  reports that he has never smoked. He has never used smokeless tobacco. He reports  current alcohol use. He reports that he does not use drugs.   Family History: The patient's family history includes Cancer in his paternal uncle; Heart attack in his paternal grandfather; Melanoma in his brother; Neuropathy in his brother; Prostate cancer (age of onset: 51)  in his brother; Stomach cancer (age of onset: 56) in his cousin; Throat cancer in his father.   ROS:  Please see the history of present illness. Otherwise, complete review of systems is positive for none.  All other systems are reviewed and negative.   Physical Exam: VS:  BP (!) 150/70   Pulse (!) 56   Ht 5' 10.5" (1.791 m)   Wt 214 lb 12.8 oz (97.4 kg)   SpO2 99%   BMI 30.38 kg/m , BMI Body mass index is 30.38 kg/m.  Wt Readings from Last 3 Encounters:  05/12/21 214 lb 12.8 oz (97.4 kg)  05/01/21 215 lb (97.5 kg)  01/21/21 213 lb 1.6 oz (96.7 kg)    General: Patient appears comfortable at rest. Neck: Supple, no elevated JVP or carotid bruits, no thyromegaly. Lungs: Clear to auscultation, nonlabored breathing at rest. Cardiac: Regular rate and rhythm, no S3 or significant systolic murmur, no pericardial rub. Extremities: No pitting edema, distal pulses 2+. Skin: Warm and dry. Musculoskeletal: No kyphosis. Neuropsychiatric: Alert and oriented x3, affect grossly appropriate.  ECG:  [facility] ED on 05/01/2021 sinus bradycardia rate of 53, nonspecific intraventricular conduction delay, borderline T wave abnormality  Recent Labwork: 05/01/2021: ALT 19; AST 22; BUN 21; Creatinine, Ser 1.11; Hemoglobin 14.2; Platelets 326; Potassium 4.1; Sodium 141     Component Value Date/Time   CHOL 133 04/27/2017 0908   TRIG 106 04/27/2017 0908   HDL 63 04/27/2017 0908   CHOLHDL 2.1 04/27/2017 0908   CHOLHDL 1.7 05/05/2016 0840   VLDL 15 05/05/2016 0840   LDLCALC 49 04/27/2017 0908    Other Studies Reviewed Today:  Carotid artery duplex study 09/11/2020   RIGHT CAROTID ARTERY: Minimal scattered calcified  plaque of the proximal internal carotid artery. Mildly elevated peak systolic velocities in the mid and proximal ICA likely artifact given lack of significant plaque.   RIGHT VERTEBRAL ARTERY:  Antegrade flow   LEFT CAROTID ARTERY: Mild heterogeneous plaque noted in the carotid bulb.   LEFT VERTEBRAL ARTERY:  Antegrade flow   IMPRESSION: Less than 50% stenosis of the internal carotid arteries.    ABIs 02/14/2019 Summary:  Right: Resting right ankle-brachial index is within normal range. No  evidence of significant right lower extremity arterial disease. The right  toe-brachial index is normal. PPG tracings appear dampened.   Left: Resting left ankle-brachial index is within normal range. No  evidence of significant left lower extremity arterial disease. The left  toe-brachial index is normal.    CT abdomen pelvis 05/01/2021 IMPRESSION: 1. There is sigmoid diverticulosis but no active diverticulitis to correlate with the patient's left-sided abdominal pain. 2. Other imaging findings of potential clinical significance: Aortic Atherosclerosis (ICD10-I70.0). Coronary atherosclerosis. Speckled density in the right lower lobe likely from either remote chronic infectious process or prior barium aspiration. Herniation of adipose tissue along the hiatus without gastric herniation; the pancreas extends towards the hiatus but is not herniated. Chronically stable hypodense lesions along the base of the caudate lobe and right hepatic lobe are unchanged from 2017 and considered benign. Mild right foraminal impingement at L4-5. Small supraumbilical hernia contains adipose tissue.     Assessment and Plan:  1. CAD in native artery   2. Mixed hyperlipidemia    1. CAD in native artery Currently denies any anginal or exertional symptoms.  Continue aspirin 81 mg daily, nitroglycerin sublingual as needed, Plavix 75 mg daily.  2. Mixed hyperlipidemia Continue pravastatin 40 mg p.o.  daily.  3.  Essential hypertension Blood pressure elevated today 150/78.  However, patient states systolic can run anywhere from 120s to 140s at home.  Continue losartan 25 mg p.o. daily.  Continue metoprolol 50 mg p.o. twice daily   Medication Adjustments/Labs and Tests Ordered: Current medicines are reviewed at length with the patient today.  Concerns regarding medicines are outlined above.   Disposition: Follow-up with Domenic Polite or APP 6 months  Signed, Levell July, NP 05/12/2021 10:42 AM    [facility] [place], [place], [place] [address] Phone: [phone]; Fax: [phone]

## 2021-05-12 ENCOUNTER — Ambulatory Visit (INDEPENDENT_AMBULATORY_CARE_PROVIDER_SITE_OTHER): Payer: Medicare PPO | Admitting: Family Medicine

## 2021-05-12 ENCOUNTER — Encounter: Payer: Self-pay | Admitting: Family Medicine

## 2021-05-12 VITALS — BP 150/70 | HR 56 | Ht 70.5 in | Wt 214.8 lb

## 2021-05-12 DIAGNOSIS — I2510 Atherosclerotic heart disease of native coronary artery without angina pectoris: Secondary | ICD-10-CM

## 2021-05-12 DIAGNOSIS — E782 Mixed hyperlipidemia: Secondary | ICD-10-CM

## 2021-05-12 NOTE — Patient Instructions (Addendum)
Medication Instructions:  none  Labwork: none  Testing/Procedures: none  Follow-Up: Your physician recommends that you schedule a follow-up appointment in: 6 months   Any Other Special Instructions Will Be Listed Below (If Applicable).  If you need a refill on your cardiac medications before your next appointment, please call your pharmacy.

## 2021-06-03 ENCOUNTER — Other Ambulatory Visit (INDEPENDENT_AMBULATORY_CARE_PROVIDER_SITE_OTHER): Payer: Self-pay | Admitting: Ophthalmology

## 2021-06-10 ENCOUNTER — Other Ambulatory Visit: Payer: Self-pay | Admitting: Cardiology

## 2021-07-02 ENCOUNTER — Other Ambulatory Visit (INDEPENDENT_AMBULATORY_CARE_PROVIDER_SITE_OTHER): Payer: Self-pay | Admitting: Ophthalmology

## 2021-07-25 IMAGING — CT CT ABD-PELV W/ CM
2 of 5 series · 16 of 46 positions shown, 18 images · IV contrast (Omnipaque or Isovue)
Comparison: 06/14/17

CLINICAL DATA: Epigastric pain. For 1 month. History of prostate
cancer, and colon cancer. Status post partial colectomy.

EXAM:
CT ABDOMEN AND PELVIS WITH CONTRAST
TECHNIQUE: Multidetector CT imaging of the abdomen and pelvis was performed
using the standard protocol following bolus administration of
intravenous contrast.
CONTRAST:  100mL OMNIPAQUE IOHEXOL 300 MG/ML  SOLN

[Series 2: axial st · axial · 0.84mm/px · z∈[+937,+1327]mm · 13 of 90 slices shown, 15 images]
[im 6/90  soft-tissue]
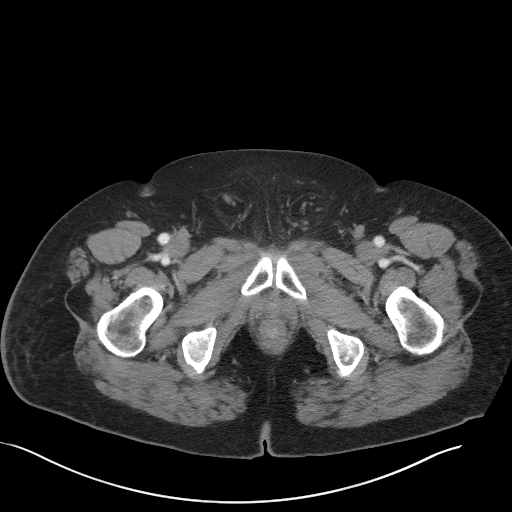
[im 6/90  bone]
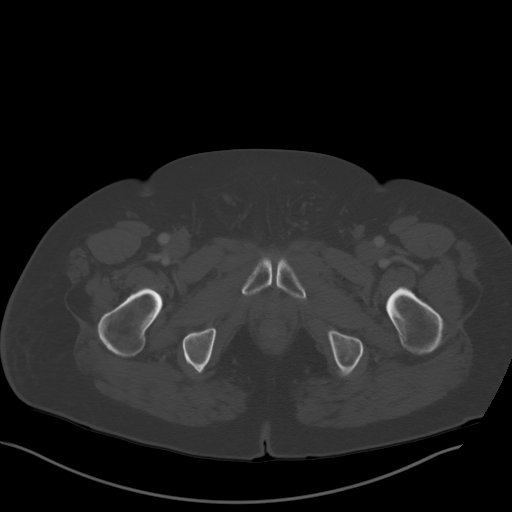
[im 12/90  soft-tissue]
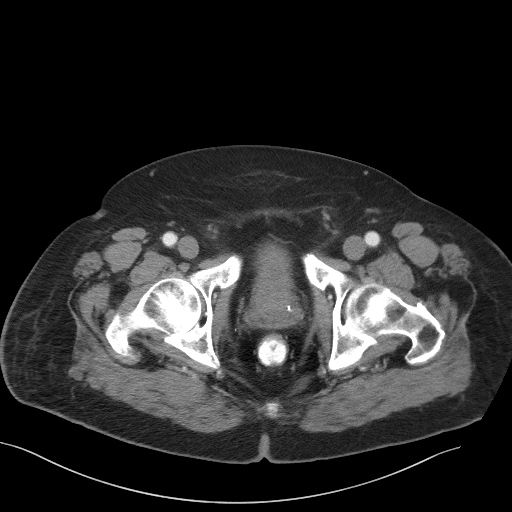
[im 17/90  soft-tissue]
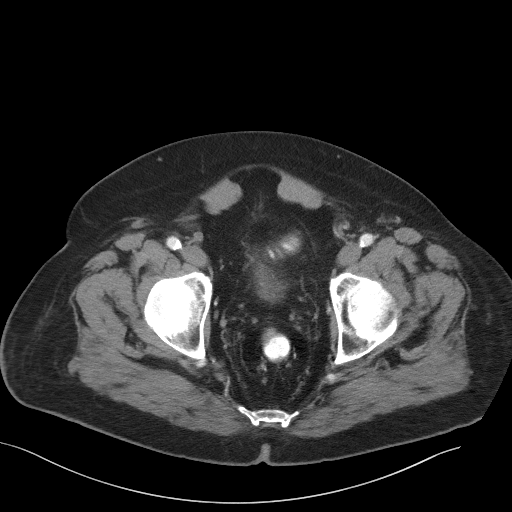
[im 28/90  soft-tissue]
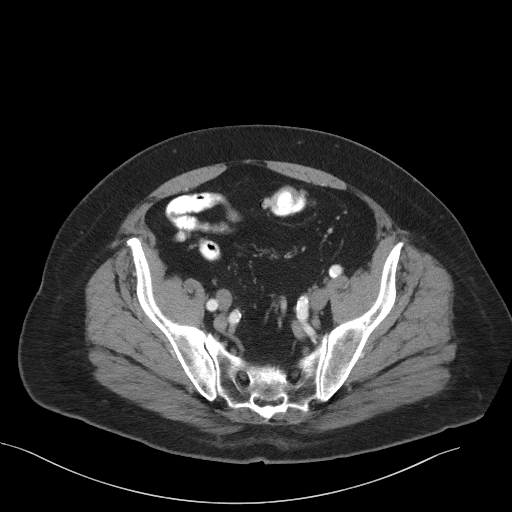
[im 34/90  soft-tissue]
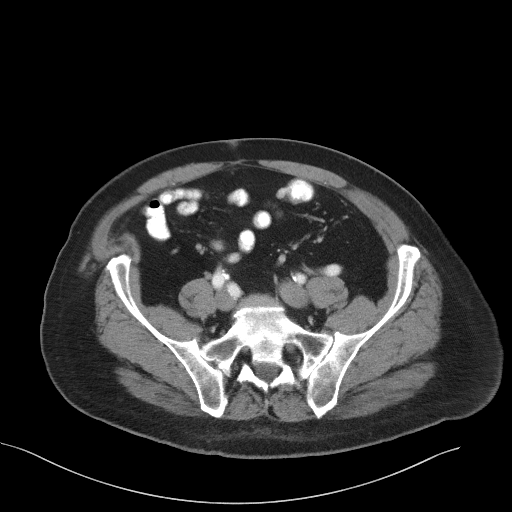
[im 39/90  soft-tissue]
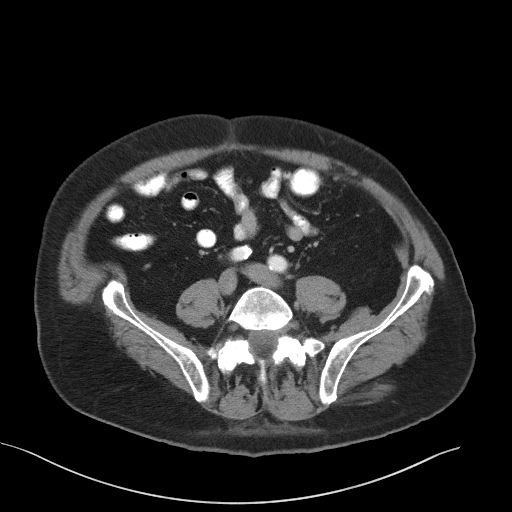
[im 45/90  soft-tissue]
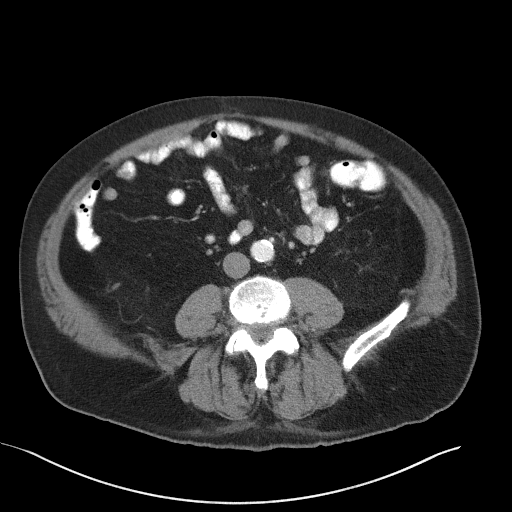
[im 51/90  soft-tissue]
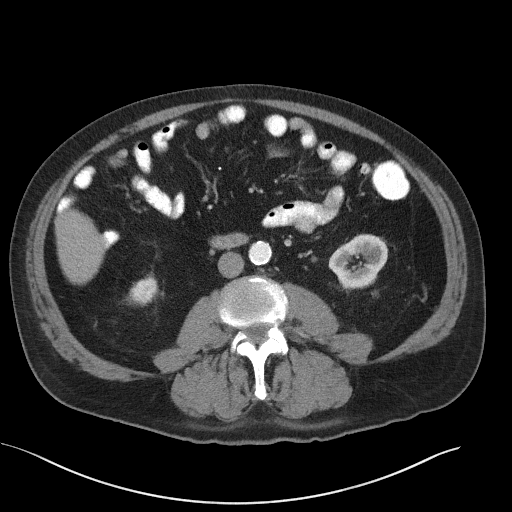
[im 56/90  soft-tissue]
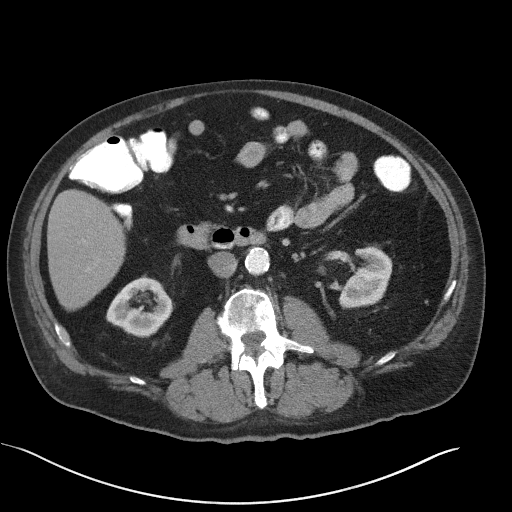
[im 56/90  bone]
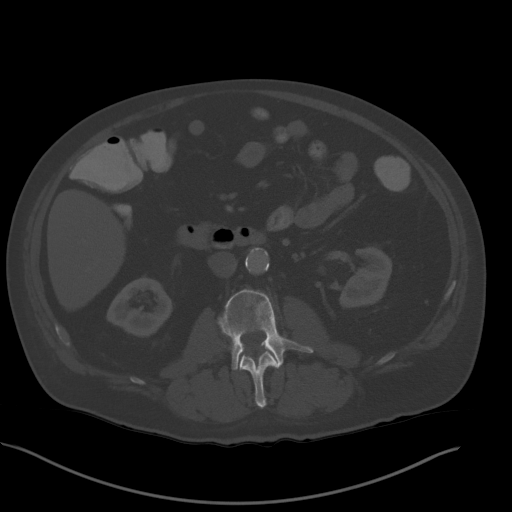
[im 62/90  soft-tissue]
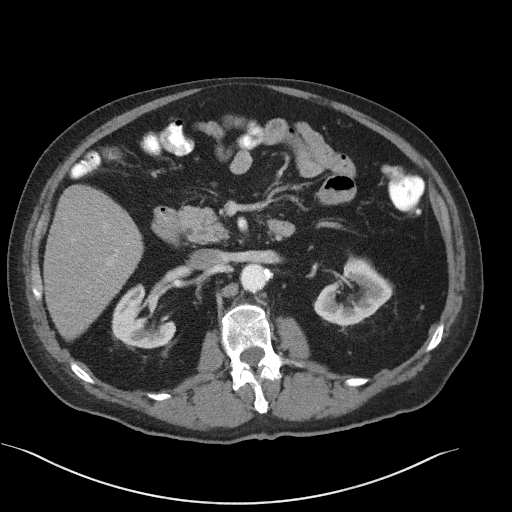
[im 73/90  soft-tissue]
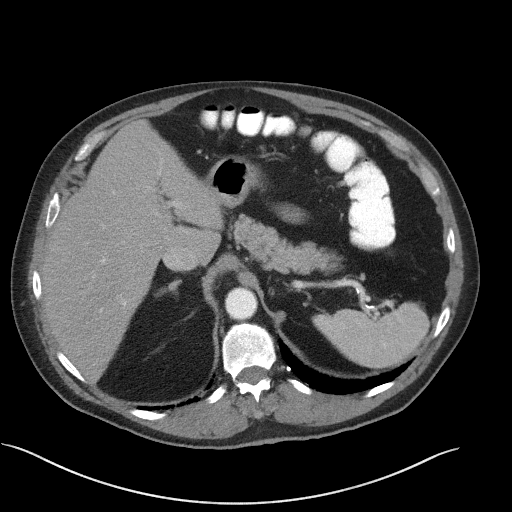
[im 78/90  soft-tissue]
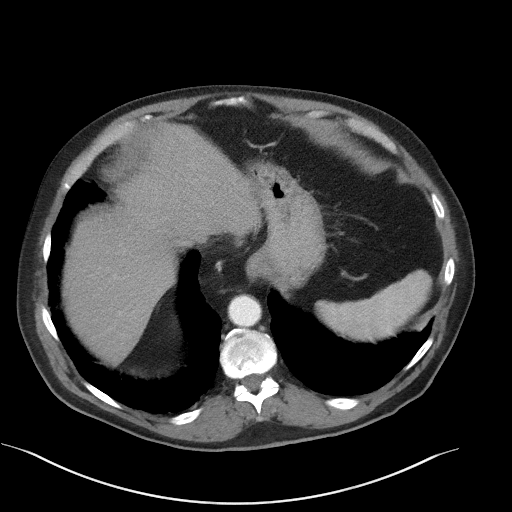
[im 84/90  soft-tissue]
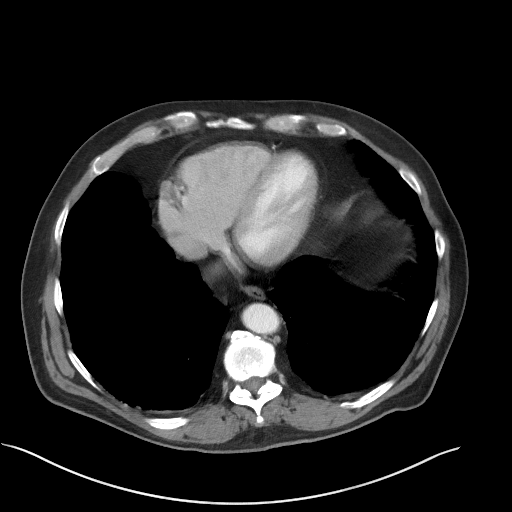

[Series 5: coronal st · coronal · 0.87mm/px · 3 of 114 slices shown]
[im 38/114  soft-tissue]
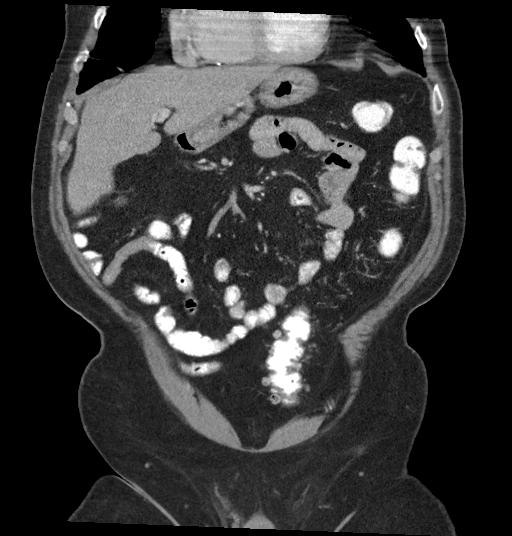
[im 51/114  soft-tissue]
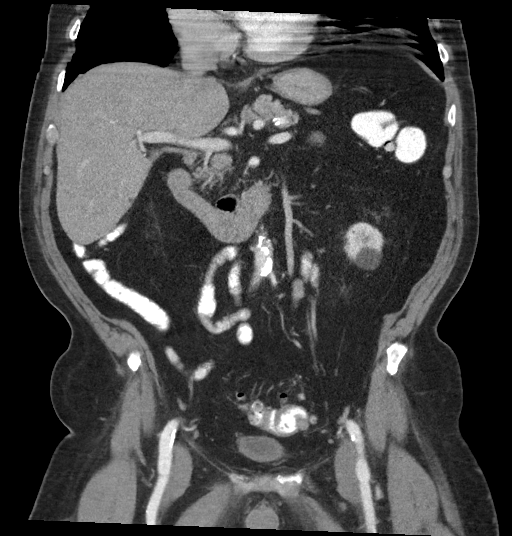
[im 63/114  soft-tissue]
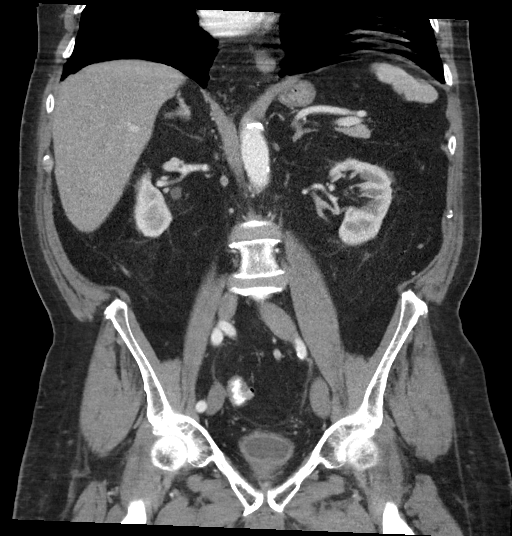

[16 of 46 positions shown; findings below may reference images not displayed]

FINDINGS: Lower chest: No acute abnormality.

Hepatobiliary: No focal liver abnormality is seen. Status post
cholecystectomy. No biliary dilatation.

Pancreas: Unremarkable. No pancreatic ductal dilatation or
surrounding inflammatory changes.

Spleen: Normal in size without focal abnormality.

Adrenals/Urinary Tract: Normal appearance of the adrenal glands.
Cyst within inferior pole of left kidney measures 2.9 cm. No
enhancing mass or hydronephrosis identified bilaterally. Urinary
bladder appears normal.

Stomach/Bowel: Stomach is unremarkable. No dilated loops of large or
small bowel. Previous right hemicolectomy. No bowel wall thickening,
inflammation or distension. Sigmoid diverticulosis without acute
inflammation.

Vascular/Lymphatic: Aortic atherosclerosis. No aneurysm. No
abdominopelvic adenopathy identified. There are 2 small lymph nodes
adjacent to the enterocolonic anastomosis, image 37/2 and image
38/2. These measure up to 5 mm and appear new when compared with the
previous exam.

Reproductive: Fiducial markers are noted within the prostate gland.

Other: No free fluid or fluid collections within the abdomen or
pelvis. Again noted is a small supraumbilical ventral abdominal wall
hernia containing fat and a knuckle of nondilated small bowel, image
64/6 and image 47/2.

Musculoskeletal: No acute or significant osseous findings. Mild
curvature of the lumbar spine is convex towards the right. Multi
level degenerative disc disease noted
IMPRESSION: 1. No acute findings identified within the abdomen or pelvis.
2. Status post right hemicolectomy. There are 2 small lymph nodes
adjacent to the enterocolonic anastomosis which measure up to 5 mm
and are new when compared with the previous exam. This is
nonspecific and likely a benign incidental finding. Consider
follow-up imaging in 3-6 months with repeat CT of the abdomen to
ensure stability.
3. Small ventral abdominal wall hernia contains fat and a knuckle of
nondilated small bowel.
4. Aortic atherosclerosis.

Aortic Atherosclerosis ([id number].D).

## 2021-08-04 ENCOUNTER — Other Ambulatory Visit: Payer: Self-pay

## 2021-08-04 ENCOUNTER — Inpatient Hospital Stay (HOSPITAL_COMMUNITY): Payer: Medicare PPO | Attending: Hematology

## 2021-08-04 ENCOUNTER — Other Ambulatory Visit (INDEPENDENT_AMBULATORY_CARE_PROVIDER_SITE_OTHER): Payer: Self-pay | Admitting: Ophthalmology

## 2021-08-04 DIAGNOSIS — Z85038 Personal history of other malignant neoplasm of large intestine: Secondary | ICD-10-CM | POA: Diagnosis not present

## 2021-08-04 DIAGNOSIS — Z8719 Personal history of other diseases of the digestive system: Secondary | ICD-10-CM | POA: Diagnosis not present

## 2021-08-04 DIAGNOSIS — Z8546 Personal history of malignant neoplasm of prostate: Secondary | ICD-10-CM | POA: Insufficient documentation

## 2021-08-04 DIAGNOSIS — Z923 Personal history of irradiation: Secondary | ICD-10-CM | POA: Diagnosis not present

## 2021-08-04 DIAGNOSIS — Z8249 Family history of ischemic heart disease and other diseases of the circulatory system: Secondary | ICD-10-CM | POA: Insufficient documentation

## 2021-08-04 DIAGNOSIS — Z9221 Personal history of antineoplastic chemotherapy: Secondary | ICD-10-CM | POA: Diagnosis not present

## 2021-08-04 DIAGNOSIS — C9110 Chronic lymphocytic leukemia of B-cell type not having achieved remission: Secondary | ICD-10-CM | POA: Diagnosis not present

## 2021-08-04 LAB — CBC WITH DIFFERENTIAL/PLATELET
Basophils Absolute: 0.5 10*3/uL — ABNORMAL HIGH (ref 0.0–0.1)
Basophils Relative: 3 %
Eosinophils Absolute: 0 10*3/uL (ref 0.0–0.5)
Eosinophils Relative: 0 %
HCT: 43.4 % (ref 39.0–52.0)
Hemoglobin: 14.5 g/dL (ref 13.0–17.0)
Lymphocytes Relative: 47 %
Lymphs Abs: 7.2 10*3/uL — ABNORMAL HIGH (ref 0.7–4.0)
MCH: 31.7 pg (ref 26.0–34.0)
MCHC: 33.4 g/dL (ref 30.0–36.0)
MCV: 95 fL (ref 80.0–100.0)
Monocytes Absolute: 1.4 10*3/uL — ABNORMAL HIGH (ref 0.1–1.0)
Monocytes Relative: 9 %
Neutro Abs: 6.3 10*3/uL (ref 1.7–7.7)
Neutrophils Relative %: 41 %
Platelets: 402 10*3/uL — ABNORMAL HIGH (ref 150–400)
RBC: 4.57 MIL/uL (ref 4.22–5.81)
RDW: 12.2 % (ref 11.5–15.5)
WBC: 15.3 10*3/uL — ABNORMAL HIGH (ref 4.0–10.5)
nRBC: 0 % (ref 0.0–0.2)

## 2021-08-04 LAB — COMPREHENSIVE METABOLIC PANEL
ALT: 20 U/L (ref 0–44)
AST: 21 U/L (ref 15–41)
Albumin: 3.7 g/dL (ref 3.5–5.0)
Alkaline Phosphatase: 59 U/L (ref 38–126)
Anion gap: 6 (ref 5–15)
BUN: 13 mg/dL (ref 8–23)
CO2: 25 mmol/L (ref 22–32)
Calcium: 8.4 mg/dL — ABNORMAL LOW (ref 8.9–10.3)
Chloride: 107 mmol/L (ref 98–111)
Creatinine, Ser: 1.03 mg/dL (ref 0.61–1.24)
GFR, Estimated: >60 mL/min (ref >60)
Glucose, Bld: 81 mg/dL (ref 70–99)
Potassium: 3.9 mmol/L (ref 3.5–5.1)
Sodium: 138 mmol/L (ref 135–145)
Total Bilirubin: 0.6 mg/dL (ref 0.3–1.2)
Total Protein: 6.4 g/dL — ABNORMAL LOW (ref 6.5–8.1)

## 2021-08-04 LAB — PSA: Prostatic Specific Antigen: 0.13 ng/mL (ref 0.00–4.00)

## 2021-08-04 LAB — VITAMIN D 25 HYDROXY (VIT D DEFICIENCY, FRACTURES): Vit D, 25-Hydroxy: 57.37 ng/mL (ref 30–100)

## 2021-08-04 LAB — LACTATE DEHYDROGENASE: LDH: 122 U/L (ref 98–192)

## 2021-08-04 LAB — FOLATE: Folate: 58.6 ng/mL (ref >5.9)

## 2021-08-04 LAB — MAGNESIUM: Magnesium: 2 mg/dL (ref 1.7–2.4)

## 2021-08-04 LAB — VITAMIN B12: Vitamin B-12: 583 pg/mL (ref 180–914)

## 2021-08-05 LAB — CEA: CEA: 2 ng/mL (ref 0.0–4.7)

## 2021-08-10 NOTE — Progress Notes (Signed)
David Pugh, David Pugh [id number]   CLINIC:  Medical Oncology/Hematology  PCP:  Leeanne Rio, MD [place] / [place] [id number] [phone]   REASON FOR VISIT:  Follow-up for CLL  PRIOR THERAPY: intermittent feraheme (last infusion 01/03/2015)  CURRENT THERAPY: surveillance  BRIEF ONCOLOGIC HISTORY:  Oncology History  Cancer of ascending colon ([place])  02/29/2012 Procedure   laparoscopic assisted R colectomy   03/09/2012 Miscellaneous   Iron deficiency and folic acid deficiency   03/29/2012 Initial Diagnosis   Cancer of ascending colon, CEA on 01/19/2012 was WNL   08/02/2013 Procedure   Ileocolonoscopy with snare polypectomy   06/11/2014 Imaging   CT abdomen/pelvis with no findings to suggest metastatic disease. Diverticulosis   06/11/2016 Imaging   CT Abdomen/Pelvis with no findings to suggest recurrent or metastatic disease. Prostatomegaly.      CANCER STAGING:  Cancer Staging  Cancer of ascending colon [facility]) Staging form: Colon and Rectum, AJCC 7th Edition - Clinical: Stage IIIB (T3, N1c, M0) - Signed by Baird Cancer, PA on 05/02/2012   INTERVAL HISTORY:  [name], a 86 y.o. male, returns for routine follow-up of his CLL. David Pugh was last seen on 01/21/2021.   Today he reports feeling good. He denies fevers, night sweats, weight loss, and infections.   REVIEW OF SYSTEMS:  Review of Systems  Constitutional:  Positive for fatigue (75%). Negative for appetite change.  All other systems reviewed and are negative.  PAST MEDICAL/SURGICAL HISTORY:  Past Medical History:  Diagnosis Date   Abnormality of gait 12/18/2013   Arthritis    Bilateral foot-drop 04/02/2019   Biliary dyskinesia    CAD (coronary artery disease) 2012   a. DES x2 in ramus/OM and mid LAD in 2012  b. balloon angioplaty of OM 2015   CLL (chronic lymphocytic leukemia) ([place])    Colon cancer ([place]) 2013   Right hemicolectomy, did not  tolerate chemotherapy   Colon polyps    tubular adenomas   Diverticula of colon 2010   Essential hypertension    Exposure to Agent Orange    Folic acid deficiency    Gastroparesis 2014   GERD (gastroesophageal reflux disease)    Glaucoma    Heart palpitations 2014   Hiatal hernia 2010   History of anemia as a child    History of kidney stones    Iron deficiency 2013   Myocardial infarction [facility]) 2012   Peripheral neuropathy    Prostate cancer ([place]) 2010   Radiation, surgery   PTSD (post-traumatic stress disorder)    Radiation proctitis    Rosacea    Past Surgical History:  Procedure Laterality Date   CARDIAC CATHETERIZATION  02/13/2011   [facility], [facility] cardiology   CATARACT EXTRACTION     2011   CATARACT EXTRACTION W/PHACO Right 08/06/2013   Procedure: CATARACT EXTRACTION PHACO AND INTRAOCULAR LENS PLACEMENT ([place]);  Surgeon: Tonny Branch, MD;  Location: [facility]S;  Service: Ophthalmology;  Laterality: Right;  CDE:20.96   CHOLECYSTECTOMY     COLONOSCOPY  02/12/2004   Dr. Gala Romney- L side diverticula, inflammatory  colon polyps   COLONOSCOPY  01/19/2012   RMR: Prominent changes involving the rectal mucosa consistent with radiation-induced proctitis. Multiple colonic  polyps removed as described above. Sigmoid Diverticulosis/ 1.5 x 2 cm relatively flat ulcerated lesion in cecum/path showed adenocarcinoma of the colon. descending colon polyp with tubular adenoma and one fragment of focal high grade dysplasia.  COLONOSCOPY  08/28/2012   IRJ:JOACZYSAY proctitis. Colonic diverticulosis/Ulcerations at the surgical anastomosis  path: ulcerated colonic mucosa with prolapse changes, tubular adenoma.    COLONOSCOPY N/A 08/02/2013   RMR: Colonic polyps -removed as described above. Colonic diverticulosis. Status post reight hemicolectomy. Radiation proctitis- asymptomatic.    COLONOSCOPY N/A 05/09/2015   Procedure: COLONOSCOPY;  Surgeon: Daneil Dolin, MD;  Location: [facility];  Service:  Endoscopy;  Laterality: N/A;  1200-moved to 1145 [place] to notify pt   COLONOSCOPY WITH PROPOFOL N/A 11/10/2017   Procedure: COLONOSCOPY WITH PROPOFOL;  Surgeon: Daneil Dolin, MD;  Location: [facility];  Service: Endoscopy;  Laterality: N/A;   CORONARY ANGIOPLASTY  06/17/14   OM1 POBA   CORONARY STENT PLACEMENT  01/2011   2.5x66mm Xience drug-eluting stent in ramus intermedius/OMI vessel, 2.5x56mm Promus Element stent in mid LAD artery   ESOPHAGOGASTRODUODENOSCOPY  10/10/2008   Dr. Tilda Burrow hiatal hernia, normal esophagus, normal stomach   ESOPHAGOGASTRODUODENOSCOPY  02/12/2004   Dr. Dudley Major- normal    ESOPHAGOGASTRODUODENOSCOPY N/A 05/09/2015   Procedure: ESOPHAGOGASTRODUODENOSCOPY (EGD);  Surgeon: Daneil Dolin, MD;  [facility];  Service: Endoscopy;  Laterality: N/A;   ESOPHAGOGASTRODUODENOSCOPY (EGD) WITH PROPOFOL N/A 11/10/2017   Procedure: ESOPHAGOGASTRODUODENOSCOPY (EGD) WITH PROPOFOL;  Surgeon: Daneil Dolin, MD;  [facility];  Service: Endoscopy;  Laterality: N/A;  9:45am   GIVENS CAPSULE STUDY N/A 06/03/2015   Procedure: GIVENS CAPSULE STUDY;  Surgeon: Daneil Dolin, MD;  [facility]: [facility];  Service: Endoscopy;  Laterality: N/A;  0700   HOT HEMOSTASIS  11/10/2017   Procedure: HOT HEMOSTASIS (ARGON PLASMA COAGULATION/BICAP);  Surgeon: Daneil Dolin, MD;  [facility];  Service: Endoscopy;;  rectum   KNEE SURGERY     left knee    LEFT HEART CATHETERIZATION WITH CORONARY ANGIOGRAM N/A 06/17/2014   Procedure: LEFT HEART CATHETERIZATION WITH CORONARY ANGIOGRAM;  Surgeon: Burnell Blanks, MD;  Location: [facility] CATH LAB;  Service: Cardiovascular;  Laterality: N/A;   PARTIAL COLECTOMY  02/29/2012   Procedure: PARTIAL COLECTOMY;  Surgeon: Adin Hector, MD;  Location: [facility];  Service: General;  Laterality: Right;   POLYPECTOMY  11/10/2017   Procedure: POLYPECTOMY;  Surgeon: Daneil Dolin, MD;  [facility];  Service: Endoscopy;;   gastric colon   PROSTATECTOMY     TONSILLECTOMY     TRANSURETHRAL RESECTION OF PROSTATE     VASECTOMY      SOCIAL HISTORY:  Social History   Socioeconomic History   Marital status: Married    Spouse name: Not on file   Number of children: 3   Years of education: military   Highest education level: Not on file  Occupational History   Occupation: Retired    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  Tobacco Use   Smoking status: Never   Smokeless tobacco: Never  Vaping Use   Vaping Use: Never used  Substance and Sexual Activity   Alcohol use: Yes    Comment: 1 drink every 6 months   Drug use: No   Sexual activity: Not Currently  Other Topics Concern   Not on file  Social History Narrative   Right handed   Caffeine~1 cup per day    Lives at home with wife    Social Determinants of Health   Financial Resource Strain: Not on file  Food Insecurity: Not on file  Transportation Needs: Not on file  Physical Activity: Not on file  Stress: Not on file  Social Connections: Not on file  Intimate Partner Violence: Not on file    FAMILY HISTORY:  Family History  Problem Relation Age of Onset   Throat cancer Father        dx in his 24s and again in his 45s; pipe and cigar smoker   Neuropathy Brother    Prostate cancer Brother 66   Melanoma Brother        dx in his 66s   Cancer Paternal Uncle        smoking related cancer   Heart attack Paternal Grandfather    Stomach cancer Cousin 71   Colon cancer Neg Hx     CURRENT MEDICATIONS:  Current Outpatient Medications  Medication Sig Dispense Refill   ALPRAZolam (XANAX) 0.5 MG tablet Take 0.5 mg by mouth daily as needed for anxiety.     Apoaequorin (PREVAGEN EXTRA STRENGTH) 20 MG CAPS Take 1 capsule by mouth daily.     aspirin EC 81 MG tablet Take 81 mg by mouth daily.     B Complex Vitamins (VITAMIN B COMPLEX PO) Take 1 tablet by mouth at bedtime.      benzonatate (TESSALON) 100 MG capsule Take 1 capsule by mouth every 8 (eight) hours for  cough. 21 capsule 0   cholecalciferol (VITAMIN D3) 25 MCG (1000 UNIT) tablet Take 1,000 Units by mouth 2 (two) times daily.     citalopram (CELEXA) 20 MG tablet Take 10 mg by mouth daily.      clopidogrel (PLAVIX) 75 MG tablet TAKE 1 TABLET BY MOUTH ONCE DAILY. 30 tablet 6   fluticasone (FLONASE) 50 MCG/ACT nasal spray Place 1 spray into both nostrils at bedtime.     fluticasone (FLONASE) 50 MCG/ACT nasal spray 1 SPRAY INTO EACH NOSTRIL DAILY.     folic acid (FOLVITE) 1 MG tablet TAKE (1) TABLET BY MOUTH ONCE DAILY . (Patient taking differently: Take 1 mg by mouth daily.) 30 tablet 0   ibuprofen (ADVIL,MOTRIN) 200 MG tablet Take 400 mg by mouth daily as needed for headache or moderate pain.     latanoprost (XALATAN) 0.005 % ophthalmic solution 1 DROP IN EACH EYE AT BEDTIME. 2.5 mL 12   loperamide (IMODIUM) 2 MG capsule Take 1 capsule (2 mg total) by mouth as needed for diarrhea or loose stools (maximum 16 mg per day). 30 capsule 0   losartan (COZAAR) 50 MG tablet Take 25 mg by mouth 2 (two) times daily.      losartan (COZAAR) 50 MG tablet Take 1 tablet by mouth daily.     metoprolol (LOPRESSOR) 50 MG tablet Take 50 mg by mouth 2 (two) times daily.     montelukast (SINGULAIR) 10 MG tablet Take 10 mg by mouth at bedtime.     Multiple Vitamin (MULTIVITAMIN WITH MINERALS) TABS tablet Take 1 tablet by mouth daily.     nitroGLYCERIN (NITROSTAT) 0.4 MG SL tablet PLACE ONE (1) TABLET UNDER TONGUE EVERY 5 MINUTES UP TO (3) DOSES AS NEEDED FOR CHEST PAIN. 25 tablet 3   ondansetron (ZOFRAN) 4 MG tablet Take 1 tablet (4 mg total) by mouth every 6 (six) hours as needed for nausea or vomiting. 20 tablet 0   pantoprazole (PROTONIX) 40 MG tablet TAKE (1) TABLET TWICE DAILY. 60 tablet 5   pravastatin (PRAVACHOL) 40 MG tablet TAKE 1 TABLET BY MOUTH AT BEDTIME. 30 tablet 0   vitamin B-12 (CYANOCOBALAMIN) 1000 MCG tablet Take by mouth daily.      No current facility-administered medications for  this visit.     ALLERGIES:  Allergies  Allergen Reactions   Shellfish Allergy Anaphylaxis   Sulfonamide Derivatives Diarrhea and Nausea Only    PHYSICAL EXAM:  Performance status (ECOG): 1 - Symptomatic but completely ambulatory  Vitals:   08/11/21 1518  BP: (!) 159/80  Pulse: (!) 57  Resp: 18  Temp: 97.9 F (36.6 C)  SpO2: 98%   Wt Readings from Last 3 Encounters:  08/11/21 217 lb (98.4 kg)  05/12/21 214 lb 12.8 oz (97.4 kg)  05/01/21 215 lb (97.5 kg)   Physical Exam Vitals reviewed.  Constitutional:      Appearance: Normal appearance.  Cardiovascular:     Rate and Rhythm: Normal rate and regular rhythm.     Pulses: Normal pulses.     Heart sounds: Normal heart sounds.  Pulmonary:     Effort: Pulmonary effort is normal.     Breath sounds: Normal breath sounds.  Abdominal:     Palpations: Abdomen is soft. There is no hepatomegaly, splenomegaly or mass.     Tenderness: There is no abdominal tenderness.  Lymphadenopathy:     Cervical: No cervical adenopathy.     Right cervical: No superficial cervical adenopathy.    Left cervical: No superficial cervical adenopathy.     Upper Body:     Right upper body: No supraclavicular, axillary or pectoral adenopathy.     Left upper body: No supraclavicular, axillary or pectoral adenopathy.     Lower Body: No right inguinal adenopathy. No left inguinal adenopathy.  Neurological:     General: No focal deficit present.     Mental Status: He is alert and oriented to person, place, and time.  Psychiatric:        Mood and Affect: Mood normal.        Behavior: Behavior normal.     LABORATORY DATA:  I have reviewed the labs as listed.  CBC Latest Ref Rng & Units 08/04/2021 05/01/2021 01/13/2021  WBC 4.0 - 10.5 K/uL 15.3(H) 14.0(H) 15.0(H)  Hemoglobin 13.0 - 17.0 g/dL 14.5 14.2 15.0  Hematocrit 39.0 - 52.0 % 43.4 43.0 46.1  Platelets 150 - 400 K/uL 402(H) 326 315   CMP Latest Ref Rng & Units 08/04/2021 05/01/2021 01/13/2021  Glucose 70 - 99  mg/dL 81 104(H) 105(H)  BUN 8 - 23 mg/dL 13 21 15   Creatinine 0.61 - 1.24 mg/dL 1.03 1.11 1.09  Sodium 135 - 145 mmol/L 138 141 140  Potassium 3.5 - 5.1 mmol/L 3.9 4.1 4.3  Chloride 98 - 111 mmol/L 107 109 107  CO2 22 - 32 mmol/L 25 25 26   Calcium 8.9 - 10.3 mg/dL 8.4(L) 8.5(L) 9.1  Total Protein 6.5 - 8.1 g/dL 6.4(L) 6.4(L) 7.0  Total Bilirubin 0.3 - 1.2 mg/dL 0.6 0.9 1.2  Alkaline Phos 38 - 126 U/L 59 54 50  AST 15 - 41 U/L 21 22 21   ALT 0 - 44 U/L 20 19 17     DIAGNOSTIC IMAGING:  I have independently reviewed the scans and discussed with the patient. No results found.   ASSESSMENT:  1.  Stage 0 CLL: - He was diagnosed by flow on 12/13/2014   2.  Stage III ascending colon cancer: - He had 2 out of 20 lymph nodes positive. -Status post right hemicolectomy on 02/29/2012, could not tolerate adjuvant Xeloda. - EGD showed multiple benign gastric polyps, otherwise within normal limits. -He is completed 5 years of surveillance.  No more scans needed at this time. -He had  a colonoscopy on 11/10/2017 which showed radiation proctitis changes.  Diverticulosis in the sigmoid colon.  One polyp in the ascending colon which was removed     3.  Prostate cancer: -Underwent radiation therapy at [facility] in 2011. -He also underwent a TURP x2. - He follows up with Dr. Nevada Crane his urologist in [facility].   PLAN:  1.  Stage 0 CLL: - He does not have any fevers, night sweats or weight loss.  No infections or hospitalizations. - Physical examination did not reveal any palpable adenopathy or splenomegaly. - Reviewed labs from 08/04/2021.  White count is 15.3 with elevated lymphocytes.  Hemoglobin was normal.  Platelet count is 402.  LFTs are normal.  LDH was normal. - No clear indications for treatment at this time.  RTC 6 months for follow-up with labs and physical exam.   2.  Stage III ascending colon cancer: - No change in bowel habits.  No bleeding per rectum. - Reviewed CEA level  2.0.   3.  Prostate cancer: - Latest PSA 0.13.  He remains in remission.     Orders placed this encounter:  No orders of the defined types were placed in this encounter.    Derek Jack, MD [place] [phone]   I, Thana Ates, am acting as a scribe for Dr. Derek Jack.  I, Derek Jack MD, have reviewed the above documentation for accuracy and completeness, and I agree with the above.

## 2021-08-11 ENCOUNTER — Other Ambulatory Visit: Payer: Self-pay

## 2021-08-11 ENCOUNTER — Encounter (HOSPITAL_COMMUNITY): Payer: Self-pay | Admitting: Hematology

## 2021-08-11 ENCOUNTER — Inpatient Hospital Stay (HOSPITAL_BASED_OUTPATIENT_CLINIC_OR_DEPARTMENT_OTHER): Payer: Medicare PPO | Admitting: Hematology

## 2021-08-11 VITALS — BP 159/80 | HR 57 | Temp 97.9°F | Resp 18 | Ht 71.0 in | Wt 217.0 lb

## 2021-08-11 DIAGNOSIS — C9110 Chronic lymphocytic leukemia of B-cell type not having achieved remission: Secondary | ICD-10-CM

## 2021-08-11 NOTE — Patient Instructions (Addendum)
[place] at [facility] Discharge Instructions  You were seen and examined today by Dr. Delton Coombes.   Your PSA and CEA are normal. Dr. Delton Coombes will check your PSA in 6 months and CEA in 1 year.  Follow-up as scheduled.   Thank you for choosing [place] at [facility] to provide your oncology and hematology care.  To afford each patient quality time with our provider, please arrive at least 15 minutes before your scheduled appointment time.   If you have a lab appointment with the [facility] please come in thru the Main Entrance and check in at the main information desk.  You need to re-schedule your appointment should you arrive 10 or more minutes late.  We strive to give you quality time with our providers, and arriving late affects you and other patients whose appointments are after yours.  Also, if you no show three or more times for appointments you may be dismissed from the clinic at the providers discretion.     Again, thank you for choosing [facility].  Our hope is that these requests will decrease the amount of time that you wait before being seen by our physicians.       _____________________________________________________________  Should you have questions after your visit to [facility], please contact our office at [phone] and follow the prompts.  Our office hours are 8:00 a.m. and 4:30 p.m. Monday - Friday.  Please note that voicemails left after 4:00 p.m. may not be returned until the following business day.  We are closed weekends and major holidays.  You do have access to a nurse 24-7, just call the main number to the clinic [phone] and do not press any options, hold on the line and a nurse will answer the phone.    For prescription refill requests, have your pharmacy contact our office and allow 72 hours.    Due to Covid, you will need to wear a mask upon entering the hospital. If  you do not have a mask, a mask will be given to you at the Main Entrance upon arrival. For doctor visits, patients may have 1 support person age 19 or older with them. For treatment visits, patients can not have anyone with them due to social distancing guidelines and our immunocompromised population.

## 2021-08-18 ENCOUNTER — Telehealth: Payer: Self-pay | Admitting: Cardiology

## 2021-08-18 NOTE — Telephone Encounter (Signed)
Reports home BP monitor has not been checked for accuracy Says he purchased it at his drug store and changed batteries Denies chest pain, dizziness, lightheadedness, fatigue or SOB Say she feels fine and is having no problems Advised to contact his family doctor to arrange nurse visit to get his monitor checked for accuracy and call our office back with an update Verbalized understanding plan

## 2021-08-18 NOTE — Telephone Encounter (Signed)
Pt c/o BP issue: STAT if pt c/o blurred vision, one-sided weakness or slurred speech  1. What are your last 5 BP readings? 50/80,  57/78  2. Are you having any other symptoms (ex. Dizziness, headache, blurred vision, passed out)? No symptoms  3. What is your BP issue? Low blood pressure

## 2021-10-14 ENCOUNTER — Other Ambulatory Visit: Payer: Self-pay | Admitting: Cardiology

## 2021-11-02 ENCOUNTER — Encounter (HOSPITAL_COMMUNITY): Payer: Self-pay | Admitting: Hematology & Oncology

## 2021-11-11 ENCOUNTER — Encounter: Payer: Self-pay | Admitting: Cardiology

## 2021-11-11 ENCOUNTER — Ambulatory Visit (INDEPENDENT_AMBULATORY_CARE_PROVIDER_SITE_OTHER): Payer: Medicare PPO | Admitting: Cardiology

## 2021-11-11 VITALS — BP 118/64 | HR 57 | Ht 70.5 in | Wt 213.0 lb

## 2021-11-11 DIAGNOSIS — I25119 Atherosclerotic heart disease of native coronary artery with unspecified angina pectoris: Secondary | ICD-10-CM | POA: Diagnosis not present

## 2021-11-11 DIAGNOSIS — E782 Mixed hyperlipidemia: Secondary | ICD-10-CM | POA: Diagnosis not present

## 2021-11-11 NOTE — Patient Instructions (Signed)

## 2021-11-11 NOTE — Progress Notes (Signed)
? ? ?Cardiology Office Note ? ?Date: 11/11/2021  ? ?ID: David Ades., DOB 24-Nov-1934, MRN [id number] ? ?PCP:  Leeanne Rio, MD  ?Cardiologist:  Rozann Lesches, MD ?Electrophysiologist:  None  ? ?Chief Complaint  ?Patient presents with  ? Cardiac follow-up  ? ? ?History of Present Illness: ?David Gracy. is an 87 y.o. male last seen in September 2022 by Mr. Leonides Sake NP.  He is here for a routine visit.  He does not describe any active angina symptoms with typical activities, NYHA class II dyspnea.  He exercises 5 days a week using an elliptical machine for 25 minutes in the mornings. ? ?I reviewed his medications which are outlined below and stable from a cardiac perspective.  He does not report any nitroglycerin use. ? ?He had lab work with PCP recently, LDL was 56. ? ?Past Medical History:  ?Diagnosis Date  ? Abnormality of gait 12/18/2013  ? Arthritis   ? Bilateral foot-drop 04/02/2019  ? Biliary dyskinesia   ? CAD (coronary artery disease) 2012  ? a. DES x2 in ramus/OM and mid LAD in 2012  b. balloon angioplaty of OM 2015  ? CLL (chronic lymphocytic leukemia) ([place])   ? Colon cancer [facility]) 2013  ? Right hemicolectomy, did not tolerate chemotherapy  ? Colon polyps   ? tubular adenomas  ? Diverticula of colon 2010  ? Essential hypertension   ? Exposure to [employer]   ? Folic acid deficiency   ? Gastroparesis 2014  ? GERD (gastroesophageal reflux disease)   ? Glaucoma   ? Heart palpitations 2014  ? Hiatal hernia 2010  ? History of anemia as a child   ? History of kidney stones   ? Iron deficiency 2013  ? Myocardial infarction [facility]) 2012  ? Peripheral neuropathy   ? Prostate cancer ([place]) 2010  ? Radiation, surgery  ? PTSD (post-traumatic stress disorder)   ? Radiation proctitis   ? Rosacea   ? ? ?Past Surgical History:  ?Procedure Laterality Date  ? CARDIAC CATHETERIZATION  02/13/2011  ? [facility], [facility] cardiology  ? CATARACT EXTRACTION    ? 2011  ? CATARACT EXTRACTION W/PHACO Right 08/06/2013  ?  Procedure: CATARACT EXTRACTION PHACO AND INTRAOCULAR LENS PLACEMENT (IOC);  Surgeon: Tonny Branch, MD;  Location: [facility];  Service: Ophthalmology;  Laterality: Right;  CDE:20.96  ? CHOLECYSTECTOMY    ? COLONOSCOPY  02/12/2004  ? Dr. Gala Romney- L side diverticula, inflammatory  colon polyps  ? COLONOSCOPY  01/19/2012  ? RMR: Prominent changes involving the rectal mucosa consistent with radiation-induced proctitis. Multiple colonic  polyps removed as described above. Sigmoid Diverticulosis/ 1.5 x 2 cm relatively flat ulcerated lesion in cecum/path showed adenocarcinoma of the colon. descending colon polyp with tubular adenoma and one fragment of focal high grade dysplasia.   ? COLONOSCOPY  08/28/2012  ? [id number] proctitis. Colonic diverticulosis/Ulcerations at the surgical anastomosis  path: ulcerated colonic mucosa with prolapse changes, tubular adenoma.   ? COLONOSCOPY N/A 08/02/2013  ? RMR: Colonic polyps -removed as described above. Colonic diverticulosis. Status post reight hemicolectomy. Radiation proctitis- asymptomatic.   ? COLONOSCOPY N/A 05/09/2015  ? Procedure: COLONOSCOPY;  Surgeon: Daneil Dolin, MD;  [facility];  Service: Endoscopy;  Laterality: N/A;  1200-moved to [address]5 [address] to notify pt  ? COLONOSCOPY WITH PROPOFOL N/A 11/10/2017  ? Procedure: COLONOSCOPY WITH PROPOFOL;  Surgeon: Daneil Dolin, MD;  [facility];  Service: Endoscopy;  Laterality: N/A;  ? CORONARY  ANGIOPLASTY  06/17/14  ? OM1 POBA  ? CORONARY STENT PLACEMENT  01/2011  ? 2.5x83m Xience drug-eluting stent in ramus intermedius/OMI vessel, 2.5x668mPromus Element stent in mid LAD artery  ? ESOPHAGOGASTRODUODENOSCOPY  10/10/2008  ? Dr. RoTilda Burrowiatal hernia, normal esophagus, normal stomach  ? ESOPHAGOGASTRODUODENOSCOPY  02/12/2004  ? Dr. RoDudley Majornormal   ? ESOPHAGOGASTRODUODENOSCOPY N/A 05/09/2015  ? Procedure: ESOPHAGOGASTRODUODENOSCOPY (EGD);  Surgeon: RoDaneil DolinMD;  [facility];  Service: Endoscopy;   Laterality: N/A;  ? ESOPHAGOGASTRODUODENOSCOPY (EGD) WITH PROPOFOL N/A 11/10/2017  ? Procedure: ESOPHAGOGASTRODUODENOSCOPY (EGD) WITH PROPOFOL;  Surgeon: RoDaneil DolinMD;  [facility];  Service: Endoscopy;  Laterality: N/A;  9:45am  ? GIVENS CAPSULE STUDY N/A 06/03/2015  ? Procedure: GIVENS CAPSULE STUDY;  Surgeon: RoDaneil DolinMD;  [facility];  Service: Endoscopy;  Laterality: N/A;  0700  ? HOT HEMOSTASIS  11/10/2017  ? Procedure: HOT HEMOSTASIS (ARGON PLASMA COAGULATION/BICAP);  Surgeon: RoDaneil DolinMD;  [facility];  Service: Endoscopy;;  rectum  ? KNEE SURGERY    ? left knee   ? LEFT HEART CATHETERIZATION WITH CORONARY ANGIOGRAM N/A 06/17/2014  ? Procedure: LEFT HEART CATHETERIZATION WITH CORONARY ANGIOGRAM;  Surgeon: ChBurnell BlanksMD;  [facility];  Service: Cardiovascular;  Laterality: N/A;  ? PARTIAL COLECTOMY  02/29/2012  ? Procedure: PARTIAL COLECTOMY;  Surgeon: HaAdin HectorMD;  [facility];  Service: General;  Laterality: Right;  ? POLYPECTOMY  11/10/2017  ? Procedure: POLYPECTOMY;  Surgeon: RoDaneil DolinMD;  [facility];  Service: Endoscopy;;  gastric ?colon  ? PROSTATECTOMY    ? TONSILLECTOMY    ? TRANSURETHRAL RESECTION OF PROSTATE    ? VASECTOMY    ? ? ?Current Outpatient Medications  ?Medication Sig Dispense Refill  ? ALPRAZolam (XANAX) 0.5 MG tablet Take 0.5 mg by mouth daily as needed for anxiety.    ? Apoaequorin (PREVAGEN EXTRA STRENGTH) 20 MG CAPS Take 1 capsule by mouth daily.    ? aspirin EC 81 MG tablet Take 81 mg by mouth daily.    ? B Complex Vitamins (VITAMIN B COMPLEX PO) Take 1 tablet by mouth at bedtime.     ? benzonatate (TESSALON) 100 MG capsule Take 1 capsule by mouth every 8 (eight) hours for cough. 21 capsule 0  ? cholecalciferol (VITAMIN D3) 25 MCG (1000 UNIT) tablet Take 1,000 Units by mouth 2 (two) times daily.    ? citalopram (CELEXA) 20 MG tablet Take 10 mg by mouth daily.     ?  clopidogrel (PLAVIX) 75 MG tablet TAKE 1 TABLET BY MOUTH ONCE DAILY. 30 tablet 6  ? fluticasone (FLONASE) 50 MCG/ACT nasal spray Place 1 spray into both nostrils at bedtime.    ? folic acid (FOLVITE) 1 MG tablet TAKE (1) TABLET BY MOUTH ONCE DAILY . (Patient taking differently: Take 1 mg by mouth daily.) 30 tablet 0  ? ibuprofen (ADVIL,MOTRIN) 200 MG tablet Take 400 mg by mouth daily as needed for headache or moderate pain.    ? latanoprost (XALATAN) 0.005 % ophthalmic solution 1 [facility] IN [facility] AT BEDTIME. 2.5 mL 12  ? loperamide (IMODIUM) 2 MG capsule Take 1 capsule (2 mg total) by mouth as needed for diarrhea or loose stools (maximum 16 mg per day). 30 capsule 0  ? losartan (COZAAR) 50 MG tablet Take 25 mg by mouth 2 (two) times daily.     ? metoprolol (  LOPRESSOR) 50 MG tablet Take 50 mg by mouth 2 (two) times daily.    ? montelukast (SINGULAIR) 10 MG tablet Take 10 mg by mouth at bedtime.    ? Multiple Vitamin (MULTIVITAMIN WITH MINERALS) TABS tablet Take 1 tablet by mouth daily.    ? nitroGLYCERIN (NITROSTAT) 0.4 MG SL tablet PLACE ONE (1) TABLET UNDER TONGUE EVERY 5 MINUTES UP TO (3) DOSES AS NEEDED FOR CHEST PAIN. 25 tablet 3  ? ondansetron (ZOFRAN) 4 MG tablet Take 1 tablet (4 mg total) by mouth every 6 (six) hours as needed for nausea or vomiting. 20 tablet 0  ? pantoprazole (PROTONIX) 40 MG tablet TAKE (1) TABLET TWICE DAILY. 60 tablet 5  ? pravastatin (PRAVACHOL) 40 MG tablet TAKE 1 TABLET BY MOUTH AT BEDTIME. 30 tablet 6  ? vitamin B-12 (CYANOCOBALAMIN) 1000 MCG tablet Take by mouth daily.     ? ?No current facility-administered medications for this visit.  ? ?Allergies:  Shellfish allergy and Sulfonamide derivatives  ? ?ROS: No palpitations or syncope. ? ?Physical Exam: ?VS:  BP 118/64   Pulse (!) 57   Ht 5' 10.5" (1.791 m)   Wt 213 lb (96.6 kg)   SpO2 97%   BMI 30.13 kg/m? , BMI Body mass index is 30.13 kg/m?. ? ?Wt Readings from Last 3 Encounters:  ?11/11/21 213 lb (96.6 kg)  ?08/11/21 217 lb  (98.4 kg)  ?05/12/21 214 lb 12.8 oz (97.4 kg)  ?  ?General: Patient appears comfortable at rest. ?HEENT: Conjunctiva and lids normal, wearing a mask. ?Neck: Supple, no elevated JVP or carotid bruits, no thyromegaly. ?Lungs

## 2021-11-15 ENCOUNTER — Other Ambulatory Visit: Payer: Self-pay | Admitting: Cardiology

## 2021-11-23 ENCOUNTER — Ambulatory Visit: Payer: TRICARE For Life (TFL) | Admitting: Neurology

## 2021-11-24 ENCOUNTER — Encounter: Payer: Self-pay | Admitting: Neurology

## 2021-11-24 ENCOUNTER — Ambulatory Visit (INDEPENDENT_AMBULATORY_CARE_PROVIDER_SITE_OTHER): Payer: Medicare PPO | Admitting: Neurology

## 2021-11-24 ENCOUNTER — Other Ambulatory Visit: Payer: Self-pay

## 2021-11-24 VITALS — BP 156/88 | HR 56 | Ht 70.0 in | Wt 210.0 lb

## 2021-11-24 DIAGNOSIS — R4189 Other symptoms and signs involving cognitive functions and awareness: Secondary | ICD-10-CM

## 2021-11-24 DIAGNOSIS — G609 Hereditary and idiopathic neuropathy, unspecified: Secondary | ICD-10-CM | POA: Diagnosis not present

## 2021-11-24 DIAGNOSIS — R269 Unspecified abnormalities of gait and mobility: Secondary | ICD-10-CM

## 2021-11-24 MED ORDER — MEMANTINE HCL 5 MG PO TABS: 5.0000 mg | ORAL_TABLET | Freq: Two times a day (BID) | ORAL | 11 refills | Status: DC

## 2021-11-24 NOTE — Patient Instructions (Addendum)
Start with Namenda 5 mg twice daily  ?Continue your other medications  ?Make sure you obtain your hearing aids from the [place]  ?Use cane with ambulation  ?Return in 1 year or sooner if worse  ? ?
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
## 2021-11-26 ENCOUNTER — Other Ambulatory Visit: Payer: Self-pay | Admitting: Cardiology

## 2022-01-01 ENCOUNTER — Other Ambulatory Visit: Payer: Self-pay | Admitting: Cardiology

## 2022-01-26 ENCOUNTER — Encounter (INDEPENDENT_AMBULATORY_CARE_PROVIDER_SITE_OTHER): Payer: Medicare PPO | Admitting: Ophthalmology

## 2022-02-02 ENCOUNTER — Encounter (INDEPENDENT_AMBULATORY_CARE_PROVIDER_SITE_OTHER): Payer: Medicare PPO | Admitting: Ophthalmology

## 2022-02-09 ENCOUNTER — Other Ambulatory Visit (HOSPITAL_COMMUNITY): Payer: Medicare PPO

## 2022-02-16 ENCOUNTER — Encounter (INDEPENDENT_AMBULATORY_CARE_PROVIDER_SITE_OTHER): Payer: Self-pay | Admitting: Ophthalmology

## 2022-02-16 ENCOUNTER — Ambulatory Visit (INDEPENDENT_AMBULATORY_CARE_PROVIDER_SITE_OTHER): Payer: Medicare PPO | Admitting: Ophthalmology

## 2022-02-16 ENCOUNTER — Ambulatory Visit (HOSPITAL_COMMUNITY): Payer: Medicare PPO | Admitting: Hematology

## 2022-02-16 DIAGNOSIS — H353111 Nonexudative age-related macular degeneration, right eye, early dry stage: Secondary | ICD-10-CM | POA: Diagnosis not present

## 2022-02-16 DIAGNOSIS — H401131 Primary open-angle glaucoma, bilateral, mild stage: Secondary | ICD-10-CM

## 2022-02-16 DIAGNOSIS — H348122 Central retinal vein occlusion, left eye, stable: Secondary | ICD-10-CM

## 2022-02-16 DIAGNOSIS — H353124 Nonexudative age-related macular degeneration, left eye, advanced atrophic with subfoveal involvement: Secondary | ICD-10-CM | POA: Diagnosis not present

## 2022-02-16 DIAGNOSIS — H35371 Puckering of macula, right eye: Secondary | ICD-10-CM

## 2022-02-16 MED ORDER — LATANOPROST 0.005 % OP SOLN: OPHTHALMIC | 12 refills | Status: AC

## 2022-02-16 NOTE — Assessment & Plan Note (Signed)
Minor OD, not clinically impactful

## 2022-02-16 NOTE — Assessment & Plan Note (Signed)
Minor OD 

## 2022-02-16 NOTE — Assessment & Plan Note (Signed)
Continue on topical glaucoma medications for protection long-term

## 2022-02-16 NOTE — Progress Notes (Signed)
02/16/2022     CHIEF COMPLAINT Patient presents for  Chief Complaint  Patient presents with   Retina Follow Up      HISTORY OF PRESENT ILLNESS: David Pugh. is a 87 y.o. male who presents to the clinic today for:   HPI     Retina Follow Up           Diagnosis: Central Retinal Vein Occlusion   Laterality: left eye   Onset: 1 year ago         Comments   1 yr fu OU OCT FP. Patient states vision is stable and unchanged since last visit. Denies any new floaters or FOL. Patient reports he uses Rhop[facility]a QHS OU. Denies hospitalizations or surgeries since last visit.      Last edited by Laurin Coder on 02/16/2022  8:05 AM.      Referring physician: Leeanne Rio, MD [place],  [place] [id number]  HISTORICAL INFORMATION:   Selected notes from the MEDICAL RECORD NUMBER    Lab Results  Component Value Date   HGBA1C 5.9 (H) 11/25/2013     CURRENT MEDICATIONS: Current Outpatient Medications (Ophthalmic Drugs)  Medication Sig   latanoprost (XALATAN) 0.005 % ophthalmic solution 1 DROP IN [facility] EYE AT BEDTIME.   No current facility-administered medications for this visit. (Ophthalmic Drugs)   Current Outpatient Medications (Other)  Medication Sig   ALPRAZolam (XANAX) 0.5 MG tablet Take 0.5 mg by mouth daily as needed for anxiety.   Apoaequorin (PREVAGEN EXTRA STRENGTH) 20 MG CAPS Take 1 capsule by mouth daily.   aspirin EC 81 MG tablet Take 81 mg by mouth daily.   B Complex Vitamins (VITAMIN B COMPLEX PO) Take 1 tablet by mouth at bedtime.    cholecalciferol (VITAMIN D3) 25 MCG (1000 UNIT) tablet Take 1,000 Units by mouth 2 (two) times daily.   citalopram (CELEXA) 20 MG tablet Take 10 mg by mouth daily.    clopidogrel (PLAVIX) 75 MG tablet TAKE 1 TABLET BY MOUTH ONCE DAILY.   fluticasone (FLONASE) 50 MCG/ACT nasal spray Place 1 spray into both nostrils at bedtime.   folic acid (FOLVITE) 1 MG tablet TAKE (1) TABLET BY MOUTH ONCE DAILY .  (Patient taking differently: Take 1 mg by mouth daily.)   ibuprofen (ADVIL,MOTRIN) 200 MG tablet Take 400 mg by mouth daily as needed for headache or moderate pain.   loperamide (IMODIUM) 2 MG capsule Take 1 capsule (2 mg total) by mouth as needed for diarrhea or loose stools (maximum 16 mg per day).   losartan (COZAAR) 50 MG tablet Take 25 mg by mouth 2 (two) times daily.    memantine (NAMENDA) 5 MG tablet Take 1 tablet (5 mg total) by mouth 2 (two) times daily.   metoprolol (LOPRESSOR) 50 MG tablet Take 50 mg by mouth 2 (two) times daily.   montelukast (SINGULAIR) 10 MG tablet Take 10 mg by mouth at bedtime.   Multiple Vitamin (MULTIVITAMIN WITH MINERALS) TABS tablet Take 1 tablet by mouth daily.   nitroGLYCERIN (NITROSTAT) 0.4 MG SL tablet PLACE ONE (1) TABLET UNDER TONGUE EVERY 5 MINUTES UP TO (3) DOSES AS NEEDED FOR CHEST PAIN.   ondansetron (ZOFRAN) 4 MG tablet Take 1 tablet (4 mg total) by mouth every 6 (six) hours as needed for nausea or vomiting.   pantoprazole (PROTONIX) 40 MG tablet TAKE (1) TABLET TWICE DAILY.   pravastatin (PRAVACHOL) 40 MG tablet TAKE 1 TABLET BY MOUTH AT BEDTIME.  vitamin B-12 (CYANOCOBALAMIN) 1000 MCG tablet Take by mouth daily.    No current facility-administered medications for this visit. (Other)      REVIEW OF SYSTEMS: ROS   Negative for: Constitutional, Gastrointestinal, Neurological, Skin, Genitourinary, Musculoskeletal, HENT, Endocrine, Cardiovascular, Eyes, Respiratory, Psychiatric, Allergic/Imm, Heme/Lymph Last edited by Hurman Horn, MD on 02/16/2022  9:02 AM.       ALLERGIES Allergies  Allergen Reactions   Shellfish Allergy Anaphylaxis   Sulfonamide Derivatives Diarrhea and Nausea Only    PAST MEDICAL HISTORY Past Medical History:  Diagnosis Date   Abnormality of gait 12/18/2013   Arthritis    Bilateral foot-drop 04/02/2019   Biliary dyskinesia    CAD (coronary artery disease) 2012   a. DES x2 in ramus/OM and mid LAD in 2012  b.  balloon angioplaty of OM 2015   CLL (chronic lymphocytic leukemia) ([place])    Colon cancer ([place]) 2013   Right hemicolectomy, did not tolerate chemotherapy   Colon polyps    tubular adenomas   Diverticula of colon 2010   Essential hypertension    Exposure to Agent Orange    Folic acid deficiency    Gastroparesis 2014   GERD (gastroesophageal reflux disease)    Glaucoma    Heart palpitations 2014   Hiatal hernia 2010   History of anemia as a child    History of kidney stones    Iron deficiency 2013   Myocardial infarction [facility]) 2012   Peripheral neuropathy    Prostate cancer ([place]) 2010   Radiation, surgery   PTSD (post-traumatic stress disorder)    Radiation proctitis    Rosacea    Past Surgical History:  Procedure Laterality Date   CARDIAC CATHETERIZATION  02/13/2011   [facility] cardiology   CATARACT EXTRACTION     2011   CATARACT EXTRACTION W/PHACO Right 08/06/2013   Procedure: CATARACT EXTRACTION PHACO AND INTRAOCULAR LENS PLACEMENT ([place]);  Surgeon: Tonny Branch, MD;  Location: [facility] ORS;  Service: Ophthalmology;  Laterality: Right;  CDE:20.96   CHOLECYSTECTOMY     COLONOSCOPY  02/12/2004   Dr. Gala Romney- L side diverticula, inflammatory  colon polyps   COLONOSCOPY  01/19/2012   RMR: Prominent changes involving the rectal mucosa consistent with radiation-induced proctitis. Multiple colonic  polyps removed as described above. Sigmoid Diverticulosis/ 1.5 x 2 cm relatively flat ulcerated lesion in cecum/path showed adenocarcinoma of the colon. descending colon polyp with tubular adenoma and one fragment of focal high grade dysplasia.    COLONOSCOPY  08/28/2012   [id number] proctitis. Colonic diverticulosis/Ulcerations at the surgical anastomosis  path: ulcerated colonic mucosa with prolapse changes, tubular adenoma.    COLONOSCOPY N/A 08/02/2013   RMR: Colonic polyps -removed as described above. Colonic diverticulosis. Status post reight hemicolectomy. Radiation proctitis-  asymptomatic.    COLONOSCOPY N/A 05/09/2015   Procedure: COLONOSCOPY;  Surgeon: Daneil Dolin, MD;  Location: [facility];  Service: Endoscopy;  Laterality: N/A;  1200-moved to 1145 [place] to notify pt   COLONOSCOPY WITH PROPOFOL N/A 11/10/2017   Procedure: COLONOSCOPY WITH PROPOFOL;  Surgeon: Daneil Dolin, MD;  Location: [facility];  Service: Endoscopy;  Laterality: N/A;   CORONARY ANGIOPLASTY  06/17/14   OM1 POBA   CORONARY STENT PLACEMENT  01/2011   2.5x45m Xience drug-eluting stent in ramus intermedius/OMI vessel, 2.5x660mPromus Element stent in mid LAD artery   ESOPHAGOGASTRODUODENOSCOPY  10/10/2008   Dr. RoTilda Burrowiatal hernia, normal esophagus, normal stomach   ESOPHAGOGASTRODUODENOSCOPY  02/12/2004   Dr. RoDudley Majornormal  ESOPHAGOGASTRODUODENOSCOPY N/A 05/09/2015   Procedure: ESOPHAGOGASTRODUODENOSCOPY (EGD);  Surgeon: Daneil Dolin, MD;  [facility];  Service: Endoscopy;  Laterality: N/A;   ESOPHAGOGASTRODUODENOSCOPY (EGD) WITH PROPOFOL N/A 11/10/2017   Procedure: ESOPHAGOGASTRODUODENOSCOPY (EGD) WITH PROPOFOL;  Surgeon: Daneil Dolin, MD;  [facility]: [facility];  Service: Endoscopy;  Laterality: N/A;  9:45am   GIVENS CAPSULE STUDY N/A 06/03/2015   Procedure: GIVENS CAPSULE STUDY;  Surgeon: Daneil Dolin, MD;  Location: [facility];  Service: Endoscopy;  Laterality: N/A;  0700   HOT HEMOSTASIS  11/10/2017   Procedure: HOT HEMOSTASIS (ARGON PLASMA COAGULATION/BICAP);  Surgeon: Daneil Dolin, MD;  Location: [facility];  Service: Endoscopy;;  rectum   KNEE SURGERY     left knee    LEFT HEART CATHETERIZATION WITH CORONARY ANGIOGRAM N/A 06/17/2014   Procedure: LEFT HEART CATHETERIZATION WITH CORONARY ANGIOGRAM;  Surgeon: Burnell Blanks, MD;  Location: [facility] CATH LAB;  Service: Cardiovascular;  Laterality: N/A;   PARTIAL COLECTOMY  02/29/2012   Procedure: PARTIAL COLECTOMY;  Surgeon: Adin Hector, MD;  Location: [facility];  Service: General;  Laterality:  Right;   POLYPECTOMY  11/10/2017   Procedure: POLYPECTOMY;  Surgeon: Daneil Dolin, MD;  Location: [facility];  Service: Endoscopy;;  gastric colon   PROSTATECTOMY     TONSILLECTOMY     TRANSURETHRAL RESECTION OF PROSTATE     VASECTOMY      FAMILY HISTORY Family History  Problem Relation Age of Onset   Throat cancer Father        dx in his 77s and again in his 69s; pipe and cigar smoker   Neuropathy Brother    Prostate cancer Brother 44   Melanoma Brother        dx in his 38s   Cancer Paternal Uncle        smoking related cancer   Heart attack Paternal Grandfather    Stomach cancer Cousin 71   Colon cancer Neg Hx     SOCIAL HISTORY Social History   Tobacco Use   Smoking status: Never   Smokeless tobacco: Never  Vaping Use   Vaping Use: Never used  Substance Use Topics   Alcohol use: Yes    Comment: 1 drink every 6 months   Drug use: No         OPHTHALMIC EXAM:  Base Eye Exam     Visual Acuity (ETDRS)       Right Left   Dist Concepcion 20/20 -1 CF at 3'   Dist ph   NI         Tonometry (Tonopen, 8:03 AM)       Right Left   Pressure 8 15         Pupils       Pupils Dark Light APD   Right PERRL 4 3 None   Left PERRL 4 3 None         Visual Fields (Counting fingers)       Left Right     Full   Restrictions Partial outer inferior temporal deficiency; Partial inner superior nasal deficiency          Extraocular Movement       Right Left    Full Full         Neuro/Psych     Mood/Affect: Normal         Dilation     Both eyes: 1.0% Mydriacyl, 2.5% Phenylephrine @ 8:03 AM  Slit Lamp and Fundus Exam     External Exam       Right Left   External Normal Normal         Slit Lamp Exam       Right Left   Lids/Lashes Normal Normal   Conjunctiva/Sclera White and quiet White and quiet   Cornea Clear Clear   Anterior Chamber Deep and quiet Deep and quiet   Iris Round and reactive Round and reactive   Lens  Posterior chamber intraocular lens Posterior chamber intraocular lens   Anterior Vitreous Normal Normal         Fundus Exam       Right Left   Posterior Vitreous Posterior vitreous detachment Posterior vitreous detachment   Disc Normal Normal   C/D Ratio 0.45 0.45   Macula Normal Atrophy, Geographic atrophy   Vessels Normal Old retinal vein occlusion, compensated stable   Periphery Normal Good moderate scatter PRP pattern retina attached            IMAGING AND PROCEDURES  Imaging and Procedures for 02/16/22  OCT, Retina - OU - Both Eyes       Right Eye Quality was good. Scan locations included subfoveal. Central Foveal Thickness: 282. Progression has been stable.   Left Eye Quality was good. Scan locations included subfoveal. Central Foveal Thickness: 197. Progression has been stable. Findings include abnormal foveal contour, central retinal atrophy, outer retinal atrophy.   Notes History of retinal vein occlusion OS, compensated, not active, no CME, RPE atrophy and diffuse foveal atrophy OS      Color Fundus Photography Optos - OU - Both Eyes       Right Eye Progression has no prior data. Disc findings include normal observations. Macula : normal observations. Vessels : normal observations. Periphery : normal observations.   Left Eye Progression has no prior data.   Notes Incidental PVD.  OD.  \OS with collateral of vessels on the optic nerve from old CRVO/Hemi CRV O , With good PRP peripherally , Central geographic atrophy OS               ASSESSMENT/PLAN:  Advanced nonexudative age-related macular degeneration of left eye with subfoveal involvement Accounts for acuity likely residual from old CME resolution from CRV O  Early stage nonexudative age-related macular degeneration of right eye Minor OD  Primary open angle glaucoma of both eyes, mild stage Continue on topical glaucoma medications for protection long-term  Right epiretinal  membrane Minor OD, not clinically impactful     ICD-10-CM   1. Central retinal vein occlusion of left eye, unspecified complication status  O53.6644 OCT, Retina - OU - Both Eyes    Color Fundus Photography Optos - OU - Both Eyes    2. Advanced nonexudative age-related macular degeneration of left eye with subfoveal involvement  H35.3124     3. Early stage nonexudative age-related macular degeneration of right eye  H35.3111     4. Primary open angle glaucoma of both eyes, mild stage  H40.1131     5. Right epiretinal membrane  H35.371       1.  OS with compensated CRVO, residual geographic atrophy of ARMD, no treatment required  2.  OU with early stage glaucoma, protection with nightly latanoprost 1 drop nightly  3.  Mild epiretinal membrane seen clinically but no impact by OCT on acuity or on examination  Ophthalmic Meds Ordered this visit:  No orders of the defined types were placed in this  encounter.      Return in about 1 year (around 02/17/2023) for DILATE OU, COLOR FP, OCT.  There are no Patient Instructions on file for this visit.   Explained the diagnoses, plan, and follow up with the patient and they expressed understanding.  Patient expressed understanding of the importance of proper follow up care.   Clent Demark Ahkeem Goede M.D. Diseases & Surgery of the Retina and Vitreous Retina & Diabetic [place] 02/16/22     Abbreviations: M myopia (nearsighted); A astigmatism; H hyperopia (farsighted); P presbyopia; Mrx spectacle prescription;  CTL contact lenses; OD right eye; OS left eye; OU both eyes  XT exotropia; ET esotropia; PEK punctate epithelial keratitis; PEE punctate epithelial erosions; DES dry eye syndrome; MGD meibomian gland dysfunction; ATs artificial tears; PFAT's preservative free artificial tears; [place] nuclear sclerotic cataract; PSC posterior subcapsular cataract; ERM epi-retinal membrane; PVD posterior vitreous detachment; RD retinal detachment; DM diabetes  mellitus; DR diabetic retinopathy; NPDR non-proliferative diabetic retinopathy; PDR proliferative diabetic retinopathy; CSME clinically significant macular edema; DME diabetic macular edema; dbh dot blot hemorrhages; CWS cotton wool spot; POAG primary open angle glaucoma; C/D cup-to-disc ratio; HVF humphrey visual field; GVF goldmann visual field; OCT optical coherence tomography; IOP intraocular pressure; BRVO Branch retinal vein occlusion; CRVO central retinal vein occlusion; CRAO central retinal artery occlusion; BRAO branch retinal artery occlusion; RT retinal tear; SB scleral buckle; PPV pars plana vitrectomy; VH Vitreous hemorrhage; PRP panretinal laser photocoagulation; IVK intravitreal kenalog; VMT vitreomacular traction; MH Macular hole;  NVD neovascularization of the disc; NVE neovascularization elsewhere; AREDS age related eye disease study; ARMD age related macular degeneration; POAG primary open angle glaucoma; EBMD epithelial/anterior basement membrane dystrophy; ACIOL anterior chamber intraocular lens; IOL intraocular lens; PCIOL posterior chamber intraocular lens; Phaco/IOL phacoemulsification with intraocular lens placement; [place] photorefractive keratectomy; LASIK laser assisted in situ keratomileusis; HTN hypertension; DM diabetes mellitus; COPD chronic obstructive pulmonary disease

## 2022-02-16 NOTE — Assessment & Plan Note (Signed)
Accounts for acuity likely residual from old CME resolution from [place]

## 2022-02-17 ENCOUNTER — Inpatient Hospital Stay (HOSPITAL_COMMUNITY): Payer: Medicare PPO | Attending: Hematology

## 2022-02-17 DIAGNOSIS — C9110 Chronic lymphocytic leukemia of B-cell type not having achieved remission: Secondary | ICD-10-CM | POA: Insufficient documentation

## 2022-02-17 DIAGNOSIS — Z8719 Personal history of other diseases of the digestive system: Secondary | ICD-10-CM | POA: Diagnosis not present

## 2022-02-17 DIAGNOSIS — Z8249 Family history of ischemic heart disease and other diseases of the circulatory system: Secondary | ICD-10-CM | POA: Insufficient documentation

## 2022-02-17 DIAGNOSIS — Z9221 Personal history of antineoplastic chemotherapy: Secondary | ICD-10-CM | POA: Diagnosis not present

## 2022-02-17 DIAGNOSIS — Z85038 Personal history of other malignant neoplasm of large intestine: Secondary | ICD-10-CM | POA: Diagnosis not present

## 2022-02-17 DIAGNOSIS — Z8546 Personal history of malignant neoplasm of prostate: Secondary | ICD-10-CM | POA: Insufficient documentation

## 2022-02-17 DIAGNOSIS — Z923 Personal history of irradiation: Secondary | ICD-10-CM | POA: Diagnosis not present

## 2022-02-17 LAB — CBC WITH DIFFERENTIAL/PLATELET
Abs Immature Granulocytes: 0.06 10*3/uL (ref 0.00–0.07)
Basophils Absolute: 0.1 10*3/uL (ref 0.0–0.1)
Basophils Relative: 1 %
Eosinophils Absolute: 0.3 10*3/uL (ref 0.0–0.5)
Eosinophils Relative: 3 %
HCT: 42.6 % (ref 39.0–52.0)
Hemoglobin: 14.1 g/dL (ref 13.0–17.0)
Immature Granulocytes: 1 %
Lymphocytes Relative: 41 %
Lymphs Abs: 5.5 10*3/uL — ABNORMAL HIGH (ref 0.7–4.0)
MCH: 31.5 pg (ref 26.0–34.0)
MCHC: 33.1 g/dL (ref 30.0–36.0)
MCV: 95.1 fL (ref 80.0–100.0)
Monocytes Absolute: 1.1 10*3/uL — ABNORMAL HIGH (ref 0.1–1.0)
Monocytes Relative: 8 %
Neutro Abs: 6.2 10*3/uL (ref 1.7–7.7)
Neutrophils Relative %: 46 %
Platelets: 354 10*3/uL (ref 150–400)
RBC: 4.48 MIL/uL (ref 4.22–5.81)
RDW: 12.5 % (ref 11.5–15.5)
WBC: 13.2 10*3/uL — ABNORMAL HIGH (ref 4.0–10.5)
nRBC: 0 % (ref 0.0–0.2)

## 2022-02-17 LAB — COMPREHENSIVE METABOLIC PANEL
ALT: 18 U/L (ref 0–44)
AST: 19 U/L (ref 15–41)
Albumin: 3.5 g/dL (ref 3.5–5.0)
Alkaline Phosphatase: 59 U/L (ref 38–126)
Anion gap: 6 (ref 5–15)
BUN: 15 mg/dL (ref 8–23)
CO2: 25 mmol/L (ref 22–32)
Calcium: 8.6 mg/dL — ABNORMAL LOW (ref 8.9–10.3)
Chloride: 110 mmol/L (ref 98–111)
Creatinine, Ser: 1.09 mg/dL (ref 0.61–1.24)
GFR, Estimated: >60 mL/min (ref >60)
Glucose, Bld: 114 mg/dL — ABNORMAL HIGH (ref 70–99)
Potassium: 4 mmol/L (ref 3.5–5.1)
Sodium: 141 mmol/L (ref 135–145)
Total Bilirubin: 0.8 mg/dL (ref 0.3–1.2)
Total Protein: 6.3 g/dL — ABNORMAL LOW (ref 6.5–8.1)

## 2022-02-17 LAB — PSA: Prostatic Specific Antigen: 0.11 ng/mL (ref 0.00–4.00)

## 2022-02-17 LAB — LACTATE DEHYDROGENASE: LDH: 122 U/L (ref 98–192)

## 2022-02-24 ENCOUNTER — Inpatient Hospital Stay (HOSPITAL_BASED_OUTPATIENT_CLINIC_OR_DEPARTMENT_OTHER): Payer: Medicare PPO | Admitting: Hematology

## 2022-02-24 VITALS — BP 125/70 | HR 67 | Temp 98.1°F | Resp 18 | Ht 69.88 in | Wt 214.5 lb

## 2022-02-24 DIAGNOSIS — C9110 Chronic lymphocytic leukemia of B-cell type not having achieved remission: Secondary | ICD-10-CM | POA: Diagnosis not present

## 2022-02-24 NOTE — Progress Notes (Signed)
[place], [place] [address]   CLINIC:  Medical Oncology/Hematology  PCP:  Leeanne Rio, MD [place] [address] [place] [address] [phone]   REASON FOR VISIT:  Follow-up for CLL  PRIOR THERAPY: intermittent feraheme (last infusion 01/03/2015)  CURRENT THERAPY: surveillance  BRIEF ONCOLOGIC HISTORY:  Oncology History  Cancer of ascending colon ([place])  02/29/2012 Procedure   laparoscopic assisted R colectomy   03/09/2012 Miscellaneous   Iron deficiency and folic acid deficiency   03/29/2012 Initial Diagnosis   Cancer of ascending colon, CEA on 01/19/2012 was WNL   08/02/2013 Procedure   Ileocolonoscopy with snare polypectomy   06/11/2014 Imaging   CT abdomen/pelvis with no findings to suggest metastatic disease. Diverticulosis   06/11/2016 Imaging   CT Abdomen/Pelvis with no findings to suggest recurrent or metastatic disease. Prostatomegaly.      CANCER STAGING: Cancer Staging  Cancer of ascending colon [facility]) Staging form: Colon and Rectum, AJCC 7th Edition - Clinical: Stage IIIB (T3, N1c, M0) - Signed by Baird Cancer, PA on 05/02/2012   INTERVAL HISTORY:  David Pugh., a 87 y.o. male, returns for routine follow-up of his CLL. Kell was last seen on 08/11/2022.   Today he reports feeling good. He denies fevers, nights sweats, and weight loss. He reports 2 falls since his last visit at which times he tripped and fell when not using his walker. He denies palpable lumps. He reports constipation. He denies hematochezia, ankle swellings, and skin rash.    REVIEW OF SYSTEMS:  Review of Systems  Constitutional:  Negative for fever and unexpected weight change.  HENT:   Negative for lump/mass.   Cardiovascular:  Negative for leg swelling.  Gastrointestinal:  Positive for constipation. Negative for blood in stool.  Endocrine: Negative for hot flashes.  Skin:  Negative for rash.  All other systems reviewed  and are negative.   PAST MEDICAL/SURGICAL HISTORY:  Past Medical History:  Diagnosis Date   Abnormality of gait 12/18/2013   Arthritis    Bilateral foot-drop 04/02/2019   Biliary dyskinesia    CAD (coronary artery disease) 2012   a. DES x2 in ramus/OM and mid LAD in 2012  b. balloon angioplaty of OM 2015   CLL (chronic lymphocytic leukemia) ([place])    Colon cancer ([place]) 2013   Right hemicolectomy, did not tolerate chemotherapy   Colon polyps    tubular adenomas   Diverticula of colon 2010   Essential hypertension    Exposure to Agent Orange    Folic acid deficiency    Gastroparesis 2014   GERD (gastroesophageal reflux disease)    Glaucoma    Heart palpitations 2014   Hiatal hernia 2010   History of anemia as a child    History of kidney stones    Iron deficiency 2013   Myocardial infarction [facility]) 2012   Peripheral neuropathy    Prostate cancer ([place]) 2010   Radiation, surgery   PTSD (post-traumatic stress disorder)    Radiation proctitis    Rosacea    Past Surgical History:  Procedure Laterality Date   CARDIAC CATHETERIZATION  02/13/2011   [facility] cardiology   CATARACT EXTRACTION     2011   CATARACT EXTRACTION W/PHACO Right 08/06/2013   Procedure: CATARACT EXTRACTION PHACO AND INTRAOCULAR LENS PLACEMENT ([place]);  Surgeon: Tonny Branch, MD;  Location: [facility];  Service: Ophthalmology;  Laterality: Right;  CDE:20.96   CHOLECYSTECTOMY  COLONOSCOPY  02/12/2004   Dr. Gala Romney- L side diverticula, inflammatory  colon polyps   COLONOSCOPY  01/19/2012   RMR: Prominent changes involving the rectal mucosa consistent with radiation-induced proctitis. Multiple colonic  polyps removed as described above. Sigmoid Diverticulosis/ 1.5 x 2 cm relatively flat ulcerated lesion in cecum/path showed adenocarcinoma of the colon. descending colon polyp with tubular adenoma and one fragment of focal high grade dysplasia.    COLONOSCOPY  08/28/2012   QQP:[id number] proctitis. Colonic  diverticulosis/Ulcerations at the surgical anastomosis  path: ulcerated colonic mucosa with prolapse changes, tubular adenoma.    COLONOSCOPY N/A 08/02/2013   RMR: Colonic polyps -removed as described above. Colonic diverticulosis. Status post reight hemicolectomy. Radiation proctitis- asymptomatic.    COLONOSCOPY N/A 05/09/2015   Procedure: COLONOSCOPY;  Surgeon: Daneil Dolin, MD;  Location: [facility];  Service: Endoscopy;  Laterality: N/A;  1200-moved to 1145 [place] to notify pt   COLONOSCOPY WITH PROPOFOL N/A 11/10/2017   Procedure: COLONOSCOPY WITH PROPOFOL;  Surgeon: Daneil Dolin, MD;  Location: [facility];  Service: Endoscopy;  Laterality: N/A;   CORONARY ANGIOPLASTY  06/17/14   OM1 POBA   CORONARY STENT PLACEMENT  01/2011   2.5x29m Xience drug-eluting stent in ramus intermedius/OMI vessel, 2.5x662mPromus Element stent in mid LAD artery   ESOPHAGOGASTRODUODENOSCOPY  10/10/2008   Dr. RoTilda Burrowiatal hernia, normal esophagus, normal stomach   ESOPHAGOGASTRODUODENOSCOPY  02/12/2004   Dr. RoDudley Majornormal    ESOPHAGOGASTRODUODENOSCOPY N/A 05/09/2015   Procedure: ESOPHAGOGASTRODUODENOSCOPY (EGD);  Surgeon: RoDaneil DolinMD;  [facility];  Service: Endoscopy;  Laterality: N/A;   ESOPHAGOGASTRODUODENOSCOPY (EGD) WITH PROPOFOL N/A 11/10/2017   Procedure: ESOPHAGOGASTRODUODENOSCOPY (EGD) WITH PROPOFOL;  Surgeon: RoDaneil DolinMD;  Location: [facility];  Service: Endoscopy;  Laterality: N/A;  9:45am   GIVENS CAPSULE STUDY N/A 06/03/2015   Procedure: GIVENS CAPSULE STUDY;  Surgeon: RoDaneil DolinMD;  Location: [facility];  Service: Endoscopy;  Laterality: N/A;  0700   HOT HEMOSTASIS  11/10/2017   Procedure: HOT HEMOSTASIS (ARGON PLASMA COAGULATION/BICAP);  Surgeon: RoDaneil DolinMD;  Location: [facility];  Service: Endoscopy;;  rectum   KNEE SURGERY     left knee    LEFT HEART CATHETERIZATION WITH CORONARY ANGIOGRAM N/A 06/17/2014   Procedure: LEFT HEART  CATHETERIZATION WITH CORONARY ANGIOGRAM;  Surgeon: ChBurnell BlanksMD;  [facility];  Service: Cardiovascular;  Laterality: N/A;   PARTIAL COLECTOMY  02/29/2012   Procedure: PARTIAL COLECTOMY;  Surgeon: HaAdin HectorMD;  Location: [facility];  Service: General;  Laterality: Right;   POLYPECTOMY  11/10/2017   Procedure: POLYPECTOMY;  Surgeon: RoDaneil DolinMD;  [facility]: [facility];  Service: Endoscopy;;  gastric colon   PROSTATECTOMY     TONSILLECTOMY     TRANSURETHRAL RESECTION OF PROSTATE     VASECTOMY      SOCIAL HISTORY:  Social History   Socioeconomic History   Marital status: Married    Spouse name: Not on file   Number of children: 3   Years of education: military   Highest education level: Not on file  Occupational History   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  Tobacco Use   Smoking status: Never   Smokeless tobacco: Never  Vaping Use   Vaping Use: Never used  Substance and Sexual Activity   Alcohol use: Yes    Comment: 1 drink every 6 months   Drug use: No  Sexual activity: Not Currently  Other Topics Concern   Not on file  Social History Narrative   Right handed   Caffeine~1 cup per day    Lives at home with wife    Social Determinants of Health   Financial Resource Strain: Low Risk  (07/14/2020)   Overall Financial Resource Strain (CARDIA)    Difficulty of Paying Living Expenses: Not hard at all  Food Insecurity: No Food Insecurity (07/14/2020)   Hunger Vital Sign    Worried About Running Out of Food in the Last Year: Never true    Ran Out of Food in the Last Year: Never true  Transportation Needs: No Transportation Needs (07/14/2020)   PRAPARE - Hydrologist (Medical): No    Lack of Transportation (Non-Medical): No  Physical Activity: Inactive (07/14/2020)   Exercise Vital Sign    Days of Exercise per Week: 0 days    Minutes of Exercise per Session: 0 min  Stress: No Stress Concern Present  (07/14/2020)   [place]    Feeling of Stress : Not at all  Social Connections: Moderately Isolated (07/14/2020)   Social Connection and Isolation Panel [NHANES]    Frequency of Communication with Friends and Family: More than three times a week    Frequency of Social Gatherings with Friends and Family: Three times a week    Attends Religious Services: Never    Active Member of Clubs or Organizations: No    Attends Archivist Meetings: Never    Marital Status: Married  Human resources officer Violence: Not At Risk (07/14/2020)   Humiliation, Afraid, Rape, and Kick questionnaire    Fear of Current or Ex-Partner: No    Emotionally Abused: No    Physically Abused: No    Sexually Abused: No    FAMILY HISTORY:  Family History  Problem Relation Age of Onset   Throat cancer Father        dx in his 43s and again in his 45s; pipe and cigar smoker   Neuropathy Brother    Prostate cancer Brother 54   Melanoma Brother        dx in his 84s   Cancer Paternal Uncle        smoking related cancer   Heart attack Paternal Grandfather    Stomach cancer Cousin 71   Colon cancer Neg Hx     CURRENT MEDICATIONS:  Current Outpatient Medications  Medication Sig Dispense Refill   ALPRAZolam (XANAX) 0.5 MG tablet Take 0.5 mg by mouth daily as needed for anxiety.     Apoaequorin (PREVAGEN EXTRA STRENGTH) 20 MG CAPS Take 1 capsule by mouth daily.     aspirin EC 81 MG tablet Take 81 mg by mouth daily.     B Complex Vitamins (VITAMIN B COMPLEX PO) Take 1 tablet by mouth at bedtime.      cholecalciferol (VITAMIN D3) 25 MCG (1000 UNIT) tablet Take 1,000 Units by mouth 2 (two) times daily.     citalopram (CELEXA) 20 MG tablet Take 10 mg by mouth daily.      clopidogrel (PLAVIX) 75 MG tablet TAKE 1 TABLET BY MOUTH ONCE DAILY. 30 tablet 6   fluticasone (FLONASE) 50 MCG/ACT nasal spray Place 1 spray into both nostrils at bedtime.     folic  acid (FOLVITE) 1 MG tablet TAKE (1) TABLET BY MOUTH ONCE DAILY . (Patient taking differently: Take 1 mg by mouth daily.) 30  tablet 0   ibuprofen (ADVIL,MOTRIN) 200 MG tablet Take 400 mg by mouth daily as needed for headache or moderate pain.     latanoprost (XALATAN) 0.005 % ophthalmic solution 1 DROP IN EACH EYE AT BEDTIME. 2.5 mL 12   loperamide (IMODIUM) 2 MG capsule Take 1 capsule (2 mg total) by mouth as needed for diarrhea or loose stools (maximum 16 mg per day). 30 capsule 0   losartan (COZAAR) 50 MG tablet Take 25 mg by mouth 2 (two) times daily.      memantine (NAMENDA) 5 MG tablet Take 1 tablet (5 mg total) by mouth 2 (two) times daily. 60 tablet 11   metoprolol (LOPRESSOR) 50 MG tablet Take 50 mg by mouth 2 (two) times daily.     montelukast (SINGULAIR) 10 MG tablet Take 10 mg by mouth at bedtime.     Multiple Vitamin (MULTIVITAMIN WITH MINERALS) TABS tablet Take 1 tablet by mouth daily.     nitroGLYCERIN (NITROSTAT) 0.4 MG SL tablet PLACE ONE (1) TABLET UNDER TONGUE EVERY 5 MINUTES UP TO (3) DOSES AS NEEDED FOR CHEST PAIN. 25 tablet 3   ondansetron (ZOFRAN) 4 MG tablet Take 1 tablet (4 mg total) by mouth every 6 (six) hours as needed for nausea or vomiting. 20 tablet 0   pantoprazole (PROTONIX) 40 MG tablet TAKE (1) TABLET TWICE DAILY. 60 tablet 5   pravastatin (PRAVACHOL) 40 MG tablet TAKE 1 TABLET BY MOUTH AT BEDTIME. 90 tablet 3   vitamin B-12 (CYANOCOBALAMIN) 1000 MCG tablet Take by mouth daily.      No current facility-administered medications for this visit.    ALLERGIES:  Allergies  Allergen Reactions   Shellfish Allergy Anaphylaxis   Sulfonamide Derivatives Diarrhea and Nausea Only    PHYSICAL EXAM:  Performance status (ECOG): 1 - Symptomatic but completely ambulatory  There were no vitals filed for this visit. Wt Readings from Last 3 Encounters:  11/24/21 210 lb (95.3 kg)  11/11/21 213 lb (96.6 kg)  08/11/21 217 lb (98.4 kg)   Physical Exam Vitals reviewed.   Constitutional:      Appearance: Normal appearance.  Cardiovascular:     Rate and Rhythm: Normal rate and regular rhythm.     Pulses: Normal pulses.     Heart sounds: Normal heart sounds.  Pulmonary:     Effort: Pulmonary effort is normal.     Breath sounds: Normal breath sounds.  Abdominal:     Palpations: Abdomen is soft. There is no hepatomegaly, splenomegaly or mass.     Tenderness: There is no abdominal tenderness.  Lymphadenopathy:     Cervical: No cervical adenopathy.     Right cervical: No superficial, deep or posterior cervical adenopathy.    Left cervical: No superficial, deep or posterior cervical adenopathy.     Upper Body:     Right upper body: No supraclavicular or axillary adenopathy.     Left upper body: No supraclavicular or axillary adenopathy.     Lower Body: No right inguinal adenopathy. No left inguinal adenopathy.  Neurological:     General: No focal deficit present.     Mental Status: He is alert and oriented to person, place, and time.  Psychiatric:        Mood and Affect: Mood normal.        Behavior: Behavior normal.      LABORATORY DATA:  I have reviewed the labs as listed.     Latest Ref Rng & Units 02/17/2022   10:08 AM  08/04/2021    1:29 PM 05/01/2021    9:22 AM  CBC  WBC 4.0 - 10.5 K/uL 13.2  15.3  14.0   Hemoglobin 13.0 - 17.0 g/dL 14.1  14.5  14.2   Hematocrit 39.0 - 52.0 % 42.6  43.4  43.0   Platelets 150 - 400 K/uL 354  402  326       Latest Ref Rng & Units 02/17/2022   10:08 AM 08/04/2021    1:29 PM 05/01/2021    9:22 AM  CMP  Glucose 70 - 99 mg/dL 114  81  104   BUN 8 - 23 mg/dL '15  13  21   '$ Creatinine 0.61 - 1.24 mg/dL 1.09  1.03  1.11   Sodium 135 - 145 mmol/L 141  138  141   Potassium 3.5 - 5.1 mmol/L 4.0  3.9  4.1   Chloride 98 - 111 mmol/L 110  107  109   CO2 22 - 32 mmol/L '25  25  25   '$ Calcium 8.9 - 10.3 mg/dL 8.6  8.4  8.5   Total Protein 6.5 - 8.1 g/dL 6.3  6.4  6.4   Total Bilirubin 0.3 - 1.2 mg/dL 0.8  0.6  0.9    Alkaline Phos 38 - 126 U/L 59  59  54   AST 15 - 41 U/L '19  21  22   '$ ALT 0 - 44 U/L '18  20  19     '$ DIAGNOSTIC IMAGING:  I have independently reviewed the scans and discussed with the patient. Color Fundus Photography Optos - OU - Both Eyes  Result Date: 02/16/2022 Right Eye Progression has no prior data. Disc findings include normal observations. Macula : normal observations. Vessels : normal observations. Periphery : normal observations. Left Eye Progression has no prior data. Notes Incidental PVD.  OD. \OS with collateral of vessels on the optic nerve from old CRVO/Hemi CRV O , With good PRP peripherally , Central geographic atrophy OS   OCT, Retina - OU - Both Eyes  Result Date: 02/16/2022 Right Eye Quality was good. Scan locations included subfoveal. Central Foveal Thickness: 282. Progression has been stable. Left Eye Quality was good. Scan locations included subfoveal. Central Foveal Thickness: 197. Progression has been stable. Findings include abnormal foveal contour, central retinal atrophy, outer retinal atrophy. Notes History of retinal vein occlusion OS, compensated, not active, no CME, RPE atrophy and diffuse foveal atrophy OS    ASSESSMENT:  1.  Stage 0 CLL: - He was diagnosed by flow on 12/13/2014   2.  Stage III ascending colon cancer: - He had 2 out of 20 lymph nodes positive. -Status post right hemicolectomy on 02/29/2012, could not tolerate adjuvant Xeloda. - EGD showed multiple benign gastric polyps, otherwise within normal limits. -He is completed 5 years of surveillance.  No more scans needed at this time. -He had a colonoscopy on 11/10/2017 which showed radiation proctitis changes.  Diverticulosis in the sigmoid colon.  One polyp in the ascending colon which was removed     3.  Prostate cancer: -Underwent radiation therapy at [facility] in 2011. -He also underwent a TURP x2. - He follows up with Dr. Nevada Crane his urologist in [facility].   PLAN:  1.  Stage 0  CLL: - He does not have any fevers, night sweats or weight loss in the last 6 months.  No infections. - He fell couple of times.  He is now using a rollator walker. - Physical  exam did not reveal any palpable adenopathy or organomegaly. - Labs reviewed shows normal LFTs.  Hemoglobin and platelets were normal.  White count is 13.2 with lymphocytes.  LDH was normal. - No indication for treatment at this time.  RTC 6 months for follow-up with repeat labs and exam.   2.  Stage III ascending colon cancer: - No change in bowel habits.  No bleeding per rectum.  Last CEA was 2.0.   3.  Prostate cancer: - Latest PSA 0.11.    Orders placed this encounter:  No orders of the defined types were placed in this encounter.    Derek Jack, MD [place] [phone]   I, Thana Ates, am acting as a scribe for Dr. Derek Jack.  I, Derek Jack MD, have reviewed the above documentation for accuracy and completeness, and I agree with the above.

## 2022-02-24 NOTE — Patient Instructions (Signed)
[place] at [facility] Discharge Instructions   You were seen and examined today by Dr. Delton Coombes.  He reviewed the results of your lab work which are normal/stable.   Return as scheduled for lab work and office visit.    Thank you for choosing [place] at [facility] to provide your oncology and hematology care.  To afford each patient quality time with our provider, please arrive at least 15 minutes before your scheduled appointment time.   If you have a lab appointment with the [place] [facility] please come in thru the Main Entrance and check in at the main information desk.  You need to re-schedule your appointment should you arrive 10 or more minutes late.  We strive to give you quality time with our providers, and arriving late affects you and other patients whose appointments are after yours.  Also, if you no show three or more times for appointments you may be dismissed from the clinic at the providers discretion.     Again, thank you for choosing [facility].  Our hope is that these requests will decrease the amount of time that you wait before being seen by our physicians.       _____________________________________________________________  Should you have questions after your visit to [facility], please contact our office at [phone] and follow the prompts.  Our office hours are 8:00 a.m. and 4:30 p.m. Monday - Friday.  Please note that voicemails left after 4:00 p.m. may not be returned until the following business day.  We are closed weekends and major holidays.  You do have access to a nurse 24-7, just call the main number to the clinic [phone] and do not press any options, hold on the line and a nurse will answer the phone.    For prescription refill requests, have your pharmacy contact our office and allow 72 hours.    Due to Covid, you will need to wear a mask upon entering the hospital. If  you do not have a mask, a mask will be given to you at the Main Entrance upon arrival. For doctor visits, patients may have 1 support person age 81 or older with them. For treatment visits, patients can not have anyone with them due to social distancing guidelines and our immunocompromised population.

## 2022-04-05 ENCOUNTER — Telehealth: Payer: Self-pay | Admitting: Neurology

## 2022-04-05 NOTE — Telephone Encounter (Signed)
Pt is calling and requesting that is taking  memantine (NAMENDA) 5 MG tablet (Expired). Pt said he has had diarrhea for the last five day and he would like to discuss this side effect with a nurse.

## 2022-04-05 NOTE — Telephone Encounter (Signed)
Agree  Thank you

## 2022-04-05 NOTE — Telephone Encounter (Signed)
I returned pt's call. He reports on 04/01/2022 he had an episode of severe diarrhea and once a day since this date. He reports he is on namenda 5 mg bid with good tolerance/benefit leading up to last week.  We discussed and pt will take 1 tablet of the namenda for the next 3-4 days and if sx improve will try and add second tablet back. If sx do not improve he will call back and update our office.

## 2022-04-08 NOTE — Telephone Encounter (Signed)
Pt would like to let Dr.Camara know medication is working. Pt said would like to continue reduce dose another week.

## 2022-04-08 NOTE — Telephone Encounter (Signed)
OK for patient to continue with reduce dose of Namenda for another week.

## 2022-04-12 NOTE — Telephone Encounter (Signed)
I called the patient and notified him of Dr. Jabier Mutton response.

## 2022-05-17 ENCOUNTER — Inpatient Hospital Stay: Payer: Medicare PPO | Attending: Hematology | Admitting: Cardiology

## 2022-05-17 ENCOUNTER — Encounter: Payer: Self-pay | Admitting: Cardiology

## 2022-05-17 VITALS — BP 126/70 | HR 52 | Ht 71.0 in | Wt 201.8 lb

## 2022-05-17 DIAGNOSIS — E782 Mixed hyperlipidemia: Secondary | ICD-10-CM | POA: Diagnosis not present

## 2022-05-17 DIAGNOSIS — I25119 Atherosclerotic heart disease of native coronary artery with unspecified angina pectoris: Secondary | ICD-10-CM

## 2022-05-17 NOTE — Progress Notes (Signed)
Cardiology Office Note  Date: 05/17/2022   ID: David Ades., DOB 02/24/1935, MRN [id number]  PCP:  Leeanne Rio, MD  Cardiologist:  Rozann Lesches, MD Electrophysiologist:  None   Chief Complaint  Patient presents with   Cardiac follow-up    History of Present Illness: David Markson. is an 87 y.o. male last seen in March.  He is here for a routine visit.  Reports no angina symptoms or nitroglycerin use.  Exercises with an elliptical machine 30 minutes at a time 5 days a week.  He does have some challenges related to arthritis.  I reviewed his medications which are noted below and stable from a cardiac perspective.  I personally reviewed his ECG today which shows sinus bradycardia with LVH and old inferior infarct pattern.  He continues to follow with Dr. Huel Cote for primary care.  LDL was 56 in March.  Past Medical History:  Diagnosis Date   Abnormality of gait 12/18/2013   Arthritis    Basal cell carcinoma of scalp and skin of neck    Bilateral foot-drop 04/02/2019   Biliary dyskinesia    CAD (coronary artery disease) 2012   a. DES x2 in ramus/OM and mid LAD in 2012  b. balloon angioplaty of OM 2015   CLL (chronic lymphocytic leukemia) ([place])    Colon cancer ([place]) 2013   Right hemicolectomy, did not tolerate chemotherapy   Colon polyps    tubular adenomas   Diverticula of colon 2010   Essential hypertension    Exposure to Agent Orange    Folic acid deficiency    Gastroparesis 2014   GERD (gastroesophageal reflux disease)    Glaucoma    Heart palpitations 2014   Hiatal hernia 2010   History of anemia as a child    History of kidney stones    Iron deficiency 2013   Myocardial infarction [facility]) 2012   Peripheral neuropathy    Prostate cancer ([place]) 2010   Radiation, surgery   PTSD (post-traumatic stress disorder)    Radiation proctitis    Rosacea     Past Surgical History:  Procedure Laterality Date   CARDIAC CATHETERIZATION  02/13/2011    [facility], [facility] cardiology   CATARACT EXTRACTION     2011   CATARACT EXTRACTION W/PHACO Right 08/06/2013   Procedure: CATARACT EXTRACTION PHACO AND INTRAOCULAR LENS PLACEMENT ([place]);  Surgeon: Tonny Branch, MD;  Location: [facility];  Service: Ophthalmology;  Laterality: Right;  CDE:20.96   CHOLECYSTECTOMY     COLONOSCOPY  02/12/2004   Dr. Gala Romney- L side diverticula, inflammatory  colon polyps   COLONOSCOPY  01/19/2012   RMR: Prominent changes involving the rectal mucosa consistent with radiation-induced proctitis. Multiple colonic  polyps removed as described above. Sigmoid Diverticulosis/ 1.5 x 2 cm relatively flat ulcerated lesion in cecum/path showed adenocarcinoma of the colon. descending colon polyp with tubular adenoma and one fragment of focal high grade dysplasia.    COLONOSCOPY  08/28/2012   ION:[id number] proctitis. Colonic diverticulosis/Ulcerations at the surgical anastomosis  path: ulcerated colonic mucosa with prolapse changes, tubular adenoma.    COLONOSCOPY N/A 08/02/2013   RMR: Colonic polyps -removed as described above. Colonic diverticulosis. Status post reight hemicolectomy. Radiation proctitis- asymptomatic.    COLONOSCOPY N/A 05/09/2015   Procedure: COLONOSCOPY;  Surgeon: Daneil Dolin, MD;  Location: [facility];  Service: Endoscopy;  Laterality: N/A;  1200-moved to [address] to notify pt   COLONOSCOPY WITH PROPOFOL N/A 11/10/2017   Procedure: COLONOSCOPY  WITH PROPOFOL;  Surgeon: Daneil Dolin, MD;  [facility]: [facility];  Service: Endoscopy;  Laterality: N/A;   CORONARY ANGIOPLASTY  06/17/14   OM1 POBA   CORONARY STENT PLACEMENT  01/2011   2.5x25m Xience drug-eluting stent in ramus intermedius/OMI vessel, 2.5x67mPromus Element stent in mid LAD artery   ESOPHAGOGASTRODUODENOSCOPY  10/10/2008   Dr. RoTilda Burrowiatal hernia, normal esophagus, normal stomach   ESOPHAGOGASTRODUODENOSCOPY  02/12/2004   Dr. RoDudley Majornormal    ESOPHAGOGASTRODUODENOSCOPY N/A 05/09/2015   Procedure:  ESOPHAGOGASTRODUODENOSCOPY (EGD);  Surgeon: RoDaneil DolinMD;  [facility];  Service: Endoscopy;  Laterality: N/A;   ESOPHAGOGASTRODUODENOSCOPY (EGD) WITH PROPOFOL N/A 11/10/2017   Procedure: ESOPHAGOGASTRODUODENOSCOPY (EGD) WITH PROPOFOL;  Surgeon: RoDaneil DolinMD;  [facility];  Service: Endoscopy;  Laterality: N/A;  9:45am   GIVENS CAPSULE STUDY N/A 06/03/2015   Procedure: GIVENS CAPSULE STUDY;  Surgeon: RoDaneil DolinMD;  [facility];  Service: Endoscopy;  Laterality: N/A;  0700   HOT HEMOSTASIS  11/10/2017   Procedure: HOT HEMOSTASIS (ARGON PLASMA COAGULATION/BICAP);  Surgeon: RoDaneil DolinMD;  [facility];  Service: Endoscopy;;  rectum   KNEE SURGERY     left knee    LEFT HEART CATHETERIZATION WITH CORONARY ANGIOGRAM N/A 06/17/2014   Procedure: LEFT HEART CATHETERIZATION WITH CORONARY ANGIOGRAM;  Surgeon: ChBurnell BlanksMD;  [facility];  Service: Cardiovascular;  Laterality: N/A;   PARTIAL COLECTOMY  02/29/2012   Procedure: PARTIAL COLECTOMY;  Surgeon: HaAdin HectorMD;  Location: [facility];  Service: General;  Laterality: Right;   POLYPECTOMY  11/10/2017   Procedure: POLYPECTOMY;  Surgeon: RoDaneil DolinMD;  [facility];  Service: Endoscopy;;  gastric colon   PROSTATECTOMY     TONSILLECTOMY     TRANSURETHRAL RESECTION OF PROSTATE     VASECTOMY      Current Outpatient Medications  Medication Sig Dispense Refill   ALPRAZolam (XANAX) 0.5 MG tablet Take 0.5 mg by mouth daily as needed for anxiety.     Apoaequorin (PREVAGEN EXTRA STRENGTH) 20 MG CAPS Take 1 capsule by mouth daily.     aspirin EC 81 MG tablet Take 81 mg by mouth daily.     B Complex Vitamins (VITAMIN B COMPLEX PO) Take 1 tablet by mouth at bedtime.      cholecalciferol (VITAMIN D3) 25 MCG (1000 UNIT) tablet Take 1,000 Units by mouth 2 (two) times daily.     citalopram (CELEXA) 20 MG tablet Take 10 mg by mouth daily.       clopidogrel (PLAVIX) 75 MG tablet TAKE 1 TABLET BY MOUTH ONCE DAILY. 30 tablet 6   fluticasone (FLONASE) 50 MCG/ACT nasal spray Place 1 spray into both nostrils at bedtime.     folic acid (FOLVITE) 1 MG tablet TAKE (1) TABLET BY MOUTH ONCE DAILY . (Patient taking differently: Take 1 mg by mouth daily.) 30 tablet 0   ibuprofen (ADVIL,MOTRIN) 200 MG tablet Take 400 mg by mouth daily as needed for headache or moderate pain.     latanoprost (XALATAN) 0.005 % ophthalmic solution 1 DROP IN EACH EYE AT BEDTIME. 2.5 mL 12   loperamide (IMODIUM) 2 MG capsule Take 1 capsule (2 mg total) by mouth as needed for diarrhea or loose stools (maximum 16 mg per day). 30 capsule 0   losartan (COZAAR) 50 MG tablet Take 25 mg by mouth 2 (two) times daily.  memantine (NAMENDA) 5 MG tablet Take 1 tablet (5 mg total) by mouth 2 (two) times daily. 60 tablet 11   metoprolol (LOPRESSOR) 50 MG tablet Take 50 mg by mouth 2 (two) times daily.     montelukast (SINGULAIR) 10 MG tablet Take 10 mg by mouth at bedtime.     Multiple Vitamin (MULTIVITAMIN WITH MINERALS) TABS tablet Take 1 tablet by mouth daily.     nitroGLYCERIN (NITROSTAT) 0.4 MG SL tablet PLACE ONE (1) TABLET UNDER TONGUE EVERY 5 MINUTES UP TO (3) DOSES AS NEEDED FOR CHEST PAIN. 25 tablet 3   ondansetron (ZOFRAN) 4 MG tablet Take 1 tablet (4 mg total) by mouth every 6 (six) hours as needed for nausea or vomiting. 20 tablet 0   pantoprazole (PROTONIX) 40 MG tablet TAKE (1) TABLET TWICE DAILY. 60 tablet 5   pravastatin (PRAVACHOL) 40 MG tablet TAKE 1 TABLET BY MOUTH AT BEDTIME. 90 tablet 3   vitamin B-12 (CYANOCOBALAMIN) 1000 MCG tablet Take by mouth daily.      No current facility-administered medications for this visit.   Allergies:  Shellfish allergy and Sulfonamide derivatives   ROS: No palpitations or syncope.  Physical Exam: VS:  BP 126/70   Pulse (!) 52   Ht '5\' 11"'$  (1.803 m)   Wt 201 lb 12.8 oz (91.5 kg)   SpO2 97%   BMI 28.15 kg/m , BMI Body  mass index is 28.15 kg/m.  Wt Readings from Last 3 Encounters:  05/17/22 201 lb 12.8 oz (91.5 kg)  02/24/22 214 lb 8 oz (97.3 kg)  11/24/21 210 lb (95.3 kg)    General: Patient appears comfortable at rest. HEENT: Conjunctiva and lids normal. Neck: Supple, no elevated JVP or carotid bruits, no thyromegaly. Lungs: Clear to auscultation, nonlabored breathing at rest. Cardiac: Regular rate and rhythm, no S3, 1/6 systolic murmur. Extremities: No pitting edema.  ECG:  An ECG dated 05/01/2021 was personally reviewed today and demonstrated:  Sinus rhythm with IVCD and nonspecific ST-T changes.  Recent Labwork: 08/04/2021: Magnesium 2.0 02/17/2022: ALT 18; AST 19; BUN 15; Creatinine, Ser 1.09; Hemoglobin 14.1; Platelets 354; Potassium 4.0; Sodium 141  March 2023: Cholesterol 132, triglycerides 99, HDL 58, LDL 56  Other Studies Reviewed Today:  No interval cardiac testing for review today.  Assessment and Plan:  1.  CAD status post DES x2 to the ramus intermedius/obtuse marginal system as well as mid LAD in 2012.  Subsequently underwent angioplasty of the obtuse marginal in 2015.  We are following him expectantly on medical therapy in the absence of angina symptoms and with a regular exercise plan.  ECG reviewed.  Continue aspirin, Plavix, Lopressor, Cozaar, Pravachol, and as needed nitroglycerin.  2.  Mid hyperlipidemia on Pravachol with recent LDL 56.  Medication Adjustments/Labs and Tests Ordered: Current medicines are reviewed at length with the patient today.  Concerns regarding medicines are outlined above.   Tests Ordered: Orders Placed This Encounter  Procedures   EKG 12-Lead    Medication Changes: No orders of the defined types were placed in this encounter.   Disposition:  Follow up  6 months.  Signed, Satira Sark, MD, [facility] 05/17/2022 4:10 PM    [facility] [place], [place], [place] [address] Phone: [phone]; Fax: [phone]

## 2022-05-17 NOTE — Patient Instructions (Signed)

## 2022-08-19 ENCOUNTER — Other Ambulatory Visit: Payer: Self-pay | Admitting: Cardiology

## 2022-09-01 ENCOUNTER — Inpatient Hospital Stay: Payer: Medicare PPO | Attending: Hematology

## 2022-09-01 DIAGNOSIS — Z9221 Personal history of antineoplastic chemotherapy: Secondary | ICD-10-CM | POA: Insufficient documentation

## 2022-09-01 DIAGNOSIS — Z8249 Family history of ischemic heart disease and other diseases of the circulatory system: Secondary | ICD-10-CM | POA: Insufficient documentation

## 2022-09-01 DIAGNOSIS — Z85038 Personal history of other malignant neoplasm of large intestine: Secondary | ICD-10-CM | POA: Insufficient documentation

## 2022-09-01 DIAGNOSIS — Z8719 Personal history of other diseases of the digestive system: Secondary | ICD-10-CM | POA: Insufficient documentation

## 2022-09-01 DIAGNOSIS — C9110 Chronic lymphocytic leukemia of B-cell type not having achieved remission: Secondary | ICD-10-CM | POA: Insufficient documentation

## 2022-09-01 DIAGNOSIS — Z8546 Personal history of malignant neoplasm of prostate: Secondary | ICD-10-CM | POA: Insufficient documentation

## 2022-09-01 DIAGNOSIS — Z923 Personal history of irradiation: Secondary | ICD-10-CM | POA: Insufficient documentation

## 2022-09-01 LAB — CBC WITH DIFFERENTIAL/PLATELET
Abs Immature Granulocytes: 0.07 10*3/uL (ref 0.00–0.07)
Basophils Absolute: 0.1 10*3/uL (ref 0.0–0.1)
Basophils Relative: 0 %
Eosinophils Absolute: 0.2 10*3/uL (ref 0.0–0.5)
Eosinophils Relative: 2 %
HCT: 42.6 % (ref 39.0–52.0)
Hemoglobin: 14 g/dL (ref 13.0–17.0)
Immature Granulocytes: 1 %
Lymphocytes Relative: 42 %
Lymphs Abs: 5.9 10*3/uL — ABNORMAL HIGH (ref 0.7–4.0)
MCH: 31.1 pg (ref 26.0–34.0)
MCHC: 32.9 g/dL (ref 30.0–36.0)
MCV: 94.7 fL (ref 80.0–100.0)
Monocytes Absolute: 1.1 10*3/uL — ABNORMAL HIGH (ref 0.1–1.0)
Monocytes Relative: 8 %
Neutro Abs: 6.7 10*3/uL (ref 1.7–7.7)
Neutrophils Relative %: 47 %
Platelets: 345 10*3/uL (ref 150–400)
RBC: 4.5 MIL/uL (ref 4.22–5.81)
RDW: 12.5 % (ref 11.5–15.5)
WBC: 14.1 10*3/uL — ABNORMAL HIGH (ref 4.0–10.5)
nRBC: 0 % (ref 0.0–0.2)

## 2022-09-01 LAB — COMPREHENSIVE METABOLIC PANEL
ALT: 19 U/L (ref 0–44)
AST: 21 U/L (ref 15–41)
Albumin: 3.6 g/dL (ref 3.5–5.0)
Alkaline Phosphatase: 61 U/L (ref 38–126)
Anion gap: 7 (ref 5–15)
BUN: 14 mg/dL (ref 8–23)
CO2: 26 mmol/L (ref 22–32)
Calcium: 8.7 mg/dL — ABNORMAL LOW (ref 8.9–10.3)
Chloride: 107 mmol/L (ref 98–111)
Creatinine, Ser: 1.02 mg/dL (ref 0.61–1.24)
GFR, Estimated: >60 mL/min (ref >60)
Glucose, Bld: 101 mg/dL — ABNORMAL HIGH (ref 70–99)
Potassium: 4 mmol/L (ref 3.5–5.1)
Sodium: 140 mmol/L (ref 135–145)
Total Bilirubin: 1.2 mg/dL (ref 0.3–1.2)
Total Protein: 6.5 g/dL (ref 6.5–8.1)

## 2022-09-01 LAB — LACTATE DEHYDROGENASE: LDH: 111 U/L (ref 98–192)

## 2022-09-02 LAB — CEA: CEA: 1.4 ng/mL (ref 0.0–4.7)

## 2022-09-08 ENCOUNTER — Other Ambulatory Visit: Payer: Self-pay | Admitting: *Deleted

## 2022-09-08 ENCOUNTER — Encounter: Payer: Self-pay | Admitting: Hematology

## 2022-09-08 ENCOUNTER — Inpatient Hospital Stay (HOSPITAL_BASED_OUTPATIENT_CLINIC_OR_DEPARTMENT_OTHER): Payer: Medicare PPO | Admitting: Hematology

## 2022-09-08 VITALS — BP 165/65 | HR 61 | Temp 97.8°F | Resp 18 | Wt 205.4 lb

## 2022-09-08 DIAGNOSIS — C9110 Chronic lymphocytic leukemia of B-cell type not having achieved remission: Secondary | ICD-10-CM | POA: Diagnosis not present

## 2022-09-08 NOTE — Progress Notes (Signed)
[place], [place] [address]   CLINIC:  Medical Oncology/Hematology  PCP:  Leeanne Rio, MD [place] / [place] [address] [phone]   REASON FOR VISIT:  Follow-up for CLL  PRIOR THERAPY: intermittent feraheme (last infusion 01/03/2015)  CURRENT THERAPY: surveillance  BRIEF ONCOLOGIC HISTORY:  Oncology History  Cancer of ascending colon ([place])  02/29/2012 Procedure   laparoscopic assisted R colectomy   03/09/2012 Miscellaneous   Iron deficiency and folic acid deficiency   03/29/2012 Initial Diagnosis   Cancer of ascending colon, CEA on 01/19/2012 was WNL   08/02/2013 Procedure   Ileocolonoscopy with snare polypectomy   06/11/2014 Imaging   CT abdomen/pelvis with no findings to suggest metastatic disease. Diverticulosis   06/11/2016 Imaging   CT Abdomen/Pelvis with no findings to suggest recurrent or metastatic disease. Prostatomegaly.      CANCER STAGING:  Cancer Staging  Cancer of ascending colon [facility]) Staging form: Colon and Rectum, AJCC 7th Edition - Clinical: Stage IIIB (T3, N1c, M0) - Signed by Baird Cancer, PA on 05/02/2012   INTERVAL HISTORY:  [name], a 88 y.o. male, seen for follow-up of CLL.  Since last visit 6 months ago, he denied any infections.  Denied any fevers, night sweats or weight loss.  Had sustained 2 falls in the last 6 months.  Uses cane in the house and walker outdoors.  He tripped over at home using cane.  REVIEW OF SYSTEMS:  Review of Systems  Constitutional:  Negative for fever and unexpected weight change.  HENT:   Negative for lump/mass.   Cardiovascular:  Negative for leg swelling.  Gastrointestinal:  Positive for constipation. Negative for blood in stool.  Endocrine: Negative for hot flashes.  Musculoskeletal:  Positive for arthralgias (Right knee pain).  Skin:  Negative for rash.  All other systems reviewed and are negative.   PAST MEDICAL/SURGICAL  HISTORY:  Past Medical History:  Diagnosis Date   Abnormality of gait 12/18/2013   Arthritis    Basal cell carcinoma of scalp and skin of neck    Bilateral foot-drop 04/02/2019   Biliary dyskinesia    CAD (coronary artery disease) 2012   a. DES x2 in ramus/OM and mid LAD in 2012  b. balloon angioplaty of OM 2015   CLL (chronic lymphocytic leukemia) ([place])    Colon cancer ([place]) 2013   Right hemicolectomy, did not tolerate chemotherapy   Colon polyps    tubular adenomas   Diverticula of colon 2010   Essential hypertension    Exposure to Agent Orange    Folic acid deficiency    Gastroparesis 2014   GERD (gastroesophageal reflux disease)    Glaucoma    Heart palpitations 2014   Hiatal hernia 2010   History of anemia as a child    History of kidney stones    Iron deficiency 2013   Myocardial infarction [facility]) 2012   Peripheral neuropathy    Prostate cancer ([place]) 2010   Radiation, surgery   PTSD (post-traumatic stress disorder)    Radiation proctitis    Rosacea    Past Surgical History:  Procedure Laterality Date   CARDIAC CATHETERIZATION  02/13/2011   [facility], [facility] cardiology   CATARACT EXTRACTION     2011   CATARACT EXTRACTION W/PHACO Right 08/06/2013   Procedure: CATARACT EXTRACTION PHACO AND INTRAOCULAR LENS PLACEMENT ([place]);  Surgeon: Tonny Branch, MD;  Location: [facility] ORS;  Service: Ophthalmology;  Laterality: Right;  CDE:20.96   CHOLECYSTECTOMY     COLONOSCOPY  02/12/2004   Dr. Gala Romney- L side diverticula, inflammatory  colon polyps   COLONOSCOPY  01/19/2012   RMR: Prominent changes involving the rectal mucosa consistent with radiation-induced proctitis. Multiple colonic  polyps removed as described above. Sigmoid Diverticulosis/ 1.5 x 2 cm relatively flat ulcerated lesion in cecum/path showed adenocarcinoma of the colon. descending colon polyp with tubular adenoma and one fragment of focal high grade dysplasia.    COLONOSCOPY  08/28/2012   NTI:[id number] proctitis. Colonic  diverticulosis/Ulcerations at the surgical anastomosis  path: ulcerated colonic mucosa with prolapse changes, tubular adenoma.    COLONOSCOPY N/A 08/02/2013   RMR: Colonic polyps -removed as described above. Colonic diverticulosis. Status post reight hemicolectomy. Radiation proctitis- asymptomatic.    COLONOSCOPY N/A 05/09/2015   Procedure: COLONOSCOPY;  Surgeon: Daneil Dolin, MD;  Location: [facility] ENDO SUITE;  Service: Endoscopy;  Laterality: N/A;  1200-moved to [address]5 [address] to notify pt   COLONOSCOPY WITH PROPOFOL N/A 11/10/2017   Procedure: COLONOSCOPY WITH PROPOFOL;  Surgeon: Daneil Dolin, MD;  Location: [facility] END[facility] SUITE;  Service: Endoscopy;  Laterality: N/A;   CORONARY ANGIOPLASTY  06/17/14   OM1 POBA   CORONARY STENT PLACEMENT  01/2011   2.5x15m Xience drug-eluting stent in ramus intermedius/OMI vessel, 2.5x632mPromus Element stent in mid LAD artery   ESOPHAGOGASTRODUODENOSCOPY  10/10/2008   Dr. RoTilda Burrowiatal hernia, normal esophagus, normal stomach   ESOPHAGOGASTRODUODENOSCOPY  02/12/2004   Dr. RoDudley Majornormal    ESOPHAGOGASTRODUODENOSCOPY N/A 05/09/2015   Procedure: ESOPHAGOGASTRODUODENOSCOPY (EGD);  Surgeon: RoDaneil DolinMD;  Location: [facility];  Service: Endoscopy;  Laterality: N/A;   ESOPHAGOGASTRODUODENOSCOPY (EGD) WITH PROPOFOL N/A 11/10/2017   Procedure: ESOPHAGOGASTRODUODENOSCOPY (EGD) WITH PROPOFOL;  Surgeon: RoDaneil DolinMD;  Location: [facility];  Service: Endoscopy;  Laterality: N/A;  9:45am   GIVENS CAPSULE STUDY N/A 06/03/2015   Procedure: GIVENS CAPSULE STUDY;  Surgeon: RoDaneil DolinMD;  Location: [facility];  Service: Endoscopy;  Laterality: N/A;  0700   HOT HEMOSTASIS  11/10/2017   Procedure: HOT HEMOSTASIS (ARGON PLASMA COAGULATION/BICAP);  Surgeon: RoDaneil DolinMD;  Location: [facility];  Service: Endoscopy;;  rectum   KNEE SURGERY     left knee    LEFT HEART CATHETERIZATION WITH CORONARY ANGIOGRAM N/A 06/17/2014   Procedure: LEFT HEART  CATHETERIZATION WITH CORONARY ANGIOGRAM;  Surgeon: ChBurnell BlanksMD;  [facility];  Service: Cardiovascular;  Laterality: N/A;   PARTIAL COLECTOMY  02/29/2012   Procedure: PARTIAL COLECTOMY;  Surgeon: HaAdin HectorMD;  Location: [facility];  Service: General;  Laterality: Right;   POLYPECTOMY  11/10/2017   Procedure: POLYPECTOMY;  Surgeon: RoDaneil DolinMD;  Location: [facility];  Service: Endoscopy;;  gastric colon   PROSTATECTOMY     TONSILLECTOMY     TRANSURETHRAL RESECTION OF PROSTATE     VASECTOMY      SOCIAL HISTORY:  Social History   Socioeconomic History   Marital status: Married    Spouse name: Not on file   Number of children: 3   Years of education: military   Highest education level: Not on file  Occupational History   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  Tobacco Use   Smoking status: Never   Smokeless tobacco: Never  Vaping Use   Vaping Use: Never used  Substance and Sexual Activity   Alcohol use: Yes    Comment: 1 drink every 6  months   Drug use: No   Sexual activity: Not Currently  Other Topics Concern   Not on file  Social History Narrative   Right handed   Caffeine~1 cup per day    Lives at home with wife    Social Determinants of Health   Financial Resource Strain: Low Risk  (07/14/2020)   Overall Financial Resource Strain (CARDIA)    Difficulty of Paying Living Expenses: Not hard at all  Food Insecurity: No Food Insecurity (07/14/2020)   Hunger Vital Sign    Worried About Running Out of Food in the Last Year: Never true    Ran Out of Food in the Last Year: Never true  Transportation Needs: No Transportation Needs (07/14/2020)   PRAPARE - Hydrologist (Medical): No    Lack of Transportation (Non-Medical): No  Physical Activity: Inactive (07/14/2020)   Exercise Vital Sign    Days of Exercise per Week: 0 days    Minutes of Exercise per Session: 0 min  Stress: No Stress Concern Present  (07/14/2020)   [place]    Feeling of Stress : Not at all  Social Connections: Moderately Isolated (07/14/2020)   Social Connection and Isolation Panel [NHANES]    Frequency of Communication with Friends and Family: More than three times a week    Frequency of Social Gatherings with Friends and Family: Three times a week    Attends Religious Services: Never    Active Member of Clubs or Organizations: No    Attends Archivist Meetings: Never    Marital Status: Married  Human resources officer Violence: Not At Risk (07/14/2020)   Humiliation, Afraid, Rape, and Kick questionnaire    Fear of Current or Ex-Partner: No    Emotionally Abused: No    Physically Abused: No    Sexually Abused: No    FAMILY HISTORY:  Family History  Problem Relation Age of Onset   Throat cancer Father        dx in his 45s and again in his 86s; pipe and cigar smoker   Neuropathy Brother    Prostate cancer Brother 10   Melanoma Brother        dx in his 43s   Cancer Paternal Uncle        smoking related cancer   Heart attack Paternal Grandfather    Stomach cancer Cousin 71   Colon cancer Neg Hx     CURRENT MEDICATIONS:  Current Outpatient Medications  Medication Sig Dispense Refill   ALPRAZolam (XANAX) 0.5 MG tablet Take 0.5 mg by mouth daily as needed for anxiety.     Apoaequorin (PREVAGEN EXTRA STRENGTH) 20 MG CAPS Take 1 capsule by mouth daily.     aspirin EC 81 MG tablet Take 81 mg by mouth daily.     B Complex Vitamins (VITAMIN B COMPLEX PO) Take 1 tablet by mouth at bedtime.      cholecalciferol (VITAMIN D3) 25 MCG (1000 UNIT) tablet Take 1,000 Units by mouth 2 (two) times daily.     citalopram (CELEXA) 20 MG tablet Take 10 mg by mouth daily.      clopidogrel (PLAVIX) 75 MG tablet TAKE 1 TABLET BY MOUTH ONCE DAILY. 30 tablet 6   fluticasone (FLONASE) 50 MCG/ACT nasal spray Place 1 spray into both nostrils daily.     folic acid  (FOLVITE) 1 MG tablet TAKE (1) TABLET BY MOUTH ONCE DAILY . (Patient taking differently:  Take 1 mg by mouth daily.) 30 tablet 0   ibuprofen (ADVIL,MOTRIN) 200 MG tablet Take 400 mg by mouth daily as needed for headache or moderate pain.     latanoprost (XALATAN) 0.005 % ophthalmic solution 1 DROP IN EACH EYE AT BEDTIME. 2.5 mL 12   loperamide (IMODIUM) 2 MG capsule Take 1 capsule (2 mg total) by mouth as needed for diarrhea or loose stools (maximum 16 mg per day). 30 capsule 0   losartan (COZAAR) 50 MG tablet Take 25 mg by mouth 2 (two) times daily.      metoprolol (LOPRESSOR) 50 MG tablet Take 50 mg by mouth 2 (two) times daily.     montelukast (SINGULAIR) 10 MG tablet Take 10 mg by mouth at bedtime.     Multiple Vitamin (MULTIVITAMIN WITH MINERALS) TABS tablet Take 1 tablet by mouth daily.     nitroGLYCERIN (NITROSTAT) 0.4 MG SL tablet PLACE ONE (1) TABLET UNDER TONGUE EVERY 5 MINUTES UP TO (3) DOSES AS NEEDED FOR CHEST PAIN. 25 tablet 3   ondansetron (ZOFRAN) 4 MG tablet Take 1 tablet (4 mg total) by mouth every 6 (six) hours as needed for nausea or vomiting. 20 tablet 0   pantoprazole (PROTONIX) 40 MG tablet TAKE (1) TABLET TWICE DAILY. 60 tablet 5   pravastatin (PRAVACHOL) 40 MG tablet TAKE 1 TABLET BY MOUTH AT BEDTIME. 90 tablet 3   vitamin B-12 (CYANOCOBALAMIN) 1000 MCG tablet Take by mouth daily.      memantine (NAMENDA) 5 MG tablet Take 1 tablet (5 mg total) by mouth 2 (two) times daily. 60 tablet 11   No current facility-administered medications for this visit.    ALLERGIES:  Allergies  Allergen Reactions   Shellfish Allergy Anaphylaxis   Sulfonamide Derivatives Diarrhea and Nausea Only    PHYSICAL EXAM:  Performance status (ECOG): 1 - Symptomatic but completely ambulatory  Vitals:   09/08/22 1151  BP: (!) 165/65  Pulse: 61  Resp: 18  Temp: 97.8 F (36.6 C)  SpO2: 100%   Wt Readings from Last 3 Encounters:  09/08/22 205 lb 6.4 oz (93.2 kg)  05/17/22 201 lb 12.8 oz  (91.5 kg)  02/24/22 214 lb 8 oz (97.3 kg)   Physical Exam Vitals reviewed.  Constitutional:      Appearance: Normal appearance.  Cardiovascular:     Rate and Rhythm: Normal rate and regular rhythm.     Pulses: Normal pulses.     Heart sounds: Normal heart sounds.  Pulmonary:     Effort: Pulmonary effort is normal.     Breath sounds: Normal breath sounds.  Abdominal:     Palpations: Abdomen is soft. There is no hepatomegaly, splenomegaly or mass.     Tenderness: There is no abdominal tenderness.  Lymphadenopathy:     Cervical: No cervical adenopathy.     Right cervical: No superficial, deep or posterior cervical adenopathy.    Left cervical: No superficial, deep or posterior cervical adenopathy.     Upper Body:     Right upper body: No supraclavicular or axillary adenopathy.     Left upper body: No supraclavicular or axillary adenopathy.     Lower Body: No right inguinal adenopathy. No left inguinal adenopathy.  Neurological:     General: No focal deficit present.     Mental Status: He is alert and oriented to person, place, and time.  Psychiatric:        Mood and Affect: Mood normal.        Behavior:  Behavior normal.      LABORATORY DATA:  I have reviewed the labs as listed.     Latest Ref Rng & Units 09/01/2022   11:01 AM 02/17/2022   10:08 AM 08/04/2021    1:29 PM  CBC  WBC 4.0 - 10.5 K/uL 14.1  13.2  15.3   Hemoglobin 13.0 - 17.0 g/dL 14.0  14.1  14.5   Hematocrit 39.0 - 52.0 % 42.6  42.6  43.4   Platelets 150 - 400 K/uL 345  354  402       Latest Ref Rng & Units 09/01/2022   11:01 AM 02/17/2022   10:08 AM 08/04/2021    1:29 PM  CMP  Glucose 70 - 99 mg/dL 101  114  81   BUN 8 - 23 mg/dL '14  15  13   '$ Creatinine 0.61 - 1.24 mg/dL 1.02  1.09  1.03   Sodium 135 - 145 mmol/L 140  141  138   Potassium 3.5 - 5.1 mmol/L 4.0  4.0  3.9   Chloride 98 - 111 mmol/L 107  110  107   CO2 22 - 32 mmol/L '26  25  25   '$ Calcium 8.9 - 10.3 mg/dL 8.7  8.6  8.4   Total Protein 6.5 -  8.1 g/dL 6.5  6.3  6.4   Total Bilirubin 0.3 - 1.2 mg/dL 1.2  0.8  0.6   Alkaline Phos 38 - 126 U/L 61  59  59   AST 15 - 41 U/L '21  19  21   '$ ALT 0 - 44 U/L '19  18  20     '$ DIAGNOSTIC IMAGING:  I have independently reviewed the scans and discussed with the patient. No results found.   ASSESSMENT:  1.  Stage 0 CLL: - He was diagnosed by flow on 12/13/2014   2.  Stage III ascending colon cancer: - He had 2 out of 20 lymph nodes positive. -Status post right hemicolectomy on 02/29/2012, could not tolerate adjuvant Xeloda. - EGD showed multiple benign gastric polyps, otherwise within normal limits. -He is completed 5 years of surveillance.  No more scans needed at this time. -He had a colonoscopy on 11/10/2017 which showed radiation proctitis changes.  Diverticulosis in the sigmoid colon.  One polyp in the ascending colon which was removed     3.  Prostate cancer: -Underwent radiation therapy at [facility] in 2011. -He also underwent a TURP x2. - He follows up with Dr. Nevada Crane his urologist in [facility].   PLAN:  1.  Stage 0 CLL: - Denies any fevers, night sweats or weight loss in the last 6 months. - Physical examination did not reveal any palpable adenopathy or organomegaly. - No infections reported in the last 6 months. - Uses cane and walker for ambulation and had a couple of falls in the last 6 months, from tripping over. - No indication for treatment at this time.  RTC 6 months for follow-up with repeat labs and exam.   2.  Stage III ascending colon cancer: - No change in bowel habits.  Latest CEA is 1.4.   3.  Prostate cancer: - Last PSA was 0.11.    Orders placed this encounter:  No orders of the defined types were placed in this encounter.    Derek Jack, MD [place] [phone]

## 2022-09-08 NOTE — Patient Instructions (Addendum)
[place] at [facility] Discharge Instructions   You were seen and examined today by Dr. Delton Coombes.  He reviewed the results of your lab work which are normal/stable.   We will see you back in 6 months. We will repeat lab work prior to this visit.    Thank you for choosing [place] at [facility] to provide your oncology and hematology care.  To afford each patient quality time with our provider, please arrive at least 15 minutes before your scheduled appointment time.   If you have a lab appointment with the [facility] please come in thru the Main Entrance and check in at the main information desk.  You need to re-schedule your appointment should you arrive 10 or more minutes late.  We strive to give you quality time with our providers, and arriving late affects you and other patients whose appointments are after yours.  Also, if you no show three or more times for appointments you may be dismissed from the clinic at the providers discretion.     Again, thank you for choosing [facility].  Our hope is that these requests will decrease the amount of time that you wait before being seen by our physicians.       _____________________________________________________________  Should you have questions after your visit to [facility], please contact our office at [phone] and follow the prompts.  Our office hours are 8:00 a.m. and 4:30 p.m. Monday - Friday.  Please note that voicemails left after 4:00 p.m. may not be returned until the following business day.  We are closed weekends and major holidays.  You do have access to a nurse 24-7, just call the main number to the clinic [phone] and do not press any options, hold on the line and a nurse will answer the phone.    For prescription refill requests, have your pharmacy contact our office and allow 72 hours.    Due to Covid, you will need to wear a mask upon  entering the hospital. If you do not have a mask, a mask will be given to you at the Main Entrance upon arrival. For doctor visits, patients may have 1 support person age 59 or older with them. For treatment visits, patients can not have anyone with them due to social distancing guidelines and our immunocompromised population.

## 2022-11-18 ENCOUNTER — Ambulatory Visit: Payer: Medicare PPO | Admitting: Cardiology

## 2022-11-23 ENCOUNTER — Other Ambulatory Visit: Payer: Self-pay | Admitting: Neurology

## 2022-11-25 ENCOUNTER — Ambulatory Visit (INDEPENDENT_AMBULATORY_CARE_PROVIDER_SITE_OTHER): Payer: TRICARE For Life (TFL) | Admitting: Neurology

## 2022-11-25 ENCOUNTER — Encounter: Payer: Self-pay | Admitting: Neurology

## 2022-11-25 VITALS — BP 163/82 | HR 50 | Ht 70.5 in | Wt 206.0 lb

## 2022-11-25 DIAGNOSIS — R4189 Other symptoms and signs involving cognitive functions and awareness: Secondary | ICD-10-CM | POA: Diagnosis not present

## 2022-11-25 DIAGNOSIS — R269 Unspecified abnormalities of gait and mobility: Secondary | ICD-10-CM

## 2022-11-25 MED ORDER — MEMANTINE HCL 10 MG PO TABS: 10.0000 mg | ORAL_TABLET | Freq: Two times a day (BID) | ORAL | 3 refills | Status: DC

## 2022-11-25 MED ORDER — RIVASTIGMINE TARTRATE 1.5 MG PO CAPS: 1.5000 mg | ORAL_CAPSULE | Freq: Two times a day (BID) | ORAL | 3 refills | Status: DC

## 2022-11-25 NOTE — Patient Instructions (Signed)
Will obtain ATN profile to look for Alzheimer disease biomarker Increase Namenda to 10 mg twice daily Start Exelon 1.5 mg twice daily Continue your other medication Continue with physical activity  Follow-up in 1 year or sooner if worse.
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
## 2022-11-29 ENCOUNTER — Other Ambulatory Visit: Payer: Self-pay | Admitting: Cardiology

## 2022-11-30 LAB — ATN PROFILE
A -- Beta-amyloid 42/40 Ratio: 0.095 — ABNORMAL LOW (ref >0.102)
Beta-amyloid 40: 236.46 pg/mL
Beta-amyloid 42: 22.5 pg/mL
N -- NfL, Plasma: 4.72 pg/mL (ref 0.00–11.55)
T -- p-tau181: 1.97 pg/mL — ABNORMAL HIGH (ref 0.00–0.97)

## 2023-01-09 NOTE — Progress Notes (Unsigned)
GI Office Note    Referring Provider: Catalina Lunger, DO Primary Care Physician:  Catalina Lunger, DO  Primary Gastroenterologist: Roetta Sessions, MD   Chief Complaint   No chief complaint on file.   History of Present Illness   David Pugh. is a 88 y.o. male presenting today for further evaluation of rectal bleeding.  Patient was last seen February 2022 for abdominal pain.  Patient has a past medical history significant for CLL, colon and prostate cancer, radiation proctitis, GERD.   Labs from April 2024: Sodium 140, potassium 4.3, creatinine 1.04, albumin 3.3, total bilirubin 0.7, AST 23, ALT 26, alk phos 79, A1c 5.2.   EGD -Fundal gland polyps -No H. pylori.  Colonoscopy March 2019: -Nonbleeding internal hemorrhoids -Radiation proctitis, friable rectal mucosa.  Status post ablation. -Diverticulosis -Polyp removed from the descending colon, tubular adenoma -Right hemicolectomy.  Medications   Current Outpatient Medications  Medication Sig Dispense Refill   ALPRAZolam (XANAX) 0.5 MG tablet Take 0.5 mg by mouth daily as needed for anxiety.     Apoaequorin (PREVAGEN EXTRA STRENGTH) 20 MG CAPS Take 1 capsule by mouth daily.     aspirin EC 81 MG tablet Take 81 mg by mouth daily.     B Complex Vitamins (VITAMIN B COMPLEX PO) Take 1 tablet by mouth at bedtime.      cholecalciferol (VITAMIN D3) 25 MCG (1000 UNIT) tablet Take 1,000 Units by mouth 2 (two) times daily.     citalopram (CELEXA) 20 MG tablet Take 10 mg by mouth daily.      clopidogrel (PLAVIX) 75 MG tablet TAKE 1 TABLET BY MOUTH ONCE DAILY. 30 tablet 6   fluticasone (FLONASE) 50 MCG/ACT nasal spray Place 1 spray into both nostrils daily.     folic acid (FOLVITE) 1 MG tablet TAKE (1) TABLET BY MOUTH ONCE DAILY . (Patient taking differently: Take 1 mg by mouth daily.) 30 tablet 0   ibuprofen (ADVIL,MOTRIN) 200 MG tablet Take 400 mg by mouth daily as needed for headache or moderate pain.     latanoprost  (XALATAN) 0.005 % ophthalmic solution 1 DROP IN EACH EYE AT BEDTIME. 2.5 mL 12   loperamide (IMODIUM) 2 MG capsule Take 1 capsule (2 mg total) by mouth as needed for diarrhea or loose stools (maximum 16 mg per day). 30 capsule 0   losartan (COZAAR) 50 MG tablet Take 25 mg by mouth 2 (two) times daily.      memantine (NAMENDA) 10 MG tablet Take 1 tablet (10 mg total) by mouth 2 (two) times daily. 180 tablet 3   metoprolol (LOPRESSOR) 50 MG tablet Take 50 mg by mouth 2 (two) times daily.     montelukast (SINGULAIR) 10 MG tablet Take 10 mg by mouth at bedtime.     Multiple Vitamin (MULTIVITAMIN WITH MINERALS) TABS tablet Take 1 tablet by mouth daily.     nitroGLYCERIN (NITROSTAT) 0.4 MG SL tablet PLACE ONE (1) TABLET UNDER TONGUE EVERY 5 MINUTES UP TO (3) DOSES AS NEEDED FOR CHEST PAIN. 25 tablet 3   ondansetron (ZOFRAN) 4 MG tablet Take 1 tablet (4 mg total) by mouth every 6 (six) hours as needed for nausea or vomiting. 20 tablet 0   pantoprazole (PROTONIX) 40 MG tablet TAKE (1) TABLET TWICE DAILY. 60 tablet 5   pravastatin (PRAVACHOL) 40 MG tablet TAKE 1 TABLET BY MOUTH AT BEDTIME. 30 tablet 5   rivastigmine (EXELON) 1.5 MG capsule Take 1 capsule (1.5 mg total) by mouth  2 (two) times daily. 180 capsule 3   vitamin B-12 (CYANOCOBALAMIN) 1000 MCG tablet Take by mouth daily.      No current facility-administered medications for this visit.    Allergies   Allergies as of 01/10/2023 - Review Complete 11/25/2022  Allergen Reaction Noted   Shellfish allergy Anaphylaxis 01/13/2012   Sulfonamide derivatives Diarrhea and Nausea Only 04/30/2009     Past Medical History   Past Medical History:  Diagnosis Date   Abnormality of gait 12/18/2013   Arthritis    Basal cell carcinoma of scalp and skin of neck    Bilateral foot-drop 04/02/2019   Biliary dyskinesia    CAD (coronary artery disease) 2012   a. DES x2 in ramus/OM and mid LAD in 2012  b. balloon angioplaty of OM 2015   CLL (chronic  lymphocytic leukemia) (HCC)    Colon cancer (HCC) 2013   Right hemicolectomy, did not tolerate chemotherapy   Colon polyps    tubular adenomas   Diverticula of colon 2010   Essential hypertension    Exposure to Agent Orange    Folic acid deficiency    Gastroparesis 2014   GERD (gastroesophageal reflux disease)    Glaucoma    Heart palpitations 2014   Hiatal hernia 2010   History of anemia as a child    History of kidney stones    Iron deficiency 2013   Myocardial infarction [facility]) 2012   Peripheral neuropathy    Prostate cancer (HCC) 2010   Radiation, surgery   PTSD (post-traumatic stress disorder)    Radiation proctitis    Rosacea     Past Surgical History   Past Surgical History:  Procedure Laterality Date   CARDIAC CATHETERIZATION  02/13/2011   [facility], [facility] cardiology   CATARACT EXTRACTION     2011   CATARACT EXTRACTION W/PHACO Right 08/06/2013   Procedure: CATARACT EXTRACTION PHACO AND INTRAOCULAR LENS PLACEMENT (IOC);  Surgeon: Gemma Payor, MD;  Location: [facility];  Service: Ophthalmology;  Laterality: Right;  CDE:20.96   CHOLECYSTECTOMY     COLONOSCOPY  02/12/2004   Dr. Jena Gauss- L side diverticula, inflammatory  colon polyps   COLONOSCOPY  01/19/2012   RMR: Prominent changes involving the rectal mucosa consistent with radiation-induced proctitis. Multiple colonic  polyps removed as described above. Sigmoid Diverticulosis/ 1.5 x 2 cm relatively flat ulcerated lesion in cecum/path showed adenocarcinoma of the colon. descending colon polyp with tubular adenoma and one fragment of focal high grade dysplasia.    COLONOSCOPY  08/28/2012   XBM:[id number] proctitis. Colonic diverticulosis/Ulcerations at the surgical anastomosis  path: ulcerated colonic mucosa with prolapse changes, tubular adenoma.    COLONOSCOPY N/A 08/02/2013   RMR: Colonic polyps -removed as described above. Colonic diverticulosis. Status post reight hemicolectomy. Radiation proctitis- asymptomatic.     COLONOSCOPY N/A 05/09/2015   Procedure: COLONOSCOPY;  Surgeon: Corbin Ade, MD;  Location: [facility];  Service: Endoscopy;  Laterality: N/A;  1200-moved to [address]5 [address] to notify pt   COLONOSCOPY WITH PROPOFOL N/A 11/10/2017   Procedure: COLONOSCOPY WITH PROPOFOL;  Surgeon: Corbin Ade, MD;  Location: [facility];  Service: Endoscopy;  Laterality: N/A;   CORONARY ANGIOPLASTY  06/17/14   OM1 POBA   CORONARY STENT PLACEMENT  01/2011   2.5x83mm Xience drug-eluting stent in ramus intermedius/OMI vessel, 2.5x69mm Promus Element stent in mid LAD artery   ESOPHAGOGASTRODUODENOSCOPY  10/10/2008   Dr. Maceo Pro hiatal hernia, normal esophagus, normal stomach   ESOPHAGOGASTRODUODENOSCOPY  02/12/2004   Dr. Geni Bers- normal  ESOPHAGOGASTRODUODENOSCOPY N/A 05/09/2015   Procedure: ESOPHAGOGASTRODUODENOSCOPY (EGD);  Surgeon: Corbin Ade, MD;  Location: [facility];  Service: Endoscopy;  Laterality: N/A;   ESOPHAGOGASTRODUODENOSCOPY (EGD) WITH PROPOFOL N/A 11/10/2017   Procedure: ESOPHAGOGASTRODUODENOSCOPY (EGD) WITH PROPOFOL;  Surgeon: Corbin Ade, MD;  Location: [facility];  Service: Endoscopy;  Laterality: N/A;  9:45am   GIVENS CAPSULE STUDY N/A 06/03/2015   Procedure: GIVENS CAPSULE STUDY;  Surgeon: Corbin Ade, MD;  Location: [facility];  Service: Endoscopy;  Laterality: N/A;  0700   HOT HEMOSTASIS  11/10/2017   Procedure: HOT HEMOSTASIS (ARGON PLASMA COAGULATION/BICAP);  Surgeon: Corbin Ade, MD;  Location: [facility];  Service: Endoscopy;;  rectum   KNEE SURGERY     left knee    LEFT HEART CATHETERIZATION WITH CORONARY ANGIOGRAM N/A 06/17/2014   Procedure: LEFT HEART CATHETERIZATION WITH CORONARY ANGIOGRAM;  Surgeon: Kathleene Hazel, MD;  Location: [facility] CATH LAB;  Service: Cardiovascular;  Laterality: N/A;   PARTIAL COLECTOMY  02/29/2012   Procedure: PARTIAL COLECTOMY;  Surgeon: Ernestene Mention, MD;  Location: [facility];  Service: General;  Laterality: Right;   POLYPECTOMY   11/10/2017   Procedure: POLYPECTOMY;  Surgeon: Corbin Ade, MD;  Location: [facility];  Service: Endoscopy;;  gastric colon   PROSTATECTOMY     TONSILLECTOMY     TRANSURETHRAL RESECTION OF PROSTATE     VASECTOMY      Past Family History   Family History  Problem Relation Age of Onset   Throat cancer Father        dx in his 40s and again in his 61s; pipe and cigar smoker   Neuropathy Brother    Prostate cancer Brother 6   Melanoma Brother        dx in his 44s   Cancer Paternal Uncle        smoking related cancer   Heart attack Paternal Grandfather    Stomach cancer Cousin 59   Colon cancer Neg Hx     Past Social History   Social History   Socioeconomic History   Marital status: Married    Spouse name: Not on file   Number of children: 3   Years of education: military   Highest education level: Not on file  Occupational History   Occupation: Retired    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  Tobacco Use   Smoking status: Never   Smokeless tobacco: Never  Vaping Use   Vaping Use: Never used  Substance and Sexual Activity   Alcohol use: Yes    Comment: 1 drink every 6 months   Drug use: No   Sexual activity: Not Currently  Other Topics Concern   Not on file  Social History Narrative   Right handed   Caffeine~1 cup per day    Lives at home with wife    Social Determinants of Health   Financial Resource Strain: Low Risk  (07/14/2020)   Overall Financial Resource Strain (CARDIA)    Difficulty of Paying Living Expenses: Not hard at all  Food Insecurity: No Food Insecurity (07/14/2020)   Hunger Vital Sign    Worried About Running Out of Food in the Last Year: Never true    Ran Out of Food in the Last Year: Never true  Transportation Needs: No Transportation Needs (07/14/2020)   PRAPARE - Administrator, Civil Service (Medical): No    Lack of Transportation (Non-Medical): No  Physical Activity: Inactive (07/14/2020)   Exercise  Vital Sign    Days of Exercise  per Week: 0 days    Minutes of Exercise per Session: 0 min  Stress: No Stress Concern Present (07/14/2020)   [employer] of Occupational Health - Occupational Stress Questionnaire    Feeling of Stress : Not at all  Social Connections: Moderately Isolated (07/14/2020)   Social Connection and Isolation Panel [NHANES]    Frequency of Communication with Friends and Family: More than three times a week    Frequency of Social Gatherings with Friends and Family: Three times a week    Attends Religious Services: Never    Active Member of Clubs or Organizations: No    Attends Banker Meetings: Never    Marital Status: Married  Catering manager Violence: Not At Risk (07/14/2020)   Humiliation, Afraid, Rape, and Kick questionnaire    Fear of Current or Ex-Partner: No    Emotionally Abused: No    Physically Abused: No    Sexually Abused: No    Review of Systems   General: Negative for anorexia, weight loss, fever, chills, fatigue, weakness. ENT: Negative for hoarseness, difficulty swallowing , nasal congestion. CV: Negative for chest pain, angina, palpitations, dyspnea on exertion, peripheral edema.  Respiratory: Negative for dyspnea at rest, dyspnea on exertion, cough, sputum, wheezing.  GI: See history of present illness. GU:  Negative for dysuria, hematuria, urinary incontinence, urinary frequency, nocturnal urination.  Endo: Negative for unusual weight change.     Physical Exam   There were no vitals taken for this visit.   General: Well-nourished, well-developed in no acute distress.  Eyes: No icterus. Mouth: Oropharyngeal mucosa moist and pink , no lesions erythema or exudate. Lungs: Clear to auscultation bilaterally.  Heart: Regular rate and rhythm, no murmurs rubs or gallops.  Abdomen: Bowel sounds are normal, nontender, nondistended, no hepatosplenomegaly or masses,  no abdominal bruits or hernia , no rebound or guarding.  Rectal: ***  Extremities: No lower  extremity edema. No clubbing or deformities. Neuro: Alert and oriented x 4   Skin: Warm and dry, no jaundice.   Psych: Alert and cooperative, normal mood and affect.  Labs   *** Imaging Studies   No results found.  Assessment       PLAN   ***   Leanna Battles. Melvyn Neth, MHS, PA-C [facility]

## 2023-01-10 ENCOUNTER — Encounter: Payer: Self-pay | Admitting: Gastroenterology

## 2023-01-10 ENCOUNTER — Ambulatory Visit (INDEPENDENT_AMBULATORY_CARE_PROVIDER_SITE_OTHER): Payer: Medicare PPO | Admitting: Gastroenterology

## 2023-01-10 VITALS — BP 168/88 | HR 73 | Temp 97.8°F | Ht 70.5 in | Wt 197.4 lb

## 2023-01-10 DIAGNOSIS — K625 Hemorrhage of anus and rectum: Secondary | ICD-10-CM | POA: Diagnosis not present

## 2023-01-10 DIAGNOSIS — K6289 Other specified diseases of anus and rectum: Secondary | ICD-10-CM | POA: Insufficient documentation

## 2023-01-10 NOTE — Patient Instructions (Signed)
Avoid straining with bowel movements. If needed, you can add miralax one capful daily.  Monitor for ongoing rectal bleeding, please call if any further issues.  Return office visit in 3 months.

## 2023-01-20 ENCOUNTER — Other Ambulatory Visit: Payer: Self-pay | Admitting: Cardiology

## 2023-02-17 ENCOUNTER — Encounter (INDEPENDENT_AMBULATORY_CARE_PROVIDER_SITE_OTHER): Payer: Medicare PPO | Admitting: Ophthalmology

## 2023-03-09 ENCOUNTER — Other Ambulatory Visit: Payer: Medicare PPO

## 2023-03-09 ENCOUNTER — Ambulatory Visit: Payer: Medicare PPO | Admitting: Hematology

## 2023-03-09 NOTE — Progress Notes (Signed)
David Pugh [address] [place] [address]    Clinic Day:  03/10/2023  Referring physician: Suzan Slick, MD  Patient Care Team: Catalina Lunger, DO as PCP - General (Family Medicine) Jonelle Sidle, MD as PCP - Cardiology (Cardiology) Jena Gauss Gerrit Friends, MD (Gastroenterology) [name], MD as Medical Oncologist (Hematology) Jene Every, MD as Consulting Physician (Orthopedic Surgery)   ASSESSMENT & PLAN:   Assessment:  1.  Stage 0 CLL: - He was diagnosed by flow on 12/13/2014   2.  Stage III ascending colon cancer: - He had 2 out of 20 lymph nodes positive. -Status post right hemicolectomy on 02/29/2012, could not tolerate adjuvant Xeloda. - EGD showed multiple benign gastric polyps, otherwise within normal limits. -He is completed 5 years of surveillance.  No more scans needed at this time. -He had a colonoscopy on 11/10/2017 which showed radiation proctitis changes.  Diverticulosis in the sigmoid colon.  One polyp in the ascending colon which was removed   3.  Prostate cancer: -Underwent radiation therapy at [facility] in 2011. -He also underwent a TURP x2. - He follows up with Dr. Margo Aye his urologist in [facility].  Plan:  1.  Stage 0 CLL: - Denies fevers, night sweats.  Lost about 8 pounds in the last 6 months due to decreased taste.  He has abruptly stopped taking Celexa and lost appetite.  He started back again on Celexa. - Denies any infections in the last 6 months. - Physical exam: Mild lymphadenopathy in the left axillary region with no palpable organomegaly. - Reviewed labs from 03/10/2023: Normal LFTs and creatinine.  LDH normal.  CBC shows white count is 17.3 with normal hemoglobin and platelet count.  Differential is predominantly lymphocytes. - No indication for treatment at this time.  RTC 6 months for follow-up with repeat labs.       Orders Placed This Encounter  Procedures   CBC with  Differential/Platelet    Standing Status:   Future    Standing Expiration Date:   03/09/2024    Order Specific Question:   Release to patient    Answer:   Immediate   Comprehensive metabolic panel    Standing Status:   Future    Standing Expiration Date:   03/09/2024    Order Specific Question:   Release to patient    Answer:   Immediate   Lactate dehydrogenase    Standing Status:   Future    Standing Expiration Date:   03/09/2024    Order Specific Question:   Release to patient    Answer:   Immediate      I,[name],acting as a scribe for [name], MD.,have documented all relevant documentation on the behalf of [name], MD,as directed by  [name], MD while in the presence of [name], MD.   I, [name] MD, have reviewed the above documentation for accuracy and completeness, and I agree with the above.   [name], MD   7/11/20244:39 PM  CHIEF COMPLAINT:   Diagnosis: CLL   Cancer Staging  Cancer of ascending colon [facility]) Staging form: Colon and Rectum, AJCC 7th Edition - Clinical: Stage IIIB (T3, N1c, M0) - Signed by Ellouise Newer, PA on 05/02/2012    Prior Therapy: intermittent feraheme (last infusion 01/03/2015)   Current Therapy:  surveillance    HISTORY OF PRESENT ILLNESS:   Oncology History  Cancer of ascending colon (HCC)  02/29/2012 Procedure  laparoscopic assisted R colectomy   03/09/2012 Miscellaneous   Iron deficiency and folic acid deficiency   03/29/2012 Initial Diagnosis   Cancer of ascending colon, CEA on 01/19/2012 was WNL   08/02/2013 Procedure   Ileocolonoscopy with snare polypectomy   06/11/2014 Imaging   CT abdomen/pelvis with no findings to suggest metastatic disease. Diverticulosis   06/11/2016 Imaging   CT Abdomen/Pelvis with no findings to suggest recurrent or metastatic disease. Prostatomegaly.       INTERVAL HISTORY:   David Pugh is a 88 y.o. male presenting to  clinic today for follow up of CLL. He was last seen by me on 09/08/22.  Today, he states that he is doing well overall. His appetite level is at 75%. His energy level is at 25%.  PAST MEDICAL HISTORY:   Past Medical History: Past Medical History:  Diagnosis Date   Abnormality of gait 12/18/2013   Arthritis    Basal cell carcinoma of scalp and skin of neck    Bilateral foot-drop 04/02/2019   Biliary dyskinesia    CAD (coronary artery disease) 2012   a. DES x2 in ramus/OM and mid LAD in 2012  b. balloon angioplaty of OM 2015   CLL (chronic lymphocytic leukemia) (HCC)    Colon cancer (HCC) 2013   Right hemicolectomy, did not tolerate chemotherapy   Colon polyps    tubular adenomas   Diverticula of colon 2010   Essential hypertension    Exposure to Agent Orange    Folic acid deficiency    Gastroparesis 2014   GERD (gastroesophageal reflux disease)    Glaucoma    Heart palpitations 2014   Hiatal hernia 2010   History of anemia as a child    History of kidney stones    Iron deficiency 2013   Myocardial infarction [facility]) 2012   Peripheral neuropathy    Prostate cancer (HCC) 2010   Radiation, surgery   PTSD (post-traumatic stress disorder)    Radiation proctitis    Rosacea     Surgical History: Past Surgical History:  Procedure Laterality Date   CARDIAC CATHETERIZATION  02/13/2011   [facility], [facility] cardiology   CATARACT EXTRACTION     2011   CATARACT EXTRACTION W/PHACO Right 08/06/2013   Procedure: CATARACT EXTRACTION PHACO AND INTRAOCULAR LENS PLACEMENT (IOC);  Surgeon: Gemma Payor, MD;  Location: [facility];  Service: Ophthalmology;  Laterality: Right;  CDE:20.96   CHOLECYSTECTOMY     COLONOSCOPY  02/12/2004   Dr. Jena Gauss- L side diverticula, inflammatory  colon polyps   COLONOSCOPY  01/19/2012   RMR: Prominent changes involving the rectal mucosa consistent with radiation-induced proctitis. Multiple colonic  polyps removed as described above. Sigmoid Diverticulosis/ 1.5 x 2 cm  relatively flat ulcerated lesion in cecum/path showed adenocarcinoma of the colon. descending colon polyp with tubular adenoma and one fragment of focal high grade dysplasia.    COLONOSCOPY  08/28/2012   ZOX:[id number] proctitis. Colonic diverticulosis/Ulcerations at the surgical anastomosis  path: ulcerated colonic mucosa with prolapse changes, tubular adenoma.    COLONOSCOPY N/A 08/02/2013   RMR: Colonic polyps -removed as described above. Colonic diverticulosis. Status post reight hemicolectomy. Radiation proctitis- asymptomatic.    COLONOSCOPY N/A 05/09/2015   Procedure: COLONOSCOPY;  Surgeon: Corbin Ade, MD;  Location: [facility];  Service: Endoscopy;  Laterality: N/A;  1200-moved to [address] to notify pt   COLONOSCOPY WITH PROPOFOL N/A 11/10/2017   Procedure: COLONOSCOPY WITH PROPOFOL;  Surgeon: Corbin Ade, MD;  Location: [facility];  Service:  Endoscopy;  Laterality: N/A;   CORONARY ANGIOPLASTY  06/17/14   OM1 POBA   CORONARY STENT PLACEMENT  01/2011   2.5x25mm Xience drug-eluting stent in ramus intermedius/OMI vessel, 2.5x44mm Promus Element stent in mid LAD artery   ESOPHAGOGASTRODUODENOSCOPY  10/10/2008   Dr. Maceo Pro hiatal hernia, normal esophagus, normal stomach   ESOPHAGOGASTRODUODENOSCOPY  02/12/2004   Dr. Geni Bers- normal    ESOPHAGOGASTRODUODENOSCOPY N/A 05/09/2015   Procedure: ESOPHAGOGASTRODUODENOSCOPY (EGD);  Surgeon: Corbin Ade, MD;  [facility]: [facility];  Service: Endoscopy;  Laterality: N/A;   ESOPHAGOGASTRODUODENOSCOPY (EGD) WITH PROPOFOL N/A 11/10/2017   Procedure: ESOPHAGOGASTRODUODENOSCOPY (EGD) WITH PROPOFOL;  Surgeon: Corbin Ade, MD;  [facility];  Service: Endoscopy;  Laterality: N/A;  9:45am   GIVENS CAPSULE STUDY N/A 06/03/2015   Procedure: GIVENS CAPSULE STUDY;  Surgeon: Corbin Ade, MD;  Location: [facility];  Service: Endoscopy;  Laterality: N/A;  0700   HOT HEMOSTASIS  11/10/2017   Procedure: HOT HEMOSTASIS (ARGON PLASMA  COAGULATION/BICAP);  Surgeon: Corbin Ade, MD;  Location: [facility];  Service: Endoscopy;;  rectum   KNEE SURGERY     left knee    LEFT HEART CATHETERIZATION WITH CORONARY ANGIOGRAM N/A 06/17/2014   Procedure: LEFT HEART CATHETERIZATION WITH CORONARY ANGIOGRAM;  Surgeon: Kathleene Hazel, MD;  Location: [facility] CATH LAB;  Service: Cardiovascular;  Laterality: N/A;   PARTIAL COLECTOMY  02/29/2012   Procedure: PARTIAL COLECTOMY;  Surgeon: Ernestene Mention, MD;  Location: [facility];  Service: General;  Laterality: Right;   POLYPECTOMY  11/10/2017   Procedure: POLYPECTOMY;  Surgeon: Corbin Ade, MD;  [facility];  Service: Endoscopy;;  gastric colon   PROSTATECTOMY     TONSILLECTOMY     TRANSURETHRAL RESECTION OF PROSTATE     VASECTOMY      Social History: Social History   Socioeconomic History   Marital status: Married    Spouse name: Not on file   Number of children: 3   Years of education: military   Highest education level: Not on file  Occupational History   Occupation: Retired    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  Tobacco Use   Smoking status: Never   Smokeless tobacco: Never  Vaping Use   Vaping status: Never Used  Substance and Sexual Activity   Alcohol use: Yes    Comment: 1 drink every 6 months   Drug use: No   Sexual activity: Not Currently  Other Topics Concern   Not on file  Social History Narrative   Right handed   Caffeine~1 cup per day    Lives at home with wife    Social Determinants of Health   Financial Resource Strain: Low Risk  (12/15/2022)   Received [facility], [facility]   Overall Financial Resource Strain (CARDIA)    Difficulty of Paying Living Expenses: Not hard at all  Food Insecurity: No Food Insecurity (12/15/2022)   Received from [facility], [facility]   Hunger Vital Sign    Worried About Running Out of Food in the Last Year: Never true    Ran Out of Food in the Last Year: Never true  Transportation Needs: No  Transportation Needs (12/15/2022)   Received from [facility], [facility]   PRAPARE - Transportation    Lack of Transportation (Medical): No    Lack of Transportation (Non-Medical): No  Physical Activity: Inactive (07/14/2020)   Exercise Vital Sign    Days of  Exercise per Week: 0 days    Minutes of Exercise per Session: 0 min  Stress: No Stress Concern Present (12/15/2022)   Received from [facility], [facility]   [facility] - Occupational Stress Questionnaire    Feeling of Stress : Not at all  Social Connections: Unknown (01/11/2022)   Received from [facility]   Social Network    Social Network: Not on file  Intimate Partner Violence: Unknown (12/03/2021)   Received from [facility]   HITS    Physically Hurt: Not on file    Insult or Talk Down To: Not on file    Threaten Physical Harm: Not on file    Scream or Curse: Not on file    Family History: Family History  Problem Relation Age of Onset   Throat cancer Father        dx in his 47s and again in his 4s; pipe and cigar smoker   Neuropathy Brother    Prostate cancer Brother 35   Melanoma Brother        dx in his 24s   Cancer Paternal Uncle        smoking related cancer   Heart attack Paternal Grandfather    Stomach cancer Cousin 7   Colon cancer Neg Hx     Current Medications:  Current Outpatient Medications:    ALPRAZolam (XANAX) 0.5 MG tablet, Take 0.5 mg by mouth daily as needed for anxiety., Disp: , Rfl:    Apoaequorin (PREVAGEN EXTRA STRENGTH) 20 MG CAPS, Take 1 capsule by mouth daily., Disp: , Rfl:    B Complex Vitamins (VITAMIN B COMPLEX PO), Take 1 tablet by mouth at bedtime. , Disp: , Rfl:    cholecalciferol (VITAMIN D3) 25 MCG (1000 UNIT) tablet, Take 1,000 Units by mouth 2 (two) times daily., Disp: , Rfl:    citalopram (CELEXA) 10 MG tablet, Take 10 mg by mouth daily., Disp: , Rfl:    clopidogrel (PLAVIX) 75 MG tablet, TAKE 1 TABLET BY MOUTH ONCE DAILY.,  Disp: 30 tablet, Rfl: 6   fluticasone (FLONASE) 50 MCG/ACT nasal spray, Place 1 spray into both nostrils daily., Disp: , Rfl:    folic acid (FOLVITE) 1 MG tablet, TAKE (1) TABLET BY MOUTH ONCE DAILY . (Patient taking differently: Take 1 mg by mouth daily.), Disp: 30 tablet, Rfl: 0   ibuprofen (ADVIL,MOTRIN) 200 MG tablet, Take 400 mg by mouth daily as needed for headache or moderate pain., Disp: , Rfl:    latanoprost (XALATAN) 0.005 % ophthalmic solution, 1 DROP IN EACH EYE AT BEDTIME., Disp: 2.5 mL, Rfl: 12   loperamide (IMODIUM) 2 MG capsule, Take 1 capsule (2 mg total) by mouth as needed for diarrhea or loose stools (maximum 16 mg per day)., Disp: 30 capsule, Rfl: 0   losartan (COZAAR) 50 MG tablet, Take 25 mg by mouth 2 (two) times daily. , Disp: , Rfl:    memantine (NAMENDA) 10 MG tablet, Take 1 tablet (10 mg total) by mouth 2 (two) times daily., Disp: 180 tablet, Rfl: 3   metoprolol (LOPRESSOR) 50 MG tablet, Take 50 mg by mouth 2 (two) times daily., Disp: , Rfl:    montelukast (SINGULAIR) 10 MG tablet, Take 10 mg by mouth at bedtime., Disp: , Rfl:    Multiple Vitamin (MULTIVITAMIN WITH MINERALS) TABS tablet, Take 1 tablet by mouth daily., Disp: , Rfl:    nitroGLYCERIN (NITROSTAT) 0.4 MG SL tablet, PLACE ONE (1) TABLET UNDER TONGUE EVERY  5 MINUTES UP TO (3) DOSES AS NEEDED FOR CHEST PAIN., Disp: 25 tablet, Rfl: 3   pantoprazole (PROTONIX) 40 MG tablet, TAKE (1) TABLET TWICE DAILY. (Patient taking differently: Take 40 mg by mouth daily.), Disp: 60 tablet, Rfl: 5   pravastatin (PRAVACHOL) 40 MG tablet, TAKE 1 TABLET BY MOUTH AT BEDTIME., Disp: 30 tablet, Rfl: 5   rivastigmine (EXELON) 1.5 MG capsule, Take 1 capsule (1.5 mg total) by mouth 2 (two) times daily., Disp: 180 capsule, Rfl: 3   vitamin B-12 (CYANOCOBALAMIN) 1000 MCG tablet, Take by mouth daily. , Disp: , Rfl:    Allergies: Allergies  Allergen Reactions   Shellfish Allergy Anaphylaxis   Sulfonamide Derivatives Diarrhea and Nausea  Only    REVIEW OF SYSTEMS:   Review of Systems  Constitutional:  Positive for fatigue. Negative for chills and fever.  HENT:   Negative for lump/mass, mouth sores, nosebleeds, sore throat and trouble swallowing.   Eyes:  Negative for eye problems.  Respiratory:  Negative for cough and shortness of breath.   Cardiovascular:  Negative for chest pain, leg swelling and palpitations.  Gastrointestinal:  Positive for constipation and diarrhea. Negative for abdominal pain, nausea and vomiting.  Genitourinary:  Negative for bladder incontinence, difficulty urinating, dysuria, frequency, hematuria and nocturia.   Musculoskeletal:  Negative for arthralgias, back pain, flank pain, myalgias and neck pain.  Skin:  Negative for itching and rash.  Neurological:  Positive for numbness. Negative for dizziness and headaches.  Hematological:  Does not bruise/bleed easily.  Psychiatric/Behavioral:  Positive for sleep disturbance. Negative for depression and suicidal ideas. The patient is nervous/anxious.   All other systems reviewed and are negative.    VITALS:   Blood pressure (!) 142/68, pulse (!) 59, temperature 98.5 F (36.9 C), resp. rate 16, weight 196 lb 14.4 oz (89.3 kg), SpO2 97%.  Wt Readings from Last 3 Encounters:  03/10/23 196 lb 14.4 oz (89.3 kg)  01/10/23 197 lb 6.4 oz (89.5 kg)  11/25/22 206 lb (93.4 kg)    Body mass index is 27.85 kg/m.  Performance status (ECOG): 1 - Symptomatic but completely ambulatory  PHYSICAL EXAM:   Physical Exam Vitals and nursing note reviewed. Exam conducted with a chaperone present.  Constitutional:      Appearance: Normal appearance.  Cardiovascular:     Rate and Rhythm: Normal rate and regular rhythm.     Pulses: Normal pulses.     Heart sounds: Normal heart sounds.  Pulmonary:     Effort: Pulmonary effort is normal.     Breath sounds: Normal breath sounds.  Abdominal:     Palpations: Abdomen is soft. There is no hepatomegaly, splenomegaly  or mass.     Tenderness: There is no abdominal tenderness.  Musculoskeletal:     Right lower leg: No edema.     Left lower leg: No edema.  Lymphadenopathy:     Cervical: No cervical adenopathy.     Right cervical: No superficial, deep or posterior cervical adenopathy.    Left cervical: No superficial, deep or posterior cervical adenopathy.     Upper Body:     Right upper body: No supraclavicular or axillary adenopathy.     Left upper body: No supraclavicular or axillary adenopathy.  Neurological:     General: No focal deficit present.     Mental Status: He is alert and oriented to person, place, and time.  Psychiatric:        Mood and Affect: Mood normal.  Behavior: Behavior normal.     LABS:      Latest Ref Rng & Units 03/10/2023    1:17 PM 09/01/2022   11:01 AM 02/17/2022   10:08 AM  CBC  WBC 4.0 - 10.5 K/uL 17.3  14.1  13.2   Hemoglobin 13.0 - 17.0 g/dL 81.1  91.4  78.2   Hematocrit 39.0 - 52.0 % 43.3  42.6  42.6   Platelets 150 - 400 K/uL 350  345  354       Latest Ref Rng & Units 03/10/2023    1:17 PM 09/01/2022   11:01 AM 02/17/2022   10:08 AM  CMP  Glucose 70 - 99 mg/dL 956  213  086   BUN 8 - 23 mg/dL 16  14  15    Creatinine 0.61 - 1.24 mg/dL 5.78  4.69  6.29   Sodium 135 - 145 mmol/L 140  140  141   Potassium 3.5 - 5.1 mmol/L 4.3  4.0  4.0   Chloride 98 - 111 mmol/L 109  107  110   CO2 22 - 32 mmol/L 24  26  25    Calcium 8.9 - 10.3 mg/dL 8.9  8.7  8.6   Total Protein 6.5 - 8.1 g/dL 6.5  6.5  6.3   Total Bilirubin 0.3 - 1.2 mg/dL 1.0  1.2  0.8   Alkaline Phos 38 - 126 U/L 62  61  59   AST 15 - 41 U/L 23  21  19    ALT 0 - 44 U/L 20  19  18       Lab Results  Component Value Date   CEA1 1.4 09/01/2022   CEA 1.6 07/11/2015   /  CEA  Date Value Ref Range Status  09/01/2022 1.4 0.0 - 4.7 ng/mL Final    Comment:    (NOTE)                             Nonsmokers          <3.9                             Smokers             <5.6 [employer]  Electrochemiluminescence Immunoassay (ECLIA) Values obtained with different assay methods or kits cannot be used interchangeably.  Results cannot be interpreted as absolute evidence of the presence or absence of malignant disease. Performed At: [facility] [address] [place], [place] [id number] Jolene Schimke MD [id number]   07/11/2015 1.6 0.0 - 4.7 ng/mL Final    Comment:    (NOTE)       Roche ECLIA methodology       Nonsmokers  <3.9                                     Smokers     <5.6 Performed At: [facility] [address] [place], [place] [id number] Mila Homer MD [id number]    No results found for: "PSA1" No results found for: "CAN199" No results found for: "CAN125"  No results found for: "TOTALPROTELP", "ALBUMINELP", "A1GS", "A2GS", "BETS", "BETA2SER", "GAMS", "MSPIKE", "SPEI" Lab Results  Component Value Date   TIBC 327 01/13/2021   TIBC 336 07/09/2020   TIBC 273 05/30/2013   FERRITIN 67 01/13/2021  FERRITIN 52 07/09/2020   FERRITIN 71 09/27/2016   IRONPCTSAT 47 (H) 01/13/2021   IRONPCTSAT 34 07/09/2020   IRONPCTSAT 74 (H) 05/30/2013   Lab Results  Component Value Date   LDH 147 03/10/2023   LDH 111 09/01/2022   LDH 122 02/17/2022     STUDIES:   No results found.

## 2023-03-10 ENCOUNTER — Inpatient Hospital Stay: Payer: Medicare PPO | Attending: Hematology

## 2023-03-10 ENCOUNTER — Encounter: Payer: Self-pay | Admitting: Hematology

## 2023-03-10 ENCOUNTER — Inpatient Hospital Stay (HOSPITAL_BASED_OUTPATIENT_CLINIC_OR_DEPARTMENT_OTHER): Payer: Medicare PPO | Admitting: Hematology

## 2023-03-10 VITALS — BP 142/68 | HR 59 | Temp 98.5°F | Resp 16 | Wt 196.9 lb

## 2023-03-10 DIAGNOSIS — Z85038 Personal history of other malignant neoplasm of large intestine: Secondary | ICD-10-CM | POA: Diagnosis not present

## 2023-03-10 DIAGNOSIS — C9110 Chronic lymphocytic leukemia of B-cell type not having achieved remission: Secondary | ICD-10-CM

## 2023-03-10 DIAGNOSIS — Z8719 Personal history of other diseases of the digestive system: Secondary | ICD-10-CM | POA: Diagnosis not present

## 2023-03-10 DIAGNOSIS — Z8249 Family history of ischemic heart disease and other diseases of the circulatory system: Secondary | ICD-10-CM | POA: Insufficient documentation

## 2023-03-10 DIAGNOSIS — Z9221 Personal history of antineoplastic chemotherapy: Secondary | ICD-10-CM | POA: Diagnosis not present

## 2023-03-10 DIAGNOSIS — Z8546 Personal history of malignant neoplasm of prostate: Secondary | ICD-10-CM | POA: Insufficient documentation

## 2023-03-10 DIAGNOSIS — Z923 Personal history of irradiation: Secondary | ICD-10-CM | POA: Diagnosis not present

## 2023-03-10 LAB — CBC WITH DIFFERENTIAL/PLATELET
Abs Immature Granulocytes: 0 10*3/uL (ref 0.00–0.07)
Basophils Absolute: 0 10*3/uL (ref 0.0–0.1)
Basophils Relative: 0 %
Eosinophils Absolute: 0.3 10*3/uL (ref 0.0–0.5)
Eosinophils Relative: 2 %
HCT: 43.3 % (ref 39.0–52.0)
Hemoglobin: 14.2 g/dL (ref 13.0–17.0)
Lymphocytes Relative: 59 %
Lymphs Abs: 10.2 10*3/uL — ABNORMAL HIGH (ref 0.7–4.0)
MCH: 31.4 pg (ref 26.0–34.0)
MCHC: 32.8 g/dL (ref 30.0–36.0)
MCV: 95.8 fL (ref 80.0–100.0)
Monocytes Absolute: 0.7 10*3/uL (ref 0.1–1.0)
Monocytes Relative: 4 %
Neutro Abs: 6.1 10*3/uL (ref 1.7–7.7)
Neutrophils Relative %: 35 %
Platelets: 350 10*3/uL (ref 150–400)
RBC: 4.52 MIL/uL (ref 4.22–5.81)
RDW: 12.8 % (ref 11.5–15.5)
WBC: 17.3 10*3/uL — ABNORMAL HIGH (ref 4.0–10.5)
nRBC: 0 % (ref 0.0–0.2)

## 2023-03-10 LAB — COMPREHENSIVE METABOLIC PANEL
ALT: 20 U/L (ref 0–44)
AST: 23 U/L (ref 15–41)
Albumin: 3.5 g/dL (ref 3.5–5.0)
Alkaline Phosphatase: 62 U/L (ref 38–126)
Anion gap: 7 (ref 5–15)
BUN: 16 mg/dL (ref 8–23)
CO2: 24 mmol/L (ref 22–32)
Calcium: 8.9 mg/dL (ref 8.9–10.3)
Chloride: 109 mmol/L (ref 98–111)
Creatinine, Ser: 1.22 mg/dL (ref 0.61–1.24)
GFR, Estimated: 58 mL/min — ABNORMAL LOW (ref >60)
Glucose, Bld: 110 mg/dL — ABNORMAL HIGH (ref 70–99)
Potassium: 4.3 mmol/L (ref 3.5–5.1)
Sodium: 140 mmol/L (ref 135–145)
Total Bilirubin: 1 mg/dL (ref 0.3–1.2)
Total Protein: 6.5 g/dL (ref 6.5–8.1)

## 2023-03-10 LAB — LACTATE DEHYDROGENASE: LDH: 147 U/L (ref 98–192)

## 2023-03-10 NOTE — Patient Instructions (Addendum)
[facility]  Discharge Instructions  You were seen and examined today by Dr. Ellin Saba.  Dr. Ellin Saba discussed your most recent lab work which revealed that everything looks good and stable.  Call us for sooner appointment if having drenching night sweats regularly, having weight loss of around 20 pounds.  Follow-up as scheduled in 6 months.    Thank you for choosing [facility] to provide your oncology and hematology care.   To afford each patient quality time with our provider, please arrive at least 15 minutes before your scheduled appointment time. You may need to reschedule your appointment if you arrive late (10 or more minutes). Arriving late affects you and other patients whose appointments are after yours.  Also, if you miss three or more appointments without notifying the office, you may be dismissed from the clinic at the provider's discretion.    Again, thank you for choosing [facility].  Our hope is that these requests will decrease the amount of time that you wait before being seen by our physicians.   If you have a lab appointment with the Cancer Center - please note that after April 8th, all labs will be drawn in the cancer center.  You do not have to check in or register with the main entrance as you have in the past but will complete your check-in at the cancer center.            _____________________________________________________________  Should you have questions after your visit to [facility], please contact our office at [phone] and follow the prompts.  Our office hours are 8:00 a.m. to 4:30 p.m. Monday - Thursday and 8:00 a.m. to 2:30 p.m. Friday.  Please note that voicemails left after 4:00 p.m. may not be returned until the following business day.  We are closed weekends and all major holidays.  You do have access to a nurse 24-7, just call the main number to the clinic  [phone] and do not press any options, hold on the line and a nurse will answer the phone.    For prescription refill requests, have your pharmacy contact our office and allow 72 hours.    Masks are no longer required in the cancer centers. If you would like for your care team to wear a mask while they are taking care of you, please let them know. You may have one support person who is at least 86 years old accompany you for your appointments.

## 2023-03-15 ENCOUNTER — Encounter: Payer: Self-pay | Admitting: Gastroenterology

## 2023-04-04 ENCOUNTER — Ambulatory Visit: Payer: Medicare PPO | Attending: Cardiology | Admitting: Cardiology

## 2023-04-04 ENCOUNTER — Encounter: Payer: Self-pay | Admitting: Cardiology

## 2023-04-04 VITALS — BP 126/76 | HR 59 | Wt 196.8 lb

## 2023-04-04 DIAGNOSIS — I25119 Atherosclerotic heart disease of native coronary artery with unspecified angina pectoris: Secondary | ICD-10-CM

## 2023-04-04 DIAGNOSIS — E782 Mixed hyperlipidemia: Secondary | ICD-10-CM

## 2023-04-04 DIAGNOSIS — I10 Essential (primary) hypertension: Secondary | ICD-10-CM | POA: Diagnosis not present

## 2023-04-04 MED ORDER — NITROGLYCERIN 0.4 MG SL SUBL: 0.4000 mg | SUBLINGUAL_TABLET | SUBLINGUAL | 3 refills | Status: AC | PRN

## 2023-04-04 NOTE — Patient Instructions (Addendum)

## 2023-04-04 NOTE — Progress Notes (Signed)
    Cardiology Office Note  Date: 04/04/2023   ID: David Harps., DOB [birth year], MRN [id number]  History of Present Illness: David Angelina. is an 88 y.o. male last seen in September 2023.  He is here for a routine visit.  Reports no angina or nitroglycerin use.  He admits that he is less active, does enjoy reading, but he does try to use the elliptical machine for about 30 minutes each morning.  I reviewed his medications.  We discussed getting a refill for fresh bottle of nitroglycerin.  He has not had any bleeding problems on Plavix.  ECG today shows sinus bradycardia with old inferior infarct pattern.  Physical Exam: VS:  BP 126/76   Pulse (!) 59   Wt 196 lb 12.8 oz (89.3 kg)   SpO2 97%   BMI 27.84 kg/m , BMI Body mass index is 27.84 kg/m.  Wt Readings from Last 3 Encounters:  04/04/23 196 lb 12.8 oz (89.3 kg)  03/10/23 196 lb 14.4 oz (89.3 kg)  01/10/23 197 lb 6.4 oz (89.5 kg)    General: Patient appears comfortable at rest. HEENT: Conjunctiva and lids normal. Neck: Supple, no elevated JVP or carotid bruits. Lungs: Clear to auscultation, nonlabored breathing at rest. Cardiac: Regular rate and rhythm, no S3, 1/6 systolic murmur. Extremities: No pitting edema.  ECG:  An ECG dated 05/17/2022 was personally reviewed today and demonstrated:  Sinus bradycardia with LVH and old inferior infarct pattern.  Labwork: March 2023: Cholesterol 132, triglycerides 99, HDL 58, LDL 56 03/10/2023: ALT 20; AST 23; BUN 16; Creatinine, Ser 1.22; Hemoglobin 14.2; Platelets 350; Potassium 4.3; Sodium 140   Other Studies Reviewed Today:  No interval cardiac testing for review today.  Assessment and Plan:  1.  CAD status post inferior infarct in 2012 managed with DES x 2 to the ramus intermedius/OM and mid LAD.  Subsequently underwent balloon angioplasty of the OM in 2015.  He reports no active angina at this time.  Refill provided for fresh bottle of nitroglycerin.  Continue  Plavix, Cozaar, Lopressor, and Pravachol.  2.  Essential hypertension.  Blood pressure is well-controlled today.  No changes made to present regimen.  3.  Mixed hyperlipidemia.  LDL 56 in March 2023.  Continue Pravachol.  Disposition:  Follow up  6 months.  Signed, Jonelle Sidle, M.D., F.A.C.C. [facility]

## 2023-04-18 ENCOUNTER — Ambulatory Visit: Payer: Medicare PPO | Admitting: Gastroenterology

## 2023-04-24 NOTE — Progress Notes (Deleted)
GI Office Note    Referring Provider: Catalina Lunger, DO Primary Care Physician:  Catalina Lunger, DO  Primary Gastroenterologist: Roetta Sessions, MD   Chief Complaint   No chief complaint on file.   History of Present Illness   David Pugh. is a 88 y.o. male presenting today for follow up. Last seen 12/2022 for rectal bleeding. PMH of CLL, colon and prostate cancer, radiation proctitis, GERD.  At time of last ov, he had noted brbpr with wiping 3 times in setting of hard stools. Patient declined colnoscopy at that time preferring to watch and see if symptoms returned.   EGD 10/2017: -Fundal gland polyps -No H. pylori.   Colonoscopy 10/2017: -Nonbleeding internal hemorrhoids -Radiation proctitis, friable rectal mucosa.  Status post ablation. -Diverticulosis -Polyp removed from the descending colon, tubular adenoma -Right hemicolectomy. -no repeat colonoscopy needed due to age, unless develops new symptoms    Medications   Current Outpatient Medications  Medication Sig Dispense Refill   ALPRAZolam (XANAX) 0.5 MG tablet Take 0.5 mg by mouth daily as needed for anxiety.     Apoaequorin (PREVAGEN EXTRA STRENGTH) 20 MG CAPS Take 1 capsule by mouth daily.     B Complex Vitamins (VITAMIN B COMPLEX PO) Take 1 tablet by mouth at bedtime.      cholecalciferol (VITAMIN D3) 25 MCG (1000 UNIT) tablet Take 1,000 Units by mouth 2 (two) times daily.     citalopram (CELEXA) 10 MG tablet Take 10 mg by mouth daily.     clopidogrel (PLAVIX) 75 MG tablet TAKE 1 TABLET BY MOUTH ONCE DAILY. 30 tablet 6   fluticasone (FLONASE) 50 MCG/ACT nasal spray Place 1 spray into both nostrils daily.     folic acid (FOLVITE) 1 MG tablet TAKE (1) TABLET BY MOUTH ONCE DAILY . (Patient taking differently: Take 1 mg by mouth daily.) 30 tablet 0   ibuprofen (ADVIL,MOTRIN) 200 MG tablet Take 400 mg by mouth daily as needed for headache or moderate pain.     latanoprost (XALATAN) 0.005 % ophthalmic  solution 1 DROP IN EACH EYE AT BEDTIME. 2.5 mL 12   loperamide (IMODIUM) 2 MG capsule Take 1 capsule (2 mg total) by mouth as needed for diarrhea or loose stools (maximum 16 mg per day). 30 capsule 0   losartan (COZAAR) 50 MG tablet Take 25 mg by mouth 2 (two) times daily.      memantine (NAMENDA) 10 MG tablet Take 1 tablet (10 mg total) by mouth 2 (two) times daily. 180 tablet 3   metoprolol (LOPRESSOR) 50 MG tablet Take 50 mg by mouth 2 (two) times daily.     montelukast (SINGULAIR) 10 MG tablet Take 10 mg by mouth at bedtime.     Multiple Vitamin (MULTIVITAMIN WITH MINERALS) TABS tablet Take 1 tablet by mouth daily.     nitroGLYCERIN (NITROSTAT) 0.4 MG SL tablet Place 1 tablet (0.4 mg total) under the tongue every 5 (five) minutes x 3 doses as needed for chest pain (if no relief after 3rd dose, proceed to ED or call 911). 25 tablet 3   pantoprazole (PROTONIX) 40 MG tablet TAKE (1) TABLET TWICE DAILY. (Patient taking differently: Take 40 mg by mouth daily.) 60 tablet 5   pravastatin (PRAVACHOL) 40 MG tablet TAKE 1 TABLET BY MOUTH AT BEDTIME. 30 tablet 5   rivastigmine (EXELON) 1.5 MG capsule Take 1 capsule (1.5 mg total) by mouth 2 (two) times daily. 180 capsule 3   vitamin B-12 (CYANOCOBALAMIN) 1000  MCG tablet Take by mouth daily.      No current facility-administered medications for this visit.    Allergies   Allergies as of 04/25/2023 - Review Complete 04/04/2023  Allergen Reaction Noted   Shellfish allergy Anaphylaxis 01/13/2012   Sulfonamide derivatives Diarrhea and Nausea Only 04/30/2009     Past Medical History   Past Medical History:  Diagnosis Date   Abnormality of gait 12/18/2013   Arthritis    Basal cell carcinoma of scalp and skin of neck    Bilateral foot-drop 04/02/2019   Biliary dyskinesia    CAD (coronary artery disease) 2012   a. DES x2 in ramus/OM and mid LAD in 2012  b. balloon angioplaty of OM 2015   CLL (chronic lymphocytic leukemia) (HCC)    Colon cancer  (HCC) 2013   Right hemicolectomy, did not tolerate chemotherapy   Colon polyps    tubular adenomas   Diverticula of colon 2010   Essential hypertension    Exposure to Agent Orange    Folic acid deficiency    Gastroparesis 2014   GERD (gastroesophageal reflux disease)    Glaucoma    Heart palpitations 2014   Hiatal hernia 2010   History of anemia as a child    History of kidney stones    Iron deficiency 2013   Myocardial infarction [facility]) 2012   Peripheral neuropathy    Prostate cancer (HCC) 2010   Radiation, surgery   PTSD (post-traumatic stress disorder)    Radiation proctitis    Rosacea     Past Surgical History   Past Surgical History:  Procedure Laterality Date   CARDIAC CATHETERIZATION  02/13/2011   [facility], [facility] cardiology   CATARACT EXTRACTION     2011   CATARACT EXTRACTION W/PHACO Right 08/06/2013   Procedure: CATARACT EXTRACTION PHACO AND INTRAOCULAR LENS PLACEMENT (IOC);  Surgeon: Gemma Payor, MD;  Location: [facility];  Service: Ophthalmology;  Laterality: Right;  CDE:20.96   CHOLECYSTECTOMY     COLONOSCOPY  02/12/2004   Dr. Jena Gauss- L side diverticula, inflammatory  colon polyps   COLONOSCOPY  01/19/2012   RMR: Prominent changes involving the rectal mucosa consistent with radiation-induced proctitis. Multiple colonic  polyps removed as described above. Sigmoid Diverticulosis/ 1.5 x 2 cm relatively flat ulcerated lesion in cecum/path showed adenocarcinoma of the colon. descending colon polyp with tubular adenoma and one fragment of focal high grade dysplasia.    COLONOSCOPY  08/28/2012   WUX:[id number] proctitis. Colonic diverticulosis/Ulcerations at the surgical anastomosis  path: ulcerated colonic mucosa with prolapse changes, tubular adenoma.    COLONOSCOPY N/A 08/02/2013   RMR: Colonic polyps -removed as described above. Colonic diverticulosis. Status post reight hemicolectomy. Radiation proctitis- asymptomatic.    COLONOSCOPY N/A 05/09/2015   Procedure: COLONOSCOPY;   Surgeon: Corbin Ade, MD;  Location: [facility];  Service: Endoscopy;  Laterality: N/A;  1200-moved to [address]5 [address] to notify pt   COLONOSCOPY WITH PROPOFOL N/A 11/10/2017   Procedure: COLONOSCOPY WITH PROPOFOL;  Surgeon: Corbin Ade, MD;  [facility]: [facility];  Service: Endoscopy;  Laterality: N/A;   CORONARY ANGIOPLASTY  06/17/14   OM1 POBA   CORONARY STENT PLACEMENT  01/2011   2.5x42mm Xience drug-eluting stent in ramus intermedius/OMI vessel, 2.5x32mm Promus Element stent in mid LAD artery   ESOPHAGOGASTRODUODENOSCOPY  10/10/2008   Dr. Maceo Pro hiatal hernia, normal esophagus, normal stomach   ESOPHAGOGASTRODUODENOSCOPY  02/12/2004   Dr. Geni Bers- normal    ESOPHAGOGASTRODUODENOSCOPY N/A 05/09/2015   Procedure: ESOPHAGOGASTRODUODENOSCOPY (EGD);  Surgeon:  Corbin Ade, MD;  [facility];  Service: Endoscopy;  Laterality: N/A;   ESOPHAGOGASTRODUODENOSCOPY (EGD) WITH PROPOFOL N/A 11/10/2017   Procedure: ESOPHAGOGASTRODUODENOSCOPY (EGD) WITH PROPOFOL;  Surgeon: Corbin Ade, MD;  [facility];  Service: Endoscopy;  Laterality: N/A;  9:45am   GIVENS CAPSULE STUDY N/A 06/03/2015   Procedure: GIVENS CAPSULE STUDY;  Surgeon: Corbin Ade, MD;  [facility]: [facility];  Service: Endoscopy;  Laterality: N/A;  0700   HOT HEMOSTASIS  11/10/2017   Procedure: HOT HEMOSTASIS (ARGON PLASMA COAGULATION/BICAP);  Surgeon: Corbin Ade, MD;  [facility];  Service: Endoscopy;;  rectum   KNEE SURGERY     left knee    LEFT HEART CATHETERIZATION WITH CORONARY ANGIOGRAM N/A 06/17/2014   Procedure: LEFT HEART CATHETERIZATION WITH CORONARY ANGIOGRAM;  Surgeon: Kathleene Hazel, MD;  Location: [facility] CATH LAB;  Service: Cardiovascular;  Laterality: N/A;   PARTIAL COLECTOMY  02/29/2012   Procedure: PARTIAL COLECTOMY;  Surgeon: Ernestene Mention, MD;  Location: [facility];  Service: General;  Laterality: Right;   POLYPECTOMY  11/10/2017   Procedure: POLYPECTOMY;  Surgeon: Corbin Ade, MD;  [facility];  Service: Endoscopy;;  gastric colon   PROSTATECTOMY     TONSILLECTOMY     TRANSURETHRAL RESECTION OF PROSTATE     VASECTOMY      Past Family History   Family History  Problem Relation Age of Onset   Throat cancer Father        dx in his 75s and again in his 37s; pipe and cigar smoker   Neuropathy Brother    Prostate cancer Brother 85   Melanoma Brother        dx in his 61s   Cancer Paternal Uncle        smoking related cancer   Heart attack Paternal Grandfather    Stomach cancer Cousin 70   Colon cancer Neg Hx     Past Social History   Social History   Socioeconomic History   Marital status: Married    Spouse name: Not on file   Number of children: 3   Years of education: military   Highest education level: Not on file  Occupational History   Occupation: Retired    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  Tobacco Use   Smoking status: Never   Smokeless tobacco: Never  Vaping Use   Vaping status: Never Used  Substance and Sexual Activity   Alcohol use: Yes    Comment: 1 drink every 6 months   Drug use: No   Sexual activity: Not Currently  Other Topics Concern   Not on file  Social History Narrative   Right handed   Caffeine~1 cup per day    Lives at home with wife    Social Determinants of Health   Financial Resource Strain: Low Risk  (12/15/2022)   Received from [facility], [facility]   Overall Financial Resource Strain (CARDIA)    Difficulty of Paying Living Expenses: Not hard at all  Food Insecurity: No Food Insecurity (12/15/2022)   Received from [facility], [facility]   Hunger Vital Sign    Worried About Running Out of Food in the Last Year: Never true    Ran Out of Food in the Last Year: Never true  Transportation Needs: No Transportation Needs (12/15/2022)   Received from [facility], [facility]   [facility] - Transportation    Lack  of Transportation (Medical): No    Lack of Transportation  (Non-Medical): No  Physical Activity: Inactive (07/14/2020)   Exercise Vital Sign    Days of Exercise per Week: 0 days    Minutes of Exercise per Session: 0 min  Stress: No Stress Concern Present (12/15/2022)   Received from [facility], [facility] - Occupational Stress Questionnaire    Feeling of Stress : Not at all  Social Connections: Unknown (01/11/2022)   Received from [facility]   Social Network    Social Network: Not on file  Intimate Partner Violence: Unknown (12/03/2021)   Received from [facility]   HITS    Physically Hurt: Not on file    Insult or Talk Down To: Not on file    Threaten Physical Harm: Not on file    Scream or Curse: Not on file    Review of Systems   General: Negative for anorexia, weight loss, fever, chills, fatigue, weakness. ENT: Negative for hoarseness, difficulty swallowing , nasal congestion. CV: Negative for chest pain, angina, palpitations, dyspnea on exertion, peripheral edema.  Respiratory: Negative for dyspnea at rest, dyspnea on exertion, cough, sputum, wheezing.  GI: See history of present illness. GU:  Negative for dysuria, hematuria, urinary incontinence, urinary frequency, nocturnal urination.  Endo: Negative for unusual weight change.     Physical Exam   There were no vitals taken for this visit.   General: Well-nourished, well-developed in no acute distress.  Eyes: No icterus. Mouth: Oropharyngeal mucosa moist and pink , no lesions erythema or exudate. Lungs: Clear to auscultation bilaterally.  Heart: Regular rate and rhythm, no murmurs rubs or gallops.  Abdomen: Bowel sounds are normal, nontender, nondistended, no hepatosplenomegaly or masses,  no abdominal bruits or hernia , no rebound or guarding.  Rectal: ***  Extremities: No lower extremity edema. No clubbing or deformities. Neuro: Alert and oriented x 4   Skin: Warm and dry, no jaundice.   Psych: Alert and cooperative,  normal mood and affect.  Labs   *** Imaging Studies   No results found.  Assessment       PLAN   ***   Leanna Battles. Melvyn Neth, MHS, PA-C [facility]

## 2023-04-25 ENCOUNTER — Ambulatory Visit: Payer: Medicare PPO | Admitting: Gastroenterology

## 2023-05-10 ENCOUNTER — Encounter: Payer: Self-pay | Admitting: Internal Medicine

## 2023-05-10 ENCOUNTER — Ambulatory Visit (INDEPENDENT_AMBULATORY_CARE_PROVIDER_SITE_OTHER): Payer: Medicare PPO | Admitting: Internal Medicine

## 2023-05-10 VITALS — BP 161/84 | HR 57 | Temp 97.6°F | Ht 70.5 in | Wt 197.4 lb

## 2023-05-10 DIAGNOSIS — K219 Gastro-esophageal reflux disease without esophagitis: Secondary | ICD-10-CM

## 2023-05-10 DIAGNOSIS — K625 Hemorrhage of anus and rectum: Secondary | ICD-10-CM

## 2023-05-10 NOTE — Patient Instructions (Signed)
It was good to see you again today!  As discussed, you should continue taking Protonix 40 mg daily at least 30 minutes before breakfast.  Use MiraLAX every night as needed to maintain good bowel function.  It is important not to dwell on the toilet.  Limit toilet time to 5 minutes.  If you have not achieved a bowel movement in 5 minutes get off the toilet and come back later.  Since your bleeding has resolved, as discussed, no further GI evaluation warranted at this time  Blood pressure running a little high today.  Continue to monitor.  Office visit in 6 months.

## 2023-05-10 NOTE — Progress Notes (Unsigned)
Primary Care Physician:  Catalina Lunger, DO Primary Gastroenterologist:  Dr. Jena Gauss  Pre-Procedure History & Physical: HPI:  David Pugh. is a 88 y.o. male here for follow-up of rectal bleeding/straining and GERD.  Patient well-known to me.  History of prostate cancer with radiation proctitis, colon cancer status post right hemicolectomy CLL remained stable.  Seen a few months ago by Tana Coast.  Started on MiraLAX.  Rectal bleeding and straining has resolved.   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  May spend over an hour on the toilet reading a magazine. He is very pleased.  Reflux well-controlled on Protonix 40 mg daily.  No dysphagia.  Blood pressure running a little high today. Even PCP is left town.  Patient wants to establish care with Dr. Durwin Nora.  Past Medical History:  Diagnosis Date   Abnormality of gait 12/18/2013   Arthritis    Basal cell carcinoma of scalp and skin of neck    Bilateral foot-drop 04/02/2019   Biliary dyskinesia    CAD (coronary artery disease) 2012   a. DES x2 in ramus/OM and mid LAD in 2012  b. balloon angioplaty of OM 2015   CLL (chronic lymphocytic leukemia) (HCC)    Colon cancer (HCC) 2013   Right hemicolectomy, did not tolerate chemotherapy   Colon polyps    tubular adenomas   Diverticula of colon 2010   Essential hypertension    Exposure to Agent Orange    Folic acid deficiency    Gastroparesis 2014   GERD (gastroesophageal reflux disease)    Glaucoma    Heart palpitations 2014   Hiatal hernia 2010   History of anemia as a child    History of kidney stones    Iron deficiency 2013   Myocardial infarction [facility]) 2012   Peripheral neuropathy    Prostate cancer (HCC) 2010   Radiation, surgery   PTSD (post-traumatic stress disorder)    Radiation proctitis    Rosacea     Past Surgical History:  Procedure Laterality Date   CARDIAC CATHETERIZATION  02/13/2011   [facility], [facility]  cardiology   CATARACT EXTRACTION     2011   CATARACT EXTRACTION W/PHACO Right 08/06/2013   Procedure: CATARACT EXTRACTION PHACO AND INTRAOCULAR LENS PLACEMENT (IOC);  Surgeon: Gemma Payor, MD;  Location: [facility];  Service: Ophthalmology;  Laterality: Right;  CDE:20.96   CHOLECYSTECTOMY     COLONOSCOPY  02/12/2004   Dr. Jena Gauss- L side diverticula, inflammatory  colon polyps   COLONOSCOPY  01/19/2012   RMR: Prominent changes involving the rectal mucosa consistent with radiation-induced proctitis. Multiple colonic  polyps removed as described above. Sigmoid Diverticulosis/ 1.5 x 2 cm relatively flat ulcerated lesion in cecum/path showed adenocarcinoma of the colon. descending colon polyp with tubular adenoma and one fragment of focal high grade dysplasia.    COLONOSCOPY  08/28/2012   [id number] proctitis. Colonic diverticulosis/Ulcerations at the surgical anastomosis  path: ulcerated colonic mucosa with prolapse changes, tubular adenoma.    COLONOSCOPY N/A 08/02/2013   RMR: Colonic polyps -removed as described above. Colonic diverticulosis. Status post reight hemicolectomy. Radiation proctitis- asymptomatic.    COLONOSCOPY N/A 05/09/2015   Procedure: COLONOSCOPY;  Surgeon: Corbin Ade, MD;  Location: [facility];  Service: Endoscopy;  Laterality: N/A;  1200-moved to [address]5 [address] to notify pt   COLONOSCOPY WITH PROPOFOL N/A 11/10/2017   Procedure: COLONOSCOPY WITH PROPOFOL;  Surgeon: Corbin Ade, MD;  Location: [facility]  END[facility];  Service: Endoscopy;  Laterality: N/A;   CORONARY ANGIOPLASTY  06/17/14   OM1 POBA   CORONARY STENT PLACEMENT  01/2011   2.5x32mm Xience drug-eluting stent in ramus intermedius/OMI vessel, 2.5x16mm Promus Element stent in mid LAD artery   ESOPHAGOGASTRODUODENOSCOPY  10/10/2008   Dr. Maceo Pro hiatal hernia, normal esophagus, normal stomach   ESOPHAGOGASTRODUODENOSCOPY  02/12/2004   Dr. Geni Bers- normal    ESOPHAGOGASTRODUODENOSCOPY N/A 05/09/2015   Procedure:  ESOPHAGOGASTRODUODENOSCOPY (EGD);  Surgeon: Corbin Ade, MD;  [facility];  Service: Endoscopy;  Laterality: N/A;   ESOPHAGOGASTRODUODENOSCOPY (EGD) WITH PROPOFOL N/A 11/10/2017   Procedure: ESOPHAGOGASTRODUODENOSCOPY (EGD) WITH PROPOFOL;  Surgeon: Corbin Ade, MD;  [facility]: [facility];  Service: Endoscopy;  Laterality: N/A;  9:45am   GIVENS CAPSULE STUDY N/A 06/03/2015   Procedure: GIVENS CAPSULE STUDY;  Surgeon: Corbin Ade, MD;  Location: [facility];  Service: Endoscopy;  Laterality: N/A;  0700   HOT HEMOSTASIS  11/10/2017   Procedure: HOT HEMOSTASIS (ARGON PLASMA COAGULATION/BICAP);  Surgeon: Corbin Ade, MD;  [facility];  Service: Endoscopy;;  rectum   KNEE SURGERY     left knee    LEFT HEART CATHETERIZATION WITH CORONARY ANGIOGRAM N/A 06/17/2014   Procedure: LEFT HEART CATHETERIZATION WITH CORONARY ANGIOGRAM;  Surgeon: Kathleene Hazel, MD;  Location: [facility] CATH LAB;  Service: Cardiovascular;  Laterality: N/A;   PARTIAL COLECTOMY  02/29/2012   Procedure: PARTIAL COLECTOMY;  Surgeon: Ernestene Mention, MD;  Location: [facility];  Service: General;  Laterality: Right;   POLYPECTOMY  11/10/2017   Procedure: POLYPECTOMY;  Surgeon: Corbin Ade, MD;  Location: [facility];  Service: Endoscopy;;  gastric colon   PROSTATECTOMY     TONSILLECTOMY     TRANSURETHRAL RESECTION OF PROSTATE     VASECTOMY      Prior to Admission medications   Medication Sig Start Date End Date Taking? Authorizing Provider  Apoaequorin (PREVAGEN EXTRA STRENGTH) 20 MG CAPS Take 1 capsule by mouth daily.   Yes [provider]  B Complex Vitamins (VITAMIN B COMPLEX PO) Take 1 tablet by mouth at bedtime.    Yes [provider]  cholecalciferol (VITAMIN D3) 25 MCG (1000 UNIT) tablet Take 1,000 Units by mouth 2 (two) times daily.   Yes [provider]  citalopram (CELEXA) 10 MG tablet Take 10 mg by mouth daily.   Yes [provider]   clopidogrel (PLAVIX) 75 MG tablet TAKE 1 TABLET BY MOUTH ONCE DAILY. 01/20/23  Yes Jonelle Sidle, MD  fluticasone [facility]) 50 MCG/ACT nasal spray Place 1 spray into both nostrils daily. 08/16/22  Yes [provider]  folic acid (FOLVITE) 1 MG tablet TAKE (1) TABLET BY MOUTH ONCE DAILY . Patient taking differently: Take 1 mg by mouth daily. 07/13/17  Yes Hubbard Hartshorn, NP  ibuprofen (ADVIL,MOTRIN) 200 MG tablet Take 400 mg by mouth daily as needed for headache or moderate pain.   Yes [provider]  latanoprost (XALATAN) 0.005 % ophthalmic solution 1 DROP IN [facility] EYE AT BEDTIME. 02/16/22  Yes Rankin, Alford Highland, MD  losartan (COZAAR) 50 MG tablet Take 25 mg by mouth 2 (two) times daily.    Yes [provider]  memantine (NAMENDA) 10 MG tablet Take 1 tablet (10 mg total) by mouth 2 (two) times daily. 11/25/22 11/20/23 Yes Camara, Amalia Hailey, MD  metoprolol (LOPRESSOR) 50 MG tablet Take 50 mg by mouth 2 (two) times daily.   Yes  [provider]  montelukast (SINGULAIR) 10 MG tablet Take 10 mg by mouth at bedtime.   Yes [provider]  Multiple Vitamin (MULTIVITAMIN WITH MINERALS) TABS tablet Take 1 tablet by mouth daily.   Yes [provider]  nitroGLYCERIN (NITROSTAT) 0.4 MG SL tablet Place 1 tablet (0.4 mg total) under the tongue every 5 (five) minutes x 3 doses as needed for chest pain (if no relief after 3rd dose, proceed to ED or call 911). 04/04/23  Yes Jonelle Sidle, MD  pantoprazole (PROTONIX) 40 MG tablet TAKE (1) TABLET TWICE DAILY. Patient taking differently: Take 40 mg by mouth daily. 01/04/21  Yes Gelene Mink, NP  polyethylene glycol (MIRALAX / GLYCOLAX) 17 g packet Take 17 g by mouth daily.   Yes [provider]  pravastatin (PRAVACHOL) 40 MG tablet TAKE 1 TABLET BY MOUTH AT BEDTIME. 11/30/22  Yes Jonelle Sidle, MD  rivastigmine (EXELON) 1.5 MG capsule Take 1 capsule (1.5 mg total) by mouth 2 (two) times daily. 11/25/22  11/20/23 Yes Windell Norfolk, MD  vitamin B-12 (CYANOCOBALAMIN) 1000 MCG tablet Take by mouth daily.    Yes [provider]  ALPRAZolam Prudy Feeler) 0.5 MG tablet Take 0.5 mg by mouth daily as needed for anxiety. Patient not taking: Reported on 05/10/2023    [provider]  loperamide (IMODIUM) 2 MG capsule Take 1 capsule (2 mg total) by mouth as needed for diarrhea or loose stools (maximum 16 mg per day). Patient not taking: Reported on 05/10/2023 11/13/20   Linwood Dibbles, MD    Allergies as of 05/10/2023 - Review Complete 05/10/2023  Allergen Reaction Noted   Shellfish allergy Anaphylaxis 01/13/2012   Sulfonamide derivatives Diarrhea and Nausea Only 04/30/2009    Family History  Problem Relation Age of Onset   Throat cancer Father        dx in his 62s and again in his 12s; pipe and cigar smoker   Neuropathy Brother    Prostate cancer Brother 7   Melanoma Brother        dx in his 60s   Cancer Paternal Uncle        smoking related cancer   Heart attack Paternal Grandfather    Stomach cancer Cousin 71   Colon cancer Neg Hx     Social History   Socioeconomic History   Marital status: Married    Spouse name: Not on file   Number of children: 3   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   Highest education level: Not on file  Occupational History   Occupation: Retired    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  Tobacco Use   Smoking status: Never   Smokeless tobacco: Never  Vaping Use   Vaping status: Never Used  Substance and Sexual Activity   Alcohol use: Yes    Comment: 1 drink every 6 months   Drug use: No   Sexual activity: Not Currently  Other Topics Concern   Not on file  Social History Narrative   Right handed   Caffeine~1 cup per day    Lives at home with wife    Social Determinants of Health   Financial Resource Strain: Low Risk  (12/15/2022)   Received from [facility], [facility]   Overall Financial Resource Strain (CARDIA)    Difficulty of Paying Living Expenses:  Not hard at all  Food Insecurity: No Food Insecurity (12/15/2022)   Received from [facility], [facility]   Hunger Vital Sign  Worried About Programme researcher, broadcasting/film/video in the Last Year: Never true    Ran Out of Food in the Last Year: Never true  Transportation Needs: No Transportation Needs (12/15/2022)   Received from [facility], [facility]   [facility] - Transportation    Lack of Transportation (Medical): No    Lack of Transportation (Non-Medical): No  Physical Activity: Inactive (07/14/2020)   Exercise Vital Sign    Days of Exercise per Week: 0 days    Minutes of Exercise per Session: 0 min  Stress: No Stress Concern Present (12/15/2022)   Received from [facility], [facility] of Occupational Health - Occupational Stress Questionnaire    Feeling of Stress : Not at all  Social Connections: Unknown (01/11/2022)   Received from [facility]   Social Network    Social Network: Not on file  Intimate Partner Violence: Unknown (12/03/2021)   Received from [facility]   HITS    Physically Hurt: Not on file    Insult or Talk Down To: Not on file    Threaten Physical Harm: Not on file    Scream or Curse: Not on file    Review of Systems: See HPI, otherwise negative ROS  Physical Exam: BP (!) 161/84 (BP Location: Right Arm, Patient Position: Sitting, Cuff Size: Normal)   Pulse (!) 57   Temp 97.6 F (36.4 C) (Oral)   Ht 5' 10.5" (1.791 m)   Wt 197 lb 6.4 oz (89.5 kg)   SpO2 98%   BMI 27.92 kg/m  General:   Alert,  Well-developed, well-nourished, pleasant and cooperative in NAD Neck:  Supple; no masses or thyromegaly. No significant cervical adenopathy. Lungs:  Clear throughout to auscultation.   No wheezes, crackles, or rhonchi. No acute distress. Heart:  Regular rate and rhythm; no murmurs, clicks, rubs,  or gallops. Abdomen: Non-distended, normal bowel sounds.  Soft and nontender without appreciable mass or hepatosplenomegaly.    Impression/Plan: Very pleasant 88 year old [place] [employer] veteran with recent constipation/straining paper hematochezia.  Symptoms solved with better bowel function via MiraLAX.  Spends too much time on the commode as alluded to above.  GERD is well-controlled on Protonix 40 mg daily.  Recommendations:  As discussed, continue taking Protonix 40 mg daily at least 30 minutes before breakfast.  Use MiraLAX every night as needed to maintain good bowel function.  It is important not to dwell on the toilet.  Limit toilet time to 5 minutes.  If  Bowel movement not achieved in 5 minutes get off the toilet and come back later.  Since bleeding has resolved, as discussed, no further GI evaluation warranted at this time  Blood pressure running a little high today.  Continue to monitor.  Office visit in 6 months.   Notice: This dictation was prepared with Dragon dictation along with smaller phrase technology. Any transcriptional errors that result from this process are unintentional and may not be corrected upon review.

## 2023-07-01 ENCOUNTER — Other Ambulatory Visit: Payer: Self-pay | Admitting: Cardiology

## 2023-07-15 ENCOUNTER — Ambulatory Visit: Payer: Medicare PPO | Admitting: Internal Medicine

## 2023-07-15 ENCOUNTER — Encounter: Payer: Self-pay | Admitting: Internal Medicine

## 2023-07-15 VITALS — BP 156/76 | HR 58 | Resp 16 | Ht 70.5 in | Wt 199.6 lb

## 2023-07-15 DIAGNOSIS — E559 Vitamin D deficiency, unspecified: Secondary | ICD-10-CM

## 2023-07-15 DIAGNOSIS — I10 Essential (primary) hypertension: Secondary | ICD-10-CM

## 2023-07-15 DIAGNOSIS — E785 Hyperlipidemia, unspecified: Secondary | ICD-10-CM | POA: Diagnosis not present

## 2023-07-15 DIAGNOSIS — K219 Gastro-esophageal reflux disease without esophagitis: Secondary | ICD-10-CM | POA: Diagnosis not present

## 2023-07-15 DIAGNOSIS — C9110 Chronic lymphocytic leukemia of B-cell type not having achieved remission: Secondary | ICD-10-CM

## 2023-07-15 DIAGNOSIS — Z85038 Personal history of other malignant neoplasm of large intestine: Secondary | ICD-10-CM

## 2023-07-15 DIAGNOSIS — R413 Other amnesia: Secondary | ICD-10-CM

## 2023-07-15 DIAGNOSIS — C44222 Squamous cell carcinoma of skin of right ear and external auricular canal: Secondary | ICD-10-CM

## 2023-07-15 DIAGNOSIS — Z1329 Encounter for screening for other suspected endocrine disorder: Secondary | ICD-10-CM

## 2023-07-15 DIAGNOSIS — I2510 Atherosclerotic heart disease of native coronary artery without angina pectoris: Secondary | ICD-10-CM | POA: Diagnosis not present

## 2023-07-15 DIAGNOSIS — Z8546 Personal history of malignant neoplasm of prostate: Secondary | ICD-10-CM

## 2023-07-15 DIAGNOSIS — F4310 Post-traumatic stress disorder, unspecified: Secondary | ICD-10-CM

## 2023-07-15 DIAGNOSIS — G8929 Other chronic pain: Secondary | ICD-10-CM

## 2023-07-15 DIAGNOSIS — M25561 Pain in right knee: Secondary | ICD-10-CM

## 2023-07-15 DIAGNOSIS — J302 Other seasonal allergic rhinitis: Secondary | ICD-10-CM

## 2023-07-15 MED ORDER — AZELASTINE-FLUTICASONE 137-50 MCG/ACT NA SUSP: 1.0000 | Freq: Two times a day (BID) | NASAL | 1 refills | Status: DC

## 2023-07-15 NOTE — Assessment & Plan Note (Signed)
History of ascending colon cancer s/p right hemicolectomy (2013).  Followed by oncology.  Completed surveillance x 5 years.  Underwent repeat colonoscopy in 2019.

## 2023-07-15 NOTE — Assessment & Plan Note (Signed)
Adequately controlled on current antihypertensive regimen consisting of losartan and metoprolol.

## 2023-07-15 NOTE — Assessment & Plan Note (Signed)
Followed by [facility] neurology.  He is prescribed Namenda and Exelon.

## 2023-07-15 NOTE — Assessment & Plan Note (Signed)
Symptoms are well troll with Celexa 10 mg daily.  No medication changes are indicated today.

## 2023-07-15 NOTE — Assessment & Plan Note (Signed)
Followed by dermatology (Dr. Margo Aye).  Reports that he will undergo Mohs surgery next week.

## 2023-07-15 NOTE — Assessment & Plan Note (Signed)
History of prostate cancer treated with definitive radiation (2011).  TURP x 2.  Followed by oncology.

## 2023-07-15 NOTE — Assessment & Plan Note (Signed)
Stage 0 CLL.  Diagnosed in 2016.  Followed by oncology (Dr. Ellin Saba).  Not on any active treatment.  Oncology follow-up is scheduled for January.

## 2023-07-15 NOTE — Patient Instructions (Signed)
It was a pleasure to see you today.  Thank you for giving [place] the opportunity to be involved in your care.  Below is a brief recap of your visit and next steps.  We will plan to see you again in 6 months.  Summary You have established care today We will update labs Try increase protonix to twice daily  Switch to azelastine-fluticasone nasal spray for improved symptom relief Orthopedic surgery referral placed Follow up in 6 months

## 2023-07-15 NOTE — Assessment & Plan Note (Signed)
He is currently prescribed pravastatin 40 mg daily.  Lipid panel at goal when recently updated.

## 2023-07-15 NOTE — Progress Notes (Signed)
New Patient Office Visit  Subjective    Patient ID: David Pugh., male    DOB: February 12, 1935  Age: 88 y.o. MRN: [id number]  CC:  Chief Complaint  Patient presents with   Establish Care    Sinus drainage at night anytime he lays flat    Knee Pain    Has been having right pain but no swelling x 6 months   Hip Pain    Right hip pain, does not radiate    HPI David Pugh. presents to establish care.  He is an 88 year old male with a past medical history significant for CAD, HTN, HLD, CLL, prostate cancer, colon cancer, SCC (right ear), MCI, HTN, GERD, and chronic right knee pain.  He was previously followed by Dr. Margo Common.  Mr. Schnittker endorses chronic right knee pain for the last 6 months.  He is interested in establishing care with orthopedic surgery to discuss surgical intervention.  He additionally endorses persistent chest congestion that is most notable when laying down.  He states that he wakes up at night coughing up clear secretions.  Acute concerns, chronic medical conditions, and outstanding preventative care items discussed today are individually addressed A/P below.   Outpatient Encounter Medications as of 07/15/2023  Medication Sig   Azelastine-Fluticasone 137-50 MCG/ACT SUSP Place 1 spray into the nose every 12 (twelve) hours.   B Complex Vitamins (VITAMIN B COMPLEX PO) Take 1 tablet by mouth at bedtime.    cholecalciferol (VITAMIN D3) 25 MCG (1000 UNIT) tablet Take 1,000 Units by mouth 2 (two) times daily.   citalopram (CELEXA) 10 MG tablet Take 10 mg by mouth daily.   clopidogrel (PLAVIX) 75 MG tablet TAKE 1 TABLET BY MOUTH ONCE DAILY.   folic acid (FOLVITE) 1 MG tablet TAKE (1) TABLET BY MOUTH ONCE DAILY . (Patient taking differently: Take 1 mg by mouth daily.)   latanoprost (XALATAN) 0.005 % ophthalmic solution 1 DROP IN [facility] EYE AT BEDTIME.   losartan (COZAAR) 50 MG tablet Take 25 mg by mouth 2 (two) times daily.    memantine (NAMENDA) 10 MG tablet Take  1 tablet (10 mg total) by mouth 2 (two) times daily.   metoprolol (LOPRESSOR) 50 MG tablet Take 50 mg by mouth 2 (two) times daily.   montelukast (SINGULAIR) 10 MG tablet Take 10 mg by mouth at bedtime.   Multiple Vitamin (MULTIVITAMIN WITH MINERALS) TABS tablet Take 1 tablet by mouth daily.   pantoprazole (PROTONIX) 40 MG tablet TAKE (1) TABLET TWICE DAILY. (Patient taking differently: Take 40 mg by mouth daily.)   polyethylene glycol (MIRALAX / GLYCOLAX) 17 g packet Take 17 g by mouth daily.   pravastatin (PRAVACHOL) 40 MG tablet take 1 tablet by mouth at bedtime.   rivastigmine (EXELON) 1.5 MG capsule Take 1 capsule (1.5 mg total) by mouth 2 (two) times daily.   vitamin B-12 (CYANOCOBALAMIN) 1000 MCG tablet Take by mouth daily.    [DISCONTINUED] ALPRAZolam (XANAX) 0.5 MG tablet Take 0.5 mg by mouth daily as needed for anxiety.   [DISCONTINUED] fluticasone (FLONASE) 50 MCG/ACT nasal spray Place 1 spray into both nostrils daily.   Apoaequorin (PREVAGEN EXTRA STRENGTH) 20 MG CAPS Take 1 capsule by mouth daily.   ibuprofen (ADVIL,MOTRIN) 200 MG tablet Take 400 mg by mouth daily as needed for headache or moderate pain.   loperamide (IMODIUM) 2 MG capsule Take 1 capsule (2 mg total) by mouth as needed for diarrhea or loose stools (maximum 16 mg per day). (Patient  not taking: Reported on 07/15/2023)   nitroGLYCERIN (NITROSTAT) 0.4 MG SL tablet Place 1 tablet (0.4 mg total) under the tongue every 5 (five) minutes x 3 doses as needed for chest pain (if no relief after 3rd dose, proceed to ED or call 911). (Patient not taking: Reported on 07/15/2023)   No facility-administered encounter medications on file as of 07/15/2023.    Past Medical History:  Diagnosis Date   Abnormality of gait 12/18/2013   Arthritis    Basal cell carcinoma of scalp and skin of neck    Bilateral foot-drop 04/02/2019   Biliary dyskinesia    CAD (coronary artery disease) 2012   a. DES x2 in ramus/OM and mid LAD in 2012  b.  balloon angioplaty of OM 2015   CLL (chronic lymphocytic leukemia) (HCC)    Colon cancer (HCC) 2013   Right hemicolectomy, did not tolerate chemotherapy   Colon polyps    tubular adenomas   Diverticula of colon 2010   Essential hypertension    Exposure to Agent Orange    Folic acid deficiency    Gastroparesis 2014   GERD (gastroesophageal reflux disease)    Glaucoma    Heart palpitations 2014   Hiatal hernia 2010   History of anemia as a child    History of kidney stones    Iron deficiency 2013   Myocardial infarction [facility]) 2012   Peripheral neuropathy    Prostate cancer (HCC) 2010   Radiation, surgery   PTSD (post-traumatic stress disorder)    Radiation proctitis    Rosacea     Past Surgical History:  Procedure Laterality Date   CARDIAC CATHETERIZATION  02/13/2011   [facility], [facility] cardiology   CATARACT EXTRACTION     2011   CATARACT EXTRACTION W/PHACO Right 08/06/2013   Procedure: CATARACT EXTRACTION PHACO AND INTRAOCULAR LENS PLACEMENT (IOC);  Surgeon: Gemma Payor, MD;  Location: [facility];  Service: Ophthalmology;  Laterality: Right;  CDE:20.96   CHOLECYSTECTOMY     COLONOSCOPY  02/12/2004   Dr. Jena Gauss- L side diverticula, inflammatory  colon polyps   COLONOSCOPY  01/19/2012   RMR: Prominent changes involving the rectal mucosa consistent with radiation-induced proctitis. Multiple colonic  polyps removed as described above. Sigmoid Diverticulosis/ 1.5 x 2 cm relatively flat ulcerated lesion in cecum/path showed adenocarcinoma of the colon. descending colon polyp with tubular adenoma and one fragment of focal high grade dysplasia.    COLONOSCOPY  08/28/2012   [id number] proctitis. Colonic diverticulosis/Ulcerations at the surgical anastomosis  path: ulcerated colonic mucosa with prolapse changes, tubular adenoma.    COLONOSCOPY N/A 08/02/2013   RMR: Colonic polyps -removed as described above. Colonic diverticulosis. Status post reight hemicolectomy. Radiation proctitis-  asymptomatic.    COLONOSCOPY N/A 05/09/2015   Procedure: COLONOSCOPY;  Surgeon: Corbin Ade, MD;  Location: [facility];  Service: Endoscopy;  Laterality: N/A;  1200-moved to [address] to notify pt   COLONOSCOPY WITH PROPOFOL N/A 11/10/2017   Procedure: COLONOSCOPY WITH PROPOFOL;  Surgeon: Corbin Ade, MD;  Location: [facility];  Service: Endoscopy;  Laterality: N/A;   CORONARY ANGIOPLASTY  06/17/14   OM1 POBA   CORONARY STENT PLACEMENT  01/2011   2.5x47mm Xience drug-eluting stent in ramus intermedius/OMI vessel, 2.5x3mm Promus Element stent in mid LAD artery   ESOPHAGOGASTRODUODENOSCOPY  10/10/2008   Dr. Maceo Pro hiatal hernia, normal esophagus, normal stomach   ESOPHAGOGASTRODUODENOSCOPY  02/12/2004   Dr. Geni Bers- normal    ESOPHAGOGASTRODUODENOSCOPY N/A 05/09/2015   Procedure: ESOPHAGOGASTRODUODENOSCOPY (EGD);  Surgeon: Corbin Ade, MD;  [facility];  Service: Endoscopy;  Laterality: N/A;   ESOPHAGOGASTRODUODENOSCOPY (EGD) WITH PROPOFOL N/A 11/10/2017   Procedure: ESOPHAGOGASTRODUODENOSCOPY (EGD) WITH PROPOFOL;  Surgeon: Corbin Ade, MD;  Location: [facility];  Service: Endoscopy;  Laterality: N/A;  9:45am   GIVENS CAPSULE STUDY N/A 06/03/2015   Procedure: GIVENS CAPSULE STUDY;  Surgeon: Corbin Ade, MD;  Location: [facility];  Service: Endoscopy;  Laterality: N/A;  0700   HOT HEMOSTASIS  11/10/2017   Procedure: HOT HEMOSTASIS (ARGON PLASMA COAGULATION/BICAP);  Surgeon: Corbin Ade, MD;  Location[facility];  Service: Endoscopy;;  rectum   KNEE SURGERY     left knee    LEFT HEART CATHETERIZATION WITH CORONARY ANGIOGRAM N/A 06/17/2014   Procedure: LEFT HEART CATHETERIZATION WITH CORONARY ANGIOGRAM;  Surgeon: Kathleene Hazel, MD;  Location: [facility] CATH LAB;  Service: Cardiovascular;  Laterality: N/A;   PARTIAL COLECTOMY  02/29/2012   Procedure: PARTIAL COLECTOMY;  Surgeon: Ernestene Mention, MD;  Location: [facility];  Service: General;  Laterality:  Right;   POLYPECTOMY  11/10/2017   Procedure: POLYPECTOMY;  Surgeon: Corbin Ade, MD;  Location: [facility];  Service: Endoscopy;;  gastric colon   PROSTATECTOMY     TONSILLECTOMY     TRANSURETHRAL RESECTION OF PROSTATE     VASECTOMY      Family History  Problem Relation Age of Onset   Throat cancer Father        dx in his 15s and again in his 41s; pipe and cigar smoker   Neuropathy Brother    Prostate cancer Brother 5   Melanoma Brother        dx in his 55s   Cancer Paternal Uncle        smoking related cancer   Heart attack Paternal Grandfather    Stomach cancer Cousin 37   Colon cancer Neg Hx     Social History   Socioeconomic History   Marital status: Married    Spouse name: Not on file   Number of children: 3   Years of education: military   Highest education level: Not on file  Occupational History   Occupation: Retired    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  Tobacco Use   Smoking status: Never   Smokeless tobacco: Never  Vaping Use   Vaping status: Never Used  Substance and Sexual Activity   Alcohol use: Yes    Comment: 1 drink every 6 months   Drug use: No   Sexual activity: Not Currently  Other Topics Concern   Not on file  Social History Narrative   Right handed   Caffeine~1 cup per day    Lives at home with wife    Social Determinants of Health   Financial Resource Strain: Low Risk  (12/15/2022)   Received from [facility], [facility]   Overall Financial Resource Strain (CARDIA)    Difficulty of Paying Living Expenses: Not hard at all  Food Insecurity: No Food Insecurity (12/15/2022)   Received from [facility], [facility]   Hunger Vital Sign    Worried About Running Out of Food in the Last Year: Never true    Ran Out of Food in the Last Year: Never true  Transportation Needs: No Transportation Needs (12/15/2022)   Received from [facility], [facility]   [facility] - Transportation    Lack of Transportation (Medical): No    Lack  of Transportation (Non-Medical): No  Physical Activity: Inactive (07/14/2020)   Exercise Vital Sign    Days of Exercise per Week: 0 days    Minutes of Exercise per Session: 0 min  Stress: No Stress Concern Present (12/15/2022)   Received from [facility], [facility] of Occupational Health - Occupational Stress Questionnaire    Feeling of Stress : Not at all  Social Connections: Unknown (01/11/2022)   Received from [facility], [facility]   Social Network    Social Network: Not on file  Intimate Partner Violence: Unknown (12/03/2021)   Received from [facility], [facility]   HITS    Physically Hurt: Not on file    Insult or Talk Down To: Not on file    Threaten Physical Harm: Not on file    Scream or Curse: Not on file   Review of Systems  Constitutional:  Negative for chills and fever.  HENT:  Positive for congestion. Negative for sore throat.   Respiratory:  Positive for cough and sputum production. Negative for shortness of breath.   Cardiovascular:  Negative for chest pain, palpitations and leg swelling.  Gastrointestinal:  Negative for abdominal pain, blood in stool, constipation, diarrhea, nausea and vomiting.  Genitourinary:  Negative for dysuria and hematuria.  Musculoskeletal:  Positive for joint pain (Chronic right knee pain). Negative for myalgias.  Skin:  Negative for itching and rash.  Neurological:  Negative for dizziness and headaches.  Psychiatric/Behavioral:  Negative for depression and suicidal ideas.    Objective    BP (!) 156/76   Pulse (!) 58   Resp 16   Ht 5' 10.5" (1.791 m)   Wt 199 lb 9.6 oz (90.5 kg)   SpO2 98%   BMI 28.23 kg/m   Physical Exam Vitals reviewed.  Constitutional:      General: He is not in acute distress.    Appearance: Normal appearance. He is not ill-appearing.  HENT:     Head: Normocephalic and atraumatic.     Right Ear: External ear normal.     Left Ear: External ear normal.     Nose:  Nose normal. No congestion or rhinorrhea.     Mouth/Throat:     Mouth: Mucous membranes are moist.     Pharynx: Oropharynx is clear.  Eyes:     General: No scleral icterus.    Extraocular Movements: Extraocular movements intact.     Conjunctiva/sclera: Conjunctivae normal.     Pupils: Pupils are equal, round, and reactive to light.  Cardiovascular:     Rate and Rhythm: Normal rate and regular rhythm.     Pulses: Normal pulses.     Heart sounds: Normal heart sounds. No murmur heard. Pulmonary:     Effort: Pulmonary effort is normal.     Breath sounds: Normal breath sounds. No wheezing, rhonchi or rales.  Abdominal:     General: Abdomen is flat. Bowel sounds are normal. There is no distension.     Palpations: Abdomen is soft.     Tenderness: There is no abdominal tenderness.  Musculoskeletal:        General: No swelling or deformity. Normal range of motion.     Cervical back: Normal range of motion.  Skin:    General: Skin is warm and dry.     Capillary Refill: Capillary refill takes less than 2 seconds.  Neurological:     General: No focal deficit present.     Mental Status: He  is alert and oriented to person, place, and time.     Motor: No weakness.     Gait: Gait abnormal (Ambulates with cane).  Psychiatric:        Mood and Affect: Mood normal.        Behavior: Behavior normal.        Thought Content: Thought content normal.   Last CBC Lab Results  Component Value Date   WBC 17.3 (H) 03/10/2023   HGB 14.2 03/10/2023   HCT 43.3 03/10/2023   MCV 95.8 03/10/2023   MCH 31.4 03/10/2023   RDW 12.8 03/10/2023   PLT 350 03/10/2023   Last metabolic panel Lab Results  Component Value Date   GLUCOSE 110 (H) 03/10/2023   NA 140 03/10/2023   K 4.3 03/10/2023   CL 109 03/10/2023   CO2 24 03/10/2023   BUN 16 03/10/2023   CREATININE 1.22 03/10/2023   GFRNONAA 58 (L) 03/10/2023   CALCIUM 8.9 03/10/2023   PROT 6.5 03/10/2023   ALBUMIN 3.5 03/10/2023   LABGLOB 2.5  04/02/2019   BILITOT 1.0 03/10/2023   ALKPHOS 62 03/10/2023   AST 23 03/10/2023   ALT 20 03/10/2023   ANIONGAP 7 03/10/2023   Last lipids Lab Results  Component Value Date   CHOL 133 04/27/2017   HDL 63 04/27/2017   LDLCALC 49 04/27/2017   TRIG 106 04/27/2017   CHOLHDL 2.1 04/27/2017   Last hemoglobin A1c Lab Results  Component Value Date   HGBA1C 5.9 (H) 11/25/2013   Last thyroid functions Lab Results  Component Value Date   TSH 3.129 10/14/2012   Last vitamin D Lab Results  Component Value Date   VD25OH 57.37 08/04/2021   Last vitamin B12 and Folate Lab Results  Component Value Date   VITAMINB12 583 08/04/2021   FOLATE 58.6 08/04/2021   Assessment & Plan:   Problem List Items Addressed This Visit       CAD (coronary artery disease) - Primary (Chronic)    History of CAD with inferior MI in 2012.  Underwent PCI with DES x 2 to RI/OM and LAD.  Then underwent balloon pulm angioplasty of OM in 2015.  Followed by cardiology (Dr. Diona Browner).  Denies recent chest pain.  He is currently prescribed Plavix, pravastatin, and metoprolol.      HTN (hypertension) (Chronic)    Adequately controlled on current antihypertensive regimen consisting of losartan and metoprolol.      Seasonal allergic rhinitis    His acute concern today is congestion and sputum production that occurs at night.  He is asymptomatic during the day.  His pulmonary exam is unremarkable.  He states that he wakes up at night spitting up copious amounts of clear sputum.  He is currently using fluticasone nasal spray. Will try azelastine-fluticasone nasal spray and have also recommended use of a daily antihistamine.  He will return to care if symptoms worsen or fail to improve.      GERD    Followed by gastroenterology (Dr. Jena Gauss).  Currently taking Protonix 40 mg daily.  I recommended increasing the frequency of Protonix to twice daily in case symptoms he is experiencing at night are attributable to GERD.       Squamous cell carcinoma of right ear    Followed by dermatology (Dr. Margo Aye).  Reports that he will undergo Mohs surgery next week.      Dyslipidemia (Chronic)    He is currently prescribed pravastatin 40 mg daily.  Lipid panel at goal when  recently updated.      H/O prostate cancer    History of prostate cancer treated with definitive radiation (2011).  TURP x 2.  Followed by oncology.      History of colon cancer    History of ascending colon cancer s/p right hemicolectomy (2013).  Followed by oncology.  Completed surveillance x 5 years.  Underwent repeat colonoscopy in 2019.      CLL (chronic lymphocytic leukemia) (HCC)    Stage 0 CLL.  Diagnosed in 2016.  Followed by oncology (Dr. Ellin Saba).  Not on any active treatment.  Oncology follow-up is scheduled for January.      PTSD (post-traumatic stress disorder)    Symptoms are well troll with Celexa 10 mg daily.  No medication changes are indicated today.      Memory impairment    Followed by [facility] neurology.  He is prescribed Namenda and Exelon.      Chronic pain of right knee    His acute concern today is chronic right knee pain.  He displays an antalgic gait and would like to discuss right knee arthroplasty.  Orthopedic surgery referral placed today at his request.      Return in about 6 months (around 01/12/2024).   Billie Lade, MD

## 2023-07-15 NOTE — Assessment & Plan Note (Signed)
His acute concern today is congestion and sputum production that occurs at night.  He is asymptomatic during the day.  His pulmonary exam is unremarkable.  He states that he wakes up at night spitting up copious amounts of clear sputum.  He is currently using fluticasone nasal spray. Will try azelastine-fluticasone nasal spray and have also recommended use of a daily antihistamine.  He will return to care if symptoms worsen or fail to improve.

## 2023-07-15 NOTE — Assessment & Plan Note (Signed)
Followed by gastroenterology (Dr. Jena Gauss).  Currently taking Protonix 40 mg daily.  I recommended increasing the frequency of Protonix to twice daily in case symptoms he is experiencing at night are attributable to GERD.

## 2023-07-15 NOTE — Assessment & Plan Note (Addendum)
History of CAD with inferior MI in 2012.  Underwent PCI with DES x 2 to RI/OM and LAD.  Then underwent balloon pulm angioplasty of OM in 2015.  Followed by cardiology (Dr. Diona Browner).  Denies recent chest pain.  He is currently prescribed Plavix, pravastatin, and metoprolol.

## 2023-07-15 NOTE — Assessment & Plan Note (Signed)
His acute concern today is chronic right knee pain.  He displays an antalgic gait and would like to discuss right knee arthroplasty.  Orthopedic surgery referral placed today at his request.

## 2023-07-16 LAB — CBC WITH DIFFERENTIAL/PLATELET
Basophils Absolute: 0.1 10*3/uL (ref 0.0–0.2)
Basos: 0 %
EOS (ABSOLUTE): 0.4 10*3/uL (ref 0.0–0.4)
Eos: 2 %
Hematocrit: 44.6 % (ref 37.5–51.0)
Hemoglobin: 14.7 g/dL (ref 13.0–17.7)
Immature Grans (Abs): 0 10*3/uL (ref 0.0–0.1)
Immature Granulocytes: 0 %
Lymphocytes Absolute: 8.5 10*3/uL — ABNORMAL HIGH (ref 0.7–3.1)
Lymphs: 47 %
MCH: 31.7 pg (ref 26.6–33.0)
MCHC: 33 g/dL (ref 31.5–35.7)
MCV: 96 fL (ref 79–97)
Monocytes Absolute: 1.2 10*3/uL — ABNORMAL HIGH (ref 0.1–0.9)
Monocytes: 6 %
Neutrophils Absolute: 8.3 10*3/uL — ABNORMAL HIGH (ref 1.4–7.0)
Neutrophils: 45 %
Platelets: 362 10*3/uL (ref 150–450)
RBC: 4.63 x10E6/uL (ref 4.14–5.80)
RDW: 11.8 % (ref 11.6–15.4)
WBC: 18.5 10*3/uL — ABNORMAL HIGH (ref 3.4–10.8)

## 2023-07-16 LAB — CMP14+EGFR
ALT: 28 [IU]/L (ref 0–44)
AST: 26 [IU]/L (ref 0–40)
Albumin: 4 g/dL (ref 3.7–4.7)
Alkaline Phosphatase: 89 [IU]/L (ref 44–121)
BUN/Creatinine Ratio: 11 (ref 10–24)
BUN: 14 mg/dL (ref 8–27)
Bilirubin Total: 0.9 mg/dL (ref 0.0–1.2)
CO2: 23 mmol/L (ref 20–29)
Calcium: 9.7 mg/dL (ref 8.6–10.2)
Chloride: 108 mmol/L — ABNORMAL HIGH (ref 96–106)
Creatinine, Ser: 1.23 mg/dL (ref 0.76–1.27)
Globulin, Total: 2.3 g/dL (ref 1.5–4.5)
Glucose: 90 mg/dL (ref 70–99)
Potassium: 4.6 mmol/L (ref 3.5–5.2)
Sodium: 146 mmol/L — ABNORMAL HIGH (ref 134–144)
Total Protein: 6.3 g/dL (ref 6.0–8.5)
eGFR: 57 mL/min/{1.73_m2} — ABNORMAL LOW (ref >59)

## 2023-07-16 LAB — VITAMIN D 25 HYDROXY (VIT D DEFICIENCY, FRACTURES): Vit D, 25-Hydroxy: 54.8 ng/mL (ref 30.0–100.0)

## 2023-07-16 LAB — B12 AND FOLATE PANEL
Folate: >20 ng/mL (ref >3.0)
Vitamin B-12: 1536 pg/mL — ABNORMAL HIGH (ref 232–1245)

## 2023-07-16 LAB — TSH+FREE T4
Free T4: 1.05 ng/dL (ref 0.82–1.77)
TSH: 2.36 u[IU]/mL (ref 0.450–4.500)

## 2023-07-18 ENCOUNTER — Other Ambulatory Visit: Payer: Self-pay | Admitting: Internal Medicine

## 2023-07-18 DIAGNOSIS — C9110 Chronic lymphocytic leukemia of B-cell type not having achieved remission: Secondary | ICD-10-CM

## 2023-08-02 ENCOUNTER — Encounter: Payer: Self-pay | Admitting: Orthopedic Surgery

## 2023-08-02 ENCOUNTER — Other Ambulatory Visit: Payer: Self-pay | Admitting: Orthopedic Surgery

## 2023-08-02 ENCOUNTER — Ambulatory Visit (INDEPENDENT_AMBULATORY_CARE_PROVIDER_SITE_OTHER): Payer: Medicare PPO | Admitting: Orthopedic Surgery

## 2023-08-02 ENCOUNTER — Other Ambulatory Visit (INDEPENDENT_AMBULATORY_CARE_PROVIDER_SITE_OTHER): Payer: Self-pay

## 2023-08-02 VITALS — BP 153/75 | HR 63 | Ht 70.5 in | Wt 194.0 lb

## 2023-08-02 DIAGNOSIS — M25561 Pain in right knee: Secondary | ICD-10-CM

## 2023-08-02 DIAGNOSIS — M1711 Unilateral primary osteoarthritis, right knee: Secondary | ICD-10-CM

## 2023-08-02 DIAGNOSIS — G8929 Other chronic pain: Secondary | ICD-10-CM

## 2023-08-02 DIAGNOSIS — R269 Unspecified abnormalities of gait and mobility: Secondary | ICD-10-CM | POA: Diagnosis not present

## 2023-08-02 NOTE — Progress Notes (Deleted)
New Patient Visit  Assessment: David Pugh. is a 88 y.o. male with the following: 1. Chronic pain of right knee ***   Plan: [name].   Follow-up: No follow-ups on file.  Subjective:  Chief Complaint  Patient presents with   Knee Pain    R knee pain for 1 yr. Pt states he does have catching of the knee and c/o gait concerns.     History of Present Illness: Henock Banaga. is a 88 y.o. male who {Presentation:27320} for evaluation of    Review of Systems: No fevers or chills*** No numbness or tingling No chest pain No shortness of breath No bowel or bladder dysfunction No GI distress No headaches   Medical History:  Past Medical History:  Diagnosis Date   Abnormality of gait 12/18/2013   Arthritis    Basal cell carcinoma of scalp and skin of neck    Bilateral foot-drop 04/02/2019   Biliary dyskinesia    CAD (coronary artery disease) 2012   a. DES x2 in ramus/OM and mid LAD in 2012  b. balloon angioplaty of OM 2015   CLL (chronic lymphocytic leukemia) (HCC)    Colon cancer (HCC) 2013   Right hemicolectomy, did not tolerate chemotherapy   Colon polyps    tubular adenomas   Diverticula of colon 2010   Essential hypertension    Exposure to Agent Orange    Folic acid deficiency    Gastroparesis 2014   GERD (gastroesophageal reflux disease)    Glaucoma    Heart palpitations 2014   Hiatal hernia 2010   History of anemia as a child    History of kidney stones    Iron deficiency 2013   Myocardial infarction [facility]) 2012   Peripheral neuropathy    Prostate cancer (HCC) 2010   Radiation, surgery   PTSD (post-traumatic stress disorder)    Radiation proctitis    Rosacea     Past Surgical History:  Procedure Laterality Date   CARDIAC CATHETERIZATION  02/13/2011   [facility], [facility] cardiology   CATARACT EXTRACTION     2011   CATARACT EXTRACTION W/PHACO Right 08/06/2013   Procedure: CATARACT EXTRACTION PHACO AND INTRAOCULAR LENS  PLACEMENT (IOC);  Surgeon: Gemma Payor, MD;  Location: [facility];  Service: Ophthalmology;  Laterality: Right;  CDE:20.96   CHOLECYSTECTOMY     COLONOSCOPY  02/12/2004   Dr. Jena Gauss- L side diverticula, inflammatory  colon polyps   COLONOSCOPY  01/19/2012   RMR: Prominent changes involving the rectal mucosa consistent with radiation-induced proctitis. Multiple colonic  polyps removed as described above. Sigmoid Diverticulosis/ 1.5 x 2 cm relatively flat ulcerated lesion in cecum/path showed adenocarcinoma of the colon. descending colon polyp with tubular adenoma and one fragment of focal high grade dysplasia.    COLONOSCOPY  08/28/2012   [id number] proctitis. Colonic diverticulosis/Ulcerations at the surgical anastomosis  path: ulcerated colonic mucosa with prolapse changes, tubular adenoma.    COLONOSCOPY N/A 08/02/2013   RMR: Colonic polyps -removed as described above. Colonic diverticulosis. Status post reight hemicolectomy. Radiation proctitis- asymptomatic.    COLONOSCOPY N/A 05/09/2015   Procedure: COLONOSCOPY;  Surgeon: Corbin Ade, MD;  [facility];  Service: Endoscopy;  Laterality: N/A;  1200-moved to 1145 [address] to notify pt   COLONOSCOPY WITH PROPOFOL N/A 11/10/2017   Procedure: COLONOSCOPY WITH PROPOFOL;  Surgeon: Corbin Ade, MD;  [facility];  Service: Endoscopy;  Laterality: N/A;   CORONARY ANGIOPLASTY  06/17/14  OM1 POBA   CORONARY STENT PLACEMENT  01/2011   2.5x47mm Xience drug-eluting stent in ramus intermedius/OMI vessel, 2.5x64mm Promus Element stent in mid LAD artery   ESOPHAGOGASTRODUODENOSCOPY  10/10/2008   Dr. Maceo Pro hiatal hernia, normal esophagus, normal stomach   ESOPHAGOGASTRODUODENOSCOPY  02/12/2004   Dr. Geni Bers- normal    ESOPHAGOGASTRODUODENOSCOPY N/A 05/09/2015   Procedure: ESOPHAGOGASTRODUODENOSCOPY (EGD);  Surgeon: Corbin Ade, MD;  [facility];  Service: Endoscopy;  Laterality: N/A;   ESOPHAGOGASTRODUODENOSCOPY (EGD) WITH  PROPOFOL N/A 11/10/2017   Procedure: ESOPHAGOGASTRODUODENOSCOPY (EGD) WITH PROPOFOL;  Surgeon: Corbin Ade, MD;  [facility];  Service: Endoscopy;  Laterality: N/A;  9:45am   GIVENS CAPSULE STUDY N/A 06/03/2015   Procedure: GIVENS CAPSULE STUDY;  Surgeon: Corbin Ade, MD;  [facility];  Service: Endoscopy;  Laterality: N/A;  0700   HOT HEMOSTASIS  11/10/2017   Procedure: HOT HEMOSTASIS (ARGON PLASMA COAGULATION/BICAP);  Surgeon: Corbin Ade, MD;  [facility];  Service: Endoscopy;;  rectum   KNEE SURGERY     left knee    LEFT HEART CATHETERIZATION WITH CORONARY ANGIOGRAM N/A 06/17/2014   Procedure: LEFT HEART CATHETERIZATION WITH CORONARY ANGIOGRAM;  Surgeon: Kathleene Hazel, MD;  Location: [facility] CATH LAB;  Service: Cardiovascular;  Laterality: N/A;   PARTIAL COLECTOMY  02/29/2012   Procedure: PARTIAL COLECTOMY;  Surgeon: Ernestene Mention, MD;  Location: [facility];  Service: General;  Laterality: Right;   POLYPECTOMY  11/10/2017   Procedure: POLYPECTOMY;  Surgeon: Corbin Ade, MD;  [facility];  Service: Endoscopy;;  gastric colon   PROSTATECTOMY     TONSILLECTOMY     TRANSURETHRAL RESECTION OF PROSTATE     VASECTOMY      Family History  Problem Relation Age of Onset   Throat cancer Father        dx in his 56s and again in his 42s; pipe and cigar smoker   Neuropathy Brother    Prostate cancer Brother 44   Melanoma Brother        dx in his 50s   Cancer Paternal Uncle        smoking related cancer   Heart attack Paternal Grandfather    Stomach cancer Cousin 26   Colon cancer Neg Hx    Social History   Tobacco Use   Smoking status: Never   Smokeless tobacco: Never  Vaping Use   Vaping status: Never Used  Substance Use Topics   Alcohol use: Yes    Comment: 1 drink every 6 months   Drug use: No    Allergies  Allergen Reactions   Shellfish Allergy Anaphylaxis   Sulfonamide Derivatives Diarrhea and Nausea Only     No outpatient medications have been marked as taking for the 08/02/23 encounter (Office Visit) with Oliver Barre, MD.    Objective: BP (!) 153/75   Pulse 63   Ht 5' 10.5" (1.791 m)   Wt 194 lb (88 kg)   BMI 27.44 kg/m   Physical Exam:  General: {General PE Findings:25791} Gait: {Gait:25792}    IMAGING: {XR Reviewed:24899}   New Medications:  No orders of the defined types were placed in this encounter.     Oliver Barre, MD  08/02/2023 11:22 AM

## 2023-08-02 NOTE — Progress Notes (Signed)
New Patient Visit  Assessment: David Pugh. is a 88 y.o. male with the following: 1. Chronic pain of right knee 2. Gait abnormality  Plan: [name]. has pain in the right knee.  It has been consistent over the past year.  Radiographs demonstrate moderate degenerative changes.  No specific injury in the right knee.  He also has difficulty with his balance, and has reportedly fallen.  He does use a cane.  I urged him to be careful.  I feel as though physical therapy may be able to help.  We will place referral for therapy.  He is also interested in a steroid injection, which was completed in clinic today.  If he has any further issues, he will contact the clinic   Procedure note injection Right knee joint   Verbal consent was obtained to inject the right knee joint  Timeout was completed to confirm the site of injection.  The skin was prepped with alcohol and ethyl chloride was sprayed at the injection site.  A 21-gauge needle was used to inject 40 mg of Depo-Medrol and 1% lidocaine (4 cc) into the right knee using an anterolateral approach.  There were no complications. A sterile bandage was applied.   Follow-up: Return if symptoms worsen or fail to improve.  Subjective:  Chief Complaint  Patient presents with   Knee Pain    R knee pain for 1 yr. Pt states he does have catching of the knee and c/o gait concerns.     History of Present Illness: David Pugh. is a 88 y.o. male who presents for evaluation of right knee pain.  He states the pain in the right knee for the past year.  No specific injury.  He uses a cane to assist with ambulation.  Occasional Tylenol.  No recent injections.  He does report that he had the gel injections done in his right knee many years ago.  He has problems with his balance.  He reports that he has peripheral neuropathy he notes grinding sensations in his right knee.  Pain is localized to the anterior aspect of the right  knee.   Review of Systems: No fevers or chills No numbness or tingling No chest pain No shortness of breath No bowel or bladder dysfunction No GI distress No headaches   Medical History:  Past Medical History:  Diagnosis Date   Abnormality of gait 12/18/2013   Arthritis    Basal cell carcinoma of scalp and skin of neck    Bilateral foot-drop 04/02/2019   Biliary dyskinesia    CAD (coronary artery disease) 2012   a. DES x2 in ramus/OM and mid LAD in 2012  b. balloon angioplaty of OM 2015   CLL (chronic lymphocytic leukemia) (HCC)    Colon cancer (HCC) 2013   Right hemicolectomy, did not tolerate chemotherapy   Colon polyps    tubular adenomas   Diverticula of colon 2010   Essential hypertension    Exposure to Agent Orange    Folic acid deficiency    Gastroparesis 2014   GERD (gastroesophageal reflux disease)    Glaucoma    Heart palpitations 2014   Hiatal hernia 2010   History of anemia as a child    History of kidney stones    Iron deficiency 2013   Myocardial infarction [facility]) 2012   Peripheral neuropathy    Prostate cancer (HCC) 2010   Radiation, surgery   PTSD (post-traumatic stress disorder)  Radiation proctitis    Rosacea     Past Surgical History:  Procedure Laterality Date   CARDIAC CATHETERIZATION  02/13/2011   [facility], [facility] cardiology   CATARACT EXTRACTION     2011   CATARACT EXTRACTION W/PHACO Right 08/06/2013   Procedure: CATARACT EXTRACTION PHACO AND INTRAOCULAR LENS PLACEMENT (IOC);  Surgeon: Gemma Payor, MD;  Location: [facility];  Service: Ophthalmology;  Laterality: Right;  CDE:20.96   CHOLECYSTECTOMY     COLONOSCOPY  02/12/2004   Dr. Jena Gauss- L side diverticula, inflammatory  colon polyps   COLONOSCOPY  01/19/2012   RMR: Prominent changes involving the rectal mucosa consistent with radiation-induced proctitis. Multiple colonic  polyps removed as described above. Sigmoid Diverticulosis/ 1.5 x 2 cm relatively flat ulcerated lesion in cecum/path  showed adenocarcinoma of the colon. descending colon polyp with tubular adenoma and one fragment of focal high grade dysplasia.    COLONOSCOPY  08/28/2012   GEX:[id number] proctitis. Colonic diverticulosis/Ulcerations at the surgical anastomosis  path: ulcerated colonic mucosa with prolapse changes, tubular adenoma.    COLONOSCOPY N/A 08/02/2013   RMR: Colonic polyps -removed as described above. Colonic diverticulosis. Status post reight hemicolectomy. Radiation proctitis- asymptomatic.    COLONOSCOPY N/A 05/09/2015   Procedure: COLONOSCOPY;  Surgeon: Corbin Ade, MD;  [facility];  Service: Endoscopy;  Laterality: N/A;  1200-moved to [address]5 [address] to notify pt   COLONOSCOPY WITH PROPOFOL N/A 11/10/2017   Procedure: COLONOSCOPY WITH PROPOFOL;  Surgeon: Corbin Ade, MD;  [facility];  Service: Endoscopy;  Laterality: N/A;   CORONARY ANGIOPLASTY  06/17/14   OM1 POBA   CORONARY STENT PLACEMENT  01/2011   2.5x86mm Xience drug-eluting stent in ramus intermedius/OMI vessel, 2.5x51mm Promus Element stent in mid LAD artery   ESOPHAGOGASTRODUODENOSCOPY  10/10/2008   Dr. Maceo Pro hiatal hernia, normal esophagus, normal stomach   ESOPHAGOGASTRODUODENOSCOPY  02/12/2004   Dr. Geni Bers- normal    ESOPHAGOGASTRODUODENOSCOPY N/A 05/09/2015   Procedure: ESOPHAGOGASTRODUODENOSCOPY (EGD);  Surgeon: Corbin Ade, MD;  [facility];  Service: Endoscopy;  Laterality: N/A;   ESOPHAGOGASTRODUODENOSCOPY (EGD) WITH PROPOFOL N/A 11/10/2017   Procedure: ESOPHAGOGASTRODUODENOSCOPY (EGD) WITH PROPOFOL;  Surgeon: Corbin Ade, MD;  [facility];  Service: Endoscopy;  Laterality: N/A;  9:45am   GIVENS CAPSULE STUDY N/A 06/03/2015   Procedure: GIVENS CAPSULE STUDY;  Surgeon: Corbin Ade, MD;  [facility];  Service: Endoscopy;  Laterality: N/A;  0700   HOT HEMOSTASIS  11/10/2017   Procedure: HOT HEMOSTASIS (ARGON PLASMA COAGULATION/BICAP);  Surgeon: Corbin Ade, MD;   [facility];  Service: Endoscopy;;  rectum   KNEE SURGERY     left knee    LEFT HEART CATHETERIZATION WITH CORONARY ANGIOGRAM N/A 06/17/2014   Procedure: LEFT HEART CATHETERIZATION WITH CORONARY ANGIOGRAM;  Surgeon: Kathleene Hazel, MD;  Location: [facility] CATH LAB;  Service: Cardiovascular;  Laterality: N/A;   PARTIAL COLECTOMY  02/29/2012   Procedure: PARTIAL COLECTOMY;  Surgeon: Ernestene Mention, MD;  Location: [facility];  Service: General;  Laterality: Right;   POLYPECTOMY  11/10/2017   Procedure: POLYPECTOMY;  Surgeon: Corbin Ade, MD;  [facility];  Service: Endoscopy;;  gastric colon   PROSTATECTOMY     TONSILLECTOMY     TRANSURETHRAL RESECTION OF PROSTATE     VASECTOMY      Family History  Problem Relation Age of Onset   Throat cancer Father        dx in his  50s and again in his 55s; pipe and cigar smoker   Neuropathy Brother    Prostate cancer Brother 46   Melanoma Brother        dx in his 57s   Cancer Paternal Uncle        smoking related cancer   Heart attack Paternal Grandfather    Stomach cancer Cousin 92   Colon cancer Neg Hx    Social History   Tobacco Use   Smoking status: Never   Smokeless tobacco: Never  Vaping Use   Vaping status: Never Used  Substance Use Topics   Alcohol use: Yes    Comment: 1 drink every 6 months   Drug use: No    Allergies  Allergen Reactions   Shellfish Allergy Anaphylaxis   Sulfonamide Derivatives Diarrhea and Nausea Only    Current Meds  Medication Sig   Apoaequorin (PREVAGEN EXTRA STRENGTH) 20 MG CAPS Take 1 capsule by mouth daily.   Azelastine-Fluticasone 137-50 MCG/ACT SUSP Place 1 spray into the nose every 12 (twelve) hours.   B Complex Vitamins (VITAMIN B COMPLEX PO) Take 1 tablet by mouth at bedtime.    cholecalciferol (VITAMIN D3) 25 MCG (1000 UNIT) tablet Take 1,000 Units by mouth 2 (two) times daily.   citalopram (CELEXA) 10 MG tablet Take 10 mg by mouth daily.   clopidogrel (PLAVIX) 75  MG tablet TAKE 1 TABLET BY MOUTH ONCE DAILY.   folic acid (FOLVITE) 1 MG tablet TAKE (1) TABLET BY MOUTH ONCE DAILY . (Patient taking differently: Take 1 mg by mouth daily.)   ibuprofen (ADVIL,MOTRIN) 200 MG tablet Take 400 mg by mouth daily as needed for headache or moderate pain.   latanoprost (XALATAN) 0.005 % ophthalmic solution 1 DROP IN [facility] EYE AT BEDTIME.   losartan (COZAAR) 50 MG tablet Take 25 mg by mouth 2 (two) times daily.    memantine (NAMENDA) 10 MG tablet Take 1 tablet (10 mg total) by mouth 2 (two) times daily.   metoprolol (LOPRESSOR) 50 MG tablet Take 50 mg by mouth 2 (two) times daily.   montelukast (SINGULAIR) 10 MG tablet Take 10 mg by mouth at bedtime.   Multiple Vitamin (MULTIVITAMIN WITH MINERALS) TABS tablet Take 1 tablet by mouth daily.   pantoprazole (PROTONIX) 40 MG tablet TAKE (1) TABLET TWICE DAILY. (Patient taking differently: Take 40 mg by mouth daily.)   polyethylene glycol (MIRALAX / GLYCOLAX) 17 g packet Take 17 g by mouth daily.   pravastatin (PRAVACHOL) 40 MG tablet take 1 tablet by mouth at bedtime.   rivastigmine (EXELON) 1.5 MG capsule Take 1 capsule (1.5 mg total) by mouth 2 (two) times daily.   vitamin B-12 (CYANOCOBALAMIN) 1000 MCG tablet Take by mouth daily.     Objective: BP (!) 153/75   Pulse 63   Ht 5' 10.5" (1.791 m)   Wt 194 lb (88 kg)   BMI 27.44 kg/m   Physical Exam:  General: Alert and oriented. and No acute distress. Gait: Ambulates with the assistance of a cane.  Evaluation of the right knee demonstrates no swelling.  No redness.  Crepitus with range of motion.  Just short of full extension, tolerating greater than 110 degrees of flexion.  No increased laxity varus or valgus stress.  Negative Lachman.  Mild tenderness to palpation along the medial joint line.  IMAGING: I personally ordered and reviewed the following images   X-rays of the right knee were obtained in clinic today.  No acute injuries noted.  Neutral  overall  alignment.  Well-maintained joint space.  There are some osteophytes within the medial lateral compartment.  Within the patellofemoral compartment, there is loss of joint space, as well as medial and lateral facet osteophytes.  On the lateral view, superior and inferior osteophytes are appreciated on the patella.  Impression: Mild to moderate degenerative changes of the right knee.  New Medications:  No orders of the defined types were placed in this encounter.     Oliver Barre, MD  08/02/2023 1:22 PM

## 2023-08-02 NOTE — Patient Instructions (Signed)

## 2023-08-18 ENCOUNTER — Emergency Department (HOSPITAL_COMMUNITY): Payer: Medicare PPO

## 2023-08-18 ENCOUNTER — Observation Stay (HOSPITAL_COMMUNITY): Payer: Medicare PPO

## 2023-08-18 ENCOUNTER — Encounter (HOSPITAL_COMMUNITY): Payer: Self-pay | Admitting: *Deleted

## 2023-08-18 ENCOUNTER — Observation Stay (HOSPITAL_COMMUNITY)
Admission: EM | Admit: 2023-08-18 | Discharge: 2023-08-20 | Disposition: A | Discharge status: Home / Self Care | Payer: Medicare PPO | Attending: Emergency Medicine | Admitting: Emergency Medicine

## 2023-08-18 DIAGNOSIS — Z955 Presence of coronary angioplasty implant and graft: Secondary | ICD-10-CM | POA: Diagnosis not present

## 2023-08-18 DIAGNOSIS — Z79899 Other long term (current) drug therapy: Secondary | ICD-10-CM | POA: Diagnosis not present

## 2023-08-18 DIAGNOSIS — Z85038 Personal history of other malignant neoplasm of large intestine: Secondary | ICD-10-CM | POA: Diagnosis not present

## 2023-08-18 DIAGNOSIS — R269 Unspecified abnormalities of gait and mobility: Secondary | ICD-10-CM

## 2023-08-18 DIAGNOSIS — R2681 Unsteadiness on feet: Secondary | ICD-10-CM | POA: Insufficient documentation

## 2023-08-18 DIAGNOSIS — M6281 Muscle weakness (generalized): Secondary | ICD-10-CM | POA: Insufficient documentation

## 2023-08-18 DIAGNOSIS — I10 Essential (primary) hypertension: Secondary | ICD-10-CM | POA: Insufficient documentation

## 2023-08-18 DIAGNOSIS — E785 Hyperlipidemia, unspecified: Secondary | ICD-10-CM

## 2023-08-18 DIAGNOSIS — R531 Weakness: Principal | ICD-10-CM | POA: Insufficient documentation

## 2023-08-18 DIAGNOSIS — N138 Other obstructive and reflux uropathy: Secondary | ICD-10-CM | POA: Diagnosis present

## 2023-08-18 DIAGNOSIS — U071 COVID-19: Secondary | ICD-10-CM | POA: Diagnosis not present

## 2023-08-18 DIAGNOSIS — I2510 Atherosclerotic heart disease of native coronary artery without angina pectoris: Secondary | ICD-10-CM | POA: Diagnosis present

## 2023-08-18 DIAGNOSIS — Z85828 Personal history of other malignant neoplasm of skin: Secondary | ICD-10-CM | POA: Insufficient documentation

## 2023-08-18 DIAGNOSIS — N401 Enlarged prostate with lower urinary tract symptoms: Secondary | ICD-10-CM | POA: Diagnosis present

## 2023-08-18 DIAGNOSIS — R2689 Other abnormalities of gait and mobility: Secondary | ICD-10-CM | POA: Diagnosis not present

## 2023-08-18 DIAGNOSIS — Z8546 Personal history of malignant neoplasm of prostate: Secondary | ICD-10-CM | POA: Insufficient documentation

## 2023-08-18 DIAGNOSIS — Z7902 Long term (current) use of antithrombotics/antiplatelets: Secondary | ICD-10-CM | POA: Insufficient documentation

## 2023-08-18 DIAGNOSIS — C9110 Chronic lymphocytic leukemia of B-cell type not having achieved remission: Secondary | ICD-10-CM | POA: Diagnosis not present

## 2023-08-18 DIAGNOSIS — W19XXXA Unspecified fall, initial encounter: Secondary | ICD-10-CM | POA: Diagnosis not present

## 2023-08-18 DIAGNOSIS — M21371 Foot drop, right foot: Secondary | ICD-10-CM | POA: Diagnosis present

## 2023-08-18 DIAGNOSIS — R29898 Other symptoms and signs involving the musculoskeletal system: Principal | ICD-10-CM

## 2023-08-18 LAB — CBC WITH DIFFERENTIAL/PLATELET
Abs Immature Granulocytes: 0 10*3/uL (ref 0.00–0.07)
Band Neutrophils: 8 %
Basophils Absolute: 0 10*3/uL (ref 0.0–0.1)
Basophils Relative: 0 %
Eosinophils Absolute: 0 10*3/uL (ref 0.0–0.5)
Eosinophils Relative: 0 %
HCT: 40.7 % (ref 39.0–52.0)
Hemoglobin: 13.7 g/dL (ref 13.0–17.0)
Lymphocytes Relative: 11 %
Lymphs Abs: 2.2 10*3/uL (ref 0.7–4.0)
MCH: 31.8 pg (ref 26.0–34.0)
MCHC: 33.7 g/dL (ref 30.0–36.0)
MCV: 94.4 fL (ref 80.0–100.0)
Monocytes Absolute: 2.4 10*3/uL — ABNORMAL HIGH (ref 0.1–1.0)
Monocytes Relative: 12 %
Neutro Abs: 15.6 10*3/uL — ABNORMAL HIGH (ref 1.7–7.7)
Neutrophils Relative %: 69 %
Platelets: 277 10*3/uL (ref 150–400)
RBC: 4.31 MIL/uL (ref 4.22–5.81)
RDW: 12.7 % (ref 11.5–15.5)
WBC: 20.3 10*3/uL — ABNORMAL HIGH (ref 4.0–10.5)
nRBC: 0 % (ref 0.0–0.2)

## 2023-08-18 LAB — URINALYSIS, W/ REFLEX TO CULTURE (INFECTION SUSPECTED)
Bilirubin Urine: NEGATIVE
Glucose, UA: NEGATIVE mg/dL
Ketones, ur: 5 mg/dL — AB
Leukocytes,Ua: NEGATIVE
Nitrite: NEGATIVE
Protein, ur: 30 mg/dL — AB
Specific Gravity, Urine: 1.021 (ref 1.005–1.030)
pH: 5 (ref 5.0–8.0)

## 2023-08-18 LAB — COMPREHENSIVE METABOLIC PANEL
ALT: 27 U/L (ref 0–44)
AST: 30 U/L (ref 15–41)
Albumin: 3.6 g/dL (ref 3.5–5.0)
Alkaline Phosphatase: 58 U/L (ref 38–126)
Anion gap: 10 (ref 5–15)
BUN: 16 mg/dL (ref 8–23)
CO2: 22 mmol/L (ref 22–32)
Calcium: 8.8 mg/dL — ABNORMAL LOW (ref 8.9–10.3)
Chloride: 104 mmol/L (ref 98–111)
Creatinine, Ser: 1.14 mg/dL (ref 0.61–1.24)
GFR, Estimated: >60 mL/min (ref >60)
Glucose, Bld: 120 mg/dL — ABNORMAL HIGH (ref 70–99)
Potassium: 3.8 mmol/L (ref 3.5–5.1)
Sodium: 136 mmol/L (ref 135–145)
Total Bilirubin: 1 mg/dL (ref <1.2)
Total Protein: 6.4 g/dL — ABNORMAL LOW (ref 6.5–8.1)

## 2023-08-18 LAB — PROTIME-INR
INR: 1.1 (ref 0.8–1.2)
Prothrombin Time: 14.5 s (ref 11.4–15.2)

## 2023-08-18 LAB — APTT: aPTT: 29 s (ref 24–36)

## 2023-08-18 MED ORDER — RIVASTIGMINE TARTRATE 1.5 MG PO CAPS
1.5000 mg | ORAL_CAPSULE | Freq: Two times a day (BID) | ORAL | Status: DC
  Administered 2023-08-19 – 2023-08-20 (×3): 1.5 mg via ORAL
  Filled 2023-08-18 (×8): qty 1

## 2023-08-18 MED ORDER — LATANOPROST 0.005 % OP SOLN
1.0000 [drp] | Freq: Every day | OPHTHALMIC | Status: DC
  Administered 2023-08-19: 1 [drp] via OPHTHALMIC
  Filled 2023-08-18: qty 2.5

## 2023-08-18 MED ORDER — VITAMIN B COMPLEX PO CAPS: ORAL_CAPSULE | Freq: Every day | ORAL | Status: DC

## 2023-08-18 MED ORDER — CITALOPRAM HYDROBROMIDE 20 MG PO TABS
10.0000 mg | ORAL_TABLET | Freq: Every day | ORAL | Status: DC
Start: 2023-08-19 — End: 2023-08-20
  Administered 2023-08-19 – 2023-08-20 (×2): 10 mg via ORAL
  Filled 2023-08-18 (×2): qty 1

## 2023-08-18 MED ORDER — MEMANTINE HCL 10 MG PO TABS
10.0000 mg | ORAL_TABLET | Freq: Two times a day (BID) | ORAL | Status: DC
  Administered 2023-08-19 – 2023-08-20 (×3): 10 mg via ORAL
  Filled 2023-08-18 (×3): qty 1

## 2023-08-18 MED ORDER — PANTOPRAZOLE SODIUM 40 MG PO TBEC
40.0000 mg | DELAYED_RELEASE_TABLET | Freq: Every day | ORAL | Status: DC
  Administered 2023-08-19 – 2023-08-20 (×2): 40 mg via ORAL
  Filled 2023-08-18 (×2): qty 1

## 2023-08-18 MED ORDER — ADULT MULTIVITAMIN W/MINERALS CH
1.0000 | ORAL_TABLET | Freq: Every day | ORAL | Status: DC
  Administered 2023-08-19 – 2023-08-20 (×2): 1 via ORAL
  Filled 2023-08-18 (×2): qty 1

## 2023-08-18 MED ORDER — GUAIFENESIN ER 600 MG PO TB12: 600.0000 mg | ORAL_TABLET | Freq: Two times a day (BID) | ORAL | Status: DC | PRN

## 2023-08-18 MED ORDER — FLUTICASONE PROPIONATE 50 MCG/ACT NA SUSP
1.0000 | Freq: Two times a day (BID) | NASAL | Status: DC
Start: 2023-08-18 — End: 2023-08-20
  Administered 2023-08-19 – 2023-08-20 (×3): 1 via NASAL
  Filled 2023-08-18 (×2): qty 16

## 2023-08-18 MED ORDER — HYDRALAZINE HCL 20 MG/ML IJ SOLN: 5.0000 mg | INTRAMUSCULAR | Status: DC | PRN

## 2023-08-18 MED ORDER — APOAEQUORIN 20 MG PO CAPS: 1.0000 | ORAL_CAPSULE | Freq: Every day | ORAL | Status: DC

## 2023-08-18 MED ORDER — MONTELUKAST SODIUM 10 MG PO TABS
10.0000 mg | ORAL_TABLET | Freq: Every day | ORAL | Status: DC
  Administered 2023-08-19: 10 mg via ORAL
  Filled 2023-08-18: qty 1

## 2023-08-18 MED ORDER — ALBUTEROL SULFATE (2.5 MG/3ML) 0.083% IN NEBU: 2.5000 mg | INHALATION_SOLUTION | RESPIRATORY_TRACT | Status: DC | PRN

## 2023-08-18 MED ORDER — PRAVASTATIN SODIUM 40 MG PO TABS
40.0000 mg | ORAL_TABLET | Freq: Every day | ORAL | Status: DC
Start: 2023-08-18 — End: 2023-08-20
  Administered 2023-08-19: 40 mg via ORAL
  Filled 2023-08-18: qty 1

## 2023-08-18 MED ORDER — VITAMIN B-12 1000 MCG PO TABS
1000.0000 ug | ORAL_TABLET | Freq: Every day | ORAL | Status: DC
  Administered 2023-08-19 – 2023-08-20 (×2): 1000 ug via ORAL
  Filled 2023-08-18 (×2): qty 1

## 2023-08-18 MED ORDER — CLOPIDOGREL BISULFATE 75 MG PO TABS
75.0000 mg | ORAL_TABLET | Freq: Every day | ORAL | Status: DC
  Administered 2023-08-19 – 2023-08-20 (×2): 75 mg via ORAL
  Filled 2023-08-18 (×2): qty 1

## 2023-08-18 MED ORDER — HEPARIN SODIUM (PORCINE) 5000 UNIT/ML IJ SOLN
5000.0000 [IU] | Freq: Three times a day (TID) | INTRAMUSCULAR | Status: DC
Start: 2023-08-19 — End: 2023-08-20
  Administered 2023-08-19 – 2023-08-20 (×4): 5000 [IU] via SUBCUTANEOUS
  Filled 2023-08-18 (×4): qty 1

## 2023-08-18 MED ORDER — FOLIC ACID 1 MG PO TABS
1.0000 mg | ORAL_TABLET | Freq: Every day | ORAL | Status: DC
Start: 2023-08-19 — End: 2023-08-20
  Administered 2023-08-19: 1 mg via ORAL
  Filled 2023-08-18 (×2): qty 1

## 2023-08-18 MED ORDER — METOPROLOL TARTRATE 50 MG PO TABS
50.0000 mg | ORAL_TABLET | Freq: Two times a day (BID) | ORAL | Status: DC
  Administered 2023-08-19 – 2023-08-20 (×3): 50 mg via ORAL
  Filled 2023-08-18 (×3): qty 1

## 2023-08-18 MED ORDER — LOSARTAN POTASSIUM 50 MG PO TABS
25.0000 mg | ORAL_TABLET | Freq: Two times a day (BID) | ORAL | Status: DC
Start: 2023-08-18 — End: 2023-08-20
  Administered 2023-08-19 – 2023-08-20 (×3): 25 mg via ORAL
  Filled 2023-08-18 (×3): qty 1

## 2023-08-18 MED ORDER — ACETAMINOPHEN 500 MG PO TABS: 500.0000 mg | ORAL_TABLET | Freq: Four times a day (QID) | ORAL | Status: DC | PRN

## 2023-08-18 MED ORDER — AZELASTINE HCL 0.1 % NA SOLN
1.0000 | Freq: Two times a day (BID) | NASAL | Status: DC
  Administered 2023-08-19 – 2023-08-20 (×3): 1 via NASAL
  Filled 2023-08-18: qty 30

## 2023-08-18 MED ORDER — NITROGLYCERIN 0.4 MG SL SUBL: 0.4000 mg | SUBLINGUAL_TABLET | SUBLINGUAL | Status: DC | PRN

## 2023-08-18 MED ORDER — B COMPLEX-C PO TABS
1.0000 | ORAL_TABLET | Freq: Every day | ORAL | Status: DC
Start: 2023-08-18 — End: 2023-08-20
  Administered 2023-08-19: 1 via ORAL
  Filled 2023-08-18: qty 1

## 2023-08-18 MED ORDER — AZELASTINE-FLUTICASONE 137-50 MCG/ACT NA SUSP: 1.0000 | Freq: Two times a day (BID) | NASAL | Status: DC

## 2023-08-18 MED ORDER — VITAMIN D 25 MCG (1000 UNIT) PO TABS
1000.0000 [IU] | ORAL_TABLET | Freq: Two times a day (BID) | ORAL | Status: DC
  Administered 2023-08-19 – 2023-08-20 (×3): 1000 [IU] via ORAL
  Filled 2023-08-18 (×3): qty 1

## 2023-08-18 NOTE — H&P (Signed)
TRH H&P   Patient Demographics:    David Pugh, is a 88 y.o. male  MRN: [id number]   DOB - 10/19/1934  Admit Date - 08/18/2023  Outpatient Primary MD for the patient is Billie Lade, MD  Referring MD/NP/PA: Lenora Boys  Patient coming from: home  Chief Complaint  Patient presents with   Extremity Weakness      HPI:    David Pugh  is a 88 y.o. male, with past medical history significant for hypertension, CLL, CAD, peripheral neuropathy, bilateral foot drop, T8 cancer, who presents to ED with complaints of multiple falls, reports he has started physical therapy last week, he had multiple sessions, he was given a few exercises to do he thinks he has been doing them excessively, and reports multiple falls, reports he walks when he feels his legs suddenly give out on him, no dizziness, no lightheadedness, no focal deficits, for his feeling of balance before falling out, no chest pain, no shortness of breath, no head trauma, he reports chronic pain in the right hip, with known history of peripheral neuropathy, bilateral foot drop, he denies any bladder or bowel continence or lose control. -In ED MRI brain obtained, with no evidence of acute CVA, white blood cell is elevated at 20.3 K, which is at baseline for his known CLL, UA was negative, given multiple falls [facility] hospitalist consulted to admit.  Review of systems:      A full 10 point Review of Systems was done, except as stated above, all other Review of Systems were negative.   With Past History of the following :    Past Medical History:  Diagnosis Date   Abnormality of gait 12/18/2013   Arthritis    Basal cell carcinoma of scalp and skin of neck    Bilateral foot-drop 04/02/2019   Biliary dyskinesia    CAD (coronary artery disease) 2012   a. DES x2 in ramus/OM and mid LAD in 2012  b. balloon angioplaty of OM  2015   CLL (chronic lymphocytic leukemia) (HCC)    Colon cancer (HCC) 2013   Right hemicolectomy, did not tolerate chemotherapy   Colon polyps    tubular adenomas   Diverticula of colon 2010   Essential hypertension    Exposure to Agent Orange    Folic acid deficiency    Gastroparesis 2014   GERD (gastroesophageal reflux disease)    Glaucoma    Heart palpitations 2014   Hiatal hernia 2010   History of anemia as a child    History of kidney stones    Iron deficiency 2013   Myocardial infarction [facility]) 2012   Peripheral neuropathy    Prostate cancer (HCC) 2010   Radiation, surgery   PTSD (post-traumatic stress disorder)    Radiation proctitis    Rosacea       Past Surgical History:  Procedure Laterality Date  CARDIAC CATHETERIZATION  02/13/2011   [facility] cardiology   CATARACT EXTRACTION     2011   CATARACT EXTRACTION W/PHACO Right 08/06/2013   Procedure: CATARACT EXTRACTION PHACO AND INTRAOCULAR LENS PLACEMENT (IOC);  Surgeon: Gemma Payor, MD;  Location: [facility];  Service: Ophthalmology;  Laterality: Right;  CDE:20.96   CHOLECYSTECTOMY     COLONOSCOPY  02/12/2004   Dr. Jena Gauss- L side diverticula, inflammatory  colon polyps   COLONOSCOPY  01/19/2012   RMR: Prominent changes involving the rectal mucosa consistent with radiation-induced proctitis. Multiple colonic  polyps removed as described above. Sigmoid Diverticulosis/ 1.5 x 2 cm relatively flat ulcerated lesion in cecum/path showed adenocarcinoma of the colon. descending colon polyp with tubular adenoma and one fragment of focal high grade dysplasia.    COLONOSCOPY  08/28/2012   ZOX:[id number] proctitis. Colonic diverticulosis/Ulcerations at the surgical anastomosis  path: ulcerated colonic mucosa with prolapse changes, tubular adenoma.    COLONOSCOPY N/A 08/02/2013   RMR: Colonic polyps -removed as described above. Colonic diverticulosis. Status post reight hemicolectomy. Radiation proctitis- asymptomatic.     COLONOSCOPY N/A 05/09/2015   Procedure: COLONOSCOPY;  Surgeon: Corbin Ade, MD;  Location: [facility];  Service: Endoscopy;  Laterality: N/A;  1200-moved to [address]5 [address] to notify pt   COLONOSCOPY WITH PROPOFOL N/A 11/10/2017   Procedure: COLONOSCOPY WITH PROPOFOL;  Surgeon: Corbin Ade, MD;  Location: [facility];  Service: Endoscopy;  Laterality: N/A;   CORONARY ANGIOPLASTY  06/17/14   OM1 POBA   CORONARY STENT PLACEMENT  01/2011   2.5x78mm Xience drug-eluting stent in ramus intermedius/OMI vessel, 2.5x43mm Promus Element stent in mid LAD artery   ESOPHAGOGASTRODUODENOSCOPY  10/10/2008   Dr. Maceo Pro hiatal hernia, normal esophagus, normal stomach   ESOPHAGOGASTRODUODENOSCOPY  02/12/2004   Dr. Geni Bers- normal    ESOPHAGOGASTRODUODENOSCOPY N/A 05/09/2015   Procedure: ESOPHAGOGASTRODUODENOSCOPY (EGD);  Surgeon: Corbin Ade, MD;  [facility];  Service: Endoscopy;  Laterality: N/A;   ESOPHAGOGASTRODUODENOSCOPY (EGD) WITH PROPOFOL N/A 11/10/2017   Procedure: ESOPHAGOGASTRODUODENOSCOPY (EGD) WITH PROPOFOL;  Surgeon: Corbin Ade, MD;  Location: [facility];  Service: Endoscopy;  Laterality: N/A;  9:45am   GIVENS CAPSULE STUDY N/A 06/03/2015   Procedure: GIVENS CAPSULE STUDY;  Surgeon: Corbin Ade, MD;  [facility]: [facility];  Service: Endoscopy;  Laterality: N/A;  0700   HOT HEMOSTASIS  11/10/2017   Procedure: HOT HEMOSTASIS (ARGON PLASMA COAGULATION/BICAP);  Surgeon: Corbin Ade, MD;  [facility];  Service: Endoscopy;;  rectum   KNEE SURGERY     left knee    LEFT HEART CATHETERIZATION WITH CORONARY ANGIOGRAM N/A 06/17/2014   Procedure: LEFT HEART CATHETERIZATION WITH CORONARY ANGIOGRAM;  Surgeon: Kathleene Hazel, MD;  Location: [facility] CATH LAB;  Service: Cardiovascular;  Laterality: N/A;   PARTIAL COLECTOMY  02/29/2012   Procedure: PARTIAL COLECTOMY;  Surgeon: Ernestene Mention, MD;  Location: [facility];  Service: General;  Laterality: Right;   POLYPECTOMY   11/10/2017   Procedure: POLYPECTOMY;  Surgeon: Corbin Ade, MD;  Location: [facility];  Service: Endoscopy;;  gastric colon   PROSTATECTOMY     TONSILLECTOMY     TRANSURETHRAL RESECTION OF PROSTATE     VASECTOMY        Social History:     Social History   Tobacco Use   Smoking status: Never   Smokeless tobacco: Never  Substance Use Topics   Alcohol use: Yes    Comment: 1 drink every 6 months  Family History :     Family History  Problem Relation Age of Onset   Throat cancer Father        dx in his 21s and again in his 2s; pipe and cigar smoker   Neuropathy Brother    Prostate cancer Brother 78   Melanoma Brother        dx in his 40s   Cancer Paternal Uncle        smoking related cancer   Heart attack Paternal Grandfather    Stomach cancer Cousin 54   Colon cancer Neg Hx     Home Medications:   Prior to Admission medications   Medication Sig Start Date End Date Taking? Authorizing Provider  acetaminophen (TYLENOL) 500 MG tablet Take 500 mg by mouth every 6 (six) hours as needed for mild pain (pain score 1-3).   Yes [provider]  Apoaequorin (PREVAGEN EXTRA STRENGTH) 20 MG CAPS Take 1 capsule by mouth daily.   Yes [provider]  Azelastine-Fluticasone 137-50 MCG/ACT SUSP Place 1 spray into the nose every 12 (twelve) hours. 07/15/23  Yes Billie Lade, MD  B Complex Vitamins (VITAMIN B COMPLEX PO) Take 1 tablet by mouth at bedtime.    Yes [provider]  cholecalciferol (VITAMIN D3) 25 MCG (1000 UNIT) tablet Take 1,000 Units by mouth 2 (two) times daily.   Yes [provider]  citalopram (CELEXA) 10 MG tablet Take 10 mg by mouth daily.   Yes [provider]  clopidogrel (PLAVIX) 75 MG tablet TAKE 1 TABLET BY MOUTH ONCE DAILY. 01/20/23  Yes Jonelle Sidle, MD  folic acid (FOLVITE) 1 MG tablet TAKE (1) TABLET BY MOUTH ONCE DAILY . Patient taking differently: Take 1 mg by mouth daily. 07/13/17  Yes  Hubbard Hartshorn, NP  guaiFENesin (MUCINEX) 600 MG 12 hr tablet Take 600 mg by mouth 2 (two) times daily as needed for cough or to loosen phlegm.   Yes [provider]  latanoprost (XALATAN) 0.005 % ophthalmic solution 1 DROP IN [facility] EYE AT BEDTIME. 02/16/22  Yes Rankin, Alford Highland, MD  losartan (COZAAR) 50 MG tablet Take 25 mg by mouth 2 (two) times daily.    Yes [provider]  memantine (NAMENDA) 10 MG tablet Take 1 tablet (10 mg total) by mouth 2 (two) times daily. 11/25/22 11/20/23 Yes Camara, Amalia Hailey, MD  metoprolol (LOPRESSOR) 50 MG tablet Take 50 mg by mouth 2 (two) times daily.   Yes [provider]  montelukast (SINGULAIR) 10 MG tablet Take 10 mg by mouth at bedtime.   Yes [provider]  Multiple Vitamin (MULTIVITAMIN WITH MINERALS) TABS tablet Take 1 tablet by mouth daily.   Yes [provider]  nitroGLYCERIN (NITROSTAT) 0.4 MG SL tablet Place 1 tablet (0.4 mg total) under the tongue every 5 (five) minutes x 3 doses as needed for chest pain (if no relief after 3rd dose, proceed to ED or call 911). 04/04/23  Yes Jonelle Sidle, MD  pantoprazole (PROTONIX) 40 MG tablet TAKE (1) TABLET TWICE DAILY. Patient taking differently: Take 40 mg by mouth daily. 01/04/21  Yes Gelene Mink, NP  pravastatin (PRAVACHOL) 40 MG tablet take 1 tablet by mouth at bedtime. 07/04/23  Yes Jonelle Sidle, MD  rivastigmine (EXELON) 1.5 MG capsule Take 1 capsule (1.5 mg total) by mouth 2 (two) times daily. 11/25/22 11/20/23 Yes Windell Norfolk, MD  vitamin B-12 (CYANOCOBALAMIN) 1000 MCG tablet Take 1,000 mcg by mouth daily.  Yes [provider]     Allergies:     Allergies  Allergen Reactions   Shellfish Allergy Anaphylaxis   Sulfonamide Derivatives Diarrhea and Nausea Only     Physical Exam:   Vitals  Blood pressure (!) 117/59, pulse 71, temperature 98.5 F (36.9 C), temperature source Oral, resp. rate 13, SpO2 97%.   1. General Elderly male,  laying in bed, no apparent distress  2.  Is appropriate, answering questions appropriately, some delayed response and mild confusion  3. No F.N deficits, ALL C.Nerves Intact,   4. Ears and Eyes appear Normal, Conjunctivae clear, PERRLA. Moist Oral Mucosa.  5. Supple Neck, No JVD, No cervical lymphadenopathy appriciated, No Carotid Bruits.  6. Symmetrical Chest wall movement, Good air movement bilaterally, CTAB.  7. RRR, No Gallops, Rubs or Murmurs, No Parasternal Heave.  8. Positive Bowel Sounds, Abdomen Soft, No tenderness, No organomegaly appriciated,No rebound -guarding or rigidity.  9.  No Cyanosis, Normal Skin Turgor, No Skin Rash or Bruise.  10.  Patient range of motion limited secondary to severe pain, mainly in right lower extremity given to right hip pain but able to achieve 4/5 right lower extremity.    Data Review:    CBC Recent Labs  Lab 08/18/23 1450  WBC 20.3*  HGB 13.7  HCT 40.7  PLT 277  MCV 94.4  MCH 31.8  MCHC 33.7  RDW 12.7  LYMPHSABS 2.2  MONOABS 2.4*  EOSABS 0.0  BASOSABS 0.0   ------------------------------------------------------------------------------------------------------------------  Chemistries  Recent Labs  Lab 08/18/23 1450  NA 136  K 3.8  CL 104  CO2 22  GLUCOSE 120*  BUN 16  CREATININE 1.14  CALCIUM 8.8*  AST 30  ALT 27  ALKPHOS 58  BILITOT 1.0   ------------------------------------------------------------------------------------------------------------------ CrCl cannot be calculated (Unknown ideal weight.). ------------------------------------------------------------------------------------------------------------------ No results for input(s): "TSH", "T4TOTAL", "T3FREE", "THYROIDAB" in the last 72 hours.  Invalid input(s): "FREET3"  Coagulation profile Recent Labs  Lab 08/18/23 1549  INR 1.1    ------------------------------------------------------------------------------------------------------------------- No results for input(s): "DDIMER" in the last 72 hours. -------------------------------------------------------------------------------------------------------------------  Cardiac Enzymes No results for input(s): "CKMB", "TROPONINI", "MYOGLOBIN" in the last 168 hours.  Invalid input(s): "CK" ------------------------------------------------------------------------------------------------------------------ No results found for: "BNP"   ---------------------------------------------------------------------------------------------------------------  Urinalysis    Component Value Date/Time   COLORURINE YELLOW 08/18/2023 1840   APPEARANCEUR CLEAR 08/18/2023 1840   LABSPEC 1.021 08/18/2023 1840   PHURINE 5.0 08/18/2023 1840   GLUCOSEU NEGATIVE 08/18/2023 1840   HGBUR SMALL (A) 08/18/2023 1840   BILIRUBINUR NEGATIVE 08/18/2023 1840   KETONESUR 5 (A) 08/18/2023 1840   PROTEINUR 30 (A) 08/18/2023 1840   UROBILINOGEN 0.2 11/24/2013 1730   NITRITE NEGATIVE 08/18/2023 1840   LEUKOCYTESUR NEGATIVE 08/18/2023 1840    ----------------------------------------------------------------------------------------------------------------   Imaging Results:    DG Chest Portable 1 View Result Date: 08/18/2023 CLINICAL DATA:  Worsening productive cough. EXAM: PORTABLE CHEST 1 VIEW COMPARISON:  Chest x-ray dated May 01, 2021. FINDINGS: Stable cardiomediastinal silhouette with normal heart size. Normal pulmonary vascularity. Minimal linear scarring at both lung bases again noted. No focal consolidation, pleural effusion, or pneumothorax. No acute osseous abnormality. IMPRESSION: No active disease. Electronically Signed   By: Obie Dredge M.D.   On: 08/18/2023 18:25   DG HIP UNILAT WITH PELVIS 2-3 VIEWS RIGHT Result Date: 08/18/2023 CLINICAL DATA:  Right hip pain and weakness.   Recent falls. EXAM: DG HIP (WITH OR WITHOUT PELVIS) 2-3V RIGHT COMPARISON:  CT abdomen pelvis dated May 01, 2021. FINDINGS: No acute  fracture or dislocation. Mild bilateral hip joint space narrowing with small marginal osteophytes. Soft tissues are unremarkable. IMPRESSION: 1. Mild bilateral hip osteoarthritis. Electronically Signed   By: Obie Dredge M.D.   On: 08/18/2023 18:23   MR BRAIN WO CONTRAST Result Date: 08/18/2023 CLINICAL DATA:  Neuro deficit, acute, stroke suspected. Right lower extremity weakness. EXAM: MRI HEAD WITHOUT CONTRAST TECHNIQUE: Multiplanar, multiecho pulse sequences of the brain and surrounding structures were obtained without intravenous contrast. COMPARISON:  Head CT 07/01/2016 FINDINGS: Brain: There is no evidence of an acute infarct, intracranial hemorrhage, mass, midline shift, or extra-axial fluid collection. Mild generalized cerebral atrophy is within normal for age. Small T2 hyperintensities in the cerebral white matter nonspecific but compatible with mild chronic small vessel ischemic disease. Vascular: Major intracranial vascular flow voids are preserved. Skull and upper cervical spine: Unremarkable bone marrow signal. Sinuses/Orbits: Bilateral cataract extraction. Paranasal sinuses and mastoid air cells are clear. Other: 9 mm T2 hyperintense/cystic focus superficial to the left parotid gland. IMPRESSION: 1. No acute intracranial abnormality. 2. Mild chronic small vessel ischemic disease. Electronically Signed   By: Sebastian Ache M.D.   On: 08/18/2023 18:08       Assessment & Plan:    Principal Problem:   Fall Active Problems:   CAD (coronary artery disease)   Benign prostatic hyperplasia with urinary obstruction   Bilateral foot-drop    Multiple falls Unsteady gait Deconditioning -Patient presents with multiple falls, significant right hip pain he is with known history of peripheral neuropathy, bilateral feet drop, and osteoarthritis. -MRI brain  with no evidence of acute CVA. -MRI lumbar spine is pending, patient with no incontinence. -His falls and limited range of motion appear to be mainly due to pain, he is with evidence of significant osteoarthritis in bilateral hips and knee (x-ray 08-02-2023) -Consult PT-OT -continue with as needed Tylenol for pain, try to avoid narcotics.  CAD -status post PCI in 2012, 2015 -Continue with Plavix, Cozaar, Lopressor and Pravachol  Hypertension -Blood pressure well-controlled, continue with home meds  Hyperlipidemia -Continue with home statin  GERD - continue with PPI   DVT Prophylaxis Heparin   AM Labs Ordered, also please review Full Orders  Family Communication: Admission, patients condition and plan of care including tests being ordered have been discussed with the patient (I have tried to reach wife and both available phone number, left voicemail) who indicate understanding and agree with the plan and Code Status.  Code Status Full code  Likely DC to pending PT evaluation  Consults called: None  Admission status: Observation  Time spent in minutes : 60 minutes   Huey Bienenstock M.D on 08/18/2023 at 10:28 PM

## 2023-08-18 NOTE — Progress Notes (Signed)
PHARMACIST - PHYSICIAN ORDER COMMUNICATION ° °CONCERNING: P[employer]T Medication Policy on Herbal Medications ° °DESCRIPTION:  This patient’s order for:  Apoaequorin has been noted. ° °This product(s) is classified as an “herbal” or natural product. °Due to a lack of definitive safety studies or FDA approval, nonstandard manufacturing practices, plus the potential risk of unknown drug-drug interactions while on inpatient medications, the Pharmacy and Therapeutics Committee does not permit the use of “herbal” or natural products of this type within [facility]. °  °ACTION TAKEN: °The pharmacy department is unable to verify this order at this time and your patient has been informed of this safety policy. °Please reevaluate patient’s clinical condition at discharge and address if the herbal or natural product(s) should be resumed at that time. ° °Jannine Abreu, PharmD  °

## 2023-08-18 NOTE — ED Notes (Signed)
Pt incontinent of urine, peri care performed, brief changed, male PW applied as peri area and scrotum appear to be red and raw in appears. Barrier cream applied

## 2023-08-18 NOTE — ED Provider Notes (Signed)
[place] EMERGENCY DEPARTMENT AT [facility] Provider Note   CSN: [id number] Arrival date & time: 08/18/23  1314     History  Chief Complaint  Patient presents with   Extremity Weakness    David Pugh. is a 88 y.o. male with past medical history significant for hypertension, CLL, CAD, peripheral neuropathy, cancer (prostate and cancer) presents to the ED via EMS complaining of 4 falls since Friday.  Patient states he will be walking when his legs suddenly give out on him.  He does not feel dizzy or lightheaded when walking.  Patient reports he does feel off balance before his legs give out.  He utilizes a cane when walking.  Patient has had falls in the past, but none for quite awhile.  Patient has started PT 6 days ago.  He reports he has chronic pain in the right hip that has shifted to his left hip due to PT exercises.  He denies any injury related to his falls.  Patient lives with his wife who is unable to assist him off of the floor.  Patient concerned about the new weakness in his lower extremities.  He does have peripheral neuropathy which he reports he now feels up to his knees, but this has been progressively worsening.  Denies loss of bladder or bowel control, back pain.         Home Medications Prior to Admission medications   Medication Sig Start Date End Date Taking? Authorizing Provider  Apoaequorin (PREVAGEN EXTRA STRENGTH) 20 MG CAPS Take 1 capsule by mouth daily.    [provider]  Azelastine-Fluticasone 137-50 MCG/ACT SUSP Place 1 spray into the nose every 12 (twelve) hours. 07/15/23   Billie Lade, MD  B Complex Vitamins (VITAMIN B COMPLEX PO) Take 1 tablet by mouth at bedtime.     [provider]  cholecalciferol (VITAMIN D3) 25 MCG (1000 UNIT) tablet Take 1,000 Units by mouth 2 (two) times daily.    [provider]  citalopram (CELEXA) 10 MG tablet Take 10 mg by mouth daily.    [provider]   clopidogrel (PLAVIX) 75 MG tablet TAKE 1 TABLET BY MOUTH ONCE DAILY. 01/20/23   Jonelle Sidle, MD  folic acid (FOLVITE) 1 MG tablet TAKE (1) TABLET BY MOUTH ONCE DAILY . Patient taking differently: Take 1 mg by mouth daily. 07/13/17   Hubbard Hartshorn, NP  ibuprofen (ADVIL,MOTRIN) 200 MG tablet Take 400 mg by mouth daily as needed for headache or moderate pain.    [provider]  latanoprost (XALATAN) 0.005 % ophthalmic solution 1 DROP IN [facility] EYE AT BEDTIME. 02/16/22   Rankin, Alford Highland, MD  loperamide (IMODIUM) 2 MG capsule Take 1 capsule (2 mg total) by mouth as needed for diarrhea or loose stools (maximum 16 mg per day). Patient not taking: Reported on 07/15/2023 11/13/20   Linwood Dibbles, MD  losartan (COZAAR) 50 MG tablet Take 25 mg by mouth 2 (two) times daily.     [provider]  memantine (NAMENDA) 10 MG tablet Take 1 tablet (10 mg total) by mouth 2 (two) times daily. 11/25/22 11/20/23  Windell Norfolk, MD  metoprolol (LOPRESSOR) 50 MG tablet Take 50 mg by mouth 2 (two) times daily.    [provider]  montelukast (SINGULAIR) 10 MG tablet Take 10 mg by mouth at bedtime.    [provider]  Multiple Vitamin (MULTIVITAMIN WITH MINERALS) TABS tablet Take 1 tablet by mouth daily.  [provider]  nitroGLYCERIN (NITROSTAT) 0.4 MG SL tablet Place 1 tablet (0.4 mg total) under the tongue every 5 (five) minutes x 3 doses as needed for chest pain (if no relief after 3rd dose, proceed to ED or call 911). Patient not taking: Reported on 07/15/2023 04/04/23   Jonelle Sidle, MD  pantoprazole (PROTONIX) 40 MG tablet TAKE (1) TABLET TWICE DAILY. Patient taking differently: Take 40 mg by mouth daily. 01/04/21   Gelene Mink, NP  polyethylene glycol (MIRALAX / GLYCOLAX) 17 g packet Take 17 g by mouth daily.    [provider]  pravastatin (PRAVACHOL) 40 MG tablet take 1 tablet by mouth at bedtime. 07/04/23   Jonelle Sidle, MD  rivastigmine  (EXELON) 1.5 MG capsule Take 1 capsule (1.5 mg total) by mouth 2 (two) times daily. 11/25/22 11/20/23  Windell Norfolk, MD  vitamin B-12 (CYANOCOBALAMIN) 1000 MCG tablet Take by mouth daily.     [provider]      Allergies    Shellfish allergy and Sulfonamide derivatives    Review of Systems   Review of Systems  Musculoskeletal:  Positive for gait problem. Negative for back pain.  Neurological:  Positive for weakness. Negative for dizziness and light-headedness.    Physical Exam Updated Vital Signs BP (!) 154/66   Pulse 69   Temp 99.5 F (37.5 C) (Oral)   Resp 19   SpO2 98%  Physical Exam Vitals and nursing note reviewed.  Constitutional:      General: He is not in acute distress.    Appearance: Normal appearance. He is not ill-appearing or diaphoretic.  Cardiovascular:     Rate and Rhythm: Normal rate and regular rhythm.  Pulmonary:     Effort: Pulmonary effort is normal.  Musculoskeletal:     Lumbar back: Tenderness (mild L paraspinal tenderness) present. No bony tenderness.     Right hip: Normal.     Left hip: Normal.  Skin:    General: Skin is warm and dry.     Capillary Refill: Capillary refill takes less than 2 seconds.  Neurological:     Mental Status: He is alert. Mental status is at baseline.     Sensory: Sensation is intact.     Motor: Weakness present.     Comments: Motor: 5/5 strength in left lower extremity in major muscles.  5/5 dorsiflexion and plantarflexion bilaterally.  2/5 strength in the right lower extremity.  Patient is unable to lift the right leg off of the bed.  He is able to hold his leg up if lifted.  He reports he does have some pain in the right upper leg and hip when this is done.  Psychiatric:        Mood and Affect: Mood normal.        Behavior: Behavior normal.     ED Results / Procedures / Treatments   Labs (all labs ordered are listed, but only abnormal results are displayed) Labs Reviewed  CBC WITH DIFFERENTIAL/PLATELET  - Abnormal; Notable for the following components:      Result Value   WBC 20.3 (*)    All other components within normal limits  COMPREHENSIVE METABOLIC PANEL - Abnormal; Notable for the following components:   Glucose, Bld 120 (*)    Calcium 8.8 (*)    Total Protein 6.4 (*)    All other components within normal limits  URINALYSIS, W/ REFLEX TO CULTURE (INFECTION SUSPECTED)  PROTIME-INR  APTT    EKG  None  Radiology No results found.  Procedures Procedures    Medications Ordered in ED Medications - No data to display  ED Course/ Medical Decision Making/ A&P                                 Medical Decision Making Amount and/or Complexity of Data Reviewed Labs: ordered. Radiology: ordered.  Risk Decision regarding hospitalization.   This patient presents to the ED with chief complaint(s) of multiple falls, lower extremity weakness with pertinent past medical history of cancer, CLL, hypertension, CAD.  The complaint involves an extensive differential diagnosis and also carries with it a high risk of complications and morbidity.    The differential diagnosis includes CVA, infectious etiology, neuropathy, neurodegenerative disease, peripheral artery disease   The initial plan is to obtain labs, imaging  Initial Assessment:   On exam, patient is resting comfortably in bed and does not appear to be in acute distress.  5/5 strength in left lower extremity in major muscles.  5/5 dorsiflexion and plantarflexion bilaterally.  2/5 strength in the right lower extremity.  Patient is unable to lift the right leg off of the bed.  He is able to hold his leg up if lifted.  He reports he does have some pain in the right upper leg and hip when this is done.  No midline spinal tenderness in the lumbar region.  Does have very mild left-sided paraspinal tenderness.  No deformity or tenderness palpated over the pelvis.  No rotation or shortening of either lower extremity.  DP pulses are 2+  bilaterally.  Sensation is intact.  Independent ECG/labs interpretation:  The following labs were independently interpreted:  CBC with leukocytosis, appears to be chronic for patient and likely related to his CLL.  No anemia. Metabolic panel without major electrolyte disturbance.  Renal and hepatic function are both normal. UA without evidence of infection. APTT, PT/INR unremarkable.  Independent visualization and interpretation of imaging: I independently visualized the following imaging with scope of interpretation limited to determining acute life threatening conditions related to emergency care: hip x-ray, which revealed mild bilateral hip osteoarthritis.   Chest x-ray was ordered due to patient having persistent cough that is worsening.  X-ray revealed minimal linear scarring at both lung bases.  No focal consolidation, pleural effusion, or pneumothorax.   MR brain ordered to investigate right lower extremity weakness.  No acute intracranial abnormality.  Mild chronic small vessel ischemic disease.    Consultations obtained:   Spoke with on-call hospitalist Dr. Randol Kern regarding hospital admission for this patient as he is having increasing falls and difficulty with ambulation.  Dr. Randol Kern agreed to come and evaluate patient.  He recommended a lumbar MRI as patient does have weakness in his right lower extremity and no evidence of stroke on brain MRI.  Disposition:   Patient to be admitted to [facility].          Final Clinical Impression(s) / ED Diagnoses Final diagnoses:  None    Rx / DC Orders ED Discharge Orders     None         Lenard Simmer, PA-C 08/20/23 0005    Vanetta Mulders, MD 08/26/23 1515

## 2023-08-18 NOTE — ED Triage Notes (Signed)
PT in from home via [facility] EMS, c/o bil leg weakness with multiple falls over the last few days, with the last fall reported to be today and EMS went to the home but did not transfer, the family called today for work up on generalized weakness, pt currently taking PT which was started x 6 days ago, pt A&O x4, c/o L hip that is new, no shortening or rotation noted

## 2023-08-19 ENCOUNTER — Other Ambulatory Visit: Payer: Self-pay

## 2023-08-19 DIAGNOSIS — I2510 Atherosclerotic heart disease of native coronary artery without angina pectoris: Secondary | ICD-10-CM | POA: Diagnosis not present

## 2023-08-19 DIAGNOSIS — R531 Weakness: Secondary | ICD-10-CM | POA: Diagnosis not present

## 2023-08-19 DIAGNOSIS — I10 Essential (primary) hypertension: Secondary | ICD-10-CM

## 2023-08-19 DIAGNOSIS — K219 Gastro-esophageal reflux disease without esophagitis: Secondary | ICD-10-CM

## 2023-08-19 DIAGNOSIS — W19XXXD Unspecified fall, subsequent encounter: Secondary | ICD-10-CM | POA: Diagnosis not present

## 2023-08-19 DIAGNOSIS — E785 Hyperlipidemia, unspecified: Secondary | ICD-10-CM

## 2023-08-19 DIAGNOSIS — U071 COVID-19: Secondary | ICD-10-CM | POA: Diagnosis not present

## 2023-08-19 DIAGNOSIS — I2583 Coronary atherosclerosis due to lipid rich plaque: Secondary | ICD-10-CM

## 2023-08-19 LAB — CBC
HCT: 41.4 % (ref 39.0–52.0)
Hemoglobin: 13.5 g/dL (ref 13.0–17.0)
MCH: 31 pg (ref 26.0–34.0)
MCHC: 32.6 g/dL (ref 30.0–36.0)
MCV: 95 fL (ref 80.0–100.0)
Platelets: 252 10*3/uL (ref 150–400)
RBC: 4.36 MIL/uL (ref 4.22–5.81)
RDW: 12.7 % (ref 11.5–15.5)
WBC: 16 10*3/uL — ABNORMAL HIGH (ref 4.0–10.5)
nRBC: 0 % (ref 0.0–0.2)

## 2023-08-19 LAB — BASIC METABOLIC PANEL
Anion gap: 9 (ref 5–15)
BUN: 15 mg/dL (ref 8–23)
CO2: 24 mmol/L (ref 22–32)
Calcium: 8.6 mg/dL — ABNORMAL LOW (ref 8.9–10.3)
Chloride: 104 mmol/L (ref 98–111)
Creatinine, Ser: 1.1 mg/dL (ref 0.61–1.24)
GFR, Estimated: >60 mL/min (ref >60)
Glucose, Bld: 101 mg/dL — ABNORMAL HIGH (ref 70–99)
Potassium: 3.6 mmol/L (ref 3.5–5.1)
Sodium: 137 mmol/L (ref 135–145)

## 2023-08-19 LAB — SARS CORONAVIRUS 2 BY RT PCR: SARS Coronavirus 2 by RT PCR: POSITIVE — AB

## 2023-08-19 MED ORDER — FOLIC ACID 1 MG PO TABS
1.0000 mg | ORAL_TABLET | Freq: Every day | ORAL | Status: DC
  Administered 2023-08-19 – 2023-08-20 (×2): 1 mg via ORAL
  Filled 2023-08-19 (×2): qty 1

## 2023-08-19 MED ORDER — ADULT MULTIVITAMIN W/MINERALS CH
1.0000 | ORAL_TABLET | Freq: Every day | ORAL | Status: DC
  Administered 2023-08-19 – 2023-08-20 (×2): 1 via ORAL
  Filled 2023-08-19 (×2): qty 1

## 2023-08-19 MED ORDER — IPRATROPIUM-ALBUTEROL 20-100 MCG/ACT IN AERS
2.0000 | INHALATION_SPRAY | Freq: Three times a day (TID) | RESPIRATORY_TRACT | Status: DC
  Administered 2023-08-19: 2 via RESPIRATORY_TRACT
  Filled 2023-08-19: qty 4

## 2023-08-19 MED ORDER — NIRMATRELVIR/RITONAVIR (PAXLOVID)TABLET: 3.0000 | ORAL_TABLET | Freq: Two times a day (BID) | ORAL | Status: DC

## 2023-08-19 MED ORDER — NIRMATRELVIR/RITONAVIR (PAXLOVID) TABLET (RENAL DOSING)
2.0000 | ORAL_TABLET | Freq: Two times a day (BID) | ORAL | Status: DC
  Administered 2023-08-19 – 2023-08-20 (×2): 2 via ORAL
  Filled 2023-08-19: qty 20

## 2023-08-19 MED ORDER — THIAMINE MONONITRATE 100 MG PO TABS
100.0000 mg | ORAL_TABLET | Freq: Every day | ORAL | Status: DC
  Administered 2023-08-19 – 2023-08-20 (×2): 100 mg via ORAL
  Filled 2023-08-19 (×2): qty 1

## 2023-08-19 MED ORDER — IPRATROPIUM-ALBUTEROL 20-100 MCG/ACT IN AERS
2.0000 | INHALATION_SPRAY | Freq: Three times a day (TID) | RESPIRATORY_TRACT | Status: DC
  Administered 2023-08-20: 2 via RESPIRATORY_TRACT

## 2023-08-19 NOTE — Plan of Care (Signed)
  Problem: Activity: Goal: Risk for activity intolerance will decrease Outcome: Progressing   Problem: Coping: Goal: Level of anxiety will decrease Outcome: Progressing   Problem: Safety: Goal: Ability to remain free from injury will improve Outcome: Progressing   

## 2023-08-19 NOTE — Care Management Obs Status (Signed)
MEDICARE OBSERVATION STATUS NOTIFICATION   Patient Details  Name: David Pugh. MRN: [id number] Date of Birth: 02-05-1935   Medicare Observation Status Notification Given:   Yes    [name] 08/19/2023, 2:28 PM

## 2023-08-19 NOTE — ED Notes (Signed)
PT working with pt at this time

## 2023-08-19 NOTE — Progress Notes (Signed)
PHARMACY NOTE:  ANTIMICROBIAL RENAL DOSAGE ADJUSTMENT  Current antimicrobial regimen includes a mismatch between antimicrobial dosage and estimated renal function.  As per policy approved by the Pharmacy & Therapeutics and Medical Executive Committees, the antimicrobial dosage will be adjusted accordingly.  Current antimicrobial dosage:  Paxlovid 300mg /100mg  bid x 5 days  Indication: Covid  Renal Function:  Estimated Creatinine Clearance: 51.3 mL/min (by C-G formula based on SCr of 1.1 mg/dL). []      On intermittent HD, scheduled: []      On CRRT    Antimicrobial dosage has been changed to:  Paxlovid 150mg /100mg   Additional comments:   Thank you for allowing pharmacy to be a part of this patient's care.  Foye Deer, [facility] 08/19/2023 7:10 PM

## 2023-08-19 NOTE — Plan of Care (Signed)
  Problem: Acute Rehab PT Goals(only PT should resolve) Goal: Pt Will Go Supine/Side To Sit Outcome: Progressing Flowsheets (Taken 08/19/2023 1401) Pt will go Supine/Side to Sit:  with supervision  with modified independence Goal: Patient Will Transfer Sit To/From Stand Outcome: Progressing Flowsheets (Taken 08/19/2023 1401) Patient will transfer sit to/from stand:  with modified independence  with supervision Goal: Pt Will Transfer Bed To Chair/Chair To Bed Outcome: Progressing Flowsheets (Taken 08/19/2023 1401) Pt will Transfer Bed to Chair/Chair to Bed:  with modified independence  with supervision Goal: Pt Will Ambulate Outcome: Progressing Flowsheets (Taken 08/19/2023 1401) Pt will Ambulate:  > 125 feet  with modified independence  with supervision  with rolling walker   2:01 PM, 08/19/23 Ocie Bob, MPT Physical Therapist with [facility] [phone] office [phone] mobile phone

## 2023-08-19 NOTE — Plan of Care (Signed)

## 2023-08-19 NOTE — ED Notes (Signed)
Pharmacy notified of missing scheduled doses.

## 2023-08-19 NOTE — Evaluation (Signed)
Physical Therapy Evaluation Patient Details Name: David Pugh. MRN: [id number] DOB: [birth year] Today's Date: 08/19/2023  History of Present Illness  David Pugh  is a 88 y.o. male, with past medical history significant for hypertension, CLL, CAD, peripheral neuropathy, bilateral foot drop, T8 cancer, who presents to ED with complaints of multiple falls, reports he has started physical therapy last week, he had multiple sessions, he was given a few exercises to do he thinks he has been doing them excessively, and reports multiple falls, reports he walks when he feels his legs suddenly give out on him, no dizziness, no lightheadedness, no focal deficits, for his feeling of balance before falling out, no chest pain, no shortness of breath, no head trauma, he reports chronic pain in the right hip, with known history of peripheral neuropathy, bilateral foot drop, he denies any bladder or bowel continence or lose control.  -In ED MRI brain obtained, with no evidence of acute CVA, white blood cell is elevated at 20.3 K, which is at baseline for his known CLL, UA was negative, given multiple falls [facility] hospitalist consulted to admit.   Clinical Impression  Patient demonstrates labored movement for sitting up at bedside, had to use armrest of chair when transferring to chair, safer using RW with good return demonstrated for ambulating in room/hallway without loss of balance.  Patient tolerated sitting up in chair after therapy.  Patient will benefit from continued skilled physical therapy in hospital and recommended venue below to increase strength, balance, endurance for safe ADLs and gait.           If plan is discharge home, recommend the following: A little help with walking and/or transfers;A little help with bathing/dressing/bathroom;Help with stairs or ramp for entrance;Assistance with cooking/housework   Can travel by private vehicle        Equipment Recommendations None  recommended by PT  Recommendations for Other Services       Functional Status Assessment Patient has had a recent decline in their functional status and demonstrates the ability to make significant improvements in function in a reasonable and predictable amount of time.     Precautions / Restrictions Precautions Precautions: Fall Restrictions Weight Bearing Restrictions Per Provider Order: No      Mobility  Bed Mobility Overal bed mobility: Needs Assistance Bed Mobility: Supine to Sit     Supine to sit: Supervision     General bed mobility comments: slightly labored movement    Transfers Overall transfer level: Needs assistance Equipment used: Rolling walker (2 wheels) Transfers: Bed to chair/wheelchair/BSC, Sit to/from Stand Sit to Stand: Contact guard assist   Step pivot transfers: Contact guard assist       General transfer comment: had to use RW for safety due to BLE weakness    Ambulation/Gait Ambulation/Gait assistance: Contact guard assist Gait Distance (Feet): 80 Feet Assistive device: Rolling walker (2 wheels) Gait Pattern/deviations: Decreased weight shift to right, Decreased weight shift to left, Knee flexed in stance - left, Decreased stride length, Trunk flexed Gait velocity: decreased     General Gait Details: slightly labored movement without loss of balance using RW, limited mostly due to fatigue  Stairs            Wheelchair Mobility     Tilt Bed    Modified Rankin (Stroke Patients Only)       Balance Overall balance assessment: Needs assistance Sitting-balance support: Feet supported, No upper extremity supported Sitting balance-Leahy Scale: Fair Sitting balance - Comments:  fair/good seated at EOB   Standing balance support: Bilateral upper extremity supported, During functional activity, Reliant on assistive device for balance Standing balance-Leahy Scale: Fair Standing balance comment: fair/good using RW                              Pertinent Vitals/Pain Pain Assessment Pain Assessment: No/denies pain    Home Living Family/patient expects to be discharged to:: Private residence Living Arrangements: Spouse/significant other Available Help at Discharge: Family Type of Home: House Home Access: Stairs to enter Entrance Stairs-Rails: None Entrance Stairs-Number of Steps: 2   Home Layout: Two level;Other (Comment) Home Equipment: Rolling Walker (2 wheels);Cane - quad;Cane - single point;Grab bars - tub/shower      Prior Function Prior Level of Function : Needs assist;History of Falls (last six months)       Physical Assist : ADLs (physical);Mobility (physical) Mobility (physical): Bed mobility;Transfers;Gait;Stairs ADLs (physical): IADLs Mobility Comments: Community ambulator with [facility] ADLs Comments: Independent ADL; able to cook; assist for other IADL's; does not drive.     Extremity/Trunk Assessment   Upper Extremity Assessment Upper Extremity Assessment: Defer to OT evaluation    Lower Extremity Assessment Lower Extremity Assessment: Generalized weakness    Cervical / Trunk Assessment Cervical / Trunk Assessment: Kyphotic  Communication   Communication Communication: No apparent difficulties  Cognition Arousal: Alert Behavior During Therapy: WFL for tasks assessed/performed Overall Cognitive Status: Within Functional Limits for tasks assessed                                          General Comments      Exercises     Assessment/Plan    PT Assessment Patient needs continued PT services  PT Problem List Decreased strength;Decreased activity tolerance;Decreased balance;Decreased mobility       PT Treatment Interventions DME instruction;Gait training;Stair training;Functional mobility training;Therapeutic activities;Therapeutic exercise;Patient/family education    PT Goals (Current goals can be found in the Care Plan section)  Acute Rehab PT  Goals Patient Stated Goal: return home with family to assist PT Goal Formulation: With patient Time For Goal Achievement: 08/29/23 Potential to Achieve Goals: Good    Frequency Min 3X/week     Co-evaluation PT/OT/SLP Co-Evaluation/Treatment: Yes Reason for Co-Treatment: To address functional/ADL transfers PT goals addressed during session: Mobility/safety with mobility;Balance;Proper use of DME         AM-PAC PT "6 Clicks" Mobility  Outcome Measure Help needed turning from your back to your side while in a flat bed without using bedrails?: A Little Help needed moving from lying on your back to sitting on the side of a flat bed without using bedrails?: A Little Help needed moving to and from a bed to a chair (including a wheelchair)?: A Little Help needed standing up from a chair using your arms (e.g., wheelchair or bedside chair)?: A Little Help needed to walk in hospital room?: A Little Help needed climbing 3-5 steps with a railing? : A Lot 6 Click Score: 17    End of Session Equipment Utilized During Treatment: Gait belt Activity Tolerance: Patient tolerated treatment well;Patient limited by fatigue Patient left: in chair;with call bell/phone within reach Nurse Communication: Mobility status PT Visit Diagnosis: Unsteadiness on feet (R26.81);Other abnormalities of gait and mobility (R26.89);Muscle weakness (generalized) (M62.81)    Time: 1610-9604 PT Time Calculation (min) (ACUTE ONLY):  22 min   Charges:   PT Evaluation $PT Eval Moderate Complexity: 1 Mod PT Treatments $Therapeutic Activity: 8-22 mins PT General Charges $$ ACUTE PT VISIT: 1 Visit         2:00 PM, 08/19/23 Ocie Bob, MPT Physical Therapist with [facility] [phone] office [phone] mobile phone

## 2023-08-19 NOTE — Evaluation (Signed)
Occupational Therapy Evaluation Patient Details Name: David Pugh. MRN: [id number] DOB: Jun 11, 1935 Today's Date: 08/19/2023   History of Present Illness David Pugh  is a 88 y.o. male, with past medical history significant for hypertension, CLL, CAD, peripheral neuropathy, bilateral foot drop, T8 cancer, who presents to ED with complaints of multiple falls, reports he has started physical therapy last week, he had multiple sessions, he was given a few exercises to do he thinks he has been doing them excessively, and reports multiple falls, reports he walks when he feels his legs suddenly give out on him, no dizziness, no lightheadedness, no focal deficits, for his feeling of balance before falling out, no chest pain, no shortness of breath, no head trauma, he reports chronic pain in the right hip, with known history of peripheral neuropathy, bilateral foot drop, he denies any bladder or bowel continence or lose control.  -In ED MRI brain obtained, with no evidence of acute CVA, white blood cell is elevated at 20.3 K, which is at baseline for his known CLL, UA was negative, given multiple falls [facility] hospitalist consulted to admit. (per MD)   Clinical Impression   Pt agreeable to OT and PT co-evaluation. Pt required mod A progressing to CGA for transfers. Pt was able to complete lower body dressing without physical assist. RW was needed for balance. Pt is generally weak in B UE. Pt left in the chair with call bell within reach. Pt will benefit from continued OT in the hospital and recommended venue below to increase strength, balance, and endurance for safe ADL's.          If plan is discharge home, recommend the following: A little help with walking and/or transfers;A little help with bathing/dressing/bathroom    Functional Status Assessment  Patient has had a recent decline in their functional status and demonstrates the ability to make significant improvements in function in a  reasonable and predictable amount of time.  Equipment Recommendations  None recommended by OT    Recommendations for Other Services       Precautions / Restrictions Precautions Precautions: Fall Restrictions Weight Bearing Restrictions Per Provider Order: No      Mobility Bed Mobility Overal bed mobility: Needs Assistance Bed Mobility: Supine to Sit     Supine to sit: Supervision          Transfers Overall transfer level: Needs assistance Equipment used: Rolling walker (2 wheels) Transfers: Bed to chair/wheelchair/BSC, Sit to/from Stand Sit to Stand: Contact guard assist     Step pivot transfers: Contact guard assist     General transfer comment: Progressed from more mod A to CGA after more reps of ambulation. Needing RW for balance today.      Balance Overall balance assessment: Needs assistance Sitting-balance support: No upper extremity supported, Feet unsupported Sitting balance-Leahy Scale: Fair Sitting balance - Comments: fair to good seated at EOB   Standing balance support: Bilateral upper extremity supported, During functional activity, Reliant on assistive device for balance Standing balance-Leahy Scale: Fair Standing balance comment: fair using RW; poor without RW                           ADL either performed or assessed with clinical judgement   ADL Overall ADL's : Needs assistance/impaired     Grooming: Standing;Contact guard assist   Upper Body Bathing: Set up   Lower Body Bathing: Set up;Modified independent       Lower  Body Dressing: Modified independent;Set up   Toilet Transfer: Contact guard assist;Ambulation;Rolling walker (2 wheels) Toilet Transfer Details (indicate cue type and reason): Simulated via EOB to chair and ambulation in the hall. Toileting- Clothing Manipulation and Hygiene: Modified independent;Set up;Sitting/lateral lean       Functional mobility during ADLs: Contact guard assist;Rolling walker (2  wheels)       Vision Baseline Vision/History: 1 Wears glasses Ability to See in Adequate Light: 1 Impaired Patient Visual Report: Other (comment) (Pt saw double yesterday but his vision is normal now.) Vision Assessment?: No apparent visual deficits     Perception Perception: Not tested       Praxis Praxis: Not tested       Pertinent Vitals/Pain Pain Assessment Pain Assessment: No/denies pain     Extremity/Trunk Assessment Upper Extremity Assessment Upper Extremity Assessment: Generalized weakness   Lower Extremity Assessment Lower Extremity Assessment: Defer to PT evaluation   Cervical / Trunk Assessment Cervical / Trunk Assessment: Kyphotic   Communication Communication Communication: No apparent difficulties   Cognition Arousal: Alert Behavior During Therapy: WFL for tasks assessed/performed Overall Cognitive Status: Within Functional Limits for tasks assessed                                                        Home Living Family/patient expects to be discharged to:: Private residence Living Arrangements: Spouse/significant other Available Help at Discharge: Family (wife available often) Type of Home: House Home Access: Stairs to enter [employer] of Steps: 2 Entrance Stairs-Rails: None Home Layout: Two level;Other (Comment) (has stair lift)     Bathroom Shower/Tub: Walk-in shower;Tub/shower unit   Bathroom Toilet: Standard Bathroom Accessibility: Yes How Accessible: Accessible via walker Home Equipment: Rolling Walker (2 wheels);Cane - quad;Cane - single point;Grab bars - tub/shower          Prior Functioning/Environment Prior Level of Function : Needs assist;History of Falls (last six months)       Physical Assist : ADLs (physical)   ADLs (physical): IADLs Mobility Comments: Community ambulator with [facility] ADLs Comments: Independent ADL; able to cook; assist for other IADL's; does not drive.        OT  Problem List: Decreased strength;Decreased activity tolerance;Impaired balance (sitting and/or standing)      OT Treatment/Interventions: Self-care/ADL training;Therapeutic exercise;Therapeutic activities;Patient/family education    OT Goals(Current goals can be found in the care plan section) Acute Rehab OT Goals Patient Stated Goal: return home OT Goal Formulation: With patient Time For Goal Achievement: 09/02/23 Potential to Achieve Goals: Good  OT Frequency: Min 1X/week    Co-evaluation PT/OT/SLP Co-Evaluation/Treatment: Yes Reason for Co-Treatment: To address functional/ADL transfers   OT goals addressed during session: ADL's and self-care      AM-PAC OT "6 Clicks" Daily Activity     Outcome Measure Help from another person eating meals?: None Help from another person taking care of personal grooming?: A Little Help from another person toileting, which includes using toliet, bedpan, or urinal?: A Little Help from another person bathing (including washing, rinsing, drying)?: A Little Help from another person to put on and taking off regular upper body clothing?: A Little Help from another person to put on and taking off regular lower body clothing?: A Little 6 Click Score: 19   End of Session Equipment Utilized During Treatment: Rolling walker (  2 wheels)  Activity Tolerance: Patient tolerated treatment well Patient left: with call bell/phone within reach;in chair  OT Visit Diagnosis: Unsteadiness on feet (R26.81);Other abnormalities of gait and mobility (R26.89);Muscle weakness (generalized) (M62.81)                Time: 1610-9604 OT Time Calculation (min): 21 min Charges:  OT General Charges $OT Visit: 1 Visit OT Evaluation $OT Eval Low Complexity: 1 Low  Shivansh Hardaway OT, MOT   Danie Chandler 08/19/2023, 10:11 AM

## 2023-08-19 NOTE — TOC Initial Note (Signed)
Transition of Care (TOC) - Initial/Assessment Note    Patient Details  Name: David Pugh. MRN: [id number] Date of Birth: 07/19/1935  Transition of Care [facility]) CM/SW Contact:    Leitha Bleak, RN Phone Number: 08/19/2023, 1:39 PM  Clinical Narrative:        Patient admitted after a fall. PT is recommended HHPT. CM spoke with patient, he lives a home with his wife and is agreeable to home health. CMS choices reviewed, no preference given, Referral given to [facility] with Frances Furbish, MD aware to order.            Expected Discharge Plan: Home w Home Health Services Barriers to Discharge: Continued Medical Work up   Patient Goals and CMS Choice   CMS Medicare.gov Compare Post Acute Care list provided to:: Patient    Expected Discharge Plan and Services      Living arrangements for the past 2 months: Single Family Home                      HH Arranged: PT HH Agency: [facility] Date [facility] Agency Contacted: 08/19/23 Time HH Agency Contacted: [phone] Representative spoke with at [facility] Agency: Kandee Keen  Prior Living Arrangements/Services Living arrangements for the past 2 months: Single Family Home Lives with:: Spouse Patient language and need for interpreter reviewed:: Yes        Need for Family Participation in Patient Care: Yes (Comment) Care giver support system in place?: Yes (comment)   Criminal Activity/Legal Involvement Pertinent to Current Situation/Hospitalization: No - Comment as needed  Activities of Daily Living   ADL Screening (condition at time of admission) Independently performs ADLs?: Yes (appropriate for developmental age) Is the patient deaf or have difficulty hearing?: No Does the patient have difficulty seeing, even when wearing glasses/contacts?: No Does the patient have difficulty concentrating, remembering, or making decisions?: No  Permission Sought/Granted      Emotional Assessment     Affect (typically observed): Accepting Orientation:  : Oriented to Self, Oriented to Place, Oriented to  Time, Oriented to Situation Alcohol / Substance Use: Not Applicable Psych Involvement: No (comment)  Admission diagnosis:  Gait disturbance [R26.9] Fall [W19.XXXA] Weakness of right leg [R29.898] Patient Active Problem List   Diagnosis Date Noted   Fall 08/18/2023   PTSD (post-traumatic stress disorder) 07/15/2023   Squamous cell carcinoma of right ear 07/15/2023   Memory impairment 07/15/2023   Seasonal allergic rhinitis 07/15/2023   Chronic pain of right knee 07/15/2023   Rectal pain 01/10/2023   Rectal bleeding 01/10/2023   Central retinal vein occlusion of left eye 01/21/2020   Early stage nonexudative age-related macular degeneration of right eye 01/21/2020   Right epiretinal membrane 01/21/2020   Cystoid macular edema of left eye 01/21/2020   Advanced nonexudative age-related macular degeneration of left eye with subfoveal involvement 01/21/2020   Primary open angle glaucoma of both eyes, mild stage 01/21/2020   Bilateral foot-drop 04/02/2019   Decreased appetite 08/18/2017   Loss of weight 08/18/2017   Dark stools 08/18/2017   Morbid obesity (HCC) 05/02/2017   Vitamin D deficiency 11/26/2015   CLL (chronic lymphocytic leukemia) (HCC) 07/21/2015   Mucosal abnormality of esophagus    Mucosal abnormality of stomach    Multiple gastric polyps    History of colon cancer    Diverticulosis of colon without hemorrhage    Family history of prostate cancer    Family history of colon cancer    Family history  of stomach cancer    Benign prostatic hyperplasia with urinary obstruction 09/12/2014   H/O prostate cancer 08/20/2014   Radiation proctitis 08/19/2014   H/O agent Orange exposure 07/01/2014   Unstable angina (HCC) 06/16/2014   Shingles outbreak, right anterior thoracic 01/28/2014   Gait abnormality 12/18/2013   Viral gastroenteritis 11/26/2013   Dyslipidemia 02/12/2013   Peripheral neuropathy 10/14/2012   CAD  (coronary artery disease) 10/14/2012   HTN (hypertension) 10/14/2012   Hx of adenomatous colonic polyps 07/26/2012   Folic acid deficiency 07/26/2012   Iron deficiency 05/02/2012   Cancer of ascending colon (HCC) 03/29/2012   GERD 03/09/2010   PREMATURE VENTRICULAR CONTRACTIONS 04/30/2009   PCP:  Billie Lade, MD Pharmacy:   [facility] - [place], [place] - [address] [place] [address] Phone: [phone] Fax: [phone]    Social Drivers of Health (SDOH) Social History: SDOH Screenings   Food Insecurity: No Food Insecurity (08/18/2023)  Housing: Unknown (08/19/2023)  Transportation Needs: No Transportation Needs (08/18/2023)  Utilities: Not At Risk (08/18/2023)  Alcohol Screen: Low Risk  (07/14/2020)  Depression (PHQ2-9): Low Risk  (07/15/2023)  Financial Resource Strain: Low Risk  (12/15/2022)   Received from [facility], [facility]  Physical Activity: Inactive (07/14/2020)  Social Connections: Unknown (01/11/2022)   Received from [facility], [facility]  Stress: No Stress Concern Present (12/15/2022)   Received from [facility], [facility]  Tobacco Use: Low Risk  (08/18/2023)   SDOH Interventions:    Readmission Risk Interventions     No data to display

## 2023-08-19 NOTE — Plan of Care (Signed)
  Problem: Acute Rehab OT Goals (only OT should resolve) Goal: Pt. Will Perform Grooming Flowsheets (Taken 08/19/2023 1012) Pt Will Perform Grooming:  with modified independence  standing Goal: Pt. Will Transfer To Toilet Flowsheets (Taken 08/19/2023 1012) Pt Will Transfer to Toilet: with modified independence Goal: Pt. Will Perform Toileting-Clothing Manipulation Flowsheets (Taken 08/19/2023 1012) Pt Will Perform Toileting - Clothing Manipulation and hygiene: with modified independence Goal: Pt/Caregiver Will Perform Home Exercise Program Flowsheets (Taken 08/19/2023 1012) Pt/caregiver will Perform Home Exercise Program:  Increased strength  Both right and left upper extremity  Independently  Tonianne Fine OT, MOT

## 2023-08-19 NOTE — Progress Notes (Signed)
PROGRESS NOTE    David Pugh.  WGN:[id number] DOB: 1935/04/30 DOA: 08/18/2023 PCP: Billie Lade, MD    Chief Complaint  Patient presents with   Extremity Weakness    Brief Narrative:  Patient 88 year old gentleman history of hypertension, CLL, CAD, peripheral neuropathy, bilateral foot drop, presented to the ED with complaints of multiple falls.  Patient noted that when he walks his legs suddenly gave out on him denied any dizziness or lightheadedness and no focal deficits.  Patient seen in the ED MRI brain done negative for any acute CVA.  Patient noted to have a leukocytosis which was at his baseline for known CLL.  Urinalysis negative for UTI.  Due to multiple falls patient admitted for further evaluation.  PT consulted.  Once patient had reached the floor was notified by RN that patient's wife just noted to be positive for COVID-19 and as such patient was tested for COVID-19 which came back positive.   Assessment & Plan:   Principal Problem:   Fall Active Problems:   CAD (coronary artery disease)   Benign prostatic hyperplasia with urinary obstruction   Bilateral foot-drop   COVID-19 virus infection   Hyperlipidemia  #1 multiple falls/unsteady gait/deconditioning -Likely secondary to COVID infection. -Patient noted with multiple falls, patient with significant right hip pain with known history of peripheral neuropathy, bilateral feet drop and osteoarthritis. -MRI brain done negative for acute CVA.  - MRI L-spine done with right lateral recess narrowing and mild right neuroforaminal stenosis at L4-5 due to asymmetric disc bulge and moderate facet arthrosis.  Moderate L5-S1 facet arthrosis.  No central spinal canal stenosis. -Patient states findings on MRI L-spine chronic patient denies any bowel or urinary incontinence, denies any significant pain or weakness at time of assessment in his lower extremities. -Will need outpatient follow-up for MRI  findings. -Supportive care. -Placed empirically on Paxlovid due to COVID infection.  2.  COVID infection -Was notified by RN after patient had reached the floor from the ED that patient's wife had just been diagnosed with COVID-19. -COVID-19 PCR obtained which was positive. -Could likely be etiology of patient's multiple falls. -Check a CRP. -Patient currently not hypoxic and as such we will hold off on steroids. -Due to patient's advanced age, comorbidities of CLL, we will treat with Paxlovid. -Place on Combivent. -Supportive care.  3.  CAD status post PCI 2012, 2015 -Continue home regimen Plavix, Cozaar, Lopressor, Pravachol.  4.  Hypertension -Controlled on current regimen of Cozaar, Lopressor.  5.  Hyperlipidemia -Statin.  6.  GERD -PPI.  7.  History of CLL -Outpatient follow-up with primary oncologist.    DVT prophylaxis: Heparin Code Status: Full Family Communication: Updated patient.  No family at bedside. Disposition: TBD  Status is: Observation The patient remains OBS appropriate and will d/c before 2 midnights.   Consultants:  None  Procedures:  Chest x-ray 08/18/2023 MRI brain 08/18/2023 MRI L-spine 08/18/2023  Antimicrobials:  Anti-infectives (From admission, onward)    Start     Dose/Rate Route Frequency Ordered Stop   08/19/23 2200  nirmatrelvir/ritonavir (PAXLOVID) 3 tablet  Status:  Discontinued        3 tablet Oral 2 times daily 08/19/23 1859 08/19/23 1908   08/19/23 2200  nirmatrelvir/ritonavir (renal dosing) (PAXLOVID) 2 tablet        2 tablet Oral 2 times daily 08/19/23 1909 08/24/23 2159         Subjective: Patient sitting up on gurney in ED.  States he feels much  better than he did when he presented to the ED.  Patient stated able to stand up today without legs giving out and feeling very weak which she could not do on presentation.  Denies any chest pain or shortness of breath.  No back pain.  No bowel or urinary  incontinence.  Objective: Vitals:   08/19/23 1015 08/19/23 1030 08/19/23 1141 08/19/23 1627  BP: (!) 109/91 131/66 (!) 148/88 (!) 132/58  Pulse: 70 65 63 (!) 55  Resp: 16 (!) 21 16 17   Temp:  98 F (36.7 C) 98.4 F (36.9 C) 97.9 F (36.6 C)  TempSrc:  Oral Oral   SpO2: 97% 97% 98% 100%  Weight:   87 kg   Height:   5\' 11"  (1.803 m)     Intake/Output Summary (Last 24 hours) at 08/19/2023 1928 Last data filed at 08/19/2023 1444 Gross per 24 hour  Intake 240 ml  Output --  Net 240 ml   Filed Weights   08/19/23 1141  Weight: 87 kg    Examination:  General exam: Appears calm and comfortable  Respiratory system: Clear to auscultation.  No wheezes, no crackles, no rhonchi.  Fair air movement.  Speaking in full sentences.  Respiratory effort normal. Cardiovascular system: S1 & S2 heard, RRR. No JVD, murmurs, rubs, gallops or clicks. No pedal edema. Gastrointestinal system: Abdomen is nondistended, soft and nontender. No organomegaly or masses felt. Normal bowel sounds heard. Central nervous system: Alert and oriented. No focal neurological deficits. Extremities: Symmetric 5 x 5 power. Skin: No rashes, lesions or ulcers Psychiatry: Judgement and insight appear normal. Mood & affect appropriate.     Data Reviewed: I have personally reviewed following labs and imaging studies  CBC: Recent Labs  Lab 08/18/23 1450 08/19/23 0310  WBC 20.3* 16.0*  NEUTROABS 15.6*  --   HGB 13.7 13.5  HCT 40.7 41.4  MCV 94.4 95.0  PLT 277 252    Basic Metabolic Panel: Recent Labs  Lab 08/18/23 1450 08/19/23 0310  NA 136 137  K 3.8 3.6  CL 104 104  CO2 22 24  GLUCOSE 120* 101*  BUN 16 15  CREATININE 1.14 1.10  CALCIUM 8.8* 8.6*    GFR: Estimated Creatinine Clearance: 51.3 mL/min (by C-G formula based on SCr of 1.1 mg/dL).  Liver Function Tests: Recent Labs  Lab 08/18/23 1450  AST 30  ALT 27  ALKPHOS 58  BILITOT 1.0  PROT 6.4*  ALBUMIN 3.6    CBG: No results for  input(s): "GLUCAP" in the last 168 hours.   Recent Results (from the past 240 hours)  SARS Coronavirus 2 by RT PCR (hospital order, performed in [facility] hospital lab) *cepheid single result test* Anterior Nasal Swab     Status: Abnormal   Collection Time: 08/19/23  5:49 PM   Specimen: Anterior Nasal Swab  Result Value Ref Range Status   SARS Coronavirus 2 by RT PCR POSITIVE (A) NEGATIVE Final    Comment: (NOTE) SARS-CoV-2 target nucleic acids are DETECTED  SARS-CoV-2 RNA is generally detectable in upper respiratory specimens  during the acute phase of infection.  Positive results are indicative  of the presence of the identified virus, but do not rule out bacterial infection or co-infection with other pathogens not detected by the test.  Clinical correlation with patient history and  other diagnostic information is necessary to determine patient infection status.  The expected result is negative.  Fact Sheet for Patients:   RoadLapTop.co.za   Fact Sheet  for Healthcare Providers:   http://kim-miller.com/    This test is not yet approved or cleared by the [place] and  has been authorized for detection and/or diagnosis of SARS-CoV-2 by FDA under an Emergency Use Authorization (EUA).  This EUA will remain in effect (meaning this test can be used) for the duration of  the COVID-19 declaration under Section 564(b)(1)  of the Act, 21 U.S.C. section 360-bbb-3(b)(1), unless the authorization is terminated or revoked sooner.   Performed at [facility], [address] [place], [place] [address]          Radiology Studies: MR LUMBAR SPINE WO CONTRAST Result Date: 08/18/2023 CLINICAL DATA:  Lumbar radiculopathy. Right lower extremity weakness EXAM: MRI LUMBAR SPINE WITHOUT CONTRAST TECHNIQUE: Multiplanar, multisequence MR imaging of the lumbar spine was performed. No intravenous contrast was administered. COMPARISON:  None  Available. FINDINGS: Segmentation:  Standard Alignment:  Grade 1 retrolisthesis at L2-3 Vertebrae: Low T1/high T2 signal marrow lesion at L3 is favored to be a lipid poor hemangioma. No acute fracture. No discitis-osteomyelitis. Conus medullaris and cauda equina: Conus extends to the L1 level. Conus and cauda equina appear normal. Paraspinal and other soft tissues: Negative Disc levels: L1-L2: Normal disc space and facet joints. No spinal canal stenosis. No neural foraminal stenosis. L2-L3: Small disc bulge. No spinal canal stenosis. No neural foraminal stenosis. L3-L4: Small disc bulge and mild facet hypertrophy. No spinal canal stenosis. No neural foraminal stenosis. L4-L5: Disc space narrowing with intermediate sized right asymmetric bulge and moderate facet arthrosis. Right lateral recess narrowing without central spinal canal stenosis. Mild right neural foraminal stenosis. L5-S1: Moderate facet arthrosis. No disc herniation. No spinal canal stenosis. No neural foraminal stenosis. Visualized sacrum: Normal. IMPRESSION: 1. Right lateral recess narrowing and mild right neural foraminal stenosis at L4-L5 due to asymmetric disc bulge and moderate facet arthrosis. 2. Moderate L5-S1 facet arthrosis. 3. No central spinal canal stenosis. Electronically Signed   By: Deatra Robinson M.D.   On: 08/18/2023 23:32   DG Chest Portable 1 View Result Date: 08/18/2023 CLINICAL DATA:  Worsening productive cough. EXAM: PORTABLE CHEST 1 VIEW COMPARISON:  Chest x-ray dated May 01, 2021. FINDINGS: Stable cardiomediastinal silhouette with normal heart size. Normal pulmonary vascularity. Minimal linear scarring at both lung bases again noted. No focal consolidation, pleural effusion, or pneumothorax. No acute osseous abnormality. IMPRESSION: No active disease. Electronically Signed   By: Obie Dredge M.D.   On: 08/18/2023 18:25   DG HIP UNILAT WITH PELVIS 2-3 VIEWS RIGHT Result Date: 08/18/2023 CLINICAL DATA:  Right hip  pain and weakness.  Recent falls. EXAM: DG HIP (WITH OR WITHOUT PELVIS) 2-3V RIGHT COMPARISON:  CT abdomen pelvis dated May 01, 2021. FINDINGS: No acute fracture or dislocation. Mild bilateral hip joint space narrowing with small marginal osteophytes. Soft tissues are unremarkable. IMPRESSION: 1. Mild bilateral hip osteoarthritis. Electronically Signed   By: Obie Dredge M.D.   On: 08/18/2023 18:23   MR BRAIN WO CONTRAST Result Date: 08/18/2023 CLINICAL DATA:  Neuro deficit, acute, stroke suspected. Right lower extremity weakness. EXAM: MRI HEAD WITHOUT CONTRAST TECHNIQUE: Multiplanar, multiecho pulse sequences of the brain and surrounding structures were obtained without intravenous contrast. COMPARISON:  Head CT 07/01/2016 FINDINGS: Brain: There is no evidence of an acute infarct, intracranial hemorrhage, mass, midline shift, or extra-axial fluid collection. Mild generalized cerebral atrophy is within normal for age. Small T2 hyperintensities in the cerebral white matter nonspecific but compatible with mild chronic small vessel ischemic disease. Vascular: Major  intracranial vascular flow voids are preserved. Skull and upper cervical spine: Unremarkable bone marrow signal. Sinuses/Orbits: Bilateral cataract extraction. Paranasal sinuses and mastoid air cells are clear. Other: 9 mm T2 hyperintense/cystic focus superficial to the left parotid gland. IMPRESSION: 1. No acute intracranial abnormality. 2. Mild chronic small vessel ischemic disease. Electronically Signed   By: Sebastian Ache M.D.   On: 08/18/2023 18:08        Scheduled Meds:  azelastine  1 spray Each Nare BID   And   fluticasone  1 spray Each Nare BID   B-complex with vitamin C  1 tablet Oral QHS   cholecalciferol  1,000 Units Oral BID   citalopram  10 mg Oral Daily   clopidogrel  75 mg Oral Daily   cyanocobalamin  1,000 mcg Oral Daily   folic acid  1 mg Oral Daily   folic acid  1 mg Oral Daily   heparin  5,000 Units  Subcutaneous Q8H   Ipratropium-Albuterol  2 puff Inhalation TID   latanoprost  1 drop Both Eyes QHS   losartan  25 mg Oral BID   memantine  10 mg Oral BID   metoprolol tartrate  50 mg Oral BID   montelukast  10 mg Oral QHS   multivitamin with minerals  1 tablet Oral Daily   multivitamin with minerals  1 tablet Oral Daily   nirmatrelvir/ritonavir (renal dosing)  2 tablet Oral BID   pantoprazole  40 mg Oral Daily   pravastatin  40 mg Oral QHS   rivastigmine  1.5 mg Oral BID   thiamine  100 mg Oral Daily   Continuous Infusions:   LOS: 0 days    Time spent: 40 minutes    Ramiro Harvest, MD [facility]   To contact the attending provider between 7A-7P or the covering provider during after hours 7P-7A, please log into the web site [email or web address] and access using universal [place] password for that web site. If you do not have the password, please call the hospital operator.  08/19/2023, 7:28 PM

## 2023-08-19 NOTE — Care Management Obs Status (Signed)
MEDICARE OBSERVATION STATUS NOTIFICATION   Patient Details  Name: David Pugh. MRN: [id number] Date of Birth: 19-Feb-1935   Medicare Observation Status Notification Given:  Yes    [name] 08/19/2023, 2:29 PM

## 2023-08-20 DIAGNOSIS — R29898 Other symptoms and signs involving the musculoskeletal system: Principal | ICD-10-CM

## 2023-08-20 DIAGNOSIS — M21371 Foot drop, right foot: Secondary | ICD-10-CM

## 2023-08-20 DIAGNOSIS — U071 COVID-19: Secondary | ICD-10-CM | POA: Diagnosis not present

## 2023-08-20 DIAGNOSIS — N401 Enlarged prostate with lower urinary tract symptoms: Secondary | ICD-10-CM

## 2023-08-20 DIAGNOSIS — W19XXXS Unspecified fall, sequela: Secondary | ICD-10-CM | POA: Diagnosis not present

## 2023-08-20 DIAGNOSIS — R531 Weakness: Secondary | ICD-10-CM | POA: Diagnosis not present

## 2023-08-20 DIAGNOSIS — M21372 Foot drop, left foot: Secondary | ICD-10-CM

## 2023-08-20 DIAGNOSIS — N138 Other obstructive and reflux uropathy: Secondary | ICD-10-CM

## 2023-08-20 LAB — CBC WITH DIFFERENTIAL/PLATELET
Abs Immature Granulocytes: 0.03 10*3/uL (ref 0.00–0.07)
Basophils Absolute: 0 10*3/uL (ref 0.0–0.1)
Basophils Relative: 0 %
Eosinophils Absolute: 0 10*3/uL (ref 0.0–0.5)
Eosinophils Relative: 0 %
HCT: 39.5 % (ref 39.0–52.0)
Hemoglobin: 13.4 g/dL (ref 13.0–17.0)
Immature Granulocytes: 0 %
Lymphocytes Relative: 22 %
Lymphs Abs: 1.9 10*3/uL (ref 0.7–4.0)
MCH: 31.8 pg (ref 26.0–34.0)
MCHC: 33.9 g/dL (ref 30.0–36.0)
MCV: 93.8 fL (ref 80.0–100.0)
Monocytes Absolute: 1.4 10*3/uL — ABNORMAL HIGH (ref 0.1–1.0)
Monocytes Relative: 16 %
Neutro Abs: 5.6 10*3/uL (ref 1.7–7.7)
Neutrophils Relative %: 62 %
Platelets: 233 10*3/uL (ref 150–400)
RBC: 4.21 MIL/uL — ABNORMAL LOW (ref 4.22–5.81)
RDW: 12.6 % (ref 11.5–15.5)
WBC: 9 10*3/uL (ref 4.0–10.5)
nRBC: 0 % (ref 0.0–0.2)

## 2023-08-20 LAB — BASIC METABOLIC PANEL
Anion gap: 11 (ref 5–15)
BUN: 16 mg/dL (ref 8–23)
CO2: 18 mmol/L — ABNORMAL LOW (ref 22–32)
Calcium: 8.2 mg/dL — ABNORMAL LOW (ref 8.9–10.3)
Chloride: 105 mmol/L (ref 98–111)
Creatinine, Ser: 1.13 mg/dL (ref 0.61–1.24)
GFR, Estimated: >60 mL/min (ref >60)
Glucose, Bld: 104 mg/dL — ABNORMAL HIGH (ref 70–99)
Potassium: 3.7 mmol/L (ref 3.5–5.1)
Sodium: 134 mmol/L — ABNORMAL LOW (ref 135–145)

## 2023-08-20 LAB — MAGNESIUM: Magnesium: 2 mg/dL (ref 1.7–2.4)

## 2023-08-20 LAB — C-REACTIVE PROTEIN: CRP: 6.3 mg/dL — ABNORMAL HIGH (ref <1.0)

## 2023-08-20 MED ORDER — MELATONIN 3 MG PO TABS
6.0000 mg | ORAL_TABLET | Freq: Every evening | ORAL | Status: DC | PRN
  Administered 2023-08-20: 6 mg via ORAL
  Filled 2023-08-20: qty 2

## 2023-08-20 MED ORDER — PRAVASTATIN SODIUM 40 MG PO TABS: 40.0000 mg | ORAL_TABLET | Freq: Every day | ORAL | Status: DC

## 2023-08-20 MED ORDER — IPRATROPIUM-ALBUTEROL 20-100 MCG/ACT IN AERS: INHALATION_SPRAY | RESPIRATORY_TRACT | 0 refills | Status: DC

## 2023-08-20 MED ORDER — NIRMATRELVIR/RITONAVIR (PAXLOVID) TABLET (RENAL DOSING): 2.0000 | ORAL_TABLET | Freq: Two times a day (BID) | ORAL | Status: AC

## 2023-08-20 NOTE — Discharge Summary (Signed)
Physician Discharge Summary  David Pugh. MVH:[id number] DOB: December 06, 1934 DOA: 08/18/2023  PCP: Billie Lade, MD  Admit date: 08/18/2023 Discharge date: 08/20/2023  Time spent: 55 minutes  Recommendations for Outpatient Follow-up:  Follow-up with Billie Lade, MD in 2 weeks.  On follow-up patient will need a basic metabolic profile done to follow-up on electrolytes and renal function.  Patient's/generalized weakness will need to be followed up upon.  Patient's COVID-19 will need to be followed up upon. Patient will be discharged with home health therapies.   Discharge Diagnoses:  Principal Problem:   Fall Active Problems:   CAD (coronary artery disease)   Benign prostatic hyperplasia with urinary obstruction   Bilateral foot-drop   COVID-19 virus infection   Hyperlipidemia   Discharge Condition: Stable and improved.  Diet recommendation: Heart healthy  Filed Weights   08/19/23 1141  Weight: 87 kg    History of present illness:  HPI per Dr. Valda Favia Cull  is a 88 y.o. male, with past medical history significant for hypertension, CLL, CAD, peripheral neuropathy, bilateral foot drop, T8 cancer, who presents to ED with complaints of multiple falls, reports he has started physical therapy last week, he had multiple sessions, he was given a few exercises to do he thinks he has been doing them excessively, and reports multiple falls, reports he walks when he feels his legs suddenly give out on him, no dizziness, no lightheadedness, no focal deficits, for his feeling of balance before falling out, no chest pain, no shortness of breath, no head trauma, he reports chronic pain in the right hip, with known history of peripheral neuropathy, bilateral foot drop, he denies any bladder or bowel continence or lose control. -In ED MRI brain obtained, with no evidence of acute CVA, white blood cell is elevated at 20.3 K, which is at baseline for his known CLL, UA was  negative, given multiple falls [facility] hospitalist consulted to admit.  Hospital Course:  #1 multiple falls/unsteady gait/deconditioning -Likely secondary to COVID infection/dehydration. -Patient noted with multiple falls, patient with significant right hip pain with known history of peripheral neuropathy, bilateral feet drop and osteoarthritis. -MRI brain done negative for acute CVA.  - MRI L-spine done with right lateral recess narrowing and mild right neuroforaminal stenosis at L4-5 due to asymmetric disc bulge and moderate facet arthrosis.  Moderate L5-S1 facet arthrosis.  No central spinal canal stenosis. -Patient stated findings on MRI L-spine chronic patient denied any bowel or urinary incontinence, denied any significant pain or weakness at time of assessment in his lower extremities. -Will need outpatient follow-up for MRI findings. -Patient was placed empirically on Paxlovid due to COVID infection. -Patient seen by PT/OT who recommended home health therapies. -Patient improved clinically by day of discharge and was able to ambulate on his own per patient.   2.  COVID infection -Was notified by RN after patient had reached the floor from the ED that patient's wife had just been diagnosed with COVID-19. -COVID-19 PCR obtained was positive. -Could likely be etiology of patient's multiple falls. -CRP remained elevated. -Patient denied any shortness of breath or chest pain and improved clinically during the hospitalization in terms of his generalized weakness. -Patient was not hypoxic and as such was not placed on steroids.  -Due to patient's advanced age, comorbidities of CLL, patient started on Paxlovid which she will be discharged on to complete a 5-day course of treatment.   -Patient also placed on scheduled Combivent.   -Patient was discharged  in stable and improved condition with close outpatient follow-up with PCP.    3.  CAD status post PCI 2012, 2015 -Patient maintained on home  regimen Plavix, Cozaar, Lopressor, Pravachol.   4.  Hypertension -Blood pressure was controlled on home regimen of Cozaar, Lopressor.   5.  Hyperlipidemia -Patient maintained on statin.   6.  GERD -Patient maintained on PPI.   7.  History of CLL -Outpatient follow-up with primary oncologist.    Procedures: Chest x-ray 08/18/2023 MRI brain 08/18/2023 MRI L-spine 08/18/2023    Consultations: None  Discharge Exam: Vitals:   08/20/23 0710 08/20/23 0801  BP: (!) 146/63   Pulse: (!) 57   Resp:    Temp: 98.1 F (36.7 C)   SpO2: 97% 97%    General: NAD Cardiovascular: RRR no murmurs rubs or gallops.  No JVD.  No pitting lower extremity edema. Respiratory: Clear to auscultation bilaterally.  No wheezes, no crackles, no rhonchi.  Fair air movement.  Speaking in full sentences.  Discharge Instructions   Discharge Instructions     Diet - low sodium heart healthy   Complete by: As directed    Increase activity slowly   Complete by: As directed       Allergies as of 08/20/2023       Reactions   Shellfish Allergy Anaphylaxis   Sulfonamide Derivatives Diarrhea, Nausea Only        Medication List     TAKE these medications    acetaminophen 500 MG tablet Commonly known as: TYLENOL Take 500 mg by mouth every 6 (six) hours as needed for mild pain (pain score 1-3).   Azelastine-Fluticasone 137-50 MCG/ACT Susp Place 1 spray into the nose every 12 (twelve) hours.   cholecalciferol 25 MCG (1000 UNIT) tablet Commonly known as: VITAMIN D3 Take 1,000 Units by mouth 2 (two) times daily.   citalopram 10 MG tablet Commonly known as: CELEXA Take 10 mg by mouth daily.   clopidogrel 75 MG tablet Commonly known as: PLAVIX TAKE 1 TABLET BY MOUTH ONCE DAILY.   cyanocobalamin 1000 MCG tablet Commonly known as: VITAMIN B12 Take 1,000 mcg by mouth daily.   folic acid 1 MG tablet Commonly known as: FOLVITE TAKE (1) TABLET BY MOUTH ONCE DAILY . What changed: See the  new instructions.   guaiFENesin 600 MG 12 hr tablet Commonly known as: MUCINEX Take 600 mg by mouth 2 (two) times daily as needed for cough or to loosen phlegm.   Ipratropium-Albuterol 20-100 MCG/ACT Aers respimat Commonly known as: COMBIVENT Inhale 2 puffs into the lungs 3 (three) times daily for 4 days, THEN 2 puffs every 6 (six) hours as needed. Start taking on: August 20, 2023   latanoprost 0.005 % ophthalmic solution Commonly known as: XALATAN 1 DROP IN [facility] EYE AT BEDTIME.   losartan 50 MG tablet Commonly known as: COZAAR Take 25 mg by mouth 2 (two) times daily.   memantine 10 MG tablet Commonly known as: Namenda Take 1 tablet (10 mg total) by mouth 2 (two) times daily.   metoprolol tartrate 50 MG tablet Commonly known as: LOPRESSOR Take 50 mg by mouth 2 (two) times daily.   montelukast 10 MG tablet Commonly known as: SINGULAIR Take 10 mg by mouth at bedtime.   multivitamin with minerals Tabs tablet Take 1 tablet by mouth daily.   nirmatrelvir/ritonavir (renal dosing) 10 x 150 MG & 10 x 100MG  Tabs Commonly known as: PAXLOVID Take 2 tablets by mouth 2 (two) times daily for 5  days. Patient GFR is 60. Take nirmatrelvir (150 mg) one tablet twice daily for 5 days and ritonavir (100 mg) one tablet twice daily for 5 days.   nitroGLYCERIN 0.4 MG SL tablet Commonly known as: NITROSTAT Place 1 tablet (0.4 mg total) under the tongue every 5 (five) minutes x 3 doses as needed for chest pain (if no relief after 3rd dose, proceed to ED or call 911).   pantoprazole 40 MG tablet Commonly known as: PROTONIX TAKE (1) TABLET TWICE DAILY. What changed: See the new instructions.   pravastatin 40 MG tablet Commonly known as: PRAVACHOL take 1 tablet by mouth at bedtime.   Prevagen Extra Strength 20 MG Caps Generic drug: Apoaequorin Take 1 capsule by mouth daily.   rivastigmine 1.5 MG capsule Commonly known as: EXELON Take 1 capsule (1.5 mg total) by mouth 2 (two) times  daily.   VITAMIN B COMPLEX PO Take 1 tablet by mouth at bedtime.       Allergies  Allergen Reactions   Shellfish Allergy Anaphylaxis   Sulfonamide Derivatives Diarrhea and Nausea Only    Follow-up Information     [facility] Follow up.   Specialty: Home Health Services Why: PT will call you to schedule your first home visit. Contact information: [address] [place] [address] [phone]         Billie Lade, MD. Schedule an appointment as soon as possible for a visit in 2 week(s).   Specialty: Internal Medicine Contact information: [address] [place] [address] [phone]                  The results of significant diagnostics from this hospitalization (including imaging, microbiology, ancillary and laboratory) are listed below for reference.    Significant Diagnostic Studies: MR LUMBAR SPINE WO CONTRAST Result Date: 08/18/2023 CLINICAL DATA:  Lumbar radiculopathy. Right lower extremity weakness EXAM: MRI LUMBAR SPINE WITHOUT CONTRAST TECHNIQUE: Multiplanar, multisequence MR imaging of the lumbar spine was performed. No intravenous contrast was administered. COMPARISON:  None Available. FINDINGS: Segmentation:  Standard Alignment:  Grade 1 retrolisthesis at L2-3 Vertebrae: Low T1/high T2 signal marrow lesion at L3 is favored to be a lipid poor hemangioma. No acute fracture. No discitis-osteomyelitis. Conus medullaris and cauda equina: Conus extends to the L1 level. Conus and cauda equina appear normal. Paraspinal and other soft tissues: Negative Disc levels: L1-L2: Normal disc space and facet joints. No spinal canal stenosis. No neural foraminal stenosis. L2-L3: Small disc bulge. No spinal canal stenosis. No neural foraminal stenosis. L3-L4: Small disc bulge and mild facet hypertrophy. No spinal canal stenosis. No neural foraminal stenosis. L4-L5: Disc space narrowing with intermediate sized right asymmetric bulge and  moderate facet arthrosis. Right lateral recess narrowing without central spinal canal stenosis. Mild right neural foraminal stenosis. L5-S1: Moderate facet arthrosis. No disc herniation. No spinal canal stenosis. No neural foraminal stenosis. Visualized sacrum: Normal. IMPRESSION: 1. Right lateral recess narrowing and mild right neural foraminal stenosis at L4-L5 due to asymmetric disc bulge and moderate facet arthrosis. 2. Moderate L5-S1 facet arthrosis. 3. No central spinal canal stenosis. Electronically Signed   By: Deatra Robinson M.D.   On: 08/18/2023 23:32   DG Chest Portable 1 View Result Date: 08/18/2023 CLINICAL DATA:  Worsening productive cough. EXAM: PORTABLE CHEST 1 VIEW COMPARISON:  Chest x-ray dated May 01, 2021. FINDINGS: Stable cardiomediastinal silhouette with normal heart size. Normal pulmonary vascularity. Minimal linear scarring at both lung bases again noted.  No focal consolidation, pleural effusion, or pneumothorax. No acute osseous abnormality. IMPRESSION: No active disease. Electronically Signed   By: Obie Dredge M.D.   On: 08/18/2023 18:25   DG HIP UNILAT WITH PELVIS 2-3 VIEWS RIGHT Result Date: 08/18/2023 CLINICAL DATA:  Right hip pain and weakness.  Recent falls. EXAM: DG HIP (WITH OR WITHOUT PELVIS) 2-3V RIGHT COMPARISON:  CT abdomen pelvis dated May 01, 2021. FINDINGS: No acute fracture or dislocation. Mild bilateral hip joint space narrowing with small marginal osteophytes. Soft tissues are unremarkable. IMPRESSION: 1. Mild bilateral hip osteoarthritis. Electronically Signed   By: Obie Dredge M.D.   On: 08/18/2023 18:23   MR BRAIN WO CONTRAST Result Date: 08/18/2023 CLINICAL DATA:  Neuro deficit, acute, stroke suspected. Right lower extremity weakness. EXAM: MRI HEAD WITHOUT CONTRAST TECHNIQUE: Multiplanar, multiecho pulse sequences of the brain and surrounding structures were obtained without intravenous contrast. COMPARISON:  Head CT 07/01/2016 FINDINGS:  Brain: There is no evidence of an acute infarct, intracranial hemorrhage, mass, midline shift, or extra-axial fluid collection. Mild generalized cerebral atrophy is within normal for age. Small T2 hyperintensities in the cerebral white matter nonspecific but compatible with mild chronic small vessel ischemic disease. Vascular: Major intracranial vascular flow voids are preserved. Skull and upper cervical spine: Unremarkable bone marrow signal. Sinuses/Orbits: Bilateral cataract extraction. Paranasal sinuses and mastoid air cells are clear. Other: 9 mm T2 hyperintense/cystic focus superficial to the left parotid gland. IMPRESSION: 1. No acute intracranial abnormality. 2. Mild chronic small vessel ischemic disease. Electronically Signed   By: Sebastian Ache M.D.   On: 08/18/2023 18:08   DG Knee AP/LAT W/Sunrise Right Result Date: 08/08/2023 X-rays of the right knee were obtained in clinic today.  No acute injuries noted.  Neutral overall alignment.  Well-maintained joint space.  There are some osteophytes within the medial lateral compartment.  Within the patellofemoral compartment, there is loss of joint space, as well as medial and lateral facet osteophytes.  On the lateral view, superior and inferior osteophytes are appreciated on the patella.  Impression: Mild to moderate degenerative changes of the right knee.     Microbiology: Recent Results (from the past 240 hours)  SARS Coronavirus 2 by RT PCR (hospital order, performed in [facility] hospital lab) *cepheid single result test* Anterior Nasal Swab     Status: Abnormal   Collection Time: 08/19/23  5:49 PM   Specimen: Anterior Nasal Swab  Result Value Ref Range Status   SARS Coronavirus 2 by RT PCR POSITIVE (A) NEGATIVE Final    Comment: (NOTE) SARS-CoV-2 target nucleic acids are DETECTED  SARS-CoV-2 RNA is generally detectable in upper respiratory specimens  during the acute phase of infection.  Positive results are indicative  of the presence  of the identified virus, but do not rule out bacterial infection or co-infection with other pathogens not detected by the test.  Clinical correlation with patient history and  other diagnostic information is necessary to determine patient infection status.  The expected result is negative.  Fact Sheet for Patients:   RoadLapTop.co.za   Fact Sheet for Healthcare Providers:   http://kim-miller.com/    This test is not yet approved or cleared by the [place] FDA and  has been authorized for detection and/or diagnosis of SARS-CoV-2 by FDA under an Emergency Use Authorization (EUA).  This EUA will remain in effect (meaning this test can be used) for the duration of  the COVID-19 declaration under Section 564(b)(1)  of the Act, 21 U.S.C. section 360-bbb-3(b)(1),  unless the authorization is terminated or revoked sooner.   Performed at [facility], [address] [place], [place] [address]      Labs: Basic Metabolic Panel: Recent Labs  Lab 08/18/23 1450 08/19/23 0310  NA 136 137  K 3.8 3.6  CL 104 104  CO2 22 24  GLUCOSE 120* 101*  BUN 16 15  CREATININE 1.14 1.10  CALCIUM 8.8* 8.6*   Liver Function Tests: Recent Labs  Lab 08/18/23 1450  AST 30  ALT 27  ALKPHOS 58  BILITOT 1.0  PROT 6.4*  ALBUMIN 3.6   No results for input(s): "LIPASE", "AMYLASE" in the last 168 hours. No results for input(s): "AMMONIA" in the last 168 hours. CBC: Recent Labs  Lab 08/18/23 1450 08/19/23 0310 08/20/23 0916  WBC 20.3* 16.0* 9.0  NEUTROABS 15.6*  --  5.6  HGB 13.7 13.5 13.4  HCT 40.7 41.4 39.5  MCV 94.4 95.0 93.8  PLT 277 252 233   Cardiac Enzymes: No results for input(s): "CKTOTAL", "CKMB", "CKMBINDEX", "TROPONINI" in the last 168 hours. BNP: BNP (last 3 results) No results for input(s): "BNP" in the last 8760 hours.  ProBNP (last 3 results) No results for input(s): "PROBNP" in the last 8760 hours.  CBG: No results for  input(s): "GLUCAP" in the last 168 hours.     Signed:  Ramiro Harvest MD.  [facility] 08/20/2023, 1:04 PM

## 2023-09-05 ENCOUNTER — Other Ambulatory Visit: Payer: Self-pay | Admitting: Cardiology

## 2023-09-06 ENCOUNTER — Telehealth: Payer: Self-pay

## 2023-09-06 ENCOUNTER — Ambulatory Visit: Payer: Self-pay | Admitting: Internal Medicine

## 2023-09-06 NOTE — Telephone Encounter (Signed)
 Copied from CRM [id number]. Topic: Clinical - Home Health Verbal Orders >> Sep 06, 2023  1:32 PM Graeme ORN wrote: Caller/Agency: [facility] Callback Number:  [phone] secure voicemail Service Requested: Physical Therapy Frequency: 1st visit today frequency requested:  2 week 3 and 1 week 4  Any new concerns about the patient? No

## 2023-09-06 NOTE — Telephone Encounter (Signed)
 Left vm for ben

## 2023-09-07 ENCOUNTER — Ambulatory Visit (INDEPENDENT_AMBULATORY_CARE_PROVIDER_SITE_OTHER): Payer: Medicare PPO | Admitting: Internal Medicine

## 2023-09-07 ENCOUNTER — Encounter: Payer: Self-pay | Admitting: Internal Medicine

## 2023-09-07 VITALS — BP 122/65 | HR 71 | Wt 197.2 lb

## 2023-09-07 DIAGNOSIS — R053 Chronic cough: Secondary | ICD-10-CM | POA: Diagnosis not present

## 2023-09-07 DIAGNOSIS — Z09 Encounter for follow-up examination after completed treatment for conditions other than malignant neoplasm: Secondary | ICD-10-CM | POA: Diagnosis not present

## 2023-09-07 DIAGNOSIS — J302 Other seasonal allergic rhinitis: Secondary | ICD-10-CM

## 2023-09-07 DIAGNOSIS — J209 Acute bronchitis, unspecified: Secondary | ICD-10-CM | POA: Diagnosis not present

## 2023-09-07 DIAGNOSIS — U099 Post covid-19 condition, unspecified: Secondary | ICD-10-CM

## 2023-09-07 MED ORDER — METHYLPREDNISOLONE 4 MG PO TBPK: ORAL_TABLET | ORAL | 0 refills | Status: DC

## 2023-09-07 MED ORDER — GUAIFENESIN-CODEINE 100-10 MG/5ML PO SOLN: 5.0000 mL | Freq: Three times a day (TID) | ORAL | 0 refills | Status: DC | PRN

## 2023-09-07 MED ORDER — ALBUTEROL SULFATE HFA 108 (90 BASE) MCG/ACT IN AERS: 2.0000 | INHALATION_SPRAY | Freq: Four times a day (QID) | RESPIRATORY_TRACT | 0 refills | Status: DC | PRN

## 2023-09-07 NOTE — Progress Notes (Signed)
 Acute Office Visit  Subjective:    Patient ID: David Gilman., male    DOB: [birth year], 89 y.o.   MRN: [id number]  Chief Complaint  Patient presents with   Cough    Cough for 6 weeks, clear mucus    HPI Patient is in today for c/o persistent cough for the last 6 weeks.  He was at admitted at [facility] for COVID-19 infection from 08/18/23-08/20/23.  He was given Paxlovid  for it.  He has had persistent cough since then, with clear expectoration.  He has not tried Mucinex  that was advised from hospital.  He was also given Combivent  inhaler from hospital, but has not used it recently.  He feels shortness of breath upon exertion.  Denies any fever or chills currently.  Past Medical History:  Diagnosis Date   Abnormality of gait 12/18/2013   Arthritis    Basal cell carcinoma of scalp and skin of neck    Bilateral foot-drop 04/02/2019   Biliary dyskinesia    CAD (coronary artery disease) 2012   a. DES x2 in ramus/OM and mid LAD in 2012  b. balloon angioplaty of OM 2015   CLL (chronic lymphocytic leukemia) (HCC)    Colon cancer (HCC) 2013   Right hemicolectomy, did not tolerate chemotherapy   Colon polyps    tubular adenomas   Diverticula of colon 2010   Essential hypertension    Exposure to Agent Orange    Folic acid  deficiency    Gastroparesis 2014   GERD (gastroesophageal reflux disease)    Glaucoma    Heart palpitations 2014   Hiatal hernia 2010   History of anemia as a child    History of kidney stones    Iron  deficiency 2013   Myocardial infarction [facility]) 2012   Peripheral neuropathy    Prostate cancer (HCC) 2010   Radiation, surgery   PTSD (post-traumatic stress disorder)    Radiation proctitis    Rosacea     Past Surgical History:  Procedure Laterality Date   CARDIAC CATHETERIZATION  02/13/2011   [facility], [facility] cardiology   CATARACT EXTRACTION     2011   CATARACT EXTRACTION W/PHACO Right 08/06/2013   Procedure: CATARACT EXTRACTION PHACO AND  INTRAOCULAR LENS PLACEMENT (IOC);  Surgeon: Cherene Mania, MD;  Location: [facility] OR[facility];  Service: Ophthalmology;  Laterality: Right;  CDE:20.96   CHOLECYSTECTOMY     COLONOSCOPY  02/12/2004   Dr. Shaaron- L side diverticula, inflammatory  colon polyps   COLONOSCOPY  01/19/2012   RMR: Prominent changes involving the rectal mucosa consistent with radiation-induced proctitis. Multiple colonic  polyps removed as described above. Sigmoid Diverticulosis/ 1.5 x 2 cm relatively flat ulcerated lesion in cecum/path showed adenocarcinoma of the colon. descending colon polyp with tubular adenoma and one fragment of focal high grade dysplasia.    COLONOSCOPY  08/28/2012   MFM:Mjipjupnw proctitis. Colonic diverticulosis/Ulcerations at the surgical anastomosis  path: ulcerated colonic mucosa with prolapse changes, tubular adenoma.    COLONOSCOPY N/A 08/02/2013   RMR: Colonic polyps -removed as described above. Colonic diverticulosis. Status post reight hemicolectomy. Radiation proctitis- asymptomatic.    COLONOSCOPY N/A 05/09/2015   Procedure: COLONOSCOPY;  Surgeon: Lamar CHRISTELLA Shaaron, MD;  Location: [facility];  Service: Endoscopy;  Laterality: N/A;  1200-moved to [address] to notify pt   COLONOSCOPY WITH PROPOFOL  N/A 11/10/2017   Procedure: COLONOSCOPY WITH PROPOFOL ;  Surgeon: Shaaron Lamar CHRISTELLA, MD;  Location: [facility];  Service: Endoscopy;  Laterality: N/A;  CORONARY ANGIOPLASTY  06/17/14   OM1 POBA   CORONARY STENT PLACEMENT  01/2011   2.5x51mm Xience drug-eluting stent in ramus intermedius/OMI vessel, 2.5x27mm Promus Element stent in mid LAD artery   ESOPHAGOGASTRODUODENOSCOPY  10/10/2008   Dr. Kathyleen hiatal hernia, normal esophagus, normal stomach   ESOPHAGOGASTRODUODENOSCOPY  02/12/2004   Dr. Tyra- normal    ESOPHAGOGASTRODUODENOSCOPY N/A 05/09/2015   Procedure: ESOPHAGOGASTRODUODENOSCOPY (EGD);  Surgeon: Lamar CHRISTELLA Hollingshead, MD;  Location: [facility];  Service: Endoscopy;  Laterality: N/A;    ESOPHAGOGASTRODUODENOSCOPY (EGD) WITH PROPOFOL  N/A 11/10/2017   Procedure: ESOPHAGOGASTRODUODENOSCOPY (EGD) WITH PROPOFOL ;  Surgeon: Hollingshead Lamar CHRISTELLA, MD;  Location: [facility];  Service: Endoscopy;  Laterality: N/A;  9:45am   GIVENS CAPSULE STUDY N/A 06/03/2015   Procedure: GIVENS CAPSULE STUDY;  Surgeon: Lamar CHRISTELLA Hollingshead, MD;  Location: [facility];  Service: Endoscopy;  Laterality: N/A;  0700   HOT HEMOSTASIS  11/10/2017   Procedure: HOT HEMOSTASIS (ARGON PLASMA COAGULATION/BICAP);  Surgeon: Hollingshead Lamar CHRISTELLA, MD;  Location: [facility];  Service: Endoscopy;;  rectum   KNEE SURGERY     left knee    LEFT HEART CATHETERIZATION WITH CORONARY ANGIOGRAM N/A 06/17/2014   Procedure: LEFT HEART CATHETERIZATION WITH CORONARY ANGIOGRAM;  Surgeon: Lonni JONETTA Cash, MD;  Location: [facility] CATH LAB;  Service: Cardiovascular;  Laterality: N/A;   PARTIAL COLECTOMY  02/29/2012   Procedure: PARTIAL COLECTOMY;  Surgeon: Elon CHRISTELLA Pacini, MD;  Location: [facility];  Service: General;  Laterality: Right;   POLYPECTOMY  11/10/2017   Procedure: POLYPECTOMY;  Surgeon: Hollingshead Lamar CHRISTELLA, MD;  Location: [facility];  Service: Endoscopy;;  gastric colon   PROSTATECTOMY     TONSILLECTOMY     TRANSURETHRAL RESECTION OF PROSTATE     VASECTOMY      Family History  Problem Relation Age of Onset   Throat cancer Father        dx in his 59s and again in his 55s; pipe and cigar smoker   Neuropathy Brother    Prostate cancer Brother 46   Melanoma Brother        dx in his 17s   Cancer Paternal Uncle        smoking related cancer   Heart attack Paternal Grandfather    Stomach cancer Cousin 69   Colon cancer Neg Hx     Social History   Socioeconomic History   Marital status: Married    Spouse name: Not on file   Number of children: 3   Years of education: military   Highest education level: Not on file  Occupational History   Occupation: Retired    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  Tobacco Use   Smoking status: Never    Smokeless tobacco: Never  Vaping Use   Vaping status: Never Used  Substance and Sexual Activity   Alcohol use: Yes    Comment: 1 drink every 6 months   Drug use: No   Sexual activity: Not Currently  Other Topics Concern   Not on file  Social History Narrative   Right handed   Caffeine~1 cup per day    Lives at home with wife    Social Drivers of Health   Financial Resource Strain: Low Risk  (12/15/2022)   Received from [facility], [facility]   Overall Financial Resource Strain (CARDIA)    Difficulty of Paying Living Expenses: Not hard at all  Food Insecurity: No Food Insecurity (08/18/2023)   Hunger Vital Sign  Worried About Programme Researcher, Broadcasting/film/video in the Last Year: Never true    Ran Out of Food in the Last Year: Never true  Transportation Needs: No Transportation Needs (08/18/2023)   PRAPARE - Administrator, Civil Service (Medical): No    Lack of Transportation (Non-Medical): No  Physical Activity: Inactive (07/14/2020)   Exercise Vital Sign    Days of Exercise per Week: 0 days    Minutes of Exercise per Session: 0 min  Stress: No Stress Concern Present (12/15/2022)   Received from [facility], [facility] - Occupational Stress Questionnaire    Feeling of Stress : Not at all  Social Connections: Unknown (01/11/2022)   Received from [facility], [facility]   Social Network    Social Network: Not on file  Intimate Partner Violence: Not At Risk (08/18/2023)   Humiliation, Afraid, Rape, and Kick questionnaire    Fear of Current or Ex-Partner: No    Emotionally Abused: No    Physically Abused: No    Sexually Abused: No    Outpatient Medications Prior to Visit  Medication Sig Dispense Refill   acetaminophen  (TYLENOL ) 500 MG tablet Take 500 mg by mouth every 6 (six) hours as needed for mild pain (pain score 1-3).     Apoaequorin (PREVAGEN EXTRA STRENGTH) 20 MG CAPS Take 1 capsule by mouth daily.      Azelastine -Fluticasone  137-50 MCG/ACT SUSP Place 1 spray into the nose every 12 (twelve) hours. 23 g 1   B Complex Vitamins (VITAMIN B COMPLEX  PO) Take 1 tablet by mouth at bedtime.      cholecalciferol  (VITAMIN D3) 25 MCG (1000 UNIT) tablet Take 1,000 Units by mouth 2 (two) times daily.     citalopram  (CELEXA ) 10 MG tablet Take 10 mg by mouth daily.     clopidogrel  (PLAVIX ) 75 MG tablet take 1 tablet by mouth once daily. 30 tablet 11   folic acid  (FOLVITE ) 1 MG tablet TAKE (1) TABLET BY MOUTH ONCE DAILY . (Patient taking differently: Take 1 mg by mouth daily.) 30 tablet 0   guaiFENesin  (MUCINEX ) 600 MG 12 hr tablet Take 600 mg by mouth 2 (two) times daily as needed for cough or to loosen phlegm.     Ipratropium-Albuterol  (COMBIVENT ) 20-100 MCG/ACT AERS respimat Inhale 2 puffs into the lungs 3 (three) times daily for 4 days, THEN 2 puffs every 6 (six) hours as needed. 1 each 0   latanoprost  (XALATAN ) 0.005 % ophthalmic solution 1 DROP IN EACH EYE AT BEDTIME. 2.5 mL 12   losartan  (COZAAR ) 50 MG tablet Take 25 mg by mouth 2 (two) times daily.      memantine  (NAMENDA ) 10 MG tablet Take 1 tablet (10 mg total) by mouth 2 (two) times daily. 180 tablet 3   metoprolol  (LOPRESSOR ) 50 MG tablet Take 50 mg by mouth 2 (two) times daily.     montelukast  (SINGULAIR ) 10 MG tablet Take 10 mg by mouth at bedtime.     Multiple Vitamin (MULTIVITAMIN WITH MINERALS) TABS tablet Take 1 tablet by mouth daily.     nitroGLYCERIN  (NITROSTAT ) 0.4 MG SL tablet Place 1 tablet (0.4 mg total) under the tongue every 5 (five) minutes x 3 doses as needed for chest pain (if no relief after 3rd dose, proceed to ED or call 911). 25 tablet 3   pantoprazole  (PROTONIX ) 40 MG tablet TAKE (1) TABLET TWICE DAILY. (Patient taking differently: Take 40 mg by mouth  daily.) 60 tablet 5   pravastatin  (PRAVACHOL ) 40 MG tablet take 1 tablet by mouth at bedtime. 30 tablet 4   rivastigmine  (EXELON ) 1.5 MG capsule Take 1 capsule (1.5 mg total) by  mouth 2 (two) times daily. 180 capsule 3   vitamin B-12 (CYANOCOBALAMIN ) 1000 MCG tablet Take 1,000 mcg by mouth daily.     No facility-administered medications prior to visit.    Allergies  Allergen Reactions   Shellfish Allergy Anaphylaxis   Sulfonamide Derivatives Diarrhea and Nausea Only    Review of Systems  Constitutional:  Positive for fatigue. Negative for chills and fever.  HENT:  Negative for congestion and sore throat.   Eyes:  Negative for pain and discharge.  Respiratory:  Positive for cough and shortness of breath.   Cardiovascular:  Negative for chest pain and palpitations.  Gastrointestinal:  Negative for diarrhea, nausea and vomiting.  Endocrine: Negative for polydipsia and polyuria.  Genitourinary:  Negative for dysuria and hematuria.  Musculoskeletal:  Negative for neck pain and neck stiffness.  Skin:  Negative for rash.  Neurological:  Positive for weakness. Negative for dizziness.  Psychiatric/Behavioral:  Negative for agitation and behavioral problems.        Objective:    Physical Exam Vitals reviewed.  Constitutional:      General: He is not in acute distress.    Appearance: He is not diaphoretic.  HENT:     Head: Normocephalic and atraumatic.     Nose: Nose normal.     Mouth/Throat:     Mouth: Mucous membranes are moist.  Eyes:     General: No scleral icterus.    Extraocular Movements: Extraocular movements intact.  Cardiovascular:     Rate and Rhythm: Normal rate and regular rhythm.     Heart sounds: Normal heart sounds. No murmur heard. Pulmonary:     Breath sounds: Wheezing (Mild, diffuse) present. No rales.  Musculoskeletal:     Right lower leg: No edema.     Left lower leg: No edema.  Skin:    General: Skin is warm.     Findings: No rash.  Neurological:     General: No focal deficit present.     Mental Status: He is alert and oriented to person, place, and time.  Psychiatric:        Mood and Affect: Mood normal.        Behavior:  Behavior normal.     BP 122/65 (BP Location: Left Arm, Patient Position: Sitting, Cuff Size: Normal)   Pulse 71   Wt 197 lb 3.2 oz (89.4 kg)   SpO2 96%   BMI 27.50 kg/m  Wt Readings from Last 3 Encounters:  09/07/23 197 lb 3.2 oz (89.4 kg)  08/19/23 191 lb 12.8 oz (87 kg)  08/02/23 194 lb (88 kg)        Assessment & Plan:   Problem List Items Addressed This Visit       Respiratory   Seasonal allergic rhinitis   Continue Dymista  nasal spray      Acute bronchitis - Primary   Persistent cough and exertional dyspnea since COVID infection in 12/24 Started Medrol  Dosepak Albuterol  inhaler as needed for dyspnea or wheezing Guaifenesin -codeine  syrup as needed for cough - advised to avoid if he has drowsiness/dizziness due to codeine       Relevant Medications   methylPREDNISolone  (MEDROL  DOSEPAK) 4 MG TBPK tablet   guaiFENesin -codeine  100-10 MG/5ML syrup   albuterol  (VENTOLIN  HFA) 108 (90 Base) MCG/ACT inhaler  Other   Encounter for examination following treatment at hospital   Hospital chart reviewed, including imaging No recent falls - has participated in home PT for strength training       Post-COVID chronic cough   Started Medrol  dosepak Guaifenesin -codeine  syrup as needed for cough Reassured that his cough is mostly inflammatory currently, would not need further antiviral or antibiotics for now        Meds ordered this encounter  Medications   methylPREDNISolone  (MEDROL  DOSEPAK) 4 MG TBPK tablet    Sig: Take as package instructions.    Dispense:  1 each    Refill:  0   guaiFENesin -codeine  100-10 MG/5ML syrup    Sig: Take 5 mLs by mouth 3 (three) times daily as needed for cough.    Dispense:  120 mL    Refill:  0   albuterol  (VENTOLIN  HFA) 108 (90 Base) MCG/ACT inhaler    Sig: Inhale 2 puffs into the lungs every 6 (six) hours as needed for wheezing or shortness of breath.    Dispense:  16 g    Refill:  0    Okay to substitute to generic/formulary  Albuterol .     Suzzane MARLA Blanch, MD

## 2023-09-07 NOTE — Patient Instructions (Signed)
 Please start taking Prednisone as prescribed.  Please take Guaifenesin-codeine syrup as needed for cough.  Please use Albuterol inhaler as needed for shortness of breath or wheezing.

## 2023-09-08 ENCOUNTER — Inpatient Hospital Stay: Payer: Medicare PPO | Attending: Hematology

## 2023-09-08 DIAGNOSIS — Z8719 Personal history of other diseases of the digestive system: Secondary | ICD-10-CM | POA: Diagnosis not present

## 2023-09-08 DIAGNOSIS — Z8249 Family history of ischemic heart disease and other diseases of the circulatory system: Secondary | ICD-10-CM | POA: Insufficient documentation

## 2023-09-08 DIAGNOSIS — Z923 Personal history of irradiation: Secondary | ICD-10-CM | POA: Diagnosis not present

## 2023-09-08 DIAGNOSIS — Z09 Encounter for follow-up examination after completed treatment for conditions other than malignant neoplasm: Secondary | ICD-10-CM | POA: Insufficient documentation

## 2023-09-08 DIAGNOSIS — R059 Cough, unspecified: Secondary | ICD-10-CM | POA: Diagnosis not present

## 2023-09-08 DIAGNOSIS — Z9049 Acquired absence of other specified parts of digestive tract: Secondary | ICD-10-CM | POA: Insufficient documentation

## 2023-09-08 DIAGNOSIS — Z9221 Personal history of antineoplastic chemotherapy: Secondary | ICD-10-CM | POA: Diagnosis not present

## 2023-09-08 DIAGNOSIS — C9110 Chronic lymphocytic leukemia of B-cell type not having achieved remission: Secondary | ICD-10-CM | POA: Diagnosis present

## 2023-09-08 DIAGNOSIS — Z85038 Personal history of other malignant neoplasm of large intestine: Secondary | ICD-10-CM | POA: Insufficient documentation

## 2023-09-08 DIAGNOSIS — Z8546 Personal history of malignant neoplasm of prostate: Secondary | ICD-10-CM | POA: Diagnosis not present

## 2023-09-08 DIAGNOSIS — R053 Chronic cough: Secondary | ICD-10-CM | POA: Insufficient documentation

## 2023-09-08 LAB — CBC WITH DIFFERENTIAL/PLATELET
Abs Immature Granulocytes: 0.08 10*3/uL — ABNORMAL HIGH (ref 0.00–0.07)
Basophils Absolute: 0 10*3/uL (ref 0.0–0.1)
Basophils Relative: 0 %
Eosinophils Absolute: 0 10*3/uL (ref 0.0–0.5)
Eosinophils Relative: 0 %
HCT: 44.6 % (ref 39.0–52.0)
Hemoglobin: 14.3 g/dL (ref 13.0–17.0)
Immature Granulocytes: 1 %
Lymphocytes Relative: 30 %
Lymphs Abs: 4.3 10*3/uL — ABNORMAL HIGH (ref 0.7–4.0)
MCH: 30.6 pg (ref 26.0–34.0)
MCHC: 32.1 g/dL (ref 30.0–36.0)
MCV: 95.5 fL (ref 80.0–100.0)
Monocytes Absolute: 0.5 10*3/uL (ref 0.1–1.0)
Monocytes Relative: 4 %
Neutro Abs: 9.6 10*3/uL — ABNORMAL HIGH (ref 1.7–7.7)
Neutrophils Relative %: 65 %
Platelets: 406 10*3/uL — ABNORMAL HIGH (ref 150–400)
RBC: 4.67 MIL/uL (ref 4.22–5.81)
RDW: 12.9 % (ref 11.5–15.5)
WBC: 14.5 10*3/uL — ABNORMAL HIGH (ref 4.0–10.5)
nRBC: 0 % (ref 0.0–0.2)

## 2023-09-08 LAB — LACTATE DEHYDROGENASE: LDH: 200 U/L — ABNORMAL HIGH (ref 98–192)

## 2023-09-08 LAB — COMPREHENSIVE METABOLIC PANEL
ALT: 23 U/L (ref 0–44)
AST: 27 U/L (ref 15–41)
Albumin: 3.8 g/dL (ref 3.5–5.0)
Alkaline Phosphatase: 70 U/L (ref 38–126)
Anion gap: 9 (ref 5–15)
BUN: 16 mg/dL (ref 8–23)
CO2: 26 mmol/L (ref 22–32)
Calcium: 9 mg/dL (ref 8.9–10.3)
Chloride: 104 mmol/L (ref 98–111)
Creatinine, Ser: 1.04 mg/dL (ref 0.61–1.24)
GFR, Estimated: >60 mL/min (ref >60)
Glucose, Bld: 107 mg/dL — ABNORMAL HIGH (ref 70–99)
Potassium: 4 mmol/L (ref 3.5–5.1)
Sodium: 139 mmol/L (ref 135–145)
Total Bilirubin: 0.9 mg/dL (ref 0.0–1.2)
Total Protein: 7.1 g/dL (ref 6.5–8.1)

## 2023-09-08 NOTE — Assessment & Plan Note (Signed)
 Started Medrol dosepak Guaifenesin-codeine syrup as needed for cough Reassured that his cough is mostly inflammatory currently, would not need further antiviral or antibiotics for now

## 2023-09-08 NOTE — Assessment & Plan Note (Addendum)
 Hospital chart reviewed, including imaging No recent falls - has participated in home PT for strength training

## 2023-09-08 NOTE — Assessment & Plan Note (Signed)
 Continue Dymista nasal spray

## 2023-09-08 NOTE — Assessment & Plan Note (Signed)
 Persistent cough and exertional dyspnea since COVID infection in 12/24 Started Medrol  Dosepak Albuterol  inhaler as needed for dyspnea or wheezing Guaifenesin -codeine  syrup as needed for cough - advised to avoid if he has drowsiness/dizziness due to codeine 

## 2023-09-15 ENCOUNTER — Inpatient Hospital Stay: Payer: Medicare PPO | Admitting: Hematology

## 2023-09-15 VITALS — BP 149/63 | HR 50 | Temp 98.1°F | Resp 17 | Wt 195.8 lb

## 2023-09-15 DIAGNOSIS — C9110 Chronic lymphocytic leukemia of B-cell type not having achieved remission: Secondary | ICD-10-CM

## 2023-09-15 NOTE — Patient Instructions (Signed)
[facility] at [facility] Discharge Instructions   You were seen and examined today by Dr. Ellin Saba.  He reviewed the results of your lab work which are normal/stable.   We will see you back in 6 months.  We will repeat lab work prior to this visit.  Return as scheduled.   Thank you for choosing [facility] at [facility] to provide your oncology and hematology care.  To afford each patient quality time with our provider, please arrive at least 15 minutes before your scheduled appointment time.   If you have a lab appointment with the Cancer Center please come in thru the Main Entrance and check in at the main information desk.  You need to re-schedule your appointment should you arrive 10 or more minutes late.  We strive to give you quality time with our providers, and arriving late affects you and other patients whose appointments are after yours.  Also, if you no show three or more times for appointments you may be dismissed from the clinic at the providers discretion.     Again, thank you for choosing [facility].  Our hope is that these requests will decrease the amount of time that you wait before being seen by our physicians.       _____________________________________________________________  Should you have questions after your visit to [facility], please contact our office at [phone] and follow the prompts.  Our office hours are 8:00 a.m. and 4:30 p.m. Monday - Friday.  Please note that voicemails left after 4:00 p.m. may not be returned until the following business day.  We are closed weekends and major holidays.  You do have access to a nurse 24-7, just call the main number to the clinic [phone] and do not press any options, hold on the line and a nurse will answer the phone.    For prescription refill requests, have your pharmacy contact our office and allow 72 hours.    Due to Covid, you will  need to wear a mask upon entering the hospital. If you do not have a mask, a mask will be given to you at the Main Entrance upon arrival. For doctor visits, patients may have 1 support person age 79 or older with them. For treatment visits, patients can not have anyone with them due to social distancing guidelines and our immunocompromised population.

## 2023-09-15 NOTE — Progress Notes (Signed)
[facility] [address], [place] [address]    Clinic Day:  09/15/23   Referring physician: Catalina Lunger, DO  Patient Care Team: Billie Lade, MD as PCP - General (Internal Medicine) Jonelle Sidle, MD as PCP - Cardiology (Cardiology) Jena Gauss Gerrit Friends, MD (Gastroenterology) [name], MD as Medical Oncologist (Hematology) Jene Every, MD as Consulting Physician (Orthopedic Surgery)   ASSESSMENT & PLAN:   Assessment:  1.  Stage 0 CLL: - He was diagnosed by flow on 12/13/2014   2.  Stage III ascending colon cancer: - He had 2 out of 20 lymph nodes positive. -Status post right hemicolectomy on 02/29/2012, could not tolerate adjuvant Xeloda. - EGD showed multiple benign gastric polyps, otherwise within normal limits. -He is completed 5 years of surveillance.  No more scans needed at this time. -He had a colonoscopy on 11/10/2017 which showed radiation proctitis changes.  Diverticulosis in the sigmoid colon.  One polyp in the ascending colon which was removed   3.  Prostate cancer: -Underwent radiation therapy at [facility] in 2011. -He also underwent a TURP x2. - He follows up with Dr. Margo Aye his urologist in [facility].  Plan:  1.  Stage 0 CLL: - Denies any fevers, night sweats or weight loss in the last 6 months. - Had 1 episode of COVID-19 infection during Christmas. - He had finished steroids 2 days ago for the infection and cough. - Reviewed labs from 09/08/2023: LDH 200.  LFTs are normal.  CBC shows white count elevated at 14.5.  ANC and ALC are both high.  ANC could be high due to recent steroid use.  Hemoglobin is normal.  Platelet count could be reactive. - Physical exam: No palpable adenopathy or splenomegaly. - No indication for treatment.  RTC 6 months for follow-up.       Orders Placed This Encounter  Procedures   CBC with Differential    Standing Status:   Future    Expected Date:   03/07/2024    Expiration  Date:   09/14/2024   Comprehensive metabolic panel    Standing Status:   Future    Expected Date:   03/07/2024    Expiration Date:   09/14/2024   Lactate dehydrogenase    Standing Status:   Future    Expected Date:   03/07/2024    Expiration Date:   09/14/2024      Mikeal Hawthorne R Teague,acting as a scribe for [name], MD.,have documented all relevant documentation on the behalf of [name], MD,as directed by  [name], MD while in the presence of [name], MD.  I, [name] MD, have reviewed the above documentation for accuracy and completeness, and I agree with the above.    [name], MD   1/16/20253:32 PM  CHIEF COMPLAINT:   Diagnosis: CLL   Cancer Staging  Cancer of ascending colon [facility]) Staging form: Colon and Rectum, AJCC 7th Edition - Clinical: Stage IIIB (T3, N1c, M0) - Signed by Ellouise Newer, PA on 05/02/2012    Prior Therapy: intermittent feraheme (last infusion 01/03/2015)   Current Therapy:  surveillance    HISTORY OF PRESENT ILLNESS:   Oncology History  Cancer of ascending colon (HCC)  02/29/2012 Procedure   laparoscopic assisted R colectomy   03/09/2012 Miscellaneous   Iron deficiency and folic acid deficiency   03/29/2012 Initial Diagnosis   Cancer of ascending colon, CEA on 01/19/2012 was WNL   08/02/2013 Procedure  Ileocolonoscopy with snare polypectomy   06/11/2014 Imaging   CT abdomen/pelvis with no findings to suggest metastatic disease. Diverticulosis   06/11/2016 Imaging   CT Abdomen/Pelvis with no findings to suggest recurrent or metastatic disease. Prostatomegaly.       INTERVAL HISTORY:   David Pugh is a 89 y.o. male presenting to clinic today for follow up of CLL. He was last seen by me on 03/10/23.  Since his last visit, he was admitted to the hospital from 08/19/23 to 08/20/23 for multiple falls thought to be secondary to COVID infection. MRI of L-spine found right lateral  recess narrowing and mild right neuroforaminal stenosis at L4-5 due to asymmetric disc bulge and moderate facet arthrosis, as well as moderate L5-S1 facet arthrosis. He was started on Paxlovid while hospitalized.  Today, he states that he is doing well overall. His appetite level is at 100%. His energy level is at 90%. He is accompanied by his daughter.   He reports he is unable to access MyChart. He denies any fevers, night sweats, or unintentional weight loss.   When he was diagnosed with Covid, he had an associated cough and was prescribed cough syrup with steroids by Dr. Allena Katz that resolved the cough. He finished steroids 2 days ago. His appetite is normal.  PAST MEDICAL HISTORY:   Past Medical History: Past Medical History:  Diagnosis Date   Abnormality of gait 12/18/2013   Arthritis    Basal cell carcinoma of scalp and skin of neck    Bilateral foot-drop 04/02/2019   Biliary dyskinesia    CAD (coronary artery disease) 2012   a. DES x2 in ramus/OM and mid LAD in 2012  b. balloon angioplaty of OM 2015   CLL (chronic lymphocytic leukemia) (HCC)    Colon cancer (HCC) 2013   Right hemicolectomy, did not tolerate chemotherapy   Colon polyps    tubular adenomas   Diverticula of colon 2010   Essential hypertension    Exposure to Agent Orange    Folic acid deficiency    Gastroparesis 2014   GERD (gastroesophageal reflux disease)    Glaucoma    Heart palpitations 2014   Hiatal hernia 2010   History of anemia as a child    History of kidney stones    Iron deficiency 2013   Myocardial infarction [facility]) 2012   Peripheral neuropathy    Prostate cancer (HCC) 2010   Radiation, surgery   PTSD (post-traumatic stress disorder)    Radiation proctitis    Rosacea     Surgical History: Past Surgical History:  Procedure Laterality Date   CARDIAC CATHETERIZATION  02/13/2011   [facility] cardiology   CATARACT EXTRACTION     2011   CATARACT EXTRACTION W/PHACO Right 08/06/2013    Procedure: CATARACT EXTRACTION PHACO AND INTRAOCULAR LENS PLACEMENT (IOC);  Surgeon: Gemma Payor, MD;  Location: [facility];  Service: Ophthalmology;  Laterality: Right;  CDE:20.96   CHOLECYSTECTOMY     COLONOSCOPY  02/12/2004   Dr. Jena Gauss- L side diverticula, inflammatory  colon polyps   COLONOSCOPY  01/19/2012   RMR: Prominent changes involving the rectal mucosa consistent with radiation-induced proctitis. Multiple colonic  polyps removed as described above. Sigmoid Diverticulosis/ 1.5 x 2 cm relatively flat ulcerated lesion in cecum/path showed adenocarcinoma of the colon. descending colon polyp with tubular adenoma and one fragment of focal high grade dysplasia.    COLONOSCOPY  08/28/2012   EVO:[id number] proctitis. Colonic diverticulosis/Ulcerations at the surgical anastomosis  path: ulcerated colonic mucosa with prolapse  changes, tubular adenoma.    COLONOSCOPY N/A 08/02/2013   RMR: Colonic polyps -removed as described above. Colonic diverticulosis. Status post reight hemicolectomy. Radiation proctitis- asymptomatic.    COLONOSCOPY N/A 05/09/2015   Procedure: COLONOSCOPY;  Surgeon: Corbin Ade, MD;  Location: [facility];  Service: Endoscopy;  Laterality: N/A;  1200-moved to [address] to notify pt   COLONOSCOPY WITH PROPOFOL N/A 11/10/2017   Procedure: COLONOSCOPY WITH PROPOFOL;  Surgeon: Corbin Ade, MD;  Location: [facility];  Service: Endoscopy;  Laterality: N/A;   CORONARY ANGIOPLASTY  06/17/14   OM1 POBA   CORONARY STENT PLACEMENT  01/2011   2.5x70mm Xience drug-eluting stent in ramus intermedius/OMI vessel, 2.5x48mm Promus Element stent in mid LAD artery   ESOPHAGOGASTRODUODENOSCOPY  10/10/2008   Dr. Maceo Pro hiatal hernia, normal esophagus, normal stomach   ESOPHAGOGASTRODUODENOSCOPY  02/12/2004   Dr. Geni Bers- normal    ESOPHAGOGASTRODUODENOSCOPY N/A 05/09/2015   Procedure: ESOPHAGOGASTRODUODENOSCOPY (EGD);  Surgeon: Corbin Ade, MD;  Location: [facility];  Service: Endoscopy;   Laterality: N/A;   ESOPHAGOGASTRODUODENOSCOPY (EGD) WITH PROPOFOL N/A 11/10/2017   Procedure: ESOPHAGOGASTRODUODENOSCOPY (EGD) WITH PROPOFOL;  Surgeon: Corbin Ade, MD;  Location: [facility];  Service: Endoscopy;  Laterality: N/A;  9:45am   GIVENS CAPSULE STUDY N/A 06/03/2015   Procedure: GIVENS CAPSULE STUDY;  Surgeon: Corbin Ade, MD;  Location: [facility];  Service: Endoscopy;  Laterality: N/A;  0700   HOT HEMOSTASIS  11/10/2017   Procedure: HOT HEMOSTASIS (ARGON PLASMA COAGULATION/BICAP);  Surgeon: Corbin Ade, MD;  Location: [facility];  Service: Endoscopy;;  rectum   KNEE SURGERY     left knee    LEFT HEART CATHETERIZATION WITH CORONARY ANGIOGRAM N/A 06/17/2014   Procedure: LEFT HEART CATHETERIZATION WITH CORONARY ANGIOGRAM;  Surgeon: Kathleene Hazel, MD;  Location: [facility] CATH LAB;  Service: Cardiovascular;  Laterality: N/A;   PARTIAL COLECTOMY  02/29/2012   Procedure: PARTIAL COLECTOMY;  Surgeon: Ernestene Mention, MD;  Location: [facility];  Service: General;  Laterality: Right;   POLYPECTOMY  11/10/2017   Procedure: POLYPECTOMY;  Surgeon: Corbin Ade, MD;  Location: [facility];  Service: Endoscopy;;  gastric colon   PROSTATECTOMY     TONSILLECTOMY     TRANSURETHRAL RESECTION OF PROSTATE     VASECTOMY      Social History: Social History   Socioeconomic History   Marital status: Married    Spouse name: Not on file   Number of children: 3   Years of education: military   Highest education level: Not on file  Occupational History   Occupation: Retired    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  Tobacco Use   Smoking status: Never   Smokeless tobacco: Never  Vaping Use   Vaping status: Never Used  Substance and Sexual Activity   Alcohol use: Yes    Comment: 1 drink every 6 months   Drug use: No   Sexual activity: Not Currently  Other Topics Concern   Not on file  Social History Narrative   Right handed   Caffeine~1 cup per day    Lives at home with wife    Social  Drivers of Health   Financial Resource Strain: Low Risk  (12/15/2022)   Received from [facility], [facility]   Overall Financial Resource Strain (CARDIA)    Difficulty of Paying Living Expenses: Not hard at all  Food Insecurity: No Food Insecurity (08/18/2023)   Hunger Vital Sign  Worried About Programme researcher, broadcasting/film/video in the Last Year: Never true    Ran Out of Food in the Last Year: Never true  Transportation Needs: No Transportation Needs (08/18/2023)   PRAPARE - Administrator, Civil Service (Medical): No    Lack of Transportation (Non-Medical): No  Physical Activity: Inactive (07/14/2020)   Exercise Vital Sign    Days of Exercise per Week: 0 days    Minutes of Exercise per Session: 0 min  Stress: No Stress Concern Present (12/15/2022)   Received from [facility], [facility] of Occupational Health - Occupational Stress Questionnaire    Feeling of Stress : Not at all  Social Connections: Unknown (01/11/2022)   Received from [facility], [facility]   Social Network    Social Network: Not on file  Intimate Partner Violence: Not At Risk (08/18/2023)   Humiliation, Afraid, Rape, and Kick questionnaire    Fear of Current or Ex-Partner: No    Emotionally Abused: No    Physically Abused: No    Sexually Abused: No    Family History: Family History  Problem Relation Age of Onset   Throat cancer Father        dx in his 64s and again in his 63s; pipe and cigar smoker   Neuropathy Brother    Prostate cancer Brother 85   Melanoma Brother        dx in his 41s   Cancer Paternal Uncle        smoking related cancer   Heart attack Paternal Grandfather    Stomach cancer Cousin 35   Colon cancer Neg Hx     Current Medications:  Current Outpatient Medications:    acetaminophen (TYLENOL) 500 MG tablet, Take 500 mg by mouth every 6 (six) hours as needed for mild pain (pain score 1-3)., Disp: , Rfl:    albuterol (VENTOLIN HFA) 108 (90  Base) MCG/ACT inhaler, Inhale 2 puffs into the lungs every 6 (six) hours as needed for wheezing or shortness of breath., Disp: 16 g, Rfl: 0   Apoaequorin (PREVAGEN EXTRA STRENGTH) 20 MG CAPS, Take 1 capsule by mouth daily., Disp: , Rfl:    Azelastine-Fluticasone 137-50 MCG/ACT SUSP, Place 1 spray into the nose every 12 (twelve) hours., Disp: 23 g, Rfl: 1   B Complex Vitamins (VITAMIN B COMPLEX PO), Take 1 tablet by mouth at bedtime. , Disp: , Rfl:    cholecalciferol (VITAMIN D3) 25 MCG (1000 UNIT) tablet, Take 1,000 Units by mouth 2 (two) times daily., Disp: , Rfl:    citalopram (CELEXA) 10 MG tablet, Take 10 mg by mouth daily., Disp: , Rfl:    clopidogrel (PLAVIX) 75 MG tablet, take 1 tablet by mouth once daily., Disp: 30 tablet, Rfl: 11   folic acid (FOLVITE) 1 MG tablet, TAKE (1) TABLET BY MOUTH ONCE DAILY . (Patient taking differently: Take 1 mg by mouth daily.), Disp: 30 tablet, Rfl: 0   guaiFENesin (MUCINEX) 600 MG 12 hr tablet, Take 600 mg by mouth 2 (two) times daily as needed for cough or to loosen phlegm., Disp: , Rfl:    guaiFENesin-codeine 100-10 MG/5ML syrup, Take 5 mLs by mouth 3 (three) times daily as needed for cough., Disp: 120 mL, Rfl: 0   Ipratropium-Albuterol (COMBIVENT) 20-100 MCG/ACT AERS respimat, Inhale 2 puffs into the lungs 3 (three) times daily for 4 days, THEN 2 puffs every 6 (six) hours as needed., Disp: 1 each, Rfl: 0  latanoprost (XALATAN) 0.005 % ophthalmic solution, 1 DROP IN EACH EYE AT BEDTIME., Disp: 2.5 mL, Rfl: 12   losartan (COZAAR) 50 MG tablet, Take 25 mg by mouth 2 (two) times daily. , Disp: , Rfl:    memantine (NAMENDA) 10 MG tablet, Take 1 tablet (10 mg total) by mouth 2 (two) times daily., Disp: 180 tablet, Rfl: 3   methylPREDNISolone (MEDROL DOSEPAK) 4 MG TBPK tablet, Take as package instructions., Disp: 1 each, Rfl: 0   metoprolol (LOPRESSOR) 50 MG tablet, Take 50 mg by mouth 2 (two) times daily., Disp: , Rfl:    montelukast (SINGULAIR) 10 MG tablet,  Take 10 mg by mouth at bedtime., Disp: , Rfl:    Multiple Vitamin (MULTIVITAMIN WITH MINERALS) TABS tablet, Take 1 tablet by mouth daily., Disp: , Rfl:    nitroGLYCERIN (NITROSTAT) 0.4 MG SL tablet, Place 1 tablet (0.4 mg total) under the tongue every 5 (five) minutes x 3 doses as needed for chest pain (if no relief after 3rd dose, proceed to ED or call 911)., Disp: 25 tablet, Rfl: 3   pantoprazole (PROTONIX) 40 MG tablet, TAKE (1) TABLET TWICE DAILY. (Patient taking differently: Take 40 mg by mouth daily.), Disp: 60 tablet, Rfl: 5   pravastatin (PRAVACHOL) 40 MG tablet, take 1 tablet by mouth at bedtime., Disp: 30 tablet, Rfl: 4   rivastigmine (EXELON) 1.5 MG capsule, Take 1 capsule (1.5 mg total) by mouth 2 (two) times daily., Disp: 180 capsule, Rfl: 3   vitamin B-12 (CYANOCOBALAMIN) 1000 MCG tablet, Take 1,000 mcg by mouth daily., Disp: , Rfl:    Allergies: Allergies  Allergen Reactions   Shellfish Allergy Anaphylaxis   Sulfonamide Derivatives Diarrhea and Nausea Only    REVIEW OF SYSTEMS:   Review of Systems  Constitutional:  Negative for chills, fatigue and fever.  HENT:   Negative for lump/mass, mouth sores, nosebleeds, sore throat and trouble swallowing.   Eyes:  Negative for eye problems.  Respiratory:  Positive for cough. Negative for shortness of breath.   Cardiovascular:  Negative for chest pain, leg swelling and palpitations.  Gastrointestinal:  Positive for diarrhea. Negative for abdominal pain, constipation, nausea and vomiting.  Genitourinary:  Negative for bladder incontinence, difficulty urinating, dysuria, frequency, hematuria and nocturia.   Musculoskeletal:  Negative for arthralgias, back pain, flank pain, myalgias and neck pain.  Skin:  Negative for itching and rash.  Neurological:  Negative for dizziness, headaches and numbness.  Hematological:  Does not bruise/bleed easily.  Psychiatric/Behavioral:  Negative for depression, sleep disturbance and suicidal ideas. The  patient is not nervous/anxious.   All other systems reviewed and are negative.    VITALS:   Blood pressure (!) 149/63, pulse (!) 50, temperature 98.1 F (36.7 C), temperature source Oral, resp. rate 17, weight 195 lb 12.8 oz (88.8 kg), SpO2 99%.  Wt Readings from Last 3 Encounters:  09/15/23 195 lb 12.8 oz (88.8 kg)  09/07/23 197 lb 3.2 oz (89.4 kg)  08/19/23 191 lb 12.8 oz (87 kg)    Body mass index is 27.31 kg/m.  Performance status (ECOG): 1 - Symptomatic but completely ambulatory  PHYSICAL EXAM:   Physical Exam Vitals and nursing note reviewed. Exam conducted with a chaperone present.  Constitutional:      Appearance: Normal appearance.  Cardiovascular:     Rate and Rhythm: Normal rate and regular rhythm.     Pulses: Normal pulses.     Heart sounds: Normal heart sounds.  Pulmonary:     Effort: Pulmonary  effort is normal.     Breath sounds: Normal breath sounds.  Abdominal:     Palpations: Abdomen is soft. There is no hepatomegaly, splenomegaly or mass.     Tenderness: There is no abdominal tenderness.  Musculoskeletal:     Right lower leg: No edema.     Left lower leg: No edema.  Lymphadenopathy:     Cervical: No cervical adenopathy.     Right cervical: No superficial, deep or posterior cervical adenopathy.    Left cervical: No superficial, deep or posterior cervical adenopathy.     Upper Body:     Right upper body: No supraclavicular or axillary adenopathy.     Left upper body: No supraclavicular or axillary adenopathy.  Neurological:     General: No focal deficit present.     Mental Status: He is alert and oriented to person, place, and time.  Psychiatric:        Mood and Affect: Mood normal.        Behavior: Behavior normal.     LABS:      Latest Ref Rng & Units 09/08/2023    1:50 PM 08/20/2023    9:16 AM 08/19/2023    3:10 AM  CBC  WBC 4.0 - 10.5 K/uL 14.5  9.0  16.0   Hemoglobin 13.0 - 17.0 g/dL 16.1  09.6  04.5   Hematocrit 39.0 - 52.0 % 44.6   39.5  41.4   Platelets 150 - 400 K/uL 406  233  252       Latest Ref Rng & Units 09/08/2023    1:50 PM 08/20/2023    9:16 AM 08/19/2023    3:10 AM  CMP  Glucose 70 - 99 mg/dL 409  811  914   BUN 8 - 23 mg/dL 16  16  15    Creatinine 0.61 - 1.24 mg/dL 7.82  9.56  2.13   Sodium 135 - 145 mmol/L 139  134  137   Potassium 3.5 - 5.1 mmol/L 4.0  3.7  3.6   Chloride 98 - 111 mmol/L 104  105  104   CO2 22 - 32 mmol/L 26  18  24    Calcium 8.9 - 10.3 mg/dL 9.0  8.2  8.6   Total Protein 6.5 - 8.1 g/dL 7.1     Total Bilirubin 0.0 - 1.2 mg/dL 0.9     Alkaline Phos 38 - 126 U/L 70     AST 15 - 41 U/L 27     ALT 0 - 44 U/L 23        Lab Results  Component Value Date   CEA1 1.4 09/01/2022   CEA 1.6 07/11/2015   /  CEA  Date Value Ref Range Status  09/01/2022 1.4 0.0 - 4.7 ng/mL Final    Comment:    (NOTE)                             Nonsmokers          <3.9                             Smokers             <5.6 Roche [employer] Electrochemiluminescence Immunoassay (ECLIA) Values obtained with different assay methods or kits cannot be used interchangeably.  Results cannot be interpreted as absolute evidence of the presence or absence of malignant disease. Performed  At: [facility] [address] [place], [place] [id number] Jolene Schimke MD [id number]   07/11/2015 1.6 0.0 - 4.7 ng/mL Final    Comment:    (NOTE)       Roche ECLIA methodology       Nonsmokers  <3.9                                     Smokers     <5.6 Performed At: [facility] [address] [place], [place] [id number] Mila Homer MD [id number]    No results found for: "PSA1" No results found for: "CAN199" No results found for: "CAN125"  No results found for: "TOTALPROTELP", "ALBUMINELP", "A1GS", "A2GS", "BETS", "BETA2SER", "GAMS", "MSPIKE", "SPEI" Lab Results  Component Value Date   TIBC 327 01/13/2021   TIBC 336 07/09/2020   TIBC 273 05/30/2013   FERRITIN 67 01/13/2021    FERRITIN 52 07/09/2020   FERRITIN 71 09/27/2016   IRONPCTSAT 47 (H) 01/13/2021   IRONPCTSAT 34 07/09/2020   IRONPCTSAT 74 (H) 05/30/2013   Lab Results  Component Value Date   LDH 200 (H) 09/08/2023   LDH 147 03/10/2023   LDH 111 09/01/2022     STUDIES:   MR LUMBAR SPINE WO CONTRAST Result Date: 08/18/2023 CLINICAL DATA:  Lumbar radiculopathy. Right lower extremity weakness EXAM: MRI LUMBAR SPINE WITHOUT CONTRAST TECHNIQUE: Multiplanar, multisequence MR imaging of the lumbar spine was performed. No intravenous contrast was administered. COMPARISON:  None Available. FINDINGS: Segmentation:  Standard Alignment:  Grade 1 retrolisthesis at L2-3 Vertebrae: Low T1/high T2 signal marrow lesion at L3 is favored to be a lipid poor hemangioma. No acute fracture. No discitis-osteomyelitis. Conus medullaris and cauda equina: Conus extends to the L1 level. Conus and cauda equina appear normal. Paraspinal and other soft tissues: Negative Disc levels: L1-L2: Normal disc space and facet joints. No spinal canal stenosis. No neural foraminal stenosis. L2-L3: Small disc bulge. No spinal canal stenosis. No neural foraminal stenosis. L3-L4: Small disc bulge and mild facet hypertrophy. No spinal canal stenosis. No neural foraminal stenosis. L4-L5: Disc space narrowing with intermediate sized right asymmetric bulge and moderate facet arthrosis. Right lateral recess narrowing without central spinal canal stenosis. Mild right neural foraminal stenosis. L5-S1: Moderate facet arthrosis. No disc herniation. No spinal canal stenosis. No neural foraminal stenosis. Visualized sacrum: Normal. IMPRESSION: 1. Right lateral recess narrowing and mild right neural foraminal stenosis at L4-L5 due to asymmetric disc bulge and moderate facet arthrosis. 2. Moderate L5-S1 facet arthrosis. 3. No central spinal canal stenosis. Electronically Signed   By: Deatra Robinson M.D.   On: 08/18/2023 23:32   DG Chest Portable 1 View Result Date:  08/18/2023 CLINICAL DATA:  Worsening productive cough. EXAM: PORTABLE CHEST 1 VIEW COMPARISON:  Chest x-ray dated May 01, 2021. FINDINGS: Stable cardiomediastinal silhouette with normal heart size. Normal pulmonary vascularity. Minimal linear scarring at both lung bases again noted. No focal consolidation, pleural effusion, or pneumothorax. No acute osseous abnormality. IMPRESSION: No active disease. Electronically Signed   By: Obie Dredge M.D.   On: 08/18/2023 18:25   DG HIP UNILAT WITH PELVIS 2-3 VIEWS RIGHT Result Date: 08/18/2023 CLINICAL DATA:  Right hip pain and weakness.  Recent falls. EXAM: DG HIP (WITH OR WITHOUT PELVIS) 2-3V RIGHT COMPARISON:  CT abdomen pelvis dated May 01, 2021. FINDINGS: No acute fracture or dislocation. Mild bilateral hip joint space narrowing with small marginal osteophytes. Soft  tissues are unremarkable. IMPRESSION: 1. Mild bilateral hip osteoarthritis. Electronically Signed   By: Obie Dredge M.D.   On: 08/18/2023 18:23   MR BRAIN WO CONTRAST Result Date: 08/18/2023 CLINICAL DATA:  Neuro deficit, acute, stroke suspected. Right lower extremity weakness. EXAM: MRI HEAD WITHOUT CONTRAST TECHNIQUE: Multiplanar, multiecho pulse sequences of the brain and surrounding structures were obtained without intravenous contrast. COMPARISON:  Head CT 07/01/2016 FINDINGS: Brain: There is no evidence of an acute infarct, intracranial hemorrhage, mass, midline shift, or extra-axial fluid collection. Mild generalized cerebral atrophy is within normal for age. Small T2 hyperintensities in the cerebral white matter nonspecific but compatible with mild chronic small vessel ischemic disease. Vascular: Major intracranial vascular flow voids are preserved. Skull and upper cervical spine: Unremarkable bone marrow signal. Sinuses/Orbits: Bilateral cataract extraction. Paranasal sinuses and mastoid air cells are clear. Other: 9 mm T2 hyperintense/cystic focus superficial to the left  parotid gland. IMPRESSION: 1. No acute intracranial abnormality. 2. Mild chronic small vessel ischemic disease. Electronically Signed   By: Sebastian Ache M.D.   On: 08/18/2023 18:08

## 2023-10-10 ENCOUNTER — Encounter: Payer: Self-pay | Admitting: Cardiology

## 2023-10-10 ENCOUNTER — Ambulatory Visit: Payer: Medicare PPO | Attending: Cardiology | Admitting: Cardiology

## 2023-10-10 VITALS — BP 144/80 | HR 65 | Ht 70.5 in | Wt 199.6 lb

## 2023-10-10 DIAGNOSIS — I10 Essential (primary) hypertension: Secondary | ICD-10-CM

## 2023-10-10 DIAGNOSIS — I25119 Atherosclerotic heart disease of native coronary artery with unspecified angina pectoris: Secondary | ICD-10-CM | POA: Diagnosis not present

## 2023-10-10 DIAGNOSIS — E782 Mixed hyperlipidemia: Secondary | ICD-10-CM | POA: Diagnosis not present

## 2023-10-10 NOTE — Progress Notes (Signed)
    Cardiology Office Note  Date: 10/10/2023   ID: David Neyland., DOB 1935-08-13, MRN [id number]  History of Present Illness: David Cronan. is an 89 y.o. male last seen in August 2024.  He is here today with his wife for a follow-up visit. Records indicate hospitalization in December 2024 with COVID-19 and falls, no evidence of acute stroke.  He is doing PT at home, also using an elliptical machine, states that he feels well overall.  Has had some trouble with his memory.  Also reports reflux symptoms.  He does not describe any angina and has had no nitroglycerin  use.  I reviewed his medications.  Current regimen includes Plavix , Cozaar , Lopressor , Pravachol , and as needed nitroglycerin .  I reviewed his ECG from December 2024 as noted below.  Also interval lab work.  Physical Exam: VS:  BP (!) 144/80   Pulse 65   Ht 5' 10.5" (1.791 m)   Wt 199 lb 9.6 oz (90.5 kg)   SpO2 97%   BMI 28.23 kg/m , BMI Body mass index is 28.23 kg/m.  Wt Readings from Last 3 Encounters:  10/10/23 199 lb 9.6 oz (90.5 kg)  09/15/23 195 lb 12.8 oz (88.8 kg)  09/07/23 197 lb 3.2 oz (89.4 kg)    General: Patient appears comfortable at rest. HEENT: Conjunctiva and lids normal. Neck: Supple, no elevated JVP or carotid bruits. Lungs: Clear to auscultation, nonlabored breathing at rest. Cardiac: Regular rate and rhythm, no S3, 1/6 systolic murmur, no pericardial rub. Extremities: No pitting edema.  ECG:  An ECG dated 08/18/2023 was personally reviewed today and demonstrated:  Sinus rhythm with IVCD and nonspecific T wave changes.  Labwork: March 2023: Cholesterol 132, triglycerides 99, HDL 58, LDL 56 07/15/2023: TSH 2.360 08/20/2023: Magnesium 2.0 09/08/2023: ALT 23; AST 27; BUN 16; Creatinine, Ser 1.04; Hemoglobin 14.3; Platelets 406; Potassium 4.0; Sodium 139   Other Studies Reviewed Today:  No interval cardiac testing for review today.  Assessment and Plan:  1.  CAD status post  inferior infarct in 2012 managed with DES x 2 to the ramus intermedius/OM and mid LAD.  Subsequently underwent balloon angioplasty of the OM in 2015.  He reports no angina or nitroglycerin  use.  Recent ECG reviewed.  Plan to continue medical therapy including Plavix , Lopressor , Pravachol , and as needed nitroglycerin .   2.  Primary hypertension.  Also on Cozaar .  Blood pressure mildly elevated today, continue to track in case additional adjustments are necessary.   3.  Mixed hyperlipidemia.  LDL 56 in March 2023.  Continue Pravachol .  Disposition:  Follow up  6 months.  Signed, Gerard Knight, M.D., F.A.C.C. [facility] at [facility]

## 2023-10-10 NOTE — Patient Instructions (Addendum)

## 2023-10-25 ENCOUNTER — Encounter: Payer: Self-pay | Admitting: Internal Medicine

## 2023-11-01 ENCOUNTER — Other Ambulatory Visit: Payer: Self-pay | Admitting: Internal Medicine

## 2023-11-01 DIAGNOSIS — J209 Acute bronchitis, unspecified: Secondary | ICD-10-CM

## 2023-11-15 ENCOUNTER — Ambulatory Visit: Admitting: Internal Medicine

## 2023-11-15 ENCOUNTER — Encounter: Payer: Self-pay | Admitting: Internal Medicine

## 2023-11-15 ENCOUNTER — Other Ambulatory Visit: Payer: Self-pay | Admitting: *Deleted

## 2023-11-15 VITALS — BP 159/72 | HR 52 | Temp 98.8°F | Ht 70.5 in | Wt 204.0 lb

## 2023-11-15 DIAGNOSIS — K219 Gastro-esophageal reflux disease without esophagitis: Secondary | ICD-10-CM

## 2023-11-15 DIAGNOSIS — Z8546 Personal history of malignant neoplasm of prostate: Secondary | ICD-10-CM

## 2023-11-15 DIAGNOSIS — Z85038 Personal history of other malignant neoplasm of large intestine: Secondary | ICD-10-CM | POA: Diagnosis not present

## 2023-11-15 DIAGNOSIS — R32 Unspecified urinary incontinence: Secondary | ICD-10-CM | POA: Diagnosis not present

## 2023-11-15 DIAGNOSIS — F1090 Alcohol use, unspecified, uncomplicated: Secondary | ICD-10-CM

## 2023-11-15 NOTE — Patient Instructions (Signed)
 It was good to see you again today!  From a GI standpoint it sounds like you are doing very well  Continue Protonix or pantoprazole 40 mg twice daily.  Colonoscopy is not needed unless new symptoms develop  Plan to keep your appoint with Dr. Durwin Nora coming up.  I will go ahead and make the referral to Dr. Nolen Mu reassess your prostate and evaluate your urinary incontinence issue  Unless something comes up plan to see you back in 1 year.

## 2023-11-15 NOTE — Progress Notes (Unsigned)
 Primary Care Physician:  Billie Lade, MD Primary Gastroenterologist:  Dr. Jena Gauss  Pre-Procedure History & Physical: HPI:  David Pugh. is a 89 y.o. male here for annual follow-up history of colon cancer - status post right hemicolectomy, history of radiation proctitis status post APC, GERD.  Multitude of comorbidities.  Doing very well from a GI standpoint on twice daily PPI; he is having no bowel symptoms whatsoever  - specifically denies melena or hematochezia.  Recent H&H was normal.  He is aged out of colon cancer surveillance.  Patient complains to me of urinary incontinence onset - relatively acute.  Distant history of prostate cancer.  Has not seen a urologist in years.  His prior urologist retired  Past Medical History:  Diagnosis Date   Abnormality of gait 12/18/2013   Arthritis    Basal cell carcinoma of scalp and skin of neck    Bilateral foot-drop 04/02/2019   Biliary dyskinesia    CAD (coronary artery disease) 2012   a. DES x2 in ramus/OM and mid LAD in 2012  b. balloon angioplaty of OM 2015   CLL (chronic lymphocytic leukemia) (HCC)    Colon cancer (HCC) 2013   Right hemicolectomy, did not tolerate chemotherapy   Colon polyps    tubular adenomas   Diverticula of colon 2010   Essential hypertension    Exposure to Agent Orange    Folic acid deficiency    Gastroparesis 2014   GERD (gastroesophageal reflux disease)    Glaucoma    Heart palpitations 2014   Hiatal hernia 2010   History of anemia as a child    History of kidney stones    Iron deficiency 2013   Myocardial infarction [facility]) 2012   Peripheral neuropathy    Prostate cancer (HCC) 2010   Radiation, surgery   PTSD (post-traumatic stress disorder)    Radiation proctitis    Rosacea     Past Surgical History:  Procedure Laterality Date   CARDIAC CATHETERIZATION  02/13/2011   [facility], [facility] cardiology   CATARACT EXTRACTION     2011   CATARACT EXTRACTION W/PHACO Right 08/06/2013    Procedure: CATARACT EXTRACTION PHACO AND INTRAOCULAR LENS PLACEMENT (IOC);  Surgeon: Gemma Payor, MD;  Location: [facility];  Service: Ophthalmology;  Laterality: Right;  CDE:20.96   CHOLECYSTECTOMY     COLONOSCOPY  02/12/2004   Dr. Jena Gauss- L side diverticula, inflammatory  colon polyps   COLONOSCOPY  01/19/2012   RMR: Prominent changes involving the rectal mucosa consistent with radiation-induced proctitis. Multiple colonic  polyps removed as described above. Sigmoid Diverticulosis/ 1.5 x 2 cm relatively flat ulcerated lesion in cecum/path showed adenocarcinoma of the colon. descending colon polyp with tubular adenoma and one fragment of focal high grade dysplasia.    COLONOSCOPY  08/28/2012   PPI:RJJOACZYS proctitis. Colonic diverticulosis/Ulcerations at the surgical anastomosis  path: ulcerated colonic mucosa with prolapse changes, tubular adenoma.    COLONOSCOPY N/A 08/02/2013   RMR: Colonic polyps -removed as described above. Colonic diverticulosis. Status post reight hemicolectomy. Radiation proctitis- asymptomatic.    COLONOSCOPY N/A 05/09/2015   Procedure: COLONOSCOPY;  Surgeon: Corbin Ade, MD;  Location: [facility];  Service: Endoscopy;  Laterality: N/A;  1200-moved to [address] to notify pt   COLONOSCOPY WITH PROPOFOL N/A 11/10/2017   Procedure: COLONOSCOPY WITH PROPOFOL;  Surgeon: Corbin Ade, MD;  Location: [facility];  Service: Endoscopy;  Laterality: N/A;   CORONARY ANGIOPLASTY  06/17/14   OM1 POBA  CORONARY STENT PLACEMENT  01/2011   2.5x42mm Xience drug-eluting stent in ramus intermedius/OMI vessel, 2.5x74mm Promus Element stent in mid LAD artery   ESOPHAGOGASTRODUODENOSCOPY  10/10/2008   Dr. Maceo Pro hiatal hernia, normal esophagus, normal stomach   ESOPHAGOGASTRODUODENOSCOPY  02/12/2004   Dr. Geni Bers- normal    ESOPHAGOGASTRODUODENOSCOPY N/A 05/09/2015   Procedure: ESOPHAGOGASTRODUODENOSCOPY (EGD);  Surgeon: Corbin Ade, MD;  [facility]: [facility];  Service: Endoscopy;   Laterality: N/A;   ESOPHAGOGASTRODUODENOSCOPY (EGD) WITH PROPOFOL N/A 11/10/2017   Procedure: ESOPHAGOGASTRODUODENOSCOPY (EGD) WITH PROPOFOL;  Surgeon: Corbin Ade, MD;  Location: [facility];  Service: Endoscopy;  Laterality: N/A;  9:45am   GIVENS CAPSULE STUDY N/A 06/03/2015   Procedure: GIVENS CAPSULE STUDY;  Surgeon: Corbin Ade, MD;  Location: [facility];  Service: Endoscopy;  Laterality: N/A;  0700   HOT HEMOSTASIS  11/10/2017   Procedure: HOT HEMOSTASIS (ARGON PLASMA COAGULATION/BICAP);  Surgeon: Corbin Ade, MD;  Location: [facility];  Service: Endoscopy;;  rectum   KNEE SURGERY     left knee    LEFT HEART CATHETERIZATION WITH CORONARY ANGIOGRAM N/A 06/17/2014   Procedure: LEFT HEART CATHETERIZATION WITH CORONARY ANGIOGRAM;  Surgeon: Kathleene Hazel, MD;  Location[facility] CATH LAB;  Service: Cardiovascular;  Laterality: N/A;   PARTIAL COLECTOMY  02/29/2012   Procedure: PARTIAL COLECTOMY;  Surgeon: Ernestene Mention, MD;  Location: [facility];  Service: General;  Laterality: Right;   POLYPECTOMY  11/10/2017   Procedure: POLYPECTOMY;  Surgeon: Corbin Ade, MD;  Location: [facility];  Service: Endoscopy;;  gastric colon   PROSTATECTOMY     TONSILLECTOMY     TRANSURETHRAL RESECTION OF PROSTATE     VASECTOMY      Prior to Admission medications   Medication Sig Start Date End Date Taking? Authorizing Provider  acetaminophen (TYLENOL) 500 MG tablet Take 500 mg by mouth every 6 (six) hours as needed for mild pain (pain score 1-3).   Yes [provider]  Apoaequorin (PREVAGEN EXTRA STRENGTH) 20 MG CAPS Take 1 capsule by mouth daily.   Yes [provider]  B Complex Vitamins (VITAMIN B COMPLEX PO) Take 1 tablet by mouth at bedtime.    Yes [provider]  cholecalciferol (VITAMIN D3) 25 MCG (1000 UNIT) tablet Take 1,000 Units by mouth 2 (two) times daily.   Yes [provider]  citalopram (CELEXA) 10 MG tablet Take 10 mg by mouth daily.    Yes [provider]  clopidogrel (PLAVIX) 75 MG tablet take 1 tablet by mouth once daily. 09/06/23  Yes Jonelle Sidle, MD  folic acid (FOLVITE) 1 MG tablet TAKE (1) TABLET BY MOUTH ONCE DAILY . Patient taking differently: Take 1 mg by mouth daily. 07/13/17  Yes Hubbard Hartshorn, NP  latanoprost (XALATAN) 0.005 % ophthalmic solution 1 DROP IN [facility] EYE AT BEDTIME. 02/16/22  Yes Rankin, Alford Highland, MD  losartan (COZAAR) 50 MG tablet Take 25 mg by mouth 2 (two) times daily.    Yes [provider]  memantine (NAMENDA) 10 MG tablet Take 1 tablet (10 mg total) by mouth 2 (two) times daily. 11/25/22 11/20/23 Yes Camara, Amalia Hailey, MD  metoprolol (LOPRESSOR) 50 MG tablet Take 50 mg by mouth 2 (two) times daily.   Yes [provider]  montelukast (SINGULAIR) 10 MG tablet Take 10 mg by mouth at bedtime.   Yes [provider]  Multiple Vitamin (MULTIVITAMIN WITH MINERALS) TABS tablet Take 1 tablet by mouth daily.  Yes [provider]  nitroGLYCERIN (NITROSTAT) 0.4 MG SL tablet Place 1 tablet (0.4 mg total) under the tongue every 5 (five) minutes x 3 doses as needed for chest pain (if no relief after 3rd dose, proceed to ED or call 911). 04/04/23  Yes Jonelle Sidle, MD  pantoprazole (PROTONIX) 40 MG tablet TAKE (1) TABLET TWICE DAILY. Patient taking differently: Take 40 mg by mouth daily. 01/04/21  Yes Gelene Mink, NP  pravastatin (PRAVACHOL) 40 MG tablet take 1 tablet by mouth at bedtime. 07/04/23  Yes Jonelle Sidle, MD  rivastigmine (EXELON) 1.5 MG capsule Take 1 capsule (1.5 mg total) by mouth 2 (two) times daily. 11/25/22 11/20/23 Yes Windell Norfolk, MD  vitamin B-12 (CYANOCOBALAMIN) 1000 MCG tablet Take 1,000 mcg by mouth daily.   Yes [provider]    Allergies as of 11/15/2023 - Review Complete 11/15/2023  Allergen Reaction Noted   Shellfish allergy Anaphylaxis 01/13/2012   Sulfonamide derivatives Diarrhea and Nausea Only 04/30/2009    Family  History  Problem Relation Age of Onset   Throat cancer Father        dx in his 61s and again in his 31s; pipe and cigar smoker   Neuropathy Brother    Prostate cancer Brother 109   Melanoma Brother        dx in his 45s   Cancer Paternal Uncle        smoking related cancer   Heart attack Paternal Grandfather    Stomach cancer Cousin 52   Colon cancer Neg Hx     Social History   Socioeconomic History   Marital status: Married    Spouse name: Not on file   Number of children: 3   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   Highest education level: Not on file  Occupational History   Occupation: Retired    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  Tobacco Use   Smoking status: Never   Smokeless tobacco: Never  Vaping Use   Vaping status: Never Used  Substance and Sexual Activity   Alcohol use: Yes    Comment: 1 drink every 6 months   Drug use: No   Sexual activity: Not Currently  Other Topics Concern   Not on file  Social History Narrative   Right handed   Caffeine~1 cup per day    Lives at home with wife    Social Drivers of Health   Financial Resource Strain: Low Risk  (12/15/2022)   [facility], [facility]   Overall Financial Resource Strain (CARDIA)    Difficulty of Paying Living Expenses: Not hard at all  Food Insecurity: No Food Insecurity (08/18/2023)   Hunger Vital Sign    Worried About Running Out of Food in the Last Year: Never true    Ran Out of Food in the Last Year: Never true  Transportation Needs: No Transportation Needs (08/18/2023)   PRAPARE - Administrator, Civil Service (Medical): No    Lack of Transportation (Non-Medical): No  Physical Activity: Inactive (07/14/2020)   Exercise Vital Sign    Days of Exercise per Week: 0 days    Minutes of Exercise per Session: 0 min  Stress: No Stress Concern Present (12/15/2022)   Received from [facility], [facility] - Occupational Stress Questionnaire     Feeling of Stress : Not at all  Social Connections: Unknown (01/11/2022)   Received from [facility], [facility]   Social Network    Social Network: Not on file  Intimate Partner Violence: Not At Risk (08/18/2023)   Humiliation, Afraid, Rape, and Kick questionnaire    Fear of Current or Ex-Partner: No    Emotionally Abused: No    Physically Abused: No    Sexually Abused: No    Review of Systems: See HPI, otherwise negative ROS  Physical Exam: BP (!) 159/72 (BP Location: Left Arm, Patient Position: Sitting, Cuff Size: Normal)   Pulse (!) 52   Temp 98.8 F (37.1 C) (Oral)   Ht 5' 10.5" (1.791 m)   Wt 204 lb (92.5 kg)   SpO2 98%   BMI 28.86 kg/m  General:   Alert,  Well-developed, well-nourished, pleasant and cooperative in NAD Skin:  Intact without significant lesions or rashes. Neck:  Supple; no masses or thyromegaly. No significant cervical adenopathy. Lungs:  Clear throughout to auscultation.   No wheezes, crackles, or rhonchi. No acute distress. Heart:  Regular rate and rhythm; no murmurs, clicks, rubs,  or gallops. Abdomen: Non-distended, normal bowel sounds.  Soft and nontender without appreciable mass or hepatosplenomegaly.   Impression/Plan: Very pleasant 89 year old [place] [employer] veteran here for follow-up.  GERD well-controlled with does require twice daily PPI therapy.  No bowel issues distant history of colon cancer.  Felt to have been cured with resection.  No ongoing GI issues at this time.  New symptoms of urinary incontinence patient with a history of prostate cancer no urological follow-up in years.  Recommendations:   Continue Protonix or pantoprazole 40 mg twice daily.  Colonoscopy is not needed unless new symptoms develop  Plan to keep your appoint with Dr. Durwin Nora coming up.  I will go ahead and make the referral to Dr. Candida Peeling to reassess  prostate and evaluate your urinary incontinence issue  Unless something comes up plan to see you back in 1  year.   Notice: This dictation was prepared with Dragon dictation along with smaller phrase technology. Any transcriptional errors that result from this process are unintentional and may not be corrected upon review.

## 2023-11-15 NOTE — Progress Notes (Unsigned)
 f

## 2023-11-23 ENCOUNTER — Ambulatory Visit (INDEPENDENT_AMBULATORY_CARE_PROVIDER_SITE_OTHER): Payer: TRICARE For Life (TFL) | Admitting: Neurology

## 2023-11-23 ENCOUNTER — Encounter: Payer: Self-pay | Admitting: Neurology

## 2023-11-23 VITALS — BP 136/82 | HR 88 | Ht 70.5 in | Wt 194.0 lb

## 2023-11-23 DIAGNOSIS — G3184 Mild cognitive impairment, so stated: Secondary | ICD-10-CM

## 2023-11-23 MED ORDER — MEMANTINE HCL 10 MG PO TABS
10.0000 mg | ORAL_TABLET | Freq: Two times a day (BID) | ORAL | 3 refills | Status: AC
Start: 2023-11-23 — End: 2024-11-17

## 2023-11-23 MED ORDER — RIVASTIGMINE TARTRATE 1.5 MG PO CAPS
1.5000 mg | ORAL_CAPSULE | Freq: Two times a day (BID) | ORAL | 3 refills | Status: AC
Start: 2023-11-23 — End: 2024-11-17
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
## 2023-11-23 NOTE — Patient Instructions (Signed)
 Continue with Namenda 10 mg twice daily Continue with Exelon 1.5 mg twice daily Continue your other medications Continue physical activity Continue follow-up PCP and return as needed.

## 2023-12-02 ENCOUNTER — Other Ambulatory Visit: Payer: Self-pay

## 2023-12-02 MED ORDER — PRAVASTATIN SODIUM 40 MG PO TABS
40.0000 mg | ORAL_TABLET | Freq: Every day | ORAL | 3 refills | Status: AC
Start: 2023-12-02 — End: ?

## 2023-12-28 ENCOUNTER — Ambulatory Visit: Admitting: Urology

## 2023-12-28 ENCOUNTER — Encounter: Payer: Self-pay | Admitting: Urology

## 2023-12-28 VITALS — BP 136/73 | HR 54

## 2023-12-28 DIAGNOSIS — C61 Malignant neoplasm of prostate: Secondary | ICD-10-CM | POA: Diagnosis not present

## 2023-12-28 DIAGNOSIS — R32 Unspecified urinary incontinence: Secondary | ICD-10-CM | POA: Diagnosis not present

## 2023-12-28 LAB — URINALYSIS, ROUTINE W REFLEX MICROSCOPIC
Bilirubin, UA: NEGATIVE
Glucose, UA: NEGATIVE
Ketones, UA: NEGATIVE
Leukocytes,UA: NEGATIVE
Nitrite, UA: NEGATIVE
Protein,UA: NEGATIVE
RBC, UA: NEGATIVE
Specific Gravity, UA: 1.015 (ref 1.005–1.030)
Urobilinogen, Ur: 0.2 mg/dL (ref 0.2–1.0)
pH, UA: 6 (ref 5.0–7.5)

## 2023-12-28 LAB — BLADDER SCAN AMB NON-IMAGING: Scan Result: 25

## 2023-12-28 MED ORDER — GEMTESA 75 MG PO TABS: 1.0000 | ORAL_TABLET | Freq: Every day | ORAL | Status: DC

## 2023-12-28 NOTE — Progress Notes (Signed)
 12/28/2023 1:45 PM   Dierdre Foyer. 16-May-1935 [id number]  Referring provider: Suzette Espy, MD [address] [place],  [place] [address]  Urinary frequency   HPI: Mr Sieg is a [phone] here for evaluation of urinary frequency. For the past 2 months he has had increased urinary frequency and urgency. He has occasional urge incontinence and wears 1 pad per day which is occasionally wet. He has a hx of prostate cancer and had radiation therapy. He has a hx cystoscopy with fulgeration with Dr. Del Favia 3-4 years ago. PSA 0.15.  He had an MRi in 07/2023 which showed impingement in L4-L5./ No hx of glaucoma.    PMH: Past Medical History:  Diagnosis Date   Abnormality of gait 12/18/2013   Arthritis    Basal cell carcinoma of scalp and skin of neck    Bilateral foot-drop 04/02/2019   Biliary dyskinesia    CAD (coronary artery disease) 2012   a. DES x2 in ramus/OM and mid LAD in 2012  b. balloon angioplaty of OM 2015   CLL (chronic lymphocytic leukemia) (HCC)    Colon cancer (HCC) 2013   Right hemicolectomy, did not tolerate chemotherapy   Colon polyps    tubular adenomas   Diverticula of colon 2010   Essential hypertension    Exposure to Agent Orange    Folic acid  deficiency    Gastroparesis 2014   GERD (gastroesophageal reflux disease)    Glaucoma    Heart palpitations 2014   Hiatal hernia 2010   History of anemia as a child    History of kidney stones    Iron  deficiency 2013   Myocardial infarction [facility]) 2012   Peripheral neuropathy    Prostate cancer (HCC) 2010   Radiation, surgery   PTSD (post-traumatic stress disorder)    Radiation proctitis    Rosacea     Surgical History: Past Surgical History:  Procedure Laterality Date   CARDIAC CATHETERIZATION  02/13/2011   [facility] cardiology   CATARACT EXTRACTION     2011   CATARACT EXTRACTION W/PHACO Right 08/06/2013   Procedure: CATARACT EXTRACTION PHACO AND INTRAOCULAR LENS PLACEMENT (IOC);  Surgeon:  Anner Kill, MD;  Location: [facility];  Service: Ophthalmology;  Laterality: Right;  CDE:20.96   CHOLECYSTECTOMY     COLONOSCOPY  02/12/2004   Dr. Riley Cheadle- L side diverticula, inflammatory  colon polyps   COLONOSCOPY  01/19/2012   RMR: Prominent changes involving the rectal mucosa consistent with radiation-induced proctitis. Multiple colonic  polyps removed as described above. Sigmoid Diverticulosis/ 1.5 x 2 cm relatively flat ulcerated lesion in cecum/path showed adenocarcinoma of the colon. descending colon polyp with tubular adenoma and one fragment of focal high grade dysplasia.    COLONOSCOPY  08/28/2012   VHQ:IONGEXBMW proctitis. Colonic diverticulosis/Ulcerations at the surgical anastomosis  path: ulcerated colonic mucosa with prolapse changes, tubular adenoma.    COLONOSCOPY N/A 08/02/2013   RMR: Colonic polyps -removed as described above. Colonic diverticulosis. Status post reight hemicolectomy. Radiation proctitis- asymptomatic.    COLONOSCOPY N/A 05/09/2015   Procedure: COLONOSCOPY;  Surgeon: Suzette Espy, MD;  Location: [facility];  Service: Endoscopy;  Laterality: N/A;  1200-moved to [address] to notify pt   COLONOSCOPY WITH PROPOFOL  N/A 11/10/2017   Procedure: COLONOSCOPY WITH PROPOFOL ;  Surgeon: Suzette Espy, MD;  [facility]: [facility];  Service: Endoscopy;  Laterality: N/A;   CORONARY ANGIOPLASTY  06/17/14   OM1 POBA   CORONARY STENT PLACEMENT  01/2011   2.5x46mm  Xience drug-eluting stent in ramus intermedius/OMI vessel, 2.5x47mm Promus Element stent in mid LAD artery   ESOPHAGOGASTRODUODENOSCOPY  10/10/2008   Dr. Furman Jobs hiatal hernia, normal esophagus, normal stomach   ESOPHAGOGASTRODUODENOSCOPY  02/12/2004   Dr. Jacqulin Maus- normal    ESOPHAGOGASTRODUODENOSCOPY N/A 05/09/2015   Procedure: ESOPHAGOGASTRODUODENOSCOPY (EGD);  Surgeon: Suzette Espy, MD;  [facility]: [facility];  Service: Endoscopy;  Laterality: N/A;   ESOPHAGOGASTRODUODENOSCOPY (EGD) WITH PROPOFOL  N/A 11/10/2017    Procedure: ESOPHAGOGASTRODUODENOSCOPY (EGD) WITH PROPOFOL ;  Surgeon: Suzette Espy, MD;  [facility]: [facility];  Service: Endoscopy;  Laterality: N/A;  9:45am   GIVENS CAPSULE STUDY N/A 06/03/2015   Procedure: GIVENS CAPSULE STUDY;  Surgeon: Suzette Espy, MD;  [facility]: [facility];  Service: Endoscopy;  Laterality: N/A;  0700   HOT HEMOSTASIS  11/10/2017   Procedure: HOT HEMOSTASIS (ARGON PLASMA COAGULATION/BICAP);  Surgeon: Suzette Espy, MD;  Location: [facility];  Service: Endoscopy;;  rectum   KNEE SURGERY     left knee    LEFT HEART CATHETERIZATION WITH CORONARY ANGIOGRAM N/A 06/17/2014   Procedure: LEFT HEART CATHETERIZATION WITH CORONARY ANGIOGRAM;  Surgeon: Odie Benne, MD;  Location: [facility];  Service: Cardiovascular;  Laterality: N/A;   PARTIAL COLECTOMY  02/29/2012   Procedure: PARTIAL COLECTOMY;  Surgeon: Levert Ready, MD;  Location: [facility];  Service: General;  Laterality: Right;   POLYPECTOMY  11/10/2017   Procedure: POLYPECTOMY;  Surgeon: Suzette Espy, MD;  Location: [facility];  Service: Endoscopy;;  gastric colon   PROSTATECTOMY     TONSILLECTOMY     TRANSURETHRAL RESECTION OF PROSTATE     VASECTOMY      Home Medications:  Allergies as of 12/28/2023       Reactions   Shellfish Allergy Anaphylaxis   Sulfonamide Derivatives Diarrhea, Nausea Only        Medication List        Accurate as of December 28, 2023  1:45 PM. If you have any questions, ask your nurse or doctor.          acetaminophen  500 MG tablet Commonly known as: TYLENOL  Take 500 mg by mouth every 6 (six) hours as needed for mild pain (pain score 1-3).   cholecalciferol  25 MCG (1000 UNIT) tablet Commonly known as: VITAMIN D3 Take 1,000 Units by mouth 2 (two) times daily.   citalopram  10 MG tablet Commonly known as: CELEXA  Take 10 mg by mouth daily.   clopidogrel  75 MG tablet Commonly known as: PLAVIX  take 1 tablet by mouth once daily.   cyanocobalamin  1000  MCG tablet Commonly known as: VITAMIN B12 Take 1,000 mcg by mouth daily.   folic acid  1 MG tablet Commonly known as: FOLVITE  TAKE (1) TABLET BY MOUTH ONCE DAILY . What changed: See the new instructions.   latanoprost  0.005 % ophthalmic solution Commonly known as: XALATAN  1 DROP IN EACH EYE AT BEDTIME.   losartan  50 MG tablet Commonly known as: COZAAR  Take 25 mg by mouth 2 (two) times daily.   memantine  10 MG tablet Commonly known as: Namenda  Take 1 tablet (10 mg total) by mouth 2 (two) times daily.   metoprolol  tartrate 50 MG tablet Commonly known as: LOPRESSOR  Take 50 mg by mouth 2 (two) times daily.   montelukast  10 MG tablet Commonly known as: SINGULAIR  Take 10 mg by mouth at bedtime.   multivitamin with minerals Tabs tablet Take 1 tablet by mouth daily.   nitroGLYCERIN  0.4 MG SL tablet  Commonly known as: NITROSTAT  Place 1 tablet (0.4 mg total) under the tongue every 5 (five) minutes x 3 doses as needed for chest pain (if no relief after 3rd dose, proceed to ED or call 911).   pantoprazole  40 MG tablet Commonly known as: PROTONIX  TAKE (1) TABLET TWICE DAILY. What changed: See the new instructions.   pravastatin  40 MG tablet Commonly known as: PRAVACHOL  Take 1 tablet (40 mg total) by mouth at bedtime.   Prevagen Extra Strength 20 MG Caps Generic drug: Apoaequorin Take 1 capsule by mouth daily.   rivastigmine  1.5 MG capsule Commonly known as: EXELON  Take 1 capsule (1.5 mg total) by mouth 2 (two) times daily.   VITAMIN B COMPLEX  PO Take 1 tablet by mouth at bedtime.        Allergies:  Allergies  Allergen Reactions   Shellfish Allergy Anaphylaxis   Sulfonamide Derivatives Diarrhea and Nausea Only    Family History: Family History  Problem Relation Age of Onset   Throat cancer Father        dx in his 37s and again in his 108s; pipe and cigar smoker   Neuropathy Brother    Prostate cancer Brother 83   Melanoma Brother        dx in his 55s    Cancer Paternal Uncle        smoking related cancer   Heart attack Paternal Grandfather    Stomach cancer Cousin 73   Colon cancer Neg Hx     Social History:  reports that he has never smoked. He has never used smokeless tobacco. He reports current alcohol use. He reports that he does not use drugs.  ROS: All other review of systems were reviewed and are negative except what is noted above in HPI  Physical Exam: BP 136/73   Pulse (!) 54   Constitutional:  Alert and oriented, No acute distress. HEENT: [place] AT, moist mucus membranes.  Trachea midline, no masses. Cardiovascular: No clubbing, cyanosis, or edema. Respiratory: Normal respiratory effort, no increased work of breathing. GI: Abdomen is soft, nontender, nondistended, no abdominal masses GU: No CVA tenderness.  Lymph: No cervical or inguinal lymphadenopathy. Skin: No rashes, bruises or suspicious lesions. Neurologic: Grossly intact, no focal deficits, moving all 4 extremities. Psychiatric: Normal mood and affect.  Laboratory Data: Lab Results  Component Value Date   WBC 14.5 (H) 09/08/2023   HGB 14.3 09/08/2023   HCT 44.6 09/08/2023   MCV 95.5 09/08/2023   PLT 406 (H) 09/08/2023    Lab Results  Component Value Date   CREATININE 1.04 09/08/2023    Lab Results  Component Value Date   PSA 0.26 12/13/2014   PSA 0.34 08/20/2014    No results found for: "TESTOSTERONE"  Lab Results  Component Value Date   HGBA1C 5.9 (H) 11/25/2013    Urinalysis    Component Value Date/Time   COLORURINE YELLOW 08/18/2023 1840   APPEARANCEUR CLEAR 08/18/2023 1840   LABSPEC 1.021 08/18/2023 1840   PHURINE 5.0 08/18/2023 1840   GLUCOSEU NEGATIVE 08/18/2023 1840   HGBUR SMALL (A) 08/18/2023 1840   BILIRUBINUR NEGATIVE 08/18/2023 1840   KETONESUR 5 (A) 08/18/2023 1840   PROTEINUR 30 (A) 08/18/2023 1840   UROBILINOGEN 0.2 11/24/2013 1730   NITRITE NEGATIVE 08/18/2023 1840   LEUKOCYTESUR NEGATIVE 08/18/2023 1840    Lab  Results  Component Value Date   BACTERIA RARE (A) 08/18/2023    Pertinent Imaging:  No results found for this or any previous visit.  No results found for this or any previous visit.  No results found for this or any previous visit.  No results found for this or any previous visit.  No results found for this or any previous visit.  No results found for this or any previous visit.  No results found for this or any previous visit.  Results for orders placed during the hospital encounter of 08/27/15  CT RENAL STONE STUDY  Narrative CLINICAL DATA:  Vomiting and diarrhea for 3 days with intermittent abdominal pain  EXAM: CT ABDOMEN AND PELVIS WITHOUT CONTRAST  TECHNIQUE: Multidetector CT imaging of the abdomen and pelvis was performed following the standard protocol without IV contrast.  COMPARISON:  06/13/2015  FINDINGS: Lung bases show no focal infiltrate or sizable effusion. Minimal scarring is noted in the right lung base stable from the prior exam. The gallbladder is been surgically removed. The liver, spleen, adrenal glands and pancreas are within normal limits. No renal calculi or urinary tract obstructive changes are noted. Hypodensity is noted within the lower pole of the left kidney consistent with a focal cyst.  Mild aortoiliac calcifications are noted without aneurysmal dilatation. Changes consistent with right colectomy are again seen. No obstructive changes are noted.  The bladder is partially distended. No free pelvic fluid is noted. No pelvic mass lesion or lymphadenopathy is noted. Minimal diverticular change is noted without evidence of diverticulitis. Mild aortoiliac calcifications are seen. The osseous structures are within normal limits.  IMPRESSION: Chronic changes as described above. No significant interval change from the prior exam. No acute abnormality noted.   Electronically Signed By: Violeta Grey M.D. On: 08/27/2015  15:06   Assessment & Plan:    1. Urinary incontinence, unspecified type (Primary) We will trial Gemtesa  75mg  daily.  - Urinalysis, Routine w reflex microscopic - BLADDER SCAN AMB NON-IMAGING  2. Prostate cancer (HCC) PSA in 1 year   No follow-ups on file.  Johnie Nailer, MD  [facility] [place]

## 2023-12-28 NOTE — Progress Notes (Signed)
post void residual =25

## 2023-12-28 NOTE — Patient Instructions (Signed)

## 2024-01-05 ENCOUNTER — Ambulatory Visit: Payer: Medicare PPO | Admitting: Internal Medicine

## 2024-01-05 ENCOUNTER — Encounter: Payer: Self-pay | Admitting: Internal Medicine

## 2024-01-05 VITALS — BP 135/73 | HR 56 | Ht 70.5 in | Wt 202.4 lb

## 2024-01-05 DIAGNOSIS — J302 Other seasonal allergic rhinitis: Secondary | ICD-10-CM | POA: Diagnosis not present

## 2024-01-05 DIAGNOSIS — F4310 Post-traumatic stress disorder, unspecified: Secondary | ICD-10-CM

## 2024-01-05 DIAGNOSIS — I10 Essential (primary) hypertension: Secondary | ICD-10-CM

## 2024-01-05 DIAGNOSIS — I2510 Atherosclerotic heart disease of native coronary artery without angina pectoris: Secondary | ICD-10-CM | POA: Diagnosis not present

## 2024-01-05 DIAGNOSIS — G3184 Mild cognitive impairment, so stated: Secondary | ICD-10-CM

## 2024-01-05 DIAGNOSIS — R413 Other amnesia: Secondary | ICD-10-CM

## 2024-01-05 DIAGNOSIS — K219 Gastro-esophageal reflux disease without esophagitis: Secondary | ICD-10-CM

## 2024-01-05 MED ORDER — AZELASTINE-FLUTICASONE 137-50 MCG/ACT NA SUSP
1.0000 | Freq: Two times a day (BID) | NASAL | 1 refills | Status: AC
Start: 2024-01-05 — End: ?

## 2024-01-05 NOTE — Assessment & Plan Note (Signed)
 Symptoms remain adequately controlled with pantoprazole .  Recently seen by gastroenterology for follow-up.

## 2024-01-05 NOTE — Assessment & Plan Note (Signed)
 Remains adequately controlled on current antihypertensive regimen.  No medication changes are indicated today.

## 2024-01-05 NOTE — Assessment & Plan Note (Addendum)
 Mild cognitive impairment attributed to Alzheimer disease based on ATN profile from March 2024.  He remains on Namenda  and Exelon .  Recently seen by neurology for follow-up and was told that he had "30 months".  He questions this diagnosis based on his high functioning and would like a second opinion.  Neurology referral placed to [facility] at his request today.

## 2024-01-05 NOTE — Assessment & Plan Note (Signed)
 Denies recent chest pain.  Recently seen by cardiology for follow-up.  He remains on Plavix , pravastatin , and metoprolol .

## 2024-01-05 NOTE — Patient Instructions (Signed)
 It was a pleasure to see you today.  Thank you for giving us  the opportunity to be involved in your care.  Below is a brief recap of your visit and next steps.  We will plan to see you again in 6 months.  Summary No medication changes today. Neurology referral placed for second opinion. Nasal spray refilled Follow up in 6 months

## 2024-01-05 NOTE — Assessment & Plan Note (Signed)
 Stable.  Well-controlled with Celexa  10 mg daily.

## 2024-01-05 NOTE — Assessment & Plan Note (Signed)
 Fluticasone -azelastine  nasal spray refilled today

## 2024-01-05 NOTE — Progress Notes (Signed)
 Established Patient Office Visit  Subjective   Patient ID: David Mumma., male    DOB: 1935-08-21  Age: 89 y.o. MRN: [id number]  Chief Complaint  Patient presents with   Care Management    Six month follow up wants to discuss recent diagnosis from neurologist    Knee Pain    Right knee pain    post nasal drip    Post nasal drip    David Pugh returns to care today for follow-up.  He was last evaluated by me in November 2024 as a new patient presenting to establish care.  Azelastine -fluticasone  nasal spray was prescribed at that time in the setting of allergic rhinitis, a referral was placed to orthopedic surgery in the setting of chronic right knee pain, and 16-month follow-up was arranged.  In the interim, he was admitted to [facility] 12/19 - 12/21 in the setting of multiple falls and weakness that were attributed to COVID-19 and dehydration.  Seen at [facility] by David Pugh for hospital follow-up on 1/8.  In the interim he has also been seen by oncology, cardiology, gastroenterology, and neurology for follow-up.  He has also been evaluated by urology 4/30 in the setting of urinary incontinence, Gemtesa  prescribed.  There have otherwise been no acute interval events.  David Pugh reports feeling fairly well today.  He endorses sinus congestion and requests a refill of fluticasone -azelastine  nasal spray, which was effective previously.  His acute concerns today is requesting a neurology referral for a second opinion regarding cognitive impairment.  He was recently told that he had mild cognitive impairment secondary to Alzheimer disease and had roughly "30 months" remaining.  He is concerned about this diagnosis given that he is highly functioning and would like to be evaluated by a different neurologist.   Past Medical History:  Diagnosis Date   Abnormality of gait 12/18/2013   Arthritis    Basal cell carcinoma of scalp and skin of neck    Bilateral foot-drop 04/02/2019   Biliary  dyskinesia    CAD (coronary artery disease) 2012   a. DES x2 in ramus/OM and mid LAD in 2012  b. balloon angioplaty of OM 2015   CLL (chronic lymphocytic leukemia) (HCC)    Colon cancer (HCC) 2013   Right hemicolectomy, did not tolerate chemotherapy   Colon polyps    tubular adenomas   Diverticula of colon 2010   Essential hypertension    Exposure to Agent Orange    Folic acid  deficiency    Gastroparesis 2014   GERD (gastroesophageal reflux disease)    Glaucoma    Heart palpitations 2014   Hiatal hernia 2010   History of anemia as a child    History of kidney stones    Iron  deficiency 2013   Myocardial infarction [facility]) 2012   Peripheral neuropathy    Prostate cancer (HCC) 2010   Radiation, surgery   PTSD (post-traumatic stress disorder)    Radiation proctitis    Rosacea    Past Surgical History:  Procedure Laterality Date   CARDIAC CATHETERIZATION  02/13/2011   [facility] cardiology   CATARACT EXTRACTION     2011   CATARACT EXTRACTION W/PHACO Right 08/06/2013   Procedure: CATARACT EXTRACTION PHACO AND INTRAOCULAR LENS PLACEMENT (IOC);  Surgeon: Anner Kill, MD;  Location: [facility];  Service: Ophthalmology;  Laterality: Right;  CDE:20.96   CHOLECYSTECTOMY     COLONOSCOPY  02/12/2004   Dr. Riley Cheadle- L side diverticula, inflammatory  colon polyps  COLONOSCOPY  01/19/2012   RMR: Prominent changes involving the rectal mucosa consistent with radiation-induced proctitis. Multiple colonic  polyps removed as described above. Sigmoid Diverticulosis/ 1.5 x 2 cm relatively flat ulcerated lesion in cecum/path showed adenocarcinoma of the colon. descending colon polyp with tubular adenoma and one fragment of focal high grade dysplasia.    COLONOSCOPY  08/28/2012   WUJ:[id number] proctitis. Colonic diverticulosis/Ulcerations at the surgical anastomosis  path: ulcerated colonic mucosa with prolapse changes, tubular adenoma.    COLONOSCOPY N/A 08/02/2013   RMR: Colonic polyps -removed as  described above. Colonic diverticulosis. Status post reight hemicolectomy. Radiation proctitis- asymptomatic.    COLONOSCOPY N/A 05/09/2015   Procedure: COLONOSCOPY;  Surgeon: Suzette Espy, MD;  Location: [facility];  Service: Endoscopy;  Laterality: N/A;  1200-moved to [address]5 [address] to notify pt   COLONOSCOPY WITH PROPOFOL  N/A 11/10/2017   Procedure: COLONOSCOPY WITH PROPOFOL ;  Surgeon: Suzette Espy, MD;  Location: [facility];  Service: Endoscopy;  Laterality: N/A;   CORONARY ANGIOPLASTY  06/17/14   OM1 POBA   CORONARY STENT PLACEMENT  01/2011   2.5x51mm Xience drug-eluting stent in ramus intermedius/OMI vessel, 2.5x27mm Promus Element stent in mid LAD artery   ESOPHAGOGASTRODUODENOSCOPY  10/10/2008   Dr. Furman Jobs hiatal hernia, normal esophagus, normal stomach   ESOPHAGOGASTRODUODENOSCOPY  02/12/2004   Dr. Jacqulin Maus- normal    ESOPHAGOGASTRODUODENOSCOPY N/A 05/09/2015   Procedure: ESOPHAGOGASTRODUODENOSCOPY (EGD);  Surgeon: Suzette Espy, MD;  Location: [facility];  Service: Endoscopy;  Laterality: N/A;   ESOPHAGOGASTRODUODENOSCOPY (EGD) WITH PROPOFOL  N/A 11/10/2017   Procedure: ESOPHAGOGASTRODUODENOSCOPY (EGD) WITH PROPOFOL ;  Surgeon: Suzette Espy, MD;  Location: [facility];  Service: Endoscopy;  Laterality: N/A;  9:45am   GIVENS CAPSULE STUDY N/A 06/03/2015   Procedure: GIVENS CAPSULE STUDY;  Surgeon: Suzette Espy, MD;  Location: [facility];  Service: Endoscopy;  Laterality: N/A;  0700   HOT HEMOSTASIS  11/10/2017   Procedure: HOT HEMOSTASIS (ARGON PLASMA COAGULATION/BICAP);  Surgeon: Suzette Espy, MD;  Location: [facility];  Service: Endoscopy;;  rectum   KNEE SURGERY     left knee    LEFT HEART CATHETERIZATION WITH CORONARY ANGIOGRAM N/A 06/17/2014   Procedure: LEFT HEART CATHETERIZATION WITH CORONARY ANGIOGRAM;  Surgeon: Odie Benne, MD;  Location: [facility] CATH LAB;  Service: Cardiovascular;  Laterality: N/A;   PARTIAL COLECTOMY  02/29/2012   Procedure: PARTIAL  COLECTOMY;  Surgeon: Levert Ready, MD;  Location: [facility];  Service: General;  Laterality: Right;   POLYPECTOMY  11/10/2017   Procedure: POLYPECTOMY;  Surgeon: Suzette Espy, MD;  Location: [facility];  Service: Endoscopy;;  gastric colon   PROSTATECTOMY     TONSILLECTOMY     TRANSURETHRAL RESECTION OF PROSTATE     VASECTOMY     Social History   Tobacco Use   Smoking status: Never   Smokeless tobacco: Never  Vaping Use   Vaping status: Never Used  Substance Use Topics   Alcohol use: Yes    Comment: 1 drink every 6 months   Drug use: No   Family History  Problem Relation Age of Onset   Throat cancer Father        dx in his 72s and again in his 41s; pipe and cigar smoker   Neuropathy Brother    Prostate cancer Brother 68   Melanoma Brother        dx in his 29s   Cancer Paternal Uncle  smoking related cancer   Heart attack Paternal Grandfather    Stomach cancer Cousin 14   Colon cancer Neg Hx    Allergies  Allergen Reactions   Shellfish Allergy Anaphylaxis   Sulfonamide Derivatives Diarrhea and Nausea Only   Review of Systems  Constitutional:  Negative for chills and fever.  HENT:  Positive for congestion. Negative for sore throat.   Respiratory:  Negative for cough and shortness of breath.   Cardiovascular:  Negative for chest pain, palpitations and leg swelling.  Gastrointestinal:  Negative for abdominal pain, blood in stool, constipation, diarrhea, nausea and vomiting.  Genitourinary:  Negative for dysuria and hematuria.  Musculoskeletal:  Negative for myalgias.  Skin:  Negative for itching and rash.  Neurological:  Negative for dizziness and headaches.  Psychiatric/Behavioral:  Negative for depression and suicidal ideas.      Objective:     BP 135/73   Pulse (!) 56   Ht 5' 10.5" (1.791 m)   Wt 202 lb 6.4 oz (91.8 kg)   SpO2 97%   BMI 28.63 kg/m  BP Readings from Last 3 Encounters:  01/05/24 135/73  12/28/23 136/73  11/23/23 136/82    Physical Exam Vitals reviewed.  Constitutional:      General: He is not in acute distress.    Appearance: Normal appearance. He is not ill-appearing.  HENT:     Head: Normocephalic and atraumatic.     Right Ear: External ear normal.     Left Ear: External ear normal.     Nose: Nose normal. No congestion or rhinorrhea.     Mouth/Throat:     Mouth: Mucous membranes are moist.     Pharynx: Oropharynx is clear.  Eyes:     General: No scleral icterus.    Extraocular Movements: Extraocular movements intact.     Conjunctiva/sclera: Conjunctivae normal.     Pupils: Pupils are equal, round, and reactive to light.  Cardiovascular:     Rate and Rhythm: Normal rate and regular rhythm.     Pulses: Normal pulses.     Heart sounds: Normal heart sounds. No murmur heard. Pulmonary:     Effort: Pulmonary effort is normal.     Breath sounds: Normal breath sounds. No wheezing, rhonchi or rales.  Abdominal:     General: Abdomen is flat. Bowel sounds are normal. There is no distension.     Palpations: Abdomen is soft.     Tenderness: There is no abdominal tenderness.  Musculoskeletal:        General: No swelling or deformity. Normal range of motion.     Cervical back: Normal range of motion.  Skin:    General: Skin is warm and dry.     Capillary Refill: Capillary refill takes less than 2 seconds.  Neurological:     General: No focal deficit present.     Mental Status: He is alert and oriented to person, place, and time.     Motor: No weakness.     Gait: Gait abnormal (Ambulates with cane).  Psychiatric:        Mood and Affect: Mood normal.        Behavior: Behavior normal.        Thought Content: Thought content normal.   Last CBC Lab Results  Component Value Date   WBC 14.5 (H) 09/08/2023   HGB 14.3 09/08/2023   HCT 44.6 09/08/2023   MCV 95.5 09/08/2023   MCH 30.6 09/08/2023   RDW 12.9 09/08/2023   PLT 406 (  H) 09/08/2023   Last metabolic panel Lab Results  Component Value  Date   GLUCOSE 107 (H) 09/08/2023   NA 139 09/08/2023   K 4.0 09/08/2023   CL 104 09/08/2023   CO2 26 09/08/2023   BUN 16 09/08/2023   CREATININE 1.04 09/08/2023   GFRNONAA >60 09/08/2023   CALCIUM 9.0 09/08/2023   PROT 7.1 09/08/2023   ALBUMIN 3.8 09/08/2023   LABGLOB 2.3 07/15/2023   BILITOT 0.9 09/08/2023   ALKPHOS 70 09/08/2023   AST 27 09/08/2023   ALT 23 09/08/2023   ANIONGAP 9 09/08/2023   Last lipids Lab Results  Component Value Date   CHOL 133 04/27/2017   HDL 63 04/27/2017   LDLCALC 49 04/27/2017   TRIG 106 04/27/2017   CHOLHDL 2.1 04/27/2017   Last hemoglobin A1c Lab Results  Component Value Date   HGBA1C 5.9 (H) 11/25/2013   Last thyroid functions Lab Results  Component Value Date   TSH 2.360 07/15/2023   Last vitamin D  Lab Results  Component Value Date   VD25OH 54.8 07/15/2023   Last vitamin B12 and Folate Lab Results  Component Value Date   VITAMINB12 1,536 (H) 07/15/2023   FOLATE >20.0 07/15/2023     Assessment & Plan:   Problem List Items Addressed This Visit       CAD (coronary artery disease) (Chronic)   Denies recent chest pain.  Recently seen by cardiology for follow-up.  He remains on Plavix , pravastatin , and metoprolol .      HTN (hypertension) (Chronic)   Remains adequately controlled on current antihypertensive regimen.  No medication changes are indicated today.      Seasonal allergic rhinitis - Primary   Fluticasone -azelastine  nasal spray refilled today      GERD   Symptoms remain adequately controlled with pantoprazole .  Recently seen by gastroenterology for follow-up.      PTSD (post-traumatic stress disorder)   Stable.  Well-controlled with Celexa  10 mg daily.      Memory impairment   Mild cognitive impairment attributed to Alzheimer disease based on ATN profile from March 2024.  He remains on Namenda  and Exelon .  Recently seen by neurology for follow-up and was told that he had "30 months".  He questions this  diagnosis based on his high functioning and would like a second opinion.  Neurology referral placed to [facility] at his request today.      Return in about 6 months (around 07/07/2024).   Tobi Fortes, MD

## 2024-01-06 ENCOUNTER — Telehealth: Payer: Self-pay

## 2024-01-06 NOTE — Telephone Encounter (Signed)
 Patient advised.

## 2024-01-06 NOTE — Telephone Encounter (Signed)
 Copied from CRM [id number]. Topic: General - Call Back - No Documentation >> Jan 06, 2024  2:03 PM Tiffany S wrote: Reason for CRM: Patient need compression ratio from office visit 01/05/24 to order his compression hose

## 2024-01-12 ENCOUNTER — Ambulatory Visit: Payer: Medicare PPO | Admitting: Internal Medicine

## 2024-02-23 ENCOUNTER — Other Ambulatory Visit: Payer: Self-pay

## 2024-02-23 ENCOUNTER — Telehealth: Payer: Self-pay | Admitting: Urology

## 2024-02-23 MED ORDER — GEMTESA 75 MG PO TABS: 75.0000 mg | ORAL_TABLET | Freq: Every day | ORAL | 11 refills | Status: DC

## 2024-02-23 NOTE — Telephone Encounter (Signed)
 Patient calling the office for samples of medication:   1.  What medication and dosage are you requesting samples for? Gemtesa   2.  Are you currently out of this medication?  Yes  Since being out of the medication he is having to wear depends because of the bladder leakage,  when he was taking it everyday he didn't have this issue.

## 2024-02-23 NOTE — Telephone Encounter (Signed)
 Patient called and made aware a prescription will be sent in and samples will be place up front to last until possible PA is done.

## 2024-03-08 ENCOUNTER — Other Ambulatory Visit: Payer: Medicare PPO

## 2024-03-11 ENCOUNTER — Other Ambulatory Visit: Payer: Self-pay | Admitting: Internal Medicine

## 2024-03-11 DIAGNOSIS — J209 Acute bronchitis, unspecified: Secondary | ICD-10-CM

## 2024-03-12 ENCOUNTER — Ambulatory Visit (INDEPENDENT_AMBULATORY_CARE_PROVIDER_SITE_OTHER): Admitting: Urology

## 2024-03-12 ENCOUNTER — Encounter: Payer: Self-pay | Admitting: Urology

## 2024-03-12 ENCOUNTER — Inpatient Hospital Stay: Payer: Medicare PPO | Attending: Hematology

## 2024-03-12 VITALS — BP 162/78 | HR 57

## 2024-03-12 DIAGNOSIS — Z8249 Family history of ischemic heart disease and other diseases of the circulatory system: Secondary | ICD-10-CM | POA: Diagnosis not present

## 2024-03-12 DIAGNOSIS — Z8719 Personal history of other diseases of the digestive system: Secondary | ICD-10-CM | POA: Diagnosis not present

## 2024-03-12 DIAGNOSIS — Z801 Family history of malignant neoplasm of trachea, bronchus and lung: Secondary | ICD-10-CM | POA: Diagnosis not present

## 2024-03-12 DIAGNOSIS — R32 Unspecified urinary incontinence: Secondary | ICD-10-CM | POA: Diagnosis not present

## 2024-03-12 DIAGNOSIS — Z9221 Personal history of antineoplastic chemotherapy: Secondary | ICD-10-CM | POA: Diagnosis not present

## 2024-03-12 DIAGNOSIS — Z85038 Personal history of other malignant neoplasm of large intestine: Secondary | ICD-10-CM | POA: Insufficient documentation

## 2024-03-12 DIAGNOSIS — G629 Polyneuropathy, unspecified: Secondary | ICD-10-CM | POA: Insufficient documentation

## 2024-03-12 DIAGNOSIS — I2510 Atherosclerotic heart disease of native coronary artery without angina pectoris: Secondary | ICD-10-CM | POA: Diagnosis not present

## 2024-03-12 DIAGNOSIS — Z923 Personal history of irradiation: Secondary | ICD-10-CM | POA: Diagnosis not present

## 2024-03-12 DIAGNOSIS — R053 Chronic cough: Secondary | ICD-10-CM | POA: Insufficient documentation

## 2024-03-12 DIAGNOSIS — C9110 Chronic lymphocytic leukemia of B-cell type not having achieved remission: Secondary | ICD-10-CM | POA: Insufficient documentation

## 2024-03-12 DIAGNOSIS — Z8042 Family history of malignant neoplasm of prostate: Secondary | ICD-10-CM | POA: Insufficient documentation

## 2024-03-12 DIAGNOSIS — Z8546 Personal history of malignant neoplasm of prostate: Secondary | ICD-10-CM | POA: Diagnosis not present

## 2024-03-12 DIAGNOSIS — C61 Malignant neoplasm of prostate: Secondary | ICD-10-CM | POA: Diagnosis not present

## 2024-03-12 DIAGNOSIS — Z9049 Acquired absence of other specified parts of digestive tract: Secondary | ICD-10-CM | POA: Diagnosis not present

## 2024-03-12 DIAGNOSIS — Z800 Family history of malignant neoplasm of digestive organs: Secondary | ICD-10-CM | POA: Diagnosis not present

## 2024-03-12 LAB — COMPREHENSIVE METABOLIC PANEL WITH GFR
ALT: 28 U/L (ref 0–44)
AST: 25 U/L (ref 15–41)
Albumin: 3.5 g/dL (ref 3.5–5.0)
Alkaline Phosphatase: 65 U/L (ref 38–126)
Anion gap: 10 (ref 5–15)
BUN: 14 mg/dL (ref 8–23)
CO2: 25 mmol/L (ref 22–32)
Calcium: 9.3 mg/dL (ref 8.9–10.3)
Chloride: 111 mmol/L (ref 98–111)
Creatinine, Ser: 1.21 mg/dL (ref 0.61–1.24)
GFR, Estimated: 58 mL/min — ABNORMAL LOW (ref >60)
Glucose, Bld: 115 mg/dL — ABNORMAL HIGH (ref 70–99)
Potassium: 4.5 mmol/L (ref 3.5–5.1)
Sodium: 146 mmol/L — ABNORMAL HIGH (ref 135–145)
Total Bilirubin: 0.9 mg/dL (ref 0.0–1.2)
Total Protein: 6.7 g/dL (ref 6.5–8.1)

## 2024-03-12 LAB — CBC WITH DIFFERENTIAL/PLATELET
Abs Immature Granulocytes: 0.06 K/uL (ref 0.00–0.07)
Basophils Absolute: 0.1 K/uL (ref 0.0–0.1)
Basophils Relative: 0 %
Eosinophils Absolute: 0.3 K/uL (ref 0.0–0.5)
Eosinophils Relative: 2 %
HCT: 43.5 % (ref 39.0–52.0)
Hemoglobin: 14.4 g/dL (ref 13.0–17.0)
Immature Granulocytes: 0 %
Lymphocytes Relative: 53 %
Lymphs Abs: 9.5 K/uL — ABNORMAL HIGH (ref 0.7–4.0)
MCH: 31.9 pg (ref 26.0–34.0)
MCHC: 33.1 g/dL (ref 30.0–36.0)
MCV: 96.2 fL (ref 80.0–100.0)
Monocytes Absolute: 1.3 K/uL — ABNORMAL HIGH (ref 0.1–1.0)
Monocytes Relative: 7 %
Neutro Abs: 6.7 K/uL (ref 1.7–7.7)
Neutrophils Relative %: 38 %
Platelets: 372 K/uL (ref 150–400)
RBC: 4.52 MIL/uL (ref 4.22–5.81)
RDW: 12.6 % (ref 11.5–15.5)
WBC: 18 K/uL — ABNORMAL HIGH (ref 4.0–10.5)
nRBC: 0 % (ref 0.0–0.2)

## 2024-03-12 LAB — LACTATE DEHYDROGENASE: LDH: 126 U/L (ref 98–192)

## 2024-03-12 LAB — URINALYSIS, ROUTINE W REFLEX MICROSCOPIC
Bilirubin, UA: NEGATIVE
Glucose, UA: NEGATIVE
Ketones, UA: NEGATIVE
Leukocytes,UA: NEGATIVE
Nitrite, UA: NEGATIVE
Protein,UA: NEGATIVE
RBC, UA: NEGATIVE
Specific Gravity, UA: 1.03 (ref 1.005–1.030)
Urobilinogen, Ur: 0.2 mg/dL (ref 0.2–1.0)
pH, UA: 5.5 (ref 5.0–7.5)

## 2024-03-12 LAB — BLADDER SCAN AMB NON-IMAGING: Scan Result: 17

## 2024-03-12 MED ORDER — GEMTESA 75 MG PO TABS: 75.0000 mg | ORAL_TABLET | Freq: Every day | ORAL | 11 refills | Status: DC

## 2024-03-12 NOTE — Patient Instructions (Signed)

## 2024-03-12 NOTE — Progress Notes (Signed)
 Bladder Scan completed today.  Patient can void prior to the bladder scan. Bladder scan result: 17 ml  Performed By: Veleria, CMA  Additional notes-  MD to see after

## 2024-03-12 NOTE — Progress Notes (Signed)
 03/12/2024 11:00 AM   David Pugh. June 08, 1935 [id number]  Referring provider: Melvenia Manus BRAVO, MD [address] [place],  [place] [address]  Followup urge incontinence    HPI: David Pugh is a 89yo here for followup fro urge incontinence. PVR 17cc. He was started on gemtesa  75mg  last visit and noted resolution of his nocturia. No incontinence during the day as long as he takes the medication.    PMH: Past Medical History:  Diagnosis Date   Abnormality of gait 12/18/2013   Arthritis    Basal cell carcinoma of scalp and skin of neck    Bilateral foot-drop 04/02/2019   Biliary dyskinesia    CAD (coronary artery disease) 2012   a. DES x2 in ramus/OM and mid LAD in 2012  b. balloon angioplaty of OM 2015   CLL (chronic lymphocytic leukemia) (HCC)    Colon cancer (HCC) 2013   Right hemicolectomy, did not tolerate chemotherapy   Colon polyps    tubular adenomas   Diverticula of colon 2010   Essential hypertension    Exposure to Agent Orange    Folic acid  deficiency    Gastroparesis 2014   GERD (gastroesophageal reflux disease)    Glaucoma    Heart palpitations 2014   Hiatal hernia 2010   History of anemia as a child    History of kidney stones    Iron  deficiency 2013   Myocardial infarction [facility]) 2012   Peripheral neuropathy    Prostate cancer (HCC) 2010   Radiation, surgery   PTSD (post-traumatic stress disorder)    Radiation proctitis    Rosacea     Surgical History: Past Surgical History:  Procedure Laterality Date   CARDIAC CATHETERIZATION  02/13/2011   [facility] cardiology   CATARACT EXTRACTION     2011   CATARACT EXTRACTION W/PHACO Right 08/06/2013   Procedure: CATARACT EXTRACTION PHACO AND INTRAOCULAR LENS PLACEMENT (IOC);  Surgeon: Cherene Mania, MD;  Location: [facility];  Service: Ophthalmology;  Laterality: Right;  CDE:20.96   CHOLECYSTECTOMY     COLONOSCOPY  02/12/2004   Dr. Shaaron- L side diverticula, inflammatory  colon polyps    COLONOSCOPY  01/19/2012   RMR: Prominent changes involving the rectal mucosa consistent with radiation-induced proctitis. Multiple colonic  polyps removed as described above. Sigmoid Diverticulosis/ 1.5 x 2 cm relatively flat ulcerated lesion in cecum/path showed adenocarcinoma of the colon. descending colon polyp with tubular adenoma and one fragment of focal high grade dysplasia.    COLONOSCOPY  08/28/2012   MFM:Mjipjupnw proctitis. Colonic diverticulosis/Ulcerations at the surgical anastomosis  path: ulcerated colonic mucosa with prolapse changes, tubular adenoma.    COLONOSCOPY N/A 08/02/2013   RMR: Colonic polyps -removed as described above. Colonic diverticulosis. Status post reight hemicolectomy. Radiation proctitis- asymptomatic.    COLONOSCOPY N/A 05/09/2015   Procedure: COLONOSCOPY;  Surgeon: Lamar CHRISTELLA Shaaron, MD;  Location: [facility];  Service: Endoscopy;  Laterality: N/A;  1200-moved to [address] to notify pt   COLONOSCOPY WITH PROPOFOL  N/A 11/10/2017   Procedure: COLONOSCOPY WITH PROPOFOL ;  Surgeon: Shaaron Lamar CHRISTELLA, MD;  Location: [facility];  Service: Endoscopy;  Laterality: N/A;   CORONARY ANGIOPLASTY  06/17/14   OM1 POBA   CORONARY STENT PLACEMENT  01/2011   2.5x71mm Xience drug-eluting stent in ramus intermedius/OMI vessel, 2.5x26mm Promus Element stent in mid LAD artery   ESOPHAGOGASTRODUODENOSCOPY  10/10/2008   Dr. Kathyleen hiatal hernia, normal esophagus, normal stomach   ESOPHAGOGASTRODUODENOSCOPY  02/12/2004  Dr. Tyra- normal    ESOPHAGOGASTRODUODENOSCOPY N/A 05/09/2015   Procedure: ESOPHAGOGASTRODUODENOSCOPY (EGD);  Surgeon: Lamar CHRISTELLA Hollingshead, MD;  Location: [facility];  Service: Endoscopy;  Laterality: N/A;   ESOPHAGOGASTRODUODENOSCOPY (EGD) WITH PROPOFOL  N/A 11/10/2017   Procedure: ESOPHAGOGASTRODUODENOSCOPY (EGD) WITH PROPOFOL ;  Surgeon: Hollingshead Lamar CHRISTELLA, MD;  Location: [facility];  Service: Endoscopy;  Laterality: N/A;  9:45am   GIVENS CAPSULE STUDY N/A 06/03/2015    Procedure: GIVENS CAPSULE STUDY;  Surgeon: Lamar CHRISTELLA Hollingshead, MD;  Location: [facility];  Service: Endoscopy;  Laterality: N/A;  0700   HOT HEMOSTASIS  11/10/2017   Procedure: HOT HEMOSTASIS (ARGON PLASMA COAGULATION/BICAP);  Surgeon: Hollingshead Lamar CHRISTELLA, MD;  Location: [facility];  Service: Endoscopy;;  rectum   KNEE SURGERY     left knee    LEFT HEART CATHETERIZATION WITH CORONARY ANGIOGRAM N/A 06/17/2014   Procedure: LEFT HEART CATHETERIZATION WITH CORONARY ANGIOGRAM;  Surgeon: Lonni JONETTA Cash, MD;  Location: [facility] CATH LAB;  Service: Cardiovascular;  Laterality: N/A;   PARTIAL COLECTOMY  02/29/2012   Procedure: PARTIAL COLECTOMY;  Surgeon: Elon CHRISTELLA Pacini, MD;  Location: [facility];  Service: General;  Laterality: Right;   POLYPECTOMY  11/10/2017   Procedure: POLYPECTOMY;  Surgeon: Hollingshead Lamar CHRISTELLA, MD;  Location: [facility];  Service: Endoscopy;;  gastric colon   PROSTATECTOMY     TONSILLECTOMY     TRANSURETHRAL RESECTION OF PROSTATE     VASECTOMY      Home Medications:  Allergies as of 03/12/2024       Reactions   Shellfish Allergy Anaphylaxis   Sulfonamide Derivatives Diarrhea, Nausea Only        Medication List        Accurate as of March 12, 2024 11:00 AM. If you have any questions, ask your nurse or doctor.          acetaminophen  500 MG tablet Commonly known as: TYLENOL  Take 500 mg by mouth every 6 (six) hours as needed for mild pain (pain score 1-3).   Azelastine -Fluticasone  137-50 MCG/ACT Susp Place 1 spray into the nose every 12 (twelve) hours.   cholecalciferol  25 MCG (1000 UNIT) tablet Commonly known as: VITAMIN D3 Take 1,000 Units by mouth 2 (two) times daily.   citalopram  10 MG tablet Commonly known as: CELEXA  Take 10 mg by mouth daily.   clopidogrel  75 MG tablet Commonly known as: PLAVIX  take 1 tablet by mouth once daily.   cyanocobalamin  1000 MCG tablet Commonly known as: VITAMIN B12 Take 1,000 mcg by mouth daily.   folic acid  1 MG  tablet Commonly known as: FOLVITE  TAKE (1) TABLET BY MOUTH ONCE DAILY . What changed: See the new instructions.   Gemtesa  75 MG Tabs Generic drug: Vibegron  Take 1 tablet (75 mg total) by mouth daily.   Gemtesa  75 MG Tabs Generic drug: Vibegron  Take 1 tablet (75 mg total) by mouth daily.   latanoprost  0.005 % ophthalmic solution Commonly known as: XALATAN  1 DROP IN EACH EYE AT BEDTIME.   losartan  50 MG tablet Commonly known as: COZAAR  Take 25 mg by mouth 2 (two) times daily.   memantine  10 MG tablet Commonly known as: Namenda  Take 1 tablet (10 mg total) by mouth 2 (two) times daily.   metoprolol  tartrate 50 MG tablet Commonly known as: LOPRESSOR  Take 50 mg by mouth 2 (two) times daily.   montelukast  10 MG tablet Commonly known as: SINGULAIR  Take 10 mg by mouth at bedtime.   multivitamin with minerals Tabs  tablet Take 1 tablet by mouth daily.   nitroGLYCERIN  0.4 MG SL tablet Commonly known as: NITROSTAT  Place 1 tablet (0.4 mg total) under the tongue every 5 (five) minutes x 3 doses as needed for chest pain (if no relief after 3rd dose, proceed to ED or call 911).   pantoprazole  40 MG tablet Commonly known as: PROTONIX  TAKE (1) TABLET TWICE DAILY. What changed: See the new instructions.   pravastatin  40 MG tablet Commonly known as: PRAVACHOL  Take 1 tablet (40 mg total) by mouth at bedtime.   Prevagen Extra Strength 20 MG Caps Generic drug: Apoaequorin Take 1 capsule by mouth daily.   rivastigmine  1.5 MG capsule Commonly known as: EXELON  Take 1 capsule (1.5 mg total) by mouth 2 (two) times daily.   VITAMIN B COMPLEX  PO Take 1 tablet by mouth at bedtime.        Allergies:  Allergies  Allergen Reactions   Shellfish Allergy Anaphylaxis   Sulfonamide Derivatives Diarrhea and Nausea Only    Family History: Family History  Problem Relation Age of Onset   Throat cancer Father        dx in his 102s and again in his 4s; pipe and cigar smoker   Neuropathy  Brother    Prostate cancer Brother 31   Melanoma Brother        dx in his 87s   Cancer Paternal Uncle        smoking related cancer   Heart attack Paternal Grandfather    Stomach cancer Cousin 63   Colon cancer Neg Hx     Social History:  reports that he has never smoked. He has never used smokeless tobacco. He reports current alcohol use. He reports that he does not use drugs.  ROS: All other review of systems were reviewed and are negative except what is noted above in HPI  Physical Exam: BP (!) 162/78   Pulse (!) 57   Constitutional:  Alert and oriented, No acute distress. HEENT: [place] AT, moist mucus membranes.  Trachea midline, no masses. Cardiovascular: No clubbing, cyanosis, or edema. Respiratory: Normal respiratory effort, no increased work of breathing. GI: Abdomen is soft, nontender, nondistended, no abdominal masses GU: No CVA tenderness.  Lymph: No cervical or inguinal lymphadenopathy. Skin: No rashes, bruises or suspicious lesions. Neurologic: Grossly intact, no focal deficits, moving all 4 extremities. Psychiatric: Normal mood and affect.  Laboratory Data: Lab Results  Component Value Date   WBC 14.5 (H) 09/08/2023   HGB 14.3 09/08/2023   HCT 44.6 09/08/2023   MCV 95.5 09/08/2023   PLT 406 (H) 09/08/2023    Lab Results  Component Value Date   CREATININE 1.04 09/08/2023    Lab Results  Component Value Date   PSA 0.26 12/13/2014   PSA 0.34 08/20/2014    No results found for: TESTOSTERONE  Lab Results  Component Value Date   HGBA1C 5.9 (H) 11/25/2013    Urinalysis    Component Value Date/Time   COLORURINE YELLOW 08/18/2023 1840   APPEARANCEUR Clear 12/28/2023 1328   LABSPEC 1.021 08/18/2023 1840   PHURINE 5.0 08/18/2023 1840   GLUCOSEU Negative 12/28/2023 1328   HGBUR SMALL (A) 08/18/2023 1840   BILIRUBINUR Negative 12/28/2023 1328   KETONESUR 5 (A) 08/18/2023 1840   PROTEINUR Negative 12/28/2023 1328   PROTEINUR 30 (A) 08/18/2023 1840    UROBILINOGEN 0.2 11/24/2013 1730   NITRITE Negative 12/28/2023 1328   NITRITE NEGATIVE 08/18/2023 1840   LEUKOCYTESUR Negative 12/28/2023 1328   LEUKOCYTESUR  NEGATIVE 08/18/2023 1840    Lab Results  Component Value Date   LABMICR Comment 12/28/2023   BACTERIA RARE (A) 08/18/2023    Pertinent Imaging:  No results found for this or any previous visit.  No results found for this or any previous visit.  No results found for this or any previous visit.  No results found for this or any previous visit.  No results found for this or any previous visit.  No results found for this or any previous visit.  No results found for this or any previous visit.  Results for orders placed during the hospital encounter of 08/27/15  CT RENAL STONE STUDY  Narrative CLINICAL DATA:  Vomiting and diarrhea for 3 days with intermittent abdominal pain  EXAM: CT ABDOMEN AND PELVIS WITHOUT CONTRAST  TECHNIQUE: Multidetector CT imaging of the abdomen and pelvis was performed following the standard protocol without IV contrast.  COMPARISON:  06/13/2015  FINDINGS: Lung bases show no focal infiltrate or sizable effusion. Minimal scarring is noted in the right lung base stable from the prior exam. The gallbladder is been surgically removed. The liver, spleen, adrenal glands and pancreas are within normal limits. No renal calculi or urinary tract obstructive changes are noted. Hypodensity is noted within the lower pole of the left kidney consistent with a focal cyst.  Mild aortoiliac calcifications are noted without aneurysmal dilatation. Changes consistent with right colectomy are again seen. No obstructive changes are noted.  The bladder is partially distended. No free pelvic fluid is noted. No pelvic mass lesion or lymphadenopathy is noted. Minimal diverticular change is noted without evidence of diverticulitis. Mild aortoiliac calcifications are seen. The osseous structures  are within normal limits.  IMPRESSION: Chronic changes as described above. No significant interval change from the prior exam. No acute abnormality noted.   Electronically Signed By: Oneil Devonshire M.D. On: 08/27/2015 15:06   Assessment & Plan:    1. Urinary incontinence, unspecified type Continue gemtesa  75mg  daily - BLADDER SCAN AMB NON-IMAGING  2. Prostate cancer (HCC) (Primary) -followup 1 year with PSA - Urinalysis, Routine w reflex microscopic   No follow-ups on file.  Belvie Clara, MD  [facility] [place]

## 2024-03-15 ENCOUNTER — Ambulatory Visit: Payer: Medicare PPO | Admitting: Hematology

## 2024-03-19 ENCOUNTER — Inpatient Hospital Stay (HOSPITAL_BASED_OUTPATIENT_CLINIC_OR_DEPARTMENT_OTHER): Payer: Medicare PPO | Admitting: Hematology

## 2024-03-19 VITALS — BP 138/62 | HR 52 | Temp 98.7°F | Resp 20 | Wt 203.7 lb

## 2024-03-19 DIAGNOSIS — C9110 Chronic lymphocytic leukemia of B-cell type not having achieved remission: Secondary | ICD-10-CM | POA: Diagnosis not present

## 2024-03-19 NOTE — Patient Instructions (Addendum)

## 2024-03-19 NOTE — Progress Notes (Signed)
 [facility] [address] [place] [address]    Clinic Day:  03/19/24   Referring physician: Melvenia Manus BRAVO, MD  Patient Care Team: Melvenia Manus BRAVO, MD as PCP - General (Internal Medicine) Debera Jayson MATSU, MD as PCP - Cardiology (Cardiology) Shaaron Lamar HERO, MD (Gastroenterology) Rogers Hai, MD as Medical Oncologist (Hematology) Duwayne Purchase, MD as Consulting Physician (Orthopedic Surgery)   ASSESSMENT & PLAN:   Assessment:  1.  Stage 0 CLL: - He was diagnosed by flow on 12/13/2014   2.  Stage III ascending colon cancer: - He had 2 out of 20 lymph nodes positive. -Status post right hemicolectomy on 02/29/2012, could not tolerate adjuvant Xeloda . - EGD showed multiple benign gastric polyps, otherwise within normal limits. -He is completed 5 years of surveillance.  No more scans needed at this time. -He had a colonoscopy on 11/10/2017 which showed radiation proctitis changes.  Diverticulosis in the sigmoid colon.  One polyp in the ascending colon which was removed   3.  Prostate cancer: -Underwent radiation therapy at [facility] in 2011. -He also underwent a TURP x2. - He follows up with Dr. Shona his urologist in [facility].  Plan:  1.  Stage 0 CLL: - He denies any fevers, night sweats or weight loss in the last 6 months. - He reports decrease in energy levels.  Reports that he is sleeping a lot during the daytime.  He is not able to exercise much due to the heat of the summer and peripheral neuropathy as well.  Also reports some chronic nighttime cough. - Physical exam did not reveal any palpable adenopathy. - Labs from 03/12/2024: LDH normal.  LFTs normal.  CBC shows normal hemoglobin and platelet count.  White count is 18,000, up from 14.5 in January.  Predominantly lymphocytes. - No indication for treatment at this time.  Recommend follow-up in 6 months with repeat exam and labs.  Will check CLL FISH panel at next visit.        Orders Placed This Encounter  Procedures   Chronic Lymphocytic Leukemia (CLL) Profile, FISH    Standing Status:   Future    Expected Date:   09/17/2024    Expiration Date:   12/16/2024   CBC with Differential    Standing Status:   Future    Expected Date:   09/17/2024    Expiration Date:   12/16/2024   Comprehensive metabolic panel    Standing Status:   Future    Expected Date:   09/17/2024    Expiration Date:   12/16/2024   Lactate dehydrogenase    Standing Status:   Future    Expected Date:   09/17/2024    Expiration Date:   12/16/2024      David Pugh,acting as a scribe for [name], MD.,have documented all relevant documentation on the behalf of [name], MD,as directed by  [name], MD while in the presence of [name], MD.  I, [name] MD, have reviewed the above documentation for accuracy and completeness, and I agree with the above.     [name], MD   7/21/20253:31 PM  CHIEF COMPLAINT:   Diagnosis: CLL   Cancer Staging  Cancer of ascending colon [facility]) Staging form: Colon and Rectum, AJCC 7th Edition - Clinical: Stage IIIB (T3, N1c, M0) - Signed by Berry Debby RAMAN, PA on 05/02/2012    Prior Therapy: intermittent feraheme  (last infusion 01/03/2015)   Current Therapy:  surveillance    HISTORY OF PRESENT ILLNESS:   Oncology History  Cancer of ascending colon (HCC)  02/29/2012 Procedure   laparoscopic assisted R colectomy   03/09/2012 Miscellaneous   Iron  deficiency and folic acid  deficiency   03/29/2012 Initial Diagnosis   Cancer of ascending colon, CEA on 01/19/2012 was WNL   08/02/2013 Procedure   Ileocolonoscopy with snare polypectomy   06/11/2014 Imaging   CT abdomen/pelvis with no findings to suggest metastatic disease. Diverticulosis   06/11/2016 Imaging   CT Abdomen/Pelvis with no findings to suggest recurrent or metastatic disease. Prostatomegaly.       INTERVAL HISTORY:    David Pugh is a 89 y.o. male presenting to clinic today for follow up of CLL. He was last seen by me on 09/15/2023.  Today, he states that he is doing well overall. His appetite level is at 100%. His energy level is at 60%.   PAST MEDICAL HISTORY:   Past Medical History: Past Medical History:  Diagnosis Date   Abnormality of gait 12/18/2013   Arthritis    Basal cell carcinoma of scalp and skin of neck    Bilateral foot-drop 04/02/2019   Biliary dyskinesia    CAD (coronary artery disease) 2012   a. DES x2 in ramus/OM and mid LAD in 2012  b. balloon angioplaty of OM 2015   CLL (chronic lymphocytic leukemia) (HCC)    Colon cancer (HCC) 2013   Right hemicolectomy, did not tolerate chemotherapy   Colon polyps    tubular adenomas   Diverticula of colon 2010   Essential hypertension    Exposure to Agent Orange    Folic acid  deficiency    Gastroparesis 2014   GERD (gastroesophageal reflux disease)    Glaucoma    Heart palpitations 2014   Hiatal hernia 2010   History of anemia as a child    History of kidney stones    Iron  deficiency 2013   Myocardial infarction [facility]) 2012   Peripheral neuropathy    Prostate cancer (HCC) 2010   Radiation, surgery   PTSD (post-traumatic stress disorder)    Radiation proctitis    Rosacea     Surgical History: Past Surgical History:  Procedure Laterality Date   CARDIAC CATHETERIZATION  02/13/2011   [facility] cardiology   CATARACT EXTRACTION     2011   CATARACT EXTRACTION W/PHACO Right 08/06/2013   Procedure: CATARACT EXTRACTION PHACO AND INTRAOCULAR LENS PLACEMENT (IOC);  Surgeon: Cherene Mania, MD;  Location: [facility];  Service: Ophthalmology;  Laterality: Right;  CDE:20.96   CHOLECYSTECTOMY     COLONOSCOPY  02/12/2004   Dr. Shaaron- L side diverticula, inflammatory  colon polyps   COLONOSCOPY  01/19/2012   RMR: Prominent changes involving the rectal mucosa consistent with radiation-induced proctitis. Multiple colonic  polyps removed as  described above. Sigmoid Diverticulosis/ 1.5 x 2 cm relatively flat ulcerated lesion in cecum/path showed adenocarcinoma of the colon. descending colon polyp with tubular adenoma and one fragment of focal high grade dysplasia.    COLONOSCOPY  08/28/2012   MFM:Mjipjupnw proctitis. Colonic diverticulosis/Ulcerations at the surgical anastomosis  path: ulcerated colonic mucosa with prolapse changes, tubular adenoma.    COLONOSCOPY N/A 08/02/2013   RMR: Colonic polyps -removed as described above. Colonic diverticulosis. Status post reight hemicolectomy. Radiation proctitis- asymptomatic.    COLONOSCOPY N/A 05/09/2015   Procedure: COLONOSCOPY;  Surgeon: Lamar CHRISTELLA Shaaron, MD;  Location: [facility];  Service: Endoscopy;  Laterality: N/A;  1200-moved to [address]5 [address] to notify pt  COLONOSCOPY WITH PROPOFOL  N/A 11/10/2017   Procedure: COLONOSCOPY WITH PROPOFOL ;  Surgeon: Shaaron Lamar HERO, MD;  Location: [facility];  Service: Endoscopy;  Laterality: N/A;   CORONARY ANGIOPLASTY  06/17/14   OM1 POBA   CORONARY STENT PLACEMENT  01/2011   2.5x70mm Xience drug-eluting stent in ramus intermedius/OMI vessel, 2.5x86mm Promus Element stent in mid LAD artery   ESOPHAGOGASTRODUODENOSCOPY  10/10/2008   Dr. Kathyleen hiatal hernia, normal esophagus, normal stomach   ESOPHAGOGASTRODUODENOSCOPY  02/12/2004   Dr. Tyra- normal    ESOPHAGOGASTRODUODENOSCOPY N/A 05/09/2015   Procedure: ESOPHAGOGASTRODUODENOSCOPY (EGD);  Surgeon: Lamar HERO Shaaron, MD;  Location: [facility];  Service: Endoscopy;  Laterality: N/A;   ESOPHAGOGASTRODUODENOSCOPY (EGD) WITH PROPOFOL  N/A 11/10/2017   Procedure: ESOPHAGOGASTRODUODENOSCOPY (EGD) WITH PROPOFOL ;  Surgeon: Shaaron Lamar HERO, MD;  Location: [facility];  Service: Endoscopy;  Laterality: N/A;  9:45am   GIVENS CAPSULE STUDY N/A 06/03/2015   Procedure: GIVENS CAPSULE STUDY;  Surgeon: Lamar HERO Shaaron, MD;  Location: [facility];  Service: Endoscopy;  Laterality: N/A;  0700   HOT HEMOSTASIS   11/10/2017   Procedure: HOT HEMOSTASIS (ARGON PLASMA COAGULATION/BICAP);  Surgeon: Shaaron Lamar HERO, MD;  Location: [facility];  Service: Endoscopy;;  rectum   KNEE SURGERY     left knee    LEFT HEART CATHETERIZATION WITH CORONARY ANGIOGRAM N/A 06/17/2014   Procedure: LEFT HEART CATHETERIZATION WITH CORONARY ANGIOGRAM;  Surgeon: Lonni JONETTA Cash, MD;  Location: [facility] CATH LAB;  Service: Cardiovascular;  Laterality: N/A;   PARTIAL COLECTOMY  02/29/2012   Procedure: PARTIAL COLECTOMY;  Surgeon: Elon HERO Pacini, MD;  Location: [facility];  Service: General;  Laterality: Right;   POLYPECTOMY  11/10/2017   Procedure: POLYPECTOMY;  Surgeon: Shaaron Lamar HERO, MD;  Location: [facility];  Service: Endoscopy;;  gastric colon   PROSTATECTOMY     TONSILLECTOMY     TRANSURETHRAL RESECTION OF PROSTATE     VASECTOMY      Social History: Social History   Socioeconomic History   Marital status: Married    Spouse name: Not on file   Number of children: 3   Years of education: military   Highest education level: Not on file  Occupational History   Occupation: Retired    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  Tobacco Use   Smoking status: Never   Smokeless tobacco: Never  Vaping Use   Vaping status: Never Used  Substance and Sexual Activity   Alcohol use: Yes    Comment: 1 drink every 6 months   Drug use: No   Sexual activity: Not Currently  Other Topics Concern   Not on file  Social History Narrative   Right handed   Caffeine~1 cup per day    Lives at home with wife    Social Drivers of Health   Financial Resource Strain: Low Risk  (12/15/2022)   Received from [facility]   Overall Financial Resource Strain (CARDIA)    Difficulty of Paying Living Expenses: Not hard at all  Food Insecurity: No Food Insecurity (08/18/2023)   Hunger Vital Sign    Worried About Running Out of Food in the Last Year: Never true    Ran Out of Food in the Last Year: Never true  Transportation Needs: No Transportation  Needs (08/18/2023)   PRAPARE - Administrator, Civil Service (Medical): No    Lack of Transportation (Non-Medical): No  Physical Activity: Inactive (07/14/2020)   Exercise Vital Sign    Days  of Exercise per Week: 0 days    Minutes of Exercise per Session: 0 min  Stress: No Stress Concern Present (12/15/2022)   Received from [facility] Occup[facility] - Occupational Stress Questionnaire    Feeling of Stress : Not at all  Social Connections: Unknown (01/11/2022)   Received from [facility]   Social Network    Social Network: Not on file  Intimate Partner Violence: Not At Risk (08/18/2023)   Humiliation, Afraid, Rape, and Kick questionnaire    Fear of Current or Ex-Partner: No    Emotionally Abused: No    Physically Abused: No    Sexually Abused: No    Family History: Family History  Problem Relation Age of Onset   Throat cancer Father        dx in his 39s and again in his 7s; pipe and cigar smoker   Neuropathy Brother    Prostate cancer Brother 52   Melanoma Brother        dx in his 54s   Cancer Paternal Uncle        smoking related cancer   Heart attack Paternal Grandfather    Stomach cancer Cousin 45   Colon cancer Neg Hx     Current Medications:  Current Outpatient Medications:    acetaminophen  (TYLENOL ) 500 MG tablet, Take 500 mg by mouth every 6 (six) hours as needed for mild pain (pain score 1-3)., Disp: , Rfl:    Apoaequorin (PREVAGEN EXTRA STRENGTH) 20 MG CAPS, Take 1 capsule by mouth daily., Disp: , Rfl:    Azelastine -Fluticasone  137-50 MCG/ACT SUSP, Place 1 spray into the nose every 12 (twelve) hours., Disp: 23 g, Rfl: 1   B Complex Vitamins (VITAMIN B COMPLEX  PO), Take 1 tablet by mouth at bedtime. , Disp: , Rfl:    cholecalciferol  (VITAMIN D3) 25 MCG (1000 UNIT) tablet, Take 1,000 Units by mouth 2 (two) times daily., Disp: , Rfl:    citalopram  (CELEXA ) 10 MG tablet, Take 10 mg by mouth daily., Disp: , Rfl:     clopidogrel  (PLAVIX ) 75 MG tablet, take 1 tablet by mouth once daily., Disp: 30 tablet, Rfl: 11   folic acid  (FOLVITE ) 1 MG tablet, TAKE (1) TABLET BY MOUTH ONCE DAILY . (Patient taking differently: Take 1 mg by mouth daily.), Disp: 30 tablet, Rfl: 0   latanoprost  (XALATAN ) 0.005 % ophthalmic solution, 1 DROP IN EACH EYE AT BEDTIME., Disp: 2.5 mL, Rfl: 12   losartan  (COZAAR ) 50 MG tablet, Take 25 mg by mouth 2 (two) times daily. , Disp: , Rfl:    memantine  (NAMENDA ) 10 MG tablet, Take 1 tablet (10 mg total) by mouth 2 (two) times daily., Disp: 180 tablet, Rfl: 3   metoprolol  (LOPRESSOR ) 50 MG tablet, Take 50 mg by mouth 2 (two) times daily., Disp: , Rfl:    montelukast  (SINGULAIR ) 10 MG tablet, Take 10 mg by mouth at bedtime., Disp: , Rfl:    Multiple Vitamin (MULTIVITAMIN WITH MINERALS) TABS tablet, Take 1 tablet by mouth daily., Disp: , Rfl:    nitroGLYCERIN  (NITROSTAT ) 0.4 MG SL tablet, Place 1 tablet (0.4 mg total) under the tongue every 5 (five) minutes x 3 doses as needed for chest pain (if no relief after 3rd dose, proceed to ED or call 911)., Disp: 25 tablet, Rfl: 3   pantoprazole  (PROTONIX ) 40 MG tablet, TAKE (1) TABLET TWICE DAILY. (Patient taking differently: Take 40 mg by mouth daily.), Disp: 60 tablet, Rfl: 5  pravastatin  (PRAVACHOL ) 40 MG tablet, Take 1 tablet (40 mg total) by mouth at bedtime., Disp: 90 tablet, Rfl: 3   rivastigmine  (EXELON ) 1.5 MG capsule, Take 1 capsule (1.5 mg total) by mouth 2 (two) times daily., Disp: 180 capsule, Rfl: 3   Vibegron  (GEMTESA ) 75 MG TABS, Take 1 tablet (75 mg total) by mouth daily., Disp: 30 tablet, Rfl: 11   vitamin B-12 (CYANOCOBALAMIN ) 1000 MCG tablet, Take 1,000 mcg by mouth daily., Disp: , Rfl:    Allergies: Allergies  Allergen Reactions   Shellfish Allergy Anaphylaxis   Sulfonamide Derivatives Diarrhea and Nausea Only    REVIEW OF SYSTEMS:   Review of Systems  Constitutional:  Negative for chills, fatigue and fever.  HENT:    Negative for lump/mass, mouth sores, nosebleeds, sore throat and trouble swallowing.   Eyes:  Negative for eye problems.  Respiratory:  Positive for cough. Negative for shortness of breath.   Cardiovascular:  Negative for chest pain, leg swelling and palpitations.  Gastrointestinal:  Negative for abdominal pain, constipation, diarrhea, nausea and vomiting.  Genitourinary:  Negative for bladder incontinence, difficulty urinating, dysuria, frequency, hematuria and nocturia.   Musculoskeletal:  Negative for arthralgias, back pain, flank pain, myalgias and neck pain.  Skin:  Negative for itching and rash.  Neurological:  Negative for dizziness, headaches and numbness.  Hematological:  Does not bruise/bleed easily.  Psychiatric/Behavioral:  Negative for depression, sleep disturbance and suicidal ideas. The patient is not nervous/anxious.   All other systems reviewed and are negative.    VITALS:   Blood pressure 138/62, pulse (!) 52, temperature 98.7 F (37.1 C), temperature source Tympanic, resp. rate 20, weight 203 lb 11.3 oz (92.4 kg), SpO2 98%.  Wt Readings from Last 3 Encounters:  03/19/24 203 lb 11.3 oz (92.4 kg)  01/05/24 202 lb 6.4 oz (91.8 kg)  11/23/23 194 lb (88 kg)    Body mass index is 28.82 kg/m.  Performance status (ECOG): 1 - Symptomatic but completely ambulatory  PHYSICAL EXAM:   Physical Exam Vitals and nursing note reviewed. Exam conducted with a chaperone present.  Constitutional:      Appearance: Normal appearance.  Cardiovascular:     Rate and Rhythm: Normal rate and regular rhythm.     Pulses: Normal pulses.     Heart sounds: Normal heart sounds.  Pulmonary:     Effort: Pulmonary effort is normal.     Breath sounds: Normal breath sounds.  Abdominal:     Palpations: Abdomen is soft. There is no hepatomegaly, splenomegaly or mass.     Tenderness: There is no abdominal tenderness.  Musculoskeletal:     Right lower leg: No edema.     Left lower leg: No  edema.  Lymphadenopathy:     Cervical: No cervical adenopathy.     Right cervical: No superficial, deep or posterior cervical adenopathy.    Left cervical: No superficial, deep or posterior cervical adenopathy.     Upper Body:     Right upper body: No supraclavicular or axillary adenopathy.     Left upper body: No supraclavicular or axillary adenopathy.  Neurological:     General: No focal deficit present.     Mental Status: He is alert and oriented to person, place, and time.  Psychiatric:        Mood and Affect: Mood normal.        Behavior: Behavior normal.     LABS:      Latest Ref Rng & Units 03/12/2024  2:17 PM 09/08/2023    1:50 PM 08/20/2023    9:16 AM  CBC  WBC 4.0 - 10.5 K/uL 18.0  14.5  9.0   Hemoglobin 13.0 - 17.0 g/dL 85.5  85.6  86.5   Hematocrit 39.0 - 52.0 % 43.5  44.6  39.5   Platelets 150 - 400 K/uL 372  406  233       Latest Ref Rng & Units 03/12/2024    2:17 PM 09/08/2023    1:50 PM 08/20/2023    9:16 AM  CMP  Glucose 70 - 99 mg/dL 884  892  895   BUN 8 - 23 mg/dL 14  16  16    Creatinine 0.61 - 1.24 mg/dL 8.78  8.95  8.86   Sodium 135 - 145 mmol/L 146  139  134   Potassium 3.5 - 5.1 mmol/L 4.5  4.0  3.7   Chloride 98 - 111 mmol/L 111  104  105   CO2 22 - 32 mmol/L 25  26  18    Calcium 8.9 - 10.3 mg/dL 9.3  9.0  8.2   Total Protein 6.5 - 8.1 g/dL 6.7  7.1    Total Bilirubin 0.0 - 1.2 mg/dL 0.9  0.9    Alkaline Phos 38 - 126 U/L 65  70    AST 15 - 41 U/L 25  27    ALT 0 - 44 U/L 28  23       Lab Results  Component Value Date   CEA1 1.4 09/01/2022   CEA 1.6 07/11/2015   /  CEA  Date Value Ref Range Status  09/01/2022 1.4 0.0 - 4.7 ng/mL Final    Comment:    (NOTE)                             Nonsmokers          <3.9                             Smokers             <5.6 Roche [employer] Electrochemiluminescence Immunoassay (ECLIA) Values obtained with different assay methods or kits cannot be used interchangeably.  Results cannot  be interpreted as absolute evidence of the presence or absence of malignant disease. Performed At: [facility] [address] [place], [place] [id number] Jennette Shorter MD Ey:[id number]   07/11/2015 1.6 0.0 - 4.7 ng/mL Final    Comment:    (NOTE)       Roche ECLIA methodology       Nonsmokers  <3.9                                     Smokers     <5.6 Performed At: [facility] [address] [name], [place] [id number] Garwin Elsie FALCON MD Ey:[id number]    No results found for: PSA1 No results found for: CAN199 No results found for: CAN125  No results found for: STEPHANY RINGS, A1GS, A2GS, BETS, BETA2SER, GAMS, MSPIKE, SPEI Lab Results  Component Value Date   TIBC 327 01/13/2021   TIBC 336 07/09/2020   TIBC 273 05/30/2013   FERRITIN 67 01/13/2021   FERRITIN 52 07/09/2020   FERRITIN 71 09/27/2016   IRONPCTSAT 47 (H) 01/13/2021   IRONPCTSAT 34 07/09/2020   IRONPCTSAT  74 (H) 05/30/2013   Lab Results  Component Value Date   LDH 126 03/12/2024   LDH 200 (H) 09/08/2023   LDH 147 03/10/2023     STUDIES:   No results found.

## 2024-03-22 ENCOUNTER — Ambulatory Visit: Admitting: Nurse Practitioner

## 2024-03-22 ENCOUNTER — Encounter: Payer: Self-pay | Admitting: Nurse Practitioner

## 2024-03-22 VITALS — BP 138/80 | HR 58 | Ht 70.5 in | Wt 203.0 lb

## 2024-03-22 DIAGNOSIS — I10 Essential (primary) hypertension: Secondary | ICD-10-CM

## 2024-03-22 DIAGNOSIS — K219 Gastro-esophageal reflux disease without esophagitis: Secondary | ICD-10-CM

## 2024-03-22 DIAGNOSIS — J302 Other seasonal allergic rhinitis: Secondary | ICD-10-CM

## 2024-03-22 NOTE — Progress Notes (Signed)
 Established Patient Office Visit  Subjective:  Patient ID: David Pugh., male    DOB: [birth year]  Age: 89 y.o. MRN: [id number]  Chief Complaint  Patient presents with   Care Management    Follow up   Cough    Coughing spells at night     Pt here to establish primary care with this provider.  He reports having a night time cough chronic that he has been concerned with.  No hx of smoking.  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    Cough    No other concerns at this time.   Past Medical History:  Diagnosis Date   Abnormality of gait 12/18/2013   Arthritis    Basal cell carcinoma of scalp and skin of neck    Bilateral foot-drop 04/02/2019   Biliary dyskinesia    CAD (coronary artery disease) 2012   a. DES x2 in ramus/OM and mid LAD in 2012  b. balloon angioplaty of OM 2015   CLL (chronic lymphocytic leukemia) (HCC)    Colon cancer (HCC) 2013   Right hemicolectomy, did not tolerate chemotherapy   Colon polyps    tubular adenomas   Diverticula of colon 2010   Essential hypertension    Exposure to Agent Orange    Folic acid  deficiency    Gastroparesis 2014   GERD (gastroesophageal reflux disease)    Glaucoma    Heart palpitations 2014   Hiatal hernia 2010   History of anemia as a child    History of kidney stones    Iron  deficiency 2013   Myocardial infarction [facility]) 2012   Peripheral neuropathy    Prostate cancer (HCC) 2010   Radiation, surgery   PTSD (post-traumatic stress disorder)    Radiation proctitis    Rosacea     Past Surgical History:  Procedure Laterality Date   CARDIAC CATHETERIZATION  02/13/2011   [facility] cardiology   CATARACT EXTRACTION     2011   CATARACT EXTRACTION W/PHACO Right 08/06/2013   Procedure: CATARACT EXTRACTION PHACO AND INTRAOCULAR LENS PLACEMENT (IOC);  Surgeon: Cherene Mania, MD;  Location: [facility];  Service: Ophthalmology;  Laterality: Right;  CDE:20.96   CHOLECYSTECTOMY      COLONOSCOPY  02/12/2004   Dr. Shaaron- L side diverticula, inflammatory  colon polyps   COLONOSCOPY  01/19/2012   RMR: Prominent changes involving the rectal mucosa consistent with radiation-induced proctitis. Multiple colonic  polyps removed as described above. Sigmoid Diverticulosis/ 1.5 x 2 cm relatively flat ulcerated lesion in cecum/path showed adenocarcinoma of the colon. descending colon polyp with tubular adenoma and one fragment of focal high grade dysplasia.    COLONOSCOPY  08/28/2012   MFM:Mjipjupnw proctitis. Colonic diverticulosis/Ulcerations at the surgical anastomosis  path: ulcerated colonic mucosa with prolapse changes, tubular adenoma.    COLONOSCOPY N/A 08/02/2013   RMR: Colonic polyps -removed as described above. Colonic diverticulosis. Status post reight hemicolectomy. Radiation proctitis- asymptomatic.    COLONOSCOPY N/A 05/09/2015   Procedure: COLONOSCOPY;  Surgeon: Lamar CHRISTELLA Shaaron, MD;  Location: [facility];  Service: Endoscopy;  Laterality: N/A;  1200-moved to [address]5 [address] to notify pt   COLONOSCOPY WITH PROPOFOL  N/A 11/10/2017   Procedure: COLONOSCOPY WITH PROPOFOL ;  Surgeon: Shaaron Lamar CHRISTELLA, MD;  Location: [facility];  Service: Endoscopy;  Laterality: N/A;   CORONARY ANGIOPLASTY  06/17/14   OM1 POBA   CORONARY STENT PLACEMENT  01/2011   2.5x52mm Xience drug-eluting stent in ramus intermedius/OMI  vessel, 2.5x68mm Promus Element stent in mid LAD artery   ESOPHAGOGASTRODUODENOSCOPY  10/10/2008   Dr. Kathyleen hiatal hernia, normal esophagus, normal stomach   ESOPHAGOGASTRODUODENOSCOPY  02/12/2004   Dr. Tyra- normal    ESOPHAGOGASTRODUODENOSCOPY N/A 05/09/2015   Procedure: ESOPHAGOGASTRODUODENOSCOPY (EGD);  Surgeon: Lamar CHRISTELLA Hollingshead, MD;  [facility];  Service: Endoscopy;  Laterality: N/A;   ESOPHAGOGASTRODUODENOSCOPY (EGD) WITH PROPOFOL  N/A 11/10/2017   Procedure: ESOPHAGOGASTRODUODENOSCOPY (EGD) WITH PROPOFOL ;  Surgeon: Hollingshead Lamar CHRISTELLA, MD;  Location: [facility];   Service: Endoscopy;  Laterality: N/A;  9:45am   GIVENS CAPSULE STUDY N/A 06/03/2015   Procedure: GIVENS CAPSULE STUDY;  Surgeon: Lamar CHRISTELLA Hollingshead, MD;  [facility]: [facility];  Service: Endoscopy;  Laterality: N/A;  0700   HOT HEMOSTASIS  11/10/2017   Procedure: HOT HEMOSTASIS (ARGON PLASMA COAGULATION/BICAP);  Surgeon: Hollingshead Lamar CHRISTELLA, MD;  [facility];  Service: Endoscopy;;  rectum   KNEE SURGERY     left knee    LEFT HEART CATHETERIZATION WITH CORONARY ANGIOGRAM N/A 06/17/2014   Procedure: LEFT HEART CATHETERIZATION WITH CORONARY ANGIOGRAM;  Surgeon: Lonni JONETTA Cash, MD;  Location: [facility] CATH LAB;  Service: Cardiovascular;  Laterality: N/A;   PARTIAL COLECTOMY  02/29/2012   Procedure: PARTIAL COLECTOMY;  Surgeon: Elon CHRISTELLA Pacini, MD;  Location: [facility];  Service: General;  Laterality: Right;   POLYPECTOMY  11/10/2017   Procedure: POLYPECTOMY;  Surgeon: Hollingshead Lamar CHRISTELLA, MD;  [facility];  Service: Endoscopy;;  gastric colon   PROSTATECTOMY     TONSILLECTOMY     TRANSURETHRAL RESECTION OF PROSTATE     VASECTOMY      Social History   Socioeconomic History   Marital status: Married    Spouse name: Not on file   Number of children: 3   Years of education: military   Highest education level: Not on file  Occupational History   Occupation: Retired    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  Tobacco Use   Smoking status: Never   Smokeless tobacco: Never  Vaping Use   Vaping status: Never Used  Substance and Sexual Activity   Alcohol use: Yes    Comment: 1 drink every 6 months   Drug use: No   Sexual activity: Not Currently  Other Topics Concern   Not on file  Social History Narrative   Right handed   Caffeine~1 cup per day    Lives at home with wife    Social Drivers of Health   Financial Resource Strain: Low Risk  (12/15/2022)   [facility]   Overall Financial Resource Strain (CARDIA)    Difficulty of Paying Living Expenses: Not hard at all  Food  Insecurity: No Food Insecurity (08/18/2023)   Hunger Vital Sign    Worried About Running Out of Food in the Last Year: Never true    Ran Out of Food in the Last Year: Never true  Transportation Needs: No Transportation Needs (08/18/2023)   PRAPARE - Administrator, Civil Service (Medical): No    Lack of Transportation (Non-Medical): No  Physical Activity: Inactive (07/14/2020)   Exercise Vital Sign    Days of Exercise per Week: 0 days    Minutes of Exercise per Session: 0 min  Stress: No Stress Concern Present (12/15/2022)   Received from [facility] of Occupational [facility] - Occupational Stress Questionnaire    Feeling of Stress : Not at all  Social Connections: Unknown (01/11/2022)  [facility]   Social Network    Social Network: Not on file  Intimate Partner Violence: Not At Risk (08/18/2023)   Humiliation, Afraid, Rape, and Kick questionnaire    Fear of Current or Ex-Partner: No    Emotionally Abused: No    Physically Abused: No    Sexually Abused: No    Family History  Problem Relation Age of Onset   Throat cancer Father        dx in his 30s and again in his 19s; pipe and cigar smoker   Neuropathy Brother    Prostate cancer Brother 12   Melanoma Brother        dx in his 47s   Cancer Paternal Uncle        smoking related cancer   Heart attack Paternal Grandfather    Stomach cancer Cousin 43   Colon cancer Neg Hx     Allergies  Allergen Reactions   Shellfish Allergy Anaphylaxis   Sulfonamide Derivatives Diarrhea and Nausea Only    Outpatient Medications Prior to Visit  Medication Sig   acetaminophen  (TYLENOL ) 500 MG tablet Take 500 mg by mouth every 6 (six) hours as needed for mild pain (pain score 1-3).   Apoaequorin (PREVAGEN EXTRA STRENGTH) 20 MG CAPS Take 1 capsule by mouth daily.   Azelastine -Fluticasone  137-50 MCG/ACT SUSP Place 1 spray into the nose every 12 (twelve) hours.   B Complex Vitamins (VITAMIN B  COMPLEX PO) Take 1 tablet by mouth at bedtime.    cholecalciferol  (VITAMIN D3) 25 MCG (1000 UNIT) tablet Take 1,000 Units by mouth 2 (two) times daily.   citalopram  (CELEXA ) 10 MG tablet Take 10 mg by mouth daily.   clopidogrel  (PLAVIX ) 75 MG tablet take 1 tablet by mouth once daily.   folic acid  (FOLVITE ) 1 MG tablet TAKE (1) TABLET BY MOUTH ONCE DAILY . (Patient taking differently: Take 1 mg by mouth daily.)   latanoprost  (XALATAN ) 0.005 % ophthalmic solution 1 DROP IN EACH EYE AT BEDTIME.   losartan  (COZAAR ) 50 MG tablet Take 25 mg by mouth 2 (two) times daily.    memantine  (NAMENDA ) 10 MG tablet Take 1 tablet (10 mg total) by mouth 2 (two) times daily.   metoprolol  (LOPRESSOR ) 50 MG tablet Take 50 mg by mouth 2 (two) times daily.   montelukast  (SINGULAIR ) 10 MG tablet Take 10 mg by mouth at bedtime.   Multiple Vitamin (MULTIVITAMIN WITH MINERALS) TABS tablet Take 1 tablet by mouth daily.   nitroGLYCERIN  (NITROSTAT ) 0.4 MG SL tablet Place 1 tablet (0.4 mg total) under the tongue every 5 (five) minutes x 3 doses as needed for chest pain (if no relief after 3rd dose, proceed to ED or call 911).   pantoprazole  (PROTONIX ) 40 MG tablet TAKE (1) TABLET TWICE DAILY. (Patient taking differently: Take 40 mg by mouth daily.)   pravastatin  (PRAVACHOL ) 40 MG tablet Take 1 tablet (40 mg total) by mouth at bedtime.   rivastigmine  (EXELON ) 1.5 MG capsule Take 1 capsule (1.5 mg total) by mouth 2 (two) times daily.   Vibegron  (GEMTESA ) 75 MG TABS Take 1 tablet (75 mg total) by mouth daily.   vitamin B-12 (CYANOCOBALAMIN ) 1000 MCG tablet Take 1,000 mcg by mouth daily.   No facility-administered medications prior to visit.    Review of Systems  Respiratory:  Positive for cough.        Objective:   BP 138/80   Pulse (!) 58   Ht 5' 10.5 (1.791 m)  Wt 203 lb (92.1 kg)   SpO2 96%   BMI 28.72 kg/m   Vitals:   03/22/24 1110  BP: 138/80  Pulse: (!) 58  Height: 5' 10.5 (1.791 m)  Weight: 203 lb  (92.1 kg)  SpO2: 96%  BMI (Calculated): 28.71    Physical Exam Vitals and nursing note reviewed.  Constitutional:      Appearance: Normal appearance.  HENT:     Head: Normocephalic.     Nose: Nose normal.     Mouth/Throat:     Mouth: Mucous membranes are dry.  Eyes:     Pupils: Pupils are equal, round, and reactive to light.  Cardiovascular:     Rate and Rhythm: Normal rate and regular rhythm.     Pulses: Normal pulses.     Heart sounds: Normal heart sounds.  Pulmonary:     Effort: Pulmonary effort is normal.     Breath sounds: Normal breath sounds.  Abdominal:     General: Bowel sounds are normal.     Palpations: Abdomen is soft.  Musculoskeletal:        General: Normal range of motion.     Cervical back: Normal range of motion and neck supple.  Skin:    General: Skin is warm and dry.  Neurological:     Mental Status: He is alert and oriented to person, place, and time.  Psychiatric:        Mood and Affect: Mood normal.        Behavior: Behavior normal.      No results found for any visits on 03/22/24.  Recent Results (from the past 2160 hours)  Urinalysis, Routine w reflex microscopic     Status: None   Collection Time: 12/28/23  1:28 PM  Result Value Ref Range   Specific Gravity, UA 1.015 1.005 - 1.030   pH, UA 6.0 5.0 - 7.5   Color, UA Yellow Yellow   Appearance Ur Clear Clear   Leukocytes,UA Negative Negative   Protein,UA Negative Negative/Trace   Glucose, UA Negative Negative   Ketones, UA Negative Negative   RBC, UA Negative Negative   Bilirubin, UA Negative Negative   Urobilinogen, Ur 0.2 0.2 - 1.0 mg/dL   Nitrite, UA Negative Negative   Microscopic Examination Comment     Comment: Microscopic not indicated and not performed.  BLADDER SCAN AMB NON-IMAGING     Status: None   Collection Time: 12/28/23  1:35 PM  Result Value Ref Range   Scan Result 25   Urinalysis, Routine w reflex microscopic     Status: None   Collection Time: 03/12/24 10:50 AM   Result Value Ref Range   Specific Gravity, UA 1.030 1.005 - 1.030   pH, UA 5.5 5.0 - 7.5   Color, UA Yellow Yellow   Appearance Ur Clear Clear   Leukocytes,UA Negative Negative   Protein,UA Negative Negative/Trace   Glucose, UA Negative Negative   Ketones, UA Negative Negative   RBC, UA Negative Negative   Bilirubin, UA Negative Negative   Urobilinogen, Ur 0.2 0.2 - 1.0 mg/dL   Nitrite, UA Negative Negative   Microscopic Examination Comment     Comment: Microscopic not indicated and not performed.  BLADDER SCAN AMB NON-IMAGING     Status: None   Collection Time: 03/12/24 10:50 AM  Result Value Ref Range   Scan Result 17   Lactate dehydrogenase     Status: None   Collection Time: 03/12/24  2:17 PM  Result Value Ref  Range   LDH 126 98 - 192 U/L    Comment: Performed at [facility], [address], [place], [place] [address]  Comprehensive metabolic panel     Status: Abnormal   Collection Time: 03/12/24  2:17 PM  Result Value Ref Range   Sodium 146 (H) 135 - 145 mmol/L   Potassium 4.5 3.5 - 5.1 mmol/L   Chloride 111 98 - 111 mmol/L   CO2 25 22 - 32 mmol/L   Glucose, Bld 115 (H) 70 - 99 mg/dL    Comment: Glucose reference range applies only to samples taken after fasting for at least 8 hours.   BUN 14 8 - 23 mg/dL   Creatinine, Ser 8.78 0.61 - 1.24 mg/dL   Calcium 9.3 8.9 - 89.6 mg/dL   Total Protein 6.7 6.5 - 8.1 g/dL   Albumin 3.5 3.5 - 5.0 g/dL   AST 25 15 - 41 U/L   ALT 28 0 - 44 U/L   Alkaline Phosphatase 65 38 - 126 U/L   Total Bilirubin 0.9 0.0 - 1.2 mg/dL   GFR, Estimated 58 (L) >60 mL/min    Comment: (NOTE) Calculated using the CKD-EPI Creatinine Equation (2021)    Anion gap 10 5 - 15    Comment: Performed at [facility], [address] [place], [place] [address]  CBC with Differential     Status: Abnormal   Collection Time: 03/12/24  2:17 PM  Result Value Ref Range   WBC 18.0 (H) 4.0 - 10.5 K/uL   RBC 4.52 4.22 - 5.81 MIL/uL   Hemoglobin 14.4 13.0 - 17.0  g/dL   HCT 56.4 60.9 - 47.9 %   MCV 96.2 80.0 - 100.0 fL   MCH 31.9 26.0 - 34.0 pg   MCHC 33.1 30.0 - 36.0 g/dL   RDW 87.3 88.4 - 84.4 %   Platelets 372 150 - 400 K/uL   nRBC 0.0 0.0 - 0.2 %   Neutrophils Relative % 38 %   Neutro Abs 6.7 1.7 - 7.7 K/uL   Lymphocytes Relative 53 %   Lymphs Abs 9.5 (H) 0.7 - 4.0 K/uL   Monocytes Relative 7 %   Monocytes Absolute 1.3 (H) 0.1 - 1.0 K/uL   Eosinophils Relative 2 %   Eosinophils Absolute 0.3 0.0 - 0.5 K/uL   Basophils Relative 0 %   Basophils Absolute 0.1 0.0 - 0.1 K/uL   WBC Morphology      Lymphocytosis with morphology consistent with known diagnosis of CLL.   RBC Morphology MORPHOLOGY UNREMARKABLE    Smear Review MORPHOLOGY UNREMARKABLE    Immature Granulocytes 0 %   Abs Immature Granulocytes 0.06 0.00 - 0.07 K/uL   Reactive, Benign Lymphocytes PRESENT     Comment: Performed at [facility], [address], [place], [place] [address]      Assessment & Plan:   Problem List Items Addressed This Visit   None   No follow-ups on file.   Total time spent: 25 minutes  Neale Carpen, NP  03/22/2024   This document may have been prepared by [facility] Voice Recognition software and as such may include unintentional dictation errors.

## 2024-03-22 NOTE — Patient Instructions (Signed)
 1) Follow up appt in 3 months, fasting labs 2) Pulmonology referral - episodic cough at night

## 2024-03-26 ENCOUNTER — Telehealth: Payer: Self-pay

## 2024-03-26 DIAGNOSIS — R32 Unspecified urinary incontinence: Secondary | ICD-10-CM

## 2024-03-26 NOTE — Telephone Encounter (Signed)
 Pt called and advised gemtesa  has no generic brand currently but a message will be sent to provider for advisement on different Rx if possible

## 2024-03-27 MED ORDER — MIRABEGRON ER 25 MG PO TB24: 25.0000 mg | ORAL_TABLET | Freq: Every day | ORAL | 11 refills | Status: AC

## 2024-03-27 NOTE — Telephone Encounter (Signed)
 Called to let pt know MD McKenzie changed his Rx to Mirabegron  25 pt voiced his understanding with a thank you

## 2024-03-27 NOTE — Addendum Note (Signed)
 Addended by: SAMMIE EXIE HERO on: 03/27/2024 12:12 PM   Modules accepted: Orders

## 2024-04-14 ENCOUNTER — Other Ambulatory Visit: Payer: Self-pay | Admitting: Internal Medicine

## 2024-04-14 DIAGNOSIS — J209 Acute bronchitis, unspecified: Secondary | ICD-10-CM

## 2024-04-20 ENCOUNTER — Other Ambulatory Visit: Payer: Self-pay | Admitting: Internal Medicine

## 2024-04-20 ENCOUNTER — Other Ambulatory Visit: Payer: Self-pay | Admitting: Nurse Practitioner

## 2024-04-20 DIAGNOSIS — F339 Major depressive disorder, recurrent, unspecified: Secondary | ICD-10-CM

## 2024-04-20 MED ORDER — CITALOPRAM HYDROBROMIDE 10 MG PO TABS: 10.0000 mg | ORAL_TABLET | Freq: Every day | ORAL | 5 refills | Status: DC

## 2024-04-27 ENCOUNTER — Other Ambulatory Visit: Payer: Self-pay | Admitting: Nurse Practitioner

## 2024-04-27 DIAGNOSIS — F339 Major depressive disorder, recurrent, unspecified: Secondary | ICD-10-CM

## 2024-04-27 MED ORDER — CITALOPRAM HYDROBROMIDE 10 MG PO TABS: 10.0000 mg | ORAL_TABLET | Freq: Every day | ORAL | 5 refills | Status: DC

## 2024-05-01 ENCOUNTER — Encounter (HOSPITAL_COMMUNITY): Payer: Self-pay | Admitting: Hematology & Oncology

## 2024-05-01 NOTE — Progress Notes (Unsigned)
 New Patient Pulmonology Office Visit   Subjective:  Patient ID: David Pugh., male    DOB: 11/23/1934  MRN: [id number]  Referred by: Glennon Sand, NP  CC: No chief complaint on file.   HPI David Pugh. is a 89 y.o. male with PMH significant for CLL who presents for initial evaluation of chronic cough.   PMH:  - Hx of TB ***  - Hx of endemic fungal infections ***  - Hx of Sarcoidosis ***  - Hx of CTD ***  - Hx of lymphoma or other hematological malignancies ***  - Hx of irradiation to the chest or immunotherapy ***   PSH:   Medications:   Allergies:   Social Hx: - Smoking ***  - Second hand smoking ***  - Vaping ***  - Alcohol ***  - Illicit substances ***  - Indoor emission from household combustion ***  - Hobbies (e.g. wood work, Surveyor, quantity, bird breeding etc...)  - Landscape architect (e.g. mold) ***  - Pets ***  - Birds ***   Occupational exposures:  [ ]  Asbestos - Holiday representative workers, Medical sales representative, Therapist, sports, railroad, English as a second language teacher  [ ]  Ionizing radiation - uranium mining  [ ]  Vinyl chloride - pulp & paper workers  [ ]  Arsenic - smelting of ores, Chief Executive Officer, Technical sales engineer  [ ]  Beryllium - Archivist workers, Geophysical data processor, Pensions consultant workers, Health and safety inspector  [ ]  chromium - Financial trader, welding, tanning industries  [ ]  Nickel - Chief Strategy Officer, Naval architect, Physiological scientist, glass workers, Health and safety inspector, metal workers   Family Hx:  - Lung disease ***  - CTD ***  - Sarcoidosis ***  - Cancer ***    {PULM QUESTIONNAIRES (Optional):33196}  ROS  Allergies: Shellfish allergy and Sulfonamide derivatives  Current Outpatient Medications:    acetaminophen  (TYLENOL ) 500 MG tablet, Take 500 mg by mouth every 6 (six) hours as needed for mild pain (pain score 1-3)., Disp: , Rfl:    Apoaequorin (PREVAGEN EXTRA STRENGTH) 20 MG CAPS, Take 1 capsule by mouth daily., Disp: , Rfl:    Azelastine -Fluticasone  137-50  MCG/ACT SUSP, Place 1 spray into the nose every 12 (twelve) hours., Disp: 23 g, Rfl: 1   B Complex Vitamins (VITAMIN B COMPLEX  PO), Take 1 tablet by mouth at bedtime. , Disp: , Rfl:    cholecalciferol  (VITAMIN D3) 25 MCG (1000 UNIT) tablet, Take 1,000 Units by mouth 2 (two) times daily., Disp: , Rfl:    citalopram  (CELEXA ) 10 MG tablet, Take 1 tablet (10 mg total) by mouth daily., Disp: 30 tablet, Rfl: 5   clopidogrel  (PLAVIX ) 75 MG tablet, take 1 tablet by mouth once daily., Disp: 30 tablet, Rfl: 11   folic acid  (FOLVITE ) 1 MG tablet, TAKE (1) TABLET BY MOUTH ONCE DAILY . (Patient taking differently: Take 1 mg by mouth daily.), Disp: 30 tablet, Rfl: 0   latanoprost  (XALATAN ) 0.005 % ophthalmic solution, 1 DROP IN EACH EYE AT BEDTIME., Disp: 2.5 mL, Rfl: 12   losartan  (COZAAR ) 50 MG tablet, Take 25 mg by mouth 2 (two) times daily. , Disp: , Rfl:    memantine  (NAMENDA ) 10 MG tablet, Take 1 tablet (10 mg total) by mouth 2 (two) times daily., Disp: 180 tablet, Rfl: 3   metoprolol  (LOPRESSOR ) 50 MG tablet, Take 50 mg by mouth 2 (two) times daily., Disp: , Rfl:    mirabegron  ER (MYRBETRIQ ) 25 MG TB24 tablet, Take 1 tablet (25 mg total) by mouth daily., Disp: 30 tablet, Rfl: 11  montelukast  (SINGULAIR ) 10 MG tablet, Take 10 mg by mouth at bedtime., Disp: , Rfl:    Multiple Vitamin (MULTIVITAMIN WITH MINERALS) TABS tablet, Take 1 tablet by mouth daily., Disp: , Rfl:    nitroGLYCERIN  (NITROSTAT ) 0.4 MG SL tablet, Place 1 tablet (0.4 mg total) under the tongue every 5 (five) minutes x 3 doses as needed for chest pain (if no relief after 3rd dose, proceed to ED or call 911)., Disp: 25 tablet, Rfl: 3   pantoprazole  (PROTONIX ) 40 MG tablet, TAKE (1) TABLET TWICE DAILY. (Patient taking differently: Take 40 mg by mouth daily.), Disp: 60 tablet, Rfl: 5   pravastatin  (PRAVACHOL ) 40 MG tablet, Take 1 tablet (40 mg total) by mouth at bedtime., Disp: 90 tablet, Rfl: 3   rivastigmine  (EXELON ) 1.5 MG capsule, Take 1  capsule (1.5 mg total) by mouth 2 (two) times daily., Disp: 180 capsule, Rfl: 3   vitamin B-12 (CYANOCOBALAMIN ) 1000 MCG tablet, Take 1,000 mcg by mouth daily., Disp: , Rfl:  Past Medical History:  Diagnosis Date   Abnormality of gait 12/18/2013   Arthritis    Basal cell carcinoma of scalp and skin of neck    Bilateral foot-drop 04/02/2019   Biliary dyskinesia    CAD (coronary artery disease) 2012   a. DES x2 in ramus/OM and mid LAD in 2012  b. balloon angioplaty of OM 2015   CLL (chronic lymphocytic leukemia) (HCC)    Colon cancer (HCC) 2013   Right hemicolectomy, did not tolerate chemotherapy   Colon polyps    tubular adenomas   Diverticula of colon 2010   Essential hypertension    Exposure to Agent Orange    Folic acid  deficiency    Gastroparesis 2014   GERD (gastroesophageal reflux disease)    Glaucoma    Heart palpitations 2014   Hiatal hernia 2010   History of anemia as a child    History of kidney stones    Iron  deficiency 2013   Myocardial infarction [facility]) 2012   Peripheral neuropathy    Prostate cancer (HCC) 2010   Radiation, surgery   PTSD (post-traumatic stress disorder)    Radiation proctitis    Rosacea    Past Surgical History:  Procedure Laterality Date   CARDIAC CATHETERIZATION  02/13/2011   [facility] cardiology   CATARACT EXTRACTION     2011   CATARACT EXTRACTION W/PHACO Right 08/06/2013   Procedure: CATARACT EXTRACTION PHACO AND INTRAOCULAR LENS PLACEMENT (IOC);  Surgeon: Cherene Mania, MD;  Location: [facility];  Service: Ophthalmology;  Laterality: Right;  CDE:20.96   CHOLECYSTECTOMY     COLONOSCOPY  02/12/2004   Dr. Shaaron- L side diverticula, inflammatory  colon polyps   COLONOSCOPY  01/19/2012   RMR: Prominent changes involving the rectal mucosa consistent with radiation-induced proctitis. Multiple colonic  polyps removed as described above. Sigmoid Diverticulosis/ 1.5 x 2 cm relatively flat ulcerated lesion in cecum/path showed adenocarcinoma of the  colon. descending colon polyp with tubular adenoma and one fragment of focal high grade dysplasia.    COLONOSCOPY  08/28/2012   MFM:Mjipjupnw proctitis. Colonic diverticulosis/Ulcerations at the surgical anastomosis  path: ulcerated colonic mucosa with prolapse changes, tubular adenoma.    COLONOSCOPY N/A 08/02/2013   RMR: Colonic polyps -removed as described above. Colonic diverticulosis. Status post reight hemicolectomy. Radiation proctitis- asymptomatic.    COLONOSCOPY N/A 05/09/2015   Procedure: COLONOSCOPY;  Surgeon: Lamar CHRISTELLA Shaaron, MD;  Location: [facility];  Service: Endoscopy;  Laterality: N/A;  1200-moved to [address]5 [address] to  notify pt   COLONOSCOPY WITH PROPOFOL  N/A 11/10/2017   Procedure: COLONOSCOPY WITH PROPOFOL ;  Surgeon: Shaaron Lamar HERO, MD;  Location: [facility];  Service: Endoscopy;  Laterality: N/A;   CORONARY ANGIOPLASTY  06/17/14   OM1 POBA   CORONARY STENT PLACEMENT  01/2011   2.5x59mm Xience drug-eluting stent in ramus intermedius/OMI vessel, 2.5x12mm Promus Element stent in mid LAD artery   ESOPHAGOGASTRODUODENOSCOPY  10/10/2008   Dr. Kathyleen hiatal hernia, normal esophagus, normal stomach   ESOPHAGOGASTRODUODENOSCOPY  02/12/2004   Dr. Tyra- normal    ESOPHAGOGASTRODUODENOSCOPY N/A 05/09/2015   Procedure: ESOPHAGOGASTRODUODENOSCOPY (EGD);  Surgeon: Lamar HERO Shaaron, MD;  [facility]: [facility];  Service: Endoscopy;  Laterality: N/A;   ESOPHAGOGASTRODUODENOSCOPY (EGD) WITH PROPOFOL  N/A 11/10/2017   Procedure: ESOPHAGOGASTRODUODENOSCOPY (EGD) WITH PROPOFOL ;  Surgeon: Shaaron Lamar HERO, MD;  Location: [facility];  Service: Endoscopy;  Laterality: N/A;  9:45am   GIVENS CAPSULE STUDY N/A 06/03/2015   Procedure: GIVENS CAPSULE STUDY;  Surgeon: Lamar HERO Shaaron, MD;  Location: [facility];  Service: Endoscopy;  Laterality: N/A;  0700   HOT HEMOSTASIS  11/10/2017   Procedure: HOT HEMOSTASIS (ARGON PLASMA COAGULATION/BICAP);  Surgeon: Shaaron Lamar HERO, MD;  Location: [facility];   Service: Endoscopy;;  rectum   KNEE SURGERY     left knee    LEFT HEART CATHETERIZATION WITH CORONARY ANGIOGRAM N/A 06/17/2014   Procedure: LEFT HEART CATHETERIZATION WITH CORONARY ANGIOGRAM;  Surgeon: Lonni JONETTA Cash, MD;  Location: [facility] CATH LAB;  Service: Cardiovascular;  Laterality: N/A;   PARTIAL COLECTOMY  02/29/2012   Procedure: PARTIAL COLECTOMY;  Surgeon: Elon HERO Pacini, MD;  Location: [facility];  Service: General;  Laterality: Right;   POLYPECTOMY  11/10/2017   Procedure: POLYPECTOMY;  Surgeon: Shaaron Lamar HERO, MD;  Location: [facility];  Service: Endoscopy;;  gastric colon   PROSTATECTOMY     TONSILLECTOMY     TRANSURETHRAL RESECTION OF PROSTATE     VASECTOMY     Family History  Problem Relation Age of Onset   Throat cancer Father        dx in his 23s and again in his 35s; pipe and cigar smoker   Neuropathy Brother    Prostate cancer Brother 75   Melanoma Brother        dx in his 67s   Cancer Paternal Uncle        smoking related cancer   Heart attack Paternal Grandfather    Stomach cancer Cousin 72   Colon cancer Neg Hx    Social History   Socioeconomic History   Marital status: Married    Spouse name: Not on file   Number of children: 3   Years of education: military   Highest education level: Not on file  Occupational History   Occupation: Retired    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  Tobacco Use   Smoking status: Never   Smokeless tobacco: Never  Vaping Use   Vaping status: Never Used  Substance and Sexual Activity   Alcohol use: Yes    Comment: 1 drink every 6 months   Drug use: No   Sexual activity: Not Currently  Other Topics Concern   Not on file  Social History Narrative   Right handed   Caffeine~1 cup per day    Lives at home with wife    Social Drivers of Health   Financial Resource Strain: Low Risk  (12/15/2022)   Received from [facility]   Overall Financial Resource  Strain (CARDIA)    Difficulty of Paying Living Expenses: Not hard at all   Food Insecurity: No Food Insecurity (08/18/2023)   Hunger Vital Sign    Worried About Running Out of Food in the Last Year: Never true    Ran Out of Food in the Last Year: Never true  Transportation Needs: No Transportation Needs (08/18/2023)   PRAPARE - Administrator, Civil Service (Medical): No    Lack of Transportation (Non-Medical): No  Physical Activity: Inactive (07/14/2020)   Exercise Vital Sign    Days of Exercise per Week: 0 days    Minutes of Exercise per Session: 0 min  Stress: No Stress Concern Present (12/15/2022)   Received from [facility] Occupational Health - Occupational Stress Questionnaire    Feeling of Stress : Not at all  Social Connections: Unknown (01/11/2022)   [facility]   Social Network    Social Network: Not on file  Intimate Partner Violence: Not At Risk (08/18/2023)   Humiliation, Afraid, Rape, and Kick questionnaire    Fear of Current or Ex-Partner: No    Emotionally Abused: No    Physically Abused: No    Sexually Abused: No       Objective:  There were no vitals taken for this visit. {Pulm Vitals (Optional):32837}  Physical Exam  Diagnostic Review:  {Labs (Optional):32838}     Assessment & Plan:   Assessment & Plan   No orders of the defined types were placed in this encounter.     No follow-ups on file.   Ebony Rickel, MD

## 2024-05-02 ENCOUNTER — Encounter: Payer: Self-pay | Admitting: Pulmonary Disease

## 2024-05-02 ENCOUNTER — Telehealth: Payer: Self-pay | Admitting: Pulmonary Disease

## 2024-05-02 ENCOUNTER — Ambulatory Visit: Admitting: Pulmonary Disease

## 2024-05-02 VITALS — BP 136/74 | HR 82 | Ht 70.0 in | Wt 199.4 lb

## 2024-05-02 DIAGNOSIS — Z23 Encounter for immunization: Secondary | ICD-10-CM | POA: Diagnosis not present

## 2024-05-02 DIAGNOSIS — J301 Allergic rhinitis due to pollen: Secondary | ICD-10-CM | POA: Diagnosis not present

## 2024-05-02 DIAGNOSIS — R053 Chronic cough: Secondary | ICD-10-CM

## 2024-05-02 DIAGNOSIS — K219 Gastro-esophageal reflux disease without esophagitis: Secondary | ICD-10-CM | POA: Diagnosis not present

## 2024-05-02 MED ORDER — FAMOTIDINE 20 MG PO TABS: 20.0000 mg | ORAL_TABLET | Freq: Two times a day (BID) | ORAL | 6 refills | Status: AC

## 2024-05-02 MED ORDER — CETIRIZINE HCL 10 MG PO CHEW: 10.0000 mg | CHEWABLE_TABLET | Freq: Every day | ORAL | 3 refills | Status: AC

## 2024-05-02 NOTE — Assessment & Plan Note (Signed)
-   Switched PPI to H2 Blocker Discussed GERD lifestyle modifications which include: - Avoid lying down for at least two hours after a meal or after drinking acidic beverages, like soda, or other caffeinated beverages. - Keep your head elevated while you sleep. Using an extra pillow or two can also help to prevent reflux. - Eat smaller and more frequent meals each day instead of a few large meals. - Quit smoking. - Reduce excess weight around the midsection

## 2024-05-02 NOTE — Telephone Encounter (Signed)
 Spoke with patient regarding the Thursday 07/05/24 10:00 am PFT appointment at [facility] time is 9:45 am--1st floor registration desk for check in.  Will mail instructions and information to patient and he voiced his understanding

## 2024-05-02 NOTE — Patient Instructions (Signed)
-   Stop Omeprazole  and start famotidine  20 mg twice a day - Stop Singulair  and start Cetirizine  10 mg daily - Avoid laying flat completely, purchase wedge - Discussed GERD lifestyle modifications which include: - Avoid lying down for at least two hours after a meal or after drinking acidic beverages, like soda, or other caffeinated beverages. - Keep your head elevated while you sleep. Using an extra pillow or two can also help to prevent reflux. - Eat smaller and more frequent meals each day instead of a few large meals. - Quit smoking. - Reduce excess weight around the midsection

## 2024-05-02 NOTE — Assessment & Plan Note (Signed)
 Switched Montelukast  to Cetrizine 10 mg daily.

## 2024-05-03 ENCOUNTER — Ambulatory Visit (HOSPITAL_COMMUNITY)
Admission: RE | Admit: 2024-05-03 | Discharge: 2024-05-03 | Disposition: A | Discharge status: Home / Self Care | Source: Ambulatory Visit | Attending: Pulmonary Disease | Admitting: Pulmonary Disease

## 2024-05-03 DIAGNOSIS — R053 Chronic cough: Secondary | ICD-10-CM | POA: Diagnosis present

## 2024-05-14 ENCOUNTER — Other Ambulatory Visit: Payer: Self-pay | Admitting: Nurse Practitioner

## 2024-05-16 ENCOUNTER — Ambulatory Visit: Payer: Self-pay | Admitting: Pulmonary Disease

## 2024-05-16 DIAGNOSIS — J4520 Mild intermittent asthma, uncomplicated: Secondary | ICD-10-CM

## 2024-06-06 ENCOUNTER — Encounter: Payer: Self-pay | Admitting: Cardiology

## 2024-06-06 ENCOUNTER — Ambulatory Visit: Attending: Cardiology | Admitting: Cardiology

## 2024-06-06 VITALS — BP 128/74 | HR 60 | Ht 70.5 in | Wt 204.2 lb

## 2024-06-06 DIAGNOSIS — I10 Essential (primary) hypertension: Secondary | ICD-10-CM | POA: Diagnosis not present

## 2024-06-06 DIAGNOSIS — I25119 Atherosclerotic heart disease of native coronary artery with unspecified angina pectoris: Secondary | ICD-10-CM | POA: Diagnosis not present

## 2024-06-06 DIAGNOSIS — E782 Mixed hyperlipidemia: Secondary | ICD-10-CM | POA: Diagnosis not present

## 2024-06-06 NOTE — Progress Notes (Signed)
    Cardiology Office Note  Date: 06/06/2024   ID: David DELENA Edsel Mickey., DOB 1935/05/23, MRN [id number]  History of Present Illness: David Loyal. is an 89 y.o. male last seen in February.  He is here today with caregiver for a routine visit.  He does not report any angina or interval nitroglycerin  use.  Using a cane or walker to ambulate.  He does not report any palpitations or frank syncope.  I reviewed his medications which are stable from a cardiac perspective.  He does not report any intolerances.  Last lipid panel showed LDL 58.  His blood pressure is adequately controlled today.  He does track this at home as well.  Still following regularly with PCP.  Physical Exam: VS:  BP 128/74 (BP Location: Left Arm)   Pulse 60   Ht 5' 10.5 (1.791 m)   Wt 204 lb 3.2 oz (92.6 kg)   SpO2 97%   BMI 28.89 kg/m , BMI Body mass index is 28.89 kg/m.  Wt Readings from Last 3 Encounters:  06/06/24 204 lb 3.2 oz (92.6 kg)  05/02/24 199 lb 6.4 oz (90.4 kg)  03/22/24 203 lb (92.1 kg)    General: Patient appears comfortable at rest. HEENT: Conjunctiva and lids normal. Neck: Supple, no elevated JVP or carotid bruits. Lungs: Clear to auscultation, nonlabored breathing at rest. Cardiac: Regular rate and rhythm, no S3, 1/6 systolic murmur. Extremities: No pitting edema.  ECG:  An ECG dated 08/18/2023 was personally reviewed today and demonstrated:  Sinus rhythm with IVCD and nonspecific T wave changes.  Labwork: 07/15/2023: TSH 2.360 08/20/2023: Magnesium 2.0 03/12/2024: ALT 28; AST 25; BUN 14; Creatinine, Ser 1.21; Hemoglobin 14.4; Platelets 372; Potassium 4.5; Sodium 146   Other Studies Reviewed Today:  No interval cardiac testing for review today.  Assessment and Plan:  1.  CAD status post inferior infarct in 2012 managed with DES x 2 to the ramus intermedius/OM and mid LAD.  Subsequently underwent balloon angioplasty of the OM in 2015.  He is clinically stable with no interval  angina or nitroglycerin  use.  Continue Plavix  75 mg daily, Pravachol  40 mg daily, and as needed nitroglycerin .   2.  Primary hypertension.  Blood pressure control is adequate today.  Continue Lopressor  50 mg twice daily and Cozaar  50 mg daily.   3.  Mixed hyperlipidemia.  LDL 58 and HDL 61 in April 2024.  Continue Pravachol  40 mg daily.  Disposition:  Follow up 6 months.  Signed, Jayson JUDITHANN Sierras, M.D., F.A.C.C. [facility]

## 2024-06-06 NOTE — Patient Instructions (Signed)
 Medication Instructions:  Your physician recommends that you continue on your current medications as directed. Please refer to the Current Medication list given to you today.   Labwork: None today  Testing/Procedures: None today  Follow-Up: 6 months  Any Other Special Instructions Will Be Listed Below (If Applicable).  If you need a refill on your cardiac medications before your next appointment, please call your pharmacy.

## 2024-06-14 ENCOUNTER — Other Ambulatory Visit: Payer: Self-pay | Admitting: Family Medicine

## 2024-07-05 ENCOUNTER — Ambulatory Visit (HOSPITAL_COMMUNITY)
Admission: RE | Admit: 2024-07-05 | Discharge: 2024-07-05 | Disposition: A | Discharge status: Home / Self Care | Source: Ambulatory Visit | Attending: Pulmonary Disease | Admitting: Pulmonary Disease

## 2024-07-05 DIAGNOSIS — R053 Chronic cough: Secondary | ICD-10-CM | POA: Diagnosis present

## 2024-07-05 LAB — PULMONARY FUNCTION TEST
DL/VA % pred: 86 %
DL/VA: 3.22 ml/min/mmHg/L
DLCO unc % pred: 85 %
DLCO unc: 20.09 ml/min/mmHg
FEF 25-75 Post: 2.67 L/s
FEF 25-75 Pre: 2.1 L/s
FEF2575-%Change-Post: 27 %
FEF2575-%Pred-Post: 159 %
FEF2575-%Pred-Pre: 125 %
FEV1-%Change-Post: 11 %
FEV1-%Pred-Post: 103 %
FEV1-%Pred-Pre: 93 %
FEV1-Post: 2.74 L
FEV1-Pre: 2.46 L
FEV1FVC-%Change-Post: -1 %
FEV1FVC-%Pred-Pre: 103 %
FEV6-%Change-Post: 15 %
FEV6-%Pred-Post: 108 %
FEV6-%Pred-Pre: 94 %
FEV6-Post: 3.84 L
FEV6-Pre: 3.32 L
FEV6FVC-%Pred-Post: 108 %
FEV6FVC-%Pred-Pre: 108 %
FVC-%Change-Post: 13 %
FVC-%Pred-Post: 100 %
FVC-%Pred-Pre: 88 %
FVC-Post: 3.84 L
FVC-Pre: 3.38 L
Post FEV1/FVC ratio: 71 %
Post FEV6/FVC ratio: 100 %
Pre FEV1/FVC ratio: 73 %
Pre FEV6/FVC Ratio: 100 %
RV % pred: 110 %
RV: 3.12 L
TLC % pred: 96 %
TLC: 6.9 L

## 2024-07-05 MED ORDER — ALBUTEROL SULFATE (2.5 MG/3ML) 0.083% IN NEBU
2.5000 mg | INHALATION_SOLUTION | Freq: Once | RESPIRATORY_TRACT | Status: AC
  Administered 2024-07-05: 2.5 mg via RESPIRATORY_TRACT

## 2024-07-09 ENCOUNTER — Encounter: Payer: Self-pay | Admitting: Internal Medicine

## 2024-07-09 ENCOUNTER — Ambulatory Visit (INDEPENDENT_AMBULATORY_CARE_PROVIDER_SITE_OTHER): Admitting: Internal Medicine

## 2024-07-09 VITALS — BP 132/76 | HR 60 | Ht 70.5 in | Wt 206.8 lb

## 2024-07-09 DIAGNOSIS — R413 Other amnesia: Secondary | ICD-10-CM

## 2024-07-09 DIAGNOSIS — K219 Gastro-esophageal reflux disease without esophagitis: Secondary | ICD-10-CM

## 2024-07-09 DIAGNOSIS — J309 Allergic rhinitis, unspecified: Secondary | ICD-10-CM

## 2024-07-09 DIAGNOSIS — I10 Essential (primary) hypertension: Secondary | ICD-10-CM | POA: Diagnosis not present

## 2024-07-09 DIAGNOSIS — R7303 Prediabetes: Secondary | ICD-10-CM | POA: Insufficient documentation

## 2024-07-09 DIAGNOSIS — F339 Major depressive disorder, recurrent, unspecified: Secondary | ICD-10-CM | POA: Insufficient documentation

## 2024-07-09 DIAGNOSIS — F4310 Post-traumatic stress disorder, unspecified: Secondary | ICD-10-CM | POA: Diagnosis not present

## 2024-07-09 DIAGNOSIS — C9110 Chronic lymphocytic leukemia of B-cell type not having achieved remission: Secondary | ICD-10-CM | POA: Diagnosis not present

## 2024-07-09 DIAGNOSIS — G609 Hereditary and idiopathic neuropathy, unspecified: Secondary | ICD-10-CM

## 2024-07-09 DIAGNOSIS — E559 Vitamin D deficiency, unspecified: Secondary | ICD-10-CM

## 2024-07-09 DIAGNOSIS — I25118 Atherosclerotic heart disease of native coronary artery with other forms of angina pectoris: Secondary | ICD-10-CM

## 2024-07-09 MED ORDER — LOSARTAN POTASSIUM 50 MG PO TABS: 50.0000 mg | ORAL_TABLET | Freq: Every day | ORAL | 3 refills | Status: AC

## 2024-07-09 MED ORDER — PANTOPRAZOLE SODIUM 40 MG PO TBEC: 40.0000 mg | DELAYED_RELEASE_TABLET | Freq: Every day | ORAL | 3 refills | Status: AC

## 2024-07-09 MED ORDER — MONTELUKAST SODIUM 10 MG PO TABS: 10.0000 mg | ORAL_TABLET | Freq: Every day | ORAL | 3 refills | Status: AC

## 2024-07-09 MED ORDER — METOPROLOL TARTRATE 50 MG PO TABS: 50.0000 mg | ORAL_TABLET | Freq: Two times a day (BID) | ORAL | 3 refills | Status: AC

## 2024-07-09 MED ORDER — CITALOPRAM HYDROBROMIDE 20 MG PO TABS: 20.0000 mg | ORAL_TABLET | Freq: Every day | ORAL | 1 refills | Status: AC

## 2024-07-09 NOTE — Assessment & Plan Note (Signed)
 Stage 0 CLL.  Diagnosed in 2016.  Followed by oncology.  Not on any active treatment.  Oncology follow-up is scheduled for January.

## 2024-07-09 NOTE — Assessment & Plan Note (Signed)
 Mild cognitive impairment attributed to Alzheimer disease based on ATN profile from 10/2022 On Namenda  and Exelon  Followed by neurology

## 2024-07-09 NOTE — Assessment & Plan Note (Addendum)
 Has chronic numbness of bilateral feet Has chronic gait disturbance Uses cane or walker as walking support, advised to use it indoors as well to prevent falls at home

## 2024-07-09 NOTE — Assessment & Plan Note (Addendum)
 Lab Results  Component Value Date   HGBA1C 5.9 (H) 11/25/2013   Advised to follow DASH diet

## 2024-07-09 NOTE — Assessment & Plan Note (Addendum)
 Has coexistent PTSD Increased dose of Celexa  to 20 mg QD due to worsening of PTSD symptoms recently

## 2024-07-09 NOTE — Assessment & Plan Note (Signed)
 His chronic cough is likely due to postnasal drip Continue Zyrtec  10 mg QD Followed by pulmonology Advised to take Mucinex  as needed for cough Advised to try local honey for allergy relief as well

## 2024-07-09 NOTE — Progress Notes (Signed)
 Established Patient Office Visit  Subjective:  Patient ID: David Rog., male    DOB: 1935/03/04  Age: 89 y.o. MRN: [id number]  CC:  Chief Complaint  Patient presents with   Coronary Artery Disease    6 month f/u    Hypertension    6 month f/u    Gastroesophageal Reflux    6 month f/u , previously seen by [facility].   Post-Traumatic Stress Disorder    Has used celexa  for long term went from 20 mg to 10 mg would like to go back up to 20 mg.     HPI David Pugh. is a 89 y.o. male with past medical history of HTN, CAD, GERD, colon cancer, prostate cancer, CLL, urge incontinence, cognitive impairment and PTSD who presents for f/u of his chronic medical conditions.  CAD and HTN: BP is well-controlled. Takes medications regularly. Patient denies headache, dizziness, chest pain, dyspnea or palpitations.  Has a history of stent placement for CAD in 2012.  On Plavix  and statin currently.  Followed by cardiology.  GERD: He takes pantoprazole  40 mg QD.  Denies nausea or vomiting currently.  PTSD: He was in [place] for 3 months, and has had anxiety since then.  He is currently taking Celexa  10 mg QD, but reports recent worsening of anxiety.  Denies any major changes in his surroundings.  He used to take Celexa  20 mg QD, but had stopped taking it last year, after which he started having severe anxiety and shaking movements.  He was placed on Celexa  10 mg later.  Allergic sinusitis: He has postnasal drip and cough with mucoid expectoration.  He has been evaluated by pulmonology, has been placed on Zyrtec .  Denies any fever, chills, dyspnea or wheezing currently.  No smoking history.   Past Medical History:  Diagnosis Date   Abnormality of gait 12/18/2013   Arthritis    Basal cell carcinoma of scalp and skin of neck    Bilateral foot-drop 04/02/2019   Biliary dyskinesia    CAD (coronary artery disease) 2012   a. DES x2 in ramus/OM and mid LAD in 2012  b. balloon angioplaty  of OM 2015   CLL (chronic lymphocytic leukemia) (HCC)    Colon cancer (HCC) 2013   Right hemicolectomy, did not tolerate chemotherapy   Colon polyps    tubular adenomas   Diverticula of colon 2010   Essential hypertension    Exposure to Agent Orange    Folic acid  deficiency    Gastroparesis 2014   GERD (gastroesophageal reflux disease)    Glaucoma    Heart palpitations 2014   Hiatal hernia 2010   History of anemia as a child    History of kidney stones    Iron  deficiency 2013   Myocardial infarction [facility]) 2012   Peripheral neuropathy    Prostate cancer (HCC) 2010   Radiation, surgery   PTSD (post-traumatic stress disorder)    Radiation proctitis    Rosacea     Past Surgical History:  Procedure Laterality Date   CARDIAC CATHETERIZATION  02/13/2011   [facility], [facility] cardiology   CATARACT EXTRACTION     2011   CATARACT EXTRACTION W/PHACO Right 08/06/2013   Procedure: CATARACT EXTRACTION PHACO AND INTRAOCULAR LENS PLACEMENT (IOC);  Surgeon: Cherene Mania, MD;  Location: [facility];  Service: Ophthalmology;  Laterality: Right;  CDE:20.96   CHOLECYSTECTOMY     COLONOSCOPY  02/12/2004   Dr. Shaaron- L side diverticula, inflammatory  colon polyps  COLONOSCOPY  01/19/2012   RMR: Prominent changes involving the rectal mucosa consistent with radiation-induced proctitis. Multiple colonic  polyps removed as described above. Sigmoid Diverticulosis/ 1.5 x 2 cm relatively flat ulcerated lesion in cecum/path showed adenocarcinoma of the colon. descending colon polyp with tubular adenoma and one fragment of focal high grade dysplasia.    COLONOSCOPY  08/28/2012   MFM:Mjipjupnw proctitis. Colonic diverticulosis/Ulcerations at the surgical anastomosis  path: ulcerated colonic mucosa with prolapse changes, tubular adenoma.    COLONOSCOPY N/A 08/02/2013   RMR: Colonic polyps -removed as described above. Colonic diverticulosis. Status post reight hemicolectomy. Radiation proctitis- asymptomatic.     COLONOSCOPY N/A 05/09/2015   Procedure: COLONOSCOPY;  Surgeon: Lamar CHRISTELLA Hollingshead, MD;  Location: [facility];  Service: Endoscopy;  Laterality: N/A;  1200-moved to [address]5 [address] to notify pt   COLONOSCOPY WITH PROPOFOL  N/A 11/10/2017   Procedure: COLONOSCOPY WITH PROPOFOL ;  Surgeon: Hollingshead Lamar CHRISTELLA, MD;  Location: [facility];  Service: Endoscopy;  Laterality: N/A;   CORONARY ANGIOPLASTY  06/17/14   OM1 POBA   CORONARY STENT PLACEMENT  01/2011   2.5x45mm Xience drug-eluting stent in ramus intermedius/OMI vessel, 2.5x54mm Promus Element stent in mid LAD artery   ESOPHAGOGASTRODUODENOSCOPY  10/10/2008   Dr. Kathyleen hiatal hernia, normal esophagus, normal stomach   ESOPHAGOGASTRODUODENOSCOPY  02/12/2004   Dr. Tyra- normal    ESOPHAGOGASTRODUODENOSCOPY N/A 05/09/2015   Procedure: ESOPHAGOGASTRODUODENOSCOPY (EGD);  Surgeon: Lamar CHRISTELLA Hollingshead, MD;  Location: [facility];  Service: Endoscopy;  Laterality: N/A;   ESOPHAGOGASTRODUODENOSCOPY (EGD) WITH PROPOFOL  N/A 11/10/2017   Procedure: ESOPHAGOGASTRODUODENOSCOPY (EGD) WITH PROPOFOL ;  Surgeon: Hollingshead Lamar CHRISTELLA, MD;  Location: [facility];  Service: Endoscopy;  Laterality: N/A;  9:45am   GIVENS CAPSULE STUDY N/A 06/03/2015   Procedure: GIVENS CAPSULE STUDY;  Surgeon: Lamar CHRISTELLA Hollingshead, MD;  Location: [facility];  Service: Endoscopy;  Laterality: N/A;  0700   HOT HEMOSTASIS  11/10/2017   Procedure: HOT HEMOSTASIS (ARGON PLASMA COAGULATION/BICAP);  Surgeon: Hollingshead Lamar CHRISTELLA, MD;  Location: [facility];  Service: Endoscopy;;  rectum   KNEE SURGERY     left knee    LEFT HEART CATHETERIZATION WITH CORONARY ANGIOGRAM N/A 06/17/2014   Procedure: LEFT HEART CATHETERIZATION WITH CORONARY ANGIOGRAM;  Surgeon: Lonni JONETTA Cash, MD;  Location: [facility] CATH LAB;  Service: Cardiovascular;  Laterality: N/A;   PARTIAL COLECTOMY  02/29/2012   Procedure: PARTIAL COLECTOMY;  Surgeon: Elon CHRISTELLA Pacini, MD;  Location: [facility];  Service: General;  Laterality: Right;   POLYPECTOMY   11/10/2017   Procedure: POLYPECTOMY;  Surgeon: Hollingshead Lamar CHRISTELLA, MD;  Location: [facility];  Service: Endoscopy;;  gastric colon   PROSTATECTOMY     TONSILLECTOMY     TRANSURETHRAL RESECTION OF PROSTATE     VASECTOMY      Family History  Problem Relation Age of Onset   Throat cancer Father        dx in his 33s and again in his 7s; pipe and cigar smoker   Neuropathy Brother    Prostate cancer Brother 86   Melanoma Brother        dx in his 28s   Cancer Paternal Uncle        smoking related cancer   Heart attack Paternal Grandfather    Stomach cancer Cousin 55   Colon cancer Neg Hx     Social History   Socioeconomic History   Marital status: Married    Spouse name: Not on file  Number of children: 3   Years of education: military   Highest education level: Not on file  Occupational History   Occupation: Retired    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  Tobacco Use   Smoking status: Never   Smokeless tobacco: Never  Vaping Use   Vaping status: Never Used  Substance and Sexual Activity   Alcohol use: Yes    Comment: 1 drink every 6 months   Drug use: No   Sexual activity: Not Currently  Other Topics Concern   Not on file  Social History Narrative   Right handed   Caffeine~1 cup per day    Lives at home with wife    Social Drivers of Health   Financial Resource Strain: Low Risk (12/15/2022)   Received from [facility]   Overall Financial Resource Strain (CARDIA)    Difficulty of Paying Living Expenses: Not hard at all  Food Insecurity: No Food Insecurity (08/18/2023)   Hunger Vital Sign    Worried About Running Out of Food in the Last Year: Never true    Ran Out of Food in the Last Year: Never true  Transportation Needs: No Transportation Needs (08/18/2023)   PRAPARE - Administrator, Civil Service (Medical): No    Lack of Transportation (Non-Medical): No  Physical Activity: Inactive (07/14/2020)   Exercise Vital Sign    Days of Exercise per Week: 0 days     Minutes of Exercise per Session: 0 min  Stress: No Stress Concern Present (12/15/2022)   Received from [facility] - Occupational Stress Questionnaire    Feeling of Stress : Not at all  Social Connections: Unknown (01/11/2022)   Received from [facility]   Social Network    Social Network: Not on file  Intimate Partner Violence: Not At Risk (08/18/2023)   Humiliation, Afraid, Rape, and Kick questionnaire    Fear of Current or Ex-Partner: No    Emotionally Abused: No    Physically Abused: No    Sexually Abused: No    Outpatient Medications Prior to Visit  Medication Sig Dispense Refill   acetaminophen  (TYLENOL ) 500 MG tablet Take 500 mg by mouth every 6 (six) hours as needed for mild pain (pain score 1-3).     Apoaequorin (PREVAGEN EXTRA STRENGTH) 20 MG CAPS Take 1 capsule by mouth daily.     Azelastine -Fluticasone  137-50 MCG/ACT SUSP Place 1 spray into the nose every 12 (twelve) hours. 23 g 1   B Complex Vitamins (VITAMIN B COMPLEX  PO) Take 1 tablet by mouth at bedtime.      cetirizine  (ZYRTEC ) 10 MG chewable tablet Chew 1 tablet (10 mg total) by mouth daily. 90 tablet 3   cholecalciferol  (VITAMIN D3) 25 MCG (1000 UNIT) tablet Take 1,000 Units by mouth 2 (two) times daily.     clopidogrel  (PLAVIX ) 75 MG tablet take 1 tablet by mouth once daily. 30 tablet 11   famotidine  (PEPCID ) 20 MG tablet Take 1 tablet (20 mg total) by mouth 2 (two) times daily. 180 tablet 6   folic acid  (FOLVITE ) 1 MG tablet TAKE (1) TABLET BY MOUTH ONCE DAILY . 30 tablet 0   latanoprost  (XALATAN ) 0.005 % ophthalmic solution 1 DROP IN EACH EYE AT BEDTIME. 2.5 mL 12   memantine  (NAMENDA ) 10 MG tablet Take 1 tablet (10 mg total) by mouth 2 (two) times daily. 180 tablet 3   mirabegron  ER (MYRBETRIQ ) 25 MG TB24 tablet Take 1 tablet (25  mg total) by mouth daily. 30 tablet 11   Multiple Vitamin (MULTIVITAMIN WITH MINERALS) TABS tablet Take 1 tablet by mouth daily.      nitroGLYCERIN  (NITROSTAT ) 0.4 MG SL tablet Place 1 tablet (0.4 mg total) under the tongue every 5 (five) minutes x 3 doses as needed for chest pain (if no relief after 3rd dose, proceed to ED or call 911). 25 tablet 3   pravastatin  (PRAVACHOL ) 40 MG tablet Take 1 tablet (40 mg total) by mouth at bedtime. 90 tablet 3   rivastigmine  (EXELON ) 1.5 MG capsule Take 1 capsule (1.5 mg total) by mouth 2 (two) times daily. 180 capsule 3   vitamin B-12 (CYANOCOBALAMIN ) 1000 MCG tablet Take 1,000 mcg by mouth daily.     citalopram  (CELEXA ) 10 MG tablet Take 1 tablet (10 mg total) by mouth daily. 30 tablet 5   losartan  (COZAAR ) 50 MG tablet take 1 tablet once daily. 90 tablet 0   metoprolol  tartrate (LOPRESSOR ) 50 MG tablet take (1) tablet twice daily. 180 tablet 0   montelukast  (SINGULAIR ) 10 MG tablet take 1 tablet (10 MILLIGRAM total) by mouth nightly. 90 tablet 0   pantoprazole  (PROTONIX ) 40 MG tablet take 1 tablet every morning on an empty stomach; wait 45 minutes before eating (needs appointment for further refills) 90 tablet 0   No facility-administered medications prior to visit.    Allergies  Allergen Reactions   Shellfish Allergy Anaphylaxis   Sulfonamide Derivatives Diarrhea and Nausea Only    ROS Review of Systems  Constitutional:  Negative for chills and fever.  HENT:  Negative for congestion and sore throat.   Eyes:  Negative for pain and discharge.  Respiratory:  Positive for cough. Negative for shortness of breath.   Cardiovascular:  Negative for chest pain and palpitations.  Gastrointestinal:  Negative for diarrhea, nausea and vomiting.  Endocrine: Negative for polydipsia and polyuria.  Genitourinary:  Negative for dysuria and hematuria.  Musculoskeletal:  Negative for neck pain and neck stiffness.  Skin:  Negative for rash.  Neurological:  Negative for dizziness, weakness, numbness and headaches.  Psychiatric/Behavioral:  Negative for agitation and behavioral problems. The  patient is nervous/anxious.       Objective:    Physical Exam Vitals reviewed.  Constitutional:      General: He is not in acute distress.    Appearance: He is not diaphoretic.  HENT:     Head: Normocephalic and atraumatic.     Nose: Congestion present.     Mouth/Throat:     Mouth: Mucous membranes are moist.  Eyes:     General: No scleral icterus.    Extraocular Movements: Extraocular movements intact.  Cardiovascular:     Rate and Rhythm: Normal rate and regular rhythm.     Heart sounds: Normal heart sounds. No murmur heard. Pulmonary:     Breath sounds: Normal breath sounds. No wheezing or rales.  Abdominal:     Palpations: Abdomen is soft.     Tenderness: There is no abdominal tenderness.  Musculoskeletal:     Cervical back: Neck supple. No tenderness.     Right lower leg: No edema.     Left lower leg: No edema.  Skin:    General: Skin is warm.     Findings: No rash.  Neurological:     General: No focal deficit present.     Mental Status: He is alert and oriented to person, place, and time.     Cranial Nerves: No cranial nerve deficit.  Sensory: No sensory deficit.     Motor: No weakness.  Psychiatric:        Mood and Affect: Mood normal.        Behavior: Behavior normal.     BP 132/76 (BP Location: Left Arm)   Pulse 60   Ht 5' 10.5 (1.791 m)   Wt 206 lb 12.8 oz (93.8 kg)   SpO2 96%   BMI 29.25 kg/m  Wt Readings from Last 3 Encounters:  07/09/24 206 lb 12.8 oz (93.8 kg)  06/06/24 204 lb 3.2 oz (92.6 kg)  05/02/24 199 lb 6.4 oz (90.4 kg)    Lab Results  Component Value Date   TSH 2.360 07/15/2023   Lab Results  Component Value Date   WBC 18.0 (H) 03/12/2024   HGB 14.4 03/12/2024   HCT 43.5 03/12/2024   MCV 96.2 03/12/2024   PLT 372 03/12/2024   Lab Results  Component Value Date   NA 146 (H) 03/12/2024   K 4.5 03/12/2024   CO2 25 03/12/2024   GLUCOSE 115 (H) 03/12/2024   BUN 14 03/12/2024   CREATININE 1.21 03/12/2024   BILITOT 0.9  03/12/2024   ALKPHOS 65 03/12/2024   AST 25 03/12/2024   ALT 28 03/12/2024   PROT 6.7 03/12/2024   ALBUMIN 3.5 03/12/2024   CALCIUM 9.3 03/12/2024   ANIONGAP 10 03/12/2024   EGFR 57 (L) 07/15/2023   Lab Results  Component Value Date   CHOL 133 04/27/2017   Lab Results  Component Value Date   HDL 63 04/27/2017   Lab Results  Component Value Date   LDLCALC 49 04/27/2017   Lab Results  Component Value Date   TRIG 106 04/27/2017   Lab Results  Component Value Date   CHOLHDL 2.1 04/27/2017   Lab Results  Component Value Date   HGBA1C 5.9 (H) 11/25/2013      Assessment & Plan:   Problem List Items Addressed This Visit       Cardiovascular and Mediastinum   CAD (coronary artery disease) (Chronic)   S/p stent placement in 2012 On Plavix , statin and metoprolol  Has nitroglycerin  as needed for chest pain Followed by cardiology      Relevant Medications   metoprolol  tartrate (LOPRESSOR ) 50 MG tablet   losartan  (COZAAR ) 50 MG tablet   Other Relevant Orders   Lipid Profile   Essential hypertension - Primary   BP Readings from Last 1 Encounters:  07/09/24 132/76   Well-controlled with losartan  50 mg QD and metoprolol  50 mg BID Counseled for compliance with the medications Advised DASH diet and moderate exercise/walking as tolerated      Relevant Medications   metoprolol  tartrate (LOPRESSOR ) 50 MG tablet   losartan  (COZAAR ) 50 MG tablet   Other Relevant Orders   CBC with Differential/Platelet   CMP14+EGFR     Respiratory   Seasonal allergic rhinitis   His chronic cough is likely due to postnasal drip Continue Zyrtec  10 mg QD Followed by pulmonology Advised to take Mucinex  as needed for cough Advised to try local honey for allergy relief as well      Relevant Medications   montelukast  (SINGULAIR ) 10 MG tablet     Digestive   GERD   Well-controlled with pantoprazole  40 mg QD      Relevant Medications   pantoprazole  (PROTONIX ) 40 MG tablet      Nervous and Auditory   Peripheral neuropathy   Has chronic numbness of bilateral feet Has chronic gait disturbance Uses cane  or walker as walking support, advised to use it indoors as well to prevent falls at home      Relevant Medications   citalopram  (CELEXA ) 20 MG tablet     Other   CLL (chronic lymphocytic leukemia) (HCC)   Stage 0 CLL.  Diagnosed in 2016.  Followed by oncology.  Not on any active treatment.  Oncology follow-up is scheduled for January.      Relevant Orders   CBC with Differential/Platelet   CMP14+EGFR   Vitamin D  deficiency   Relevant Orders   Vitamin D  (25 hydroxy)   PTSD (post-traumatic stress disorder)   Uncontrolled On Celexa  10 mg QD Increased dose of Celexa  to 20 mg QD      Relevant Medications   citalopram  (CELEXA ) 20 MG tablet   Other Relevant Orders   TSH   Memory impairment   Mild cognitive impairment attributed to Alzheimer disease based on ATN profile from 10/2022 On Namenda  and Exelon  Followed by neurology      Depression, recurrent   Has coexistent PTSD Increased dose of Celexa  to 20 mg QD due to worsening of PTSD symptoms recently      Relevant Medications   citalopram  (CELEXA ) 20 MG tablet   Prediabetes   Lab Results  Component Value Date   HGBA1C 5.9 (H) 11/25/2013   Advised to follow DASH diet      Relevant Orders   CMP14+EGFR   Hemoglobin A1c    Meds ordered this encounter  Medications   citalopram  (CELEXA ) 20 MG tablet    Sig: Take 1 tablet (20 mg total) by mouth daily.    Dispense:  90 tablet    Refill:  1    Dose change - 07/09/24   metoprolol  tartrate (LOPRESSOR ) 50 MG tablet    Sig: Take 1 tablet (50 mg total) by mouth 2 (two) times daily.    Dispense:  180 tablet    Refill:  3   losartan  (COZAAR ) 50 MG tablet    Sig: Take 1 tablet (50 mg total) by mouth daily.    Dispense:  90 tablet    Refill:  3   pantoprazole  (PROTONIX ) 40 MG tablet    Sig: Take 1 tablet (40 mg total) by mouth daily.     Dispense:  90 tablet    Refill:  3   montelukast  (SINGULAIR ) 10 MG tablet    Sig: Take 1 tablet (10 mg total) by mouth at bedtime.    Dispense:  90 tablet    Refill:  3    Follow-up: Return in about 4 months (around 11/06/2024) for HTN and PTSD.    Suzzane MARLA Blanch, MD

## 2024-07-09 NOTE — Assessment & Plan Note (Signed)
 Well controlled with pantoprazole 40 mg QD

## 2024-07-09 NOTE — Assessment & Plan Note (Signed)
 BP Readings from Last 1 Encounters:  07/09/24 132/76   Well-controlled with losartan  50 mg QD and metoprolol  50 mg BID Counseled for compliance with the medications Advised DASH diet and moderate exercise/walking as tolerated

## 2024-07-09 NOTE — Patient Instructions (Signed)
 Please start taking Citalopram  20 mg once daily instead of 10 mg.  Please continue to take other medications as prescribed.  Please continue to follow low salt diet and ambulate as tolerated. Please use cane or walker regularly.

## 2024-07-09 NOTE — Assessment & Plan Note (Signed)
 Uncontrolled On Celexa  10 mg QD Increased dose of Celexa  to 20 mg QD

## 2024-07-09 NOTE — Assessment & Plan Note (Addendum)
 S/p stent placement in 2012 On Plavix , statin and metoprolol  Has nitroglycerin  as needed for chest pain Followed by cardiology

## 2024-07-10 ENCOUNTER — Ambulatory Visit: Payer: Self-pay | Admitting: Internal Medicine

## 2024-07-10 LAB — CBC WITH DIFFERENTIAL/PLATELET
Basophils Absolute: 0.1 x10E3/uL (ref 0.0–0.2)
Basos: 1 %
EOS (ABSOLUTE): 0.7 x10E3/uL — ABNORMAL HIGH (ref 0.0–0.4)
Eos: 4 %
Hematocrit: 43 % (ref 37.5–51.0)
Hemoglobin: 14.1 g/dL (ref 13.0–17.7)
Immature Grans (Abs): 0 x10E3/uL (ref 0.0–0.1)
Immature Granulocytes: 0 %
Lymphocytes Absolute: 11 x10E3/uL — ABNORMAL HIGH (ref 0.7–3.1)
Lymphs: 58 %
MCH: 31.7 pg (ref 26.6–33.0)
MCHC: 32.8 g/dL (ref 31.5–35.7)
MCV: 97 fL (ref 79–97)
Monocytes Absolute: 1.3 x10E3/uL — ABNORMAL HIGH (ref 0.1–0.9)
Monocytes: 7 %
Neutrophils Absolute: 5.6 x10E3/uL (ref 1.4–7.0)
Neutrophils: 30 %
Platelets: 340 x10E3/uL (ref 150–450)
RBC: 4.45 x10E6/uL (ref 4.14–5.80)
RDW: 11.8 % (ref 11.6–15.4)
WBC: 18.7 x10E3/uL — ABNORMAL HIGH (ref 3.4–10.8)

## 2024-07-10 LAB — TSH: TSH: 2.19 u[IU]/mL (ref 0.450–4.500)

## 2024-07-10 LAB — CMP14+EGFR
ALT: 22 IU/L (ref 0–44)
AST: 22 IU/L (ref 0–40)
Albumin: 4.1 g/dL (ref 3.7–4.7)
Alkaline Phosphatase: 76 IU/L (ref 48–129)
BUN/Creatinine Ratio: 11 (ref 10–24)
BUN: 12 mg/dL (ref 8–27)
Bilirubin Total: 0.9 mg/dL (ref 0.0–1.2)
CO2: 23 mmol/L (ref 20–29)
Calcium: 9.3 mg/dL (ref 8.6–10.2)
Chloride: 106 mmol/L (ref 96–106)
Creatinine, Ser: 1.12 mg/dL (ref 0.76–1.27)
Globulin, Total: 1.8 g/dL (ref 1.5–4.5)
Glucose: 90 mg/dL (ref 70–99)
Potassium: 4.4 mmol/L (ref 3.5–5.2)
Sodium: 140 mmol/L (ref 134–144)
Total Protein: 5.9 g/dL — ABNORMAL LOW (ref 6.0–8.5)
eGFR: 64 mL/min/1.73 (ref >59)

## 2024-07-10 LAB — LIPID PANEL
Chol/HDL Ratio: 2.2 ratio (ref 0.0–5.0)
Cholesterol, Total: 130 mg/dL (ref 100–199)
HDL: 58 mg/dL (ref >39)
LDL Chol Calc (NIH): 57 mg/dL (ref 0–99)
Triglycerides: 75 mg/dL (ref 0–149)
VLDL Cholesterol Cal: 15 mg/dL (ref 5–40)

## 2024-07-10 LAB — HEMOGLOBIN A1C
Est. average glucose Bld gHb Est-mCnc: 108 mg/dL
Hgb A1c MFr Bld: 5.4 % (ref 4.8–5.6)

## 2024-07-10 LAB — VITAMIN D 25 HYDROXY (VIT D DEFICIENCY, FRACTURES): Vit D, 25-Hydroxy: 46.3 ng/mL (ref 30.0–100.0)

## 2024-07-10 MED ORDER — ALBUTEROL SULFATE HFA 108 (90 BASE) MCG/ACT IN AERS: 2.0000 | INHALATION_SPRAY | Freq: Four times a day (QID) | RESPIRATORY_TRACT | 6 refills | Status: AC | PRN

## 2024-08-13 ENCOUNTER — Encounter (HOSPITAL_COMMUNITY): Payer: Self-pay | Admitting: Hematology & Oncology

## 2024-08-19 NOTE — Progress Notes (Unsigned)
 "  Established Patient Pulmonology Office Visit   Subjective:  Patient ID: David Pugh., male    DOB: 10-09-1934  MRN: [id number]  CC: No chief complaint on file.   HPI  David Pugh. is a 89 y.o. male with PMH significant for GERD, HTN, Colon Cancer s/p collectomy, CAD, CLL who presents for follow up of  chronic cough.   Last seen on 04/2024, thought cough was related to GERD. Advised GERD lifestyle modifications and swapped PPI to H2 blocker. Also, ordered CXR and PFT.    {PULM QUESTIONNAIRES (Optional):33196}  ROS  {History (Optional):23778} Current Medications[1]      Objective:  There were no vitals taken for this visit. {Pulm Vitals (Optional):32837}  Physical Exam   Diagnostic Review:  {Labs (Optional):32838}  CXR 04/2024: linear atelectasis in RLL, otherwise normal  PFT 06/2024: mild obstructive defect, + bronchodilator response, likely due to asthma.  Absolute Eos Count ranges from 300 - 1300 cells/uL between 2022 to 2025.    Assessment & Plan:   Assessment & Plan Gastroesophageal reflux disease, unspecified whether esophagitis present  Chronic cough   No orders of the defined types were placed in this encounter.     No follow-ups on file.   Marlin Brys, MD     [1]  Current Outpatient Medications:    acetaminophen  (TYLENOL ) 500 MG tablet, Take 500 mg by mouth every 6 (six) hours as needed for mild pain (pain score 1-3)., Disp: , Rfl:    albuterol  (VENTOLIN  HFA) 108 (90 Base) MCG/ACT inhaler, Inhale 2 puffs into the lungs every 6 (six) hours as needed for wheezing or shortness of breath., Disp: 8 g, Rfl: 6   Apoaequorin (PREVAGEN EXTRA STRENGTH) 20 MG CAPS, Take 1 capsule by mouth daily., Disp: , Rfl:    Azelastine -Fluticasone  137-50 MCG/ACT SUSP, Place 1 spray into the nose every 12 (twelve) hours., Disp: 23 g, Rfl: 1   B Complex Vitamins (VITAMIN B COMPLEX  PO), Take 1 tablet by mouth at bedtime. , Disp: , Rfl:    cetirizine   (ZYRTEC ) 10 MG chewable tablet, Chew 1 tablet (10 mg total) by mouth daily., Disp: 90 tablet, Rfl: 3   cholecalciferol  (VITAMIN D3) 25 MCG (1000 UNIT) tablet, Take 1,000 Units by mouth 2 (two) times daily., Disp: , Rfl:    citalopram  (CELEXA ) 20 MG tablet, Take 1 tablet (20 mg total) by mouth daily., Disp: 90 tablet, Rfl: 1   clopidogrel  (PLAVIX ) 75 MG tablet, take 1 tablet by mouth once daily., Disp: 30 tablet, Rfl: 11   famotidine  (PEPCID ) 20 MG tablet, Take 1 tablet (20 mg total) by mouth 2 (two) times daily., Disp: 180 tablet, Rfl: 6   folic acid  (FOLVITE ) 1 MG tablet, TAKE (1) TABLET BY MOUTH ONCE DAILY ., Disp: 30 tablet, Rfl: 0   latanoprost  (XALATAN ) 0.005 % ophthalmic solution, 1 DROP IN EACH EYE AT BEDTIME., Disp: 2.5 mL, Rfl: 12   losartan  (COZAAR ) 50 MG tablet, Take 1 tablet (50 mg total) by mouth daily., Disp: 90 tablet, Rfl: 3   memantine  (NAMENDA ) 10 MG tablet, Take 1 tablet (10 mg total) by mouth 2 (two) times daily., Disp: 180 tablet, Rfl: 3   metoprolol  tartrate (LOPRESSOR ) 50 MG tablet, Take 1 tablet (50 mg total) by mouth 2 (two) times daily., Disp: 180 tablet, Rfl: 3   mirabegron  ER (MYRBETRIQ ) 25 MG TB24 tablet, Take 1 tablet (25 mg total) by mouth daily., Disp: 30 tablet, Rfl: 11   montelukast  (SINGULAIR ) 10 MG  tablet, Take 1 tablet (10 mg total) by mouth at bedtime., Disp: 90 tablet, Rfl: 3   Multiple Vitamin (MULTIVITAMIN WITH MINERALS) TABS tablet, Take 1 tablet by mouth daily., Disp: , Rfl:    nitroGLYCERIN  (NITROSTAT ) 0.4 MG SL tablet, Place 1 tablet (0.4 mg total) under the tongue every 5 (five) minutes x 3 doses as needed for chest pain (if no relief after 3rd dose, proceed to ED or call 911)., Disp: 25 tablet, Rfl: 3   pantoprazole  (PROTONIX ) 40 MG tablet, Take 1 tablet (40 mg total) by mouth daily., Disp: 90 tablet, Rfl: 3   pravastatin  (PRAVACHOL ) 40 MG tablet, Take 1 tablet (40 mg total) by mouth at bedtime., Disp: 90 tablet, Rfl: 3   rivastigmine  (EXELON ) 1.5 MG  capsule, Take 1 capsule (1.5 mg total) by mouth 2 (two) times daily., Disp: 180 capsule, Rfl: 3   vitamin B-12 (CYANOCOBALAMIN ) 1000 MCG tablet, Take 1,000 mcg by mouth daily., Disp: , Rfl:   "

## 2024-08-20 ENCOUNTER — Encounter: Payer: Self-pay | Admitting: Pulmonary Disease

## 2024-08-20 ENCOUNTER — Encounter (HOSPITAL_COMMUNITY): Payer: Self-pay | Admitting: Hematology & Oncology

## 2024-08-20 ENCOUNTER — Ambulatory Visit: Admitting: Pulmonary Disease

## 2024-08-20 VITALS — BP 144/72 | HR 50 | Ht 70.5 in | Wt 205.0 lb

## 2024-08-20 DIAGNOSIS — J4530 Mild persistent asthma, uncomplicated: Secondary | ICD-10-CM

## 2024-08-20 DIAGNOSIS — R053 Chronic cough: Secondary | ICD-10-CM

## 2024-08-20 DIAGNOSIS — K219 Gastro-esophageal reflux disease without esophagitis: Secondary | ICD-10-CM

## 2024-08-20 MED ORDER — BUDESONIDE-FORMOTEROL FUMARATE 160-4.5 MCG/ACT IN AERO: 2.0000 | INHALATION_SPRAY | Freq: Two times a day (BID) | RESPIRATORY_TRACT | 6 refills | Status: AC

## 2024-08-20 NOTE — Patient Instructions (Signed)
" °  VISIT SUMMARY: During your visit, we discussed your chronic cough, which is likely due to mild persistent asthma. We reviewed your recent breathing test and chest x-ray results, and you were prescribed a new inhaler to help manage your symptoms.  YOUR PLAN: CHRONIC COUGH DUE TO MILD PERSISTENT ASTHMA: Your chronic cough is likely due to mild persistent asthma, which has shown improvement with the use of an albuterol  inhaler. -Start using the Symbicort  inhaler: 2 puffs in the morning and 2 puffs in the evening. -Rinse your mouth after using Symbicort  to prevent thrush. -Continue using the albuterol  inhaler as needed for shortness of breath or cough outside of Symbicort  use. -Address any potential mold exposure at home, as it may worsen your symptoms. -Schedule a follow-up appointment in 3-4 months to assess your response to the treatment.  Contains text generated by [employer] "

## 2024-08-20 NOTE — Assessment & Plan Note (Signed)
-   Continue anti-reflux medications as prescribed by PCP.

## 2024-09-11 ENCOUNTER — Inpatient Hospital Stay

## 2024-09-11 DIAGNOSIS — Z8042 Family history of malignant neoplasm of prostate: Secondary | ICD-10-CM | POA: Diagnosis not present

## 2024-09-11 DIAGNOSIS — Z800 Family history of malignant neoplasm of digestive organs: Secondary | ICD-10-CM | POA: Insufficient documentation

## 2024-09-11 DIAGNOSIS — Z923 Personal history of irradiation: Secondary | ICD-10-CM | POA: Insufficient documentation

## 2024-09-11 DIAGNOSIS — N39498 Other specified urinary incontinence: Secondary | ICD-10-CM | POA: Insufficient documentation

## 2024-09-11 DIAGNOSIS — R5383 Other fatigue: Secondary | ICD-10-CM | POA: Diagnosis not present

## 2024-09-11 DIAGNOSIS — C9110 Chronic lymphocytic leukemia of B-cell type not having achieved remission: Secondary | ICD-10-CM | POA: Diagnosis present

## 2024-09-11 DIAGNOSIS — Z9221 Personal history of antineoplastic chemotherapy: Secondary | ICD-10-CM | POA: Insufficient documentation

## 2024-09-11 DIAGNOSIS — Z8719 Personal history of other diseases of the digestive system: Secondary | ICD-10-CM | POA: Insufficient documentation

## 2024-09-11 DIAGNOSIS — Z8249 Family history of ischemic heart disease and other diseases of the circulatory system: Secondary | ICD-10-CM | POA: Diagnosis not present

## 2024-09-11 DIAGNOSIS — Z8546 Personal history of malignant neoplasm of prostate: Secondary | ICD-10-CM | POA: Diagnosis not present

## 2024-09-11 DIAGNOSIS — R059 Cough, unspecified: Secondary | ICD-10-CM | POA: Diagnosis not present

## 2024-09-11 DIAGNOSIS — Z9049 Acquired absence of other specified parts of digestive tract: Secondary | ICD-10-CM | POA: Diagnosis not present

## 2024-09-11 DIAGNOSIS — I2510 Atherosclerotic heart disease of native coronary artery without angina pectoris: Secondary | ICD-10-CM | POA: Diagnosis not present

## 2024-09-11 DIAGNOSIS — G629 Polyneuropathy, unspecified: Secondary | ICD-10-CM | POA: Diagnosis not present

## 2024-09-11 DIAGNOSIS — Z85038 Personal history of other malignant neoplasm of large intestine: Secondary | ICD-10-CM | POA: Diagnosis not present

## 2024-09-11 DIAGNOSIS — Z801 Family history of malignant neoplasm of trachea, bronchus and lung: Secondary | ICD-10-CM | POA: Insufficient documentation

## 2024-09-11 LAB — CBC WITH DIFFERENTIAL/PLATELET
Abs Immature Granulocytes: 0.07 K/uL (ref 0.00–0.07)
Basophils Absolute: 0.1 K/uL (ref 0.0–0.1)
Basophils Relative: 1 %
Eosinophils Absolute: 0.4 K/uL (ref 0.0–0.5)
Eosinophils Relative: 2 %
HCT: 44.3 % (ref 39.0–52.0)
Hemoglobin: 14.3 g/dL (ref 13.0–17.0)
Immature Granulocytes: 0 %
Lymphocytes Relative: 54 %
Lymphs Abs: 9.4 K/uL — ABNORMAL HIGH (ref 0.7–4.0)
MCH: 31 pg (ref 26.0–34.0)
MCHC: 32.3 g/dL (ref 30.0–36.0)
MCV: 95.9 fL (ref 80.0–100.0)
Monocytes Absolute: 1.4 K/uL — ABNORMAL HIGH (ref 0.1–1.0)
Monocytes Relative: 8 %
Neutro Abs: 6.1 K/uL (ref 1.7–7.7)
Neutrophils Relative %: 35 %
Platelets: 358 K/uL (ref 150–400)
RBC: 4.62 MIL/uL (ref 4.22–5.81)
RDW: 12.4 % (ref 11.5–15.5)
Smear Review: NORMAL
WBC: 17.3 K/uL — ABNORMAL HIGH (ref 4.0–10.5)
nRBC: 0 % (ref 0.0–0.2)

## 2024-09-11 LAB — COMPREHENSIVE METABOLIC PANEL WITH GFR
ALT: 27 U/L (ref 0–44)
AST: 27 U/L (ref 15–41)
Albumin: 3.8 g/dL (ref 3.5–5.0)
Alkaline Phosphatase: 78 U/L (ref 38–126)
Anion gap: 9 (ref 5–15)
BUN: 13 mg/dL (ref 8–23)
CO2: 25 mmol/L (ref 22–32)
Calcium: 8.7 mg/dL — ABNORMAL LOW (ref 8.9–10.3)
Chloride: 108 mmol/L (ref 98–111)
Creatinine, Ser: 1.01 mg/dL (ref 0.61–1.24)
GFR, Estimated: >60 mL/min
Glucose, Bld: 75 mg/dL (ref 70–99)
Potassium: 4.6 mmol/L (ref 3.5–5.1)
Sodium: 142 mmol/L (ref 135–145)
Total Bilirubin: 0.6 mg/dL (ref 0.0–1.2)
Total Protein: 6.2 g/dL — ABNORMAL LOW (ref 6.5–8.1)

## 2024-09-11 LAB — LACTATE DEHYDROGENASE: LDH: 172 U/L (ref 105–235)

## 2024-09-14 ENCOUNTER — Other Ambulatory Visit: Payer: Self-pay | Admitting: Cardiology

## 2024-09-17 LAB — FISH HES LEUKEMIA, 4Q12 REA

## 2024-09-18 ENCOUNTER — Inpatient Hospital Stay: Admitting: Oncology

## 2024-09-19 ENCOUNTER — Inpatient Hospital Stay: Admitting: Oncology

## 2024-09-19 VITALS — BP 171/66 | HR 50 | Temp 99.0°F | Resp 17 | Ht 70.5 in | Wt 203.0 lb

## 2024-09-19 DIAGNOSIS — C9110 Chronic lymphocytic leukemia of B-cell type not having achieved remission: Secondary | ICD-10-CM

## 2024-09-19 NOTE — Assessment & Plan Note (Addendum)
-   He denies any fevers, night sweats or weight loss in the last 6 months. - He reports decrease in energy levels.  Reports that he is sleeping a lot during the daytime.  Also reports some chronic nighttime cough. - Physical exam did not reveal any palpable adenopathy. - Labs from 09/11/2024: LDH normal.  LFTs normal.  CBC shows normal hemoglobin and platelet count.  White count is 17.3, down from November/July 2025.  Predominantly lymphocytes.  FISH panel was unremarkable. - No indication for treatment at this time.  Recommend follow-up in 6 months with repeat exam and labs.

## 2024-09-19 NOTE — Progress Notes (Signed)
 "  [facility] OFFICE PROGRESS NOTE  Tobie Suzzane POUR, MD  ASSESSMENT & PLAN:    Assessment & Plan CLL (chronic lymphocytic leukemia) (HCC) - He denies any fevers, night sweats or weight loss in the last 6 months. - He reports decrease in energy levels.  Reports that he is sleeping a lot during the daytime.  Also reports some chronic nighttime cough. - Physical exam did not reveal any palpable adenopathy. - Labs from 09/11/2024: LDH normal.  LFTs normal.  CBC shows normal hemoglobin and platelet count.  White count is 17.3, down from November/July 2025.  Predominantly lymphocytes.  FISH panel was unremarkable. - No indication for treatment at this time.  Recommend follow-up in 6 months with repeat exam and labs.    Orders Placed This Encounter  Procedures   Lactate dehydrogenase    Standing Status:   Future    Expected Date:   03/14/2025    Expiration Date:   06/18/2025   Comprehensive metabolic panel    Standing Status:   Future    Expected Date:   03/14/2025    Expiration Date:   06/18/2025   CBC with Differential    Standing Status:   Future    Expected Date:   03/14/2025    Expiration Date:   06/18/2025   Chronic Lymphocytic Leukemia (CLL) Profile, FISH    Standing Status:   Future    Expected Date:   03/14/2025    Expiration Date:   06/18/2025    INTERVAL HISTORY: Patient returns for follow-up.  He is here today with his wife.  Reports he was seen at the [facility] and was found to have a white blood cell count of 18.86 which was concerning to his provider and wanted to let us  know.  Remainder of his CBC was unremarkable.  CMP was unremarkable.  Reports he is overall doing well.  Appetite is 100% energy levels are 50%.  Denies any pain.  Reports frequent naps throughout the day and sleeping well at night.  Denies any B symptoms.  He has a cough that is worse at night especially when he is lying down.  Wife refers to it as croup cough.  He has occasional urinary  incontinence and numbness in his feet and hands.  Denies any recent hospitalizations, surgeries or changes to baseline health.  Patient did have follow-up with pulmonary and cardiology since he was seen by us .  Chronic cough likely multifactorial due to GERD, allergic rhinitis and mild persistent asthma.  He was asked to continue montelukast , Protonix  and nasal spray.  A ICS/LABA was added for his asthma.  He was seen by his PCP for chronic comorbidities and cardiology Dr. Debera for CAD, hyperlipidemia and hypertension.  Exam appears stable.  We reviewed LDH, CMP, CBC and CLL panel.  SUMMARY OF HEMATOLOGIC HISTORY: Oncology History  Cancer of ascending colon (HCC)  02/29/2012 Procedure   laparoscopic assisted R colectomy   03/09/2012 Miscellaneous   Iron  deficiency and folic acid  deficiency   03/29/2012 Initial Diagnosis   Cancer of ascending colon, CEA on 01/19/2012 was WNL   08/02/2013 Procedure   Ileocolonoscopy with snare polypectomy   06/11/2014 Imaging   CT abdomen/pelvis with no findings to suggest metastatic disease. Diverticulosis   06/11/2016 Imaging   CT Abdomen/Pelvis with no findings to suggest recurrent or metastatic disease. Prostatomegaly.      1.  Stage 0 CLL: - He was diagnosed by flow on 12/13/2014   2.  Stage  III ascending colon cancer: - He had 2 out of 20 lymph nodes positive. -Status post right hemicolectomy on 02/29/2012, could not tolerate adjuvant Xeloda . - EGD showed multiple benign gastric polyps, otherwise within normal limits. -He is completed 5 years of surveillance.  No more scans needed at this time. -He had a colonoscopy on 11/10/2017 which showed radiation proctitis changes.  Diverticulosis in the sigmoid colon.  One polyp in the ascending colon which was removed   3.  Prostate cancer: -Underwent radiation therapy at [facility] in 2011. -He also underwent a TURP x2. - He follows up with Dr. Shona his urologist in [facility]. CBC     Component Value Date/Time   WBC 17.3 (H) 09/11/2024 1315   RBC 4.62 09/11/2024 1315   HGB 14.3 09/11/2024 1315   HGB 14.1 07/09/2024 1116   HCT 44.3 09/11/2024 1315   HCT 43.0 07/09/2024 1116   PLT 358 09/11/2024 1315   PLT 340 07/09/2024 1116   MCV 95.9 09/11/2024 1315   MCV 97 07/09/2024 1116   MCH 31.0 09/11/2024 1315   MCHC 32.3 09/11/2024 1315   RDW 12.4 09/11/2024 1315   RDW 11.8 07/09/2024 1116   LYMPHSABS 9.4 (H) 09/11/2024 1315   LYMPHSABS 11.0 (H) 07/09/2024 1116   MONOABS 1.4 (H) 09/11/2024 1315   EOSABS 0.4 09/11/2024 1315   EOSABS 0.7 (H) 07/09/2024 1116   BASOSABS 0.1 09/11/2024 1315   BASOSABS 0.1 07/09/2024 1116       Latest Ref Rng & Units 09/11/2024    1:15 PM 07/09/2024   11:16 AM 03/12/2024    2:17 PM  CMP  Glucose 70 - 99 mg/dL 75  90  884   BUN 8 - 23 mg/dL 13  12  14    Creatinine 0.61 - 1.24 mg/dL 8.98  8.87  8.78   Sodium 135 - 145 mmol/L 142  140  146   Potassium 3.5 - 5.1 mmol/L 4.6  4.4  4.5   Chloride 98 - 111 mmol/L 108  106  111   CO2 22 - 32 mmol/L 25  23  25    Calcium 8.9 - 10.3 mg/dL 8.7  9.3  9.3   Total Protein 6.5 - 8.1 g/dL 6.2  5.9  6.7   Total Bilirubin 0.0 - 1.2 mg/dL 0.6  0.9  0.9   Alkaline Phos 38 - 126 U/L 78  76  65   AST 15 - 41 U/L 27  22  25    ALT 0 - 44 U/L 27  22  28       Lab Results  Component Value Date   FERRITIN 67 01/13/2021   VITAMINB12 1,536 (H) 07/15/2023    Vitals:   09/19/24 1352 09/19/24 1354  BP: (!) 169/60 (!) 171/66  Pulse: (!) 50   Resp: 17   Temp: 99 F (37.2 C)   SpO2: 99%     Review of System:  Review of Systems  Constitutional:  Positive for malaise/fatigue.  Respiratory:  Positive for cough.   Genitourinary:  Positive for frequency.       Incontinence at times  Neurological:  Positive for sensory change.    Physical Exam: Physical Exam Constitutional:      Appearance: Normal appearance.  HENT:     Head: Normocephalic and atraumatic.  Eyes:     Pupils: Pupils are equal,  round, and reactive to light.  Cardiovascular:     Rate and Rhythm: Normal rate and regular rhythm.  Heart sounds: Normal heart sounds. No murmur heard. Pulmonary:     Effort: Pulmonary effort is normal.     Breath sounds: Normal breath sounds. No wheezing.  Abdominal:     General: Bowel sounds are normal. There is no distension.     Palpations: Abdomen is soft.     Tenderness: There is no abdominal tenderness.  Musculoskeletal:        General: Normal range of motion.     Cervical back: Normal range of motion.  Skin:    General: Skin is warm and dry.     Findings: No rash.  Neurological:     Mental Status: He is alert and oriented to person, place, and time.     Gait: Gait is intact.  Psychiatric:        Mood and Affect: Mood and affect normal.        Cognition and Memory: Memory normal.        Judgment: Judgment normal.      I spent 25 minutes dedicated to the care of this patient (face-to-face and non-face-to-face) on the date of the encounter to include what is described in the assessment and plan.,  Delon Hope, NP 09/19/2024 3:09 PM "

## 2024-11-08 ENCOUNTER — Ambulatory Visit: Admitting: Internal Medicine

## 2024-11-19 ENCOUNTER — Ambulatory Visit: Admitting: Cardiology

## 2024-12-04 ENCOUNTER — Ambulatory Visit: Admitting: Pulmonary Disease

## 2025-03-11 ENCOUNTER — Other Ambulatory Visit

## 2025-03-19 ENCOUNTER — Inpatient Hospital Stay

## 2025-03-20 ENCOUNTER — Ambulatory Visit: Admitting: Urology

## 2025-03-20 ENCOUNTER — Inpatient Hospital Stay

## 2025-03-26 ENCOUNTER — Inpatient Hospital Stay: Admitting: Oncology
